# Patient Record
Sex: Male | Born: 1970 | Race: White | Hispanic: No | Marital: Single | Smoking: Never smoker
Health system: Southern US, Community
[De-identification: ages and names within clinical notes are randomized; demographics above are authoritative.]

## PROBLEM LIST (undated history)

## (undated) DIAGNOSIS — I10 Essential (primary) hypertension: Secondary | ICD-10-CM

## (undated) DIAGNOSIS — I2510 Atherosclerotic heart disease of native coronary artery without angina pectoris: Secondary | ICD-10-CM

## (undated) DIAGNOSIS — E119 Type 2 diabetes mellitus without complications: Secondary | ICD-10-CM

## (undated) DIAGNOSIS — F32A Depression, unspecified: Secondary | ICD-10-CM

## (undated) DIAGNOSIS — I509 Heart failure, unspecified: Secondary | ICD-10-CM

## (undated) DIAGNOSIS — F419 Anxiety disorder, unspecified: Secondary | ICD-10-CM

---

## 1999-02-06 ENCOUNTER — Emergency Department (HOSPITAL_COMMUNITY): Admission: EM | Admit: 1999-02-06 | Discharge: 1999-02-06 | Payer: Self-pay | Admitting: Emergency Medicine

## 1999-02-08 ENCOUNTER — Encounter: Payer: Self-pay | Admitting: Emergency Medicine

## 1999-02-08 ENCOUNTER — Emergency Department (HOSPITAL_COMMUNITY): Admission: EM | Admit: 1999-02-08 | Discharge: 1999-02-08 | Payer: Self-pay | Admitting: Emergency Medicine

## 1999-03-20 ENCOUNTER — Emergency Department (HOSPITAL_COMMUNITY): Admission: EM | Admit: 1999-03-20 | Discharge: 1999-03-20 | Payer: Self-pay | Admitting: Emergency Medicine

## 1999-07-08 ENCOUNTER — Emergency Department (HOSPITAL_COMMUNITY): Admission: EM | Admit: 1999-07-08 | Discharge: 1999-07-09 | Payer: Self-pay | Admitting: Emergency Medicine

## 1999-08-06 ENCOUNTER — Emergency Department (HOSPITAL_COMMUNITY): Admission: EM | Admit: 1999-08-06 | Discharge: 1999-08-06 | Payer: Self-pay | Admitting: Emergency Medicine

## 2000-12-22 ENCOUNTER — Encounter: Payer: Self-pay | Admitting: Emergency Medicine

## 2000-12-22 ENCOUNTER — Emergency Department (HOSPITAL_COMMUNITY): Admission: EM | Admit: 2000-12-22 | Discharge: 2000-12-22 | Payer: Self-pay | Admitting: *Deleted

## 2001-09-03 ENCOUNTER — Emergency Department (HOSPITAL_COMMUNITY): Admission: EM | Admit: 2001-09-03 | Discharge: 2001-09-03 | Payer: Self-pay

## 2001-11-23 ENCOUNTER — Emergency Department (HOSPITAL_COMMUNITY): Admission: EM | Admit: 2001-11-23 | Discharge: 2001-11-23 | Payer: Self-pay | Admitting: Emergency Medicine

## 2001-11-23 ENCOUNTER — Encounter: Payer: Self-pay | Admitting: Emergency Medicine

## 2002-04-25 ENCOUNTER — Emergency Department (HOSPITAL_COMMUNITY): Admission: EM | Admit: 2002-04-25 | Discharge: 2002-04-25 | Payer: Self-pay | Admitting: Emergency Medicine

## 2002-04-25 ENCOUNTER — Encounter: Payer: Self-pay | Admitting: Emergency Medicine

## 2002-07-26 ENCOUNTER — Emergency Department (HOSPITAL_COMMUNITY): Admission: EM | Admit: 2002-07-26 | Discharge: 2002-07-26 | Payer: Self-pay | Admitting: Emergency Medicine

## 2002-09-09 ENCOUNTER — Emergency Department (HOSPITAL_COMMUNITY): Admission: EM | Admit: 2002-09-09 | Discharge: 2002-09-09 | Payer: Self-pay | Admitting: Emergency Medicine

## 2002-09-09 ENCOUNTER — Encounter: Payer: Self-pay | Admitting: Emergency Medicine

## 2004-04-18 ENCOUNTER — Emergency Department (HOSPITAL_COMMUNITY): Admission: EM | Admit: 2004-04-18 | Discharge: 2004-04-18 | Payer: Self-pay | Admitting: *Deleted

## 2005-12-01 ENCOUNTER — Emergency Department (HOSPITAL_COMMUNITY): Admission: EM | Admit: 2005-12-01 | Discharge: 2005-12-01 | Payer: Self-pay | Admitting: Emergency Medicine

## 2006-06-21 ENCOUNTER — Emergency Department (HOSPITAL_COMMUNITY): Admission: EM | Admit: 2006-06-21 | Discharge: 2006-06-21 | Payer: Self-pay | Admitting: *Deleted

## 2006-06-26 ENCOUNTER — Emergency Department (HOSPITAL_COMMUNITY): Admission: EM | Admit: 2006-06-26 | Discharge: 2006-06-26 | Payer: Self-pay | Admitting: Emergency Medicine

## 2006-07-01 ENCOUNTER — Emergency Department (HOSPITAL_COMMUNITY): Admission: EM | Admit: 2006-07-01 | Discharge: 2006-07-02 | Payer: Self-pay | Admitting: Emergency Medicine

## 2006-10-08 ENCOUNTER — Emergency Department (HOSPITAL_COMMUNITY): Admission: EM | Admit: 2006-10-08 | Discharge: 2006-10-08 | Payer: Self-pay | Admitting: Emergency Medicine

## 2007-04-24 ENCOUNTER — Emergency Department (HOSPITAL_COMMUNITY): Admission: EM | Admit: 2007-04-24 | Discharge: 2007-04-24 | Payer: Self-pay | Admitting: *Deleted

## 2007-08-24 ENCOUNTER — Emergency Department (HOSPITAL_COMMUNITY): Admission: EM | Admit: 2007-08-24 | Discharge: 2007-08-24 | Payer: Self-pay | Admitting: Emergency Medicine

## 2007-08-31 ENCOUNTER — Emergency Department (HOSPITAL_COMMUNITY): Admission: EM | Admit: 2007-08-31 | Discharge: 2007-08-31 | Payer: Self-pay | Admitting: Emergency Medicine

## 2008-10-03 ENCOUNTER — Emergency Department (HOSPITAL_COMMUNITY): Admission: EM | Admit: 2008-10-03 | Discharge: 2008-10-03 | Payer: Self-pay | Admitting: Emergency Medicine

## 2008-10-08 ENCOUNTER — Emergency Department (HOSPITAL_COMMUNITY): Admission: EM | Admit: 2008-10-08 | Discharge: 2008-10-08 | Payer: Self-pay | Admitting: Emergency Medicine

## 2008-10-21 ENCOUNTER — Emergency Department (HOSPITAL_COMMUNITY): Admission: EM | Admit: 2008-10-21 | Discharge: 2008-10-21 | Payer: Self-pay | Admitting: Emergency Medicine

## 2008-11-11 ENCOUNTER — Emergency Department (HOSPITAL_COMMUNITY): Admission: EM | Admit: 2008-11-11 | Discharge: 2008-11-11 | Payer: Self-pay | Admitting: Emergency Medicine

## 2008-11-18 ENCOUNTER — Emergency Department (HOSPITAL_COMMUNITY): Admission: EM | Admit: 2008-11-18 | Discharge: 2008-11-19 | Payer: Self-pay | Admitting: Emergency Medicine

## 2009-04-03 ENCOUNTER — Emergency Department (HOSPITAL_COMMUNITY): Admission: EM | Admit: 2009-04-03 | Discharge: 2009-04-03 | Payer: Self-pay | Admitting: Emergency Medicine

## 2009-05-16 ENCOUNTER — Emergency Department (HOSPITAL_COMMUNITY): Admission: EM | Admit: 2009-05-16 | Discharge: 2009-05-17 | Payer: Self-pay | Admitting: Emergency Medicine

## 2009-06-22 ENCOUNTER — Emergency Department (HOSPITAL_COMMUNITY): Admission: EM | Admit: 2009-06-22 | Discharge: 2009-06-22 | Payer: Self-pay | Admitting: Emergency Medicine

## 2009-06-23 ENCOUNTER — Emergency Department (HOSPITAL_COMMUNITY): Admission: EM | Admit: 2009-06-23 | Discharge: 2009-06-23 | Payer: Self-pay | Admitting: Emergency Medicine

## 2009-07-22 ENCOUNTER — Emergency Department (HOSPITAL_COMMUNITY): Admission: EM | Admit: 2009-07-22 | Discharge: 2009-07-22 | Payer: Self-pay | Admitting: Emergency Medicine

## 2009-08-02 ENCOUNTER — Inpatient Hospital Stay (HOSPITAL_COMMUNITY): Admission: EM | Admit: 2009-08-02 | Discharge: 2009-08-05 | Payer: Self-pay | Admitting: Emergency Medicine

## 2009-08-03 ENCOUNTER — Encounter (INDEPENDENT_AMBULATORY_CARE_PROVIDER_SITE_OTHER): Payer: Self-pay | Admitting: Family Medicine

## 2009-09-07 ENCOUNTER — Ambulatory Visit: Payer: Self-pay | Admitting: Family Medicine

## 2009-09-07 DIAGNOSIS — F411 Generalized anxiety disorder: Secondary | ICD-10-CM

## 2009-09-07 DIAGNOSIS — F329 Major depressive disorder, single episode, unspecified: Secondary | ICD-10-CM

## 2009-09-07 DIAGNOSIS — F419 Anxiety disorder, unspecified: Secondary | ICD-10-CM | POA: Insufficient documentation

## 2009-09-07 DIAGNOSIS — E1159 Type 2 diabetes mellitus with other circulatory complications: Secondary | ICD-10-CM | POA: Insufficient documentation

## 2009-09-07 DIAGNOSIS — J45909 Unspecified asthma, uncomplicated: Secondary | ICD-10-CM | POA: Insufficient documentation

## 2009-09-07 DIAGNOSIS — E876 Hypokalemia: Secondary | ICD-10-CM | POA: Insufficient documentation

## 2009-09-07 DIAGNOSIS — F32A Depression, unspecified: Secondary | ICD-10-CM | POA: Insufficient documentation

## 2009-09-07 DIAGNOSIS — E119 Type 2 diabetes mellitus without complications: Secondary | ICD-10-CM

## 2009-09-07 DIAGNOSIS — I10 Essential (primary) hypertension: Secondary | ICD-10-CM | POA: Insufficient documentation

## 2009-09-07 DIAGNOSIS — R197 Diarrhea, unspecified: Secondary | ICD-10-CM | POA: Insufficient documentation

## 2009-09-21 ENCOUNTER — Ambulatory Visit: Payer: Self-pay | Admitting: Family Medicine

## 2009-09-21 DIAGNOSIS — I517 Cardiomegaly: Secondary | ICD-10-CM

## 2009-09-21 LAB — CONVERTED CEMR LAB
AST: 16 units/L (ref 0–37)
BUN: 11 mg/dL (ref 6–23)
Cholesterol: 199 mg/dL (ref 0–200)
Creatinine, Ser: 0.84 mg/dL (ref 0.40–1.50)
Eosinophils Relative: 2 % (ref 0–5)
Glucose, Bld: 257 mg/dL — ABNORMAL HIGH (ref 70–99)
HCT: 48.4 % (ref 39.0–52.0)
Hemoglobin: 16.4 g/dL (ref 13.0–17.0)
LDL Cholesterol: 87 mg/dL (ref 0–99)
Lymphocytes Relative: 38 % (ref 12–46)
MCHC: 33.9 g/dL (ref 30.0–36.0)
MCV: 91.7 fL (ref 78.0–100.0)
Neutrophils Relative %: 54 % (ref 43–77)
Platelets: 290 10*3/uL (ref 150–400)
Potassium: 4.2 meq/L (ref 3.5–5.3)
RBC: 5.28 M/uL (ref 4.22–5.81)
Sodium: 138 meq/L (ref 135–145)
TSH: 1.787 microintl units/mL (ref 0.350–4.500)
Total Bilirubin: 0.6 mg/dL (ref 0.3–1.2)
Total CHOL/HDL Ratio: 5.5
Triglycerides: 382 mg/dL — ABNORMAL HIGH (ref <150)
VLDL: 76 mg/dL — ABNORMAL HIGH (ref 0–40)
Vit D, 25-Hydroxy: 15 ng/mL — ABNORMAL LOW (ref 30–89)

## 2009-09-22 ENCOUNTER — Ambulatory Visit: Payer: Self-pay | Admitting: Family Medicine

## 2009-09-24 ENCOUNTER — Ambulatory Visit: Payer: Self-pay | Admitting: Family Medicine

## 2009-09-28 ENCOUNTER — Encounter (INDEPENDENT_AMBULATORY_CARE_PROVIDER_SITE_OTHER): Payer: Self-pay | Admitting: Family Medicine

## 2009-10-01 ENCOUNTER — Ambulatory Visit (HOSPITAL_COMMUNITY): Admission: RE | Admit: 2009-10-01 | Discharge: 2009-10-01 | Payer: Self-pay | Admitting: Family Medicine

## 2009-10-01 ENCOUNTER — Encounter (INDEPENDENT_AMBULATORY_CARE_PROVIDER_SITE_OTHER): Payer: Self-pay | Admitting: Family Medicine

## 2009-10-26 ENCOUNTER — Emergency Department (HOSPITAL_COMMUNITY): Admission: EM | Admit: 2009-10-26 | Discharge: 2009-10-26 | Payer: Self-pay | Admitting: Emergency Medicine

## 2009-10-27 ENCOUNTER — Ambulatory Visit: Payer: Self-pay | Admitting: Family Medicine

## 2009-11-18 ENCOUNTER — Encounter (INDEPENDENT_AMBULATORY_CARE_PROVIDER_SITE_OTHER): Payer: Self-pay | Admitting: Family Medicine

## 2009-12-07 ENCOUNTER — Emergency Department (HOSPITAL_COMMUNITY): Admission: EM | Admit: 2009-12-07 | Discharge: 2009-12-07 | Payer: Self-pay | Admitting: Emergency Medicine

## 2009-12-26 ENCOUNTER — Emergency Department (HOSPITAL_COMMUNITY): Admission: EM | Admit: 2009-12-26 | Discharge: 2009-12-26 | Payer: Self-pay | Admitting: Family Medicine

## 2010-04-17 ENCOUNTER — Emergency Department (HOSPITAL_COMMUNITY): Admission: EM | Admit: 2010-04-17 | Discharge: 2010-04-18 | Payer: Self-pay | Admitting: Emergency Medicine

## 2010-04-22 ENCOUNTER — Emergency Department (HOSPITAL_COMMUNITY): Admission: EM | Admit: 2010-04-22 | Discharge: 2010-04-22 | Payer: Self-pay | Admitting: Emergency Medicine

## 2010-08-06 ENCOUNTER — Emergency Department (HOSPITAL_COMMUNITY): Admission: EM | Admit: 2010-08-06 | Discharge: 2010-08-07 | Payer: Self-pay | Admitting: Emergency Medicine

## 2010-08-16 ENCOUNTER — Emergency Department (HOSPITAL_COMMUNITY): Admission: EM | Admit: 2010-08-16 | Discharge: 2010-08-17 | Payer: Self-pay | Admitting: Emergency Medicine

## 2010-10-26 ENCOUNTER — Emergency Department (HOSPITAL_COMMUNITY)
Admission: EM | Admit: 2010-10-26 | Discharge: 2010-10-26 | Payer: Self-pay | Source: Home / Self Care | Admitting: Emergency Medicine

## 2010-10-27 LAB — COMPREHENSIVE METABOLIC PANEL
ALT: 23 U/L (ref 0–53)
AST: 18 U/L (ref 0–37)
Albumin: 3.6 g/dL (ref 3.5–5.2)
BUN: 8 mg/dL (ref 6–23)
CO2: 27 mEq/L (ref 19–32)
Chloride: 101 mEq/L (ref 96–112)
GFR calc Af Amer: >60 mL/min (ref >60)
GFR calc non Af Amer: >60 mL/min (ref >60)
Glucose, Bld: 246 mg/dL — ABNORMAL HIGH (ref 70–99)

## 2010-10-27 LAB — CBC
HCT: 41.1 % (ref 39.0–52.0)
Hemoglobin: 13.9 g/dL (ref 13.0–17.0)
RBC: 4.59 MIL/uL (ref 4.22–5.81)
WBC: 7.2 10*3/uL (ref 4.0–10.5)

## 2010-11-02 NOTE — Letter (Signed)
Summary: DISCAHRGED FROM PRACTICE//LETTER//  DISCAHRGED FROM PRACTICE//LETTER//   Imported By: Arta Bruce 12/17/2009 11:03:24  _____________________________________________________________________  External Attachment:    Type:   Image     Comment:   External Document

## 2010-11-02 NOTE — Progress Notes (Signed)
Summary: Office Visit/DEPRESSION SCREENING  Office Visit/DEPRESSION SCREENING   Imported By: Arta Bruce 10/29/2009 12:50:41  _____________________________________________________________________  External Attachment:    Type:   Image     Comment:   External Document

## 2010-11-02 NOTE — Letter (Signed)
Summary: Discharge Summary  Discharge Summary   Imported By: Arta Bruce 10/23/2009 12:55:21  _____________________________________________________________________  External Attachment:    Type:   Image     Comment:   External Document

## 2010-12-14 LAB — GLUCOSE, CAPILLARY
Glucose-Capillary: 281 mg/dL — ABNORMAL HIGH (ref 70–99)
Glucose-Capillary: 311 mg/dL — ABNORMAL HIGH (ref 70–99)

## 2010-12-14 LAB — CBC
MCH: 31.9 pg (ref 26.0–34.0)
MCHC: 34.5 g/dL (ref 30.0–36.0)
MCV: 92.4 fL (ref 78.0–100.0)
RBC: 4.29 MIL/uL (ref 4.22–5.81)
RDW: 12.8 % (ref 11.5–15.5)
WBC: 7.7 10*3/uL (ref 4.0–10.5)

## 2010-12-14 LAB — URINALYSIS, ROUTINE W REFLEX MICROSCOPIC
Bilirubin Urine: NEGATIVE
Leukocytes, UA: NEGATIVE
Protein, ur: NEGATIVE mg/dL
Specific Gravity, Urine: 1.033 — ABNORMAL HIGH (ref 1.005–1.030)
Urobilinogen, UA: 0.2 mg/dL (ref 0.0–1.0)

## 2010-12-14 LAB — POCT I-STAT, CHEM 8
BUN: 10 mg/dL (ref 6–23)
Calcium, Ion: 1.15 mmol/L (ref 1.12–1.32)
Chloride: 101 mEq/L (ref 96–112)
Creatinine, Ser: 0.8 mg/dL (ref 0.4–1.5)
Glucose, Bld: 274 mg/dL — ABNORMAL HIGH (ref 70–99)
HCT: 41 % (ref 39.0–52.0)
Hemoglobin: 13.9 g/dL (ref 13.0–17.0)
Potassium: 3.8 mEq/L (ref 3.5–5.1)
Sodium: 136 meq/L (ref 135–145)
TCO2: 27 mmol/L (ref 0–100)

## 2010-12-14 LAB — DIFFERENTIAL
Basophils Absolute: 0 10*3/uL (ref 0.0–0.1)
Basophils Relative: 1 % (ref 0–1)
Eosinophils Absolute: 0.2 10*3/uL (ref 0.0–0.7)
Eosinophils Relative: 2 % (ref 0–5)
Monocytes Absolute: 0.6 10*3/uL (ref 0.1–1.0)

## 2010-12-14 LAB — URINE MICROSCOPIC-ADD ON

## 2010-12-18 LAB — URINALYSIS, ROUTINE W REFLEX MICROSCOPIC
Bilirubin Urine: NEGATIVE
Glucose, UA: >1000 mg/dL — AB
Hgb urine dipstick: NEGATIVE
Specific Gravity, Urine: 1.034 — ABNORMAL HIGH (ref 1.005–1.030)
Urobilinogen, UA: 0.2 mg/dL (ref 0.0–1.0)

## 2010-12-18 LAB — GLUCOSE, CAPILLARY
Glucose-Capillary: 222 mg/dL — ABNORMAL HIGH (ref 70–99)
Glucose-Capillary: 294 mg/dL — ABNORMAL HIGH (ref 70–99)
Glucose-Capillary: 342 mg/dL — ABNORMAL HIGH (ref 70–99)

## 2010-12-18 LAB — BLOOD GAS, VENOUS
Acid-base deficit: 1.1 mmol/L (ref 0.0–2.0)
Bicarbonate: 24 mEq/L (ref 20.0–24.0)
TCO2: 21.2 mmol/L (ref 0–100)
pCO2, Ven: 43.6 mmHg — ABNORMAL LOW (ref 45.0–50.0)
pH, Ven: 7.36 — ABNORMAL HIGH (ref 7.250–7.300)
pO2, Ven: 74.6 mmHg — ABNORMAL HIGH (ref 30.0–45.0)

## 2010-12-18 LAB — POCT I-STAT, CHEM 8
BUN: 13 mg/dL (ref 6–23)
Calcium, Ion: 1.16 mmol/L (ref 1.12–1.32)
Chloride: 104 mEq/L (ref 96–112)
Creatinine, Ser: 0.6 mg/dL (ref 0.4–1.5)
Hemoglobin: 15.3 g/dL (ref 13.0–17.0)
Potassium: 4.1 mEq/L (ref 3.5–5.1)
Sodium: 135 mEq/L (ref 135–145)

## 2010-12-18 LAB — URINE MICROSCOPIC-ADD ON

## 2010-12-19 LAB — RAPID STREP SCREEN (MED CTR MEBANE ONLY): Streptococcus, Group A Screen (Direct): NEGATIVE

## 2010-12-26 LAB — POCT URINALYSIS DIP (DEVICE)
Hgb urine dipstick: NEGATIVE
Nitrite: NEGATIVE
Protein, ur: NEGATIVE mg/dL
Specific Gravity, Urine: 1.025 (ref 1.005–1.030)
Urobilinogen, UA: 0.2 mg/dL (ref 0.0–1.0)

## 2011-01-05 LAB — BASIC METABOLIC PANEL
BUN: 2 mg/dL — ABNORMAL LOW (ref 6–23)
CO2: 24 mEq/L (ref 19–32)
CO2: 27 mEq/L (ref 19–32)
Calcium: 8.1 mg/dL — ABNORMAL LOW (ref 8.4–10.5)
Calcium: 8.4 mg/dL (ref 8.4–10.5)
Calcium: 8.7 mg/dL (ref 8.4–10.5)
Creatinine, Ser: 0.68 mg/dL (ref 0.4–1.5)
GFR calc Af Amer: >60 mL/min (ref >60)
GFR calc Af Amer: >60 mL/min (ref >60)
GFR calc non Af Amer: >60 mL/min (ref >60)
GFR calc non Af Amer: >60 mL/min (ref >60)
Glucose, Bld: 143 mg/dL — ABNORMAL HIGH (ref 70–99)
Glucose, Bld: 156 mg/dL — ABNORMAL HIGH (ref 70–99)
Potassium: 3.4 mEq/L — ABNORMAL LOW (ref 3.5–5.1)
Sodium: 138 mEq/L (ref 135–145)
Sodium: 139 mEq/L (ref 135–145)

## 2011-01-05 LAB — CLOSTRIDIUM DIFFICILE EIA

## 2011-01-05 LAB — HEMOGLOBIN A1C
Hgb A1c MFr Bld: 8.6 % — ABNORMAL HIGH (ref 4.6–6.1)
Mean Plasma Glucose: 200 mg/dL

## 2011-01-05 LAB — GLUCOSE, CAPILLARY
Glucose-Capillary: 135 mg/dL — ABNORMAL HIGH (ref 70–99)
Glucose-Capillary: 147 mg/dL — ABNORMAL HIGH (ref 70–99)
Glucose-Capillary: 149 mg/dL — ABNORMAL HIGH (ref 70–99)
Glucose-Capillary: 152 mg/dL — ABNORMAL HIGH (ref 70–99)
Glucose-Capillary: 182 mg/dL — ABNORMAL HIGH (ref 70–99)
Glucose-Capillary: 190 mg/dL — ABNORMAL HIGH (ref 70–99)

## 2011-01-05 LAB — DIFFERENTIAL
Basophils Absolute: 0 10*3/uL (ref 0.0–0.1)
Basophils Relative: 0 % (ref 0–1)
Lymphocytes Relative: 25 % (ref 12–46)
Neutro Abs: 4.6 10*3/uL (ref 1.7–7.7)
Neutrophils Relative %: 63 % (ref 43–77)

## 2011-01-05 LAB — CBC
MCHC: 34 g/dL (ref 30.0–36.0)
RDW: 12.8 % (ref 11.5–15.5)

## 2011-01-05 LAB — TSH: TSH: 2.335 u[IU]/mL (ref 0.350–4.500)

## 2011-01-06 LAB — URINALYSIS, ROUTINE W REFLEX MICROSCOPIC
Glucose, UA: >1000 mg/dL — AB
Hgb urine dipstick: NEGATIVE
Leukocytes, UA: NEGATIVE
Protein, ur: NEGATIVE mg/dL
pH: 6 (ref 5.0–8.0)

## 2011-01-06 LAB — CBC
MCHC: 34.4 g/dL (ref 30.0–36.0)
MCHC: 35.1 g/dL (ref 30.0–36.0)
Platelets: 245 10*3/uL (ref 150–400)
RBC: 4.56 MIL/uL (ref 4.22–5.81)
RDW: 12.5 % (ref 11.5–15.5)

## 2011-01-06 LAB — CARDIAC PANEL(CRET KIN+CKTOT+MB+TROPI)
CK, MB: 0.7 ng/mL (ref 0.3–4.0)
CK, MB: 0.7 ng/mL (ref 0.3–4.0)
Relative Index: INVALID (ref 0.0–2.5)
Total CK: 35 U/L (ref 7–232)
Total CK: 41 U/L (ref 7–232)
Troponin I: 0.01 ng/mL (ref 0.00–0.06)
Troponin I: <0.01 ng/mL (ref 0.00–0.06)

## 2011-01-06 LAB — GLUCOSE, CAPILLARY
Glucose-Capillary: 158 mg/dL — ABNORMAL HIGH (ref 70–99)
Glucose-Capillary: 207 mg/dL — ABNORMAL HIGH (ref 70–99)
Glucose-Capillary: 242 mg/dL — ABNORMAL HIGH (ref 70–99)
Glucose-Capillary: 272 mg/dL — ABNORMAL HIGH (ref 70–99)

## 2011-01-06 LAB — POCT CARDIAC MARKERS: Troponin i, poc: <0.05 ng/mL (ref 0.00–0.09)

## 2011-01-06 LAB — CLOSTRIDIUM DIFFICILE EIA: C difficile Toxins A+B, EIA: NEGATIVE

## 2011-01-06 LAB — COMPREHENSIVE METABOLIC PANEL
ALT: 26 U/L (ref 0–53)
ALT: 30 U/L (ref 0–53)
AST: 23 U/L (ref 0–37)
Albumin: 3.5 g/dL (ref 3.5–5.2)
Albumin: 3.7 g/dL (ref 3.5–5.2)
Alkaline Phosphatase: 77 U/L (ref 39–117)
BUN: 13 mg/dL (ref 6–23)
CO2: 28 mEq/L (ref 19–32)
Calcium: 8.7 mg/dL (ref 8.4–10.5)
Calcium: 9.1 mg/dL (ref 8.4–10.5)
GFR calc Af Amer: >60 mL/min (ref >60)
Potassium: 3.1 mEq/L — ABNORMAL LOW (ref 3.5–5.1)
Sodium: 136 mEq/L (ref 135–145)
Sodium: 137 mEq/L (ref 135–145)
Total Protein: 6.6 g/dL (ref 6.0–8.3)
Total Protein: 6.8 g/dL (ref 6.0–8.3)

## 2011-01-06 LAB — BASIC METABOLIC PANEL
BUN: 14 mg/dL (ref 6–23)
Calcium: 7.8 mg/dL — ABNORMAL LOW (ref 8.4–10.5)
GFR calc non Af Amer: >60 mL/min (ref >60)
Glucose, Bld: 262 mg/dL — ABNORMAL HIGH (ref 70–99)
Potassium: 3.9 mEq/L (ref 3.5–5.1)
Sodium: 136 mEq/L (ref 135–145)

## 2011-01-06 LAB — CULTURE, BLOOD (ROUTINE X 2): Culture: NO GROWTH

## 2011-01-06 LAB — RAPID URINE DRUG SCREEN, HOSP PERFORMED
Barbiturates: NOT DETECTED
Cocaine: NOT DETECTED
Opiates: NOT DETECTED

## 2011-01-06 LAB — URINE MICROSCOPIC-ADD ON

## 2011-01-06 LAB — DIFFERENTIAL
Basophils Relative: 0 % (ref 0–1)
Eosinophils Absolute: 0.2 10*3/uL (ref 0.0–0.7)
Eosinophils Relative: 2 % (ref 0–5)
Lymphs Abs: 0.8 10*3/uL (ref 0.7–4.0)
Lymphs Abs: 3 10*3/uL (ref 0.7–4.0)
Monocytes Absolute: 0.6 10*3/uL (ref 0.1–1.0)
Monocytes Absolute: 1.1 10*3/uL — ABNORMAL HIGH (ref 0.1–1.0)
Monocytes Relative: 11 % (ref 3–12)
Monocytes Relative: 8 % (ref 3–12)
Neutro Abs: 7.7 10*3/uL (ref 1.7–7.7)

## 2011-01-06 LAB — URINE CULTURE: Culture: NO GROWTH

## 2011-01-06 LAB — MAGNESIUM: Magnesium: 1.8 mg/dL (ref 1.5–2.5)

## 2011-01-06 LAB — TROPONIN I: Troponin I: 0.01 ng/mL (ref 0.00–0.06)

## 2011-01-06 LAB — CK TOTAL AND CKMB (NOT AT ARMC)
CK, MB: 0.4 ng/mL (ref 0.3–4.0)
Total CK: 36 U/L (ref 7–232)

## 2011-01-06 LAB — STOOL CULTURE

## 2011-01-07 LAB — GLUCOSE, CAPILLARY: Glucose-Capillary: 261 mg/dL — ABNORMAL HIGH (ref 70–99)

## 2011-01-07 LAB — COMPREHENSIVE METABOLIC PANEL
AST: 24 U/L (ref 0–37)
BUN: 12 mg/dL (ref 6–23)
Chloride: 103 mEq/L (ref 96–112)
GFR calc non Af Amer: >60 mL/min (ref >60)
Potassium: 3.8 mEq/L (ref 3.5–5.1)
Sodium: 135 mEq/L (ref 135–145)

## 2011-01-07 LAB — CBC
HCT: 41.4 % (ref 39.0–52.0)
Hemoglobin: 14.5 g/dL (ref 13.0–17.0)
Platelets: 216 10*3/uL (ref 150–400)
RDW: 12 % (ref 11.5–15.5)
WBC: 7.4 10*3/uL (ref 4.0–10.5)

## 2011-01-07 LAB — DIFFERENTIAL
Lymphocytes Relative: 29 % (ref 12–46)
Lymphs Abs: 2.2 10*3/uL (ref 0.7–4.0)
Neutro Abs: 4.7 10*3/uL (ref 1.7–7.7)
Neutrophils Relative %: 63 % (ref 43–77)

## 2011-01-07 LAB — URINALYSIS, ROUTINE W REFLEX MICROSCOPIC
Ketones, ur: 15 mg/dL — AB
Leukocytes, UA: NEGATIVE
Protein, ur: NEGATIVE mg/dL
Urobilinogen, UA: 0.2 mg/dL (ref 0.0–1.0)

## 2011-01-07 LAB — URINE MICROSCOPIC-ADD ON: Urine-Other: NONE SEEN

## 2011-01-07 LAB — ETHANOL: Alcohol, Ethyl (B): <5 mg/dL (ref 0–10)

## 2011-01-07 LAB — RAPID URINE DRUG SCREEN, HOSP PERFORMED
Benzodiazepines: NOT DETECTED
Cocaine: NOT DETECTED
Tetrahydrocannabinol: NOT DETECTED

## 2011-01-08 LAB — POCT I-STAT, CHEM 8
BUN: 8 mg/dL (ref 6–23)
Calcium, Ion: 1.21 mmol/L (ref 1.12–1.32)
Chloride: 103 mEq/L (ref 96–112)
Potassium: 3.5 mEq/L (ref 3.5–5.1)

## 2011-01-08 LAB — URINALYSIS, ROUTINE W REFLEX MICROSCOPIC
Nitrite: NEGATIVE
Specific Gravity, Urine: 1.023 (ref 1.005–1.030)
pH: 6 (ref 5.0–8.0)

## 2011-01-08 LAB — DIFFERENTIAL
Basophils Absolute: 0 10*3/uL (ref 0.0–0.1)
Lymphocytes Relative: 37 % (ref 12–46)
Monocytes Absolute: 0.6 10*3/uL (ref 0.1–1.0)
Monocytes Relative: 7 % (ref 3–12)
Neutro Abs: 4.7 10*3/uL (ref 1.7–7.7)
Neutrophils Relative %: 54 % (ref 43–77)

## 2011-01-08 LAB — CBC
Hemoglobin: 15.1 g/dL (ref 13.0–17.0)
RBC: 4.71 MIL/uL (ref 4.22–5.81)

## 2011-01-08 LAB — GLUCOSE, CAPILLARY: Glucose-Capillary: 250 mg/dL — ABNORMAL HIGH (ref 70–99)

## 2011-01-09 LAB — DIFFERENTIAL
Basophils Relative: 0 % (ref 0–1)
Eosinophils Absolute: 0.1 10*3/uL (ref 0.0–0.7)
Eosinophils Relative: 1 % (ref 0–5)
Lymphs Abs: 2.5 10*3/uL (ref 0.7–4.0)
Neutrophils Relative %: 64 % (ref 43–77)

## 2011-01-09 LAB — CBC
Hemoglobin: 15.1 g/dL (ref 13.0–17.0)
MCHC: 35 g/dL (ref 30.0–36.0)
RBC: 4.68 MIL/uL (ref 4.22–5.81)
WBC: 8.8 10*3/uL (ref 4.0–10.5)

## 2011-01-09 LAB — COMPREHENSIVE METABOLIC PANEL
ALT: 28 U/L (ref 0–53)
AST: 23 U/L (ref 0–37)
Alkaline Phosphatase: 75 U/L (ref 39–117)
CO2: 29 mEq/L (ref 19–32)
Chloride: 96 mEq/L (ref 96–112)
GFR calc Af Amer: >60 mL/min (ref >60)
GFR calc non Af Amer: >60 mL/min (ref >60)
Glucose, Bld: 197 mg/dL — ABNORMAL HIGH (ref 70–99)
Potassium: 3.9 mEq/L (ref 3.5–5.1)
Sodium: 134 mEq/L — ABNORMAL LOW (ref 135–145)
Total Bilirubin: 1.1 mg/dL (ref 0.3–1.2)

## 2011-01-09 LAB — GLUCOSE, CAPILLARY: Glucose-Capillary: 209 mg/dL — ABNORMAL HIGH (ref 70–99)

## 2011-01-09 LAB — URINALYSIS, ROUTINE W REFLEX MICROSCOPIC
Bilirubin Urine: NEGATIVE
Nitrite: NEGATIVE
Specific Gravity, Urine: 1.013 (ref 1.005–1.030)
Urobilinogen, UA: 0.2 mg/dL (ref 0.0–1.0)
pH: 7 (ref 5.0–8.0)

## 2011-01-09 LAB — LIPASE, BLOOD: Lipase: 22 U/L (ref 11–59)

## 2011-01-17 LAB — COMPREHENSIVE METABOLIC PANEL
Albumin: 4.2 g/dL (ref 3.5–5.2)
BUN: 12 mg/dL (ref 6–23)
Calcium: 9.5 mg/dL (ref 8.4–10.5)
Creatinine, Ser: 0.77 mg/dL (ref 0.4–1.5)
Glucose, Bld: 228 mg/dL — ABNORMAL HIGH (ref 70–99)
Total Protein: 7.4 g/dL (ref 6.0–8.3)

## 2011-01-17 LAB — URINALYSIS, ROUTINE W REFLEX MICROSCOPIC
Glucose, UA: NEGATIVE mg/dL
Hgb urine dipstick: NEGATIVE
Specific Gravity, Urine: 1.024 (ref 1.005–1.030)
Urobilinogen, UA: 0.2 mg/dL (ref 0.0–1.0)
pH: 5.5 (ref 5.0–8.0)

## 2011-01-17 LAB — DIFFERENTIAL
Basophils Relative: 1 % (ref 0–1)
Lymphocytes Relative: 33 % (ref 12–46)
Lymphs Abs: 2.5 10*3/uL (ref 0.7–4.0)
Monocytes Relative: 7 % (ref 3–12)
Neutro Abs: 4.2 10*3/uL (ref 1.7–7.7)
Neutrophils Relative %: 57 % (ref 43–77)

## 2011-01-17 LAB — RAPID STREP SCREEN (MED CTR MEBANE ONLY): Streptococcus, Group A Screen (Direct): NEGATIVE

## 2011-01-17 LAB — GLUCOSE, CAPILLARY
Glucose-Capillary: 276 mg/dL — ABNORMAL HIGH (ref 70–99)
Glucose-Capillary: 224 mg/dL — ABNORMAL HIGH (ref 70–99)

## 2011-01-17 LAB — CBC
MCHC: 34.1 g/dL (ref 30.0–36.0)
MCV: 92.4 fL (ref 78.0–100.0)
Platelets: 259 10*3/uL (ref 150–400)
RBC: 5.03 MIL/uL (ref 4.22–5.81)
RDW: 12.3 % (ref 11.5–15.5)
WBC: 7.4 10*3/uL (ref 4.0–10.5)

## 2011-01-18 LAB — LIPASE, BLOOD: Lipase: 33 U/L (ref 11–59)

## 2011-01-18 LAB — COMPREHENSIVE METABOLIC PANEL
ALT: 36 U/L (ref 0–53)
AST: 25 U/L (ref 0–37)
CO2: 28 mEq/L (ref 19–32)
Chloride: 104 mEq/L (ref 96–112)
Creatinine, Ser: 0.84 mg/dL (ref 0.4–1.5)
GFR calc Af Amer: >60 mL/min (ref >60)
GFR calc non Af Amer: >60 mL/min (ref >60)
Total Bilirubin: 0.5 mg/dL (ref 0.3–1.2)

## 2011-01-18 LAB — DIFFERENTIAL
Basophils Absolute: 0.1 10*3/uL (ref 0.0–0.1)
Eosinophils Absolute: 0.2 10*3/uL (ref 0.0–0.7)
Eosinophils Relative: 3 % (ref 0–5)

## 2011-01-18 LAB — CBC
Hemoglobin: 14.6 g/dL (ref 13.0–17.0)
MCV: 91.9 fL (ref 78.0–100.0)
RBC: 4.65 MIL/uL (ref 4.22–5.81)
WBC: 7.2 10*3/uL (ref 4.0–10.5)

## 2011-01-18 LAB — URINALYSIS, ROUTINE W REFLEX MICROSCOPIC
Bilirubin Urine: NEGATIVE
Nitrite: NEGATIVE
Urobilinogen, UA: 1 mg/dL (ref 0.0–1.0)

## 2011-03-11 ENCOUNTER — Emergency Department (HOSPITAL_COMMUNITY)
Admission: EM | Admit: 2011-03-11 | Discharge: 2011-03-11 | Disposition: A | Discharge status: Home / Self Care | Payer: Self-pay | Attending: Emergency Medicine | Admitting: Emergency Medicine

## 2011-03-11 ENCOUNTER — Emergency Department (HOSPITAL_COMMUNITY): Payer: Self-pay

## 2011-03-11 DIAGNOSIS — M722 Plantar fascial fibromatosis: Secondary | ICD-10-CM | POA: Insufficient documentation

## 2011-03-11 DIAGNOSIS — M79609 Pain in unspecified limb: Secondary | ICD-10-CM | POA: Insufficient documentation

## 2011-03-11 DIAGNOSIS — I10 Essential (primary) hypertension: Secondary | ICD-10-CM | POA: Insufficient documentation

## 2011-03-11 DIAGNOSIS — E119 Type 2 diabetes mellitus without complications: Secondary | ICD-10-CM | POA: Insufficient documentation

## 2011-07-18 LAB — I-STAT 8, (EC8 V) (CONVERTED LAB)
Acid-Base Excess: 2
Hemoglobin: 16
Potassium: 3.4 — ABNORMAL LOW
Sodium: 138
TCO2: 30
pH, Ven: 7.363 — ABNORMAL HIGH

## 2011-07-18 LAB — POCT CARDIAC MARKERS
CKMB, poc: <1 — ABNORMAL LOW
Troponin i, poc: <0.05

## 2019-05-30 DIAGNOSIS — L02212 Cutaneous abscess of back [any part, except buttock]: Secondary | ICD-10-CM | POA: Insufficient documentation

## 2019-07-13 DIAGNOSIS — L0211 Cutaneous abscess of neck: Secondary | ICD-10-CM | POA: Insufficient documentation

## 2019-10-26 DIAGNOSIS — R079 Chest pain, unspecified: Secondary | ICD-10-CM | POA: Insufficient documentation

## 2019-10-26 DIAGNOSIS — L03116 Cellulitis of left lower limb: Secondary | ICD-10-CM | POA: Insufficient documentation

## 2019-10-26 DIAGNOSIS — J45909 Unspecified asthma, uncomplicated: Secondary | ICD-10-CM | POA: Insufficient documentation

## 2019-10-26 DIAGNOSIS — M79672 Pain in left foot: Secondary | ICD-10-CM | POA: Insufficient documentation

## 2020-10-01 DIAGNOSIS — L03312 Cellulitis of back [any part except buttock]: Secondary | ICD-10-CM | POA: Insufficient documentation

## 2021-06-26 ENCOUNTER — Emergency Department (HOSPITAL_COMMUNITY): Payer: Medicaid - Out of State

## 2021-06-26 ENCOUNTER — Other Ambulatory Visit: Payer: Self-pay

## 2021-06-26 ENCOUNTER — Encounter (HOSPITAL_COMMUNITY): Payer: Self-pay

## 2021-06-26 ENCOUNTER — Emergency Department (HOSPITAL_COMMUNITY)
Admission: EM | Admit: 2021-06-26 | Discharge: 2021-06-26 | Disposition: A | Discharge status: Home / Self Care | Payer: Medicaid - Out of State | Attending: Emergency Medicine | Admitting: Emergency Medicine

## 2021-06-26 DIAGNOSIS — I110 Hypertensive heart disease with heart failure: Secondary | ICD-10-CM | POA: Insufficient documentation

## 2021-06-26 DIAGNOSIS — Y9301 Activity, walking, marching and hiking: Secondary | ICD-10-CM | POA: Insufficient documentation

## 2021-06-26 DIAGNOSIS — E119 Type 2 diabetes mellitus without complications: Secondary | ICD-10-CM | POA: Insufficient documentation

## 2021-06-26 DIAGNOSIS — J45909 Unspecified asthma, uncomplicated: Secondary | ICD-10-CM | POA: Diagnosis not present

## 2021-06-26 DIAGNOSIS — M79675 Pain in left toe(s): Secondary | ICD-10-CM | POA: Diagnosis not present

## 2021-06-26 DIAGNOSIS — W228XXA Striking against or struck by other objects, initial encounter: Secondary | ICD-10-CM | POA: Insufficient documentation

## 2021-06-26 DIAGNOSIS — W231XXA Caught, crushed, jammed, or pinched between stationary objects, initial encounter: Secondary | ICD-10-CM | POA: Diagnosis not present

## 2021-06-26 DIAGNOSIS — I509 Heart failure, unspecified: Secondary | ICD-10-CM | POA: Diagnosis not present

## 2021-06-26 DIAGNOSIS — M25511 Pain in right shoulder: Secondary | ICD-10-CM | POA: Diagnosis present

## 2021-06-26 DIAGNOSIS — M25512 Pain in left shoulder: Secondary | ICD-10-CM | POA: Diagnosis not present

## 2021-06-26 DIAGNOSIS — S99922A Unspecified injury of left foot, initial encounter: Secondary | ICD-10-CM

## 2021-06-26 DIAGNOSIS — R739 Hyperglycemia, unspecified: Secondary | ICD-10-CM

## 2021-06-26 HISTORY — DX: Essential (primary) hypertension: I10

## 2021-06-26 HISTORY — DX: Type 2 diabetes mellitus without complications: E11.9

## 2021-06-26 HISTORY — DX: Anxiety disorder, unspecified: F41.9

## 2021-06-26 HISTORY — DX: Heart failure, unspecified: I50.9

## 2021-06-26 HISTORY — DX: Depression, unspecified: F32.A

## 2021-06-26 LAB — CBC
HCT: 42.2 % (ref 39.0–52.0)
Hemoglobin: 14.6 g/dL (ref 13.0–17.0)
MCH: 30.7 pg (ref 26.0–34.0)
MCHC: 34.6 g/dL (ref 30.0–36.0)
MCV: 88.7 fL (ref 80.0–100.0)
Platelets: 242 10*3/uL (ref 150–400)
RBC: 4.76 MIL/uL (ref 4.22–5.81)
RDW: 11.5 % (ref 11.5–15.5)
WBC: 7.5 10*3/uL (ref 4.0–10.5)
nRBC: 0 % (ref 0.0–0.2)

## 2021-06-26 LAB — URINALYSIS, ROUTINE W REFLEX MICROSCOPIC
Bacteria, UA: NONE SEEN
Bilirubin Urine: NEGATIVE
Glucose, UA: >=500 mg/dL — AB
Ketones, ur: NEGATIVE mg/dL
Leukocytes,Ua: NEGATIVE
Nitrite: NEGATIVE
Protein, ur: 30 mg/dL — AB
Specific Gravity, Urine: 1.01 (ref 1.005–1.030)
pH: 6 (ref 5.0–8.0)

## 2021-06-26 LAB — BASIC METABOLIC PANEL
Anion gap: 10 (ref 5–15)
BUN: 27 mg/dL — ABNORMAL HIGH (ref 6–20)
CO2: 25 mmol/L (ref 22–32)
Calcium: 10.1 mg/dL (ref 8.9–10.3)
Chloride: 100 mmol/L (ref 98–111)
Creatinine, Ser: 1.06 mg/dL (ref 0.61–1.24)
GFR, Estimated: >60 mL/min (ref >60)
Glucose, Bld: 505 mg/dL (ref 70–99)
Potassium: 4.3 mmol/L (ref 3.5–5.1)
Sodium: 135 mmol/L (ref 135–145)

## 2021-06-26 LAB — CBG MONITORING, ED
Glucose-Capillary: 458 mg/dL — ABNORMAL HIGH (ref 70–99)
Glucose-Capillary: 462 mg/dL — ABNORMAL HIGH (ref 70–99)
Glucose-Capillary: 451 mg/dL — ABNORMAL HIGH (ref 70–99)

## 2021-06-26 MED ORDER — CEPHALEXIN 500 MG PO CAPS: 500.0000 mg | ORAL_CAPSULE | Freq: Four times a day (QID) | ORAL | 0 refills | Status: DC

## 2021-06-26 MED ORDER — INSULIN ASPART 100 UNIT/ML IJ SOLN
8.0000 [IU] | Freq: Once | INTRAMUSCULAR | Status: AC
  Administered 2021-06-26: 8 [IU] via SUBCUTANEOUS
  Filled 2021-06-26: qty 0.08

## 2021-06-26 MED ORDER — METHOCARBAMOL 500 MG PO TABS: 500.0000 mg | ORAL_TABLET | Freq: Two times a day (BID) | ORAL | 0 refills | Status: AC

## 2021-06-26 MED ORDER — METHOCARBAMOL 500 MG PO TABS: 500.0000 mg | ORAL_TABLET | Freq: Two times a day (BID) | ORAL | 0 refills | Status: DC | PRN

## 2021-06-26 MED ORDER — CEPHALEXIN 500 MG PO CAPS: 500.0000 mg | ORAL_CAPSULE | Freq: Four times a day (QID) | ORAL | 0 refills | Status: AC

## 2021-06-26 MED ORDER — METHOCARBAMOL 500 MG PO TABS
500.0000 mg | ORAL_TABLET | Freq: Once | ORAL | Status: AC
  Administered 2021-06-26: 500 mg via ORAL
  Filled 2021-06-26: qty 1

## 2021-06-26 NOTE — ED Provider Notes (Signed)
Patient is a 50 year old male whose care was transferred me at shift change from [facility].  Her HPI is below:  Leroy Simmons is a 50 y.o. male presenting for evaluation of bilateral shoulder pain and L middle toe pain.    Pt states a few weeks ago he was walking by the road when something sticking off the end of a truck hit him in the back and knocked him forward. Since then he has had pain of both shoulders. Pain is worse when he moves his arms, specifically over 90 degrees.  No numbness or tingling in the hands.  No pain in his neck or back.  He has been taking Tylenol and ibuprofen without improvement of symptoms.  He has not taken anything else.  He has not had it evaluated since the injury.  Additionally, patient states today he had abnormal sensation of his foot.  When he looked down, his toenail was mostly off.  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  He has never seen a podiatrist before.  No pain or injury elsewhere.  Physical Exam  BP (!) 168/101 (BP Location: Left Arm)   Pulse (!) 102   Temp 98 F (36.7 C) (Oral)   Resp 16   Ht 5' 7.75" (1.721 m)   Wt 99.8 kg   SpO2 95%   BMI 33.70 kg/m   Physical Exam Vitals and nursing note reviewed.  Constitutional:      General: He is not in acute distress.    Appearance: Normal appearance.     Comments: Resting in the bed in NAD  HENT:     Head: Normocephalic and atraumatic.  Eyes:     Conjunctiva/sclera: Conjunctivae normal.     Pupils: Pupils are equal, round, and reactive to light.  Neck:     Comments: No ttp of midline c-spine Cardiovascular:     Rate and Rhythm: Normal rate and regular rhythm.     Pulses: Normal pulses.  Pulmonary:     Effort: Pulmonary effort is normal. No respiratory distress.     Breath sounds: Normal breath sounds. No wheezing.     Comments: Speaking in full sentences.  Clear lung sounds in all fields. Abdominal:     General: There is no distension.      Palpations: Abdomen is soft.     Tenderness: There is no abdominal tenderness.  Musculoskeletal:        General: Tenderness present. Normal range of motion.     Cervical back: Normal range of motion and neck supple.     Comments: Ttp of bilateral back and shoulder musculature. Positive empty can test. Grip strength equal bilaterally. Radial pulses 2+. Strength of upper ext equal bilaterally. Pain with passive and active ROM.  No ttp of midline spine.  L middle toenail partially avulsed.   Skin:    General: Skin is warm and dry.     Capillary Refill: Capillary refill takes less than 2 seconds.  Neurological:     Mental Status: He is alert and oriented to person, place, and time.  Psychiatric:        Mood and Affect: Mood and affect normal.        Speech: Speech normal.        Behavior: Behavior normal.  ED Course/Procedures     Procedures  MDM  Patient is a 50 year old male whose care was transferred to me at shift change from prior PA-C.  Please see  her note below for further information.  Patient has not taken his regular medications for diabetes mellitus today.  He was found to be hyperglycemic.  At shift change patient had received insulin and was awaiting results of his remaining lab work as well as repeat CBG.  BMP has since resulted with a glucose of 505, BUN of 27.  Repeat CBG slightly improved to 462.  No elevation in anion gap at 10.  UA showing glucosuria greater then 500, small hemoglobin, proteinuria of 30.  No ketonuria.  Given reassuring anion gap as well as no ketonuria, doubt DKA at this time.  Feel the patient is stable for discharge at this time and he is agreeable.  Previous provider has given patient prescriptions for Keflex as well as Robaxin.  Urged patient to make sure he is taking his diabetes mellitus medications as prescribed.  His questions were answered and he was amicable at the time of discharge.       Leroy Sou, PA-C 06/26/21 Leroy Simmons    Leroy Manchester, MD 06/28/21 [phone]

## 2021-06-26 NOTE — ED Provider Notes (Signed)
[facility]-EMERGENCY DEPT Provider Note   CSN: [id number] Arrival date & time: 06/26/21  1504     History Chief Complaint  Patient presents with   Toe Pain   Arm Pain    Leroy Simmons is a 50 y.o. male presenting for evaluation of bilateral shoulder pain and L middle toe pain.   Pt states a few weeks ago he was walking by the road when something sticking off the end of a truck hit him in the back and knocked him forward. Since then he has had pain of both shoulders. Pain is worse when he moves his arms, specifically over 90 degrees.  No numbness or tingling in the hands.  No pain in his neck or back.  He has been taking Tylenol and ibuprofen without improvement of symptoms.  He has not taken anything else.  He has not had it evaluated since the injury. Additionally, patient states today he had abnormal sensation of his foot.  When he looked down, his toenail was mostly off.  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  He has never seen a podiatrist before.  No pain or injury elsewhere  HPI     Past Medical History:  Diagnosis Date   Anxiety    CHF (congestive heart failure) (HCC)    Depression    Diabetes mellitus without complication (HCC)    Hypertension     Patient Active Problem List   Diagnosis Date Noted   VENTRICULAR HYPERTROPHY, LEFT 09/21/2009   DIABETES MELLITUS, TYPE II 09/07/2009   HYPOKALEMIA 09/07/2009   MORBID OBESITY 09/07/2009   ANXIETY 09/07/2009   DEPRESSION 09/07/2009   HYPERTENSION 09/07/2009   ASTHMA 09/07/2009   DIARRHEA, CHRONIC 09/07/2009    History reviewed. No pertinent surgical history.     History reviewed. No pertinent family history.  Social History   Tobacco Use   Smoking status: Never   Smokeless tobacco: Never  Vaping Use   Vaping Use: Never used  Substance Use Topics   Alcohol use: Yes   Drug use: Never    Home Medications Prior to Admission medications   Not on File     Allergies    Patient has no known allergies.  Review of Systems   Review of Systems  Musculoskeletal:  Positive for myalgias.  Skin:        L middle toenail partial avulsion.    Physical Exam Updated Vital Signs BP (!) 168/101 (BP Location: Left Arm)   Pulse (!) 102   Temp 98 F (36.7 C) (Oral)   Resp 16   Ht 5' 7.75" (1.721 m)   Wt 99.8 kg   SpO2 95%   BMI 33.70 kg/m   Physical Exam Vitals and nursing note reviewed.  Constitutional:      General: He is not in acute distress.    Appearance: Normal appearance.     Comments: Resting in the bed in NAD  HENT:     Head: Normocephalic and atraumatic.  Eyes:     Conjunctiva/sclera: Conjunctivae normal.     Pupils: Pupils are equal, round, and reactive to light.  Neck:     Comments: No ttp of midline c-spine Cardiovascular:     Rate and Rhythm: Normal rate and regular rhythm.     Pulses: Normal pulses.  Pulmonary:     Effort: Pulmonary effort is normal. No respiratory distress.     Breath sounds: Normal breath sounds. No wheezing.  Comments: Speaking in full sentences.  Clear lung sounds in all fields. Abdominal:     General: There is no distension.     Palpations: Abdomen is soft.     Tenderness: There is no abdominal tenderness.  Musculoskeletal:        General: Tenderness present. Normal range of motion.     Cervical back: Normal range of motion and neck supple.     Comments: Ttp of bilateral back and shoulder musculature. Positive empty can test. Grip strength equal bilaterally. Radial pulses 2+. Strength of upper ext equal bilaterally. Pain with passive and active ROM.  No ttp of midline spine.  L middle toenail partially avulsed.   Skin:    General: Skin is warm and dry.     Capillary Refill: Capillary refill takes less than 2 seconds.  Neurological:     Mental Status: He is alert and oriented to person, place, and time.  Psychiatric:        Mood and Affect: Mood and affect normal.        Speech:  Speech normal.        Behavior: Behavior normal.    ED Results / Procedures / Treatments   Labs (all labs ordered are listed, but only abnormal results are displayed) Labs Reviewed - No data to display  EKG None  Radiology No results found.  Procedures Procedures   Medications Ordered in ED Medications - No data to display  ED Course  I have reviewed the triage vital signs and the nursing notes.  Pertinent labs & imaging results that were available during my care of the patient were reviewed by me and considered in my medical decision making (see chart for details).    MDM Rules/Calculators/A&P                           Pt presenting for evaluation of bilateral arm pain and toe injury. On exam, patient appears nontoxic.  No neurovascular deficit of the upper extremities.  No pain over midline spine.  Obtain shoulder x-rays, though I have low suspicion for bony injury.  Likely MSK and will need to follow-up outpatient with orthopedics.  Regarding toenail avulsion, toenail was replaced and wrapped.  Will have patient follow-up with podiatry.  Will place on antibiotics, especially with his history of diabetes. Patient CBG elevated at 450.  He states he has not taken any of his medication today including the glipizide or metformin.  Will check basic labs and urine to ensure no acidosis or concerning findings, however patient will likely just need to take his home medications.  Pt signed out to L [place], PA-C for f/u on labs, urine, and repeat CBG.   Final Clinical Impression(s) / ED Diagnoses Final diagnoses:  None    Rx / DC Orders ED Discharge Orders     None        Alveria Apley, PA-C 06/26/21 1756    Koleen Distance, MD 06/26/21 2134

## 2021-06-26 NOTE — ED Triage Notes (Signed)
Patient states his left 3rd toenail got caught in his shoe and broke off. Slight bleeding noted.  Patient states he was walking down [address] and something flew off of a truck and hit him, knocking him down. Patient c/o bilateral shoulder and arm pain. Patient states he can not reach his back to scratch and has discomfort when sleeping.

## 2021-06-26 NOTE — Discharge Instructions (Addendum)
Take antibiotics as prescribed.  Take the entire course, even if symptoms improve. Follow-up with the podiatrist, Dr. Ardelle Anton for further evaluation of your toe.  Continue taking Tylenol and ibuprofen up with your pain.  Take the muscle relaxer as needed for increased stiffness or soreness.  Have caution, this may make you tired or groggy.  Do not drive or operate heavy machinery while take this medicine. Follow-up with the orthopedic doctor, Dr. Yevette Edwards, listed below for further evaluation of your shoulder pain.  Make sure you are taking all your home medications as prescribed.  Your blood sugar today was elevated, but there was no evidence of DKA  Return to the emergency room with any new, worsening, concerning symptoms.

## 2021-06-29 ENCOUNTER — Ambulatory Visit (INDEPENDENT_AMBULATORY_CARE_PROVIDER_SITE_OTHER): Payer: Self-pay | Admitting: Podiatry

## 2021-06-29 ENCOUNTER — Other Ambulatory Visit: Payer: Self-pay

## 2021-06-29 DIAGNOSIS — S91209A Unspecified open wound of unspecified toe(s) with damage to nail, initial encounter: Secondary | ICD-10-CM

## 2021-06-29 NOTE — Patient Instructions (Signed)

## 2021-06-30 NOTE — Progress Notes (Signed)
  Subjective:  Patient ID: Leroy Simmons, male    DOB: [birth year],  MRN: [id number]  Chief Complaint  Patient presents with   Nail Problem    (np) left foot middle toe-nail is falling off*self pay    50 y.o. male presents with the above complaint. History confirmed with patient.  Injured his nail and nearly ripped it off.  He went to the ER and they referred him here with him on antibiotics  Objective:  Physical Exam: warm, good capillary refill, no trophic changes or ulcerative lesions, and normal DP and PT pulses. Left Foot: Third toe traumatic partial nail avulsion Assessment:   1. Traumatic avulsion of nail plate of toe, initial encounter      Plan:  Patient was evaluated and treated and all questions answered.  Avulsed the nail plate of the third toe.  Recommend he dress daily with Neosporin and a Band-Aid.  Epson salt soaks recommended.  Finish antibiotics.  Return as needed  Return if symptoms worsen or fail to improve.

## 2021-07-08 ENCOUNTER — Other Ambulatory Visit: Payer: Self-pay

## 2021-07-08 ENCOUNTER — Encounter: Payer: Self-pay | Admitting: Orthopaedic Surgery

## 2021-07-08 ENCOUNTER — Ambulatory Visit (INDEPENDENT_AMBULATORY_CARE_PROVIDER_SITE_OTHER): Payer: No Typology Code available for payment source | Admitting: Orthopaedic Surgery

## 2021-07-08 DIAGNOSIS — M25511 Pain in right shoulder: Secondary | ICD-10-CM

## 2021-07-08 DIAGNOSIS — G8929 Other chronic pain: Secondary | ICD-10-CM

## 2021-07-08 DIAGNOSIS — M25512 Pain in left shoulder: Secondary | ICD-10-CM | POA: Diagnosis not present

## 2021-07-08 MED ORDER — LIDOCAINE HCL 2 % IJ SOLN
2.0000 mL | INTRAMUSCULAR | Status: AC | PRN
  Administered 2021-07-08: 2 mL

## 2021-07-08 MED ORDER — METHYLPREDNISOLONE ACETATE 40 MG/ML IJ SUSP
40.0000 mg | INTRAMUSCULAR | Status: AC | PRN
  Administered 2021-07-08: 40 mg via INTRA_ARTICULAR

## 2021-07-08 MED ORDER — BUPIVACAINE HCL 0.25 % IJ SOLN
2.0000 mL | INTRAMUSCULAR | Status: AC | PRN
  Administered 2021-07-08: 2 mL via INTRA_ARTICULAR

## 2021-07-08 NOTE — Progress Notes (Signed)
Office Visit Note   Patient: Leroy Simmons           Date of Birth: 29-Dec-1970           MRN: [id number] Visit Date: 07/08/2021              Requested by: Lavinia Sharps, NP [address] [place],  [place] [address] PCP: Lavinia Sharps, NP   Assessment & Plan: Visit Diagnoses:  1. Chronic pain of both shoulders     Plan: Impression is bilateral shoulder rotator cuff tendinitis.  Today, we discussed subacromial cortisone injection for which he would like to proceed but just with the right shoulder today because he is diabetic.  If he feels relief from this shot, he will consider coming back in about 2 weeks for a left subacromial injection.  He will follow-up with [place] as needed.  Follow-Up Instructions: Return if symptoms worsen or fail to improve.   Orders:  Orders Placed This Encounter  Procedures   Large Joint Inj: R subacromial bursa   No orders of the defined types were placed in this encounter.     Procedures: Large Joint Inj: R subacromial bursa on 07/08/2021 11:04 AM Indications: pain Details: 22 G needle Medications: 2 mL lidocaine 2 %; 2 mL bupivacaine 0.25 %; 40 mg methylPREDNISolone acetate 40 MG/ML Outcome: tolerated well, no immediate complications Patient was prepped and draped in the usual sterile fashion.      Clinical Data: No additional findings.   Subjective: Chief Complaint  Patient presents with   Right Shoulder - Pain   Left Shoulder - Pain    HPI patient is a pleasant 50 year old gentleman who comes in today with bilateral shoulder pain right greater than left for the past several months.  He denies any specific injury or change in activity.  The pain he has is primarily to the deltoid of both arms but does radiate down into the forearm.  He notes occasional pain to the left side of the neck as well as under his shoulder blades.  The pain he has in the shoulders is described as constant and worse when he is using his shoulders.  He has  tried muscle relaxers without relief.  He does note occasional burning sensation down the arm.  No previous cortisone injection to the shoulder.  Review of Systems as detailed in HPI.  All others reviewed are negative.   Objective: Vital Signs: There were no vitals taken for this visit.  Physical Exam well-developed well-nourished gentleman in no acute distress.  Alert and oriented x3.  Ortho Exam bilateral shoulder exam shows forward flexion to about 150 degrees.  Near full external rotation abduction.  He can internally rotate to L5.  Markedly positive empty can.  Near full strength throughout.  He is neurovascular intact distally.  Specialty Comments:  No specialty comments available.  Imaging: X-rays reviewed by me in canopy show no acute or structural abnormalities.   PMFS History: Patient Active Problem List   Diagnosis Date Noted   VENTRICULAR HYPERTROPHY, LEFT 09/21/2009   DIABETES MELLITUS, TYPE II 09/07/2009   HYPOKALEMIA 09/07/2009   MORBID OBESITY 09/07/2009   ANXIETY 09/07/2009   DEPRESSION 09/07/2009   HYPERTENSION 09/07/2009   ASTHMA 09/07/2009   DIARRHEA, CHRONIC 09/07/2009   Past Medical History:  Diagnosis Date   Anxiety    CHF (congestive heart failure) (HCC)    Depression    Diabetes mellitus without complication (HCC)    Hypertension  No family history on file.  No past surgical history on file. Social History   Occupational History   Not on file  Tobacco Use   Smoking status: Never   Smokeless tobacco: Never  Vaping Use   Vaping Use: Never used  Substance and Sexual Activity   Alcohol use: Yes   Drug use: Never   Sexual activity: Not on file

## 2021-07-21 ENCOUNTER — Other Ambulatory Visit: Payer: Self-pay

## 2021-07-21 ENCOUNTER — Ambulatory Visit (INDEPENDENT_AMBULATORY_CARE_PROVIDER_SITE_OTHER): Payer: No Typology Code available for payment source | Admitting: Orthopaedic Surgery

## 2021-07-21 ENCOUNTER — Encounter: Payer: Self-pay | Admitting: Orthopaedic Surgery

## 2021-07-21 DIAGNOSIS — M25511 Pain in right shoulder: Secondary | ICD-10-CM

## 2021-07-21 DIAGNOSIS — M25512 Pain in left shoulder: Secondary | ICD-10-CM | POA: Diagnosis not present

## 2021-07-21 DIAGNOSIS — G8929 Other chronic pain: Secondary | ICD-10-CM

## 2021-07-21 MED ORDER — METHOCARBAMOL 500 MG PO TABS: 500.0000 mg | ORAL_TABLET | Freq: Two times a day (BID) | ORAL | 2 refills | Status: DC | PRN

## 2021-07-21 NOTE — Progress Notes (Signed)
Office Visit Note   Patient: Leroy Simmons           Date of Birth: 06/30/1971           MRN: [id number] Visit Date: 07/21/2021              Requested by: Lavinia Sharps, NP [address] [place],  [place] [address] PCP: Lavinia Sharps, NP   Assessment & Plan: Visit Diagnoses:  1. Chronic pain of both shoulders     Plan: Impression is bilateral shoulder rotator cuff tendinitis.  Today, we injected the left shoulder subacromial space with cortisone.  He tolerated this well.  We have discussed starting a course of formal physical therapy for both shoulders which she is agreeable to.  If his symptoms do not improve or worsen over the next few weeks, he will call us and we will get an MRI of both shoulders to assess his rotator cuff.  Otherwise, follow-up with [place] as needed.  Follow-Up Instructions: Return if symptoms worsen or fail to improve.   Orders:  Orders Placed This Encounter  Procedures   Large Joint Inj: L subacromial bursa   Ambulatory referral to Physical Therapy   Meds ordered this encounter  Medications   methocarbamol (ROBAXIN) 500 MG tablet    Sig: Take 1 tablet (500 mg total) by mouth 2 (two) times daily as needed.    Dispense:  20 tablet    Refill:  2      Procedures: Large Joint Inj: L subacromial bursa on 07/21/2021 10:41 AM Indications: pain Details: 22 G needle Medications: 2 mL lidocaine 2 %; 2 mL bupivacaine 0.25 %; 40 mg methylPREDNISolone acetate 40 MG/ML Outcome: tolerated well, no immediate complications Patient was prepped and draped in the usual sterile fashion.      Clinical Data: No additional findings.   Subjective: Chief Complaint  Patient presents with   Right Shoulder - Pain   Left Shoulder - Pain    HPI patient is a pleasant 50 year old gentleman who comes in today for left shoulder cortisone injection.  He was seen by me about 2 weeks ago with chronic bilateral shoulder pain for the past few months.  No specific  injury.  The pain has been to the deltoid of both sides and worse with certain motions of the shoulder.  We only injected the right shoulder 2 weeks ago due to underlying diabetes, but he notes significant relief which unfortunately only lasted a day.  His pain has returned and has gradually worsened.  He has not previously been to physical therapy for either shoulder.  Review of Systems as detailed in HPI.  All others reviewed and are negative.   Objective: Vital Signs: There were no vitals taken for this visit.  Physical Exam well-developed well-nourished gentleman in no acute distress.  Alert and oriented x3.  Ortho Exam unchanged bilateral shoulder exam  Specialty Comments:  No specialty comments available.  Imaging: No new imaging   PMFS History: Patient Active Problem List   Diagnosis Date Noted   VENTRICULAR HYPERTROPHY, LEFT 09/21/2009   DIABETES MELLITUS, TYPE II 09/07/2009   HYPOKALEMIA 09/07/2009   MORBID OBESITY 09/07/2009   ANXIETY 09/07/2009   DEPRESSION 09/07/2009   HYPERTENSION 09/07/2009   ASTHMA 09/07/2009   DIARRHEA, CHRONIC 09/07/2009   Past Medical History:  Diagnosis Date   Anxiety    CHF (congestive heart failure) (HCC)    Depression    Diabetes mellitus without complication (HCC)  Hypertension     History reviewed. No pertinent family history.  History reviewed. No pertinent surgical history. Social History   Occupational History   Not on file  Tobacco Use   Smoking status: Never   Smokeless tobacco: Never  Vaping Use   Vaping Use: Never used  Substance and Sexual Activity   Alcohol use: Yes   Drug use: Never   Sexual activity: Not on file

## 2021-07-23 MED ORDER — LIDOCAINE HCL 2 % IJ SOLN
2.0000 mL | INTRAMUSCULAR | Status: AC | PRN
  Administered 2021-07-21: 2 mL

## 2021-07-23 MED ORDER — BUPIVACAINE HCL 0.25 % IJ SOLN
2.0000 mL | INTRAMUSCULAR | Status: AC | PRN
  Administered 2021-07-21: 2 mL via INTRA_ARTICULAR

## 2021-07-23 MED ORDER — METHYLPREDNISOLONE ACETATE 40 MG/ML IJ SUSP
40.0000 mg | INTRAMUSCULAR | Status: AC | PRN
  Administered 2021-07-21: 40 mg via INTRA_ARTICULAR

## 2021-08-31 ENCOUNTER — Encounter: Payer: Self-pay | Admitting: Family Medicine

## 2021-08-31 ENCOUNTER — Ambulatory Visit (INDEPENDENT_AMBULATORY_CARE_PROVIDER_SITE_OTHER): Payer: Self-pay | Admitting: Family Medicine

## 2021-08-31 VITALS — BP 152/100 | Ht 68.0 in | Wt 220.0 lb

## 2021-08-31 DIAGNOSIS — M25512 Pain in left shoulder: Secondary | ICD-10-CM

## 2021-08-31 DIAGNOSIS — M25511 Pain in right shoulder: Secondary | ICD-10-CM

## 2021-08-31 NOTE — Progress Notes (Signed)
SUBJECTIVE:   CHIEF COMPLAINT / HPI:   Shoulder pain, bilateral: 50 year old male presenting with shoulder pain for the past 6 or 7 months.  He denies any specific trauma other than he was hit in the left shoulder by a item hanging off of a vehicle while walking down the street a few months ago.  He subsequently went to the emergency department and had images that did not show any fracture.  He states that his right shoulder has been pain for for at least 6 or 7 months.  He states he has tried some muscle relaxers and also went to an orthopedist who gave him an injection in each shoulder.  He states neither of these items provided him much relief.  He denies any subsequent traumas or falls.  He denies any weakness in his arms.  He states that he does sometimes get tingling sensation in the fingertips of his right arm.  He has not yet tried physical therapy.   OBJECTIVE:   BP (!) 152/100   Ht 5\' 8"  (1.727 m)   Wt 220 lb (99.8 kg)   BMI 33.45 kg/m    General: NAD, pleasant, able to participate in exam MSK: Shoulder, Left: No evidence of bony deformity, asymmetry, or muscle atrophy; No tenderness over long head of biceps (bicipital groove). No TTP at [facility] joint.  Limited active and passive range of motion with external rotation, normal active and passive range of motion with shoulder abduction and flexion, Thumb to T12 without significant tenderness. Strength 5/5 throughout. Sensation intact. Peripheral pulses intact. Shoulder, Right: No evidence of bony deformity, asymmetry, or muscle atrophy; No tenderness over long head of biceps (bicipital groove). No TTP at [facility] joint.  Moderately limited active and passive range of motion with external rotation, normal range of motion with internal rotation, shoulder abduction, shoulder flexion, Thumb to T12 without significant tenderness. Strength 5/5 with shoulder flexion, abduction, elbow flexion and extension. Sensation intact. Peripheral pulses  intact.  Shoulder, bilateral special tests: Negative Yergason, negative empty can, negative cross arm AC joint testing.  Patient with negative Spurling's test.   ASSESSMENT/PLAN:   Bilateral shoulder pain, frozen shoulder: 50 year old male with bilateral shoulder pain which has been going on for several months as well as concern for the start of frozen shoulder particularly on the right side.  He did have an injury to his left shoulder several months ago but was experiencing pain before this and has had x-ray that showed no signs of fracture.  His right shoulder has significant limitation in external rotation.  Patient has tried shoulder injections bilaterally which do not provide a lot of relief.  He had some complaints of pain moving into his fingertips on the right side but has negative Spurling's test.  Special testing did not present any obvious sign of a full rotator cuff tear and his limitation in range of motion suggest the start of "frozen shoulder" particularly on the right side.  Strongly recommended that he start doing a daily exercise program which we provided exercises for.  Recommended he use meloxicam for 2 weeks and follow-up in 3 to 4 weeks for further evaluation on his mobility and range of motion.  Discussed with him that it can take 3 to 6 months for his range of motion to return to normal.  He will follow-up sooner if he has any worsening issues.  Can consider additional imaging versus formal physical therapy in the future if pain does not continue to improve.  Lurline Del, [place]

## 2021-08-31 NOTE — Progress Notes (Signed)
[facility] Attending Note: I have seen and examined this patient. I have discussed this patient with the resident and reviewed the assessment and plan as documented above. I agree with the resident's findings and plan. Atypical presentation of bilateral shoulder pain.  He does have some limited range of motion with external rotation and abduction.  I wonder if he started to get some adhesive capsulitis.  We will start home exercise program.  2 weeks of meloxicam as a suspect this will increase his pain some.  Long discussion with him that he will absolutely have to work hard at these exercises if he wants to get better and the harder he works, the last time and will take, but either way will be somewhere around the range of 3 to 6 months to get his full range of motion back.

## 2021-08-31 NOTE — Patient Instructions (Signed)
For your shoulder pain and concern for "frozen shoulder" we are going to start some daily exercises to help improve the movement of your shoulder.  Recommend that you are diligent with performing these.  We also will give you 2 weeks of meloxicam which is an anti-inflammatory to help improve the discomfort of your shoulder.  We would like to see you back in 3 to 4 weeks or sooner if your pain worsens or returns.

## 2021-09-02 ENCOUNTER — Other Ambulatory Visit: Payer: Self-pay

## 2021-09-02 MED ORDER — MELOXICAM 15 MG PO TABS: 15.0000 mg | ORAL_TABLET | Freq: Every day | ORAL | 0 refills | Status: DC

## 2021-09-02 NOTE — Progress Notes (Signed)
Per pt Dr. Jennette Kettle was going to send in Meloxicam to his pharmacy but nothing was sent in.   Rx sent to pt's pharmacy.

## 2021-09-17 ENCOUNTER — Ambulatory Visit: Payer: No Typology Code available for payment source | Admitting: Family Medicine

## 2021-10-22 ENCOUNTER — Ambulatory Visit: Payer: Self-pay

## 2021-10-22 ENCOUNTER — Ambulatory Visit (INDEPENDENT_AMBULATORY_CARE_PROVIDER_SITE_OTHER): Payer: 59 | Admitting: Family Medicine

## 2021-10-22 VITALS — BP 148/102 | Ht 67.75 in | Wt 225.0 lb

## 2021-10-22 DIAGNOSIS — G8929 Other chronic pain: Secondary | ICD-10-CM

## 2021-10-22 DIAGNOSIS — M25511 Pain in right shoulder: Secondary | ICD-10-CM | POA: Diagnosis not present

## 2021-10-22 DIAGNOSIS — M25512 Pain in left shoulder: Secondary | ICD-10-CM

## 2021-10-22 MED ORDER — MELOXICAM 15 MG PO TABS: 15.0000 mg | ORAL_TABLET | Freq: Every day | ORAL | 0 refills | Status: DC | PRN

## 2021-10-22 NOTE — Assessment & Plan Note (Addendum)
Concern for rotator cuff tendinitis that is possibly progressing to adhesive capsulitis.  Suspect that patient would benefit the most from a glenohumeral joint injection, discussed risk and benefits of a high-volume lidocaine only injection given his uncontrolled diabetes would not use corticosteroid.  He opts to proceed today.  Recommend continuation of home exercise program, and follow-up in 2 to 3 weeks to consider repeat high-volume lidocaine injection.    Procedure performed: Glenohumeral corticosteroid injection, ultrasound-guided  Ultrasound-guided right glenohumeral injection: After sterile prep with Betadine, injected 10 cc 1% lidocaine without epinephrine  using a 22-gauge 1in, passing the needle through approach into the glenohumeral joint.  Injectate was seen filling the joint capsule.  He had good immediate relief during the anesthetic phase.  Follow-up as directed.  Consent obtained and verified. Time-out conducted. Noted no overlying erythema, induration, or other signs of local infection. The  right glenohumeral joint was visualized by ultrasound posteriorly.  The overlying skin was prepped in a sterile fashion. Topical analgesic spray: Ethyl chloride. Joint: right glenohumeral Needle: 22-gauge 1 inch Completed without difficulty. Meds: 10 cc 1% lidocaine without epinephrine  Immediately after his range of motion improved without significant pain, external rotation improved by 10 degrees.  We will plan to repeat injection in 2 to 3 weeks.  At that time, would recommend using a spinal needle.

## 2021-10-22 NOTE — Progress Notes (Signed)
[facility]: Attending Note: I have reviewed the chart, discussed wit the Sports Medicine Fellow. I agree with assessment and treatment plan as detailed in the Fellow's note. Continued shoulder pain and certainly at high risk for adhesive capsulitis.  Already has some decreased range of motion.  Given underlying poorly controlled diabetes I agree with serial high-volume injections of lidocaine under ultrasound.  He had some immediate relief and was able to improve range of motion after first injection today.  Home exercise program given.  Follow-up 2 weeks or so.

## 2021-10-22 NOTE — Progress Notes (Signed)
° °  [place] is a 51 y.o. male who presents to [facility] today for the following:  Bilateral shoulder pain follow-up Last seen for the same on 11/29 Has previously tried subacromial injections that did not improve pain Placed on meloxicam and given home exercises for adhesive capsulitis Has been doing home exercises and finished meloxicam States that meloxicam helps some Also found that Robaxin was helpful which he started using after he ran out of meloxicam States that right has been worsening since he was last seen, left shoulder pain is about the same He has difficulty sleeping due to getting comfortable from the pain in his shoulders States that his diabetes has not been well controlled recently and yesterday his CBGs were in the 400s, he is seeing his PCP on 2/1 He is otherwise feeling well and denies any signs or symptoms of DKA    PMH reviewed.  ROS as above. Medications reviewed.  Exam:  BP (!) 148/102    Ht 5' 7.75" (1.721 m)    Wt 225 lb (102.1 kg)    BMI 34.46 kg/m  Gen: Well NAD MSK:  Bilateral Shoulders: Inspection reveals no obvious deformity, atrophy, or asymmetry b/l. No bruising. No swelling Palpation is normal with no TTP over [facility] joint or bicipital groove b/l. bilateral forward flexion to 130 degrees with pain throughout, abduction to 160 degrees, external rotation is limited by 10 degrees on the right compared to the left, internal rotation bilaterally to back pocket NV intact distally b/l Normal scapular function observed b/l Special Tests:  - Impingement: Positive Hawkins - Supraspinatous: Equivocal empty can with pain, no weakness - Infraspinatous/Teres Minor: 5/5 strength with ER - Subscapularis: 5/5 strength with IR - Biceps tendon: Negative Speeds - Labrum: Negative Obriens, negative clunk, good stability   No results found.   Assessment and Plan: 1) Pain of both shoulder joints Concern for rotator cuff tendinitis that is possibly progressing to  adhesive capsulitis.  Suspect that patient would benefit the most from a glenohumeral joint injection, discussed risk and benefits of a high-volume lidocaine only injection given his uncontrolled diabetes would not use corticosteroid.  He opts to proceed today.  Recommend continuation of home exercise program, and follow-up in 2 to 3 weeks to consider repeat high-volume lidocaine injection.    Procedure performed: Glenohumeral corticosteroid injection, ultrasound-guided  Ultrasound-guided right glenohumeral injection: After sterile prep with Betadine, injected 10 cc 1% lidocaine without epinephrine  using a 22-gauge 1in, passing the needle through approach into the glenohumeral joint.  Injectate was seen filling the joint capsule.  He had good immediate relief during the anesthetic phase.  Follow-up as directed.  Consent obtained and verified. Time-out conducted. Noted no overlying erythema, induration, or other signs of local infection. The  right glenohumeral joint was visualized by ultrasound posteriorly.  The overlying skin was prepped in a sterile fashion. Topical analgesic spray: Ethyl chloride. Joint: right glenohumeral Needle: 22-gauge 1 inch Completed without difficulty. Meds: 10 cc 1% lidocaine without epinephrine  Immediately after his range of motion improved without significant pain, external rotation improved by 10 degrees.  We will plan to repeat injection in 2 to 3 weeks.  At that time, would recommend using a spinal needle.   Luis Abed, D.O.  PGY-4 [facility]  10/22/2021 11:00 AM

## 2021-10-22 NOTE — Patient Instructions (Signed)
Thank you for coming to see me today. It was a pleasure. Today we talked about:   Today you received an injection with corticosteroid. This injection is usually done in response to pain and inflammation. There is some "numbing" medicine also in the shot so the injected area may be numb and feel really good for the next couple of hours. The numbing medicine usually wears off in 2-3 hours though, and then your pain level will be right back where it was before the injection.   The actually benefit from the steroid injection is usually noticed in 2-7 days. You may actually experience a small (as in 10%) INCREASE in pain in the first 24 hours---that is common.   Things to watch out for that you should contact us or a health care provider urgently would include: 1. Unusual (as in more than 10%) increase in pain 2. New fever > 101.5 3. New swelling or redness of the injected area.  4. Streaking of red lines around the area injected.   Do the exercises that we gave you to the point of discomfort, but do not push past pain.  Please follow-up with [place] in 2-3 weeks for another injection.  If you have any questions or concerns, please do not hesitate to call the office at [phone].  Best,   Luis Abed, DO [facility]

## 2021-11-03 ENCOUNTER — Ambulatory Visit (HOSPITAL_BASED_OUTPATIENT_CLINIC_OR_DEPARTMENT_OTHER): Payer: Self-pay | Admitting: Family Medicine

## 2021-11-12 ENCOUNTER — Ambulatory Visit (INDEPENDENT_AMBULATORY_CARE_PROVIDER_SITE_OTHER): Payer: 59 | Admitting: Family Medicine

## 2021-11-12 VITALS — BP 158/89 | Ht 67.75 in | Wt 216.0 lb

## 2021-11-12 DIAGNOSIS — M25511 Pain in right shoulder: Secondary | ICD-10-CM | POA: Diagnosis not present

## 2021-11-12 DIAGNOSIS — G8929 Other chronic pain: Secondary | ICD-10-CM | POA: Diagnosis not present

## 2021-11-12 MED ORDER — METHYLPREDNISOLONE ACETATE 40 MG/ML IJ SUSP
40.0000 mg | Freq: Once | INTRAMUSCULAR | Status: AC
  Administered 2021-11-12: 40 mg via INTRA_ARTICULAR

## 2021-11-12 NOTE — Progress Notes (Signed)
PCP: Lavinia Sharps, NP  Subjective:   HPI: Patient is a 51 y.o. male here for follow up for right shoulder pain.  Joint injection did not help last time. He has chronic shoulder pain in both shoulders but states he left should is doing well today. He states it was short lived and only lasted until he drove home from the office. He has attempted to do shoulder exercises but this exacerbates his pain. Laying down in certain positions also aggravates his right shoulder. He will occasionally take meloxicam and methocarbamol with minimal relief. He desires pain relief.    Past Medical History:  Diagnosis Date   Anxiety    CHF (congestive heart failure) (HCC)    Depression    Diabetes mellitus without complication (HCC)    Hypertension     Current Outpatient Medications on File Prior to Visit  Medication Sig Dispense Refill   acetaminophen (TYLENOL) 325 MG tablet Take by mouth. (Patient not taking: Reported on 07/08/2021)     albuterol (VENTOLIN HFA) 108 (90 Base) MCG/ACT inhaler Inhale 2 puffs into the lungs every 4 (four) hours as needed for wheezing. (Patient not taking: Reported on 07/08/2021)     cephALEXin (KEFLEX) 500 MG capsule Take 500 mg by mouth 4 (four) times daily.     escitalopram (LEXAPRO) 10 MG tablet Take by mouth.     furosemide (LASIX) 40 MG tablet Take 40 mg by mouth daily.     hydrOXYzine (ATARAX/VISTARIL) 25 MG tablet Take by mouth. (Patient not taking: Reported on 07/08/2021)     ibuprofen (ADVIL) 800 MG tablet Take 800 mg by mouth every 8 (eight) hours as needed.     insulin lispro protamine-lispro (HUMALOG 75/25 MIX) (75-25) 100 UNIT/ML SUSP injection Inject 26 Units into the skin 2 (two) times daily with breakfast and lunch. (Patient not taking: Reported on 07/08/2021)     JANUVIA 100 MG tablet Take 100 mg by mouth daily. (Patient not taking: Reported on 07/08/2021)     JARDIANCE 10 MG TABS tablet Take 10 mg by mouth every morning.     Lactobacillus Acid-Pectin  (ACIDOPHILUS/CITRUS PECTIN) TABS Take by mouth. (Patient not taking: Reported on 07/08/2021)     lisinopril (ZESTRIL) 20 MG tablet Take 20 mg by mouth daily. (Patient not taking: Reported on 07/08/2021)     lisinopril (ZESTRIL) 20 MG tablet Take 1 tablet by mouth daily.     meloxicam (MOBIC) 15 MG tablet Take 1 tablet (15 mg total) by mouth daily as needed for pain. 30 tablet 0   metFORMIN (GLUCOPHAGE-XR) 500 MG 24 hr tablet Take 2,000 mg by mouth at bedtime.     metFORMIN (GLUCOPHAGE-XR) 500 MG 24 hr tablet TAKE 4 TABLETS BY MOUTH ONCE DAILY WITH EVENING MEAL (Patient not taking: Reported on 07/08/2021)     methocarbamol (ROBAXIN) 500 MG tablet Take 1 tablet (500 mg total) by mouth 2 (two) times daily as needed. 20 tablet 2   rosuvastatin (CRESTOR) 20 MG tablet Take 20 mg by mouth daily.     No current facility-administered medications on file prior to visit.    No past surgical history on file.  No Known Allergies  BP (!) 158/89    Ht 5' 7.75" (1.721 m)    Wt 216 lb (98 kg)    BMI 33.09 kg/m   [facility] 08/31/2021 10/22/2021  Frequency of aerobic exercise (# of days/week) 3 3  Average time in minutes 20 20  Frequency of strengthening  activities (# of days/week) 0 0    No flowsheet data found.      Objective:  Physical Exam: General: Appears well, no acute distress. Age appropriate. Respiratory: normal effort MSK: Right shoulder exam No swelling, ecchymoses.  No gross deformity. TTP over anterior shoulder. Decreased ROM with external and internal with pain throughout. Positive Hawkins and Neers. Negative Yergasons. Strength 4/5 with empty can and resisted internal/external rotation. NV intact distally. Neuro: alert and oriented Psych: normal affect  Assessment & Plan:  1. Chronic right shoulder pain  Hx of rotator cuff tendinitis. Follow up today s/p bilateral glenohumeral joint injections 3 weeks prior. Patient reports very short lived pain  relief for right shoulder with injections. Also does not have significant pain reductions with oral analgesics and muscle relaxant. Here today for repeat joint injection.   Ultrasound-guided right glenohumeral injection: After sterile prep with Betadine, injected 10 cc 1% lidocaine without epinephrine  using a spinal needle 22g 3in, passing the needle through approach into the glenohumeral joint.  Injectate was seen filling the joint capsule.  He had good immediate relief during the anesthetic phase.  Follow-up as directed.   Consent obtained and verified. Time-out conducted. Noted no overlying erythema, induration, or other signs of local infection. The  right glenohumeral joint was visualized by ultrasound posteriorly.  The overlying skin was prepped in a sterile fashion. Topical analgesic spray: Ethyl chloride. Joint: right glenohumeral Needle: 22 gauge 3 in  Completed without difficulty. Meds: 10 cc 1% lidocaine without epinephrine

## 2021-11-14 NOTE — Progress Notes (Signed)
[facility]: Attending Note: I have reviewed the chart, discussed wit the Sports Medicine Fellow. And the resident I agree with assessment and treatment plan as detailed in the Fellow's note. Dr. Shon Baton performed US guided GHJ injection  Hehad some immediate relief so I think the dx is likely early adhesive capsulitis. Emphasized HEp and rtc 3 weeks for consideration of 2nd serial hu volume GHJ CSI.

## 2021-12-03 ENCOUNTER — Ambulatory Visit: Payer: 59 | Admitting: Family Medicine

## 2021-12-07 ENCOUNTER — Encounter (HOSPITAL_BASED_OUTPATIENT_CLINIC_OR_DEPARTMENT_OTHER): Payer: Self-pay | Admitting: Family Medicine

## 2021-12-07 ENCOUNTER — Other Ambulatory Visit: Payer: Self-pay

## 2021-12-07 ENCOUNTER — Ambulatory Visit (INDEPENDENT_AMBULATORY_CARE_PROVIDER_SITE_OTHER): Payer: 59 | Admitting: Family Medicine

## 2021-12-07 VITALS — BP 140/100 | HR 88 | Ht 68.0 in | Wt 211.4 lb

## 2021-12-07 DIAGNOSIS — E785 Hyperlipidemia, unspecified: Secondary | ICD-10-CM

## 2021-12-07 DIAGNOSIS — E1169 Type 2 diabetes mellitus with other specified complication: Secondary | ICD-10-CM | POA: Insufficient documentation

## 2021-12-07 DIAGNOSIS — I10 Essential (primary) hypertension: Secondary | ICD-10-CM | POA: Diagnosis not present

## 2021-12-07 DIAGNOSIS — F32A Depression, unspecified: Secondary | ICD-10-CM | POA: Diagnosis not present

## 2021-12-07 DIAGNOSIS — F419 Anxiety disorder, unspecified: Secondary | ICD-10-CM

## 2021-12-07 DIAGNOSIS — E119 Type 2 diabetes mellitus without complications: Secondary | ICD-10-CM

## 2021-12-07 NOTE — Patient Instructions (Signed)
?  Medication Instructions:  ?Your physician recommends that you continue on your current medications as directed. Please refer to the Current Medication list given to you today. ?--If you need a refill on any your medications before your next appointment, please call your pharmacy first. If no refills are authorized on file call the office.-- ?Lab Work: ?Your physician has recommended that you have lab work today: A1C ?If you have labs (blood work) drawn today and your tests are completely normal, you will receive your results via MyChart message OR a phone call from our staff.  ?Please ensure you check your voicemail in the event that you authorized detailed messages to be left on a delegated number. If you have any lab test that is abnormal or we need to change your treatment, we will call you to review the results. ? ?Referrals/Procedures/Imaging: ?Cardiologist ?Behavioral health  ? ?Follow-Up: ?Your next appointment:   ?Your physician recommends that you schedule a follow-up appointment in: 4 weekswith Dr. de Peru ? ?You will receive a text message or e-mail with a link to a survey about your care and experience with [place] today! We would greatly appreciate your feedback!  ? ?Thanks for letting us be apart of your health journey!!  ?Primary Care and Sports Medicine  ? ?Dr. Marcy Salvo de [place]  ? ?We encourage you to activate your patient portal called "MyChart".  Sign up information is provided on this After Visit Summary.  MyChart is used to connect with patients for Virtual Visits (Telemedicine).  Patients are able to view lab/test results, encounter notes, upcoming appointments, etc.  Non-urgent messages can be sent to your provider as well. To learn more about what you can do with MyChart, please visit --  [email or web address].   ? ?

## 2021-12-07 NOTE — Progress Notes (Signed)
? ?New Patient Office Visit ? ?Subjective:  ?Patient ID: [place], male    DOB: [birth year]  Age: 51 y.o. MRN: [id number] ? ?CC:  ?Chief Complaint  ?Patient presents with  ? New Patient (Initial Visit)  ?  Patient presents today to establish care. He is under a lot of stress. He is facing becoming homeless.   ? ? ?HPI ?[place] is a 51 year old male presenting to establish in clinic.  He has current concerns as outlined above.  He indicates that he has not had a primary care physician in years.  Past medical history significant for diabetes, hypertension, hyperlipidemia. ? ?DM: Patient reports that he is currently taking empagliflozin, metformin extended release 2000 mg by mouth at bedtime.  Chart review indicates that he has been on Januvia in the past as well as Humalog 75/25, but is not taking these currently.  Patient has not had a recent hemoglobin A1c.  He has not been checking his blood sugars recently.  He is currently denying any polyuria or polydipsia. ? ?Hypertension: Does not regularly check blood pressure at home.  Current medications include lisinopril, Lasix.  Denies any current issues with chest pain or headaches. ? ?Hyperlipidemia: Presently taking simvastatin.  Was taking rosuvastatin at 1 point the past, was transition, he is unsure as to why.  Denies any issues with body aches. ? ?Indicates that in the past he was following with cardiology, most recently was living in [place], we are able to see some documentation through care everywhere.  Chart indicates diagnosis of heart failure in the past which is part of the reason why he was being seen by cardiology. ? ?Patient is originally from the [place] area, however moved away for period of time and most recently moved back in June 2022.  He currently works part-time at [employer].  Outside of work he enjoys reading books, watching movies, listening to music. ? ?Past Medical History:  ?Diagnosis Date  ? Anxiety   ? CHF (congestive heart  failure) (HCC)   ? Depression   ? Diabetes mellitus without complication (HCC)   ? Hypertension   ? ? ?History reviewed. No pertinent surgical history. ? ?History reviewed. No pertinent family history. ? ?Social History  ? ?Socioeconomic History  ? Marital status: Single  ?  Spouse name: Not on file  ? Number of children: Not on file  ? Years of education: Not on file  ? Highest education level: Not on file  ?Occupational History  ? Not on file  ?Tobacco Use  ? Smoking status: Never  ? Smokeless tobacco: Never  ?Vaping Use  ? Vaping Use: Never used  ?Substance and Sexual Activity  ? Alcohol use: Yes  ? Drug use: Never  ? Sexual activity: Not on file  ?Other Topics Concern  ? Not on file  ?Social History Narrative  ? Not on file  ? ?Social Determinants of Health  ? ?Financial Resource Strain: Not on file  ?Food Insecurity: Not on file  ?Transportation Needs: Not on file  ?Physical Activity: Not on file  ?Stress: Not on file  ?Social Connections: Not on file  ?Intimate Partner Violence: Not on file  ? ? ?Objective:  ? ?Today's Vitals: BP (!) 140/100   Pulse 88   Ht 5\' 8"  (1.727 m)   Wt 211 lb 6.4 oz (95.9 kg)   SpO2 99%   BMI 32.14 kg/m?  ? ?Physical Exam ? ?51 year old male in no acute distress ?Cardiovascular exam with regular  rate and rhythm, no murmur appreciated ?Lungs clear to auscultation bilaterally ? ?Assessment & Plan:  ? ?Problem List Items Addressed This Visit   ? ?  ? Cardiovascular and Mediastinum  ? Essential hypertension - Primary  ?  Blood pressure not at goal in office today ?Continue with current medication at present, likely will need to add additional agent in the future ?Recommend intermittent monitoring at home and bring log in 2 clinic for review ?Recommend DASH diet ?  ?  ? Relevant Orders  ? Ambulatory referral to Cardiology  ?  ? Endocrine  ? Diabetes mellitus (HCC)  ?  Uncertain control at this time ?Continue with current medications ?We will check hemoglobin A1c today as well as urine  microalbumin/creatinine ratio ?Likely patient will need to be placed on additional pharmacotherapy for optimal control of A1c and reduction of risk related to diabetes ?  ?  ? Relevant Orders  ? Hemoglobin A1c (Completed)  ? Urine Microalbumin w/creat. ratio (Completed)  ?  ? Other  ? Anxiety  ?  Currently taking hydroxyzine related to this, was taking Lexapro in the past ?We will provide referral to psychiatry for further evaluation and management of underlying anxiety and depression ?  ?  ? Depression  ? Relevant Orders  ? Ambulatory referral to Psychiatry  ? Hyperlipidemia  ?  Tolerating simvastatin at present, can continue with this ?We will check lipid panel in order to assess current status ?  ?  ? Relevant Orders  ? Ambulatory referral to Cardiology  ? ? ?Outpatient Encounter Medications as of 12/07/2021  ?Medication Sig  ? acetaminophen (TYLENOL) 325 MG tablet Take by mouth. (Patient not taking: Reported on 07/08/2021)  ? albuterol (VENTOLIN HFA) 108 (90 Base) MCG/ACT inhaler Inhale 2 puffs into the lungs every 4 (four) hours as needed for wheezing. (Patient not taking: Reported on 07/08/2021)  ? cephALEXin (KEFLEX) 500 MG capsule Take 500 mg by mouth 4 (four) times daily.  ? escitalopram (LEXAPRO) 10 MG tablet Take by mouth.  ? furosemide (LASIX) 40 MG tablet Take 40 mg by mouth daily.  ? hydrOXYzine (ATARAX/VISTARIL) 25 MG tablet Take by mouth. (Patient not taking: Reported on 07/08/2021)  ? ibuprofen (ADVIL) 800 MG tablet Take 800 mg by mouth every 8 (eight) hours as needed.  ? insulin lispro protamine-lispro (HUMALOG 75/25 MIX) (75-25) 100 UNIT/ML SUSP injection Inject 26 Units into the skin 2 (two) times daily with breakfast and lunch. (Patient not taking: Reported on 07/08/2021)  ? JANUVIA 100 MG tablet Take 100 mg by mouth daily. (Patient not taking: Reported on 07/08/2021)  ? JARDIANCE 10 MG TABS tablet Take 10 mg by mouth every morning.  ? Lactobacillus Acid-Pectin (ACIDOPHILUS/CITRUS PECTIN) TABS Take by  mouth. (Patient not taking: Reported on 07/08/2021)  ? lisinopril (ZESTRIL) 20 MG tablet Take 20 mg by mouth daily. (Patient not taking: Reported on 07/08/2021)  ? lisinopril (ZESTRIL) 20 MG tablet Take 1 tablet by mouth daily.  ? meloxicam (MOBIC) 15 MG tablet Take 1 tablet (15 mg total) by mouth daily as needed for pain.  ? metFORMIN (GLUCOPHAGE-XR) 500 MG 24 hr tablet Take 2,000 mg by mouth at bedtime.  ? metFORMIN (GLUCOPHAGE-XR) 500 MG 24 hr tablet TAKE 4 TABLETS BY MOUTH ONCE DAILY WITH EVENING MEAL (Patient not taking: Reported on 07/08/2021)  ? methocarbamol (ROBAXIN) 500 MG tablet Take 1 tablet (500 mg total) by mouth 2 (two) times daily as needed.  ? rosuvastatin (CRESTOR) 20 MG tablet Take 20 mg  by mouth daily.  ? ?No facility-administered encounter medications on file as of 12/07/2021.  ? ? ?Follow-up: Return in about 4 weeks (around 01/04/2022).  Plan for close follow-up to monitor above and review labs ? ?Keita Demarco J De Peru, MD ? ?

## 2021-12-08 LAB — HEMOGLOBIN A1C
Est. average glucose Bld gHb Est-mCnc: 260 mg/dL
Hgb A1c MFr Bld: 10.7 % — ABNORMAL HIGH (ref 4.8–5.6)

## 2021-12-08 LAB — MICROALBUMIN / CREATININE URINE RATIO
Creatinine, Urine: 105.8 mg/dL
Microalb/Creat Ratio: 1892 mg/g creat — ABNORMAL HIGH (ref 0–29)
Microalbumin, Urine: 2001.4 ug/mL

## 2021-12-09 NOTE — Assessment & Plan Note (Signed)
Currently taking hydroxyzine related to this, was taking Lexapro in the past ?We will provide referral to psychiatry for further evaluation and management of underlying anxiety and depression ?

## 2021-12-09 NOTE — Assessment & Plan Note (Signed)
Tolerating simvastatin at present, can continue with this ?We will check lipid panel in order to assess current status ?

## 2021-12-09 NOTE — Assessment & Plan Note (Signed)
Blood pressure not at goal in office today ?Continue with current medication at present, likely will need to add additional agent in the future ?Recommend intermittent monitoring at home and bring log in 2 clinic for review ?Recommend DASH diet ?

## 2021-12-09 NOTE — Assessment & Plan Note (Signed)
Uncertain control at this time ?Continue with current medications ?We will check hemoglobin A1c today as well as urine microalbumin/creatinine ratio ?Likely patient will need to be placed on additional pharmacotherapy for optimal control of A1c and reduction of risk related to diabetes ?

## 2021-12-27 ENCOUNTER — Encounter (HOSPITAL_BASED_OUTPATIENT_CLINIC_OR_DEPARTMENT_OTHER): Payer: Self-pay | Admitting: Family Medicine

## 2022-01-18 ENCOUNTER — Encounter (HOSPITAL_BASED_OUTPATIENT_CLINIC_OR_DEPARTMENT_OTHER): Payer: Self-pay | Admitting: Family Medicine

## 2022-01-18 ENCOUNTER — Other Ambulatory Visit (HOSPITAL_BASED_OUTPATIENT_CLINIC_OR_DEPARTMENT_OTHER): Payer: Self-pay

## 2022-01-18 ENCOUNTER — Ambulatory Visit (INDEPENDENT_AMBULATORY_CARE_PROVIDER_SITE_OTHER): Payer: 59 | Admitting: Family Medicine

## 2022-01-18 VITALS — BP 170/108 | HR 97 | Temp 97.7°F | Ht 67.75 in | Wt 208.7 lb

## 2022-01-18 DIAGNOSIS — I517 Cardiomegaly: Secondary | ICD-10-CM | POA: Diagnosis not present

## 2022-01-18 DIAGNOSIS — I10 Essential (primary) hypertension: Secondary | ICD-10-CM | POA: Diagnosis not present

## 2022-01-18 DIAGNOSIS — F32A Depression, unspecified: Secondary | ICD-10-CM

## 2022-01-18 DIAGNOSIS — E119 Type 2 diabetes mellitus without complications: Secondary | ICD-10-CM

## 2022-01-18 MED ORDER — LANTUS SOLOSTAR 100 UNIT/ML ~~LOC~~ SOPN
10.0000 [IU] | PEN_INJECTOR | Freq: Every day | SUBCUTANEOUS | 1 refills | Status: DC
  Filled 2022-01-18: qty 15, 150d supply, fill #0

## 2022-01-18 MED ORDER — "PEN NEEDLES 3/16"" 31G X 5 MM MISC"
1.0000 | Freq: Every day | 1 refills | Status: DC
  Filled 2022-01-18: qty 100, 90d supply, fill #0

## 2022-01-18 MED ORDER — INSULIN GLARGINE-YFGN 100 UNIT/ML ~~LOC~~ SOPN
10.0000 [IU] | PEN_INJECTOR | Freq: Every day | SUBCUTANEOUS | 1 refills | Status: DC
  Filled 2022-01-18: qty 9, 90d supply, fill #0

## 2022-01-18 MED ORDER — SERTRALINE HCL 25 MG PO TABS
25.0000 mg | ORAL_TABLET | Freq: Every day | ORAL | 1 refills | Status: DC
  Filled 2022-01-18: qty 30, 30d supply, fill #0

## 2022-01-18 NOTE — Progress Notes (Signed)
? ? ?Procedures performed today:   ? ?None. ? ?Independent interpretation of notes and tests performed by another provider:  ? ?None. ? ?Brief History, Exam, Impression, and Recommendations:   ? ?BP (!) 170/108   Pulse 97   Temp 97.7 ?F (36.5 ?C)   Ht 5' 7.75" (1.721 m)   Wt 208 lb 11.2 oz (94.7 kg)   SpO2 97%   BMI 31.97 kg/m?  ? ?Diabetes mellitus (HCC) ?Recent hemoglobin A1c found to be notably elevated at 10.7%. ?He does continue with empagliflozin and metformin ?In the past he has been on other medications including Januvia, glipizide, Humalog 75/25, Lantus.  Discussed that with elevated A1c, patient would need additional pharmacotherapy to better control blood sugars and work towards goal of hemoglobin A1c of 7.0% ?Patient is willing to proceed with additional agent, would be open to addition of long-acting insulin.  Specifically discussed possibility of adding insulin glargine as a long-acting insulin and titrating dose to achieve target FBG ?We will start with 10 units daily and I advised patient on appropriate titration schedule.  Recommend monitoring fasting blood sugars each morning and taking the average every 3 days.  If this average remains greater than 130, would increase insulin glargine dose by 2 units and then continue with assessing 3-day average of FBG once FBG averages remain between 80 and 130, would continue with that dose of insulin.  Likewise if FBG is less than 80, would decrease insulin glargine dose by 2 units and continue monitoring ?Continue with empagliflozin and metformin, discussed that these medications can have additional protective benefits in addition to their benefit in controlling blood sugars ?At follow-up visits, will need to complete neuropathy screening, follow-up on retinopathy screening ?He did have albuminuria on prior testing, will need to follow-up in the next 2 to 4 months with repeat testing ? ?Depression ?Previously was referred to psychiatry, however he has  not heard from the office.  Review of referral shows that it has been closed, although there is no specific documentation as to why this is happened.  Patient would be interested in establishing with psychiatrist, he is also interested in counseling ?He has been on Lexapro in the past, however did not tolerate this medication due to GI side effects.  Discussed that this side effect is possible with all medications within the SSRI category, however it is possible that he may tolerate a different medication better and is open to trying an alternative ?We will proceed with initiation of sertraline 25 mg once daily and monitor response to this ?We will plan for follow-up in about 2 weeks to assess for any potential side effects and determine if we will continue with same dose or titrate this dose ?Did discuss potential risk for suicidal ideation and what to do if this does occur, particularly to either contact the office or present to the emergency department or behavioral health Hospital ?We will place referral to Dr. Bosie Clos ?We will also place referral to psychiatry for patient to establish ? ?Essential hypertension ?Blood pressure elevated in office today ?He is not having any current chest pain or headaches ?Poorly controlled diabetes is certainly not helping to ensure adequate control of blood pressure, will need to work closely to better control blood sugars which can help with blood pressure control ?He is currently taking lisinopril 20 mg, may need additional agent to better control symptoms ?He was following with cardiology previously when he lived in [place], on chart review it appears that this was related  to hypertension, hyperlipidemia, cardiomegaly.  He is interested in referral to cardiologist locally to establish, referral placed today ? ?Cardiomegaly ?Was previously following with cardiology regarding this, would like referral to establish with provider locally, referral placed today ? ?Spent 45  minutes on this patient encounter, including preparation, chart review, face-to-face counseling with patient and coordination of care, and documentation of encounter ? ?Plan for follow-up in about 2 weeks to monitor progress with sertraline, also will discuss fasting blood sugars and insulin titration ? ? ?___________________________________________ ?Leroy Mineer de [place], MD, ABFM, CAQSM ?Primary Care and Sports Medicine ?[facility] ?

## 2022-01-18 NOTE — Assessment & Plan Note (Signed)
Previously was referred to psychiatry, however he has not heard from the office.  Review of referral shows that it has been closed, although there is no specific documentation as to why this is happened.  Patient would be interested in establishing with psychiatrist, he is also interested in counseling ?He has been on Lexapro in the past, however did not tolerate this medication due to GI side effects.  Discussed that this side effect is possible with all medications within the SSRI category, however it is possible that he may tolerate a different medication better and is open to trying an alternative ?We will proceed with initiation of sertraline 25 mg once daily and monitor response to this ?We will plan for follow-up in about 2 weeks to assess for any potential side effects and determine if we will continue with same dose or titrate this dose ?Did discuss potential risk for suicidal ideation and what to do if this does occur, particularly to either contact the office or present to the emergency department or behavioral health Hospital ?We will place referral to Dr. Bosie Clos ?We will also place referral to psychiatry for patient to establish ?

## 2022-01-18 NOTE — Assessment & Plan Note (Signed)
Blood pressure elevated in office today ?He is not having any current chest pain or headaches ?Poorly controlled diabetes is certainly not helping to ensure adequate control of blood pressure, will need to work closely to better control blood sugars which can help with blood pressure control ?He is currently taking lisinopril 20 mg, may need additional agent to better control symptoms ?He was following with cardiology previously when he lived in [place], on chart review it appears that this was related to hypertension, hyperlipidemia, cardiomegaly.  He is interested in referral to cardiologist locally to establish, referral placed today ?

## 2022-01-18 NOTE — Assessment & Plan Note (Signed)
Recent hemoglobin A1c found to be notably elevated at 10.7%. ?He does continue with empagliflozin and metformin ?In the past he has been on other medications including Januvia, glipizide, Humalog 75/25, Lantus.  Discussed that with elevated A1c, patient would need additional pharmacotherapy to better control blood sugars and work towards goal of hemoglobin A1c of 7.0% ?Patient is willing to proceed with additional agent, would be open to addition of long-acting insulin.  Specifically discussed possibility of adding insulin glargine as a long-acting insulin and titrating dose to achieve target FBG ?We will start with 10 units daily and I advised patient on appropriate titration schedule.  Recommend monitoring fasting blood sugars each morning and taking the average every 3 days.  If this average remains greater than 130, would increase insulin glargine dose by 2 units and then continue with assessing 3-day average of FBG once FBG averages remain between 80 and 130, would continue with that dose of insulin.  Likewise if FBG is less than 80, would decrease insulin glargine dose by 2 units and continue monitoring ?Continue with empagliflozin and metformin, discussed that these medications can have additional protective benefits in addition to their benefit in controlling blood sugars ?At follow-up visits, will need to complete neuropathy screening, follow-up on retinopathy screening ?He did have albuminuria on prior testing, will need to follow-up in the next 2 to 4 months with repeat testing ?

## 2022-01-18 NOTE — Patient Instructions (Signed)

## 2022-01-18 NOTE — Assessment & Plan Note (Signed)
Was previously following with cardiology regarding this, would like referral to establish with provider locally, referral placed today ?

## 2022-01-20 ENCOUNTER — Encounter: Payer: Self-pay | Admitting: *Deleted

## 2022-02-01 ENCOUNTER — Ambulatory Visit (INDEPENDENT_AMBULATORY_CARE_PROVIDER_SITE_OTHER): Payer: 59 | Admitting: Family Medicine

## 2022-02-01 ENCOUNTER — Encounter (HOSPITAL_BASED_OUTPATIENT_CLINIC_OR_DEPARTMENT_OTHER): Payer: Self-pay | Admitting: Family Medicine

## 2022-02-01 VITALS — BP 168/94 | HR 87 | Ht 68.0 in | Wt 211.0 lb

## 2022-02-01 DIAGNOSIS — F32A Depression, unspecified: Secondary | ICD-10-CM | POA: Diagnosis not present

## 2022-02-01 DIAGNOSIS — E1165 Type 2 diabetes mellitus with hyperglycemia: Secondary | ICD-10-CM | POA: Diagnosis not present

## 2022-02-01 DIAGNOSIS — I517 Cardiomegaly: Secondary | ICD-10-CM | POA: Diagnosis not present

## 2022-02-01 DIAGNOSIS — I10 Essential (primary) hypertension: Secondary | ICD-10-CM

## 2022-02-01 NOTE — Progress Notes (Signed)
**Note Simmons-Identified via Obfuscation** ? ? ?  Procedures performed today:   ? ?None. ? ?Independent interpretation of notes and tests performed by another provider:  ? ?None. ? ?Brief History, Exam, Impression, and Recommendations:   ? ?BP (!) 168/94   Pulse 87   Ht 5\' 8"  (1.727 m)   Wt 211 lb (95.7 kg)   SpO2 99%   BMI 32.08 kg/m?  ? ?Diabetes mellitus (HCC) ?Patient continues with Jardiance 10 mg daily, metformin 2000 mg daily.  He also continues with insulin glargine, has been administering 10 units daily.  Has not been checking fasting blood sugars regularly.  No symptoms suggestive of low blood sugar readings such as sweats, tremors, feeling lightheaded or dizzy ?Hemoglobin A1c was notably elevated at 10.7% ?Feel that further titration of insulin will be required, however this will be based on blood sugar readings and so stressed the importance of checking FBG regularly ?For close follow-up in about 3 weeks to monitor progress with this ?If difficulty with medication adherence or achieving target A1c, consider referral to endocrinology ? ?Depression ?Patient did start sertraline.  He reports that he did have some GI symptoms, nausea, episode of vomiting.  Given that he also started insulin glargine around the same time, he was unsure which medication could be contributing this.  He stopped both medications and then gradually reintroduced both, has not had any further GI symptoms.  Does indicate that he has been taking sertraline every other day typically.  He has had some issues with escitalopram in the past in regards to GI side effects ?Discussed options with patient, he would like to continue with sertraline as he is not having any side effects at present.  Did discuss that this would need to be a daily medication, however if side effects are too bothersome, may need to consider alternative class of antidepressant ?Patient was referred to psychiatry, has not able to schedule yet, it does appear that due to psychiatry office not able to get in  contact with patient ?New referral for psychiatry placed today and patient provided with recommendation for the office to reach out about scheduling new patient appointment ? ?Cardiomegaly ?Previously referred to cardiology, it appears they attempted to contact patient without success and thus referral was closed ?Subsequent referral was placed, however cardiology office marked this as duplicate and close referral ?New referral placed today and patient provided with contact information for the office to schedule establishing visit ? ?Plan for close follow-up in about 3 weeks ? ? ?___________________________________________ ?Leroy Simmons , MD, ABFM, CAQSM ?Primary Care and Sports Medicine ?[facility] ?

## 2022-02-01 NOTE — Assessment & Plan Note (Signed)
Patient continues with Jardiance 10 mg daily, metformin 2000 mg daily.  He also continues with insulin glargine, has been administering 10 units daily.  Has not been checking fasting blood sugars regularly.  No symptoms suggestive of low blood sugar readings such as sweats, tremors, feeling lightheaded or dizzy ?Hemoglobin A1c was notably elevated at 10.7% ?Feel that further titration of insulin will be required, however this will be based on blood sugar readings and so stressed the importance of checking FBG regularly ?For close follow-up in about 3 weeks to monitor progress with this ?If difficulty with medication adherence or achieving target A1c, consider referral to endocrinology ?

## 2022-02-01 NOTE — Assessment & Plan Note (Signed)
Previously referred to cardiology, it appears they attempted to contact patient without success and thus referral was closed ?Subsequent referral was placed, however cardiology office marked this as duplicate and close referral ?New referral placed today and patient provided with contact information for the office to schedule establishing visit ?

## 2022-02-01 NOTE — Assessment & Plan Note (Signed)
Patient did start sertraline.  He reports that he did have some GI symptoms, nausea, episode of vomiting.  Given that he also started insulin glargine around the same time, he was unsure which medication could be contributing this.  He stopped both medications and then gradually reintroduced both, has not had any further GI symptoms.  Does indicate that he has been taking sertraline every other day typically.  He has had some issues with escitalopram in the past in regards to GI side effects ?Discussed options with patient, he would like to continue with sertraline as he is not having any side effects at present.  Did discuss that this would need to be a daily medication, however if side effects are too bothersome, may need to consider alternative class of antidepressant ?Patient was referred to psychiatry, has not able to schedule yet, it does appear that due to psychiatry office not able to get in contact with patient ?New referral for psychiatry placed today and patient provided with recommendation for the office to reach out about scheduling new patient appointment ?

## 2022-02-22 ENCOUNTER — Ambulatory Visit (HOSPITAL_BASED_OUTPATIENT_CLINIC_OR_DEPARTMENT_OTHER): Payer: 59 | Admitting: Family Medicine

## 2022-03-08 ENCOUNTER — Ambulatory Visit: Payer: 59 | Admitting: Psychologist

## 2022-03-10 ENCOUNTER — Encounter (HOSPITAL_BASED_OUTPATIENT_CLINIC_OR_DEPARTMENT_OTHER): Payer: Self-pay | Admitting: Family Medicine

## 2022-03-10 ENCOUNTER — Other Ambulatory Visit (HOSPITAL_BASED_OUTPATIENT_CLINIC_OR_DEPARTMENT_OTHER): Payer: Self-pay

## 2022-03-10 ENCOUNTER — Ambulatory Visit (INDEPENDENT_AMBULATORY_CARE_PROVIDER_SITE_OTHER): Payer: 59 | Admitting: Family Medicine

## 2022-03-10 VITALS — BP 178/105 | HR 88 | Ht 68.0 in | Wt 215.8 lb

## 2022-03-10 DIAGNOSIS — I517 Cardiomegaly: Secondary | ICD-10-CM | POA: Diagnosis not present

## 2022-03-10 DIAGNOSIS — E1165 Type 2 diabetes mellitus with hyperglycemia: Secondary | ICD-10-CM

## 2022-03-10 DIAGNOSIS — Z794 Long term (current) use of insulin: Secondary | ICD-10-CM

## 2022-03-10 DIAGNOSIS — F32A Depression, unspecified: Secondary | ICD-10-CM

## 2022-03-10 DIAGNOSIS — I10 Essential (primary) hypertension: Secondary | ICD-10-CM

## 2022-03-10 DIAGNOSIS — F419 Anxiety disorder, unspecified: Secondary | ICD-10-CM | POA: Diagnosis not present

## 2022-03-10 MED ORDER — BUSPIRONE HCL 5 MG PO TABS
7.5000 mg | ORAL_TABLET | Freq: Two times a day (BID) | ORAL | 0 refills | Status: DC
  Filled 2022-03-10: qty 90, 30d supply, fill #0

## 2022-03-10 MED ORDER — AMLODIPINE BESYLATE 5 MG PO TABS
5.0000 mg | ORAL_TABLET | Freq: Every day | ORAL | 1 refills | Status: DC
  Filled 2022-03-10: qty 90, 90d supply, fill #0

## 2022-03-10 NOTE — Assessment & Plan Note (Signed)
Patient continues with sertraline 25 mg daily.  Feels that this has been helping his depressive symptoms, not currently having any suicidal ideation.  Unfortunately, he feels that he is still having a lot of breakthrough anxiety symptoms with current medication/dosage Discussed options with patient, he would be interested in starting BuSpar as augmentation with sertraline to try to help better control symptoms.  Discussed that we will start with low-dose twice daily and proceed with titration as indicated Patient was also previously referred to psychiatry, on review of chart it appears that they did reach out to him but did not receive an answer and thus referral was closed.  Again provided patient with contact information for psychiatry office for him to reach out about scheduling establishing visit

## 2022-03-10 NOTE — Assessment & Plan Note (Signed)
Patient continues with Jardiance, metformin, insulin glargine.  Continues with 10 units of insulin glargine daily.  Unfortunately, has not been checking fasting blood sugars regularly, but he feels as though his blood sugar is still not at goal No current issues with polyuria or polydipsia Unfortunately, cannot advise on insulin titration due to lack of fasting blood sugar readings, encouraged patient to resume checking fasting blood sugar daily We will continue with current regimen of medication Given that he has been on this current regimen for about 6 weeks, would plan to recheck hemoglobin A1c in about 6 weeks from now

## 2022-03-10 NOTE — Progress Notes (Signed)
    Procedures performed today:    None.  Independent interpretation of notes and tests performed by another provider:   None.  Brief History, Exam, Impression, and Recommendations:    BP (!) 178/105   Pulse 88   Ht 5\' 8"  (1.727 m)   Wt 215 lb 12.8 oz (97.9 kg)   SpO2 96%   BMI 32.81 kg/m   Essential hypertension Blood pressure elevated in office today.  Has not been checking regularly at home.  No issues with chest pain or headaches.  Prior readings in the office have also been elevated recently.  He continues with lisinopril 20 mg daily. Discussed that given persistent elevations in the office, feel that additional antihypertensive treatment would be indicated.  Patient voices agreement.  We will proceed with starting amlodipine 5 mg daily and monitor blood pressure Recommend intermittent monitoring at home, DASH diet  Cardiomegaly Previously referred to cardiology, patient unfortunately has not answer their calls as of yet to be able to schedule this and referrals have been closed previously.  Again provided patient with contact information to call the office to schedule establishing visit  Diabetes mellitus [facility]) Patient continues with Jardiance, metformin, insulin glargine.  Continues with 10 units of insulin glargine daily.  Unfortunately, has not been checking fasting blood sugars regularly, but he feels as though his blood sugar is still not at goal No current issues with polyuria or polydipsia Unfortunately, cannot advise on insulin titration due to lack of fasting blood sugar readings, encouraged patient to resume checking fasting blood sugar daily We will continue with current regimen of medication Given that he has been on this current regimen for about 6 weeks, would plan to recheck hemoglobin A1c in about 6 weeks from now  Anxiety and depression Patient continues with sertraline 25 mg daily.  Feels that this has been helping his depressive symptoms, not currently  having any suicidal ideation.  Unfortunately, he feels that he is still having a lot of breakthrough anxiety symptoms with current medication/dosage Discussed options with patient, he would be interested in starting BuSpar as augmentation with sertraline to try to help better control symptoms.  Discussed that we will start with low-dose twice daily and proceed with titration as indicated Patient was also previously referred to psychiatry, on review of chart it appears that they did reach out to him but did not receive an answer and thus referral was closed.  Again provided patient with contact information for psychiatry office for him to reach out about scheduling establishing visit  Return in about 3 weeks (around 03/31/2022) for Anxiety, DM.   ___________________________________________ Sasha Rueth de 04/02/2022, MD, ABFM, [facility] Primary Care and Sports Medicine [facility]

## 2022-03-10 NOTE — Assessment & Plan Note (Signed)
Blood pressure elevated in office today.  Has not been checking regularly at home.  No issues with chest pain or headaches.  Prior readings in the office have also been elevated recently.  He continues with lisinopril 20 mg daily. Discussed that given persistent elevations in the office, feel that additional antihypertensive treatment would be indicated.  Patient voices agreement.  We will proceed with starting amlodipine 5 mg daily and monitor blood pressure Recommend intermittent monitoring at home, DASH diet

## 2022-03-10 NOTE — Assessment & Plan Note (Signed)
Previously referred to cardiology, patient unfortunately has not answer their calls as of yet to be able to schedule this and referrals have been closed previously.  Again provided patient with contact information to call the office to schedule establishing visit

## 2022-03-16 ENCOUNTER — Emergency Department (HOSPITAL_COMMUNITY): Payer: 59

## 2022-03-16 ENCOUNTER — Inpatient Hospital Stay (HOSPITAL_COMMUNITY)
Admission: EM | Admit: 2022-03-16 | Discharge: 2022-03-24 | DRG: 286 | Disposition: A | Discharge status: Home / Self Care | Payer: 59 | Attending: Internal Medicine | Admitting: Internal Medicine

## 2022-03-16 ENCOUNTER — Encounter (HOSPITAL_COMMUNITY): Payer: Self-pay

## 2022-03-16 ENCOUNTER — Other Ambulatory Visit: Payer: Self-pay

## 2022-03-16 DIAGNOSIS — L03116 Cellulitis of left lower limb: Secondary | ICD-10-CM | POA: Diagnosis present

## 2022-03-16 DIAGNOSIS — I5033 Acute on chronic diastolic (congestive) heart failure: Secondary | ICD-10-CM | POA: Diagnosis present

## 2022-03-16 DIAGNOSIS — E876 Hypokalemia: Secondary | ICD-10-CM | POA: Diagnosis present

## 2022-03-16 DIAGNOSIS — Z23 Encounter for immunization: Secondary | ICD-10-CM

## 2022-03-16 DIAGNOSIS — E1165 Type 2 diabetes mellitus with hyperglycemia: Secondary | ICD-10-CM | POA: Diagnosis present

## 2022-03-16 DIAGNOSIS — E1122 Type 2 diabetes mellitus with diabetic chronic kidney disease: Secondary | ICD-10-CM | POA: Diagnosis present

## 2022-03-16 DIAGNOSIS — I10 Essential (primary) hypertension: Secondary | ICD-10-CM | POA: Diagnosis present

## 2022-03-16 DIAGNOSIS — I509 Heart failure, unspecified: Secondary | ICD-10-CM | POA: Diagnosis not present

## 2022-03-16 DIAGNOSIS — Z91148 Patient's other noncompliance with medication regimen for other reason: Secondary | ICD-10-CM

## 2022-03-16 DIAGNOSIS — F419 Anxiety disorder, unspecified: Secondary | ICD-10-CM | POA: Diagnosis present

## 2022-03-16 DIAGNOSIS — L03115 Cellulitis of right lower limb: Secondary | ICD-10-CM

## 2022-03-16 DIAGNOSIS — E7800 Pure hypercholesterolemia, unspecified: Secondary | ICD-10-CM | POA: Diagnosis present

## 2022-03-16 DIAGNOSIS — Z7251 High risk heterosexual behavior: Secondary | ICD-10-CM

## 2022-03-16 DIAGNOSIS — Z7984 Long term (current) use of oral hypoglycemic drugs: Secondary | ICD-10-CM

## 2022-03-16 DIAGNOSIS — F32A Depression, unspecified: Secondary | ICD-10-CM | POA: Diagnosis present

## 2022-03-16 DIAGNOSIS — I130 Hypertensive heart and chronic kidney disease with heart failure and stage 1 through stage 4 chronic kidney disease, or unspecified chronic kidney disease: Secondary | ICD-10-CM | POA: Diagnosis not present

## 2022-03-16 DIAGNOSIS — E1159 Type 2 diabetes mellitus with other circulatory complications: Secondary | ICD-10-CM | POA: Diagnosis present

## 2022-03-16 DIAGNOSIS — I2510 Atherosclerotic heart disease of native coronary artery without angina pectoris: Secondary | ICD-10-CM | POA: Diagnosis present

## 2022-03-16 DIAGNOSIS — N179 Acute kidney failure, unspecified: Secondary | ICD-10-CM | POA: Diagnosis present

## 2022-03-16 DIAGNOSIS — N189 Chronic kidney disease, unspecified: Secondary | ICD-10-CM | POA: Diagnosis present

## 2022-03-16 DIAGNOSIS — Z8249 Family history of ischemic heart disease and other diseases of the circulatory system: Secondary | ICD-10-CM

## 2022-03-16 DIAGNOSIS — Z79899 Other long term (current) drug therapy: Secondary | ICD-10-CM

## 2022-03-16 DIAGNOSIS — N1831 Chronic kidney disease, stage 3a: Secondary | ICD-10-CM | POA: Diagnosis present

## 2022-03-16 DIAGNOSIS — Z794 Long term (current) use of insulin: Secondary | ICD-10-CM

## 2022-03-16 DIAGNOSIS — E119 Type 2 diabetes mellitus without complications: Secondary | ICD-10-CM

## 2022-03-16 LAB — CBC WITH DIFFERENTIAL/PLATELET
Abs Immature Granulocytes: 0.09 10*3/uL — ABNORMAL HIGH (ref 0.00–0.07)
Basophils Absolute: 0 10*3/uL (ref 0.0–0.1)
Basophils Relative: 0 %
Eosinophils Absolute: 0 10*3/uL (ref 0.0–0.5)
Eosinophils Relative: 0 %
HCT: 40 % (ref 39.0–52.0)
Hemoglobin: 13.7 g/dL (ref 13.0–17.0)
Immature Granulocytes: 1 %
Lymphocytes Relative: 9 %
Lymphs Abs: 1.7 10*3/uL (ref 0.7–4.0)
MCH: 30.9 pg (ref 26.0–34.0)
MCHC: 34.3 g/dL (ref 30.0–36.0)
MCV: 90.3 fL (ref 80.0–100.0)
Monocytes Absolute: 1.1 10*3/uL — ABNORMAL HIGH (ref 0.1–1.0)
Monocytes Relative: 6 %
Neutro Abs: 16.1 10*3/uL — ABNORMAL HIGH (ref 1.7–7.7)
Neutrophils Relative %: 84 %
Platelets: 335 10*3/uL (ref 150–400)
RBC: 4.43 MIL/uL (ref 4.22–5.81)
RDW: 11.4 % — ABNORMAL LOW (ref 11.5–15.5)
WBC: 19 10*3/uL — ABNORMAL HIGH (ref 4.0–10.5)
nRBC: 0 % (ref 0.0–0.2)

## 2022-03-16 LAB — BRAIN NATRIURETIC PEPTIDE: B Natriuretic Peptide: 57.1 pg/mL (ref 0.0–100.0)

## 2022-03-16 LAB — COMPREHENSIVE METABOLIC PANEL
ALT: 19 U/L (ref 0–44)
AST: 15 U/L (ref 15–41)
Albumin: 3.5 g/dL (ref 3.5–5.0)
Alkaline Phosphatase: 103 U/L (ref 38–126)
Anion gap: 14 (ref 5–15)
BUN: 25 mg/dL — ABNORMAL HIGH (ref 6–20)
CO2: 25 mmol/L (ref 22–32)
Calcium: 9.3 mg/dL (ref 8.9–10.3)
Chloride: 97 mmol/L — ABNORMAL LOW (ref 98–111)
Creatinine, Ser: 1.54 mg/dL — ABNORMAL HIGH (ref 0.61–1.24)
GFR, Estimated: 55 mL/min — ABNORMAL LOW (ref >60)
Glucose, Bld: 366 mg/dL — ABNORMAL HIGH (ref 70–99)
Potassium: 3.3 mmol/L — ABNORMAL LOW (ref 3.5–5.1)
Sodium: 136 mmol/L (ref 135–145)
Total Bilirubin: 1.2 mg/dL (ref 0.3–1.2)
Total Protein: 7.4 g/dL (ref 6.5–8.1)

## 2022-03-16 LAB — TROPONIN I (HIGH SENSITIVITY)
Troponin I (High Sensitivity): 30 ng/L — ABNORMAL HIGH (ref <18)
Troponin I (High Sensitivity): 31 ng/L — ABNORMAL HIGH (ref <18)

## 2022-03-16 NOTE — ED Triage Notes (Signed)
Pt to ED POV from home.  Pt woke up this am with chest pain, lightheadedness, dizziness and nausea which has continued throughout the day.  Pt states he also has swelling in feet.  Pt A&Ox4, NAD noted.

## 2022-03-16 NOTE — ED Provider Triage Note (Signed)
Emergency Medicine Provider Triage Evaluation Note  AZAIAH LICCIARDI , a 51 y.o. male  was evaluated in triage.  Pt complains of chest pain and shortness of breath along with peripheral edema.  Chest pain as of today and peripheral edema over the past several days.  Without other complaints..  Review of Systems  Positive: As above Negative: As above  Physical Exam  BP (!) 158/92 (BP Location: Right Arm)   Pulse (!) 105   Temp 99.5 F (37.5 C)   Resp 16   Ht 5\' 8"  (1.727 m)   Wt 98 kg   SpO2 100%   BMI 32.84 kg/m  Gen:   Awake, no distress   Resp:  Normal effort  MSK:   Moves extremities without difficulty  Other:  Bilateral peripheral edema.  Medical Decision Making  Medically screening exam initiated at 8:10 PM.  Appropriate orders placed.  LESS WOOLSEY was informed that the remainder of the evaluation will be completed by another provider, this initial triage assessment does not replace that evaluation, and the importance of remaining in the ED until their evaluation is complete.     Janace Hoard, PA-C 03/16/22 2010

## 2022-03-17 ENCOUNTER — Emergency Department (HOSPITAL_COMMUNITY): Payer: 59

## 2022-03-17 ENCOUNTER — Observation Stay (HOSPITAL_COMMUNITY): Payer: 59

## 2022-03-17 ENCOUNTER — Emergency Department (HOSPITAL_BASED_OUTPATIENT_CLINIC_OR_DEPARTMENT_OTHER): Payer: 59

## 2022-03-17 ENCOUNTER — Encounter (HOSPITAL_COMMUNITY): Payer: Self-pay | Admitting: Cardiology

## 2022-03-17 DIAGNOSIS — I5033 Acute on chronic diastolic (congestive) heart failure: Secondary | ICD-10-CM

## 2022-03-17 DIAGNOSIS — I509 Heart failure, unspecified: Secondary | ICD-10-CM

## 2022-03-17 DIAGNOSIS — I5032 Chronic diastolic (congestive) heart failure: Secondary | ICD-10-CM | POA: Diagnosis not present

## 2022-03-17 LAB — CBC WITH DIFFERENTIAL/PLATELET
Abs Immature Granulocytes: 0.04 10*3/uL (ref 0.00–0.07)
Basophils Absolute: 0 10*3/uL (ref 0.0–0.1)
Basophils Relative: 0 %
Eosinophils Absolute: 0.1 10*3/uL (ref 0.0–0.5)
Eosinophils Relative: 1 %
HCT: 38.4 % — ABNORMAL LOW (ref 39.0–52.0)
Hemoglobin: 13.4 g/dL (ref 13.0–17.0)
Immature Granulocytes: 0 %
Lymphocytes Relative: 20 %
Lymphs Abs: 2.5 10*3/uL (ref 0.7–4.0)
MCH: 31.6 pg (ref 26.0–34.0)
MCHC: 34.9 g/dL (ref 30.0–36.0)
MCV: 90.6 fL (ref 80.0–100.0)
Monocytes Absolute: 1.1 10*3/uL — ABNORMAL HIGH (ref 0.1–1.0)
Monocytes Relative: 9 %
Neutro Abs: 8.7 10*3/uL — ABNORMAL HIGH (ref 1.7–7.7)
Neutrophils Relative %: 70 %
Platelets: 265 10*3/uL (ref 150–400)
RBC: 4.24 MIL/uL (ref 4.22–5.81)
RDW: 11.9 % (ref 11.5–15.5)
WBC: 12.6 10*3/uL — ABNORMAL HIGH (ref 4.0–10.5)
nRBC: 0 % (ref 0.0–0.2)

## 2022-03-17 LAB — TROPONIN I (HIGH SENSITIVITY)
Troponin I (High Sensitivity): 32 ng/L — ABNORMAL HIGH (ref <18)
Troponin I (High Sensitivity): 31 ng/L — ABNORMAL HIGH (ref <18)

## 2022-03-17 LAB — BASIC METABOLIC PANEL
Anion gap: 12 (ref 5–15)
BUN: 25 mg/dL — ABNORMAL HIGH (ref 6–20)
CO2: 29 mmol/L (ref 22–32)
Calcium: 8.7 mg/dL — ABNORMAL LOW (ref 8.9–10.3)
Chloride: 95 mmol/L — ABNORMAL LOW (ref 98–111)
Creatinine, Ser: 1.47 mg/dL — ABNORMAL HIGH (ref 0.61–1.24)
GFR, Estimated: 58 mL/min — ABNORMAL LOW (ref >60)
Glucose, Bld: 324 mg/dL — ABNORMAL HIGH (ref 70–99)
Potassium: 3.2 mmol/L — ABNORMAL LOW (ref 3.5–5.1)
Sodium: 136 mmol/L (ref 135–145)

## 2022-03-17 LAB — ECHOCARDIOGRAM COMPLETE
AR max vel: 2.96 cm2
AV Peak grad: 3.6 mmHg
Ao pk vel: 0.95 m/s
Area-P 1/2: 3.53 cm2
Calc EF: 58 %
Height: 68 in
S' Lateral: 3 cm
Single Plane A2C EF: 57 %
Single Plane A4C EF: 58.5 %
Weight: 3456 oz

## 2022-03-17 LAB — MRSA NEXT GEN BY PCR, NASAL: MRSA by PCR Next Gen: NOT DETECTED

## 2022-03-17 LAB — GLUCOSE, CAPILLARY: Glucose-Capillary: 270 mg/dL — ABNORMAL HIGH (ref 70–99)

## 2022-03-17 LAB — CBG MONITORING, ED: Glucose-Capillary: 286 mg/dL — ABNORMAL HIGH (ref 70–99)

## 2022-03-17 LAB — D-DIMER, QUANTITATIVE: D-Dimer, Quant: 0.4 ug/mL-FEU (ref 0.00–0.50)

## 2022-03-17 MED ORDER — AMOXICILLIN-POT CLAVULANATE 875-125 MG PO TABS
1.0000 | ORAL_TABLET | Freq: Two times a day (BID) | ORAL | Status: DC
  Administered 2022-03-17 – 2022-03-18 (×3): 1 via ORAL
  Filled 2022-03-17 (×3): qty 1

## 2022-03-17 MED ORDER — FUROSEMIDE 10 MG/ML IJ SOLN
40.0000 mg | Freq: Once | INTRAMUSCULAR | Status: AC
Start: 2022-03-17 — End: 2022-03-17
  Administered 2022-03-17: 40 mg via INTRAVENOUS
  Filled 2022-03-17: qty 4

## 2022-03-17 MED ORDER — CEFTRIAXONE SODIUM 1 G IJ SOLR
1.0000 g | Freq: Once | INTRAMUSCULAR | Status: AC
  Administered 2022-03-17: 1 g via INTRAVENOUS
  Filled 2022-03-17: qty 10

## 2022-03-17 MED ORDER — METFORMIN HCL ER 750 MG PO TB24: 2000.0000 mg | ORAL_TABLET | Freq: Every day | ORAL | Status: DC

## 2022-03-17 MED ORDER — ORAL CARE MOUTH RINSE: 15.0000 mL | OROMUCOSAL | Status: DC | PRN

## 2022-03-17 MED ORDER — SERTRALINE HCL 25 MG PO TABS
25.0000 mg | ORAL_TABLET | Freq: Every day | ORAL | Status: DC
  Administered 2022-03-17 – 2022-03-24 (×8): 25 mg via ORAL
  Filled 2022-03-17 (×8): qty 1

## 2022-03-17 MED ORDER — ACETAMINOPHEN 325 MG PO TABS: 650.0000 mg | ORAL_TABLET | ORAL | Status: DC | PRN

## 2022-03-17 MED ORDER — ASPIRIN 81 MG PO CHEW
324.0000 mg | CHEWABLE_TABLET | Freq: Once | ORAL | Status: AC
  Administered 2022-03-17: 324 mg via ORAL
  Filled 2022-03-17: qty 4

## 2022-03-17 MED ORDER — ENOXAPARIN SODIUM 40 MG/0.4ML IJ SOSY
40.0000 mg | PREFILLED_SYRINGE | INTRAMUSCULAR | Status: DC
Start: 2022-03-17 — End: 2022-03-18
  Administered 2022-03-17: 40 mg via SUBCUTANEOUS
  Filled 2022-03-17: qty 0.4

## 2022-03-17 MED ORDER — SIMVASTATIN 20 MG PO TABS
20.0000 mg | ORAL_TABLET | Freq: Every day | ORAL | Status: DC
  Administered 2022-03-17: 20 mg via ORAL
  Filled 2022-03-17: qty 1

## 2022-03-17 MED ORDER — EMTRICITABINE-TENOFOVIR AF 200-25 MG PO TABS
1.0000 | ORAL_TABLET | Freq: Every day | ORAL | Status: DC
  Administered 2022-03-17 – 2022-03-24 (×8): 1 via ORAL
  Filled 2022-03-17 (×8): qty 1

## 2022-03-17 MED ORDER — METOPROLOL TARTRATE 50 MG PO TABS
100.0000 mg | ORAL_TABLET | Freq: Once | ORAL | Status: AC
  Administered 2022-03-18: 100 mg via ORAL
  Filled 2022-03-17: qty 2

## 2022-03-17 MED ORDER — INSULIN ASPART 100 UNIT/ML IJ SOLN
0.0000 [IU] | Freq: Three times a day (TID) | INTRAMUSCULAR | Status: DC
  Administered 2022-03-17: 11 [IU] via SUBCUTANEOUS
  Administered 2022-03-18: 7 [IU] via SUBCUTANEOUS
  Administered 2022-03-18: 15 [IU] via SUBCUTANEOUS
  Administered 2022-03-18: 11 [IU] via SUBCUTANEOUS
  Administered 2022-03-19 (×2): 7 [IU] via SUBCUTANEOUS
  Administered 2022-03-19: 11 [IU] via SUBCUTANEOUS
  Administered 2022-03-20: 7 [IU] via SUBCUTANEOUS
  Administered 2022-03-20: 4 [IU] via SUBCUTANEOUS
  Administered 2022-03-20: 7 [IU] via SUBCUTANEOUS
  Administered 2022-03-21: 3 [IU] via SUBCUTANEOUS
  Administered 2022-03-21 (×2): 7 [IU] via SUBCUTANEOUS
  Administered 2022-03-22: 8 [IU] via SUBCUTANEOUS
  Administered 2022-03-22: 7 [IU] via SUBCUTANEOUS
  Administered 2022-03-22: 11 [IU] via SUBCUTANEOUS
  Administered 2022-03-23: 7 [IU] via SUBCUTANEOUS
  Administered 2022-03-23: 15 [IU] via SUBCUTANEOUS
  Administered 2022-03-23: 4 [IU] via SUBCUTANEOUS
  Administered 2022-03-24: 7 [IU] via SUBCUTANEOUS
  Administered 2022-03-24: 11 [IU] via SUBCUTANEOUS

## 2022-03-17 MED ORDER — INSULIN GLARGINE-YFGN 100 UNIT/ML ~~LOC~~ SOPN
10.0000 [IU] | PEN_INJECTOR | Freq: Every day | SUBCUTANEOUS | Status: DC
Start: 2022-03-17 — End: 2022-03-18
  Filled 2022-03-17: qty 3

## 2022-03-17 MED ORDER — FUROSEMIDE 10 MG/ML IJ SOLN
40.0000 mg | Freq: Two times a day (BID) | INTRAMUSCULAR | Status: DC
  Administered 2022-03-17 – 2022-03-19 (×4): 40 mg via INTRAVENOUS
  Filled 2022-03-17 (×4): qty 4

## 2022-03-17 MED ORDER — AMLODIPINE BESYLATE 5 MG PO TABS
5.0000 mg | ORAL_TABLET | Freq: Every day | ORAL | Status: DC
Start: 2022-03-17 — End: 2022-03-20
  Administered 2022-03-17 – 2022-03-19 (×2): 5 mg via ORAL
  Filled 2022-03-17 (×3): qty 1

## 2022-03-17 MED ORDER — POTASSIUM CHLORIDE CRYS ER 20 MEQ PO TBCR
40.0000 meq | EXTENDED_RELEASE_TABLET | Freq: Once | ORAL | Status: AC
  Administered 2022-03-17: 40 meq via ORAL
  Filled 2022-03-17: qty 2

## 2022-03-17 MED ORDER — ONDANSETRON HCL 4 MG/2ML IJ SOLN
4.0000 mg | Freq: Three times a day (TID) | INTRAMUSCULAR | Status: DC | PRN
  Administered 2022-03-17 – 2022-03-18 (×2): 4 mg via INTRAVENOUS
  Filled 2022-03-17 (×2): qty 2

## 2022-03-17 MED ORDER — BUSPIRONE HCL 15 MG PO TABS
7.5000 mg | ORAL_TABLET | Freq: Two times a day (BID) | ORAL | Status: DC
  Administered 2022-03-17 – 2022-03-24 (×14): 7.5 mg via ORAL
  Filled 2022-03-17 (×14): qty 1

## 2022-03-17 MED ORDER — EMPAGLIFLOZIN 10 MG PO TABS
10.0000 mg | ORAL_TABLET | Freq: Every morning | ORAL | Status: DC
  Administered 2022-03-17 – 2022-03-19 (×3): 10 mg via ORAL
  Filled 2022-03-17 (×3): qty 1

## 2022-03-17 NOTE — Progress Notes (Signed)
Upon admission assessment pt stated having suicidal thoughts but no active plan. RN paged Internal MD and MD assessed pt and stated no suicidal precautions need to be implemented due to past suicidal ideation.

## 2022-03-17 NOTE — Consult Note (Addendum)
Cardiology Consultation:   Patient ID: Leroy Simmons MRN: [id number]; DOB: 1971/02/24  Admit date: 03/16/2022 Date of Consult: 03/17/2022  PCP:  [place], Buren Kos, MD   [facility] Providers Cardiologist:  None       Patient Profile:   Leroy Simmons is a 51 y.o. male with a hx of HTN, DM, HLD, ? CHF  in past and anxiety who is being seen 03/17/2022 for the evaluation of chest pain and CHF at the request of Dr. Manus Gunning.  History of Present Illness:   Mr. Fultz with above hx presents 03/16/22 with chest tightness and SOB,  he has had stress test in 2021 with normal LV perfusion rest and stress.  EF 57%.  No mention of CAD on CTA of chest 5/22 and neg PE at that time. He did have CHF  with normal EF.    Echo in the past noted on OV note as normal.    Pt is on lasix and has missed a dose or two.  He started with leg swelling yesterday that increased.  Now with chest tightness ans SOB worse with exertion.   No cough or fever.  He does eat salty foods.  Chest pan currently comes and goes.   EKG:  The EKG was personally reviewed and demonstrates:  SR at 79 and non specific ST changes no acute changes from 2010  Telemetry:  Telemetry was personally reviewed and demonstrates:  SR Hs troponin  30;31;32 Ddimer 0.40 BnP 57  A1C 12/07/21 10.7  Na 136 K+ 3.3 glucose 366 BUN 25 Cr 1.54  LFTs normal   WBC 19 yesterday and today 12.6 with Hgb 13.4  Plts 265   Past Medical History:  Diagnosis Date   Anxiety    CHF (congestive heart failure) (HCC)    Depression    Diabetes mellitus without complication (HCC)    Hypertension     History reviewed. No pertinent surgical history.   Home Medications:  Prior to Admission medications   Medication Sig Start Date End Date Taking? Authorizing Provider  amLODipine (NORVASC) 5 MG tablet Take 1 tablet (5 mg total) by mouth daily. 03/10/22  Yes de [place], Raymond J, MD  busPIRone (BUSPAR) 5 MG tablet Take 1.5 tablets (7.5 mg total) by mouth 2 (two)  times daily. 03/10/22  Yes [place], Raymond J, MD  Emtricitabine-Tenofovir AF (DESCOVY PO) Take 1 tablet by mouth daily.   Yes [provider]  furosemide (LASIX) 40 MG tablet Take 40 mg by mouth daily. 03/03/21  Yes [provider]  ibuprofen (ADVIL) 200 MG tablet Take 200-400 mg by mouth daily as needed.   Yes [provider]  insulin glargine-yfgn (SEMGLEE) 100 UNIT/ML Pen Inject 10 Units into the skin daily. 01/18/22  Yes [place], Raymond J, MD  JARDIANCE 10 MG TABS tablet Take 10 mg by mouth every morning. 03/03/21  Yes [provider]  lisinopril (ZESTRIL) 20 MG tablet Take 1 tablet by mouth daily. 05/24/19  Yes [provider]  metFORMIN (GLUCOPHAGE-XR) 500 MG 24 hr tablet Take 2,000 mg by mouth at bedtime. 02/17/21  Yes [provider]  sertraline (ZOLOFT) 25 MG tablet Take 1 tablet (25 mg total) by mouth daily. 01/18/22  Yes [place], Raymond J, MD  simvastatin (ZOCOR) 20 MG tablet Take 20 mg by mouth at bedtime. 11/25/21  Yes [provider]  Insulin Pen Needle (PEN NEEDLES 3/16") 31G X 5 MM MISC 1 each by Does not apply  route daily. 01/18/22   [place], Blondell Reveal, MD  meloxicam (MOBIC) 15 MG tablet Take 1 tablet (15 mg total) by mouth daily as needed for pain. Patient not taking: Reported on 03/17/2022 10/22/21   Dickie La, MD    Inpatient Medications: Scheduled Meds:  Continuous Infusions:  PRN Meds:   Allergies:   No Known Allergies  Social History:   Social History   Socioeconomic History   Marital status: Single    Spouse name: Not on file   Number of children: Not on file   Years of education: Not on file   Highest education level: Not on file  Occupational History   Not on file  Tobacco Use   Smoking status: Never   Smokeless tobacco: Never  Vaping Use   Vaping Use: Never used  Substance and Sexual Activity   Alcohol use: Yes   Drug use: Never   Sexual activity: Not on file  Other Topics Concern   Not on  file  Social History Narrative   Not on file   Social Determinants of Health   Financial Resource Strain: Not on file  Food Insecurity: Not on file  Transportation Needs: Not on file  Physical Activity: Not on file  Stress: Not on file  Social Connections: Not on file  Intimate Partner Violence: Not on file    Family History:    Family History  Problem Relation Age of Onset   Heart failure Mother      ROS:  Please see the history of present illness.  General:no colds or fevers, no weight changes Skin:no rashes or ulcers HEENT:no blurred vision, no congestion CV:see HPI PUL:see HPI GI:no diarrhea constipation or melena, no indigestion GU:no hematuria, no dysuria MS:no joint pain, no claudication, red lower ext and edema Neuro:no syncope, no lightheadedness Endo:+ diabetes, no thyroid disease  All other ROS reviewed and negative.     Physical Exam/Data:   Vitals:   03/17/22 1145 03/17/22 1200 03/17/22 1215 03/17/22 1230  BP: 131/72 (!) 176/117 (!) 89/54 124/75  Pulse: 84 88 81 78  Resp: 15 (!) 22 15 16   Temp:      TempSrc:      SpO2: 97% 94% 94% 97%  Weight:      Height:        Intake/Output Summary (Last 24 hours) at 03/17/2022 1300 Last data filed at 03/17/2022 1138 Gross per 24 hour  Intake 100 ml  Output 700 ml  Net -600 ml      03/16/2022    7:37 PM 03/10/2022    9:23 AM 02/01/2022   10:01 AM  Last 3 Weights  Weight (lbs) 216 lb 215 lb 12.8 oz 211 lb  Weight (kg) 97.977 kg 97.886 kg 95.709 kg     Body mass index is 32.84 kg/m.  General:  Well nourished, well developed, in no acute distress though chest pain comes and goes and still with lower ext edema HEENT: normal Neck: no JVD Vascular: No carotid bruits; Distal pulses 2+ bilaterally Cardiac:  normal S1, S2; RRR; no murmur gallup rub or click Lungs:  rales in bases to auscultation bilaterally, no wheezing, rhonchi  Abd: soft, nontender, no hepatomegaly  Ext: 2-3+ lower ext edema with redness in  both though R>L Musculoskeletal:  No deformities, BUE and BLE strength normal and equal Skin: warm and dry  Neuro: alert and oriented X 3 MAE follows commands no focal abnormalities noted Psych:  Normal affect    Relevant CV  Studies: none  Laboratory Data:  High Sensitivity Troponin:   Recent Labs  Lab 03/16/22 1945 03/16/22 2243 03/17/22 0932  TROPONINIHS 30* 31* 32*     Chemistry Recent Labs  Lab 03/16/22 1945  NA 136  K 3.3*  CL 97*  CO2 25  GLUCOSE 366*  BUN 25*  CREATININE 1.54*  CALCIUM 9.3  GFRNONAA 55*  ANIONGAP 14    Recent Labs  Lab 03/16/22 1945  PROT 7.4  ALBUMIN 3.5  AST 15  ALT 19  ALKPHOS 103  BILITOT 1.2   Lipids No results for input(s): "CHOL", "TRIG", "HDL", "LABVLDL", "LDLCALC", "CHOLHDL" in the last 168 hours.  Hematology Recent Labs  Lab 03/16/22 1945 03/17/22 0932  WBC 19.0* 12.6*  RBC 4.43 4.24  HGB 13.7 13.4  HCT 40.0 38.4*  MCV 90.3 90.6  MCH 30.9 31.6  MCHC 34.3 34.9  RDW 11.4* 11.9  PLT 335 265   Thyroid No results for input(s): "TSH", "FREET4" in the last 168 hours.  BNP Recent Labs  Lab 03/16/22 1945  BNP 57.1    DDimer  Recent Labs  Lab 03/17/22 0932  DDIMER 0.40     Radiology/Studies:  DG Chest 2 View  Result Date: 03/16/2022 CLINICAL DATA:  Chest pain. EXAM: CHEST - 2 VIEW COMPARISON:  Chest radiograph dated 08/02/2009. FINDINGS: Mild chronic bronchitic changes. No focal consolidation, pleural effusion, pneumothorax. The cardiac silhouette is within normal limits. No acute osseous pathology. IMPRESSION: No active cardiopulmonary disease. Electronically Signed   By: Elgie Collard M.D.   On: 03/16/2022 20:35     Assessment and Plan:   Chest pain with hx neg nuc in 2021 trop flat 30-32 no acute EKG changes has rec'd ASA Diastolic CHF has rec'd 40 mg IV lasix neg 1,325  Cellulitis has had rocephin and improved WBC will check venous dopplers as well DM-2 poorly controlled per IM HTN 140/82 to 124/75      Risk Assessment/Risk Scores:     HEAR Score (for undifferentiated chest pain):     New York Heart Association (NYHA) Functional Class  NYHA Class III       For questions or updates, please contact [facility] Please consult [email or web address] for contact info under    Signed, Nada Boozer, NP  03/17/2022 1:00 PM  Patient seen and examined with Nada Boozer NP.  Agree as above, with the following exceptions and changes as noted below.  51 year old male with prior history of heart failure with preserved ejection fraction currently evaluated in the emergency department for decompensation of heart failure with shortness of breath chest discomfort and lower extremity edema.  Also has cellulitis of the left lower extremity receiving antibiotics.  Echocardiogram performed, ejection fraction is preserved.  Moderate LVH, diastolic function felt to be indeterminate but E over E prime is 17 suggesting elevated LV filling pressure.  Gen: NAD, CV: RRR, no murmurs, Lungs: clear, Abd: soft, Extrem: Warm, well perfused, 2+ edema, Neuro/Psych: alert and oriented x 3, normal mood and affect. All available labs, radiology testing, previous records reviewed.  There are outside reports of a normal stress test in 2021 which we cannot independently review images from.  In addition patient has a borderline elevation of his troponin.  While his findings are most likely due to missing doses of his Lasix, he likely requires 1-2 nights in hospital for optimization of volume status and recommend IV diuresis with Lasix 40 mg IV twice daily for at least 24 hours and reassess.  He has  had good output with just 1 dose of 40 mg of IV Lasix thus far.  He is concerned about waxing and waning chest discomfort.  Again with a normal stress test by report from 2021.  Definitive testing with coronary CT angiography is recommended and we will perform this tomorrow.  If no obstructive CAD, patient can be optimized from a volume  standpoint and likely dismissed back to his primary care physician.  He has not answered the phone for multiple attempts at a cardiology referral per his primary care doctors notes, which seems to suggest inpatient testing would be recommended since we cannot be certain he will follow-up as an outpatient for an ischemic evaluation.  Risk factors for ischemia include poorly controlled diabetes, hypertension, hyperlipidemia.  Discussed with internal medicine who will accept him for admission, cardiology will follow in consult.  Elouise Munroe, MD 03/17/22 3:13 PM

## 2022-03-17 NOTE — Hospital Course (Addendum)
mild cellulitis Mild CHFE  Chest pain improving. Feels better. Reports dry weight ~210 lb.  Eating drinking well. No problems with bowel movement.  +JVD Right leg red and swelling.

## 2022-03-17 NOTE — Plan of Care (Signed)
  Problem: Activity: Goal: Ability to tolerate increased activity will improve Outcome: Progressing   Problem: Fluid Volume: Goal: Ability to maintain a balanced intake and output will improve Outcome: Progressing   Problem: Nutritional: Goal: Maintenance of adequate nutrition will improve Outcome: Progressing   Problem: Skin Integrity: Goal: Risk for impaired skin integrity will decrease Outcome: Progressing   Problem: Activity: Goal: Risk for activity intolerance will decrease Outcome: Progressing   Problem: Nutrition: Goal: Adequate nutrition will be maintained Outcome: Progressing   Problem: Coping: Goal: Level of anxiety will decrease Outcome: Progressing   Problem: Elimination: Goal: Will not experience complications related to bowel motility Outcome: Progressing Goal: Will not experience complications related to urinary retention Outcome: Progressing

## 2022-03-17 NOTE — ED Provider Notes (Signed)
[facility] EMERGENCY DEPARTMENT Provider Note   CSN: [id number] Arrival date & time: 03/16/22  1854     History  Chief Complaint  Patient presents with   Chest Pain    Leroy Simmons is a 51 y.o. male.  Patient with a history of diabetes, hypertension, high cholesterol, questionable CHF here with a 2-day history of chest tightness, shortness of breath, lightheadedness, leg swelling.  States all symptoms started yesterday morning other than leg swelling which has been progressively worsening for the past couple days.  He does take Lasix but does not think he is ever been diagnosed with CHF officially.  Does admit to some missed doses.  Has had tightness in the center of his chest since yesterday associate with shortness of breath worse with exertion.  There is associated lightheadedness and nausea but no vomiting.  The chest pain does not radiate.  Is associate with some shortness of breath which is worse with exertion.  No cough or fever.  No abdominal pain.  Denies any cardiac history reports no stents in his heart.Pain is exertional, not pleuritic.  The history is provided by the patient.  Chest Pain Associated symptoms: dizziness, fatigue, palpitations, shortness of breath and weakness   Associated symptoms: no abdominal pain, no diaphoresis, no headache, no nausea and no vomiting        Home Medications Prior to Admission medications   Medication Sig Start Date End Date Taking? Authorizing Provider  amLODipine (NORVASC) 5 MG tablet Take 1 tablet (5 mg total) by mouth daily. 03/10/22   [place], Buren Kos, MD  busPIRone (BUSPAR) 5 MG tablet Take 1.5 tablets (7.5 mg total) by mouth 2 (two) times daily. 03/10/22   [place], Raymond J, MD  furosemide (LASIX) 40 MG tablet Take 40 mg by mouth daily. 03/03/21   [provider]  hydrOXYzine (ATARAX/VISTARIL) 25 MG tablet Take by mouth. 10/21/20   [provider]  ibuprofen (ADVIL) 800 MG tablet Take 800 mg by  mouth every 8 (eight) hours as needed.    [provider]  insulin glargine-yfgn (SEMGLEE) 100 UNIT/ML Pen Inject 10 Units into the skin daily. 01/18/22   [place], Raymond J, MD  Insulin Pen Needle (PEN NEEDLES 3/16") 31G X 5 MM MISC 1 each by Does not apply route daily. 01/18/22   [place], Raymond J, MD  JARDIANCE 10 MG TABS tablet Take 10 mg by mouth every morning. 03/03/21   [provider]  lisinopril (ZESTRIL) 20 MG tablet Take 1 tablet by mouth daily. 05/24/19   [provider]  meloxicam (MOBIC) 15 MG tablet Take 1 tablet (15 mg total) by mouth daily as needed for pain. 10/22/21   Nestor Ramp, MD  metFORMIN (GLUCOPHAGE-XR) 500 MG 24 hr tablet Take 2,000 mg by mouth at bedtime. 02/17/21   [provider]  methocarbamol (ROBAXIN) 500 MG tablet Take 1 tablet (500 mg total) by mouth 2 (two) times daily as needed. 07/21/21   Cristie Hem, PA-C  rosuvastatin (CRESTOR) 20 MG tablet Take 20 mg by mouth daily. 02/17/21   [provider]  sertraline (ZOLOFT) 25 MG tablet Take 1 tablet (25 mg total) by mouth daily. 01/18/22   [place], Buren Kos, MD  simvastatin (ZOCOR) 20 MG tablet Take 20 mg by mouth at bedtime. 11/25/21   [provider]      Allergies    Patient has no known allergies.    Review of Systems  Review of Systems  Constitutional:  Positive for fatigue. Negative for activity change, appetite change and diaphoresis.  HENT:  Negative for congestion and rhinorrhea.   Respiratory:  Positive for chest tightness and shortness of breath.   Cardiovascular:  Positive for chest pain, palpitations and leg swelling.  Gastrointestinal:  Negative for abdominal pain, nausea and vomiting.  Genitourinary:  Negative for dysuria.  Musculoskeletal:  Negative for arthralgias and myalgias.  Skin:  Negative for rash.  Neurological:  Positive for dizziness, weakness and light-headedness. Negative for headaches.    Physical Exam Updated Vital Signs BP  131/87   Pulse 78   Temp 98.9 F (37.2 C) (Oral)   Resp 16   Ht 5\' 8"  (1.727 m)   Wt 98 kg   SpO2 97%   BMI 32.84 kg/m  Physical Exam Vitals and nursing note reviewed.  Constitutional:      General: He is not in acute distress.    Appearance: He is well-developed.  HENT:     Head: Normocephalic and atraumatic.     Mouth/Throat:     Pharynx: No oropharyngeal exudate.  Eyes:     Conjunctiva/sclera: Conjunctivae normal.     Pupils: Pupils are equal, round, and reactive to light.  Neck:     Comments: No meningismus. Cardiovascular:     Rate and Rhythm: Normal rate and regular rhythm.     Heart sounds: Normal heart sounds. No murmur heard.    Comments: Diminished at bases Pulmonary:     Effort: Pulmonary effort is normal. No respiratory distress.     Breath sounds: Normal breath sounds.  Chest:     Chest wall: No tenderness.  Abdominal:     Palpations: Abdomen is soft.     Tenderness: There is no abdominal tenderness. There is no guarding or rebound.  Musculoskeletal:        General: No tenderness. Normal range of motion.     Cervical back: Normal range of motion and neck supple.     Right lower leg: Edema present.     Left lower leg: Edema present.     Comments: Scattered petechial rash to lower extremities.  Intact DP and PT pulses  Erythema involving right shin and dorsal foot. Compartments are soft  Skin:    General: Skin is warm.  Neurological:     Mental Status: He is alert and oriented to person, place, and time.     Cranial Nerves: No cranial nerve deficit.     Motor: No abnormal muscle tone.     Coordination: Coordination normal.     Comments:  5/5 strength throughout. CN 2-12 intact.Equal grip strength.   Psychiatric:        Behavior: Behavior normal.     ED Results / Procedures / Treatments   Labs (all labs ordered are listed, but only abnormal results are displayed) Labs Reviewed  CBC WITH DIFFERENTIAL/PLATELET - Abnormal; Notable for the following  components:      Result Value   WBC 19.0 (*)    RDW 11.4 (*)    Neutro Abs 16.1 (*)    Monocytes Absolute 1.1 (*)    Abs Immature Granulocytes 0.09 (*)    All other components within normal limits  COMPREHENSIVE METABOLIC PANEL - Abnormal; Notable for the following components:   Potassium 3.3 (*)    Chloride 97 (*)    Glucose, Bld 366 (*)    BUN 25 (*)    Creatinine, Ser 1.54 (*)    GFR,  Estimated 55 (*)    All other components within normal limits  TROPONIN I (HIGH SENSITIVITY) - Abnormal; Notable for the following components:   Troponin I (High Sensitivity) 30 (*)    All other components within normal limits  TROPONIN I (HIGH SENSITIVITY) - Abnormal; Notable for the following components:   Troponin I (High Sensitivity) 31 (*)    All other components within normal limits  TROPONIN I (HIGH SENSITIVITY) - Abnormal; Notable for the following components:   Troponin I (High Sensitivity) 32 (*)    All other components within normal limits  BRAIN NATRIURETIC PEPTIDE  D-DIMER, QUANTITATIVE  CBC WITH DIFFERENTIAL/PLATELET  BASIC METABOLIC PANEL  TROPONIN I (HIGH SENSITIVITY)    EKG EKG Interpretation  Date/Time:  Wednesday March 16 2022 19:37:00 EDT Ventricular Rate:  103 PR Interval:  148 QRS Duration: 70 QT Interval:  360 QTC Calculation: 471 R Axis:   -2 Text Interpretation: Sinus tachycardia Possible Left atrial enlargement Left ventricular hypertrophy with repolarization abnormality ( R in aVL ) Abnormal ECG When compared with ECG of 07-Dec-2009 19:27, PREVIOUS ECG IS PRESENT Nonspecific T wave abnormality Confirmed by Glynn Octave [phone]) on 03/17/2022 8:07:59 AM  Radiology DG Chest 2 View  Result Date: 03/16/2022 CLINICAL DATA:  Chest pain. EXAM: CHEST - 2 VIEW COMPARISON:  Chest radiograph dated 08/02/2009. FINDINGS: Mild chronic bronchitic changes. No focal consolidation, pleural effusion, pneumothorax. The cardiac silhouette is within normal limits. No acute osseous  pathology. IMPRESSION: No active cardiopulmonary disease. Electronically Signed   By: Elgie Collard M.D.   On: 03/16/2022 20:35    Procedures Procedures    Medications Ordered in ED Medications  furosemide (LASIX) injection 40 mg (has no administration in time range)  aspirin chewable tablet 324 mg (324 mg Oral Given 03/17/22 0919)    ED Course/ Medical Decision Making/ A&P                           Medical Decision Making Amount and/or Complexity of Data Reviewed Labs: ordered. Decision-making details documented in ED Course. Radiology: ordered and independent interpretation performed. Decision-making details documented in ED Course. ECG/medicine tests: ordered and independent interpretation performed. Decision-making details documented in ED Course.  Risk OTC drugs. Prescription drug management. Decision regarding hospitalization.  2 days of chest tightness, shortness of breath, lightheadedness that is exertional.  EKG shows LVH with nonspecific T wave changes.  Patient in no respiratory distress.  Lungs are clear but diminished at the bases.  Does have leg swelling.  Patient given aspirin as well as IV Lasix  Troponin minimally elevated and flat.  Chest x-ray shows no significant edema.  Results reviewed and interpreted by me.  Repeat EKG shows ST depression laterally. No acute ST elevation.  Troponin remains flat with low suspicion for ACS with patient is symptomatic with exertional chest pain or shortness of breath.  Discussed with cardiology who will evaluate.  No echocardiogram in the system.  Patient does have leukocytosis of 19.  May have early cellulitis of his right leg. Hyperglycemia without evidence of DKA Creatinine slightly elevated compared to baseline. D-dimer negative.  Low suspicion for pulmonary embolism or aortic dissection.  No hypoxia or increased work of breathing.  Patient is does have a concerning story for chest pain that is exertional.  He does  have subtle EKG changes but flat troponins.  Cardiology will evaluate. Suspect likely new onset diastolic CHF exacerbation. We will treat cellulitis with antibiotics.  Dr.  Smith of hospitalist service defers admission to unassigned schedule.  Admission discussed with internal medicine residents       Final Clinical Impression(s) / ED Diagnoses Final diagnoses:  Acute on chronic congestive heart failure, unspecified heart failure type (HCC)  Cellulitis of right lower extremity    Rx / DC Orders ED Discharge Orders     None         Jerard Bays, Jeannett Senior, MD 03/17/22 1120

## 2022-03-17 NOTE — ED Notes (Signed)
Pt c/o of dizziness, and shortness of breath

## 2022-03-17 NOTE — ED Notes (Signed)
Pt was able to ambulate with SPO2 remaining at 98%. Pt complained of feeling lightheaded and legs very uncomfortable

## 2022-03-17 NOTE — H&P (Signed)
NAME:  Leroy Simmons, MRN:  [id number], DOB:  January 13, 1971, LOS: 0 ADMISSION DATE:  03/16/2022, Primary: [place], Blondell Reveal, MD  CHIEF COMPLAINT: Shortness of breath, leg swelling  Medical Service: Internal Medicine Teaching Service         Attending Physician: Dr. Velna Ochs, MD    First Contact: Dr. Jeanice Lim Pager: [phone]  Second Contact: Dr. Alfonse Spruce Pager: [phone]       After Hours (After 5p/  First Contact Pager: [phone]  weekends / holidays): Second Contact Pager: [phone]    History of present illness   Leroy Simmons is a 51 year old male with medical problems noted below who presents with 32-monthhistory of progressive exertional dyspnea, 1 week history of lower extremity swelling, and roughly 2-day history of chest tightness.  Shortness of breath and chest tightness primarily occur with exertion however shortness of breath has started to bother him even at rest.  Chest pain is nonradiating and described as pressure.  Typically has NYHA class I symptoms however, they have slowly progressed to roughly class III symptoms in the past week.  Endorses orthopnea and abdominal distention over the same period of time.  He admits that he has experienced intermittent chest pain in the past.  He underwent stress testing in 2021 which did not reveal exertion related ischemia.  He admits that he has been under more stress lately.  He admits to incomplete adherence to Lasix, misses 2 or 3 doses a week.  Admits nonadherence to diabetic medications.  Due to his lower extremity swelling over the past week, he did try to wear compression socks but ended up not taking them off for a couple of days.  After he did finally take them off, he noticed some skin abrasions on his right lower extremity.  Over the past couple days he has developed some redness and tenderness over the sites.  Denies drainage.  He does admit to history of cellulitis and abscesses in the past.  Endorses neuropathic pain in his  bilateral feet.  Denies numbness  Denies fever, chills, nausea, vomiting, productive cough, pleuritic chest pain, change in urination amount or frequency  Past Medical History  He,  has a past medical history of Anxiety, [facility] [place], Depression, Diabetes mellitus without complication [place], and Hypertension.   Home Medications     Prior to Admission medications   Medication Sig Start Date End Date Taking? Authorizing Provider  amLODipine (NORVASC) 5 MG tablet Take 1 tablet (5 mg total) by mouth daily. 03/10/22  Yes [place] Raymond J, MD  busPIRone (BUSPAR) 5 MG tablet Take 1.5 tablets (7.5 mg total) by mouth 2 (two) times daily. 03/10/22  Yes [place] Raymond J, MD  Emtricitabine-Tenofovir AF (DESCOVY PO) Take 1 tablet by mouth daily.   Yes [provider]  furosemide (LASIX) 40 MG tablet Take 40 mg by mouth daily. 03/03/21  Yes [provider]  ibuprofen (ADVIL) 200 MG tablet Take 200-400 mg by mouth daily as needed.   Yes [provider]  insulin glargine-yfgn (SEMGLEE) 100 UNIT/ML Pen Inject 10 Units into the skin daily. 01/18/22  Yes [place] Raymond J, MD  JARDIANCE 10 MG TABS tablet Take 10 mg by mouth every morning. 03/03/21  Yes [provider]  lisinopril (ZESTRIL) 20 MG tablet Take 1 tablet by mouth daily. 05/24/19  Yes [provider]  metFORMIN (GLUCOPHAGE-XR) 500 MG 24 hr tablet Take 2,000 mg by mouth at bedtime. 02/17/21  Yes  [provider]  sertraline (ZOLOFT) 25 MG tablet Take 1 tablet (25 mg total) by mouth daily. 01/18/22  Yes [place], Raymond J, MD  simvastatin (ZOCOR) 20 MG tablet Take 20 mg by mouth at bedtime. 11/25/21  Yes [provider]  Insulin Pen Needle (PEN NEEDLES 3/16") 31G X 5 MM MISC 1 each by Does not apply route daily. 01/18/22   [place], Raymond J, MD    Allergies    Allergies as of 03/16/2022   (No Known Allergies)    Social History   reports that he has never smoked. He  has never used smokeless tobacco. He reports current alcohol use. He reports that he does not use drugs.   Family History   His family history includes Heart failure in his mother.   Objective   Blood pressure (!) 139/94, pulse 77, temperature 98.9 F (37.2 C), temperature source Oral, resp. rate 20, height 5' 8"  (1.727 m), weight 98 kg, SpO2 99 %.    General: Chronically ill-appearing male in no acute distress HEENT: Moist mucous membranes Cardiac: Heart regular rate and rhythm, +2 lower extremity edema equal bilaterally.  No JVD Pulm: Breathing comfortably on room air.  Bibasilar crackles GI: Abdomen soft, nontender Skin: Superficial abrasions with surrounding erythema involving the anterior aspect of right lower leg and foot.  Increased warmth.  No drainage.  Tender to light touch.  Toenails are long. Significant Diagnostic Tests:       Latest Ref Rng & Units 03/17/2022    9:32 AM 03/16/2022    7:45 PM 06/26/2021    5:31 PM  CBC  WBC 4.0 - 10.5 K/uL 12.6  19.0  7.5   Hemoglobin 13.0 - 17.0 g/dL 13.4  13.7  14.6   Hematocrit 39.0 - 52.0 % 38.4  40.0  42.2   Platelets 150 - 400 K/uL 265  335  242       Latest Ref Rng & Units 03/17/2022   12:49 PM 03/16/2022    7:45 PM 06/26/2021    5:31 PM  BMP  Glucose 70 - 99 mg/dL 324  366  505   BUN 6 - 20 mg/dL 25  25  27    Creatinine 0.61 - 1.24 mg/dL 1.47  1.54  1.06   Sodium 135 - 145 mmol/L 136  136  135   Potassium 3.5 - 5.1 mmol/L 3.2  3.3  4.3   Chloride 98 - 111 mmol/L 95  97  100   CO2 22 - 32 mmol/L 29  25  25    Calcium 8.9 - 10.3 mg/dL 8.7  9.3  10.1       Latest Ref Rng & Units 03/16/2022    7:45 PM 10/26/2010   11:43 AM 09/21/2009   10:46 PM  Hepatic Function  Total Protein 6.5 - 8.1 g/dL 7.4  6.8  7.4   Albumin 3.5 - 5.0 g/dL 3.5  3.6  4.7   AST 15 - 41 U/L 15  18  16    ALT 0 - 44 U/L 19  23  24    Alk Phosphatase 38 - 126 U/L 103  72  100   Total Bilirubin 0.3 - 1.2 mg/dL 1.2  0.9  0.6     Echo: LVEF 55 to 60%.  No  RWMA.  No LVH.  Indeterminate diastolic parameters.  Inadequate tricuspid regurg signal processing PA pressure.  Normal biatrial sizes.  Mild aortic valve regurgitation, no aortic stenosis.  Other valves unremarkable.  Chest  x-ray: Mild chronic bronchitic changes.  No focal consolidation.  Summary  51 year old male with HFpEF, hypertension, and uncontrolled diabetes admitted for observation of chest pain and mild CHF exacerbation.  Assessment & Plan:   Chest pain rule out Troponin leak CHF exacerbation  Assessment: Troponins 30> 31> 32.  BNP 57.  No significant EKG changes since prior ACS risk factors include hypertension, hyperlipidemia, uncontrolled diabetes, obesity Low suspicion for ACS.  Per chart review, patient has had a long history of chest pain and underwent myocardial perfusion scan in 2021 which was unremarkable.  Suspect that his troponin leak, chest discomfort, and exertional dyspnea are attributable to mild CHF exacerbation in setting of medication nonadherence.  He admits to missing a few doses of his daily Lasix.  Overall, patient was not even aware of his HFpEF diagnosis so will likely benefit from ongoing education. Echocardiogram obtained in the ED shows stable findings from prior.  EF 55 to 60%.  Indeterminate diastolic parameters.  No significant valvular disease, mild AVR.  Plan - 40 mg Lasix twice daily - Mild hypokalemia 3.3.  Repleted. - Cardiology consulted in the ED.  Plans to obtain lower extremity duplexes for DVT rule out as well as CT coronary.  Suspect he will be stable for discharge tomorrow if no significant obstruction is appreciable on CT - He is on a reasonable outpatient GDMT which includes Jardiance, lisinopril, and statin.  We will hold the Jardiance and lisinopril while undergoing IV diuresis but could resume at discharge. - Continuous telemetry monitoring  Mild cellulitis of the right lower extremity White count initially elevated to 19, down to  13 today.  No other systemic signs. - Complete 7-day course of Augmentin  Uncontrolled type 2 diabetes due to medication nonadherence - We will hold Jardiance and metformin while undergoing diuresis - We will continue his basal dose of 10 units of Semglee, resistant sliding scale - Check A1c  AKI versus CKD If acute, I would suspect cardiorenal.  Labs in care everywhere from almost exactly 1 year ago did not reveal renal dysfunction however, given his uncontrolled diabetes, it is possible that it has progressed.  He denies any urinary symptoms and is having good urine output since receiving IV Lasix in the ED today - Renal ultrasound - Check UA to evaluate for proteinuria - Follow renal function   Chronic hypertension - Continue home amlodipine.  Hold lisinopril until IV diuresis is complete  Hyperlipidemia - Check lipid panel - Continue statin  Depression/anxiety - Continue BuSpar and Zoloft  High risk sexual activity - Continue PrEP with [place] - Check HIV Best practice:  CODE STATUS: Full DVT for prophylaxis: Lovenox Dispo: Admit patient to Observation with expected length of stay less than 2 midnights.  Mitzi Hansen, MD Internal Medicine Resident PGY-3 [facility] Pager: [phone] 03/17/2022 4:28 PM

## 2022-03-17 NOTE — ED Notes (Addendum)
This RN called 2C for SBAR handoff information. Secretary states that she still has phone for receiving RN and will relay contact information for this RN upon administration of phone to receiving RN.

## 2022-03-17 NOTE — Progress Notes (Signed)
Paged by RN patient mentioned suicidal thoughts. Presented to bedside, patient sitting up side of bed eating dinner. He states in the past he had suicidal thoughts when he was started on Seroquel but he his no longer have suicidal thoughts or ideation. He also notes Wellbutrin was recently added. No other acute concerns.

## 2022-03-18 ENCOUNTER — Observation Stay (HOSPITAL_COMMUNITY): Payer: 59

## 2022-03-18 ENCOUNTER — Observation Stay (HOSPITAL_COMMUNITY)
Admit: 2022-03-18 | Discharge: 2022-03-18 | Disposition: A | Discharge status: Home / Self Care | Payer: 59 | Attending: Internal Medicine | Admitting: Internal Medicine

## 2022-03-18 ENCOUNTER — Other Ambulatory Visit: Payer: Self-pay | Admitting: Internal Medicine

## 2022-03-18 DIAGNOSIS — E7800 Pure hypercholesterolemia, unspecified: Secondary | ICD-10-CM | POA: Diagnosis present

## 2022-03-18 DIAGNOSIS — I509 Heart failure, unspecified: Secondary | ICD-10-CM

## 2022-03-18 DIAGNOSIS — I2510 Atherosclerotic heart disease of native coronary artery without angina pectoris: Secondary | ICD-10-CM | POA: Diagnosis present

## 2022-03-18 DIAGNOSIS — R609 Edema, unspecified: Secondary | ICD-10-CM | POA: Diagnosis not present

## 2022-03-18 DIAGNOSIS — R931 Abnormal findings on diagnostic imaging of heart and coronary circulation: Secondary | ICD-10-CM

## 2022-03-18 DIAGNOSIS — F419 Anxiety disorder, unspecified: Secondary | ICD-10-CM | POA: Diagnosis present

## 2022-03-18 DIAGNOSIS — R0789 Other chest pain: Secondary | ICD-10-CM | POA: Diagnosis not present

## 2022-03-18 DIAGNOSIS — Z91148 Patient's other noncompliance with medication regimen for other reason: Secondary | ICD-10-CM | POA: Diagnosis not present

## 2022-03-18 DIAGNOSIS — I5033 Acute on chronic diastolic (congestive) heart failure: Secondary | ICD-10-CM | POA: Diagnosis present

## 2022-03-18 DIAGNOSIS — N179 Acute kidney failure, unspecified: Secondary | ICD-10-CM

## 2022-03-18 DIAGNOSIS — E1165 Type 2 diabetes mellitus with hyperglycemia: Secondary | ICD-10-CM

## 2022-03-18 DIAGNOSIS — F32A Depression, unspecified: Secondary | ICD-10-CM | POA: Diagnosis present

## 2022-03-18 DIAGNOSIS — N1831 Chronic kidney disease, stage 3a: Secondary | ICD-10-CM | POA: Diagnosis present

## 2022-03-18 DIAGNOSIS — I6529 Occlusion and stenosis of unspecified carotid artery: Secondary | ICD-10-CM | POA: Diagnosis not present

## 2022-03-18 DIAGNOSIS — L03115 Cellulitis of right lower limb: Secondary | ICD-10-CM

## 2022-03-18 DIAGNOSIS — I5031 Acute diastolic (congestive) heart failure: Secondary | ICD-10-CM | POA: Diagnosis not present

## 2022-03-18 DIAGNOSIS — Z7984 Long term (current) use of oral hypoglycemic drugs: Secondary | ICD-10-CM | POA: Diagnosis not present

## 2022-03-18 DIAGNOSIS — N189 Chronic kidney disease, unspecified: Secondary | ICD-10-CM | POA: Diagnosis present

## 2022-03-18 DIAGNOSIS — I130 Hypertensive heart and chronic kidney disease with heart failure and stage 1 through stage 4 chronic kidney disease, or unspecified chronic kidney disease: Secondary | ICD-10-CM | POA: Diagnosis present

## 2022-03-18 DIAGNOSIS — I110 Hypertensive heart disease with heart failure: Secondary | ICD-10-CM | POA: Diagnosis not present

## 2022-03-18 DIAGNOSIS — L03116 Cellulitis of left lower limb: Secondary | ICD-10-CM | POA: Diagnosis present

## 2022-03-18 DIAGNOSIS — Z7251 High risk heterosexual behavior: Secondary | ICD-10-CM | POA: Diagnosis not present

## 2022-03-18 DIAGNOSIS — Z794 Long term (current) use of insulin: Secondary | ICD-10-CM | POA: Diagnosis not present

## 2022-03-18 DIAGNOSIS — Z23 Encounter for immunization: Secondary | ICD-10-CM | POA: Diagnosis not present

## 2022-03-18 DIAGNOSIS — Z8249 Family history of ischemic heart disease and other diseases of the circulatory system: Secondary | ICD-10-CM | POA: Diagnosis not present

## 2022-03-18 DIAGNOSIS — Z79899 Other long term (current) drug therapy: Secondary | ICD-10-CM | POA: Diagnosis not present

## 2022-03-18 DIAGNOSIS — E1122 Type 2 diabetes mellitus with diabetic chronic kidney disease: Secondary | ICD-10-CM | POA: Diagnosis present

## 2022-03-18 DIAGNOSIS — E876 Hypokalemia: Secondary | ICD-10-CM | POA: Diagnosis present

## 2022-03-18 LAB — GLUCOSE, CAPILLARY
Glucose-Capillary: 246 mg/dL — ABNORMAL HIGH (ref 70–99)
Glucose-Capillary: 334 mg/dL — ABNORMAL HIGH (ref 70–99)
Glucose-Capillary: 282 mg/dL — ABNORMAL HIGH (ref 70–99)
Glucose-Capillary: 353 mg/dL — ABNORMAL HIGH (ref 70–99)

## 2022-03-18 LAB — HIV ANTIBODY (ROUTINE TESTING W REFLEX): HIV Screen 4th Generation wRfx: NONREACTIVE

## 2022-03-18 LAB — BASIC METABOLIC PANEL
Anion gap: 13 (ref 5–15)
BUN: 26 mg/dL — ABNORMAL HIGH (ref 6–20)
CO2: 27 mmol/L (ref 22–32)
Calcium: 8.5 mg/dL — ABNORMAL LOW (ref 8.9–10.3)
Chloride: 99 mmol/L (ref 98–111)
Creatinine, Ser: 1.48 mg/dL — ABNORMAL HIGH (ref 0.61–1.24)
GFR, Estimated: 58 mL/min — ABNORMAL LOW (ref >60)
Glucose, Bld: 235 mg/dL — ABNORMAL HIGH (ref 70–99)
Potassium: 3.5 mmol/L (ref 3.5–5.1)
Sodium: 139 mmol/L (ref 135–145)

## 2022-03-18 LAB — HEMOGLOBIN A1C
Hgb A1c MFr Bld: 9.8 % — ABNORMAL HIGH (ref 4.8–5.6)
Mean Plasma Glucose: 234.56 mg/dL

## 2022-03-18 LAB — LIPID PANEL
Cholesterol: 152 mg/dL (ref 0–200)
HDL: 40 mg/dL — ABNORMAL LOW (ref >40)
LDL Cholesterol: 81 mg/dL (ref 0–99)
Total CHOL/HDL Ratio: 3.8 RATIO
Triglycerides: 153 mg/dL — ABNORMAL HIGH (ref <150)
VLDL: 31 mg/dL (ref 0–40)

## 2022-03-18 MED ORDER — INSULIN GLARGINE-YFGN 100 UNIT/ML ~~LOC~~ SOLN
10.0000 [IU] | Freq: Every day | SUBCUTANEOUS | Status: DC
  Administered 2022-03-18: 10 [IU] via SUBCUTANEOUS
  Filled 2022-03-18 (×2): qty 0.1

## 2022-03-18 MED ORDER — ENOXAPARIN SODIUM 40 MG/0.4ML IJ SOSY
40.0000 mg | PREFILLED_SYRINGE | Freq: Every day | INTRAMUSCULAR | Status: DC
  Administered 2022-03-18 – 2022-03-24 (×6): 40 mg via SUBCUTANEOUS
  Filled 2022-03-18 (×6): qty 0.4

## 2022-03-18 MED ORDER — DILTIAZEM HCL 25 MG/5ML IV SOLN
INTRAVENOUS | Status: AC
  Administered 2022-03-18: 10 mg
  Filled 2022-03-18: qty 5

## 2022-03-18 MED ORDER — IOHEXOL 350 MG/ML SOLN
80.0000 mL | Freq: Once | INTRAVENOUS | Status: AC | PRN
  Administered 2022-03-18: 80 mL via INTRAVENOUS

## 2022-03-18 MED ORDER — ASPIRIN 81 MG PO CHEW
81.0000 mg | CHEWABLE_TABLET | Freq: Every day | ORAL | Status: DC
  Administered 2022-03-18 – 2022-03-24 (×7): 81 mg via ORAL
  Filled 2022-03-18 (×7): qty 1

## 2022-03-18 MED ORDER — NITROGLYCERIN 0.4 MG SL SUBL
SUBLINGUAL_TABLET | SUBLINGUAL | Status: AC
  Administered 2022-03-18: 0.8 mg
  Filled 2022-03-18: qty 2

## 2022-03-18 MED ORDER — SODIUM CHLORIDE 0.9% FLUSH
3.0000 mL | Freq: Two times a day (BID) | INTRAVENOUS | Status: DC
  Administered 2022-03-18 – 2022-03-24 (×9): 3 mL via INTRAVENOUS

## 2022-03-18 MED ORDER — ROSUVASTATIN CALCIUM 20 MG PO TABS
20.0000 mg | ORAL_TABLET | Freq: Every day | ORAL | Status: DC
Start: 2022-03-18 — End: 2022-03-24
  Administered 2022-03-18 – 2022-03-24 (×7): 20 mg via ORAL
  Filled 2022-03-18 (×7): qty 1

## 2022-03-18 MED ORDER — CEPHALEXIN 500 MG PO CAPS
500.0000 mg | ORAL_CAPSULE | Freq: Two times a day (BID) | ORAL | Status: AC
  Administered 2022-03-18 – 2022-03-21 (×8): 500 mg via ORAL
  Filled 2022-03-18 (×8): qty 1

## 2022-03-18 MED ORDER — IVABRADINE HCL 5 MG PO TABS
10.0000 mg | ORAL_TABLET | ORAL | Status: AC
  Administered 2022-03-18: 10 mg via ORAL
  Filled 2022-03-18: qty 2

## 2022-03-18 MED ORDER — LIVING WELL WITH DIABETES BOOK
Freq: Once | Status: DC
  Filled 2022-03-18: qty 1

## 2022-03-18 MED ORDER — POTASSIUM CHLORIDE CRYS ER 20 MEQ PO TBCR
40.0000 meq | EXTENDED_RELEASE_TABLET | Freq: Two times a day (BID) | ORAL | Status: AC
Start: 2022-03-18 — End: 2022-03-18
  Administered 2022-03-18 (×2): 40 meq via ORAL
  Filled 2022-03-18 (×2): qty 2

## 2022-03-18 MED ORDER — CEPHALEXIN 250 MG PO CAPS
250.0000 mg | ORAL_CAPSULE | Freq: Two times a day (BID) | ORAL | Status: DC
Start: 2022-03-18 — End: 2022-03-18

## 2022-03-18 NOTE — TOC Progression Note (Signed)
Transition of Care [facility]) - Progression Note    Patient Details  Name: HODGES TREIBER MRN: [id number] Date of Birth: 1971-04-22  Transition of Care [facility]) CM/SW Contact  Beckie Busing, RN Phone [phone]  03/18/2022, 1:44 PM  Clinical Narrative:    51 year old male admitted from home with c/o chest pain shortness of breath, leg swelling. Currently there are no TOC needs.  Transition of Care [facility]) Screening Note   Patient Details  Name: MUREL SHENBERGER Date of Birth: 1970-12-13   Transition of Care [facility]) CM/SW Contact:    Beckie Busing, RN Phone Number: 03/18/2022, 1:50 PM    Transition of Care Department [facility]) has reviewed patient and no TOC needs have been identified at this time. We will continue to monitor patient advancement through interdisciplinary progression rounds.             Expected Discharge Plan and Services                                                 Social Determinants of Health (SDOH) Interventions    Readmission Risk Interventions     No data to display

## 2022-03-18 NOTE — Plan of Care (Signed)
Brief Nutrition Note  RD consulted for nutrition education regarding diabetes.  Spoke with pt at bedside who reports having just spoke with Diabetes Coordinate about an hour ago. Pt is requesting meal plans to assist with DM management at home. RD to place outpatient DM diet education referral for assistance with meal planning.  Pt reports that he typically purchases what is easy and convenient. He states that he does not eat a lot of vegetables because these are less convenient to obtain and cook. He reports drinking a wide variety of beverages including some sugar-sweetened beverages. He reports that his meals are not always very balanced.  Lab Results  Component Value Date   HGBA1C 9.8 (H) 03/18/2022    RD provided "Plate Method for Diabetes" handout from the Academy of [employer] and Dietetics. Discussed different food groups and their effects on blood sugar, emphasizing carbohydrate-containing foods. Provided list of carbohydrates and recommended serving sizes of common foods.  Discussed importance of controlled and consistent carbohydrate intake throughout the day. Provided examples of ways to balance meals/snacks and encouraged intake of high-fiber, whole grain complex carbohydrates. Teach back method used.  Expect fair to good compliance.  Current diet order is Heart Healthy/Carb Modified, patient is consuming approximately 100% of meals at this time. Labs and medications reviewed. No further nutrition interventions warranted at this time. If additional nutrition issues arise, please re-consult RD.   Mertie Clause, MS, RD, LDN Inpatient Clinical Dietitian Please see AMiON for contact information.

## 2022-03-18 NOTE — Progress Notes (Signed)
Bilateral LE duplex study completed. Please see CV Proc for preliminary results.  Stellah Donovan BS, RVT 03/18/2022 1:01 PM

## 2022-03-18 NOTE — Progress Notes (Addendum)
Inpatient Diabetes Program Recommendations  AACE/ADA: New Consensus Statement on Inpatient Glycemic Control (2015)  Target Ranges:  Prepandial:   less than 140 mg/dL      Peak postprandial:   less than 180 mg/dL (1-2 hours)      Critically ill patients:  140 - 180 mg/dL   Lab Results  Component Value Date   GLUCAP 246 (H) 03/18/2022   HGBA1C 9.8 (H) 03/18/2022    Review of Glycemic Control  Latest Reference Range & Units 03/17/22 21:04 03/18/22 06:04  Glucose-Capillary 70 - 99 mg/dL 443 (H) 154 (H)  (H): Data is abnormally high Diabetes history: Type 2 DM Outpatient Diabetes medications: Semglee 10 units QD, Jardiance 10 mg QD, Metformin 2000 mg QHS Current orders for Inpatient glycemic control: Novolog 0-20 units TID, Jardiance 10 mg QD, Semglee 10 units QD  Inpatient Diabetes Program Recommendations:    Consider changing diet to carb modified and increasing Semglee to 16 units QD. Secure chat sent to md. Also, question if patient could benefit from [facility]. Will plan to see.   Addendum: Spoke with patient at length regarding outpatient diabetes management. Patient was recently started on [facility] outpatient; admits to missing doses and drinking sodas. Reviewed patient's current A1c of 9.8%. Explained what a A1c is and what it measures. Also reviewed goal A1c with patient, importance of good glucose control @ home, and blood sugar goals. Reviewed patho of DM, role of pancreas, need for insulin, survival skills, interventions, impact of missed doses, interventions, impact of glycemic control from cardiac perspective, vascular changes and commorbidities.  Patient will need a meter at discharge. Blood glucose meter #[id number]. Reviewed when to check CBGs and call MD. Patient has not been checking blood sugars at home. Reviewed Freestyle libre, application, risks and benefits and long term cost. Patient is interested in trying device. Ordered for application received from Dr Jacques Navy  at bedside.  Dietitian consult, and outpatient education referral placed.  Admits to drinking sodas and sugary beverages. Reviewed alternatives, how to read nutritional labels, carb counting and daily alottments.  No further questions at this time.  Thanks, Lujean Rave, MSN, RNC-OB Diabetes Coordinator [phone] (8a-5p)

## 2022-03-18 NOTE — Discharge Summary (Incomplete)
Name: Leroy Simmons MRN: [id number] DOB: [birth year] 51 y.o. PCP: de [place], Raymond J, MD  Date of Admission: 03/16/2022  7:28 PM Date of Discharge:  Attending Physician: Mercie Eon, MD  Discharge Diagnosis: 1.  Acute decompensation of heart failure with preserved ejection fraction 2.  Coronary artery disease involving LAD 3.  Mild cellulitis of the right lower extremity 4.  Uncontrolled type 2 diabetes mellitus with hyperglycemia 5.  CKD 3A  6.  Depression   Discharge Medications: Allergies as of 03/18/2022   No Known Allergies   Med Rec must be completed prior to using this [facility]***       Disposition and follow-up:   [name] was discharged from [facility] in {DISCHARGE CONDITION:19696} condition.  At the hospital follow up visit please address:   Labs / imaging needed at time of follow-up: ***  Pending labs/ test needing follow-up: HIV screening  Follow-up Appointments:   Hospital Course: 51 year old male with HFpEF, hypertension, hyperlipidemia, uncontrolled diabetes, and depression who presented with 83-month history of progressive exertional dyspnea, 1 week history of lower extremity swelling, and roughly 2-day history of chest tightness.  He admitted to not taking his Lasix consistently.  Labs on admission were pertinent for: WBC 19, BUN 25, creatinine 1.54, potassium 3.2, serial high-sensitivity troponin 31>32>31, BNP 57, A1c 9.8.  EKG did not reveal any significant ischemic changes since prior.  Chest Pain Cardiology was consulted.  He underwent an echocardiogram which showed stable EF at 50 to 55%, no regional wall motion abnormalities, mild AR.  He subsequently underwent a coronary CT on hospital day 1 which revealed significant proximal to mid LAD stenosis. He underwent a LHC on hospital day #4 which revealed ***  CHF exacerbation He was noted to be volume overloaded on admission and underwent diuresis throughout his  hospitalization. Suspect this is attributable to missing some doses of lasix prior to admission. Echocardiogram was performed as noted above.  Admission weight 98kg.  Discharge weight:***  Cellulitis He was also noted to have cellulitis involving the mid right lower leg.  Nonpurulent.  Aside from the elevated WBC which decreased from 19>12, he did not have any other systemic symptoms.  Started on a course of Augmentin.  CKD He is also noted to have a new increase in his serum creatinine on admission compared to one year prior.  Urine microalbumin to creatinine ratio was over 3000 in March of this year.  Renal ultrasound was obtained this admission and revealed increased echogenicity consistent with chronic renal disease.  Suspect his renal disease is primarily attributed to uncontrolled diabetes.    Diabetes A1c 9.8 this admission  Depression The hospitalist service was called on the night of admission after patient had told his bedside RN that in the past, he had experienced suicidal thoughts.  He was evaluated further prior service and denied any current suicidal thoughts.  Discharge Vitals:   BP 131/89 (BP Location: Right Arm)   Pulse 73   Temp 98.3 F (36.8 C)   Resp 10   Ht 5\' 7"  (1.702 m)   Wt 94.5 kg   SpO2 93%   BMI 32.63 kg/m   Pertinent Labs, Studies, and Procedures:     Latest Ref Rng & Units 03/17/2022    9:32 AM 03/16/2022    7:45 PM 06/26/2021    5:31 PM  CBC  WBC 4.0 - 10.5 K/uL 12.6  19.0  7.5   Hemoglobin 13.0 - 17.0 g/dL 13.4  13.7  14.6   Hematocrit 39.0 - 52.0 % 38.4  40.0  42.2   Platelets 150 - 400 K/uL 265  335  242       Latest Ref Rng & Units 03/18/2022    4:34 AM 03/17/2022   12:49 PM 03/16/2022    7:45 PM  CMP  Glucose 70 - 99 mg/dL 235  324  366   BUN 6 - 20 mg/dL 26  25  25    Creatinine 0.61 - 1.24 mg/dL 1.48  1.47  1.54   Sodium 135 - 145 mmol/L 139  136  136   Potassium 3.5 - 5.1 mmol/L 3.5  3.2  3.3   Chloride 98 - 111 mmol/L 99  95  97    CO2 22 - 32 mmol/L 27  29  25    Calcium 8.9 - 10.3 mg/dL 8.5  8.7  9.3   Total Protein 6.5 - 8.1 g/dL   7.4   Total Bilirubin 0.3 - 1.2 mg/dL   1.2   Alkaline Phos 38 - 126 U/L   103   AST 15 - 41 U/L   15   ALT 0 - 44 U/L   19    BNP 57 serial high-sensitivity troponin 31>32>31 D-dimer 0.4. Lipid panel: Total cholesterol 152, HDL 40, LDL 81 A1c 9.8  US RENAL  Result Date: 03/17/2022 CLINICAL DATA:  Acute kidney injury. EXAM: RENAL / URINARY TRACT ULTRASOUND COMPLETE COMPARISON:  None Available. FINDINGS: Right Kidney: Renal measurements: 12 x 5.7 x 5.6 cm = volume: 200 mL. Borderline increased parenchymal echogenicity. No hydronephrosis. No visualized stone or focal lesion. Left Kidney: Renal measurements: 11.6 x 5.9 x 5.1 cm = volume: 181 mL. Borderline increased parenchymal echogenicity. No hydronephrosis. No visualized stone or focal lesion. Bladder: Appears normal for degree of bladder distention. Other: None. IMPRESSION: 1. No obstructive uropathy.  No focal renal abnormality or stone. 2. Borderline increased renal parenchymal echogenicity can be seen in the setting of chronic medical renal disease. Electronically Signed   By: Keith Rake M.D.   On: 03/17/2022 18:02   ECHOCARDIOGRAM COMPLETE  Result Date: 03/17/2022    ECHOCARDIOGRAM REPORT   Patient Name:   Leroy Simmons Date of Exam: 03/17/2022 Medical Rec #:  [id number]      Height:       68.0 in Accession #:    [id number]     Weight:       216.0 lb Date of Birth:  Jan 24, 1971      BSA:          2.112 m Patient Age:    63 years       BP:           124/75 mmHg Patient Gender: M              HR:           79 bpm. Exam Location:  Inpatient Procedure: 2D Echo, Cardiac Doppler and Color Doppler Indications:    CHF  History:        Patient has no prior history of Echocardiogram examinations.                 Risk Factors:Hypertension and Diabetes.  Sonographer:    Jyl Heinz Referring Phys: [place]  1. Left  ventricular ejection fraction, by estimation, is 55 to 60%. The left ventricle has normal function. The left ventricle has no regional wall motion abnormalities. Left ventricular diastolic parameters are  indeterminate.  2. Right ventricular systolic function is normal. The right ventricular size is normal. Tricuspid regurgitation signal is inadequate for assessing PA pressure.  3. The mitral valve is normal in structure. Trivial mitral valve regurgitation. No evidence of mitral stenosis.  4. The aortic valve has an indeterminant number of cusps. Aortic valve regurgitation is mild. No aortic stenosis is present.  5. The inferior vena cava is normal in size with greater than 50% respiratory variability, suggesting right atrial pressure of 3 mmHg. Comparison(s): No prior Echocardiogram. FINDINGS  Left Ventricle: Left ventricular ejection fraction, by estimation, is 55 to 60%. The left ventricle has normal function. The left ventricle has no regional wall motion abnormalities. The left ventricular internal cavity size was normal in size. There is  no left ventricular hypertrophy. Left ventricular diastolic parameters are indeterminate. Right Ventricle: The right ventricular size is normal. No increase in right ventricular wall thickness. Right ventricular systolic function is normal. Tricuspid regurgitation signal is inadequate for assessing PA pressure. Left Atrium: Left atrial size was normal in size. Right Atrium: Right atrial size was normal in size. Pericardium: There is no evidence of pericardial effusion. Mitral Valve: The mitral valve is normal in structure. Trivial mitral valve regurgitation. No evidence of mitral valve stenosis. Tricuspid Valve: The tricuspid valve is normal in structure. Tricuspid valve regurgitation is not demonstrated. No evidence of tricuspid stenosis. Aortic Valve: The aortic valve has an indeterminant number of cusps. Aortic valve regurgitation is mild. No aortic stenosis is present.  Aortic valve peak gradient measures 3.6 mmHg. Pulmonic Valve: The pulmonic valve was normal in structure. Pulmonic valve regurgitation is not visualized. No evidence of pulmonic stenosis. Aorta: The aortic root is normal in size and structure. Venous: The inferior vena cava is normal in size with greater than 50% respiratory variability, suggesting right atrial pressure of 3 mmHg. IAS/Shunts: No atrial level shunt detected by color flow Doppler.  LEFT VENTRICLE PLAX 2D LVIDd:         4.40 cm      Diastology LVIDs:         3.00 cm      LV e' medial:    5.40 cm/s LV PW:         1.40 cm      LV E/e' medial:  17.2 LV IVS:        1.30 cm      LV e' lateral:   5.70 cm/s LVOT diam:     2.00 cm      LV E/e' lateral: 16.3 LV SV:         55 LV SV Index:   26 LVOT Area:     3.14 cm  LV Volumes (MOD) LV vol d, MOD A2C: 110.0 ml LV vol d, MOD A4C: 117.0 ml LV vol s, MOD A2C: 47.3 ml LV vol s, MOD A4C: 48.5 ml LV SV MOD A2C:     62.7 ml LV SV MOD A4C:     117.0 ml LV SV MOD BP:      65.9 ml RIGHT VENTRICLE             IVC RV Basal diam:  2.90 cm     IVC diam: 1.80 cm RV Mid diam:    2.70 cm RV S prime:     11.30 cm/s TAPSE (M-mode): 1.8 cm LEFT ATRIUM             Index        RIGHT ATRIUM  Index LA diam:        4.20 cm 1.99 cm/m   RA Area:     12.70 cm LA Vol (A2C):   42.9 ml 20.32 ml/m  RA Volume:   24.40 ml  11.55 ml/m LA Vol (A4C):   48.4 ml 22.92 ml/m LA Biplane Vol: 45.6 ml 21.59 ml/m  AORTIC VALVE AV Area (Vmax): 2.96 cm AV Vmax:        94.70 cm/s AV Peak Grad:   3.6 mmHg LVOT Vmax:      89.20 cm/s LVOT Vmean:     64.600 cm/s LVOT VTI:       0.175 m  AORTA Ao Root diam: 3.00 cm Ao Asc diam:  3.20 cm MITRAL VALVE MV Area (PHT): 3.53 cm    SHUNTS MV Decel Time: 215 msec    Systemic VTI:  0.18 m MV E velocity: 92.90 cm/s  Systemic Diam: 2.00 cm MV A velocity: 80.90 cm/s MV E/A ratio:  1.15 Kardie Tobb DO Electronically signed by Thomasene Ripple DO Signature Date/Time: 03/17/2022/2:54:04 PM    Final    DG Chest 2  View  Result Date: 03/16/2022 CLINICAL DATA:  Chest pain. EXAM: CHEST - 2 VIEW COMPARISON:  Chest radiograph dated 08/02/2009. FINDINGS: Mild chronic bronchitic changes. No focal consolidation, pleural effusion, pneumothorax. The cardiac silhouette is within normal limits. No acute osseous pathology. IMPRESSION: No active cardiopulmonary disease. Electronically Signed   By: Elgie Collard M.D.   On: 03/16/2022 20:35     Discharge Instructions:   Signed:  Elige Radon, MD Internal Medicine Resident PGY-2 [facility] Pager: [phone] 03/18/2022 7:44 AM

## 2022-03-18 NOTE — Progress Notes (Signed)
Please send study for FFR. Dr. Nahum Sherrer 

## 2022-03-18 NOTE — Progress Notes (Addendum)
Progress Note  Patient Name: Leroy Simmons Date of Encounter: 03/18/2022  [facility] Cardiologist: Parke Poisson, MD   Subjective   Patient is feeling much better this AM, continued to have occasional chest pain overnight. Breathing has improved, continues to have lower extremity edema   Inpatient Medications    Scheduled Meds:  amLODipine  5 mg Oral Daily   amoxicillin-clavulanate  1 tablet Oral BID   busPIRone  7.5 mg Oral BID   empagliflozin  10 mg Oral q morning   emtricitabine-tenofovir AF  1 tablet Oral Daily   enoxaparin (LOVENOX) injection  40 mg Subcutaneous Daily   furosemide  40 mg Intravenous BID   insulin aspart  0-20 Units Subcutaneous TID WC   insulin glargine-yfgn  10 Units Subcutaneous Daily   ivabradine  10 mg Oral STAT   sertraline  25 mg Oral Daily   simvastatin  20 mg Oral QHS   Continuous Infusions:  PRN Meds: acetaminophen, ondansetron (ZOFRAN) IV, mouth rinse   Vital Signs    Vitals:   03/17/22 1954 03/17/22 2322 03/18/22 0411 03/18/22 0739  BP: 124/77 (!) 148/95 123/66 131/89  Pulse: 89  77 73  Resp: 18  16 10   Temp: 98.2 F (36.8 C) 98.1 F (36.7 C) 98.2 F (36.8 C) 98.3 F (36.8 C)  TempSrc: Oral Oral Oral   SpO2: 93%  96% 93%  Weight: 95.3 kg  94.5 kg   Height: 5\' 7"  (1.702 m)       Intake/Output Summary (Last 24 hours) at 03/18/2022 0756 Last data filed at 03/18/2022 0603 Gross per 24 hour  Intake 1460 ml  Output 4625 ml  Net -3165 ml      03/18/2022    4:11 AM 03/17/2022    7:54 PM 03/16/2022    7:37 PM  Last 3 Weights  Weight (lbs) 208 lb 5.4 oz 210 lb 1.6 oz 216 lb  Weight (kg) 94.5 kg 95.3 kg 97.977 kg      Telemetry    Sinus rhythm, brief episode of a narrow complex tachycardia yesterday evening suspicious for SVT - Personally Reviewed  ECG    No new tracings - Personally Reviewed  Physical Exam   GEN: No acute distress. Resting comfortably in the bed  Neck: No JVD Cardiac: RRR, no murmurs, rubs,  or gallops.  Respiratory: Clear to auscultation bilaterally. GI: Soft, nontender, non-distended  MS: 2+ edema in BLE; No deformity. Neuro:  Nonfocal  Psych: Normal affect   Labs    High Sensitivity Troponin:   Recent Labs  Lab 03/16/22 1945 03/16/22 2243 03/17/22 0932 03/17/22 1249  TROPONINIHS 30* 31* 32* 31*     Chemistry Recent Labs  Lab 03/16/22 1945 03/17/22 1249 03/18/22 0434  NA 136 136 139  K 3.3* 3.2* 3.5  CL 97* 95* 99  CO2 25 29 27   GLUCOSE 366* 324* 235*  BUN 25* 25* 26*  CREATININE 1.54* 1.47* 1.48*  CALCIUM 9.3 8.7* 8.5*  PROT 7.4  --   --   ALBUMIN 3.5  --   --   AST 15  --   --   ALT 19  --   --   ALKPHOS 103  --   --   BILITOT 1.2  --   --   GFRNONAA 55* 58* 58*  ANIONGAP 14 12 13     Lipids  Recent Labs  Lab 03/18/22 0434  CHOL 152  TRIG 153*  HDL 40*  LDLCALC 81  CHOLHDL  3.8    Hematology Recent Labs  Lab 03/16/22 1945 03/17/22 0932  WBC 19.0* 12.6*  RBC 4.43 4.24  HGB 13.7 13.4  HCT 40.0 38.4*  MCV 90.3 90.6  MCH 30.9 31.6  MCHC 34.3 34.9  RDW 11.4* 11.9  PLT 335 265   Thyroid No results for input(s): "TSH", "FREET4" in the last 168 hours.  BNP Recent Labs  Lab 03/16/22 1945  BNP 57.1    DDimer  Recent Labs  Lab 03/17/22 0932  DDIMER 0.40     Radiology    US RENAL  Result Date: 03/17/2022 CLINICAL DATA:  Acute kidney injury. EXAM: RENAL / URINARY TRACT ULTRASOUND COMPLETE COMPARISON:  None Available. FINDINGS: Right Kidney: Renal measurements: 12 x 5.7 x 5.6 cm = volume: 200 mL. Borderline increased parenchymal echogenicity. No hydronephrosis. No visualized stone or focal lesion. Left Kidney: Renal measurements: 11.6 x 5.9 x 5.1 cm = volume: 181 mL. Borderline increased parenchymal echogenicity. No hydronephrosis. No visualized stone or focal lesion. Bladder: Appears normal for degree of bladder distention. Other: None. IMPRESSION: 1. No obstructive uropathy.  No focal renal abnormality or stone. 2. Borderline  increased renal parenchymal echogenicity can be seen in the setting of chronic medical renal disease. Electronically Signed   By: Narda Rutherford M.D.   On: 03/17/2022 18:02   ECHOCARDIOGRAM COMPLETE  Result Date: 03/17/2022    ECHOCARDIOGRAM REPORT   Patient Name:   Leroy Simmons Date of Exam: 03/17/2022 Medical Rec #:  [id number]      Height:       68.0 in Accession #:    [id number]     Weight:       216.0 lb Date of Birth:  1971/08/26      BSA:          2.112 m Patient Age:    50 years       BP:           124/75 mmHg Patient Gender: M              HR:           79 bpm. Exam Location:  Inpatient Procedure: 2D Echo, Cardiac Doppler and Color Doppler Indications:    CHF  History:        Patient has no prior history of Echocardiogram examinations.                 Risk Factors:Hypertension and Diabetes.  Sonographer:    Cleatis Polka Referring Phys: [id number] LAURA R INGOLD IMPRESSIONS  1. Left ventricular ejection fraction, by estimation, is 55 to 60%. The left ventricle has normal function. The left ventricle has no regional wall motion abnormalities. Left ventricular diastolic parameters are indeterminate.  2. Right ventricular systolic function is normal. The right ventricular size is normal. Tricuspid regurgitation signal is inadequate for assessing PA pressure.  3. The mitral valve is normal in structure. Trivial mitral valve regurgitation. No evidence of mitral stenosis.  4. The aortic valve has an indeterminant number of cusps. Aortic valve regurgitation is mild. No aortic stenosis is present.  5. The inferior vena cava is normal in size with greater than 50% respiratory variability, suggesting right atrial pressure of 3 mmHg. Comparison(s): No prior Echocardiogram. FINDINGS  Left Ventricle: Left ventricular ejection fraction, by estimation, is 55 to 60%. The left ventricle has normal function. The left ventricle has no regional wall motion abnormalities. The left ventricular internal cavity size was normal in  size. There is  no left ventricular hypertrophy. Left ventricular diastolic parameters are indeterminate. Right Ventricle: The right ventricular size is normal. No increase in right ventricular wall thickness. Right ventricular systolic function is normal. Tricuspid regurgitation signal is inadequate for assessing PA pressure. Left Atrium: Left atrial size was normal in size. Right Atrium: Right atrial size was normal in size. Pericardium: There is no evidence of pericardial effusion. Mitral Valve: The mitral valve is normal in structure. Trivial mitral valve regurgitation. No evidence of mitral valve stenosis. Tricuspid Valve: The tricuspid valve is normal in structure. Tricuspid valve regurgitation is not demonstrated. No evidence of tricuspid stenosis. Aortic Valve: The aortic valve has an indeterminant number of cusps. Aortic valve regurgitation is mild. No aortic stenosis is present. Aortic valve peak gradient measures 3.6 mmHg. Pulmonic Valve: The pulmonic valve was normal in structure. Pulmonic valve regurgitation is not visualized. No evidence of pulmonic stenosis. Aorta: The aortic root is normal in size and structure. Venous: The inferior vena cava is normal in size with greater than 50% respiratory variability, suggesting right atrial pressure of 3 mmHg. IAS/Shunts: No atrial level shunt detected by color flow Doppler.  LEFT VENTRICLE PLAX 2D LVIDd:         4.40 cm      Diastology LVIDs:         3.00 cm      LV e' medial:    5.40 cm/s LV PW:         1.40 cm      LV E/e' medial:  17.2 LV IVS:        1.30 cm      LV e' lateral:   5.70 cm/s LVOT diam:     2.00 cm      LV E/e' lateral: 16.3 LV SV:         55 LV SV Index:   26 LVOT Area:     3.14 cm  LV Volumes (MOD) LV vol d, MOD A2C: 110.0 ml LV vol d, MOD A4C: 117.0 ml LV vol s, MOD A2C: 47.3 ml LV vol s, MOD A4C: 48.5 ml LV SV MOD A2C:     62.7 ml LV SV MOD A4C:     117.0 ml LV SV MOD BP:      65.9 ml RIGHT VENTRICLE             IVC RV Basal diam:  2.90  cm     IVC diam: 1.80 cm RV Mid diam:    2.70 cm RV S prime:     11.30 cm/s TAPSE (M-mode): 1.8 cm LEFT ATRIUM             Index        RIGHT ATRIUM           Index LA diam:        4.20 cm 1.99 cm/m   RA Area:     12.70 cm LA Vol (A2C):   42.9 ml 20.32 ml/m  RA Volume:   24.40 ml  11.55 ml/m LA Vol (A4C):   48.4 ml 22.92 ml/m LA Biplane Vol: 45.6 ml 21.59 ml/m  AORTIC VALVE AV Area (Vmax): 2.96 cm AV Vmax:        94.70 cm/s AV Peak Grad:   3.6 mmHg LVOT Vmax:      89.20 cm/s LVOT Vmean:     64.600 cm/s LVOT VTI:       0.175 m  AORTA Ao Root diam: 3.00 cm Ao Asc diam:  3.20  cm MITRAL VALVE MV Area (PHT): 3.53 cm    SHUNTS MV Decel Time: 215 msec    Systemic VTI:  0.18 m MV E velocity: 92.90 cm/s  Systemic Diam: 2.00 cm MV A velocity: 80.90 cm/s MV E/A ratio:  1.15 Kardie Tobb DO Electronically signed by Thomasene Ripple DO Signature Date/Time: 03/17/2022/2:54:04 PM    Final    DG Chest 2 View  Result Date: 03/16/2022 CLINICAL DATA:  Chest pain. EXAM: CHEST - 2 VIEW COMPARISON:  Chest radiograph dated 08/02/2009. FINDINGS: Mild chronic bronchitic changes. No focal consolidation, pleural effusion, pneumothorax. The cardiac silhouette is within normal limits. No acute osseous pathology. IMPRESSION: No active cardiopulmonary disease. Electronically Signed   By: Elgie Collard M.D.   On: 03/16/2022 20:35    Cardiac Studies   Echocardiogram 03/17/22  1. Left ventricular ejection fraction, by estimation, is 55 to 60%. The  left ventricle has normal function. The left ventricle has no regional  wall motion abnormalities. Left ventricular diastolic parameters are  indeterminate.   2. Right ventricular systolic function is normal. The right ventricular  size is normal. Tricuspid regurgitation signal is inadequate for assessing  PA pressure.   3. The mitral valve is normal in structure. Trivial mitral valve  regurgitation. No evidence of mitral stenosis.   4. The aortic valve has an indeterminant number of  cusps. Aortic valve  regurgitation is mild. No aortic stenosis is present.   5. The inferior vena cava is normal in size with greater than 50%  respiratory variability, suggesting right atrial pressure of 3 mmHg.   Patient Profile     51 y.o. male with a hx of HTN, DM, HLD, ? CHF  in past and anxiety who was seen 03/17/2022 for the evaluation of chest pain and CHF at the request of Dr. Manus Gunning.  Assessment & Plan    Chronic HFpEF  - Patient reported having a history of heart failure, presented with SOB on exertion, leg swelling, and intermittent chest pain - Echo this admission showed EF 55-60%, indeterminate diastolic function  - Was taking lasix PTA, did report missing a day or two  - CXR showed no cardiopulmonary disease, BNP within normal limits  - Has been given 2 doses of IV lasix 40 mg so far-- output 4.6 L urine yesterday, currently net -3.16 L since admission. - Renal function stable - Home jardiance, lisinopril currently held as patient receives IV diuretics and has decreased renal function   Chest Pain  Troponin Elevation  - hsTn 30>>31>>32>>31. This is a very minimal elevation with a flat trend-- not consistent with ACS, suspect troponin leak in the setting of mild CHF exacerbation  - Patient concerned about waxing and waning chest pain, has apparently been referred to cardiology by PCP but failed to answer phone calls to arrange appointment. Concern that patient will not show up if we tried to scheduled ischemic eval in the outpatient setting  - Did have a normal stress test in 2021 - Plan for coronary CT today  HTN  - Continue amlodipine  - Lisinopril held for now as patient is receiving IV diuretics, renal function is decreased   Otherwise per primary  - Poorly controlled Type 2 diabetes mellitus - AKI vs CKD   - Mild cellulitis of the right lower extremity  - Depression/anxiety  - High risk sexual activity       For questions or updates, please contact [facility] Please consult [email or web address] for contact info under  Signed, Jonita Albee, PA-C  03/18/2022, 7:56 AM    Patient seen and examined with KJ PA-C.  Agree as above, with the following exceptions and changes as noted below. Off an on chest pain, but improved volume status. Gen: NAD, CV: RRR, no murmurs, Lungs: clear, Abd: soft, Extrem: Warm, well perfused, 1+ edema, rle erythema improving Neuro/Psych: alert and oriented x 3, normal mood and affect. All available labs, radiology testing, previous records reviewed. 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 Plan for cath on Monday. D/w patient who agrees. Continue IV diuresis today, reassess transition to oral over weekend. DM coordinator in room for my visit, agree with aggressive diabetes management.   INFORMED CONSENT: I have reviewed the risks, indications, and alternatives to cardiac catheterization, possible angioplasty, and stenting with the patient. Risks include but are not limited to bleeding, infection, vascular injury, stroke, myocardial infarction, arrhythmia, kidney injury, radiation-related injury in the case of prolonged fluoroscopy use, emergency cardiac surgery, and death. The patient understands the risks of serious complication is 1-2 in 1000 with diagnostic cardiac cath and 1-2% or less with angioplasty/stenting.    Parke Poisson, MD 03/18/22 1:56 PM

## 2022-03-18 NOTE — Progress Notes (Addendum)
HD#0 Subjective:  Overnight Events: NAEON.  Patient seen and assessed at bedside.  Patient reports chest pain improving.  Denies shortness of breath orthopnea.  Patient reports dry weight ~210 lb.  Eating and drinking well. No problems with bowel movement.  Objective:  Vital signs in last 24 hours: Vitals:   03/18/22 0900 03/18/22 0930 03/18/22 1020 03/18/22 1107  BP:    101/68  Pulse: 76 75 73 68  Resp: 18 18 19 16   Temp:    97.8 F (36.6 C)  TempSrc:      SpO2: 94% 93% 95% 93%  Weight:      Height:       Supplemental O2: Room Air SpO2: 93 %   Physical Exam:  Physical Exam Constitutional:      General: He is not in acute distress.    Appearance: He is not ill-appearing.  HENT:     Head: Normocephalic.  Eyes:     General:        Right eye: No discharge.        Left eye: No discharge.     Conjunctiva/sclera: Conjunctivae normal.  Cardiovascular:     Rate and Rhythm: Normal rate and regular rhythm.  Pulmonary:     Effort: Pulmonary effort is normal. No respiratory distress.     Breath sounds: Normal breath sounds. No wheezing.  Abdominal:     General: Bowel sounds are normal. There is no distension.     Palpations: Abdomen is soft.  Musculoskeletal:     Comments: Increased erythema and edema, right worse than left lower extremity.  No pain to palpation.  Skin:    General: Skin is warm.  Neurological:     Mental Status: He is alert and oriented to person, place, and time.  Psychiatric:        Mood and Affect: Mood normal.     Filed Weights   03/16/22 1937 03/17/22 1954 03/18/22 0411  Weight: 98 kg 95.3 kg 94.5 kg     Intake/Output Summary (Last 24 hours) at 03/18/2022 1326 Last data filed at 03/18/2022 1019 Gross per 24 hour  Intake 1600 ml  Output 3725 ml  Net -2125 ml   Net IO Since Admission: -3,450 mL [03/18/22 1326]  Pertinent Labs:    Latest Ref Rng & Units 03/17/2022    9:32 AM 03/16/2022    7:45 PM 06/26/2021    5:31 PM  CBC  WBC 4.0  - 10.5 K/uL 12.6  19.0  7.5   Hemoglobin 13.0 - 17.0 g/dL 13.4  13.7  14.6   Hematocrit 39.0 - 52.0 % 38.4  40.0  42.2   Platelets 150 - 400 K/uL 265  335  242        Latest Ref Rng & Units 03/18/2022    4:34 AM 03/17/2022   12:49 PM 03/16/2022    7:45 PM  CMP  Glucose 70 - 99 mg/dL 235  324  366   BUN 6 - 20 mg/dL 26  25  25    Creatinine 0.61 - 1.24 mg/dL 1.48  1.47  1.54   Sodium 135 - 145 mmol/L 139  136  136   Potassium 3.5 - 5.1 mmol/L 3.5  3.2  3.3   Chloride 98 - 111 mmol/L 99  95  97   CO2 22 - 32 mmol/L 27  29  25    Calcium 8.9 - 10.3 mg/dL 8.5  8.7  9.3   Total Protein 6.5 - 8.1 g/dL  7.4   Total Bilirubin 0.3 - 1.2 mg/dL   1.2   Alkaline Phos 38 - 126 U/L   103   AST 15 - 41 U/L   15   ALT 0 - 44 U/L   19     Imaging: VAS [place] LOWER EXTREMITY VENOUS (DVT)  Result Date: 03/18/2022  Lower Venous DVT Study Patient Name:  JOSEALBERTO MONTALTO  Date of Exam:   03/18/2022 Medical Rec #: [id number]       Accession #:    [id number] Date of Birth: 11/20/1970       Patient Gender: M Patient Age:   65 years Exam Location:  [facility] Procedure:      VAS [place] LOWER EXTREMITY VENOUS (DVT) Referring Phys: Glynn Octave --------------------------------------------------------------------------------  Indications: Edema.  Performing Technologist: Magdalene River  Examination Guidelines: A complete evaluation includes B-mode imaging, spectral Doppler, color Doppler, and power Doppler as needed of all accessible portions of each vessel. Bilateral testing is considered an integral part of a complete examination. Limited examinations for reoccurring indications may be performed as noted. The reflux portion of the exam is performed with the patient in reverse Trendelenburg.  +---------+---------------+---------+-----------+----------+--------------+ RIGHT    CompressibilityPhasicitySpontaneityPropertiesThrombus Aging +---------+---------------+---------+-----------+----------+--------------+  CFV      Full           Yes      Yes                                 +---------+---------------+---------+-----------+----------+--------------+ SFJ      Full                                                        +---------+---------------+---------+-----------+----------+--------------+ FV Prox  Full                                                        +---------+---------------+---------+-----------+----------+--------------+ FV Mid   Full                                                        +---------+---------------+---------+-----------+----------+--------------+ FV DistalFull                                                        +---------+---------------+---------+-----------+----------+--------------+ PFV      Full                                                        +---------+---------------+---------+-----------+----------+--------------+ POP      Full           Yes      Yes                                 +---------+---------------+---------+-----------+----------+--------------+  PTV      Full                                                        +---------+---------------+---------+-----------+----------+--------------+ PERO     Full                                                        +---------+---------------+---------+-----------+----------+--------------+   +---------+---------------+---------+-----------+----------+--------------+ LEFT     CompressibilityPhasicitySpontaneityPropertiesThrombus Aging +---------+---------------+---------+-----------+----------+--------------+ CFV      Full           Yes      Yes                                 +---------+---------------+---------+-----------+----------+--------------+ SFJ      Full                                                        +---------+---------------+---------+-----------+----------+--------------+ FV Prox  Full                                                         +---------+---------------+---------+-----------+----------+--------------+ FV Mid   Full                                                        +---------+---------------+---------+-----------+----------+--------------+ FV DistalFull                                                        +---------+---------------+---------+-----------+----------+--------------+ PFV      Full                                                        +---------+---------------+---------+-----------+----------+--------------+ POP      Full           Yes      Yes                                 +---------+---------------+---------+-----------+----------+--------------+ PTV      Full                                                        +---------+---------------+---------+-----------+----------+--------------+  PERO     Full                                                        +---------+---------------+---------+-----------+----------+--------------+    Summary: BILATERAL: - No evidence of deep vein thrombosis seen in the lower extremities, bilaterally. -No evidence of popliteal cyst, bilaterally.   *See table(s) above for measurements and observations.    Preliminary    CT CORONARY MORPH W/CTA COR W/SCORE W/CA W/CM &/OR WO/CM  Addendum Date: 03/18/2022   ADDENDUM REPORT: 03/18/2022 11:35 HISTORY: 51 yo male with chest pain/anginal equiv, intermediate CAD risk, not treadmill candidate EXAM: Cardiac/Coronary CTA TECHNIQUE: The patient was scanned on a [employer]. PROTOCOL: A 120 kV prospective scan was triggered in the descending thoracic aorta at 111 HU's. Axial non-contrast 3 mm slices were carried out through the heart. The data set was analyzed on a dedicated work station and scored using the Agatson method. Gantry rotation speed was 250 msecs and collimation was .6 mm. Beta blockade and 0.8 mg of sl NTG was given. The 3D data set was reconstructed  in 5% intervals of the 35-75 % of the R-R cycle. Diastolic phases were analyzed on a dedicated work station using MPR, MIP and VRT modes. The patient received 79mL OMNIPAQUE IOHEXOL 350 MG/ML SOLN of contrast. FINDINGS: Quality: Good, HR 68 Coronary calcium score: The patient's coronary artery calcium score is 431, which places the patient in the 98th percentile. Coronary arteries: Normal coronary origins.  Right dominance. Right Coronary Artery: Dominant. Mild mixed proximal 25-49% stenosis (CADRADS1). There is plaque at the crux with positive remodeling and spotty calcification. There is minimal 1-24% mixed distal vessel stenosis. Left Main Coronary Artery: Normal. Bifurcates into the LAD and LCx arteries. Left Anterior Descending Coronary Artery: Anterior artery which wraps around the apex. There is eccentric ostial calcification and spotty proximal calcification with associated low attenuation plaque and 70-99% stenoisis (CADRADS4a) - the LAD measures 1.02 mm at its smallest point. The proximal segment of atherosclerosis extends 50 mm from the LAD/LCX bifurcation. D1 branch has minimal mixed proximal disease. Left Circumflex Artery: Lateral wall vessel. There is a mild 25-49% non-calcified stenosis of the proximal vessel (CADRADS2). Aorta: Normal size, 32 mm at the mid ascending aorta (level of the PA bifurcation) measured double oblique. No calcifications. No dissection. Aortic Valve: Trileaflet. No calcifications. Other findings: Normal pulmonary vein drainage into the left atrium. Normal left atrial appendage without a thrombus. Normal size of the pulmonary artery. IMPRESSION: 1. 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 CT FFR will be performed and reported separately. 2. Coronary calcium score of 431. This was 98th percentile for age and sex matched control. 3. Normal coronary origin with right dominance. 4. Definitive cardiac catheterization is recommended.  Electronically Signed   By: Chrystie Nose M.D.   On: 03/18/2022 11:35   Result Date: 03/18/2022 EXAM: OVER-READ INTERPRETATION  CT CHEST The following report is a limited chest CT over-read performed by radiologist Dr. Trudie Reed of [facility] on 03/18/2022. The coronary calcium score and cardiac CTA interpretation by the cardiologist is attached. COMPARISON:  None Available. FINDINGS: Small left lower lobe pulmonary nodule measuring 8 mm abutting the left hemidiaphragm (axial image 42 of series 11). Within the visualized portions of the thorax  there is no acute consolidative airspace disease, no pleural effusions, no pneumothorax and no lymphadenopathy. Visualized portions of the upper abdomen are unremarkable. There are no aggressive appearing lytic or blastic lesions noted in the visualized portions of the skeleton. IMPRESSION: 1. 8 mm left lower lobe pulmonary nodule. Non-contrast chest CT at 6-12 months is recommended. If the nodule is stable at time of repeat CT, then future CT at 18-24 months (from today's scan) is considered optional for low-risk patients, but is recommended for high-risk patients. This recommendation follows the consensus statement: Guidelines for Management of Incidental Pulmonary Nodules Detected on CT Images: From the Fleischner Society 2017; Radiology 2017; 284:228-243. Electronically Signed: By: Vinnie Langton M.D. On: 03/18/2022 11:03   US RENAL  Result Date: 03/17/2022 CLINICAL DATA:  Acute kidney injury. EXAM: RENAL / URINARY TRACT ULTRASOUND COMPLETE COMPARISON:  None Available. FINDINGS: Right Kidney: Renal measurements: 12 x 5.7 x 5.6 cm = volume: 200 mL. Borderline increased parenchymal echogenicity. No hydronephrosis. No visualized stone or focal lesion. Left Kidney: Renal measurements: 11.6 x 5.9 x 5.1 cm = volume: 181 mL. Borderline increased parenchymal echogenicity. No hydronephrosis. No visualized stone or focal lesion. Bladder: Appears normal  for degree of bladder distention. Other: None. IMPRESSION: 1. No obstructive uropathy.  No focal renal abnormality or stone. 2. Borderline increased renal parenchymal echogenicity can be seen in the setting of chronic medical renal disease. Electronically Signed   By: Keith Rake M.D.   On: 03/17/2022 18:02   ECHOCARDIOGRAM COMPLETE  Result Date: 03/17/2022    ECHOCARDIOGRAM REPORT   Patient Name:   Leroy Simmons Date of Exam: 03/17/2022 Medical Rec #:  [id number]      Height:       68.0 in Accession #:    [id number]     Weight:       216.0 lb Date of Birth:  Feb 15, 1971      BSA:          2.112 m Patient Age:    51 years       BP:           124/75 mmHg Patient Gender: M              HR:           79 bpm. Exam Location:  Inpatient Procedure: 2D Echo, Cardiac Doppler and Color Doppler Indications:    CHF  History:        Patient has no prior history of Echocardiogram examinations.                 Risk Factors:Hypertension and Diabetes.  Sonographer:    Jyl Heinz Referring Phys: [place]  1. Left ventricular ejection fraction, by estimation, is 55 to 60%. The left ventricle has normal function. The left ventricle has no regional wall motion abnormalities. Left ventricular diastolic parameters are indeterminate.  2. Right ventricular systolic function is normal. The right ventricular size is normal. Tricuspid regurgitation signal is inadequate for assessing PA pressure.  3. The mitral valve is normal in structure. Trivial mitral valve regurgitation. No evidence of mitral stenosis.  4. The aortic valve has an indeterminant number of cusps. Aortic valve regurgitation is mild. No aortic stenosis is present.  5. The inferior vena cava is normal in size with greater than 50% respiratory variability, suggesting right atrial pressure of 3 mmHg. Comparison(s): No prior Echocardiogram. FINDINGS  Left Ventricle: Left ventricular ejection fraction, by estimation, is 55 to 60%.  The left ventricle  has normal function. The left ventricle has no regional wall motion abnormalities. The left ventricular internal cavity size was normal in size. There is  no left ventricular hypertrophy. Left ventricular diastolic parameters are indeterminate. Right Ventricle: The right ventricular size is normal. No increase in right ventricular wall thickness. Right ventricular systolic function is normal. Tricuspid regurgitation signal is inadequate for assessing PA pressure. Left Atrium: Left atrial size was normal in size. Right Atrium: Right atrial size was normal in size. Pericardium: There is no evidence of pericardial effusion. Mitral Valve: The mitral valve is normal in structure. Trivial mitral valve regurgitation. No evidence of mitral valve stenosis. Tricuspid Valve: The tricuspid valve is normal in structure. Tricuspid valve regurgitation is not demonstrated. No evidence of tricuspid stenosis. Aortic Valve: The aortic valve has an indeterminant number of cusps. Aortic valve regurgitation is mild. No aortic stenosis is present. Aortic valve peak gradient measures 3.6 mmHg. Pulmonic Valve: The pulmonic valve was normal in structure. Pulmonic valve regurgitation is not visualized. No evidence of pulmonic stenosis. Aorta: The aortic root is normal in size and structure. Venous: The inferior vena cava is normal in size with greater than 50% respiratory variability, suggesting right atrial pressure of 3 mmHg. IAS/Shunts: No atrial level shunt detected by color flow Doppler.  LEFT VENTRICLE PLAX 2D LVIDd:         4.40 cm      Diastology LVIDs:         3.00 cm      LV e' medial:    5.40 cm/s LV PW:         1.40 cm      LV E/e' medial:  17.2 LV IVS:        1.30 cm      LV e' lateral:   5.70 cm/s LVOT diam:     2.00 cm      LV E/e' lateral: 16.3 LV SV:         55 LV SV Index:   26 LVOT Area:     3.14 cm  LV Volumes (MOD) LV vol d, MOD A2C: 110.0 ml LV vol d, MOD A4C: 117.0 ml LV vol s, MOD A2C: 47.3 ml LV vol s, MOD A4C: 48.5  ml LV SV MOD A2C:     62.7 ml LV SV MOD A4C:     117.0 ml LV SV MOD BP:      65.9 ml RIGHT VENTRICLE             IVC RV Basal diam:  2.90 cm     IVC diam: 1.80 cm RV Mid diam:    2.70 cm RV S prime:     11.30 cm/s TAPSE (M-mode): 1.8 cm LEFT ATRIUM             Index        RIGHT ATRIUM           Index LA diam:        4.20 cm 1.99 cm/m   RA Area:     12.70 cm LA Vol (A2C):   42.9 ml 20.32 ml/m  RA Volume:   24.40 ml  11.55 ml/m LA Vol (A4C):   48.4 ml 22.92 ml/m LA Biplane Vol: 45.6 ml 21.59 ml/m  AORTIC VALVE AV Area (Vmax): 2.96 cm AV Vmax:        94.70 cm/s AV Peak Grad:   3.6 mmHg LVOT Vmax:      89.20 cm/s LVOT  Vmean:     64.600 cm/s LVOT VTI:       0.175 m  AORTA Ao Root diam: 3.00 cm Ao Asc diam:  3.20 cm MITRAL VALVE MV Area (PHT): 3.53 cm    SHUNTS MV Decel Time: 215 msec    Systemic VTI:  0.18 m MV E velocity: 92.90 cm/s  Systemic Diam: 2.00 cm MV A velocity: 80.90 cm/s MV E/A ratio:  1.15 Kardie Tobb DO Electronically signed by Berniece Salines DO Signature Date/Time: 03/17/2022/2:54:04 PM    Final     Assessment/Plan:   Principal Problem:   CHF exacerbation ([place]) Active Problems:   Essential hypertension   Diabetes mellitus ([place])   Cellulitis of right lower extremity   AKI (acute kidney injury) ([place])   Coronary atherosclerosis of native coronary artery   Patient Summary: MARZELL LEDDON is a 51 y.o. with a pertinent PMH of HFpEF, type 2 diabetes, hypertension, who presented with acute chest tightness and admitted for acute decompensated HFpEF exacerbation.   CAD with severe stenosis of proximal LAD Chest pain CTA coronary shows severe CAD in the proximal LAD.  Cardiology plan for left heart cath on Monday.  We will maximize medical therapy for his cardiovascular risk. [facility] cardiology recommendation -LDL 81.  Switch simvastatin to Crestor 20 mg -Add aspirin 81 mg -His diabetes also need better control  Acute on Chronic HFpEF exacerbation Has had net 3 L out yesterday with  good UOP 4.6 L.  Creatinine improving with diuresis suggest cardiorenal.  He still appears hypervolemic with JVD and LE edema despite appearing to be a dry weight (now 208 lb).  -Continue Lasix 40 mg twice daily -Replete potassium while diuresing -Continue Jardiance  AKI vs CKD Unknown baseline creatinine but evidence of kidney disease on ultrasound.  Creatinine improved with diuresis suggesting cardiorenal. -Continue IV Lasix -BMP in a.m. -Holding lisinopril  Right lower extremity cellulitis DVT has been ruled out.  We will switch to Keflex for treatment of his simple nonpurulent cellulitis.  Total treatment of 5 days  Uncontrolled type 2 diabetes  A1c of 9.8.  Elevated CBG however did not get his long-acting yesterday. -Continue Semglee 10 units, resistant sliding scale -Continue Jardiance -CBG before every meal and nightly  Hypertension Holding lisinopril  Depression/anxiety - Continue BuSpar and Zoloft  High risk sexual activity - Continue PrEP with [place] - HIV-negative  Diet: Heart Healthy carb modified IVF: None,None VTE: Enoxaparin Code: Full PT/OT recs: None, none. TOC recs:   Dispo: Anticipated discharge to Home in 3 days pending left heart cath.   Gaylan Gerold, DO 03/18/2022, 1:26 PM Pager: [phone]  Please contact the on call pager after 5 pm and on weekends at [phone].

## 2022-03-19 DIAGNOSIS — N179 Acute kidney failure, unspecified: Secondary | ICD-10-CM | POA: Diagnosis not present

## 2022-03-19 DIAGNOSIS — I509 Heart failure, unspecified: Secondary | ICD-10-CM

## 2022-03-19 DIAGNOSIS — L03115 Cellulitis of right lower limb: Secondary | ICD-10-CM | POA: Diagnosis not present

## 2022-03-19 DIAGNOSIS — I10 Essential (primary) hypertension: Secondary | ICD-10-CM

## 2022-03-19 DIAGNOSIS — I2510 Atherosclerotic heart disease of native coronary artery without angina pectoris: Secondary | ICD-10-CM

## 2022-03-19 LAB — BASIC METABOLIC PANEL
Anion gap: 11 (ref 5–15)
BUN: 34 mg/dL — ABNORMAL HIGH (ref 6–20)
CO2: 28 mmol/L (ref 22–32)
Calcium: 8.8 mg/dL — ABNORMAL LOW (ref 8.9–10.3)
Chloride: 95 mmol/L — ABNORMAL LOW (ref 98–111)
Creatinine, Ser: 1.57 mg/dL — ABNORMAL HIGH (ref 0.61–1.24)
GFR, Estimated: 54 mL/min — ABNORMAL LOW (ref >60)
Glucose, Bld: 243 mg/dL — ABNORMAL HIGH (ref 70–99)
Potassium: 4.6 mmol/L (ref 3.5–5.1)
Sodium: 134 mmol/L — ABNORMAL LOW (ref 135–145)

## 2022-03-19 LAB — MAGNESIUM: Magnesium: 2.4 mg/dL (ref 1.7–2.4)

## 2022-03-19 MED ORDER — METOPROLOL SUCCINATE ER 25 MG PO TB24
12.5000 mg | ORAL_TABLET | Freq: Every day | ORAL | Status: DC
  Administered 2022-03-19: 12.5 mg via ORAL
  Filled 2022-03-19: qty 1

## 2022-03-19 MED ORDER — FUROSEMIDE 40 MG PO TABS: 40.0000 mg | ORAL_TABLET | Freq: Every day | ORAL | Status: DC

## 2022-03-19 MED ORDER — FUROSEMIDE 40 MG PO TABS: 40.0000 mg | ORAL_TABLET | Freq: Two times a day (BID) | ORAL | Status: DC

## 2022-03-19 MED ORDER — INSULIN GLARGINE-YFGN 100 UNIT/ML ~~LOC~~ SOLN
16.0000 [IU] | Freq: Every day | SUBCUTANEOUS | Status: DC
Start: 2022-03-19 — End: 2022-03-20
  Administered 2022-03-19 – 2022-03-20 (×2): 16 [IU] via SUBCUTANEOUS
  Filled 2022-03-19 (×2): qty 0.16

## 2022-03-19 NOTE — Progress Notes (Deleted)
Progress Note  Patient Name: Leroy Simmons Date of Encounter: 03/19/2022  [facility] Cardiologist: Parke Poisson, MD   Subjective   Patient is feeling much better this AM, continued to have occasional chest pain overnight. Breathing has improved, continues to have lower extremity edema   Inpatient Medications    Scheduled Meds:  amLODipine  5 mg Oral Daily   aspirin  81 mg Oral Daily   busPIRone  7.5 mg Oral BID   cephALEXin  500 mg Oral Q12H   empagliflozin  10 mg Oral q morning   emtricitabine-tenofovir AF  1 tablet Oral Daily   enoxaparin (LOVENOX) injection  40 mg Subcutaneous Daily   furosemide  40 mg Oral BID   insulin aspart  0-20 Units Subcutaneous TID WC   insulin glargine-yfgn  16 Units Subcutaneous Daily   living well with diabetes book   Does not apply Once   rosuvastatin  20 mg Oral Daily   sertraline  25 mg Oral Daily   sodium chloride flush  3 mL Intravenous Q12H   Continuous Infusions:  PRN Meds: acetaminophen, ondansetron (ZOFRAN) IV, mouth rinse   Vital Signs    Vitals:   03/19/22 0500 03/19/22 0814 03/19/22 0900 03/19/22 1148  BP:   130/86 140/90  Pulse:   75 76  Resp:   14 18  Temp:  97.6 F (36.4 C)  97.9 F (36.6 C)  TempSrc:  Oral  Oral  SpO2:   96% 98%  Weight: 94.6 kg     Height:        Intake/Output Summary (Last 24 hours) at 03/19/2022 1242 Last data filed at 03/19/2022 1230 Gross per 24 hour  Intake 480 ml  Output 2600 ml  Net -2120 ml      03/19/2022    5:00 AM 03/18/2022    4:11 AM 03/17/2022    7:54 PM  Last 3 Weights  Weight (lbs) 208 lb 8.9 oz 208 lb 5.4 oz 210 lb 1.6 oz  Weight (kg) 94.6 kg 94.5 kg 95.3 kg      Telemetry    Sinus rhythm, brief episode of a narrow complex tachycardia yesterday evening suspicious for SVT - Personally Reviewed  ECG    No new tracings - Personally Reviewed  Physical Exam   GEN: No acute distress. Resting comfortably in the bed  Neck: No JVD Cardiac: RRR, no murmurs,  rubs, or gallops.  Respiratory: Clear to auscultation bilaterally. GI: Soft, nontender, non-distended  MS: 2+ edema in BLE; No deformity. Neuro:  Nonfocal  Psych: Normal affect   Labs    High Sensitivity Troponin:   Recent Labs  Lab 03/16/22 1945 03/16/22 2243 03/17/22 0932 03/17/22 1249  TROPONINIHS 30* 31* 32* 31*     Chemistry Recent Labs  Lab 03/16/22 1945 03/17/22 1249 03/18/22 0434 03/19/22 0027  NA 136 136 139 134*  K 3.3* 3.2* 3.5 4.6  CL 97* 95* 99 95*  CO2 25 29 27 28   GLUCOSE 366* 324* 235* 243*  BUN 25* 25* 26* 34*  CREATININE 1.54* 1.47* 1.48* 1.57*  CALCIUM 9.3 8.7* 8.5* 8.8*  MG  --   --   --  2.4  PROT 7.4  --   --   --   ALBUMIN 3.5  --   --   --   AST 15  --   --   --   ALT 19  --   --   --   ALKPHOS 103  --   --   --  BILITOT 1.2  --   --   --   GFRNONAA 55* 58* 58* 54*  ANIONGAP 14 12 13 11     Lipids  Recent Labs  Lab 03/18/22 0434  CHOL 152  TRIG 153*  HDL 40*  LDLCALC 81  CHOLHDL 3.8    Hematology Recent Labs  Lab 03/16/22 1945 03/17/22 0932  WBC 19.0* 12.6*  RBC 4.43 4.24  HGB 13.7 13.4  HCT 40.0 38.4*  MCV 90.3 90.6  MCH 30.9 31.6  MCHC 34.3 34.9  RDW 11.4* 11.9  PLT 335 265   Thyroid No results for input(s): "TSH", "FREET4" in the last 168 hours.  BNP Recent Labs  Lab 03/16/22 1945  BNP 57.1    DDimer  Recent Labs  Lab 03/17/22 0932  DDIMER 0.40     Radiology    VAS [place] LOWER EXTREMITY VENOUS (DVT)  Result Date: 03/19/2022  Lower Venous DVT Study Patient Name:  Leroy Simmons  Date of Exam:   03/18/2022 Medical Rec #: [id number]       Accession #:    [id number] Date of Birth: 1971-08-11       Patient Gender: M Patient Age:   51 years Exam Location:  [facility] Procedure:      VAS [place] LOWER EXTREMITY VENOUS (DVT) Referring Phys: Ezequiel Essex --------------------------------------------------------------------------------  Indications: Edema.  Performing Technologist: Bobetta Lime  Examination  Guidelines: A complete evaluation includes B-mode imaging, spectral Doppler, color Doppler, and power Doppler as needed of all accessible portions of each vessel. Bilateral testing is considered an integral part of a complete examination. Limited examinations for reoccurring indications may be performed as noted. The reflux portion of the exam is performed with the patient in reverse Trendelenburg.  +---------+---------------+---------+-----------+----------+--------------+ RIGHT    CompressibilityPhasicitySpontaneityPropertiesThrombus Aging +---------+---------------+---------+-----------+----------+--------------+ CFV      Full           Yes      Yes                                 +---------+---------------+---------+-----------+----------+--------------+ SFJ      Full                                                        +---------+---------------+---------+-----------+----------+--------------+ FV Prox  Full                                                        +---------+---------------+---------+-----------+----------+--------------+ FV Mid   Full                                                        +---------+---------------+---------+-----------+----------+--------------+ FV DistalFull                                                        +---------+---------------+---------+-----------+----------+--------------+  PFV      Full                                                        +---------+---------------+---------+-----------+----------+--------------+ POP      Full           Yes      Yes                                 +---------+---------------+---------+-----------+----------+--------------+ PTV      Full                                                        +---------+---------------+---------+-----------+----------+--------------+ PERO     Full                                                         +---------+---------------+---------+-----------+----------+--------------+   +---------+---------------+---------+-----------+----------+--------------+ LEFT     CompressibilityPhasicitySpontaneityPropertiesThrombus Aging +---------+---------------+---------+-----------+----------+--------------+ CFV      Full           Yes      Yes                                 +---------+---------------+---------+-----------+----------+--------------+ SFJ      Full                                                        +---------+---------------+---------+-----------+----------+--------------+ FV Prox  Full                                                        +---------+---------------+---------+-----------+----------+--------------+ FV Mid   Full                                                        +---------+---------------+---------+-----------+----------+--------------+ FV DistalFull                                                        +---------+---------------+---------+-----------+----------+--------------+ PFV      Full                                                        +---------+---------------+---------+-----------+----------+--------------+  POP      Full           Yes      Yes                                 +---------+---------------+---------+-----------+----------+--------------+ PTV      Full                                                        +---------+---------------+---------+-----------+----------+--------------+ PERO     Full                                                        +---------+---------------+---------+-----------+----------+--------------+     Summary: BILATERAL: - No evidence of deep vein thrombosis seen in the lower extremities, bilaterally. -No evidence of popliteal cyst, bilaterally.   *See table(s) above for measurements and observations. Electronically signed by Jamelle Haring on 03/19/2022 at 12:11:58 PM.     Final    CT CORONARY FRACTIONAL FLOW RESERVE DATA PREP  Result Date: 03/18/2022 EXAM: CT FFR ANALYSIS CLINICAL DATA:  angina FINDINGS: FFRct analysis was performed on the original cardiac CT angiogram dataset. Diagrammatic representation of the FFRct analysis is provided in a separate PDF document in PACS. This dictation was created using the PDF document and an interactive 3D model of the results. 3D model is not available in the EMR/PACS. Normal FFR range is >0.80. 1. Left Main:  No significant stenosis. FFR = 1.00 2. LAD: Significant stenosis. Proximal FFR = 0.97, Mid FFR = 0.76, Distal FFR = 0.61 3. LCX: No significant stenosis. Proximal FFR = 0.94, Distal FFR = 0.81 4. RCA: No significant stenosis. Proximal FFR =0.99, Mid FFR = 0.93, Distal FFR = 0.87 IMPRESSION: 1.  CT FFR does show significant proximal to mid LAD stenosis. 2.  Definitive cardiac catheterization is recommended. Electronically Signed   By: Pixie Casino M.D.   On: 03/18/2022 13:55   CT CORONARY MORPH W/CTA COR W/SCORE W/CA W/CM &/OR WO/CM  Addendum Date: 03/18/2022   ADDENDUM REPORT: 03/18/2022 11:35 HISTORY: 51 yo male with chest pain/anginal equiv, intermediate CAD risk, not treadmill candidate EXAM: Cardiac/Coronary CTA TECHNIQUE: The patient was scanned on a [employer]. PROTOCOL: A 120 kV prospective scan was triggered in the descending thoracic aorta at 111 HU's. Axial non-contrast 3 mm slices were carried out through the heart. The data set was analyzed on a dedicated work station and scored using the [place]. Gantry rotation speed was 250 msecs and collimation was .6 mm. Beta blockade and 0.8 mg of sl NTG was given. The 3D data set was reconstructed in 5% intervals of the 35-75 % of the R-R cycle. Diastolic phases were analyzed on a dedicated work station using MPR, MIP and VRT modes. The patient received 50mL OMNIPAQUE IOHEXOL 350 MG/ML SOLN of contrast. FINDINGS: Quality: Good, HR 68 Coronary calcium  score: The patient's coronary artery calcium score is 431, which places the patient in the 98th percentile. Coronary arteries: Normal coronary origins.  Right dominance. Right Coronary Artery: Dominant. Mild mixed proximal 25-49% stenosis (CADRADS1). There is plaque  at the crux with positive remodeling and spotty calcification. There is minimal 1-24% mixed distal vessel stenosis. Left Main Coronary Artery: Normal. Bifurcates into the LAD and LCx arteries. Left Anterior Descending Coronary Artery: Anterior artery which wraps around the apex. There is eccentric ostial calcification and spotty proximal calcification with associated low attenuation plaque and 70-99% stenoisis (CADRADS4a) - the LAD measures 1.02 mm at its smallest point. The proximal segment of atherosclerosis extends 50 mm from the LAD/LCX bifurcation. D1 branch has minimal mixed proximal disease. Left Circumflex Artery: Lateral wall vessel. There is a mild 25-49% non-calcified stenosis of the proximal vessel (CADRADS2). Aorta: Normal size, 32 mm at the mid ascending aorta (level of the PA bifurcation) measured double oblique. No calcifications. No dissection. Aortic Valve: Trileaflet. No calcifications. Other findings: Normal pulmonary vein drainage into the left atrium. Normal left atrial appendage without a thrombus. Normal size of the pulmonary artery. IMPRESSION: 1. 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 CT FFR will be performed and reported separately. 2. Coronary calcium score of 431. This was 98th percentile for age and sex matched control. 3. Normal coronary origin with right dominance. 4. Definitive cardiac catheterization is recommended. Electronically Signed   By: Pixie Casino M.D.   On: 03/18/2022 11:35   Result Date: 03/18/2022 EXAM: OVER-READ INTERPRETATION  CT CHEST The following report is a limited chest CT over-read performed by radiologist Dr. Vinnie Langton of [facility],  [place] on 03/18/2022. The coronary calcium score and cardiac CTA interpretation by the cardiologist is attached. COMPARISON:  None Available. FINDINGS: Small left lower lobe pulmonary nodule measuring 8 mm abutting the left hemidiaphragm (axial image 42 of series 11). Within the visualized portions of the thorax there is no acute consolidative airspace disease, no pleural effusions, no pneumothorax and no lymphadenopathy. Visualized portions of the upper abdomen are unremarkable. There are no aggressive appearing lytic or blastic lesions noted in the visualized portions of the skeleton. IMPRESSION: 1. 8 mm left lower lobe pulmonary nodule. Non-contrast chest CT at 6-12 months is recommended. If the nodule is stable at time of repeat CT, then future CT at 18-24 months (from today's scan) is considered optional for low-risk patients, but is recommended for high-risk patients. This recommendation follows the consensus statement: Guidelines for Management of Incidental Pulmonary Nodules Detected on CT Images: From the Fleischner Society 2017; Radiology 2017; 284:228-243. Electronically Signed: By: Vinnie Langton M.D. On: 03/18/2022 11:03   US RENAL  Result Date: 03/17/2022 CLINICAL DATA:  Acute kidney injury. EXAM: RENAL / URINARY TRACT ULTRASOUND COMPLETE COMPARISON:  None Available. FINDINGS: Right Kidney: Renal measurements: 12 x 5.7 x 5.6 cm = volume: 200 mL. Borderline increased parenchymal echogenicity. No hydronephrosis. No visualized stone or focal lesion. Left Kidney: Renal measurements: 11.6 x 5.9 x 5.1 cm = volume: 181 mL. Borderline increased parenchymal echogenicity. No hydronephrosis. No visualized stone or focal lesion. Bladder: Appears normal for degree of bladder distention. Other: None. IMPRESSION: 1. No obstructive uropathy.  No focal renal abnormality or stone. 2. Borderline increased renal parenchymal echogenicity can be seen in the setting of chronic medical renal disease. Electronically Signed    By: Keith Rake M.D.   On: 03/17/2022 18:02   ECHOCARDIOGRAM COMPLETE  Result Date: 03/17/2022    ECHOCARDIOGRAM REPORT   Patient Name:   Leroy Simmons Date of Exam: 03/17/2022 Medical Rec #:  [id number]      Height:       68.0 in Accession #:  [id number]     Weight:       216.0 lb Date of Birth:  April 30, 1971      BSA:          2.112 m Patient Age:    89 years       BP:           124/75 mmHg Patient Gender: M              HR:           79 bpm. Exam Location:  Inpatient Procedure: 2D Echo, Cardiac Doppler and Color Doppler Indications:    CHF  History:        Patient has no prior history of Echocardiogram examinations.                 Risk Factors:Hypertension and Diabetes.  Sonographer:    Jyl Heinz Referring Phys: [place]  1. Left ventricular ejection fraction, by estimation, is 55 to 60%. The left ventricle has normal function. The left ventricle has no regional wall motion abnormalities. Left ventricular diastolic parameters are indeterminate.  2. Right ventricular systolic function is normal. The right ventricular size is normal. Tricuspid regurgitation signal is inadequate for assessing PA pressure.  3. The mitral valve is normal in structure. Trivial mitral valve regurgitation. No evidence of mitral stenosis.  4. The aortic valve has an indeterminant number of cusps. Aortic valve regurgitation is mild. No aortic stenosis is present.  5. The inferior vena cava is normal in size with greater than 50% respiratory variability, suggesting right atrial pressure of 3 mmHg. Comparison(s): No prior Echocardiogram. FINDINGS  Left Ventricle: Left ventricular ejection fraction, by estimation, is 55 to 60%. The left ventricle has normal function. The left ventricle has no regional wall motion abnormalities. The left ventricular internal cavity size was normal in size. There is  no left ventricular hypertrophy. Left ventricular diastolic parameters are indeterminate. Right Ventricle: The  right ventricular size is normal. No increase in right ventricular wall thickness. Right ventricular systolic function is normal. Tricuspid regurgitation signal is inadequate for assessing PA pressure. Left Atrium: Left atrial size was normal in size. Right Atrium: Right atrial size was normal in size. Pericardium: There is no evidence of pericardial effusion. Mitral Valve: The mitral valve is normal in structure. Trivial mitral valve regurgitation. No evidence of mitral valve stenosis. Tricuspid Valve: The tricuspid valve is normal in structure. Tricuspid valve regurgitation is not demonstrated. No evidence of tricuspid stenosis. Aortic Valve: The aortic valve has an indeterminant number of cusps. Aortic valve regurgitation is mild. No aortic stenosis is present. Aortic valve peak gradient measures 3.6 mmHg. Pulmonic Valve: The pulmonic valve was normal in structure. Pulmonic valve regurgitation is not visualized. No evidence of pulmonic stenosis. Aorta: The aortic root is normal in size and structure. Venous: The inferior vena cava is normal in size with greater than 50% respiratory variability, suggesting right atrial pressure of 3 mmHg. IAS/Shunts: No atrial level shunt detected by color flow Doppler.  LEFT VENTRICLE PLAX 2D LVIDd:         4.40 cm      Diastology LVIDs:         3.00 cm      LV e' medial:    5.40 cm/s LV PW:         1.40 cm      LV E/e' medial:  17.2 LV IVS:        1.30 cm  LV e' lateral:   5.70 cm/s LVOT diam:     2.00 cm      LV E/e' lateral: 16.3 LV SV:         55 LV SV Index:   26 LVOT Area:     3.14 cm  LV Volumes (MOD) LV vol d, MOD A2C: 110.0 ml LV vol d, MOD A4C: 117.0 ml LV vol s, MOD A2C: 47.3 ml LV vol s, MOD A4C: 48.5 ml LV SV MOD A2C:     62.7 ml LV SV MOD A4C:     117.0 ml LV SV MOD BP:      65.9 ml RIGHT VENTRICLE             IVC RV Basal diam:  2.90 cm     IVC diam: 1.80 cm RV Mid diam:    2.70 cm RV S prime:     11.30 cm/s TAPSE (M-mode): 1.8 cm LEFT ATRIUM             Index         RIGHT ATRIUM           Index LA diam:        4.20 cm 1.99 cm/m   RA Area:     12.70 cm LA Vol (A2C):   42.9 ml 20.32 ml/m  RA Volume:   24.40 ml  11.55 ml/m LA Vol (A4C):   48.4 ml 22.92 ml/m LA Biplane Vol: 45.6 ml 21.59 ml/m  AORTIC VALVE AV Area (Vmax): 2.96 cm AV Vmax:        94.70 cm/s AV Peak Grad:   3.6 mmHg LVOT Vmax:      89.20 cm/s LVOT Vmean:     64.600 cm/s LVOT VTI:       0.175 m  AORTA Ao Root diam: 3.00 cm Ao Asc diam:  3.20 cm MITRAL VALVE MV Area (PHT): 3.53 cm    SHUNTS MV Decel Time: 215 msec    Systemic VTI:  0.18 m MV E velocity: 92.90 cm/s  Systemic Diam: 2.00 cm MV A velocity: 80.90 cm/s MV E/A ratio:  1.15 Kardie Tobb DO Electronically signed by Thomasene Ripple DO Signature Date/Time: 03/17/2022/2:54:04 PM    Final     Cardiac Studies   Echocardiogram 03/17/22  1. Left ventricular ejection fraction, by estimation, is 55 to 60%. The  left ventricle has normal function. The left ventricle has no regional  wall motion abnormalities. Left ventricular diastolic parameters are  indeterminate.   2. Right ventricular systolic function is normal. The right ventricular  size is normal. Tricuspid regurgitation signal is inadequate for assessing  PA pressure.   3. The mitral valve is normal in structure. Trivial mitral valve  regurgitation. No evidence of mitral stenosis.   4. The aortic valve has an indeterminant number of cusps. Aortic valve  regurgitation is mild. No aortic stenosis is present.   5. The inferior vena cava is normal in size with greater than 50%  respiratory variability, suggesting right atrial pressure of 3 mmHg.   Patient Profile     51 y.o. male with a hx of HTN, DM, HLD, ? CHF  in past and anxiety who was seen 03/17/2022 for the evaluation of chest pain and CHF at the request of Dr. Manus Gunning.  Assessment & Plan    Chronic HFpEF  - Patient reported having a history of heart failure, presented with SOB on exertion, leg swelling, and intermittent chest  pain - Echo this  admission showed EF 55-60%, indeterminate diastolic function  - Was taking lasix PTA, did report missing a day or two  - CXR showed no cardiopulmonary disease, BNP within normal limits  - Diuresed another 1.3L overnight, now 5.3L negative.  - Creatinine increased slightly to 1.57 (from 1.48), will hold diuretics today, switch to 40 mg oral daily tomorrow - Home jardiance, lisinopril currently held as patient receives IV diuretics and has decreased renal function   Chest Pain  Troponin Elevation  - hsTn 30>>31>>32>>31. This is a very minimal elevation with a flat trend-- not consistent with ACS, suspect troponin leak in the setting of mild CHF exacerbation  - Patient concerned about waxing and waning chest pain, has apparently been referred to cardiology by PCP but failed to answer phone calls to arrange appointment. Concern that patient will not show up if we tried to scheduled ischemic eval in the outpatient setting  - Did have a normal stress test in 2021 - Personally reviewed coronary CT yesterday, there is signficant flow limiting disease in the LAD. Plan for [facility] on Monday- he was consented yesterday. - Continue ASA 81, crestor 20, amlodipine 5, not on BB - start low dose Toprol XL 12.5 mg daily  HTN  - Continue amlodipine  - Lisinopril held for now as patient is receiving IV diuretics, renal function is decreased  - added Toprol XL 12.5 mg daily  Otherwise per primary  - Poorly controlled Type 2 diabetes mellitus - AKI vs CKD   - Mild cellulitis of the right lower extremity  - Depression/anxiety  - High risk sexual activity   Pixie Casino, MD, [facility], [place] Director of the [facility] Lipid Disorders &  Cardiovascular Risk Reduction Clinic Diplomate of the American Board of Clinical Lipidology Attending Cardiologist  Direct Dial: [phone]  Fax: [phone]  Website:  [email or web address]   Pixie Casino,  MD 03/19/22 12:42 PM

## 2022-03-19 NOTE — Progress Notes (Signed)
Pt cbg not uploading to computer it reads 281 as of 1625.

## 2022-03-19 NOTE — Progress Notes (Signed)
HD#1 Subjective:  Overnight Events: no event   Patient was seen at bedside.  He appears comfortable in no acute distress.  Patient was lying flat without respiratory issue.  States that his lower extremity edema has improved.  He is aware of the CTA coronary result.  Objective:  Vital signs in last 24 hours: Vitals:   03/18/22 2319 03/19/22 0300 03/19/22 0343 03/19/22 0500  BP: 122/71  118/79   Pulse: 65 74 69   Resp: 12 17 13    Temp: 98.2 F (36.8 C)  98 F (36.7 C)   TempSrc: Oral  Oral   SpO2: 98% 91% 98%   Weight:    94.6 kg  Height:       Supplemental O2: Room Air SpO2: 98 %   Physical Exam:  Physical Exam Constitutional:      General: He is not in acute distress.    Appearance: He is not ill-appearing.  HENT:     Head: Normocephalic.  Eyes:     General:        Right eye: No discharge.        Left eye: No discharge.     Conjunctiva/sclera: Conjunctivae normal.  Cardiovascular:     Rate and Rhythm: Normal rate and regular rhythm.     Comments: Positivity but improved compared to yesterday.  +1 lower extremity edema Pulmonary:     Effort: Pulmonary effort is normal. No respiratory distress.     Breath sounds: Normal breath sounds. No wheezing.  Skin:    General: Skin is warm.  Neurological:     General: No focal deficit present.     Mental Status: He is alert.  Psychiatric:        Mood and Affect: Mood normal.     Filed Weights   03/17/22 1954 03/18/22 0411 03/19/22 0500  Weight: 95.3 kg 94.5 kg 94.6 kg     Intake/Output Summary (Last 24 hours) at 03/19/2022 0622 Last data filed at 03/18/2022 2320 Gross per 24 hour  Intake 960 ml  Output 1775 ml  Net -815 ml   Net IO Since Admission: -3,980 mL [03/19/22 0622]  Pertinent Labs:    Latest Ref Rng & Units 03/17/2022    9:32 AM 03/16/2022    7:45 PM 06/26/2021    5:31 PM  CBC  WBC 4.0 - 10.5 K/uL 12.6  19.0  7.5   Hemoglobin 13.0 - 17.0 g/dL 13.4  13.7  14.6   Hematocrit 39.0 - 52.0 % 38.4   40.0  42.2   Platelets 150 - 400 K/uL 265  335  242        Latest Ref Rng & Units 03/19/2022   12:27 AM 03/18/2022    4:34 AM 03/17/2022   12:49 PM  CMP  Glucose 70 - 99 mg/dL 243  235  324   BUN 6 - 20 mg/dL 34  26  25   Creatinine 0.61 - 1.24 mg/dL 1.57  1.48  1.47   Sodium 135 - 145 mmol/L 134  139  136   Potassium 3.5 - 5.1 mmol/L 4.6  3.5  3.2   Chloride 98 - 111 mmol/L 95  99  95   CO2 22 - 32 mmol/L 28  27  29    Calcium 8.9 - 10.3 mg/dL 8.8  8.5  8.7     Imaging: CT CORONARY FRACTIONAL FLOW RESERVE DATA PREP  Result Date: 03/18/2022 EXAM: CT FFR ANALYSIS CLINICAL DATA:  angina FINDINGS: FFRct analysis  was performed on the original cardiac CT angiogram dataset. Diagrammatic representation of the FFRct analysis is provided in a separate PDF document in PACS. This dictation was created using the PDF document and an interactive 3D model of the results. 3D model is not available in the EMR/PACS. Normal FFR range is >0.80. 1. Left Main:  No significant stenosis. FFR = 1.00 2. LAD: Significant stenosis. Proximal FFR = 0.97, Mid FFR = 0.76, Distal FFR = 0.61 3. LCX: No significant stenosis. Proximal FFR = 0.94, Distal FFR = 0.81 4. RCA: No significant stenosis. Proximal FFR =0.99, Mid FFR = 0.93, Distal FFR = 0.87 IMPRESSION: 1.  CT FFR does show significant proximal to mid LAD stenosis. 2.  Definitive cardiac catheterization is recommended. Electronically Signed   By: Pixie Casino M.D.   On: 03/18/2022 13:55   VAS [place] LOWER EXTREMITY VENOUS (DVT)  Result Date: 03/18/2022  Lower Venous DVT Study Patient Name:  ELL BAMBERGER  Date of Exam:   03/18/2022 Medical Rec #: [id number]       Accession #:    [id number] Date of Birth: [birth year]       Patient Gender: M Patient Age:   51 years Exam Location:  [facility] [place] Procedure:      VAS [place] LOWER EXTREMITY VENOUS (DVT) Referring Phys: Ezequiel Essex --------------------------------------------------------------------------------  Indications:  Edema.  Performing Technologist: Bobetta Lime  Examination Guidelines: A complete evaluation includes B-mode imaging, spectral Doppler, color Doppler, and power Doppler as needed of all accessible portions of each vessel. Bilateral testing is considered an integral part of a complete examination. Limited examinations for reoccurring indications may be performed as noted. The reflux portion of the exam is performed with the patient in reverse Trendelenburg.  +---------+---------------+---------+-----------+----------+--------------+ RIGHT    CompressibilityPhasicitySpontaneityPropertiesThrombus Aging +---------+---------------+---------+-----------+----------+--------------+ CFV      Full           Yes      Yes                                 +---------+---------------+---------+-----------+----------+--------------+ SFJ      Full                                                        +---------+---------------+---------+-----------+----------+--------------+ FV Prox  Full                                                        +---------+---------------+---------+-----------+----------+--------------+ FV Mid   Full                                                        +---------+---------------+---------+-----------+----------+--------------+ FV DistalFull                                                        +---------+---------------+---------+-----------+----------+--------------+  PFV      Full                                                        +---------+---------------+---------+-----------+----------+--------------+ POP      Full           Yes      Yes                                 +---------+---------------+---------+-----------+----------+--------------+ PTV      Full                                                        +---------+---------------+---------+-----------+----------+--------------+ PERO     Full                                                         +---------+---------------+---------+-----------+----------+--------------+   +---------+---------------+---------+-----------+----------+--------------+ LEFT     CompressibilityPhasicitySpontaneityPropertiesThrombus Aging +---------+---------------+---------+-----------+----------+--------------+ CFV      Full           Yes      Yes                                 +---------+---------------+---------+-----------+----------+--------------+ SFJ      Full                                                        +---------+---------------+---------+-----------+----------+--------------+ FV Prox  Full                                                        +---------+---------------+---------+-----------+----------+--------------+ FV Mid   Full                                                        +---------+---------------+---------+-----------+----------+--------------+ FV DistalFull                                                        +---------+---------------+---------+-----------+----------+--------------+ PFV      Full                                                        +---------+---------------+---------+-----------+----------+--------------+  POP      Full           Yes      Yes                                 +---------+---------------+---------+-----------+----------+--------------+ PTV      Full                                                        +---------+---------------+---------+-----------+----------+--------------+ PERO     Full                                                        +---------+---------------+---------+-----------+----------+--------------+    Summary: BILATERAL: - No evidence of deep vein thrombosis seen in the lower extremities, bilaterally. -No evidence of popliteal cyst, bilaterally.   *See table(s) above for measurements and observations.    Preliminary    CT CORONARY  MORPH W/CTA COR W/SCORE W/CA W/CM &/OR WO/CM  Addendum Date: 03/18/2022   ADDENDUM REPORT: 03/18/2022 11:35 HISTORY: 51 yo male with chest pain/anginal equiv, intermediate CAD risk, not treadmill candidate EXAM: Cardiac/Coronary CTA TECHNIQUE: The patient was scanned on a [employer]. PROTOCOL: A 120 kV prospective scan was triggered in the descending thoracic aorta at 111 HU's. Axial non-contrast 3 mm slices were carried out through the heart. The data set was analyzed on a dedicated work station and scored using the Agatson method. Gantry rotation speed was 250 msecs and collimation was .6 mm. Beta blockade and 0.8 mg of sl NTG was given. The 3D data set was reconstructed in 5% intervals of the 35-75 % of the R-R cycle. Diastolic phases were analyzed on a dedicated work station using MPR, MIP and VRT modes. The patient received 61mL OMNIPAQUE IOHEXOL 350 MG/ML SOLN of contrast. FINDINGS: Quality: Good, HR 68 Coronary calcium score: The patient's coronary artery calcium score is 431, which places the patient in the 98th percentile. Coronary arteries: Normal coronary origins.  Right dominance. Right Coronary Artery: Dominant. Mild mixed proximal 25-49% stenosis (CADRADS1). There is plaque at the crux with positive remodeling and spotty calcification. There is minimal 1-24% mixed distal vessel stenosis. Left Main Coronary Artery: Normal. Bifurcates into the LAD and LCx arteries. Left Anterior Descending Coronary Artery: Anterior artery which wraps around the apex. There is eccentric ostial calcification and spotty proximal calcification with associated low attenuation plaque and 70-99% stenoisis (CADRADS4a) - the LAD measures 1.02 mm at its smallest point. The proximal segment of atherosclerosis extends 50 mm from the LAD/LCX bifurcation. D1 branch has minimal mixed proximal disease. Left Circumflex Artery: Lateral wall vessel. There is a mild 25-49% non-calcified stenosis of the proximal vessel  (CADRADS2). Aorta: Normal size, 32 mm at the mid ascending aorta (level of the PA bifurcation) measured double oblique. No calcifications. No dissection. Aortic Valve: Trileaflet. No calcifications. Other findings: Normal pulmonary vein drainage into the left atrium. Normal left atrial appendage without a thrombus. Normal size of the pulmonary artery. IMPRESSION: 1. 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 CT FFR will be performed and reported  separately. 2. Coronary calcium score of 431. This was 98th percentile for age and sex matched control. 3. Normal coronary origin with right dominance. 4. Definitive cardiac catheterization is recommended. Electronically Signed   By: Chrystie Nose M.D.   On: 03/18/2022 11:35   Result Date: 03/18/2022 EXAM: OVER-READ INTERPRETATION  CT CHEST The following report is a limited chest CT over-read performed by radiologist Dr. Trudie Reed of [facility] on 03/18/2022. The coronary calcium score and cardiac CTA interpretation by the cardiologist is attached. COMPARISON:  None Available. FINDINGS: Small left lower lobe pulmonary nodule measuring 8 mm abutting the left hemidiaphragm (axial image 42 of series 11). Within the visualized portions of the thorax there is no acute consolidative airspace disease, no pleural effusions, no pneumothorax and no lymphadenopathy. Visualized portions of the upper abdomen are unremarkable. There are no aggressive appearing lytic or blastic lesions noted in the visualized portions of the skeleton. IMPRESSION: 1. 8 mm left lower lobe pulmonary nodule. Non-contrast chest CT at 6-12 months is recommended. If the nodule is stable at time of repeat CT, then future CT at 18-24 months (from today's scan) is considered optional for low-risk patients, but is recommended for high-risk patients. This recommendation follows the consensus statement: Guidelines for Management of Incidental Pulmonary  Nodules Detected on CT Images: From the Fleischner Society 2017; Radiology 2017; 284:228-243. Electronically Signed: By: Trudie Reed M.D. On: 03/18/2022 11:03    Assessment/Plan:   Principal Problem:   CHF exacerbation (HCC) Active Problems:   Essential hypertension   Diabetes mellitus (HCC)   Cellulitis of right lower extremity   AKI (acute kidney injury) (HCC)   Coronary atherosclerosis of native coronary artery   Acute exacerbation of CHF (congestive heart failure) (HCC)   Patient Summary: Leroy Simmons is a 51 y.o. with a pertinent PMH of HFpEF, type 2 diabetes, hypertension, who presented with acute chest tightness and admitted for acute decompensated HFpEF exacerbation.    CAD with severe stenosis of proximal LAD Chest pain CTA coronary shows severe CAD in the proximal LAD.  Cardiology plan for left heart cath on Monday.   -Appreciate cardiology recommendation -LDL 81.  Switch simvastatin to Crestor 20 mg -Cont aspirin 81 mg -His diabetes also need better control   Acute on Chronic HFpEF exacerbation Has had net 0.8 L out overnight.  His weight is unchanged.  Creatinine bumped from 1.48 - 1.57 suggest we are at his dry weight. -Switch to p.o. Lasix 40 mg tonight -Continue Jardiance   AKI vs CKD Unknown baseline creatinine but evidence of kidney disease on ultrasound.  Bump in creatinine today likely due to overdiuresis. -Switch to p.o. Lasix -BMP in a.m. -Holding lisinopril   Right lower extremity cellulitis DVT has been ruled out.   -Continue Keflex (3/5)   Uncontrolled type 2 diabetes  A1c of 9.8.   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 TOC recs:    Dispo: Anticipated discharge to Home in 2-3 days pending left heart cath.   Doran Stabler, DO 03/19/2022, 6:22 AM Pager: [phone]  Please contact the on call pager after 5 pm and on weekends at [phone].

## 2022-03-19 NOTE — Progress Notes (Signed)
Pt CBG not uploading reading 217 at 1146.

## 2022-03-19 NOTE — Progress Notes (Signed)
Progress Note  Patient Name: Leroy Simmons Date of Encounter: 03/19/2022  [facility] Cardiologist: Elouise Munroe, MD   Subjective   Shortness of breath improved no chest pain overnight. Inpatient Medications    Scheduled Meds:  amLODipine  5 mg Oral Daily   aspirin  81 mg Oral Daily   busPIRone  7.5 mg Oral BID   cephALEXin  500 mg Oral Q12H   empagliflozin  10 mg Oral q morning   emtricitabine-tenofovir AF  1 tablet Oral Daily   enoxaparin (LOVENOX) injection  40 mg Subcutaneous Daily   furosemide  40 mg Oral BID   insulin aspart  0-20 Units Subcutaneous TID WC   insulin glargine-yfgn  16 Units Subcutaneous Daily   living well with diabetes book   Does not apply Once   rosuvastatin  20 mg Oral Daily   sertraline  25 mg Oral Daily   sodium chloride flush  3 mL Intravenous Q12H   Continuous Infusions:  PRN Meds: acetaminophen, ondansetron (ZOFRAN) IV, mouth rinse   Vital Signs    Vitals:   03/19/22 0500 03/19/22 0814 03/19/22 0900 03/19/22 1148  BP:   130/86 140/90  Pulse:   75 76  Resp:   14 18  Temp:  97.6 F (36.4 C)  97.9 F (36.6 C)  TempSrc:  Oral  Oral  SpO2:   96% 98%  Weight: 94.6 kg     Height:        Intake/Output Summary (Last 24 hours) at 03/19/2022 1239 Last data filed at 03/19/2022 1230 Gross per 24 hour  Intake 480 ml  Output 2600 ml  Net -2120 ml       03/19/2022    5:00 AM 03/18/2022    4:11 AM 03/17/2022    7:54 PM  Last 3 Weights  Weight (lbs) 208 lb 8.9 oz 208 lb 5.4 oz 210 lb 1.6 oz  Weight (kg) 94.6 kg 94.5 kg 95.3 kg      Telemetry    Normal sinus rhythm- Personally Reviewed  ECG    No new EKG to review- Personally Reviewed  Physical Exam   GEN: Well nourished, well developed in no acute distress HEENT: Normal NECK: No JVD; No carotid bruits LYMPHATICS: No lymphadenopathy CARDIAC:RRR, no murmurs, rubs, gallops RESPIRATORY:  Clear to auscultation without rales, wheezing or rhonchi  ABDOMEN: Soft,  non-tender, non-distended MUSCULOSKELETAL:  No edema; No deformity  SKIN: Warm and dry NEUROLOGIC:  Alert and oriented x 3 PSYCHIATRIC:  Normal affect sorry    High Sensitivity Troponin:   Recent Labs  Lab 03/16/22 1945 03/16/22 2243 03/17/22 0932 03/17/22 1249  TROPONINIHS 30* 31* 32* 31*      Chemistry Recent Labs  Lab 03/16/22 1945 03/17/22 1249 03/18/22 0434 03/19/22 0027  NA 136 136 139 134*  K 3.3* 3.2* 3.5 4.6  CL 97* 95* 99 95*  CO2 25 29 27 28   GLUCOSE 366* 324* 235* 243*  BUN 25* 25* 26* 34*  CREATININE 1.54* 1.47* 1.48* 1.57*  CALCIUM 9.3 8.7* 8.5* 8.8*  MG  --   --   --  2.4  PROT 7.4  --   --   --   ALBUMIN 3.5  --   --   --   AST 15  --   --   --   ALT 19  --   --   --   ALKPHOS 103  --   --   --   BILITOT 1.2  --   --   --  GFRNONAA 55* 58* 58* 54*  ANIONGAP 14 12 13 11      Lipids  Recent Labs  Lab 03/18/22 0434  CHOL 152  TRIG 153*  HDL 40*  LDLCALC 81  CHOLHDL 3.8     Hematology Recent Labs  Lab 03/16/22 1945 03/17/22 0932  WBC 19.0* 12.6*  RBC 4.43 4.24  HGB 13.7 13.4  HCT 40.0 38.4*  MCV 90.3 90.6  MCH 30.9 31.6  MCHC 34.3 34.9  RDW 11.4* 11.9  PLT 335 265    Thyroid No results for input(s): "TSH", "FREET4" in the last 168 hours.  BNP Recent Labs  Lab 03/16/22 1945  BNP 57.1     DDimer  Recent Labs  Lab 03/17/22 0932  DDIMER 0.40      Radiology    VAS [place] LOWER EXTREMITY VENOUS (DVT)  Result Date: 03/19/2022  Lower Venous DVT Study Patient Name:  Leroy Simmons  Date of Exam:   03/18/2022 Medical Rec #: [id number]       Accession #:    [id number] Date of Birth: [birth year]       Patient Gender: M Patient Age:   4 years Exam Location:  [facility] Procedure:      VAS [place] LOWER EXTREMITY VENOUS (DVT) Referring Phys: Ezequiel Essex --------------------------------------------------------------------------------  Indications: Edema.  Performing Technologist: Bobetta Lime  Examination Guidelines: A complete  evaluation includes B-mode imaging, spectral Doppler, color Doppler, and power Doppler as needed of all accessible portions of each vessel. Bilateral testing is considered an integral part of a complete examination. Limited examinations for reoccurring indications may be performed as noted. The reflux portion of the exam is performed with the patient in reverse Trendelenburg.  +---------+---------------+---------+-----------+----------+--------------+ RIGHT    CompressibilityPhasicitySpontaneityPropertiesThrombus Aging +---------+---------------+---------+-----------+----------+--------------+ CFV      Full           Yes      Yes                                 +---------+---------------+---------+-----------+----------+--------------+ SFJ      Full                                                        +---------+---------------+---------+-----------+----------+--------------+ FV Prox  Full                                                        +---------+---------------+---------+-----------+----------+--------------+ FV Mid   Full                                                        +---------+---------------+---------+-----------+----------+--------------+ FV DistalFull                                                        +---------+---------------+---------+-----------+----------+--------------+  PFV      Full                                                        +---------+---------------+---------+-----------+----------+--------------+ POP      Full           Yes      Yes                                 +---------+---------------+---------+-----------+----------+--------------+ PTV      Full                                                        +---------+---------------+---------+-----------+----------+--------------+ PERO     Full                                                         +---------+---------------+---------+-----------+----------+--------------+   +---------+---------------+---------+-----------+----------+--------------+ LEFT     CompressibilityPhasicitySpontaneityPropertiesThrombus Aging +---------+---------------+---------+-----------+----------+--------------+ CFV      Full           Yes      Yes                                 +---------+---------------+---------+-----------+----------+--------------+ SFJ      Full                                                        +---------+---------------+---------+-----------+----------+--------------+ FV Prox  Full                                                        +---------+---------------+---------+-----------+----------+--------------+ FV Mid   Full                                                        +---------+---------------+---------+-----------+----------+--------------+ FV DistalFull                                                        +---------+---------------+---------+-----------+----------+--------------+ PFV      Full                                                        +---------+---------------+---------+-----------+----------+--------------+  POP      Full           Yes      Yes                                 +---------+---------------+---------+-----------+----------+--------------+ PTV      Full                                                        +---------+---------------+---------+-----------+----------+--------------+ PERO     Full                                                        +---------+---------------+---------+-----------+----------+--------------+     Summary: BILATERAL: - No evidence of deep vein thrombosis seen in the lower extremities, bilaterally. -No evidence of popliteal cyst, bilaterally.   *See table(s) above for measurements and observations. Electronically signed by Jamelle Haring on 03/19/2022 at 12:11:58 PM.     Final    CT CORONARY FRACTIONAL FLOW RESERVE DATA PREP  Result Date: 03/18/2022 EXAM: CT FFR ANALYSIS CLINICAL DATA:  angina FINDINGS: FFRct analysis was performed on the original cardiac CT angiogram dataset. Diagrammatic representation of the FFRct analysis is provided in a separate PDF document in PACS. This dictation was created using the PDF document and an interactive 3D model of the results. 3D model is not available in the EMR/PACS. Normal FFR range is >0.80. 1. Left Main:  No significant stenosis. FFR = 1.00 2. LAD: Significant stenosis. Proximal FFR = 0.97, Mid FFR = 0.76, Distal FFR = 0.61 3. LCX: No significant stenosis. Proximal FFR = 0.94, Distal FFR = 0.81 4. RCA: No significant stenosis. Proximal FFR =0.99, Mid FFR = 0.93, Distal FFR = 0.87 IMPRESSION: 1.  CT FFR does show significant proximal to mid LAD stenosis. 2.  Definitive cardiac catheterization is recommended. Electronically Signed   By: Pixie Casino M.D.   On: 03/18/2022 13:55   CT CORONARY MORPH W/CTA COR W/SCORE W/CA W/CM &/OR WO/CM  Addendum Date: 03/18/2022   ADDENDUM REPORT: 03/18/2022 11:35 HISTORY: 51 yo male with chest pain/anginal equiv, intermediate CAD risk, not treadmill candidate EXAM: Cardiac/Coronary CTA TECHNIQUE: The patient was scanned on a [employer]. PROTOCOL: A 120 kV prospective scan was triggered in the descending thoracic aorta at 111 HU's. Axial non-contrast 3 mm slices were carried out through the heart. The data set was analyzed on a dedicated work station and scored using the [place]. Gantry rotation speed was 250 msecs and collimation was .6 mm. Beta blockade and 0.8 mg of sl NTG was given. The 3D data set was reconstructed in 5% intervals of the 35-75 % of the R-R cycle. Diastolic phases were analyzed on a dedicated work station using MPR, MIP and VRT modes. The patient received 43mL OMNIPAQUE IOHEXOL 350 MG/ML SOLN of contrast. FINDINGS: Quality: Good, HR 68 Coronary calcium  score: The patient's coronary artery calcium score is 431, which places the patient in the 98th percentile. Coronary arteries: Normal coronary origins.  Right dominance. Right Coronary Artery: Dominant. Mild mixed proximal 25-49% stenosis (CADRADS1). There is plaque  at the crux with positive remodeling and spotty calcification. There is minimal 1-24% mixed distal vessel stenosis. Left Main Coronary Artery: Normal. Bifurcates into the LAD and LCx arteries. Left Anterior Descending Coronary Artery: Anterior artery which wraps around the apex. There is eccentric ostial calcification and spotty proximal calcification with associated low attenuation plaque and 70-99% stenoisis (CADRADS4a) - the LAD measures 1.02 mm at its smallest point. The proximal segment of atherosclerosis extends 50 mm from the LAD/LCX bifurcation. D1 branch has minimal mixed proximal disease. Left Circumflex Artery: Lateral wall vessel. There is a mild 25-49% non-calcified stenosis of the proximal vessel (CADRADS2). Aorta: Normal size, 32 mm at the mid ascending aorta (level of the PA bifurcation) measured double oblique. No calcifications. No dissection. Aortic Valve: Trileaflet. No calcifications. Other findings: Normal pulmonary vein drainage into the left atrium. Normal left atrial appendage without a thrombus. Normal size of the pulmonary artery. IMPRESSION: 1. 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 CT FFR will be performed and reported separately. 2. Coronary calcium score of 431. This was 98th percentile for age and sex matched control. 3. Normal coronary origin with right dominance. 4. Definitive cardiac catheterization is recommended. Electronically Signed   By: Pixie Casino M.D.   On: 03/18/2022 11:35   Result Date: 03/18/2022 EXAM: OVER-READ INTERPRETATION  CT CHEST The following report is a limited chest CT over-read performed by radiologist Dr. Vinnie Langton of [facility],  [place] on 03/18/2022. The coronary calcium score and cardiac CTA interpretation by the cardiologist is attached. COMPARISON:  None Available. FINDINGS: Small left lower lobe pulmonary nodule measuring 8 mm abutting the left hemidiaphragm (axial image 42 of series 11). Within the visualized portions of the thorax there is no acute consolidative airspace disease, no pleural effusions, no pneumothorax and no lymphadenopathy. Visualized portions of the upper abdomen are unremarkable. There are no aggressive appearing lytic or blastic lesions noted in the visualized portions of the skeleton. IMPRESSION: 1. 8 mm left lower lobe pulmonary nodule. Non-contrast chest CT at 6-12 months is recommended. If the nodule is stable at time of repeat CT, then future CT at 18-24 months (from today's scan) is considered optional for low-risk patients, but is recommended for high-risk patients. This recommendation follows the consensus statement: Guidelines for Management of Incidental Pulmonary Nodules Detected on CT Images: From the Fleischner Society 2017; Radiology 2017; 284:228-243. Electronically Signed: By: Vinnie Langton M.D. On: 03/18/2022 11:03   US RENAL  Result Date: 03/17/2022 CLINICAL DATA:  Acute kidney injury. EXAM: RENAL / URINARY TRACT ULTRASOUND COMPLETE COMPARISON:  None Available. FINDINGS: Right Kidney: Renal measurements: 12 x 5.7 x 5.6 cm = volume: 200 mL. Borderline increased parenchymal echogenicity. No hydronephrosis. No visualized stone or focal lesion. Left Kidney: Renal measurements: 11.6 x 5.9 x 5.1 cm = volume: 181 mL. Borderline increased parenchymal echogenicity. No hydronephrosis. No visualized stone or focal lesion. Bladder: Appears normal for degree of bladder distention. Other: None. IMPRESSION: 1. No obstructive uropathy.  No focal renal abnormality or stone. 2. Borderline increased renal parenchymal echogenicity can be seen in the setting of chronic medical renal disease. Electronically Signed    By: Keith Rake M.D.   On: 03/17/2022 18:02   ECHOCARDIOGRAM COMPLETE  Result Date: 03/17/2022    ECHOCARDIOGRAM REPORT   Patient Name:   Leroy Simmons Date of Exam: 03/17/2022 Medical Rec #:  [id number]      Height:       68.0 in Accession #:  [id number]     Weight:       216.0 lb Date of Birth:  1971-01-04      BSA:          2.112 m Patient Age:    33 years       BP:           124/75 mmHg Patient Gender: M              HR:           79 bpm. Exam Location:  Inpatient Procedure: 2D Echo, Cardiac Doppler and Color Doppler Indications:    CHF  History:        Patient has no prior history of Echocardiogram examinations.                 Risk Factors:Hypertension and Diabetes.  Sonographer:    Jyl Heinz Referring Phys: [place]  1. Left ventricular ejection fraction, by estimation, is 55 to 60%. The left ventricle has normal function. The left ventricle has no regional wall motion abnormalities. Left ventricular diastolic parameters are indeterminate.  2. Right ventricular systolic function is normal. The right ventricular size is normal. Tricuspid regurgitation signal is inadequate for assessing PA pressure.  3. The mitral valve is normal in structure. Trivial mitral valve regurgitation. No evidence of mitral stenosis.  4. The aortic valve has an indeterminant number of cusps. Aortic valve regurgitation is mild. No aortic stenosis is present.  5. The inferior vena cava is normal in size with greater than 50% respiratory variability, suggesting right atrial pressure of 3 mmHg. Comparison(s): No prior Echocardiogram. FINDINGS  Left Ventricle: Left ventricular ejection fraction, by estimation, is 55 to 60%. The left ventricle has normal function. The left ventricle has no regional wall motion abnormalities. The left ventricular internal cavity size was normal in size. There is  no left ventricular hypertrophy. Left ventricular diastolic parameters are indeterminate. Right Ventricle: The  right ventricular size is normal. No increase in right ventricular wall thickness. Right ventricular systolic function is normal. Tricuspid regurgitation signal is inadequate for assessing PA pressure. Left Atrium: Left atrial size was normal in size. Right Atrium: Right atrial size was normal in size. Pericardium: There is no evidence of pericardial effusion. Mitral Valve: The mitral valve is normal in structure. Trivial mitral valve regurgitation. No evidence of mitral valve stenosis. Tricuspid Valve: The tricuspid valve is normal in structure. Tricuspid valve regurgitation is not demonstrated. No evidence of tricuspid stenosis. Aortic Valve: The aortic valve has an indeterminant number of cusps. Aortic valve regurgitation is mild. No aortic stenosis is present. Aortic valve peak gradient measures 3.6 mmHg. Pulmonic Valve: The pulmonic valve was normal in structure. Pulmonic valve regurgitation is not visualized. No evidence of pulmonic stenosis. Aorta: The aortic root is normal in size and structure. Venous: The inferior vena cava is normal in size with greater than 50% respiratory variability, suggesting right atrial pressure of 3 mmHg. IAS/Shunts: No atrial level shunt detected by color flow Doppler.  LEFT VENTRICLE PLAX 2D LVIDd:         4.40 cm      Diastology LVIDs:         3.00 cm      LV e' medial:    5.40 cm/s LV PW:         1.40 cm      LV E/e' medial:  17.2 LV IVS:        1.30 cm  LV e' lateral:   5.70 cm/s LVOT diam:     2.00 cm      LV E/e' lateral: 16.3 LV SV:         55 LV SV Index:   26 LVOT Area:     3.14 cm  LV Volumes (MOD) LV vol d, MOD A2C: 110.0 ml LV vol d, MOD A4C: 117.0 ml LV vol s, MOD A2C: 47.3 ml LV vol s, MOD A4C: 48.5 ml LV SV MOD A2C:     62.7 ml LV SV MOD A4C:     117.0 ml LV SV MOD BP:      65.9 ml RIGHT VENTRICLE             IVC RV Basal diam:  2.90 cm     IVC diam: 1.80 cm RV Mid diam:    2.70 cm RV S prime:     11.30 cm/s TAPSE (M-mode): 1.8 cm LEFT ATRIUM             Index         RIGHT ATRIUM           Index LA diam:        4.20 cm 1.99 cm/m   RA Area:     12.70 cm LA Vol (A2C):   42.9 ml 20.32 ml/m  RA Volume:   24.40 ml  11.55 ml/m LA Vol (A4C):   48.4 ml 22.92 ml/m LA Biplane Vol: 45.6 ml 21.59 ml/m  AORTIC VALVE AV Area (Vmax): 2.96 cm AV Vmax:        94.70 cm/s AV Peak Grad:   3.6 mmHg LVOT Vmax:      89.20 cm/s LVOT Vmean:     64.600 cm/s LVOT VTI:       0.175 m  AORTA Ao Root diam: 3.00 cm Ao Asc diam:  3.20 cm MITRAL VALVE MV Area (PHT): 3.53 cm    SHUNTS MV Decel Time: 215 msec    Systemic VTI:  0.18 m MV E velocity: 92.90 cm/s  Systemic Diam: 2.00 cm MV A velocity: 80.90 cm/s MV E/A ratio:  1.15 Kardie Tobb DO Electronically signed by Thomasene Ripple DO Signature Date/Time: 03/17/2022/2:54:04 PM    Final     Cardiac Studies   Echocardiogram 03/17/22  1. Left ventricular ejection fraction, by estimation, is 55 to 60%. The  left ventricle has normal function. The left ventricle has no regional  wall motion abnormalities. Left ventricular diastolic parameters are  indeterminate.   2. Right ventricular systolic function is normal. The right ventricular  size is normal. Tricuspid regurgitation signal is inadequate for assessing  PA pressure.   3. The mitral valve is normal in structure. Trivial mitral valve  regurgitation. No evidence of mitral stenosis.   4. The aortic valve has an indeterminant number of cusps. Aortic valve  regurgitation is mild. No aortic stenosis is present.   5. The inferior vena cava is normal in size with greater than 50%  respiratory variability, suggesting right atrial pressure of 3 mmHg.   Patient Profile     51 y.o. male with a hx of HTN, DM, HLD, ? CHF  in past and anxiety who was seen 03/17/2022 for the evaluation of chest pain and CHF at the request of Dr. Manus Gunning.  Assessment & Plan    Chronic HFpEF  - Patient reported having a history of heart failure, presented with SOB on exertion, leg swelling, and intermittent chest  pain - Echo this  admission showed EF 55-60%, indeterminate diastolic function  - Was taking lasix PTA, did report missing a day or two  - CXR showed no cardiopulmonary disease, BNP within normal limits  - Has been given 2 doses of IV lasix 40 mg so far--he put out 1.78 L yesterday and is net -5.3 L since admission - Renal function bumped slightly yesterday from 1.48-1.57 today - Home jardiance, lisinopril currently on hold for rising serum creatinine with diuresis - will hold further Lasix over the weekend due to need for cath on Monday>> he has already gotten his a.m. dose  Chest Pain  Troponin Elevation  - hsTn 30>>31>>32>>31. This is a very minimal elevation with a flat trend-- not consistent with ACS, suspect troponin leak in the setting of mild CHF exacerbation  - Patient concerned about waxing and waning chest pain, has apparently been referred to cardiology by PCP but failed to answer phone calls to arrange appointment.  -Coronary CTA yesterday demonstrated coronary calcium score 431 with <25% distal RCA, 25 to 49% mixed proximal RCA stenosis, eccentric ostial LAD ostial LAD calcification and spotty proximal calcification with associated low-attenuation plaque with 70-99% stenosis with minimal mixed plaque in the proximal D1.  There is 25 to 49% noncalcified plaque in the proximal left circumflex -Coronary FFR did not show any significant hemodynamically flow-limiting lesion in the LAD by cardiac cath was recommended -will make n.p.o. after midnight on Sunday -Plan left heart cath with possible PCI on Monday if renal function remains stable -Continue aspirin 81 mg daily and moderate dose statin therapy  HTN  -BP stable at 140/90 mmHg  -Continue amlodipine 5 mg daily  -Lisinopril held for rising serum creatinine with diuresis.  Continue to hold due to need for heart cath next week  Otherwise per primary  - Poorly controlled Type 2 diabetes mellitus - AKI vs CKD   - Mild cellulitis of  the right lower extremity >> on antibiotics per [facility] - Depression/anxiety  - High risk sexual activity     I have spent a total of 35 minutes with patient reviewing coronary CTA, 2D echo, telemetry, EKGs, labs and examining patient as well as establishing an assessment and plan that was discussed with the patient.  > 50% of time was spent in direct patient care.     For questions or updates, please contact [facility] Please consult [email or web address] for contact info under     Signed, Armanda Magic, MD  03/19/2022, 12:39 PM

## 2022-03-20 DIAGNOSIS — Z794 Long term (current) use of insulin: Secondary | ICD-10-CM

## 2022-03-20 DIAGNOSIS — L03115 Cellulitis of right lower limb: Secondary | ICD-10-CM | POA: Diagnosis not present

## 2022-03-20 DIAGNOSIS — I2510 Atherosclerotic heart disease of native coronary artery without angina pectoris: Secondary | ICD-10-CM | POA: Diagnosis not present

## 2022-03-20 DIAGNOSIS — I5033 Acute on chronic diastolic (congestive) heart failure: Secondary | ICD-10-CM | POA: Diagnosis not present

## 2022-03-20 DIAGNOSIS — I6529 Occlusion and stenosis of unspecified carotid artery: Secondary | ICD-10-CM

## 2022-03-20 DIAGNOSIS — F419 Anxiety disorder, unspecified: Secondary | ICD-10-CM

## 2022-03-20 DIAGNOSIS — I110 Hypertensive heart disease with heart failure: Secondary | ICD-10-CM

## 2022-03-20 DIAGNOSIS — F32A Depression, unspecified: Secondary | ICD-10-CM

## 2022-03-20 DIAGNOSIS — E119 Type 2 diabetes mellitus without complications: Secondary | ICD-10-CM

## 2022-03-20 DIAGNOSIS — I509 Heart failure, unspecified: Secondary | ICD-10-CM | POA: Diagnosis not present

## 2022-03-20 DIAGNOSIS — N179 Acute kidney failure, unspecified: Secondary | ICD-10-CM | POA: Diagnosis not present

## 2022-03-20 LAB — URINALYSIS, ROUTINE W REFLEX MICROSCOPIC
Bacteria, UA: NONE SEEN
Bilirubin Urine: NEGATIVE
Glucose, UA: >=500 mg/dL — AB
Hgb urine dipstick: NEGATIVE
Ketones, ur: NEGATIVE mg/dL
Leukocytes,Ua: NEGATIVE
Nitrite: NEGATIVE
Protein, ur: 100 mg/dL — AB
Specific Gravity, Urine: 1.026 (ref 1.005–1.030)
pH: 5 (ref 5.0–8.0)

## 2022-03-20 LAB — BASIC METABOLIC PANEL
Anion gap: 13 (ref 5–15)
BUN: 39 mg/dL — ABNORMAL HIGH (ref 6–20)
CO2: 25 mmol/L (ref 22–32)
Calcium: 9.1 mg/dL (ref 8.9–10.3)
Chloride: 98 mmol/L (ref 98–111)
Creatinine, Ser: 1.78 mg/dL — ABNORMAL HIGH (ref 0.61–1.24)
GFR, Estimated: 46 mL/min — ABNORMAL LOW (ref >60)
Glucose, Bld: 187 mg/dL — ABNORMAL HIGH (ref 70–99)
Potassium: 3.9 mmol/L (ref 3.5–5.1)
Sodium: 136 mmol/L (ref 135–145)

## 2022-03-20 LAB — SODIUM, URINE, RANDOM: Sodium, Ur: 38 mmol/L

## 2022-03-20 MED ORDER — SODIUM CHLORIDE 0.9 % IV SOLN: INTRAVENOUS | Status: DC

## 2022-03-20 MED ORDER — METOPROLOL SUCCINATE ER 25 MG PO TB24
25.0000 mg | ORAL_TABLET | Freq: Every day | ORAL | Status: DC
  Administered 2022-03-20 – 2022-03-21 (×2): 25 mg via ORAL
  Filled 2022-03-20 (×2): qty 1

## 2022-03-20 MED ORDER — SODIUM CHLORIDE 0.9% FLUSH: 3.0000 mL | INTRAVENOUS | Status: DC | PRN

## 2022-03-20 MED ORDER — AMLODIPINE BESYLATE 5 MG PO TABS
5.0000 mg | ORAL_TABLET | Freq: Every day | ORAL | Status: DC
  Administered 2022-03-20 – 2022-03-24 (×5): 5 mg via ORAL
  Filled 2022-03-20 (×5): qty 1

## 2022-03-20 MED ORDER — INSULIN GLARGINE-YFGN 100 UNIT/ML ~~LOC~~ SOLN
8.0000 [IU] | Freq: Every day | SUBCUTANEOUS | Status: DC
Start: 2022-03-21 — End: 2022-03-21
  Administered 2022-03-21: 8 [IU] via SUBCUTANEOUS
  Filled 2022-03-20: qty 0.08

## 2022-03-20 MED ORDER — AMLODIPINE BESYLATE 10 MG PO TABS: 10.0000 mg | ORAL_TABLET | Freq: Every day | ORAL | Status: DC

## 2022-03-20 MED ORDER — INSULIN GLARGINE-YFGN 100 UNIT/ML ~~LOC~~ SOLN: 16.0000 [IU] | Freq: Once | SUBCUTANEOUS | Status: DC

## 2022-03-20 MED ORDER — SODIUM CHLORIDE 0.9 % IV SOLN: 250.0000 mL | INTRAVENOUS | Status: DC | PRN

## 2022-03-20 NOTE — Progress Notes (Addendum)
IVF 50cc/hr after midnight placed per Dr. Norris Cross request. Also noted pt is in insulin. Sent msg to IM team to review insulin dosing since pt will be NPO after midnight, msg was acknowledged.

## 2022-03-20 NOTE — H&P (View-Only) (Signed)
Progress Note  Patient Name: Leroy Simmons Date of Encounter: 03/20/2022  [facility] Cardiologist: Parke Poisson, MD   Subjective   Denies any chest pain or shortness of breath Inpatient Medications    Scheduled Meds:  amLODipine  5 mg Oral Daily   aspirin  81 mg Oral Daily   busPIRone  7.5 mg Oral BID   cephALEXin  500 mg Oral Q12H   emtricitabine-tenofovir AF  1 tablet Oral Daily   enoxaparin (LOVENOX) injection  40 mg Subcutaneous Daily   insulin aspart  0-20 Units Subcutaneous TID WC   insulin glargine-yfgn  16 Units Subcutaneous Daily   living well with diabetes book   Does not apply Once   metoprolol succinate  12.5 mg Oral Daily   rosuvastatin  20 mg Oral Daily   sertraline  25 mg Oral Daily   sodium chloride flush  3 mL Intravenous Q12H   Continuous Infusions:  PRN Meds: acetaminophen, ondansetron (ZOFRAN) IV, mouth rinse   Vital Signs    Vitals:   03/19/22 2354 03/20/22 0300 03/20/22 0500 03/20/22 0729  BP: (!) 139/96   (!) 147/106  Pulse: 76 75  82  Resp: 16     Temp: 98.7 F (37.1 C)   (!) 97.5 F (36.4 C)  TempSrc: Oral   Oral  SpO2: 99% 95%  98%  Weight:   94.8 kg   Height:        Intake/Output Summary (Last 24 hours) at 03/20/2022 3546 Last data filed at 03/20/2022 0805 Gross per 24 hour  Intake 540 ml  Output 3950 ml  Net -3410 ml       03/20/2022    5:00 AM 03/19/2022    5:00 AM 03/18/2022    4:11 AM  Last 3 Weights  Weight (lbs) 208 lb 15.9 oz 208 lb 8.9 oz 208 lb 5.4 oz  Weight (kg) 94.8 kg 94.6 kg 94.5 kg      Telemetry    Normal sinus rhythm- Personally Reviewed  ECG    No new EKG to review- Personally Reviewed  Physical Exam   GEN: Well nourished, well developed in no acute distress HEENT: Normal NECK: No JVD; No carotid bruits LYMPHATICS: No lymphadenopathy CARDIAC:RRR, no murmurs, rubs, gallops RESPIRATORY:  Clear to auscultation without rales, wheezing or rhonchi  ABDOMEN: Soft, non-tender,  non-distended MUSCULOSKELETAL:  No edema; No deformity  SKIN: Warm and dry NEUROLOGIC:  Alert and oriented x 3 PSYCHIATRIC:  Normal affect     High Sensitivity Troponin:   Recent Labs  Lab 03/16/22 1945 03/16/22 2243 03/17/22 0932 03/17/22 1249  TROPONINIHS 30* 31* 32* 31*      Chemistry Recent Labs  Lab 03/16/22 1945 03/17/22 1249 03/18/22 0434 03/19/22 0027 03/20/22 0051  NA 136   < > 139 134* 136  K 3.3*   < > 3.5 4.6 3.9  CL 97*   < > 99 95* 98  CO2 25   < > 27 28 25   GLUCOSE 366*   < > 235* 243* 187*  BUN 25*   < > 26* 34* 39*  CREATININE 1.54*   < > 1.48* 1.57* 1.78*  CALCIUM 9.3   < > 8.5* 8.8* 9.1  MG  --   --   --  2.4  --   PROT 7.4  --   --   --   --   ALBUMIN 3.5  --   --   --   --   AST  15  --   --   --   --   ALT 19  --   --   --   --   ALKPHOS 103  --   --   --   --   BILITOT 1.2  --   --   --   --   GFRNONAA 55*   < > 58* 54* 46*  ANIONGAP 14   < > 13 11 13    < > = values in this interval not displayed.     Lipids  Recent Labs  Lab 03/18/22 0434  CHOL 152  TRIG 153*  HDL 40*  LDLCALC 81  CHOLHDL 3.8     Hematology Recent Labs  Lab 03/16/22 1945 03/17/22 0932  WBC 19.0* 12.6*  RBC 4.43 4.24  HGB 13.7 13.4  HCT 40.0 38.4*  MCV 90.3 90.6  MCH 30.9 31.6  MCHC 34.3 34.9  RDW 11.4* 11.9  PLT 335 265    Thyroid No results for input(s): "TSH", "FREET4" in the last 168 hours.  BNP Recent Labs  Lab 03/16/22 1945  BNP 57.1     DDimer  Recent Labs  Lab 03/17/22 0932  DDIMER 0.40      Radiology    VAS 03/19/22 LOWER EXTREMITY VENOUS (DVT)  Result Date: 03/19/2022  Lower Venous DVT Study Patient Name:  Leroy Simmons  Date of Exam:   03/18/2022 Medical Rec #: 03/20/2022       Accession #:    [id number] Date of Birth: 1970-10-19       Patient Gender: M Patient Age:   25 years Exam Location:  [facility] Procedure:      VAS [facility] LOWER EXTREMITY VENOUS (DVT) Referring Phys: [place]  --------------------------------------------------------------------------------  Indications: Edema.  Performing Technologist: Glynn Octave  Examination Guidelines: A complete evaluation includes B-mode imaging, spectral Doppler, color Doppler, and power Doppler as needed of all accessible portions of each vessel. Bilateral testing is considered an integral part of a complete examination. Limited examinations for reoccurring indications may be performed as noted. The reflux portion of the exam is performed with the patient in reverse Trendelenburg.  +---------+---------------+---------+-----------+----------+--------------+ RIGHT    CompressibilityPhasicitySpontaneityPropertiesThrombus Aging +---------+---------------+---------+-----------+----------+--------------+ CFV      Full           Yes      Yes                                 +---------+---------------+---------+-----------+----------+--------------+ SFJ      Full                                                        +---------+---------------+---------+-----------+----------+--------------+ FV Prox  Full                                                        +---------+---------------+---------+-----------+----------+--------------+ FV Mid   Full                                                        +---------+---------------+---------+-----------+----------+--------------+  FV DistalFull                                                        +---------+---------------+---------+-----------+----------+--------------+ PFV      Full                                                        +---------+---------------+---------+-----------+----------+--------------+ POP      Full           Yes      Yes                                 +---------+---------------+---------+-----------+----------+--------------+ PTV      Full                                                         +---------+---------------+---------+-----------+----------+--------------+ PERO     Full                                                        +---------+---------------+---------+-----------+----------+--------------+   +---------+---------------+---------+-----------+----------+--------------+ LEFT     CompressibilityPhasicitySpontaneityPropertiesThrombus Aging +---------+---------------+---------+-----------+----------+--------------+ CFV      Full           Yes      Yes                                 +---------+---------------+---------+-----------+----------+--------------+ SFJ      Full                                                        +---------+---------------+---------+-----------+----------+--------------+ FV Prox  Full                                                        +---------+---------------+---------+-----------+----------+--------------+ FV Mid   Full                                                        +---------+---------------+---------+-----------+----------+--------------+ FV DistalFull                                                        +---------+---------------+---------+-----------+----------+--------------+  PFV      Full                                                        +---------+---------------+---------+-----------+----------+--------------+ POP      Full           Yes      Yes                                 +---------+---------------+---------+-----------+----------+--------------+ PTV      Full                                                        +---------+---------------+---------+-----------+----------+--------------+ PERO     Full                                                        +---------+---------------+---------+-----------+----------+--------------+     Summary: BILATERAL: - No evidence of deep vein thrombosis seen in the lower extremities, bilaterally. -No evidence of  popliteal cyst, bilaterally.   *See table(s) above for measurements and observations. Electronically signed by Jamelle Haring on 03/19/2022 at 12:11:58 PM.    Final    CT CORONARY FRACTIONAL FLOW RESERVE DATA PREP  Result Date: 03/18/2022 EXAM: CT FFR ANALYSIS CLINICAL DATA:  angina FINDINGS: FFRct analysis was performed on the original cardiac CT angiogram dataset. Diagrammatic representation of the FFRct analysis is provided in a separate PDF document in PACS. This dictation was created using the PDF document and an interactive 3D model of the results. 3D model is not available in the EMR/PACS. Normal FFR range is >0.80. 1. Left Main:  No significant stenosis. FFR = 1.00 2. LAD: Significant stenosis. Proximal FFR = 0.97, Mid FFR = 0.76, Distal FFR = 0.61 3. LCX: No significant stenosis. Proximal FFR = 0.94, Distal FFR = 0.81 4. RCA: No significant stenosis. Proximal FFR =0.99, Mid FFR = 0.93, Distal FFR = 0.87 IMPRESSION: 1.  CT FFR does show significant proximal to mid LAD stenosis. 2.  Definitive cardiac catheterization is recommended. Electronically Signed   By: Pixie Casino M.D.   On: 03/18/2022 13:55   CT CORONARY MORPH W/CTA COR W/SCORE W/CA W/CM &/OR WO/CM  Addendum Date: 03/18/2022   ADDENDUM REPORT: 03/18/2022 11:35 HISTORY: 51 yo male with chest pain/anginal equiv, intermediate CAD risk, not treadmill candidate EXAM: Cardiac/Coronary CTA TECHNIQUE: The patient was scanned on a [employer]. PROTOCOL: A 120 kV prospective scan was triggered in the descending thoracic aorta at 111 HU's. Axial non-contrast 3 mm slices were carried out through the heart. The data set was analyzed on a dedicated work station and scored using the [place]. Gantry rotation speed was 250 msecs and collimation was .6 mm. Beta blockade and 0.8 mg of sl NTG was given. The 3D data set was reconstructed in 5% intervals of the 35-75 % of the R-R cycle. Diastolic phases were analyzed on a dedicated work station  using MPR, MIP and VRT modes. The patient received 80mL OMNIPAQUE IOHEXOL 350 MG/ML SOLN of contrast. FINDINGS: Quality: Good, HR 68 Coronary calcium score: The patient's coronary artery calcium score is 431, which places the patient in the 98th percentile. Coronary arteries: Normal coronary origins.  Right dominance. Right Coronary Artery: Dominant. Mild mixed proximal 25-49% stenosis (CADRADS1). There is plaque at the crux with positive remodeling and spotty calcification. There is minimal 1-24% mixed distal vessel stenosis. Left Main Coronary Artery: Normal. Bifurcates into the LAD and LCx arteries. Left Anterior Descending Coronary Artery: Anterior artery which wraps around the apex. There is eccentric ostial calcification and spotty proximal calcification with associated low attenuation plaque and 70-99% stenoisis (CADRADS4a) - the LAD measures 1.02 mm at its smallest point. The proximal segment of atherosclerosis extends 50 mm from the LAD/LCX bifurcation. D1 branch has minimal mixed proximal disease. Left Circumflex Artery: Lateral wall vessel. There is a mild 25-49% non-calcified stenosis of the proximal vessel (CADRADS2). Aorta: Normal size, 32 mm at the mid ascending aorta (level of the PA bifurcation) measured double oblique. No calcifications. No dissection. Aortic Valve: Trileaflet. No calcifications. Other findings: Normal pulmonary vein drainage into the left atrium. Normal left atrial appendage without a thrombus. Normal size of the pulmonary artery. IMPRESSION: 1. 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 CT FFR will be performed and reported separately. 2. Coronary calcium score of 431. This was 98th percentile for age and sex matched control. 3. Normal coronary origin with right dominance. 4. Definitive cardiac catheterization is recommended. Electronically Signed   By: Kenneth C Hilty M.D.   On: 03/18/2022 11:35   Result Date: 03/18/2022 EXAM: OVER-READ  INTERPRETATION  CT CHEST The following report is a limited chest CT over-read performed by radiologist Dr. Daniel Entrikin of [facility] on 03/18/2022. The coronary calcium score and cardiac CTA interpretation by the cardiologist is attached. COMPARISON:  None Available. FINDINGS: Small left lower lobe pulmonary nodule measuring 8 mm abutting the left hemidiaphragm (axial image 42 of series 11). Within the visualized portions of the thorax there is no acute consolidative airspace disease, no pleural effusions, no pneumothorax and no lymphadenopathy. Visualized portions of the upper abdomen are unremarkable. There are no aggressive appearing lytic or blastic lesions noted in the visualized portions of the skeleton. IMPRESSION: 1. 8 mm left lower lobe pulmonary nodule. Non-contrast chest CT at 6-12 months is recommended. If the nodule is stable at time of repeat CT, then future CT at 18-24 months (from today's scan) is considered optional for low-risk patients, but is recommended for high-risk patients. This recommendation follows the consensus statement: Guidelines for Management of Incidental Pulmonary Nodules Detected on CT Images: From the Fleischner Society 2017; Radiology 2017; 284:228-243. Electronically Signed: By: Daniel  Entrikin M.D. On: 03/18/2022 11:03    Cardiac Studies   Echocardiogram 03/17/22  1. Left ventricular ejection fraction, by estimation, is 55 to 60%. The  left ventricle has normal function. The left ventricle has no regional  wall motion abnormalities. Left ventricular diastolic parameters are  indeterminate.   2. Right ventricular systolic function is normal. The right ventricular  size is normal. Tricuspid regurgitation signal is inadequate for assessing  PA pressure.   3. The mitral valve is normal in structure. Trivial mitral valve  regurgitation. No evidence of mitral stenosis.   4. The aortic valve has an indeterminant number of cusps. Aortic valve   regurgitation is mild. No aortic stenosis is present.     5. The inferior vena cava is normal in size with greater than 50%  respiratory variability, suggesting right atrial pressure of 3 mmHg.   Patient Profile     51 y.o. male with a hx of HTN, DM, HLD, ? CHF  in past and anxiety who was seen 03/17/2022 for the evaluation of chest pain and CHF at the request of Dr. Wyvonnia Dusky.  Assessment & Plan    Chronic HFpEF  - Patient reported having a history of heart failure, presented with SOB on exertion, leg swelling, and intermittent chest pain - Echo this admission showed EF 55-60%, indeterminate diastolic function  - Was taking lasix PTA, did report missing a day or two  - CXR showed no cardiopulmonary disease, BNP within normal limits  - Has been given 2 doses of IV lasix 40 mg so far--he put out 3.4 L yesterday and is net - 7.4 L since admission - Renal function bumped slightly yesterday from 1.48->>1.57->>1.78 today despite holding ACE inhibitor.  Unfortunately he got his a.m. dose of Lasix yesterday but further diuretics were held - Continue to hold ACE inhibitor, Jardiance, diuretics - Hopefully serum creatinine will be improved in the morning for cath.  He did have contrast exposure with coronary CTA which may be having some impact as well - he appears dry today>>will very gently hydrate overnight for cath tomorrow  Chest Pain  Troponin Elevation  - hsTn 30>>31>>32>>31. This is a very minimal elevation with a flat trend-- not consistent with ACS, suspect troponin leak in the setting of mild CHF exacerbation  - Patient concerned about waxing and waning chest pain, has apparently been referred to cardiology by PCP but failed to answer phone calls to arrange appointment.  -Coronary CTA this admission demonstrated coronary calcium score 431 with <25% distal RCA, 25 to 49% mixed proximal RCA stenosis, eccentric ostial LAD ostial LAD calcification and spotty proximal calcification with associated  low-attenuation plaque with 70-99% stenosis with minimal mixed plaque in the proximal D1.  There is 25 to 49% noncalcified plaque in the proximal left circumflex -Coronary FFR did not show any significant hemodynamically flow-limiting lesion in the LAD by cardiac cath was recommended -He is n.p.o. for cath tomorrow -Plan left heart cath with possible PCI on Monday if renal function improves in the next 24 hours -Continue aspirin 81 mg daily, beta-blocker and moderate dose statin therapy  HTN  -BP elevated at 147/106 mmHg today -Continue amlodipine 5 mg daily and increase Toprol-XL to 25 mg daily -Lisinopril on hold for rising serum creatinine with diuresis.   Otherwise per primary  - Poorly controlled Type 2 diabetes mellitus - AKI vs CKD   - Mild cellulitis of the right lower extremity >> on antibiotics per [facility] - Depression/anxiety  - High risk sexual activity     I have spent a total of 35 minutes with patient reviewing coronary CTA, 2D echo, telemetry, EKGs, labs and examining patient as well as establishing an assessment and plan that was discussed with the patient.  > 50% of time was spent in direct patient care.     For questions or updates, please contact [facility] Please consult [email or web address] for contact info under     Signed, Fransico Him, MD  03/20/2022, 8:32 AM

## 2022-03-20 NOTE — Progress Notes (Addendum)
Progress Note  Patient Name: Leroy Simmons Date of Encounter: 03/20/2022  [facility] Cardiologist: Parke Poisson, MD   Subjective   Denies any chest pain or shortness of breath Inpatient Medications    Scheduled Meds:  amLODipine  5 mg Oral Daily   aspirin  81 mg Oral Daily   busPIRone  7.5 mg Oral BID   cephALEXin  500 mg Oral Q12H   emtricitabine-tenofovir AF  1 tablet Oral Daily   enoxaparin (LOVENOX) injection  40 mg Subcutaneous Daily   insulin aspart  0-20 Units Subcutaneous TID WC   insulin glargine-yfgn  16 Units Subcutaneous Daily   living well with diabetes book   Does not apply Once   metoprolol succinate  12.5 mg Oral Daily   rosuvastatin  20 mg Oral Daily   sertraline  25 mg Oral Daily   sodium chloride flush  3 mL Intravenous Q12H   Continuous Infusions:  PRN Meds: acetaminophen, ondansetron (ZOFRAN) IV, mouth rinse   Vital Signs    Vitals:   03/19/22 2354 03/20/22 0300 03/20/22 0500 03/20/22 0729  BP: (!) 139/96   (!) 147/106  Pulse: 76 75  82  Resp: 16     Temp: 98.7 F (37.1 C)   (!) 97.5 F (36.4 C)  TempSrc: Oral   Oral  SpO2: 99% 95%  98%  Weight:   94.8 kg   Height:        Intake/Output Summary (Last 24 hours) at 03/20/2022 3546 Last data filed at 03/20/2022 0805 Gross per 24 hour  Intake 540 ml  Output 3950 ml  Net -3410 ml       03/20/2022    5:00 AM 03/19/2022    5:00 AM 03/18/2022    4:11 AM  Last 3 Weights  Weight (lbs) 208 lb 15.9 oz 208 lb 8.9 oz 208 lb 5.4 oz  Weight (kg) 94.8 kg 94.6 kg 94.5 kg      Telemetry    Normal sinus rhythm- Personally Reviewed  ECG    No new EKG to review- Personally Reviewed  Physical Exam   GEN: Well nourished, well developed in no acute distress HEENT: Normal NECK: No JVD; No carotid bruits LYMPHATICS: No lymphadenopathy CARDIAC:RRR, no murmurs, rubs, gallops RESPIRATORY:  Clear to auscultation without rales, wheezing or rhonchi  ABDOMEN: Soft, non-tender,  non-distended MUSCULOSKELETAL:  No edema; No deformity  SKIN: Warm and dry NEUROLOGIC:  Alert and oriented x 3 PSYCHIATRIC:  Normal affect     High Sensitivity Troponin:   Recent Labs  Lab 03/16/22 1945 03/16/22 2243 03/17/22 0932 03/17/22 1249  TROPONINIHS 30* 31* 32* 31*      Chemistry Recent Labs  Lab 03/16/22 1945 03/17/22 1249 03/18/22 0434 03/19/22 0027 03/20/22 0051  NA 136   < > 139 134* 136  K 3.3*   < > 3.5 4.6 3.9  CL 97*   < > 99 95* 98  CO2 25   < > 27 28 25   GLUCOSE 366*   < > 235* 243* 187*  BUN 25*   < > 26* 34* 39*  CREATININE 1.54*   < > 1.48* 1.57* 1.78*  CALCIUM 9.3   < > 8.5* 8.8* 9.1  MG  --   --   --  2.4  --   PROT 7.4  --   --   --   --   ALBUMIN 3.5  --   --   --   --   AST  15  --   --   --   --   ALT 19  --   --   --   --   ALKPHOS 103  --   --   --   --   BILITOT 1.2  --   --   --   --   GFRNONAA 55*   < > 58* 54* 46*  ANIONGAP 14   < > 13 11 13    < > = values in this interval not displayed.     Lipids  Recent Labs  Lab 03/18/22 0434  CHOL 152  TRIG 153*  HDL 40*  LDLCALC 81  CHOLHDL 3.8     Hematology Recent Labs  Lab 03/16/22 1945 03/17/22 0932  WBC 19.0* 12.6*  RBC 4.43 4.24  HGB 13.7 13.4  HCT 40.0 38.4*  MCV 90.3 90.6  MCH 30.9 31.6  MCHC 34.3 34.9  RDW 11.4* 11.9  PLT 335 265    Thyroid No results for input(s): "TSH", "FREET4" in the last 168 hours.  BNP Recent Labs  Lab 03/16/22 1945  BNP 57.1     DDimer  Recent Labs  Lab 03/17/22 0932  DDIMER 0.40      Radiology    VAS 03/19/22 LOWER EXTREMITY VENOUS (DVT)  Result Date: 03/19/2022  Lower Venous DVT Study Patient Name:  Leroy Simmons  Date of Exam:   03/18/2022 Medical Rec #: 03/20/2022       Accession #:    [id number] Date of Birth: 1970-10-19       Patient Gender: M Patient Age:   51 years Exam Location:  [facility] Procedure:      VAS [facility] LOWER EXTREMITY VENOUS (DVT) Referring Phys: [place]  --------------------------------------------------------------------------------  Indications: Edema.  Performing Technologist: Glynn Octave  Examination Guidelines: A complete evaluation includes B-mode imaging, spectral Doppler, color Doppler, and power Doppler as needed of all accessible portions of each vessel. Bilateral testing is considered an integral part of a complete examination. Limited examinations for reoccurring indications may be performed as noted. The reflux portion of the exam is performed with the patient in reverse Trendelenburg.  +---------+---------------+---------+-----------+----------+--------------+ RIGHT    CompressibilityPhasicitySpontaneityPropertiesThrombus Aging +---------+---------------+---------+-----------+----------+--------------+ CFV      Full           Yes      Yes                                 +---------+---------------+---------+-----------+----------+--------------+ SFJ      Full                                                        +---------+---------------+---------+-----------+----------+--------------+ FV Prox  Full                                                        +---------+---------------+---------+-----------+----------+--------------+ FV Mid   Full                                                        +---------+---------------+---------+-----------+----------+--------------+  FV DistalFull                                                        +---------+---------------+---------+-----------+----------+--------------+ PFV      Full                                                        +---------+---------------+---------+-----------+----------+--------------+ POP      Full           Yes      Yes                                 +---------+---------------+---------+-----------+----------+--------------+ PTV      Full                                                         +---------+---------------+---------+-----------+----------+--------------+ PERO     Full                                                        +---------+---------------+---------+-----------+----------+--------------+   +---------+---------------+---------+-----------+----------+--------------+ LEFT     CompressibilityPhasicitySpontaneityPropertiesThrombus Aging +---------+---------------+---------+-----------+----------+--------------+ CFV      Full           Yes      Yes                                 +---------+---------------+---------+-----------+----------+--------------+ SFJ      Full                                                        +---------+---------------+---------+-----------+----------+--------------+ FV Prox  Full                                                        +---------+---------------+---------+-----------+----------+--------------+ FV Mid   Full                                                        +---------+---------------+---------+-----------+----------+--------------+ FV DistalFull                                                        +---------+---------------+---------+-----------+----------+--------------+  PFV      Full                                                        +---------+---------------+---------+-----------+----------+--------------+ POP      Full           Yes      Yes                                 +---------+---------------+---------+-----------+----------+--------------+ PTV      Full                                                        +---------+---------------+---------+-----------+----------+--------------+ PERO     Full                                                        +---------+---------------+---------+-----------+----------+--------------+     Summary: BILATERAL: - No evidence of deep vein thrombosis seen in the lower extremities, bilaterally. -No evidence of  popliteal cyst, bilaterally.   *See table(s) above for measurements and observations. Electronically signed by Jamelle Haring on 03/19/2022 at 12:11:58 PM.    Final    CT CORONARY FRACTIONAL FLOW RESERVE DATA PREP  Result Date: 03/18/2022 EXAM: CT FFR ANALYSIS CLINICAL DATA:  angina FINDINGS: FFRct analysis was performed on the original cardiac CT angiogram dataset. Diagrammatic representation of the FFRct analysis is provided in a separate PDF document in PACS. This dictation was created using the PDF document and an interactive 3D model of the results. 3D model is not available in the EMR/PACS. Normal FFR range is >0.80. 1. Left Main:  No significant stenosis. FFR = 1.00 2. LAD: Significant stenosis. Proximal FFR = 0.97, Mid FFR = 0.76, Distal FFR = 0.61 3. LCX: No significant stenosis. Proximal FFR = 0.94, Distal FFR = 0.81 4. RCA: No significant stenosis. Proximal FFR =0.99, Mid FFR = 0.93, Distal FFR = 0.87 IMPRESSION: 1.  CT FFR does show significant proximal to mid LAD stenosis. 2.  Definitive cardiac catheterization is recommended. Electronically Signed   By: Pixie Casino M.D.   On: 03/18/2022 13:55   CT CORONARY MORPH W/CTA COR W/SCORE W/CA W/CM &/OR WO/CM  Addendum Date: 03/18/2022   ADDENDUM REPORT: 03/18/2022 11:35 HISTORY: 51 yo male with chest pain/anginal equiv, intermediate CAD risk, not treadmill candidate EXAM: Cardiac/Coronary CTA TECHNIQUE: The patient was scanned on a [employer]. PROTOCOL: A 120 kV prospective scan was triggered in the descending thoracic aorta at 111 HU's. Axial non-contrast 3 mm slices were carried out through the heart. The data set was analyzed on a dedicated work station and scored using the [place]. Gantry rotation speed was 250 msecs and collimation was .6 mm. Beta blockade and 0.8 mg of sl NTG was given. The 3D data set was reconstructed in 5% intervals of the 35-75 % of the R-R cycle. Diastolic phases were analyzed on a dedicated work station  using MPR, MIP and VRT modes. The patient received 48mL OMNIPAQUE IOHEXOL 350 MG/ML SOLN of contrast. FINDINGS: Quality: Good, HR 68 Coronary calcium score: The patient's coronary artery calcium score is 431, which places the patient in the 98th percentile. Coronary arteries: Normal coronary origins.  Right dominance. Right Coronary Artery: Dominant. Mild mixed proximal 25-49% stenosis (CADRADS1). There is plaque at the crux with positive remodeling and spotty calcification. There is minimal 1-24% mixed distal vessel stenosis. Left Main Coronary Artery: Normal. Bifurcates into the LAD and LCx arteries. Left Anterior Descending Coronary Artery: Anterior artery which wraps around the apex. There is eccentric ostial calcification and spotty proximal calcification with associated low attenuation plaque and 70-99% stenoisis (CADRADS4a) - the LAD measures 1.02 mm at its smallest point. The proximal segment of atherosclerosis extends 50 mm from the LAD/LCX bifurcation. D1 branch has minimal mixed proximal disease. Left Circumflex Artery: Lateral wall vessel. There is a mild 25-49% non-calcified stenosis of the proximal vessel (CADRADS2). Aorta: Normal size, 32 mm at the mid ascending aorta (level of the PA bifurcation) measured double oblique. No calcifications. No dissection. Aortic Valve: Trileaflet. No calcifications. Other findings: Normal pulmonary vein drainage into the left atrium. Normal left atrial appendage without a thrombus. Normal size of the pulmonary artery. IMPRESSION: 1. 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 CT FFR will be performed and reported separately. 2. Coronary calcium score of 431. This was 98th percentile for age and sex matched control. 3. Normal coronary origin with right dominance. 4. Definitive cardiac catheterization is recommended. Electronically Signed   By: Pixie Casino M.D.   On: 03/18/2022 11:35   Result Date: 03/18/2022 EXAM: OVER-READ  INTERPRETATION  CT CHEST The following report is a limited chest CT over-read performed by radiologist Dr. Vinnie Langton of [facility], [place] on 03/18/2022. The coronary calcium score and cardiac CTA interpretation by the cardiologist is attached. COMPARISON:  None Available. FINDINGS: Small left lower lobe pulmonary nodule measuring 8 mm abutting the left hemidiaphragm (axial image 42 of series 11). Within the visualized portions of the thorax there is no acute consolidative airspace disease, no pleural effusions, no pneumothorax and no lymphadenopathy. Visualized portions of the upper abdomen are unremarkable. There are no aggressive appearing lytic or blastic lesions noted in the visualized portions of the skeleton. IMPRESSION: 1. 8 mm left lower lobe pulmonary nodule. Non-contrast chest CT at 6-12 months is recommended. If the nodule is stable at time of repeat CT, then future CT at 18-24 months (from today's scan) is considered optional for low-risk patients, but is recommended for high-risk patients. This recommendation follows the consensus statement: Guidelines for Management of Incidental Pulmonary Nodules Detected on CT Images: From the Fleischner Society 2017; Radiology 2017; 284:228-243. Electronically Signed: By: Vinnie Langton M.D. On: 03/18/2022 11:03    Cardiac Studies   Echocardiogram 03/17/22  1. Left ventricular ejection fraction, by estimation, is 55 to 60%. The  left ventricle has normal function. The left ventricle has no regional  wall motion abnormalities. Left ventricular diastolic parameters are  indeterminate.   2. Right ventricular systolic function is normal. The right ventricular  size is normal. Tricuspid regurgitation signal is inadequate for assessing  PA pressure.   3. The mitral valve is normal in structure. Trivial mitral valve  regurgitation. No evidence of mitral stenosis.   4. The aortic valve has an indeterminant number of cusps. Aortic valve   regurgitation is mild. No aortic stenosis is present.  5. The inferior vena cava is normal in size with greater than 50%  respiratory variability, suggesting right atrial pressure of 3 mmHg.   Patient Profile     51 y.o. male with a hx of HTN, DM, HLD, ? CHF  in past and anxiety who was seen 03/17/2022 for the evaluation of chest pain and CHF at the request of Dr. Wyvonnia Dusky.  Assessment & Plan    Chronic HFpEF  - Patient reported having a history of heart failure, presented with SOB on exertion, leg swelling, and intermittent chest pain - Echo this admission showed EF 55-60%, indeterminate diastolic function  - Was taking lasix PTA, did report missing a day or two  - CXR showed no cardiopulmonary disease, BNP within normal limits  - Has been given 2 doses of IV lasix 40 mg so far--he put out 3.4 L yesterday and is net - 7.4 L since admission - Renal function bumped slightly yesterday from 1.48->>1.57->>1.78 today despite holding ACE inhibitor.  Unfortunately he got his a.m. dose of Lasix yesterday but further diuretics were held - Continue to hold ACE inhibitor, Jardiance, diuretics - Hopefully serum creatinine will be improved in the morning for cath.  He did have contrast exposure with coronary CTA which may be having some impact as well - he appears dry today>>will very gently hydrate overnight for cath tomorrow  Chest Pain  Troponin Elevation  - hsTn 30>>31>>32>>31. This is a very minimal elevation with a flat trend-- not consistent with ACS, suspect troponin leak in the setting of mild CHF exacerbation  - Patient concerned about waxing and waning chest pain, has apparently been referred to cardiology by PCP but failed to answer phone calls to arrange appointment.  -Coronary CTA this admission demonstrated coronary calcium score 431 with <25% distal RCA, 25 to 49% mixed proximal RCA stenosis, eccentric ostial LAD ostial LAD calcification and spotty proximal calcification with associated  low-attenuation plaque with 70-99% stenosis with minimal mixed plaque in the proximal D1.  There is 25 to 49% noncalcified plaque in the proximal left circumflex -Coronary FFR did not show any significant hemodynamically flow-limiting lesion in the LAD by cardiac cath was recommended -He is n.p.o. for cath tomorrow -Plan left heart cath with possible PCI on Monday if renal function improves in the next 24 hours -Continue aspirin 81 mg daily, beta-blocker and moderate dose statin therapy  HTN  -BP elevated at 147/106 mmHg today -Continue amlodipine 5 mg daily and increase Toprol-XL to 25 mg daily -Lisinopril on hold for rising serum creatinine with diuresis.   Otherwise per primary  - Poorly controlled Type 2 diabetes mellitus - AKI vs CKD   - Mild cellulitis of the right lower extremity >> on antibiotics per TRH - Depression/anxiety  - High risk sexual activity     I have spent a total of 35 minutes with patient reviewing coronary CTA, 2D echo, telemetry, EKGs, labs and examining patient as well as establishing an assessment and plan that was discussed with the patient.  > 50% of time was spent in direct patient care.     For questions or updates, please contact [place] Please consult [email or web address] for contact info under     Signed, Fransico Him, MD  03/20/2022, 8:32 AM

## 2022-03-20 NOTE — Progress Notes (Signed)
HD#2 Subjective:  Overnight Events: No events  Patient reports doing well.  No complaints.  Said that lower extremity edema has improved.  No shortness of breath or orthopnea.  Reports large urine output overnight.  Objective:  Vital signs in last 24 hours: Vitals:   03/19/22 2300 03/19/22 2354 03/20/22 0300 03/20/22 0500  BP:  (!) 139/96    Pulse: 81 76 75   Resp: 17 16    Temp:  98.7 F (37.1 C)    TempSrc:  Oral    SpO2: 96% 99% 95%   Weight:    94.8 kg  Height:       Supplemental O2: Room Air SpO2: 95 %   Physical Exam:  Physical Exam Constitutional:      General: Leroy Simmons is not in acute distress. HENT:     Head: Normocephalic.  Eyes:     General:        Right eye: No discharge.        Left eye: No discharge.     Conjunctiva/sclera: Conjunctivae normal.  Cardiovascular:     Rate and Rhythm: Normal rate and regular rhythm.     Heart sounds: Normal heart sounds.     Comments: No JVD.  +1 edema of right lower extremity and trace of left lower extremity.  Erythema of RLE has improved significantly Pulmonary:     Effort: Pulmonary effort is normal. No respiratory distress.     Breath sounds: Normal breath sounds. No wheezing.  Skin:    General: Skin is warm.  Neurological:     General: No focal deficit present.     Mental Status: Leroy Simmons is alert.  Psychiatric:        Mood and Affect: Mood normal.     Filed Weights   03/18/22 0411 03/19/22 0500 03/20/22 0500  Weight: 94.5 kg 94.6 kg 94.8 kg     Intake/Output Summary (Last 24 hours) at 03/20/2022 0714 Last data filed at 03/20/2022 1950 Gross per 24 hour  Intake 240 ml  Output 3400 ml  Net -3160 ml   Net IO Since Admission: -7,140 mL [03/20/22 0714]  Pertinent Labs:    Latest Ref Rng & Units 03/17/2022    9:32 AM 03/16/2022    7:45 PM 06/26/2021    5:31 PM  CBC  WBC 4.0 - 10.5 K/uL 12.6  19.0  7.5   Hemoglobin 13.0 - 17.0 g/dL 93.2  67.1  24.5   Hematocrit 39.0 - 52.0 % 38.4  40.0  42.2   Platelets 150  - 400 K/uL 265  335  242        Latest Ref Rng & Units 03/20/2022   12:51 AM 03/19/2022   12:27 AM 03/18/2022    4:34 AM  CMP  Glucose 70 - 99 mg/dL 809  983  382   BUN 6 - 20 mg/dL 39  34  26   Creatinine 0.61 - 1.24 mg/dL 5.05  3.97  6.73   Sodium 135 - 145 mmol/L 136  134  139   Potassium 3.5 - 5.1 mmol/L 3.9  4.6  3.5   Chloride 98 - 111 mmol/L 98  95  99   CO2 22 - 32 mmol/L 25  28  27    Calcium 8.9 - 10.3 mg/dL 9.1  8.8  8.5     Imaging: No results found.  Assessment/Plan:   Principal Problem:   CHF exacerbation (HCC) Active Problems:   Essential hypertension   Diabetes mellitus (HCC)  Cellulitis of right lower extremity   AKI (acute kidney injury) (HCC)   Coronary atherosclerosis of native coronary artery   Acute exacerbation of CHF (congestive heart failure) (HCC)   Patient Summary: Leroy Simmons is a 51 y.o. with a pertinent PMH of HFpEF, type 2 diabetes, hypertension, who presented with acute chest tightness and admitted for acute decompensated HFpEF exacerbation.    CAD with severe stenosis of proximal LAD Chest pain CTA coronary shows severe CAD in the proximal LAD.  Cardiology plan for left heart cath on Monday.   -Appreciate cardiology recommendation -Continue Crestor 20 mg and baby aspirin -His diabetes also need better control   Acute on Chronic HFpEF exacerbation Has had net 3.4 L out overnight.  His weight is unchanged.  Creatinine bumped from 1.57 to 1.78, suggest overdiuresis.  Will hold off on additional Lasix.   -Continue Jardiance   AKI vs CKD Unknown baseline creatinine but evidence of kidney disease on ultrasound.  His baseline may be around 1.4-1.5.  Hold off on diuresis. -Gentle fluid per cardiology tonight preparing for the heart cath tomorrow -BMP in a.m. -Holding lisinopril   Right lower extremity cellulitis DVT has been ruled out.  Erythema and edema have improved -Continue Keflex (4/5)   Uncontrolled type 2 diabetes  A1c of  9.8.   -Will give half dose of Semglee 8 units tomorrow due to n.p.o. status -Cont resistant sliding scale -Continue Jardiance -CBG before every meal and nightly   Hypertension Holding lisinopril   Depression/anxiety - Continue BuSpar and Zoloft  High risk sexual activity - Continue PrEP with [employer] - HIV-negative   Diet: Heart Healthy carb modified IVF: None,None VTE: Enoxaparin Code: Full PT/OT recs: None, none. TOC recs:    Dispo: Anticipated discharge to Home in 2-3 days pending left heart cath.   Doran Stabler, DO 03/20/2022, 7:14 AM Pager: [phone]  Please contact the on call pager after 5 pm and on weekends at [phone].

## 2022-03-21 ENCOUNTER — Encounter (HOSPITAL_COMMUNITY): Payer: Self-pay | Admitting: Interventional Cardiology

## 2022-03-21 ENCOUNTER — Encounter (HOSPITAL_COMMUNITY)
Admission: EM | Disposition: A | Discharge status: Home / Self Care | Payer: Self-pay | Source: Home / Self Care | Attending: Internal Medicine

## 2022-03-21 DIAGNOSIS — N179 Acute kidney failure, unspecified: Secondary | ICD-10-CM | POA: Diagnosis not present

## 2022-03-21 DIAGNOSIS — I5031 Acute diastolic (congestive) heart failure: Secondary | ICD-10-CM | POA: Diagnosis not present

## 2022-03-21 DIAGNOSIS — L03115 Cellulitis of right lower limb: Secondary | ICD-10-CM | POA: Diagnosis not present

## 2022-03-21 DIAGNOSIS — I5033 Acute on chronic diastolic (congestive) heart failure: Secondary | ICD-10-CM | POA: Diagnosis not present

## 2022-03-21 DIAGNOSIS — I509 Heart failure, unspecified: Secondary | ICD-10-CM | POA: Diagnosis not present

## 2022-03-21 DIAGNOSIS — I2510 Atherosclerotic heart disease of native coronary artery without angina pectoris: Secondary | ICD-10-CM | POA: Diagnosis not present

## 2022-03-21 HISTORY — PX: LEFT HEART CATH AND CORONARY ANGIOGRAPHY: CATH118249

## 2022-03-21 LAB — GLUCOSE, CAPILLARY
Glucose-Capillary: 226 mg/dL — ABNORMAL HIGH (ref 70–99)
Glucose-Capillary: 217 mg/dL — ABNORMAL HIGH (ref 70–99)
Glucose-Capillary: 281 mg/dL — ABNORMAL HIGH (ref 70–99)
Glucose-Capillary: 251 mg/dL — ABNORMAL HIGH (ref 70–99)
Glucose-Capillary: 164 mg/dL — ABNORMAL HIGH (ref 70–99)
Glucose-Capillary: 235 mg/dL — ABNORMAL HIGH (ref 70–99)
Glucose-Capillary: 205 mg/dL — ABNORMAL HIGH (ref 70–99)
Glucose-Capillary: 255 mg/dL — ABNORMAL HIGH (ref 70–99)
Glucose-Capillary: 195 mg/dL — ABNORMAL HIGH (ref 70–99)
Glucose-Capillary: 223 mg/dL — ABNORMAL HIGH (ref 70–99)
Glucose-Capillary: 149 mg/dL — ABNORMAL HIGH (ref 70–99)
Glucose-Capillary: 233 mg/dL — ABNORMAL HIGH (ref 70–99)
Glucose-Capillary: 293 mg/dL — ABNORMAL HIGH (ref 70–99)

## 2022-03-21 LAB — BASIC METABOLIC PANEL
Anion gap: 9 (ref 5–15)
BUN: 33 mg/dL — ABNORMAL HIGH (ref 6–20)
CO2: 23 mmol/L (ref 22–32)
Calcium: 8.6 mg/dL — ABNORMAL LOW (ref 8.9–10.3)
Chloride: 104 mmol/L (ref 98–111)
Creatinine, Ser: 1.23 mg/dL (ref 0.61–1.24)
GFR, Estimated: >60 mL/min (ref >60)
Glucose, Bld: 270 mg/dL — ABNORMAL HIGH (ref 70–99)
Potassium: 3.9 mmol/L (ref 3.5–5.1)
Sodium: 136 mmol/L (ref 135–145)

## 2022-03-21 SURGERY — LEFT HEART CATH AND CORONARY ANGIOGRAPHY
Anesthesia: LOCAL

## 2022-03-21 MED ORDER — FUROSEMIDE 10 MG/ML IJ SOLN
40.0000 mg | Freq: Two times a day (BID) | INTRAMUSCULAR | Status: DC
  Administered 2022-03-21 – 2022-03-23 (×4): 40 mg via INTRAVENOUS
  Filled 2022-03-21 (×4): qty 4

## 2022-03-21 MED ORDER — INSULIN GLARGINE-YFGN 100 UNIT/ML ~~LOC~~ SOLN
16.0000 [IU] | Freq: Every day | SUBCUTANEOUS | Status: DC
  Administered 2022-03-22 – 2022-03-23 (×2): 16 [IU] via SUBCUTANEOUS
  Filled 2022-03-21 (×2): qty 0.16

## 2022-03-21 MED ORDER — HEPARIN (PORCINE) IN NACL 1000-0.9 UT/500ML-% IV SOLN
INTRAVENOUS | Status: DC | PRN
  Administered 2022-03-21 (×2): 500 mL

## 2022-03-21 MED ORDER — MIDAZOLAM HCL 2 MG/2ML IJ SOLN
INTRAMUSCULAR | Status: AC
  Filled 2022-03-21: qty 2

## 2022-03-21 MED ORDER — EMPAGLIFLOZIN 10 MG PO TABS: 10.0000 mg | ORAL_TABLET | Freq: Every day | ORAL | Status: DC

## 2022-03-21 MED ORDER — VERAPAMIL HCL 2.5 MG/ML IV SOLN
INTRAVENOUS | Status: DC | PRN
  Administered 2022-03-21 (×2): 10 mL via INTRA_ARTERIAL

## 2022-03-21 MED ORDER — SODIUM CHLORIDE 0.9 % IV SOLN
250.0000 mL | INTRAVENOUS | Status: DC | PRN
Start: 2022-03-21 — End: 2022-03-24

## 2022-03-21 MED ORDER — SODIUM CHLORIDE 0.9% FLUSH
3.0000 mL | INTRAVENOUS | Status: DC | PRN
Start: 2022-03-21 — End: 2022-03-24

## 2022-03-21 MED ORDER — ONDANSETRON HCL 4 MG/2ML IJ SOLN: 4.0000 mg | Freq: Four times a day (QID) | INTRAMUSCULAR | Status: DC | PRN

## 2022-03-21 MED ORDER — LIDOCAINE HCL (PF) 1 % IJ SOLN
INTRAMUSCULAR | Status: DC | PRN
  Administered 2022-03-21: 2 mL via INTRADERMAL

## 2022-03-21 MED ORDER — LIDOCAINE HCL (PF) 1 % IJ SOLN
INTRAMUSCULAR | Status: AC
  Filled 2022-03-21: qty 30

## 2022-03-21 MED ORDER — HEPARIN (PORCINE) IN NACL 1000-0.9 UT/500ML-% IV SOLN
INTRAVENOUS | Status: AC
  Filled 2022-03-21: qty 1000

## 2022-03-21 MED ORDER — FENTANYL CITRATE (PF) 100 MCG/2ML IJ SOLN
INTRAMUSCULAR | Status: DC | PRN
  Administered 2022-03-21 (×2): 25 ug via INTRAVENOUS

## 2022-03-21 MED ORDER — MIDAZOLAM HCL 2 MG/2ML IJ SOLN
INTRAMUSCULAR | Status: DC | PRN
  Administered 2022-03-21: 1 mg via INTRAVENOUS
  Administered 2022-03-21: 2 mg via INTRAVENOUS

## 2022-03-21 MED ORDER — SODIUM CHLORIDE 0.9% FLUSH
3.0000 mL | Freq: Two times a day (BID) | INTRAVENOUS | Status: DC
  Administered 2022-03-21 – 2022-03-24 (×7): 3 mL via INTRAVENOUS

## 2022-03-21 MED ORDER — ACETAMINOPHEN 325 MG PO TABS: 650.0000 mg | ORAL_TABLET | ORAL | Status: DC | PRN

## 2022-03-21 MED ORDER — FENTANYL CITRATE (PF) 100 MCG/2ML IJ SOLN
INTRAMUSCULAR | Status: AC
  Filled 2022-03-21: qty 2

## 2022-03-21 MED ORDER — VERAPAMIL HCL 2.5 MG/ML IV SOLN
INTRAVENOUS | Status: AC
  Filled 2022-03-21: qty 2

## 2022-03-21 MED ORDER — HEPARIN SODIUM (PORCINE) 1000 UNIT/ML IJ SOLN
INTRAMUSCULAR | Status: DC | PRN
  Administered 2022-03-21: 5000 [IU] via INTRAVENOUS

## 2022-03-21 MED ORDER — HYDRALAZINE HCL 20 MG/ML IJ SOLN: 10.0000 mg | INTRAMUSCULAR | Status: DC | PRN

## 2022-03-21 MED ORDER — LABETALOL HCL 5 MG/ML IV SOLN
10.0000 mg | INTRAVENOUS | Status: DC | PRN
Start: 2022-03-21 — End: 2022-03-21

## 2022-03-21 MED ORDER — IOHEXOL 350 MG/ML SOLN
INTRAVENOUS | Status: DC | PRN
  Administered 2022-03-21: 55 mL

## 2022-03-21 SURGICAL SUPPLY — 11 items
BAND CMPR LRG ZPHR (HEMOSTASIS) ×1
BAND ZEPHYR COMPRESS 30 LONG (HEMOSTASIS) ×1 IMPLANT
CATH 5FR JL3.5 JR4 ANG PIG MP (CATHETERS) ×1 IMPLANT
GLIDESHEATH SLEND SS 6F .021 (SHEATH) ×1 IMPLANT
GUIDEWIRE INQWIRE 1.5J.035X260 (WIRE) IMPLANT
INQWIRE 1.5J .035X260CM (WIRE) ×2
KIT HEART LEFT (KITS) ×3 IMPLANT
PACK CARDIAC CATHETERIZATION (CUSTOM PROCEDURE TRAY) ×2 IMPLANT
SYR MEDRAD MARK 7 150ML (SYRINGE) ×2 IMPLANT
TRANSDUCER W/STOPCOCK (MISCELLANEOUS) ×2 IMPLANT
TUBING CIL FLEX 10 FLL-RA (TUBING) ×3 IMPLANT

## 2022-03-21 NOTE — Progress Notes (Signed)
Subjective: Patient seen and evaluated at bedside.  He denies chest pain or shortness of breath.  He also noted improvement in his lower legs, though they were still red and mildly swollen.  Some tenderness with palpation of the lower legs.  He otherwise denies any other complaints.  Objective:  Vital signs in last 24 hours: Vitals:   03/21/22 1034 03/21/22 1039 03/21/22 1044 03/21/22 1110  BP: (!) 144/96 (!) 147/102 (!) 138/94 133/85  Pulse: 81 78 79   Resp: 14 14 16 12   Temp:      TempSrc:      SpO2: 96% 96% 96%   Weight:      Height:       Physical Exam Constitutional:      General: He is not in acute distress. HENT:     Head: Normocephalic and atraumatic.  Cardiovascular:     Rate and Rhythm: Normal rate.     Heart sounds: Normal heart sounds.  Pulmonary:     Effort: Pulmonary effort is normal.     Breath sounds: Normal breath sounds and air entry.  Musculoskeletal:     Right lower leg: Swelling and tenderness present.     Comments: Erythema, swelling and minimal tenderness of the RLE  Neurological:     Mental Status: He is alert.  Psychiatric:        Behavior: Behavior is cooperative.      Assessment/Plan:  Principal Problem:   CHF exacerbation (HCC) Active Problems:   Essential hypertension   Diabetes mellitus (HCC)   Cellulitis of right lower extremity   AKI (acute kidney injury) (HCC)   Coronary atherosclerosis of native coronary artery   Acute exacerbation of CHF (congestive heart failure) (HCC)  Leroy Simmons is a 51 y.o. with a pertinent PMH of HFpEF, type 2 diabetes, hypertension, who presented with acute chest tightness and admitted for acute decompensated HFpEF exacerbation.  Acute on chronic HFpEF exacerbation Overnight net output -750 mL.  Overall -7.8 L output since admission.  Patient diuresed well.  Creatinine levels back to baseline of 1.23 improved from 1.78.  Electrolytes stable. Exacerbation likely due to medication nonadherence, as  noted on admission patient endorsed missing a few doses of his medication. Cardiac cath obtained today revealed severe volume overload with elevated LVEDP of 52. -Restart IV lasix 40mg  BID -Strict Is and Os -Daily weights -Daily BMP -K+> 4 and Mg >2  CAD with stenosis of proximal LAD Patient presented with chest pain and upward mild trend of troponins.  Coronary CTA revealed flow-limiting lesion of the LAD.  Cardiac cath performed today revealing non-obstructive CAD.  Patient denies chest pain at this time. -ASA 81mg  daily -Toprol-XL 25mg  daily -Crestor 20 daily  Hypertension Stable. Diuresis will resume today. Will hold lisinopril and trend Cr levels -Amlodipine 5mg  daily -Toprol XL 25mg  daily  Right lower extremity cellulitis, resolving Patient on final day of antibiotic treatment, Keflex.  Marked improvement in erythema and edema of the lower extremity.  No evidence of DVT on Dopplers. -Keflex day 5 of 5  Type 2 DM A1c on admission, elevated at 9.8%. Long acting insulin dose was temporarily reduced to 8 units in the setting of NPO status overnight. Patient is now tolerating diet and will resume usual dose of 16 units semglee daily. - Resume Semglee 16 units daily -Continue Jardiance  Depression/anxiety - Continue BuSpar and Zoloft  High risk sexual activity - Continue PrEP with Descovy - HIV-negative  Acute kidney injury, resolved  Prior to Admission Living Arrangement: Anticipated Discharge Location: Barriers to Discharge: Dispo: Anticipated discharge in approximately 1-2 day(s).   Dellis Filbert, MD 03/21/2022, 11:21 AM Pager: [phone] After 5pm on weekdays and 1pm on weekends: On Call pager [phone]

## 2022-03-21 NOTE — Interval H&P Note (Signed)
Cath Lab Visit (complete for each Cath Lab visit)  Clinical Evaluation Leading to the Procedure:   ACS: Yes.    Non-ACS:    Anginal Classification: CCS IV  Anti-ischemic medical therapy: Minimal Therapy (1 class of medications)  Non-Invasive Test Results: High-risk stress test findings: cardiac mortality >3%/year  Prior CABG: No previous CABG  Possible ostial LAD lesion    History and Physical Interval Note:  03/21/2022 10:07 AM  Leroy Simmons  has presented today for surgery, with the diagnosis of chest pain.  The various methods of treatment have been discussed with the patient and family. After consideration of risks, benefits and other options for treatment, the patient has consented to  Procedure(s): LEFT HEART CATH AND CORONARY ANGIOGRAPHY (N/A) as a surgical intervention.  The patient's history has been reviewed, patient examined, no change in status, stable for surgery.  I have reviewed the patient's chart and labs.  Questions were answered to the patient's satisfaction.     Lance Muss

## 2022-03-21 NOTE — Progress Notes (Addendum)
Progress Note  Patient Name: Leroy Simmons Date of Encounter: 03/21/2022  [facility] Cardiologist: Parke Poisson, MD   Subjective  Status post cath today showing 50% D2, 40% mid LAD, 25% ramus and severely elevated LVEDP at 33 mmHg with no evidence of AS.  Denies any chest pain.  Inpatient Medications    Scheduled Meds:  amLODipine  5 mg Oral Daily   aspirin  81 mg Oral Daily   busPIRone  7.5 mg Oral BID   cephALEXin  500 mg Oral Q12H   emtricitabine-tenofovir AF  1 tablet Oral Daily   enoxaparin (LOVENOX) injection  40 mg Subcutaneous Daily   insulin aspart  0-20 Units Subcutaneous TID WC   insulin glargine-yfgn  8 Units Subcutaneous Daily   living well with diabetes book   Does not apply Once   metoprolol succinate  25 mg Oral Daily   rosuvastatin  20 mg Oral Daily   sertraline  25 mg Oral Daily   sodium chloride flush  3 mL Intravenous Q12H   sodium chloride flush  3 mL Intravenous Q12H   Continuous Infusions:  sodium chloride 50 mL/hr at 03/21/22 0049   sodium chloride     PRN Meds: sodium chloride, acetaminophen, acetaminophen, hydrALAZINE, labetalol, ondansetron (ZOFRAN) IV, ondansetron (ZOFRAN) IV, mouth rinse, sodium chloride flush   Vital Signs    Vitals:   03/21/22 1034 03/21/22 1039 03/21/22 1044 03/21/22 1110  BP: (!) 144/96 (!) 147/102 (!) 138/94 133/85  Pulse: 81 78 79   Resp: 14 14 16 12   Temp:      TempSrc:      SpO2: 96% 96% 96%   Weight:      Height:        Intake/Output Summary (Last 24 hours) at 03/21/2022 1120 Last data filed at 03/21/2022 0215 Gross per 24 hour  Intake --  Output 500 ml  Net -500 ml       03/21/2022    4:17 AM 03/20/2022    5:00 AM 03/19/2022    5:00 AM  Last 3 Weights  Weight (lbs) 208 lb 8.9 oz 208 lb 15.9 oz 208 lb 8.9 oz  Weight (kg) 94.6 kg 94.8 kg 94.6 kg      Telemetry    Normal sinus rhythm personally Reviewed  ECG    No new EKG to review- Personally Reviewed  Physical Exam   GEN: Well  nourished, well developed in no acute distress HEENT: Normal NECK: No JVD; No carotid bruits LYMPHATICS: No lymphadenopathy CARDIAC:RRR, no murmurs, rubs, gallops RESPIRATORY:  Clear to auscultation without rales, wheezing or rhonchi  ABDOMEN: Soft, non-tender, non-distended MUSCULOSKELETAL:  No edema; No deformity  SKIN: Warm and dry NEUROLOGIC:  Alert and oriented x 3 PSYCHIATRIC:  Normal affect     High Sensitivity Troponin:   Recent Labs  Lab 03/16/22 1945 03/16/22 2243 03/17/22 0932 03/17/22 1249  TROPONINIHS 30* 31* 32* 31*      Chemistry Recent Labs  Lab 03/16/22 1945 03/17/22 1249 03/19/22 0027 03/20/22 0051 03/21/22 0118  NA 136   < > 134* 136 136  K 3.3*   < > 4.6 3.9 3.9  CL 97*   < > 95* 98 104  CO2 25   < > 28 25 23   GLUCOSE 366*   < > 243* 187* 270*  BUN 25*   < > 34* 39* 33*  CREATININE 1.54*   < > 1.57* 1.78* 1.23  CALCIUM 9.3   < > 8.8*  9.1 8.6*  MG  --   --  2.4  --   --   PROT 7.4  --   --   --   --   ALBUMIN 3.5  --   --   --   --   AST 15  --   --   --   --   ALT 19  --   --   --   --   ALKPHOS 103  --   --   --   --   BILITOT 1.2  --   --   --   --   GFRNONAA 55*   < > 54* 46* >60  ANIONGAP 14   < > 11 13 9    < > = values in this interval not displayed.     Lipids  Recent Labs  Lab 03/18/22 0434  CHOL 152  TRIG 153*  HDL 40*  LDLCALC 81  CHOLHDL 3.8     Hematology Recent Labs  Lab 03/16/22 1945 03/17/22 0932  WBC 19.0* 12.6*  RBC 4.43 4.24  HGB 13.7 13.4  HCT 40.0 38.4*  MCV 90.3 90.6  MCH 30.9 31.6  MCHC 34.3 34.9  RDW 11.4* 11.9  PLT 335 265    Thyroid No results for input(s): "TSH", "FREET4" in the last 168 hours.  BNP Recent Labs  Lab 03/16/22 1945  BNP 57.1     DDimer  Recent Labs  Lab 03/17/22 0932  DDIMER 0.40      Radiology    CARDIAC CATHETERIZATION  Result Date: 03/21/2022   2nd Diag lesion is 50% stenosed.   Mid LAD lesion is 40% stenosed.   Ramus lesion is 25% stenosed.   LV end  diastolic pressure is severely elevated.  LVEDP 33 mm Hg.   There is no aortic valve stenosis. Nonobstructive CAD.  Volume overload. Continue diuresis.    Cardiac Studies   Echocardiogram 03/17/22  1. Left ventricular ejection fraction, by estimation, is 55 to 60%. The  left ventricle has normal function. The left ventricle has no regional  wall motion abnormalities. Left ventricular diastolic parameters are  indeterminate.   2. Right ventricular systolic function is normal. The right ventricular  size is normal. Tricuspid regurgitation signal is inadequate for assessing  PA pressure.   3. The mitral valve is normal in structure. Trivial mitral valve  regurgitation. No evidence of mitral stenosis.   4. The aortic valve has an indeterminant number of cusps. Aortic valve  regurgitation is mild. No aortic stenosis is present.   5. The inferior vena cava is normal in size with greater than 50%  respiratory variability, suggesting right atrial pressure of 3 mmHg.   Patient Profile     51 y.o. male with a hx of HTN, DM, HLD, ? CHF  in past and anxiety who was seen 03/17/2022 for the evaluation of chest pain and CHF at the request of Dr. 03/19/2022.  Assessment & Plan    Chronic HFpEF  - Patient reported having a history of heart failure, presented with SOB on exertion, leg swelling, and intermittent chest pain - Echo this admission showed EF 55-60%, indeterminate diastolic function  - Was taking lasix PTA, did report missing a day or two  - CXR showed no cardiopulmonary disease, BNP within normal limits  - Has been given 2 doses of IV lasix 40 mg so far--he put out 3.4 L yesterday and is net - 7.4 L since admission - Renal function bumped  slightly yesterday from 1.48->>1.57->>1.78 yesterday despite holding ACE inhibitor.   - Diuretics held yesterday with improvement of serum creatinine from 1.78->>1.23 today - Cath today showed severe volume overload with LVEDP 33 mmHg and nonobstructive CAD -  Restart Lasix 40 mg IV twice daily - Follow strict I's and O's, daily weights and renal function while diuresing - Continue to hold ACE inhibitor, Jardiance while diuresing  Chest Pain  Troponin Elevation  - hsTn 30>>31>>32>>31. This is a very minimal elevation with a flat trend-- not consistent with ACS, suspect troponin leak in the setting of mild CHF exacerbation  - Patient concerned about waxing and waning chest pain, has apparently been referred to cardiology by PCP but failed to answer phone calls to arrange appointment.  -Coronary CTA this admission demonstrated coronary calcium score 431 with <25% distal RCA, 25 to 49% mixed proximal RCA stenosis, eccentric ostial LAD ostial LAD calcification and spotty proximal calcification with associated low-attenuation plaque with 70-99% stenosis with minimal mixed plaque in the proximal D1.  There is 25 to 49% noncalcified plaque in the proximal left circumflex -Coronary FFR did not show any significant hemodynamically flow-limiting lesion in the LAD by cardiac cath was recommended -Cath today showed  40% mid LAD, 25% ramus and 50% D2 -Continue aspirin 81 mg daily, Toprol-XL 25 mg daily and moderate dose statin therapy  HTN  -BP controlled at 133/85 mmHg today -Continue amlodipine 5 mg daily and Toprol-XL 25 mg daily -Lisinopril on hold for rising serum creatinine with diuresis.   Otherwise per primary  - Poorly controlled Type 2 diabetes mellitus - AKI vs CKD   - Mild cellulitis of the right lower extremity >> on antibiotics per [facility] - Depression/anxiety  - High risk sexual activity     I have spent a total of 35 minutes with patient reviewing coronary CTA, cardiac cath, 2D echo, telemetry, EKGs, labs and examining patient as well as establishing an assessment and plan that was discussed with the patient.  > 50% of time was spent in direct patient care.     For questions or updates, please contact [facility] Please consult [email or web address]  for contact info under     Signed, Armanda Magic, MD  03/21/2022, 11:20 AM

## 2022-03-21 NOTE — Progress Notes (Signed)
Pt CBG did not transfer from glucometer- Result is 255

## 2022-03-22 DIAGNOSIS — I5033 Acute on chronic diastolic (congestive) heart failure: Secondary | ICD-10-CM | POA: Diagnosis not present

## 2022-03-22 DIAGNOSIS — E1165 Type 2 diabetes mellitus with hyperglycemia: Secondary | ICD-10-CM | POA: Diagnosis not present

## 2022-03-22 DIAGNOSIS — I2510 Atherosclerotic heart disease of native coronary artery without angina pectoris: Secondary | ICD-10-CM | POA: Diagnosis not present

## 2022-03-22 DIAGNOSIS — I110 Hypertensive heart disease with heart failure: Secondary | ICD-10-CM | POA: Diagnosis not present

## 2022-03-22 DIAGNOSIS — N179 Acute kidney failure, unspecified: Secondary | ICD-10-CM | POA: Diagnosis not present

## 2022-03-22 DIAGNOSIS — I509 Heart failure, unspecified: Secondary | ICD-10-CM | POA: Diagnosis not present

## 2022-03-22 DIAGNOSIS — L03115 Cellulitis of right lower limb: Secondary | ICD-10-CM | POA: Diagnosis not present

## 2022-03-22 LAB — GLUCOSE, CAPILLARY
Glucose-Capillary: 280 mg/dL — ABNORMAL HIGH (ref 70–99)
Glucose-Capillary: 219 mg/dL — ABNORMAL HIGH (ref 70–99)
Glucose-Capillary: 217 mg/dL — ABNORMAL HIGH (ref 70–99)
Glucose-Capillary: 281 mg/dL — ABNORMAL HIGH (ref 70–99)

## 2022-03-22 LAB — BASIC METABOLIC PANEL
Anion gap: 10 (ref 5–15)
BUN: 33 mg/dL — ABNORMAL HIGH (ref 6–20)
CO2: 26 mmol/L (ref 22–32)
Calcium: 9 mg/dL (ref 8.9–10.3)
Chloride: 102 mmol/L (ref 98–111)
Creatinine, Ser: 1.28 mg/dL — ABNORMAL HIGH (ref 0.61–1.24)
GFR, Estimated: >60 mL/min (ref >60)
Glucose, Bld: 273 mg/dL — ABNORMAL HIGH (ref 70–99)
Potassium: 4.1 mmol/L (ref 3.5–5.1)
Sodium: 138 mmol/L (ref 135–145)

## 2022-03-22 LAB — MAGNESIUM: Magnesium: 2.3 mg/dL (ref 1.7–2.4)

## 2022-03-22 MED ORDER — METOPROLOL SUCCINATE ER 50 MG PO TB24
50.0000 mg | ORAL_TABLET | Freq: Every day | ORAL | Status: DC
  Administered 2022-03-22 – 2022-03-24 (×3): 50 mg via ORAL
  Filled 2022-03-22 (×3): qty 1

## 2022-03-22 NOTE — Progress Notes (Signed)
Subjective: Patient seen and evaluated at bedside. He states he feels well. Denies chest pain or shortness of breath. NO issues with cath site. Eating and drinking appropriately. No further concerns at this time.  Objective:  Vital signs in last 24 hours: Vitals:   03/21/22 2134 03/22/22 0018 03/22/22 0421 03/22/22 0744  BP: (!) 144/85 (!) 140/91 132/87 131/80  Pulse: 86 78 74 79  Resp: 19 12    Temp: 98.3 F (36.8 C) 98.1 F (36.7 C) 98.2 F (36.8 C) 97.8 F (36.6 C)  TempSrc: Oral Oral Oral Oral  SpO2: 98% 99% 95% 97%  Weight:   94 kg   Height:       Physical Exam Constitutional:      General: He is not in acute distress. HENT:     Head: Normocephalic and atraumatic.  Neck:     Vascular: No JVD.  Cardiovascular:     Rate and Rhythm: Normal rate.     Heart sounds: Normal heart sounds.  Pulmonary:     Effort: Pulmonary effort is normal.     Breath sounds: Normal breath sounds and air entry.  Abdominal:     General: Abdomen is flat.     Palpations: Abdomen is soft.  Musculoskeletal:     Right lower leg: No edema.     Left lower leg: No edema.  Skin:    General: Skin is warm and dry.  Neurological:     General: No focal deficit present.     Mental Status: He is alert and oriented to person, place, and time.  Psychiatric:        Behavior: Behavior normal. Behavior is cooperative.      Assessment/Plan:  Principal Problem:   CHF exacerbation (HCC) Active Problems:   Essential hypertension   Diabetes mellitus (HCC)   Cellulitis of right lower extremity   AKI (acute kidney injury) (HCC)   Coronary atherosclerosis of native coronary artery   Acute exacerbation of CHF (congestive heart failure) (HCC)  FIDEL CAGGIANO is a 51 y.o. with a pertinent PMH of HFpEF, type 2 diabetes, hypertension, who presented with acute chest tightness and admitted for acute decompensated HFpEF exacerbation.  Acute on chronic HFpEF exacerbation Cardiac cath obtained yesterday  revealed severe volume overload with elevated LVEDP of . Although, on exam patient appears euvolemic. Will continue diuresis, - overnight. Monitor output today, consider increasing lasix dose if insufficient output noted. -IV lasix 40mg  BID --> consider increase to IV 80mg  BID -Strict Is and Os -Daily weights -Daily BMP -K+> 4 and Mg >2   CAD with stenosis of proximal LAD Cardiac cath performed yesterday revealing non-obstructive CAD.  Patient denies chest pain at this time. -ASA 81mg  daily -Toprol-XL 25mg  daily -Crestor 20 daily   Hypertension Stable. Diuresis continuing as stated above. Will hold lisinopril and trend Cr levels -Amlodipine 5mg  daily -Toprol XL 25mg  daily   Type 2 DM A1c on admission, elevated at 9.8%. Glucose ranges have not been at goal, ranging in the high 200s. Will continue current regimen and if levels remain elevated, will increase long acting insulin. -Semglee 16 units daily -Hold Jardiance in the setting of diuresis   Depression/anxiety - Continue BuSpar and Zoloft  High risk sexual activity - Continue PrEP with Descovy - HIV-negative   Right lower extremity cellulitis, resolved Patient has completed 5 day course of keflex and reports marked improvement in right lower leg. NO erythema noted of the lower extremity. Resolving skin changes.  Prior to Admission Living Arrangement: Anticipated Discharge Location: Barriers to Discharge: Dispo: Anticipated discharge in approximately 1-2 day(s).   Dellis Filbert, MD 03/22/2022, 9:54 AM Pager: [phone] After 5pm on weekdays and 1pm on weekends: On Call pager [phone]

## 2022-03-22 NOTE — Plan of Care (Signed)
  Problem: Activity: Goal: Ability to tolerate increased activity will improve Outcome: Progressing   Problem: Cardiac: Goal: Ability to achieve and maintain adequate cardiovascular perfusion will improve Outcome: Progressing   Problem: Coping: Goal: Ability to adjust to condition or change in health will improve Outcome: Progressing   Problem: Fluid Volume: Goal: Ability to maintain a balanced intake and output will improve Outcome: Progressing   Problem: Skin Integrity: Goal: Risk for impaired skin integrity will decrease Outcome: Progressing   Problem: Tissue Perfusion: Goal: Adequacy of tissue perfusion will improve Outcome: Progressing   Problem: Education: Goal: Knowledge of General Education information will improve Description: Including pain rating scale, medication(s)/side effects and non-pharmacologic comfort measures Outcome: Progressing

## 2022-03-22 NOTE — Progress Notes (Signed)
Progress Note  Patient Name: Leroy Simmons Date of Encounter: 03/22/2022  [facility] Cardiologist: Parke Poisson, MD   Subjective  Cath this admission showed 50% D2, 40% mid LAD, 25% ramus and severely elevated LVEDP at 33 mmHg with no evidence of AS.  Doing well with no chest pain or shortness of breath Inpatient Medications    Scheduled Meds:  amLODipine  5 mg Oral Daily   aspirin  81 mg Oral Daily   busPIRone  7.5 mg Oral BID   emtricitabine-tenofovir AF  1 tablet Oral Daily   enoxaparin (LOVENOX) injection  40 mg Subcutaneous Daily   furosemide  40 mg Intravenous BID   insulin aspart  0-20 Units Subcutaneous TID WC   insulin glargine-yfgn  16 Units Subcutaneous Daily   living well with diabetes book   Does not apply Once   metoprolol succinate  25 mg Oral Daily   rosuvastatin  20 mg Oral Daily   sertraline  25 mg Oral Daily   sodium chloride flush  3 mL Intravenous Q12H   sodium chloride flush  3 mL Intravenous Q12H   Continuous Infusions:  sodium chloride     PRN Meds: sodium chloride, acetaminophen, ondansetron (ZOFRAN) IV, mouth rinse, sodium chloride flush   Vital Signs    Vitals:   03/21/22 2030 03/21/22 2134 03/22/22 0018 03/22/22 0421  BP:  (!) 144/85 (!) 140/91 132/87  Pulse: 82 86 78 74  Resp: 14 19 12    Temp:  98.3 F (36.8 C) 98.1 F (36.7 C) 98.2 F (36.8 C)  TempSrc:  Oral Oral Oral  SpO2: 96% 98% 99% 95%  Weight:    94 kg  Height:        Intake/Output Summary (Last 24 hours) at 03/22/2022 0703 Last data filed at 03/22/2022 0550 Gross per 24 hour  Intake 840 ml  Output 300 ml  Net 540 ml       03/22/2022    4:21 AM 03/21/2022    4:17 AM 03/20/2022    5:00 AM  Last 3 Weights  Weight (lbs) 207 lb 3.7 oz 208 lb 8.9 oz 208 lb 15.9 oz  Weight (kg) 94 kg 94.6 kg 94.8 kg      Telemetry    Normal sinus rhythm-personally Reviewed  ECG    No new EKG to review- Personally Reviewed  Physical Exam   GEN: Well nourished, well  developed in no acute distress HEENT: Normal NECK: No JVD; No carotid bruits LYMPHATICS: No lymphadenopathy CARDIAC:RRR, no murmurs, rubs, gallops RESPIRATORY:  Clear to auscultation without rales, wheezing or rhonchi  ABDOMEN: Soft, non-tender, non-distended MUSCULOSKELETAL:  No edema; No deformity  SKIN: Warm and dry NEUROLOGIC:  Alert and oriented x 3 PSYCHIATRIC:  Normal affect    High Sensitivity Troponin:   Recent Labs  Lab 03/16/22 1945 03/16/22 2243 03/17/22 0932 03/17/22 1249  TROPONINIHS 30* 31* 32* 31*      Chemistry Recent Labs  Lab 03/16/22 1945 03/17/22 1249 03/19/22 0027 03/20/22 0051 03/21/22 0118 03/22/22 0127  NA 136   < > 134* 136 136 138  K 3.3*   < > 4.6 3.9 3.9 4.1  CL 97*   < > 95* 98 104 102  CO2 25   < > 28 25 23 26   GLUCOSE 366*   < > 243* 187* 270* 273*  BUN 25*   < > 34* 39* 33* 33*  CREATININE 1.54*   < > 1.57* 1.78* 1.23 1.28*  CALCIUM  9.3   < > 8.8* 9.1 8.6* 9.0  MG  --   --  2.4  --   --  2.3  PROT 7.4  --   --   --   --   --   ALBUMIN 3.5  --   --   --   --   --   AST 15  --   --   --   --   --   ALT 19  --   --   --   --   --   ALKPHOS 103  --   --   --   --   --   BILITOT 1.2  --   --   --   --   --   GFRNONAA 55*   < > 54* 46* >60 >60  ANIONGAP 14   < > 11 13 9 10    < > = values in this interval not displayed.     Lipids  Recent Labs  Lab 03/18/22 0434  CHOL 152  TRIG 153*  HDL 40*  LDLCALC 81  CHOLHDL 3.8     Hematology Recent Labs  Lab 03/16/22 1945 03/17/22 0932  WBC 19.0* 12.6*  RBC 4.43 4.24  HGB 13.7 13.4  HCT 40.0 38.4*  MCV 90.3 90.6  MCH 30.9 31.6  MCHC 34.3 34.9  RDW 11.4* 11.9  PLT 335 265    Thyroid No results for input(s): "TSH", "FREET4" in the last 168 hours.  BNP Recent Labs  Lab 03/16/22 1945  BNP 57.1     DDimer  Recent Labs  Lab 03/17/22 0932  DDIMER 0.40      Radiology    CARDIAC CATHETERIZATION  Result Date: 03/21/2022   2nd Diag lesion is 50% stenosed.   Mid LAD  lesion is 40% stenosed.   Ramus lesion is 25% stenosed.   LV end diastolic pressure is severely elevated.  LVEDP 33 mm Hg.   There is no aortic valve stenosis. Nonobstructive CAD.  Volume overload. Continue diuresis.    Cardiac Studies   Echocardiogram 03/17/22  1. Left ventricular ejection fraction, by estimation, is 55 to 60%. The  left ventricle has normal function. The left ventricle has no regional  wall motion abnormalities. Left ventricular diastolic parameters are  indeterminate.   2. Right ventricular systolic function is normal. The right ventricular  size is normal. Tricuspid regurgitation signal is inadequate for assessing  PA pressure.   3. The mitral valve is normal in structure. Trivial mitral valve  regurgitation. No evidence of mitral stenosis.   4. The aortic valve has an indeterminant number of cusps. Aortic valve  regurgitation is mild. No aortic stenosis is present.   5. The inferior vena cava is normal in size with greater than 50%  respiratory variability, suggesting right atrial pressure of 3 mmHg.   Patient Profile     51 y.o. male with a hx of HTN, DM, HLD, ? CHF  in past and anxiety who was seen 03/17/2022 for the evaluation of chest pain and CHF at the request of Dr. 03/19/2022.  Assessment & Plan    Acute on chronic HFpEF  - Patient reported having a history of heart failure, presented with SOB on exertion, leg swelling, and intermittent chest pain - Echo this admission showed EF 55-60%, indeterminate diastolic function  - Was taking lasix PTA, did report missing a day or two  - CXR showed no cardiopulmonary disease, BNP within normal limits  -  Cath This admission showed markedly elevated LVEDP at 33 mmHg with nonobstructive CAD - Currently on Lasix 40 mg IV twice daily - He put out 300 cc yesterday and is net -7.36 L - Weight down 1 lbs from yesterday and 9 lbsfrom admission - Renal function bumped slightly yesterday from 1.48->>1.57->>1.78 yesterday despite  holding ACE inhibitor.   - Diuretics held yesterday with improvement of serum creatinine from 1.78->>1.23 >> 1.28 today c - continue Lasix 40 mg IV twice daily - Follow strict I's and O's, daily weights and renal function while diuresing - Continue to hold ACE inhibitor, Jardiance while diuresing  Chest Pain  Troponin Elevation  - hsTn 30>>31>>32>>31. This is a very minimal elevation with a flat trend-- not consistent with ACS, suspect troponin leak in the setting of mild CHF exacerbation  -Coronary CTA this admission demonstrated coronary calcium score 431 with <25% distal RCA, 25 to 49% mixed proximal RCA stenosis, eccentric ostial LAD ostial LAD calcification and spotty proximal calcification with associated low-attenuation plaque with 70-99% stenosis with minimal mixed plaque in the proximal D1.  There is 25 to 49% noncalcified plaque in the proximal left circumflex -Coronary FFR did not show any significant hemodynamically flow-limiting lesion in the LAD by cardiac cath was recommended -Cath this admission showed  40% mid LAD, 25% ramus and 50% D2 -Continue aspirin 81 mg daily, Toprol-XL 25 mg daily and moderate dose statin therapy  HTN  -Diastolic BP has been running in the high 80s to 90s -Continue amlodipine 5 mg daily and increase Toprol-XL to 50 mg daily -Lisinopril on hold for rising serum creatinine with diuresis.   Otherwise per primary  - Poorly controlled Type 2 diabetes mellitus - AKI vs CKD   - Mild cellulitis of the right lower extremity >> on antibiotics per TRH - Depression/anxiety  - High risk sexual activity     I have spent a total of 35 minutes with patient reviewing coronary CTA, cardiac cath, 2D echo, telemetry, EKGs, labs and examining patient as well as establishing an assessment and plan that was discussed with the patient.  > 50% of time was spent in direct patient care.     For questions or updates, please contact [facility] Please consult [email or web address]  for contact info under     Signed, Armanda Magic, MD  03/22/2022, 7:03 AM

## 2022-03-23 DIAGNOSIS — N179 Acute kidney failure, unspecified: Secondary | ICD-10-CM | POA: Diagnosis not present

## 2022-03-23 DIAGNOSIS — I509 Heart failure, unspecified: Secondary | ICD-10-CM | POA: Diagnosis not present

## 2022-03-23 DIAGNOSIS — E1165 Type 2 diabetes mellitus with hyperglycemia: Secondary | ICD-10-CM | POA: Diagnosis not present

## 2022-03-23 DIAGNOSIS — I5033 Acute on chronic diastolic (congestive) heart failure: Secondary | ICD-10-CM | POA: Diagnosis not present

## 2022-03-23 DIAGNOSIS — I2510 Atherosclerotic heart disease of native coronary artery without angina pectoris: Secondary | ICD-10-CM | POA: Diagnosis not present

## 2022-03-23 DIAGNOSIS — L03115 Cellulitis of right lower limb: Secondary | ICD-10-CM | POA: Diagnosis not present

## 2022-03-23 LAB — LIPOPROTEIN A (LPA): Lipoprotein (a): 227.7 nmol/L — ABNORMAL HIGH (ref <75.0)

## 2022-03-23 LAB — BASIC METABOLIC PANEL
Anion gap: 10 (ref 5–15)
BUN: 36 mg/dL — ABNORMAL HIGH (ref 6–20)
CO2: 25 mmol/L (ref 22–32)
Calcium: 9.1 mg/dL (ref 8.9–10.3)
Chloride: 101 mmol/L (ref 98–111)
Creatinine, Ser: 1.34 mg/dL — ABNORMAL HIGH (ref 0.61–1.24)
GFR, Estimated: >60 mL/min (ref >60)
Glucose, Bld: 262 mg/dL — ABNORMAL HIGH (ref 70–99)
Potassium: 4.2 mmol/L (ref 3.5–5.1)
Sodium: 136 mmol/L (ref 135–145)

## 2022-03-23 LAB — GLUCOSE, CAPILLARY
Glucose-Capillary: 250 mg/dL — ABNORMAL HIGH (ref 70–99)
Glucose-Capillary: 268 mg/dL — ABNORMAL HIGH (ref 70–99)
Glucose-Capillary: 321 mg/dL — ABNORMAL HIGH (ref 70–99)
Glucose-Capillary: 182 mg/dL — ABNORMAL HIGH (ref 70–99)
Glucose-Capillary: 290 mg/dL — ABNORMAL HIGH (ref 70–99)

## 2022-03-23 LAB — MAGNESIUM: Magnesium: 2.1 mg/dL (ref 1.7–2.4)

## 2022-03-23 MED ORDER — FUROSEMIDE 40 MG PO TABS: 40.0000 mg | ORAL_TABLET | Freq: Two times a day (BID) | ORAL | Status: DC

## 2022-03-23 MED ORDER — INSULIN GLARGINE-YFGN 100 UNIT/ML ~~LOC~~ SOLN
18.0000 [IU] | Freq: Every day | SUBCUTANEOUS | Status: DC
Start: 2022-03-24 — End: 2022-03-24
  Administered 2022-03-24: 18 [IU] via SUBCUTANEOUS
  Filled 2022-03-23: qty 0.18

## 2022-03-23 NOTE — Progress Notes (Signed)
Subjective: Patient seen and evaluated at bedside.  States he is feeling fine.  Denies chest pain or shortness of breath.  Reports marked improvement in his right lower leg.  Denies any other complaints at this time.  Objective:  Vital signs in last 24 hours: Vitals:   03/23/22 0356 03/23/22 0539 03/23/22 0933 03/23/22 1122  BP: (!) 143/99  139/90 116/84  Pulse: 76  79 77  Resp: 13   12  Temp: 97.8 F (36.6 C)   97.8 F (36.6 C)  TempSrc: Oral   Oral  SpO2: 95%   95%  Weight:  95 kg    Height:       Physical Exam Neck:     Vascular: No JVD.  Cardiovascular:     Rate and Rhythm: Normal rate.     Heart sounds: Normal heart sounds.  Pulmonary:     Effort: Pulmonary effort is normal.     Breath sounds: Normal breath sounds and air entry.  Abdominal:     Palpations: Abdomen is soft.  Musculoskeletal:     Right lower leg: No edema.     Left lower leg: No edema.  Skin:    General: Skin is warm and dry.  Neurological:     General: No focal deficit present.     Mental Status: He is alert.  Psychiatric:        Mood and Affect: Mood normal.        Behavior: Behavior normal. Behavior is cooperative.        Cognition and Memory: Cognition normal.      Assessment/Plan:  Principal Problem:   CHF exacerbation (HCC) Active Problems:   Essential hypertension   Diabetes mellitus (HCC)   Cellulitis of right lower extremity   AKI (acute kidney injury) (HCC)   Coronary atherosclerosis of native coronary artery   Acute exacerbation of CHF (congestive heart failure) (HCC)  Leroy Simmons is a 51 y.o. with a pertinent PMH of HFpEF, type 2 diabetes, hypertension, who presented with acute chest tightness and admitted for acute decompensated HFpEF exacerbation.   Acute on chronic HFpEF exacerbation, resolving Patient continued on IV Lasix 40 twice daily.  Overnight net output 1.5 L; 8.8 L since admission.  Patient around his dry weight of 209 pounds.  Patient will be transition  to oral Lasix tomorrow.  Patient noted to have a slight rise in creatinine 1.24-1.38, likely in the setting of diuresis.  On exam, no JVD, lungs clear to auscultation and no lower extremity edema noted.  Patient appears euvolemic. -Oral Lasix 40 mg twice daily starting tomorrow --Strict I's and O's --Daily weights --Daily BMP --K+ > 4 and Mg >2  CAD with stenosis of proximal LAD LHC obtained during this hospitalization revealed nonobstructive CAD. -ASA 81mg  daily -Toprol-XL 25mg  daily -Crestor 20 daily  Hypertension -Amlodipine 5mg  daily -Toprol XL 25mg  daily  Type II DM A1c obtained on admission, 9.8%.  Glucose ranges have not been at goal, elevated in the high 200s.  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   , MD 03/23/2022, 2:15 PM Pager: [phone] After 5pm on weekdays and 1pm on  weekends: On Call pager [phone]

## 2022-03-23 NOTE — Progress Notes (Signed)
Inpatient Diabetes Program Recommendations  AACE/ADA: New Consensus Statement on Inpatient Glycemic Control (2015)  Target Ranges:  Prepandial:   less than 140 mg/dL      Peak postprandial:   less than 180 mg/dL (1-2 hours)      Critically ill patients:  140 - 180 mg/dL   Lab Results  Component Value Date   GLUCAP 250 (H) 03/23/2022   HGBA1C 9.8 (H) 03/18/2022    Review of Glycemic Control  Latest Reference Range & Units 03/22/22 06:25 03/22/22 11:38 03/22/22 15:54 03/22/22 21:17 03/23/22 06:19  Glucose-Capillary 70 - 99 mg/dL 875 (H) 643 (H) 329 (H) 281 (H) 250 (H)   Diabetes history: DM 2 Outpatient Diabetes medications:  Semglee 10 units daily, Jardiance 10 mg q AM, Metformin 2000 mg q HS Current orders for Inpatient glycemic control:  Novolog resistant tid with meals Semglee 16 units daily  Inpatient Diabetes Program Recommendations:   Please consider increasing Semglee to 22 units daily.   Thanks,  Beryl Meager, RN, BC-ADM Inpatient Diabetes Coordinator Pager [phone]  (8a-5p)

## 2022-03-23 NOTE — Progress Notes (Addendum)
Progress Note  Patient Name: Leroy Simmons Date of Encounter: 03/23/2022  [facility] Cardiologist: Parke Poisson, MD   Subjective  Denies any chest pain or SOB.  Cath this admission showed 50% D2, 40% mid LAD, 25% ramus and severely elevated LVEDP at 33 mmHg with no evidence of AS.  He put out 2.23L yesterday and neg -9L since admit.  Inpatient Medications    Scheduled Meds:  amLODipine  5 mg Oral Daily   aspirin  81 mg Oral Daily   busPIRone  7.5 mg Oral BID   emtricitabine-tenofovir AF  1 tablet Oral Daily   enoxaparin (LOVENOX) injection  40 mg Subcutaneous Daily   furosemide  40 mg Intravenous BID   insulin aspart  0-20 Units Subcutaneous TID WC   insulin glargine-yfgn  16 Units Subcutaneous Daily   living well with diabetes book   Does not apply Once   metoprolol succinate  50 mg Oral Daily   rosuvastatin  20 mg Oral Daily   sertraline  25 mg Oral Daily   sodium chloride flush  3 mL Intravenous Q12H   sodium chloride flush  3 mL Intravenous Q12H   Continuous Infusions:  sodium chloride     PRN Meds: sodium chloride, acetaminophen, ondansetron (ZOFRAN) IV, mouth rinse, sodium chloride flush   Vital Signs    Vitals:   03/22/22 2337 03/23/22 0356 03/23/22 0539 03/23/22 0933  BP: 121/81 (!) 143/99  139/90  Pulse: 75 76  79  Resp: 14 13    Temp: 98.1 F (36.7 C) 97.8 F (36.6 C)    TempSrc: Oral Oral    SpO2: 96% 95%    Weight:   95 kg   Height:        Intake/Output Summary (Last 24 hours) at 03/23/2022 0942 Last data filed at 03/22/2022 2338 Gross per 24 hour  Intake 480 ml  Output 1125 ml  Net -645 ml       03/23/2022    5:39 AM 03/22/2022    4:21 AM 03/21/2022    4:17 AM  Last 3 Weights  Weight (lbs) 209 lb 7 oz 207 lb 3.7 oz 208 lb 8.9 oz  Weight (kg) 95 kg 94 kg 94.6 kg      Telemetry    NSR-personally Reviewed  ECG    No new EKG to review- Personally Reviewed  Physical Exam  GEN: Well nourished, well developed in no acute  distress HEENT: Normal NECK: No JVD; No carotid bruits LYMPHATICS: No lymphadenopathy CARDIAC:RRR, no murmurs, rubs, gallops RESPIRATORY:  Clear to auscultation without rales, wheezing or rhonchi  ABDOMEN: Soft, non-tender, non-distended MUSCULOSKELETAL:  No edema; No deformity  SKIN: Warm and dry NEUROLOGIC:  Alert and oriented x 3 PSYCHIATRIC:  Normal affect    High Sensitivity Troponin:   Recent Labs  Lab 03/16/22 1945 03/16/22 2243 03/17/22 0932 03/17/22 1249  TROPONINIHS 30* 31* 32* 31*      Chemistry Recent Labs  Lab 03/16/22 1945 03/17/22 1249 03/19/22 0027 03/20/22 0051 03/21/22 0118 03/22/22 0127 03/23/22 0508  NA 136   < > 134*   < > 136 138 136  K 3.3*   < > 4.6   < > 3.9 4.1 4.2  CL 97*   < > 95*   < > 104 102 101  CO2 25   < > 28   < > 23 26 25   GLUCOSE 366*   < > 243*   < > 270* 273* 262*  BUN 25*   < > 34*   < > 33* 33* 36*  CREATININE 1.54*   < > 1.57*   < > 1.23 1.28* 1.34*  CALCIUM 9.3   < > 8.8*   < > 8.6* 9.0 9.1  MG  --   --  2.4  --   --  2.3 2.1  PROT 7.4  --   --   --   --   --   --   ALBUMIN 3.5  --   --   --   --   --   --   AST 15  --   --   --   --   --   --   ALT 19  --   --   --   --   --   --   ALKPHOS 103  --   --   --   --   --   --   BILITOT 1.2  --   --   --   --   --   --   GFRNONAA 55*   < > 54*   < > >60 >60 >60  ANIONGAP 14   < > 11   < > 9 10 10    < > = values in this interval not displayed.     Lipids  Recent Labs  Lab 03/18/22 0434  CHOL 152  TRIG 153*  HDL 40*  LDLCALC 81  CHOLHDL 3.8     Hematology Recent Labs  Lab 03/16/22 1945 03/17/22 0932  WBC 19.0* 12.6*  RBC 4.43 4.24  HGB 13.7 13.4  HCT 40.0 38.4*  MCV 90.3 90.6  MCH 30.9 31.6  MCHC 34.3 34.9  RDW 11.4* 11.9  PLT 335 265    Thyroid No results for input(s): "TSH", "FREET4" in the last 168 hours.  BNP Recent Labs  Lab 03/16/22 1945  BNP 57.1     DDimer  Recent Labs  Lab 03/17/22 0932  DDIMER 0.40      Radiology    CARDIAC  CATHETERIZATION  Result Date: 03/21/2022   2nd Diag lesion is 50% stenosed.   Mid LAD lesion is 40% stenosed.   Ramus lesion is 25% stenosed.   LV end diastolic pressure is severely elevated.  LVEDP 33 mm Hg.   There is no aortic valve stenosis. Nonobstructive CAD.  Volume overload. Continue diuresis.    Cardiac Studies   Echocardiogram 03/17/22  1. Left ventricular ejection fraction, by estimation, is 55 to 60%. The  left ventricle has normal function. The left ventricle has no regional  wall motion abnormalities. Left ventricular diastolic parameters are  indeterminate.   2. Right ventricular systolic function is normal. The right ventricular  size is normal. Tricuspid regurgitation signal is inadequate for assessing  PA pressure.   3. The mitral valve is normal in structure. Trivial mitral valve  regurgitation. No evidence of mitral stenosis.   4. The aortic valve has an indeterminant number of cusps. Aortic valve  regurgitation is mild. No aortic stenosis is present.   5. The inferior vena cava is normal in size with greater than 50%  respiratory variability, suggesting right atrial pressure of 3 mmHg.   Patient Profile     51 y.o. male with a hx of HTN, DM, HLD, ? CHF  in past and anxiety who was seen 03/17/2022 for the evaluation of chest pain and CHF at the request of Dr. 03/19/2022.  Assessment & Plan  Acute on chronic HFpEF  - Patient reported having a history of heart failure, presented with SOB on exertion, leg swelling, and intermittent chest pain - Echo this admission showed EF 55-60%, indeterminate diastolic function  - Was taking lasix PTA, did report missing a day or two  - CXR showed no cardiopulmonary disease, BNP within normal limits  - Cath This admission showed markedly elevated LVEDP at 33 mmHg with nonobstructive CAD - Currently on Lasix 40 mg IV twice daily - He put out 2.23L yesterday and net - 9L since admit - Weight up 1 lb from yesterday and 7 lbs from  admission - Renal function bumped slightly yesterday from 1.48->>1.57->>1.78 yesterday despite holding ACE inhibitor.   - Diuretics held yesterday with improvement of serum creatinine from 1.78->>1.23 >> 1.28 >> 1.34 today - SCr has bumped so likely has reached euvolemia. - change IV lasix to lasix 40mg  PO BID starting tomorrow since he already got 40mg  IV this am - Follow strict I's and O's, daily weights and renal function while diuresing - Continue to hold ACE inhibitor, Jardiance while diuresing>>if renal function remains stable will restart ACE I and Jardiance tomorrow  Chest Pain  Troponin Elevation  - hsTn 30>>31>>32>>31. This is a very minimal elevation with a flat trend-- not consistent with ACS, suspect troponin leak in the setting of mild CHF exacerbation  -Coronary CTA this admission demonstrated coronary calcium score 431 with <25% distal RCA, 25 to 49% mixed proximal RCA stenosis, eccentric ostial LAD ostial LAD calcification and spotty proximal calcification with associated low-attenuation plaque with 70-99% stenosis with minimal mixed plaque in the proximal D1.  There is 25 to 49% noncalcified plaque in the proximal left circumflex -Coronary FFR did not show any significant hemodynamically flow-limiting lesion in the LAD by cardiac cath was recommended -Cath this admission showed  40% mid LAD, 25% ramus and 50% D2 -Continue aspirin 81 mg daily, Toprol-XL 25 mg daily and moderate dose statin therapy  HTN  -Diastolic BP has been running in the high 80s to 90s -Continue amlodipine 5 mg daily and Toprol XL 50mg  daily -Lisinopril on hold for rising serum creatinine with diuresis which has improved>>will restart tomorrow if SCr remains stable in am  Otherwise per primary  - Poorly controlled Type 2 diabetes mellitus - AKI vs CKD   - Mild cellulitis of the right lower extremity >> on antibiotics per TRH - Depression/anxiety  - High risk sexual activity     I have spent a total of  35 minutes with patient reviewing coronary CTA, cardiac cath, 2D echo, telemetry, EKGs, labs and examining patient as well as establishing an assessment and plan that was discussed with the patient.  > 50% of time was spent in direct patient care.     For questions or updates, please contact [facility] Please consult [email or web address] for contact info under     Signed, , MD  03/23/2022, 9:42 AM

## 2022-03-24 ENCOUNTER — Other Ambulatory Visit: Payer: Self-pay | Admitting: Physician Assistant

## 2022-03-24 DIAGNOSIS — N179 Acute kidney failure, unspecified: Secondary | ICD-10-CM

## 2022-03-24 DIAGNOSIS — L03115 Cellulitis of right lower limb: Secondary | ICD-10-CM | POA: Diagnosis not present

## 2022-03-24 DIAGNOSIS — I2510 Atherosclerotic heart disease of native coronary artery without angina pectoris: Secondary | ICD-10-CM | POA: Diagnosis not present

## 2022-03-24 DIAGNOSIS — I509 Heart failure, unspecified: Secondary | ICD-10-CM | POA: Diagnosis not present

## 2022-03-24 LAB — BASIC METABOLIC PANEL
Anion gap: 10 (ref 5–15)
BUN: 36 mg/dL — ABNORMAL HIGH (ref 6–20)
CO2: 25 mmol/L (ref 22–32)
Calcium: 9 mg/dL (ref 8.9–10.3)
Chloride: 98 mmol/L (ref 98–111)
Creatinine, Ser: 1.51 mg/dL — ABNORMAL HIGH (ref 0.61–1.24)
GFR, Estimated: 57 mL/min — ABNORMAL LOW (ref >60)
Glucose, Bld: 287 mg/dL — ABNORMAL HIGH (ref 70–99)
Potassium: 4.1 mmol/L (ref 3.5–5.1)
Sodium: 133 mmol/L — ABNORMAL LOW (ref 135–145)

## 2022-03-24 LAB — GLUCOSE, CAPILLARY
Glucose-Capillary: 241 mg/dL — ABNORMAL HIGH (ref 70–99)
Glucose-Capillary: 282 mg/dL — ABNORMAL HIGH (ref 70–99)

## 2022-03-24 LAB — MAGNESIUM: Magnesium: 2 mg/dL (ref 1.7–2.4)

## 2022-03-24 MED ORDER — PNEUMOCOCCAL 20-VAL CONJ VACC 0.5 ML IM SUSY
0.5000 mL | PREFILLED_SYRINGE | Freq: Once | INTRAMUSCULAR | Status: AC
  Administered 2022-03-24: 0.5 mL via INTRAMUSCULAR
  Filled 2022-03-24: qty 0.5

## 2022-03-24 MED ORDER — FUROSEMIDE 40 MG PO TABS: 40.0000 mg | ORAL_TABLET | Freq: Two times a day (BID) | ORAL | 0 refills | Status: DC

## 2022-03-24 MED ORDER — PNEUMOCOCCAL 20-VAL CONJ VACC 0.5 ML IM SUSY: 0.5000 mL | PREFILLED_SYRINGE | INTRAMUSCULAR | Status: DC

## 2022-03-24 MED ORDER — METOPROLOL SUCCINATE ER 50 MG PO TB24: 50.0000 mg | ORAL_TABLET | Freq: Every day | ORAL | 0 refills | Status: DC

## 2022-03-24 MED ORDER — INSULIN GLARGINE-YFGN 100 UNIT/ML ~~LOC~~ SOPN: 22.0000 [IU] | PEN_INJECTOR | Freq: Every day | SUBCUTANEOUS | 1 refills | Status: DC

## 2022-03-24 MED ORDER — ROSUVASTATIN CALCIUM 20 MG PO TABS: 20.0000 mg | ORAL_TABLET | Freq: Every day | ORAL | 0 refills | Status: DC

## 2022-03-24 MED ORDER — ASPIRIN 81 MG PO CHEW: 81.0000 mg | CHEWABLE_TABLET | Freq: Every day | ORAL | 0 refills | Status: AC

## 2022-03-24 NOTE — Discharge Instructions (Signed)
Please do not take lisinopril until you follow up with your primary care doctor and cardiologist. Your insulin has been increased to 22 units daily. Also continue taking your Jardiance and metformin daily.

## 2022-03-24 NOTE — Progress Notes (Signed)
Progress Note  Patient Name: Leroy Simmons Date of Encounter: 03/24/2022  [facility] Cardiologist: Parke Poisson, MD   Subjective  No CP or SOB. He put out 2.1L yesterday and is net neg 9.9L since admit.    Inpatient Medications    Scheduled Meds:  amLODipine  5 mg Oral Daily   aspirin  81 mg Oral Daily   busPIRone  7.5 mg Oral BID   emtricitabine-tenofovir AF  1 tablet Oral Daily   enoxaparin (LOVENOX) injection  40 mg Subcutaneous Daily   furosemide  40 mg Oral BID   insulin aspart  0-20 Units Subcutaneous TID WC   insulin glargine-yfgn  18 Units Subcutaneous Daily   metoprolol succinate  50 mg Oral Daily   rosuvastatin  20 mg Oral Daily   sertraline  25 mg Oral Daily   sodium chloride flush  3 mL Intravenous Q12H   sodium chloride flush  3 mL Intravenous Q12H   Continuous Infusions:  sodium chloride     PRN Meds: sodium chloride, acetaminophen, ondansetron (ZOFRAN) IV, mouth rinse, sodium chloride flush   Vital Signs    Vitals:   03/23/22 1907 03/23/22 2329 03/24/22 0317 03/24/22 0500  BP: 137/87 124/83 126/89   Pulse: 86 75 78   Resp: 18 14 19    Temp: 97.6 F (36.4 C) 98.6 F (37 C) 98.2 F (36.8 C)   TempSrc: Axillary Oral Oral   SpO2: 99% 95% 96%   Weight:    94.1 kg  Height:        Intake/Output Summary (Last 24 hours) at 03/24/2022 0826 Last data filed at 03/23/2022 2300 Gross per 24 hour  Intake 1076 ml  Output 2150 ml  Net -1074 ml       03/24/2022    5:00 AM 03/23/2022    5:39 AM 03/22/2022    4:21 AM  Last 3 Weights  Weight (lbs) 207 lb 7.3 oz 209 lb 7 oz 207 lb 3.7 oz  Weight (kg) 94.1 kg 95 kg 94 kg      Telemetry    NSR-personally Reviewed  ECG    No new EKG to review- Personally Reviewed  Physical Exam  GEN: Well nourished, well developed in no acute distress HEENT: Normal NECK: No JVD; No carotid bruits LYMPHATICS: No lymphadenopathy CARDIAC:RRR, no murmurs, rubs, gallops RESPIRATORY:  Clear to auscultation  without rales, wheezing or rhonchi  ABDOMEN: Soft, non-tender, non-distended MUSCULOSKELETAL:  No edema; No deformity  SKIN: Warm and dry NEUROLOGIC:  Alert and oriented x 3 PSYCHIATRIC:  Normal affect  High Sensitivity Troponin:   Recent Labs  Lab 03/16/22 1945 03/16/22 2243 03/17/22 0932 03/17/22 1249  TROPONINIHS 30* 31* 32* 31*      Chemistry Recent Labs  Lab 03/22/22 0127 03/23/22 0508 03/24/22 0022  NA 138 136 133*  K 4.1 4.2 4.1  CL 102 101 98  CO2 26 25 25   GLUCOSE 273* 262* 287*  BUN 33* 36* 36*  CREATININE 1.28* 1.34* 1.51*  CALCIUM 9.0 9.1 9.0  MG 2.3 2.1 2.0  GFRNONAA >60 >60 57*  ANIONGAP 10 10 10      Lipids  Recent Labs  Lab 03/18/22 0434  CHOL 152  TRIG 153*  HDL 40*  LDLCALC 81  CHOLHDL 3.8     Hematology Recent Labs  Lab 03/17/22 0932  WBC 12.6*  RBC 4.24  HGB 13.4  HCT 38.4*  MCV 90.6  MCH 31.6  MCHC 34.9  RDW 11.9  PLT 265  Thyroid No results for input(s): "TSH", "FREET4" in the last 168 hours.  BNP No results for input(s): "BNP", "PROBNP" in the last 168 hours.   DDimer  Recent Labs  Lab 03/17/22 0932  DDIMER 0.40      Radiology    No results found.  Cardiac Studies   Echocardiogram 03/17/22  1. Left ventricular ejection fraction, by estimation, is 55 to 60%. The  left ventricle has normal function. The left ventricle has no regional  wall motion abnormalities. Left ventricular diastolic parameters are  indeterminate.   2. Right ventricular systolic function is normal. The right ventricular  size is normal. Tricuspid regurgitation signal is inadequate for assessing  PA pressure.   3. The mitral valve is normal in structure. Trivial mitral valve  regurgitation. No evidence of mitral stenosis.   4. The aortic valve has an indeterminant number of cusps. Aortic valve  regurgitation is mild. No aortic stenosis is present.   5. The inferior vena cava is normal in size with greater than 50%  respiratory  variability, suggesting right atrial pressure of 3 mmHg.   Patient Profile     51 y.o. male with a hx of HTN, DM, HLD, ? CHF  in past and anxiety who was seen 03/17/2022 for the evaluation of chest pain and CHF at the request of Dr. Manus Gunning.  Assessment & Plan    Acute on chronic HFpEF  - Patient reported having a history of heart failure, presented with SOB on exertion, leg swelling, and intermittent chest pain - Echo this admission showed EF 55-60%, indeterminate diastolic function  - Was taking lasix PTA, did report missing a day or two  - CXR showed no cardiopulmonary disease, BNP within normal limits  - Cath This admission showed markedly elevated LVEDP at 33 mmHg with nonobstructive CAD - He put out 2.15L yesterday and is net neg 9.9L  since admit - Weight down 2lbs  from yesterday and 9 lbs from admission - serum creatinine bumped from 1.78->>1.23 >> 1.28 >> 1.34 >> 1.51 today (got Lasix 40mg  IV yesterday) - SCr has bumped so likely has reached euvolemia. - Hold diuretics today and start lasix 40mg  BID tomorrow if renal function improved - Follow strict I's and O's, daily weights and renal function while diuresing - Continue to hold ACE inhibitor, Jardiance while diuresing  Chest Pain  Troponin Elevation  - hsTn 30>>31>>32>>31. This is a very minimal elevation with a flat trend-- not consistent with ACS, suspect troponin leak in the setting of mild CHF exacerbation  -Coronary CTA this admission demonstrated coronary calcium score 431 with <25% distal RCA, 25 to 49% mixed proximal RCA stenosis, eccentric ostial LAD ostial LAD calcification and spotty proximal calcification with associated low-attenuation plaque with 70-99% stenosis with minimal mixed plaque in the proximal D1.  There is 25 to 49% noncalcified plaque in the proximal left circumflex -Coronary FFR did not show any significant hemodynamically flow-limiting lesion in the LAD by cardiac cath was recommended -Cath this  admission showed  40% mid LAD, 25% ramus and 50% D2 -Continue aspirin 81 mg daily, Toprol-XL 25 mg daily and moderate dose statin therapy  HTN  -BP controlled -Continue amlodipine 5 mg daily and Toprol XL 50mg  daily -Lisinopril on hold for rising serum creatinine with diuresis   Otherwise per primary  - Poorly controlled Type 2 diabetes mellitus - AKI vs CKD   - Mild cellulitis of the right lower extremity >> on antibiotics per [facility] - Depression/anxiety  -  High risk sexual activity     I have spent a total of 35 minutes with patient reviewing coronary CTA, cardiac cath, 2D echo, telemetry, EKGs, labs and examining patient as well as establishing an assessment and plan that was discussed with the patient.  > 50% of time was spent in direct patient care.     For questions or updates, please contact [facility] Please consult [email or web address] for contact info under     Signed, Armanda Magic, MD  03/24/2022, 8:26 AM

## 2022-03-24 NOTE — Progress Notes (Addendum)
Inpatient Diabetes Program Recommendations  AACE/ADA: New Consensus Statement on Inpatient Glycemic Control (2015)  Target Ranges:  Prepandial:   less than 140 mg/dL      Peak postprandial:   less than 180 mg/dL (1-2 hours)      Critically ill patients:  140 - 180 mg/dL   Lab Results  Component Value Date   GLUCAP 241 (H) 03/24/2022   HGBA1C 9.8 (H) 03/18/2022    Review of Glycemic Control  Latest Reference Range & Units 03/23/22 11:20 03/23/22 11:41 03/23/22 16:37 03/23/22 21:17 03/24/22 06:24  Glucose-Capillary 70 - 99 mg/dL 950 (H) 932 (H) 671 (H) 290 (H) 241 (H)  Diabetes history: DM 2 Outpatient Diabetes medications:  Semglee 10 units daily, Jardiance 10 mg q AM, Metformin 2000 mg q HS Current orders for Inpatient glycemic control:  Novolog resistant tid with meals Semglee 18 units daily Inpatient Diabetes Program Recommendations:    Fasting Blood sugar=241 mg/dL- please consider increasing Semglee to 24 units q HS.   Thanks,  Beryl Meager, RN, BC-ADM Inpatient Diabetes Coordinator Pager [phone]  (8a-5p)  1400- addendum-Attempted to start [facility] prior to patient being discharged.  Patient has android phone that is not compatible with [employer].  Therefore he will need Freestyle libre reader as well as sensor in order for sensor to work.  Gave patient packet and asked him to f/u with PCP regarding training, and prescription for sensor.

## 2022-03-24 NOTE — Plan of Care (Signed)
  Problem: Education: Goal: Understanding of cardiac disease, CV risk reduction, and recovery process will improve Outcome: Progressing Goal: Individualized Educational Video(s) Outcome: Progressing   Problem: Activity: Goal: Ability to tolerate increased activity will improve Outcome: Progressing   Problem: Cardiac: Goal: Ability to achieve and maintain adequate cardiovascular perfusion will improve Outcome: Progressing   Problem: Health Behavior/Discharge Planning: Goal: Ability to safely manage health-related needs after discharge will improve Outcome: Progressing   Problem: Education: Goal: Ability to describe self-care measures that may prevent or decrease complications (Diabetes Survival Skills Education) will improve Outcome: Progressing Goal: Individualized Educational Video(s) Outcome: Progressing   Problem: Coping: Goal: Ability to adjust to condition or change in health will improve Outcome: Progressing   Problem: Fluid Volume: Goal: Ability to maintain a balanced intake and output will improve Outcome: Progressing   Problem: Health Behavior/Discharge Planning: Goal: Ability to identify and utilize available resources and services will improve Outcome: Progressing Goal: Ability to manage health-related needs will improve Outcome: Progressing   Problem: Metabolic: Goal: Ability to maintain appropriate glucose levels will improve Outcome: Progressing   Problem: Nutritional: Goal: Maintenance of adequate nutrition will improve Outcome: Progressing Goal: Progress toward achieving an optimal weight will improve Outcome: Progressing   Problem: Skin Integrity: Goal: Risk for impaired skin integrity will decrease Outcome: Progressing   Problem: Tissue Perfusion: Goal: Adequacy of tissue perfusion will improve Outcome: Progressing   Problem: Education: Goal: Knowledge of General Education information will improve Description: Including pain rating scale,  medication(s)/side effects and non-pharmacologic comfort measures Outcome: Progressing   Problem: Health Behavior/Discharge Planning: Goal: Ability to manage health-related needs will improve Outcome: Progressing   Problem: Clinical Measurements: Goal: Ability to maintain clinical measurements within normal limits will improve Outcome: Progressing Goal: Will remain free from infection Outcome: Progressing Goal: Diagnostic test results will improve Outcome: Progressing Goal: Respiratory complications will improve Outcome: Progressing Goal: Cardiovascular complication will be avoided Outcome: Progressing   Problem: Activity: Goal: Risk for activity intolerance will decrease Outcome: Progressing   Problem: Nutrition: Goal: Adequate nutrition will be maintained Outcome: Progressing   Problem: Coping: Goal: Level of anxiety will decrease Outcome: Progressing   Problem: Elimination: Goal: Will not experience complications related to bowel motility Outcome: Progressing Goal: Will not experience complications related to urinary retention Outcome: Progressing   Problem: Pain Managment: Goal: General experience of comfort will improve Outcome: Progressing   Problem: Safety: Goal: Ability to remain free from injury will improve Outcome: Progressing   Problem: Skin Integrity: Goal: Risk for impaired skin integrity will decrease Outcome: Progressing   Problem: Education: Goal: Understanding of CV disease, CV risk reduction, and recovery process will improve Outcome: Progressing Goal: Individualized Educational Video(s) Outcome: Progressing   Problem: Activity: Goal: Ability to return to baseline activity level will improve Outcome: Progressing   Problem: Cardiovascular: Goal: Ability to achieve and maintain adequate cardiovascular perfusion will improve Outcome: Progressing Goal: Vascular access site(s) Level 0-1 will be maintained Outcome: Progressing   Problem:  Health Behavior/Discharge Planning: Goal: Ability to safely manage health-related needs after discharge will improve Outcome: Progressing   

## 2022-03-29 ENCOUNTER — Encounter: Payer: Self-pay | Admitting: Nurse Practitioner

## 2022-03-29 ENCOUNTER — Ambulatory Visit (INDEPENDENT_AMBULATORY_CARE_PROVIDER_SITE_OTHER): Payer: 59 | Admitting: Nurse Practitioner

## 2022-03-29 VITALS — BP 120/58 | HR 87 | Ht 67.0 in | Wt 218.2 lb

## 2022-03-29 DIAGNOSIS — I2510 Atherosclerotic heart disease of native coronary artery without angina pectoris: Secondary | ICD-10-CM

## 2022-03-29 DIAGNOSIS — I5032 Chronic diastolic (congestive) heart failure: Secondary | ICD-10-CM | POA: Diagnosis not present

## 2022-03-29 DIAGNOSIS — I10 Essential (primary) hypertension: Secondary | ICD-10-CM

## 2022-03-29 DIAGNOSIS — N179 Acute kidney failure, unspecified: Secondary | ICD-10-CM

## 2022-03-29 DIAGNOSIS — Z794 Long term (current) use of insulin: Secondary | ICD-10-CM

## 2022-03-29 DIAGNOSIS — E118 Type 2 diabetes mellitus with unspecified complications: Secondary | ICD-10-CM

## 2022-03-29 LAB — BASIC METABOLIC PANEL
BUN/Creatinine Ratio: 25 — ABNORMAL HIGH (ref 9–20)
BUN: 29 mg/dL — ABNORMAL HIGH (ref 6–24)
CO2: 23 mmol/L (ref 20–29)
Calcium: 9.8 mg/dL (ref 8.7–10.2)
Chloride: 102 mmol/L (ref 96–106)
Creatinine, Ser: 1.14 mg/dL (ref 0.76–1.27)
Glucose: 264 mg/dL — ABNORMAL HIGH (ref 70–99)
Potassium: 4.9 mmol/L (ref 3.5–5.2)
Sodium: 139 mmol/L (ref 134–144)
eGFR: 79 mL/min/{1.73_m2} (ref >59)

## 2022-03-29 MED ORDER — POTASSIUM CHLORIDE CRYS ER 10 MEQ PO TBCR: 10.0000 meq | EXTENDED_RELEASE_TABLET | Freq: Two times a day (BID) | ORAL | 3 refills | Status: DC

## 2022-03-29 MED ORDER — TORSEMIDE 20 MG PO TABS: 20.0000 mg | ORAL_TABLET | Freq: Two times a day (BID) | ORAL | 3 refills | Status: DC

## 2022-03-29 MED ORDER — ISOSORBIDE MONONITRATE ER 30 MG PO TB24: 15.0000 mg | ORAL_TABLET | Freq: Every day | ORAL | 3 refills | Status: DC

## 2022-03-31 ENCOUNTER — Ambulatory Visit (HOSPITAL_BASED_OUTPATIENT_CLINIC_OR_DEPARTMENT_OTHER): Payer: Self-pay | Admitting: Family Medicine

## 2022-04-04 ENCOUNTER — Telehealth: Payer: Self-pay

## 2022-04-04 ENCOUNTER — Encounter: Payer: 59 | Admitting: Physician Assistant

## 2022-04-04 NOTE — Telephone Encounter (Signed)
Spoke with pt. Pt was notified of lab results. Pt will continue his current medications and follow up as planned.  

## 2022-04-06 NOTE — Progress Notes (Signed)
This encounter was created in error - please disregard.

## 2022-04-11 ENCOUNTER — Encounter: Payer: Self-pay | Admitting: Physician Assistant

## 2022-04-17 ENCOUNTER — Emergency Department (HOSPITAL_COMMUNITY): Payer: 59

## 2022-04-17 ENCOUNTER — Encounter (HOSPITAL_COMMUNITY): Payer: Self-pay | Admitting: Emergency Medicine

## 2022-04-17 ENCOUNTER — Other Ambulatory Visit: Payer: Self-pay

## 2022-04-17 ENCOUNTER — Emergency Department (HOSPITAL_COMMUNITY)
Admission: EM | Admit: 2022-04-17 | Discharge: 2022-04-17 | Disposition: A | Discharge status: Home / Self Care | Payer: 59 | Attending: Emergency Medicine | Admitting: Emergency Medicine

## 2022-04-17 DIAGNOSIS — R0602 Shortness of breath: Secondary | ICD-10-CM | POA: Insufficient documentation

## 2022-04-17 DIAGNOSIS — Z7982 Long term (current) use of aspirin: Secondary | ICD-10-CM | POA: Insufficient documentation

## 2022-04-17 DIAGNOSIS — R0600 Dyspnea, unspecified: Secondary | ICD-10-CM

## 2022-04-17 DIAGNOSIS — Z794 Long term (current) use of insulin: Secondary | ICD-10-CM | POA: Insufficient documentation

## 2022-04-17 DIAGNOSIS — I509 Heart failure, unspecified: Secondary | ICD-10-CM | POA: Insufficient documentation

## 2022-04-17 DIAGNOSIS — Z79899 Other long term (current) drug therapy: Secondary | ICD-10-CM | POA: Insufficient documentation

## 2022-04-17 DIAGNOSIS — Z7984 Long term (current) use of oral hypoglycemic drugs: Secondary | ICD-10-CM | POA: Insufficient documentation

## 2022-04-17 LAB — COMPREHENSIVE METABOLIC PANEL
ALT: 18 U/L (ref 0–44)
AST: 18 U/L (ref 15–41)
Albumin: 3.4 g/dL — ABNORMAL LOW (ref 3.5–5.0)
Alkaline Phosphatase: 90 U/L (ref 38–126)
Anion gap: 10 (ref 5–15)
BUN: 21 mg/dL — ABNORMAL HIGH (ref 6–20)
CO2: 25 mmol/L (ref 22–32)
Calcium: 8.4 mg/dL — ABNORMAL LOW (ref 8.9–10.3)
Chloride: 102 mmol/L (ref 98–111)
Creatinine, Ser: 1.05 mg/dL (ref 0.61–1.24)
GFR, Estimated: >60 mL/min (ref >60)
Glucose, Bld: 252 mg/dL — ABNORMAL HIGH (ref 70–99)
Potassium: 3.8 mmol/L (ref 3.5–5.1)
Sodium: 137 mmol/L (ref 135–145)
Total Bilirubin: 0.5 mg/dL (ref 0.3–1.2)
Total Protein: 6.5 g/dL (ref 6.5–8.1)

## 2022-04-17 LAB — CBC WITH DIFFERENTIAL/PLATELET
Abs Immature Granulocytes: 0.01 10*3/uL (ref 0.00–0.07)
Basophils Absolute: 0 10*3/uL (ref 0.0–0.1)
Basophils Relative: 0 %
Eosinophils Absolute: 0.1 10*3/uL (ref 0.0–0.5)
Eosinophils Relative: 2 %
HCT: 38 % — ABNORMAL LOW (ref 39.0–52.0)
Hemoglobin: 12.8 g/dL — ABNORMAL LOW (ref 13.0–17.0)
Immature Granulocytes: 0 %
Lymphocytes Relative: 36 %
Lymphs Abs: 2.5 10*3/uL (ref 0.7–4.0)
MCH: 30.8 pg (ref 26.0–34.0)
MCHC: 33.7 g/dL (ref 30.0–36.0)
MCV: 91.6 fL (ref 80.0–100.0)
Monocytes Absolute: 0.5 10*3/uL (ref 0.1–1.0)
Monocytes Relative: 7 %
Neutro Abs: 3.8 10*3/uL (ref 1.7–7.7)
Neutrophils Relative %: 55 %
Platelets: 233 10*3/uL (ref 150–400)
RBC: 4.15 MIL/uL — ABNORMAL LOW (ref 4.22–5.81)
RDW: 11.8 % (ref 11.5–15.5)
WBC: 6.9 10*3/uL (ref 4.0–10.5)
nRBC: 0 % (ref 0.0–0.2)

## 2022-04-17 LAB — TROPONIN I (HIGH SENSITIVITY)
Troponin I (High Sensitivity): 19 ng/L — ABNORMAL HIGH (ref <18)
Troponin I (High Sensitivity): 20 ng/L — ABNORMAL HIGH (ref <18)

## 2022-04-17 LAB — BRAIN NATRIURETIC PEPTIDE: B Natriuretic Peptide: 77.3 pg/mL (ref 0.0–100.0)

## 2022-04-17 NOTE — Discharge Instructions (Signed)
Return for any problem.  Follow-up with your regular care providers in the outpatient setting tomorrow.

## 2022-04-17 NOTE — ED Triage Notes (Signed)
C/o SOB that started during the night.  States he was unable to sleep.  Also reports mild ankle swelling and abd swelling.  Admitted 6/14 for CHF.  Denies pain at present.

## 2022-04-17 NOTE — ED Notes (Signed)
Pt ambulated while on SpO2 monitor. Pt had no episodes of ShOB, SpO2 maintained 99% during ambulation. Pt states he just feels "groggy."

## 2022-04-17 NOTE — ED Provider Triage Note (Signed)
Emergency Medicine Provider Triage Evaluation Note  DENNYS GUIN , a 51 y.o. male  was evaluated in triage.  Pt complains of shortness of breath. He noticed it this morning.    He has had swelling in his ankles and abdomen since this morning.    Physical Exam  BP (!) 155/95 (BP Location: Right Arm)   Pulse (!) 102   Temp 98.5 F (36.9 C) (Oral)   Resp 14   SpO2 95%  Gen:   Awake, no distress   Resp:  Normal effort  MSK:   Moves extremities without difficulty  Other:  Normal speech  Medical Decision Making  Medically screening exam initiated at 2:28 PM.  Appropriate orders placed.  ROSHON DUELL was informed that the remainder of the evaluation will be completed by another provider, this initial triage assessment does not replace that evaluation, and the importance of remaining in the ED until their evaluation is complete.     Cristina Gong, [place] 04/17/22 1432

## 2022-04-17 NOTE — ED Provider Notes (Signed)
[facility] EMERGENCY DEPARTMENT Provider Note   CSN: [id number] Arrival date & time: 04/17/22  1321     History  Chief Complaint  Patient presents with   Shortness of Breath    Leroy Simmons is a 51 y.o. male.  51 year old male with prior medical history as detailed below presents for evaluation.  Patient reports gradual onset shortness of breath.  Patient reports gradual onset of symptoms since yesterday.  Patient felt that he had slightly increased edema to his lower extremities since yesterday as well.  Patient took an extra dose of Lasix early this morning.  He reports that his symptoms have improved over the course of the day.  He denies associated chest pain.  He denies fever.  Patient with recent history of CHF requiring admission in June.  The history is provided by medical records and the patient.  Shortness of Breath Severity:  Mild Onset quality:  Gradual Duration:  2 days Timing:  Rare Progression:  Waxing and waning Chronicity:  Recurrent      Home Medications Prior to Admission medications   Medication Sig Start Date End Date Taking? Authorizing Provider  amLODipine (NORVASC) 5 MG tablet Take 1 tablet (5 mg total) by mouth daily. 03/10/22   de [place], Raymond J, MD  aspirin 81 MG chewable tablet Chew 1 tablet (81 mg total) by mouth daily. 03/25/22 06/23/22  Dellis Filbert, MD  busPIRone (BUSPAR) 5 MG tablet Take 1.5 tablets (7.5 mg total) by mouth 2 (two) times daily. 03/10/22   de [place], Raymond J, MD  Emtricitabine-Tenofovir AF (DESCOVY PO) Take 1 tablet by mouth daily.    [provider]  ibuprofen (ADVIL) 200 MG tablet Take 200-400 mg by mouth daily as needed.    [provider]  insulin glargine-yfgn (SEMGLEE) 100 UNIT/ML Pen Inject 22 Units into the skin daily. 03/24/22   Dellis Filbert, MD  Insulin Pen Needle (PEN NEEDLES 3/16") 31G X 5 MM MISC 1 each by Does not apply route daily. 01/18/22   de [place], Raymond J, MD   isosorbide mononitrate (IMDUR) 30 MG 24 hr tablet Take 0.5 tablets (15 mg total) by mouth daily. 03/29/22   Joylene Grapes, NP  JARDIANCE 10 MG TABS tablet Take 10 mg by mouth every morning. 03/03/21   [provider]  metFORMIN (GLUCOPHAGE-XR) 500 MG 24 hr tablet Take 2,000 mg by mouth at bedtime. 02/17/21   [provider]  metoprolol succinate (TOPROL-XL) 50 MG 24 hr tablet Take 1 tablet (50 mg total) by mouth daily. Take with or immediately following a meal. 03/25/22   Dellis Filbert, MD  potassium chloride (KLOR-CON M) 10 MEQ tablet Take 1 tablet (10 mEq total) by mouth 2 (two) times daily. 03/29/22   Joylene Grapes, NP  rosuvastatin (CRESTOR) 20 MG tablet Take 1 tablet (20 mg total) by mouth daily. 03/25/22 06/23/22  Dellis Filbert, MD  sertraline (ZOLOFT) 25 MG tablet Take 1 tablet (25 mg total) by mouth daily. 01/18/22   de [place], Buren Kos, MD  torsemide (DEMADEX) 20 MG tablet Take 1 tablet (20 mg total) by mouth 2 (two) times daily. 03/29/22   Joylene Grapes, NP      Allergies    Patient has no known allergies.    Review of Systems   Review of Systems  Respiratory:  Positive for shortness of breath.   All other systems reviewed and are negative.   Physical Exam Updated Vital Signs BP (!) 156/118  Pulse 89   Temp 98.5 F (36.9 C) (Oral)   Resp 11   SpO2 99%  Physical Exam Vitals and nursing note reviewed.  Constitutional:      General: He is not in acute distress.    Appearance: Normal appearance. He is well-developed.  HENT:     Head: Normocephalic and atraumatic.  Eyes:     Conjunctiva/sclera: Conjunctivae normal.     Pupils: Pupils are equal, round, and reactive to light.  Cardiovascular:     Rate and Rhythm: Normal rate and regular rhythm.     Heart sounds: Normal heart sounds.  Pulmonary:     Effort: Pulmonary effort is normal. No respiratory distress.     Breath sounds: Normal breath sounds.  Abdominal:     General: There is no distension.      Palpations: Abdomen is soft.     Tenderness: There is no abdominal tenderness.  Musculoskeletal:        General: No deformity. Normal range of motion.     Cervical back: Normal range of motion and neck supple.  Skin:    General: Skin is warm and dry.  Neurological:     General: No focal deficit present.     Mental Status: He is alert and oriented to person, place, and time.     ED Results / Procedures / Treatments   Labs (all labs ordered are listed, but only abnormal results are displayed) Labs Reviewed  COMPREHENSIVE METABOLIC PANEL - Abnormal; Notable for the following components:      Result Value   Glucose, Bld 252 (*)    BUN 21 (*)    Calcium 8.4 (*)    Albumin 3.4 (*)    All other components within normal limits  CBC WITH DIFFERENTIAL/PLATELET - Abnormal; Notable for the following components:   RBC 4.15 (*)    Hemoglobin 12.8 (*)    HCT 38.0 (*)    All other components within normal limits  TROPONIN I (HIGH SENSITIVITY) - Abnormal; Notable for the following components:   Troponin I (High Sensitivity) 19 (*)    All other components within normal limits  TROPONIN I (HIGH SENSITIVITY) - Abnormal; Notable for the following components:   Troponin I (High Sensitivity) 20 (*)    All other components within normal limits  BRAIN NATRIURETIC PEPTIDE    EKG EKG Interpretation  Date/Time:  Sunday April 17 2022 13:36:06 EDT Ventricular Rate:  99 PR Interval:  156 QRS Duration: 70 QT Interval:  354 QTC Calculation: 454 R Axis:   -1 Text Interpretation: Normal sinus rhythm Minimal voltage criteria for LVH, may be normal variant ( R in aVL ) Nonspecific T wave abnormality Abnormal ECG When compared with ECG of 17-Mar-2022 09:41, PREVIOUS ECG IS PRESENT Confirmed by Kristine Royal [phone]) on 04/17/2022 7:48:14 PM  Radiology DG Chest 2 View  Result Date: 04/17/2022 CLINICAL DATA:  Shortness of breath beginning last night. Lower extremity edema. EXAM: CHEST - 2 VIEW COMPARISON:   03/16/2022 FINDINGS: The heart size and mediastinal contours are within normal limits. Low lung volumes are noted. Both lungs are clear. The visualized skeletal structures are unremarkable. IMPRESSION: Low lung volumes. No active cardiopulmonary disease. Electronically Signed   By: Danae Orleans M.D.   On: 04/17/2022 14:58    Procedures Procedures    Medications Ordered in ED Medications - No data to display  ED Course/ Medical Decision Making/ A&P  Medical Decision Making   Medical Screen Complete  This patient presented to the ED with complaint of shortness of breath.  This complaint involves an extensive number of treatment options. The initial differential diagnosis includes, but is not limited to, CHF exacerbation, pneumonia, other intrapulmonary pathology, metabolic abnormality, etc.  This presentation is: Acute, Chronic, Self-Limited, Previously Undiagnosed, Uncertain Prognosis, Complicated, Systemic Symptoms, and Threat to Life/Bodily Function  Patient with established history of CHF presents with complaint of gradually increase in lower extremity edema and gradual worsening of shortness of breath.  Patient took extra dose of Lasix approximate 12 hours prior to arrival.  Upon evaluation he feels that his symptoms have improved  Screening labs obtained are without significant abnormality.  Patient is without hypoxia.  Ambulation trial reveals normal room air pulse ox.  Patient is appropriate for discharge.  He does understand need for close outpatient follow-up.  Strict return precautions given and understood.     Additional history obtained:  External records from outside sources obtained and reviewed including prior ED visits and prior Inpatient records.    Lab Tests:  I ordered and personally interpreted labs.  The pertinent results include: CBC, troponin, BNP, CMP   Imaging Studies ordered:  I ordered imaging studies including chest  x-ray I independently visualized and interpreted obtained imaging which showed NAD I agree with the radiologist interpretation.   Cardiac Monitoring:  The patient was maintained on a cardiac monitor.  I personally viewed and interpreted the cardiac monitor which showed an underlying rhythm of: NSR  Problem List / ED Course:  Dyspnea   Reevaluation:  After the interventions noted above, I reevaluated the patient and found that they have: improved   Disposition:  After consideration of the diagnostic results and the patients response to treatment, I feel that the patent would benefit from close outpatient follow-up.          Final Clinical Impression(s) / ED Diagnoses Final diagnoses:  Dyspnea, unspecified type    Rx / DC Orders ED Discharge Orders     None         Valarie Merino, MD 04/17/22 2151

## 2022-04-21 ENCOUNTER — Encounter (HOSPITAL_BASED_OUTPATIENT_CLINIC_OR_DEPARTMENT_OTHER): Payer: Self-pay | Admitting: Family Medicine

## 2022-05-24 ENCOUNTER — Encounter (HOSPITAL_BASED_OUTPATIENT_CLINIC_OR_DEPARTMENT_OTHER): Payer: Self-pay | Admitting: Family Medicine

## 2022-05-24 ENCOUNTER — Other Ambulatory Visit (HOSPITAL_BASED_OUTPATIENT_CLINIC_OR_DEPARTMENT_OTHER): Payer: Self-pay

## 2022-05-24 ENCOUNTER — Ambulatory Visit: Payer: Self-pay | Admitting: *Deleted

## 2022-05-24 ENCOUNTER — Other Ambulatory Visit: Payer: Self-pay

## 2022-05-24 ENCOUNTER — Ambulatory Visit (INDEPENDENT_AMBULATORY_CARE_PROVIDER_SITE_OTHER): Payer: Self-pay | Admitting: Family Medicine

## 2022-05-24 DIAGNOSIS — I10 Essential (primary) hypertension: Secondary | ICD-10-CM

## 2022-05-24 DIAGNOSIS — F419 Anxiety disorder, unspecified: Secondary | ICD-10-CM

## 2022-05-24 DIAGNOSIS — E119 Type 2 diabetes mellitus without complications: Secondary | ICD-10-CM

## 2022-05-24 DIAGNOSIS — F32A Depression, unspecified: Secondary | ICD-10-CM

## 2022-05-24 MED ORDER — BLOOD GLUCOSE METER KIT
PACK | 0 refills | Status: AC
  Filled 2022-05-24: qty 1, fill #0

## 2022-05-24 MED ORDER — ROSUVASTATIN CALCIUM 20 MG PO TABS
20.0000 mg | ORAL_TABLET | Freq: Every day | ORAL | 0 refills | Status: DC
  Filled 2022-05-24: qty 30, 30d supply, fill #0

## 2022-05-24 MED ORDER — INSULIN GLARGINE 100 UNITS/ML SOLOSTAR PEN
26.0000 [IU] | PEN_INJECTOR | Freq: Every day | SUBCUTANEOUS | 1 refills | Status: DC
  Filled 2022-05-24: qty 15, 30d supply, fill #0

## 2022-05-24 MED ORDER — INSULIN GLARGINE-YFGN 100 UNIT/ML ~~LOC~~ SOPN
26.0000 [IU] | PEN_INJECTOR | Freq: Every day | SUBCUTANEOUS | 1 refills | Status: DC
  Filled 2022-05-24: qty 9, 30d supply, fill #0

## 2022-05-24 MED ORDER — METOPROLOL SUCCINATE ER 50 MG PO TB24
50.0000 mg | ORAL_TABLET | Freq: Every day | ORAL | 0 refills | Status: DC
  Filled 2022-05-24: qty 30, 30d supply, fill #0

## 2022-05-24 MED ORDER — AMLODIPINE BESYLATE 5 MG PO TABS
5.0000 mg | ORAL_TABLET | Freq: Every day | ORAL | 1 refills | Status: DC
  Filled 2022-05-24: qty 30, 30d supply, fill #0

## 2022-05-24 MED ORDER — BUSPIRONE HCL 5 MG PO TABS
7.5000 mg | ORAL_TABLET | Freq: Two times a day (BID) | ORAL | 0 refills | Status: DC
  Filled 2022-05-24: qty 90, 30d supply, fill #0

## 2022-05-24 MED ORDER — JARDIANCE 10 MG PO TABS
10.0000 mg | ORAL_TABLET | Freq: Every morning | ORAL | 1 refills | Status: DC
  Filled 2022-05-24: qty 90, 90d supply, fill #0

## 2022-05-24 MED ORDER — METFORMIN HCL ER 500 MG PO TB24
2000.0000 mg | ORAL_TABLET | Freq: Every day | ORAL | 1 refills | Status: DC
  Filled 2022-05-24: qty 120, 30d supply, fill #0

## 2022-05-24 NOTE — Addendum Note (Signed)
Addended by: DE [place], Sherrel Shafer J on: 05/24/2022 02:34 PM   Modules accepted: Orders

## 2022-05-24 NOTE — Assessment & Plan Note (Signed)
Not at goal in office today.  Does not check his blood pressure regularly at home.  Not currently having any chest pain or headaches.  He is currently out of medications.  He did have hospitalization a couple months ago due to cardiac issues and has established with cardiology.  He had a visit with cardiology in June and was advised to follow-up about 1 week after that appointment, however he has not done so related to insurance and finances.  Not presently having any chest pain.  He seems to have some confusion regarding being on furosemide or torsemide.  Torsemide is listed on his medication list and in cardiology note, however he reports having furosemide at home.  Recommend that he bring all medications to future office visits to be able to ensure adequate medication reconciliation Refill of medication sent to pharmacy on file today.  Recommend that he schedule follow-up with cardiology, he likely will to do so once new medical insurance is arranged

## 2022-05-24 NOTE — Progress Notes (Addendum)
Procedures performed today:    None.  Independent interpretation of notes and tests performed by another provider:   None.  Brief History, Exam, Impression, and Recommendations:    BP (!) 160/101   Pulse 94   Ht 5\' 7"  (1.702 m)   Wt 220 lb 9.6 oz (100.1 kg)   SpO2 97%   BMI 34.55 kg/m   Unfortunately, patient is overdue for follow-up and has also had hospital admission and emergency department visits since last visit with me without subsequent office visit after these encounters.  He also does not currently have insurance, is in the process of obtaining insurance due to marketplace as his insurer has left the state.  Diabetes mellitus (HCC) He reports to being out of his diabetes medications today.  Current regimen includes insulin glargine, Jardiance, metformin.  It appears that when he was discharged in the hospital couple months ago, he was advised on insulin glargine dose of 22 units daily; more recently has been taking 26 units daily.  Unfortunately, he still does not have a glucometer at home to be checking his blood sugar which certainly increases risk related to insulin therapy and management.  He also mentions that he is not sure if he is out of Jardiance or not, indicating that "he thinks there may be some somewhere at home".  He did have A1c checked a couple months ago and was found to still be elevated at 9.8%.  He is also prescribed metformin, however has not been taking this for a couple weeks or so. Additionally, today he is reporting some blurry vision over the past week or 2.  Feels it may be related to his blood sugars not being appropriately controlled due to being out of medications.  He has not had any complete loss of vision.  He did reach out to patient [facility] today and was advised on presenting to the emergency department for further evaluation.  He instead has scheduled an acute visit with [facility] today. Patient with poor control of chronic medical issues  which is complicated by lack of insurance at present.  He is out of most of his medications. We have sent refills for his medications to the pharmacy downstairs to try to have some assistance and making medications more affordable for patient I have also sent prescription for glucometer so that he may look to obtain this as well.  Discussed risks related to insulin use without proper monitoring of blood sugar including fasting blood sugar as well as intermittent checks during the day based on symptoms Given reported control, also feel that he will need to establish with endocrinologist, however he does not currently have medical insurance, will look to place referral once this is arranged  Essential hypertension Not at goal in office today.  Does not check his blood pressure regularly at home.  Not currently having any chest pain or headaches.  He is currently out of medications.  He did have hospitalization a couple months ago due to cardiac issues and has established with cardiology.  He had a visit with cardiology in June and was advised to follow-up about 1 week after that appointment, however he has not done so related to insurance and finances.  Not presently having any chest pain.  He seems to have some confusion regarding being on furosemide or torsemide.  Torsemide is listed on his medication list and in cardiology note, however he reports having furosemide at home.  Recommend that he bring all medications  to future office visits to be able to ensure adequate medication reconciliation Refill of medication sent to pharmacy on file today.  Recommend that he schedule follow-up with cardiology, he likely will to do so once new medical insurance is arranged  Anxiety and depression Has previously been managed with sertraline and buspirone.  Only has buspirone at home currently.  He feels that symptoms have been controlled at present and in discussing further refills, he declines refill of sertraline at  this time Discussed that optimally, treatment will entail use of SSRI with agent for augmentation such as buspirone.  Despite this, he would like to only continue buspirone at this time  Return in about 4 weeks (around 06/21/2022) for DM, HTN.   ___________________________________________ Sherleen Pangborn de [place], MD, ABFM, CAQSM Primary Care and Sports Medicine [facility] with pharmacist. Able to get cheaper Lantus as compared to [facility], Rx sent for this as a result.

## 2022-05-24 NOTE — Telephone Encounter (Signed)
Summary: blurred vision / rx concern   The patient has called on [facility] main number   The patient has been without their diabetic medications for a few weeks   The patient ran out of insulin today 05/24/22   The patient has noticed their vision becoming blurred and obstructed with "floaters"   The patient's insurance has momentarily lapsed and they would like to know where they can get their medication   Please contact further when possible        Chief Complaint: blurred vision worsening , took last dose of insulin today  Symptoms: blurred vision, "floaters" and black dots". Sees like "cob webs". Reports does not know where other diabetic medications are and has not taken due to financial issues. Reports dizziness before not now. Reports he has no meter to check blood glucose  Frequency: 1-2 weeks  Pertinent Negatives: Patient denies headache, no weakness on either side of body no speech issues.  Disposition: [x] ED /[] Urgent Care (no appt availability in office) / [] Appointment(In office/virtual)/ []  [place] [facility]/ [] Home Care/ [] Refused Recommended Disposition /[] [facility] [facility]/ []  Follow-up with PCP Additional Notes:   Recommended patient go to ED to be evaluated, check blood glucose . Contact PCP again and explain he has taken last 8 units of his insulin and has not been taken other oral diabetic medications. Patient reports he has to go to work. Recommended to go to ED.    Reason for Disposition  Many floaters in the eye  (Exception: Floater(s) are a chronic symptom and this is unchanged from patient's baseline pattern.)  Answer Assessment - Initial Assessment Questions 1. DESCRIPTION: "How has your vision changed?" (e.g., complete vision loss, blurred vision, double vision, floaters, etc.)     Blurred vision, floaters, "black dots" 2. LOCATION: "One or both eyes?" If one, ask: "Which eye?"     Both  3. SEVERITY: "Can you see anything?" If Yes, ask: "What can you  see?" (e.g., fine print)     Make out objects , big objects . Struggle seeing  small print  4. ONSET: "When did this begin?" "Did it start suddenly or has this been gradual?"     1-2 weeks 5. PATTERN: "Does this come and go, or has it been constant since it started?"     Constant now was coming and going  6. PAIN: "Is there any pain in your eye(s)?"  (Scale 1-10; or mild, moderate, severe)   - NONE (0): No pain.   - MILD (1-3): Doesn't interfere with normal activities.   - MODERATE (4-7): Interferes with normal activities or awakens from sleep.    - SEVERE (8-10): Excruciating pain, unable to do any normal activities.     Feel very dry , irritated 7. CONTACTS-GLASSES: "Do you wear contacts or glasses?"     Reading glasses 8. CAUSE: "What do you think is causing this visual problem?"     Elevated blood sugar 9. OTHER SYMPTOMS: "Do you have any other symptoms?" (e.g., confusion, headache, arm or leg weakness, speech problems)     Blurred vision, some dizziness before not now ran out of medication for diabetes 10. PREGNANCY: "Is there any chance you are pregnant?" "When was your last menstrual period?"       na  Protocols used: Vision Loss or Change-A-AH

## 2022-05-24 NOTE — Assessment & Plan Note (Signed)
Has previously been managed with sertraline and buspirone.  Only has buspirone at home currently.  He feels that symptoms have been controlled at present and in discussing further refills, he declines refill of sertraline at this time Discussed that optimally, treatment will entail use of SSRI with agent for augmentation such as buspirone.  Despite this, he would like to only continue buspirone at this time

## 2022-05-24 NOTE — Assessment & Plan Note (Signed)
He reports to being out of his diabetes medications today.  Current regimen includes insulin glargine, Jardiance, metformin.  It appears that when he was discharged in the hospital couple months ago, he was advised on insulin glargine dose of 22 units daily; more recently has been taking 26 units daily.  Unfortunately, he still does not have a glucometer at home to be checking his blood sugar which certainly increases risk related to insulin therapy and management.  He also mentions that he is not sure if he is out of Jardiance or not, indicating that "he thinks there may be some somewhere at home".  He did have A1c checked a couple months ago and was found to still be elevated at 9.8%.  He is also prescribed metformin, however has not been taking this for a couple weeks or so. Additionally, today he is reporting some blurry vision over the past week or 2.  Feels it may be related to his blood sugars not being appropriately controlled due to being out of medications.  He has not had any complete loss of vision.  He did reach out to patient [facility] today and was advised on presenting to the emergency department for further evaluation.  He instead has scheduled an acute visit with [place] today. Patient with poor control of chronic medical issues which is complicated by lack of insurance at present.  He is out of most of his medications. We have sent refills for his medications to the pharmacy downstairs to try to have some assistance and making medications more affordable for patient I have also sent prescription for glucometer so that he may look to obtain this as well.  Discussed risks related to insulin use without proper monitoring of blood sugar including fasting blood sugar as well as intermittent checks during the day based on symptoms Given reported control, also feel that he will need to establish with endocrinologist, however he does not currently have medical insurance, will look to place referral  once this is arranged

## 2022-05-24 NOTE — Patient Instructions (Signed)
  Medication Instructions:  Your physician recommends that you continue on your current medications as directed. Please refer to the Current Medication list given to you today. --If you need a refill on any your medications before your next appointment, please call your pharmacy first. If no refills are authorized on file call the office.-- Lab Work: Your physician has recommended that you have lab work today: No If you have labs (blood work) drawn today and your tests are completely normal, you will receive your results via MyChart message OR a phone call from our staff.  Please ensure you check your voicemail in the event that you authorized detailed messages to be left on a delegated number. If you have any lab test that is abnormal or we need to change your treatment, we will call you to review the results.  Referrals/Procedures/Imaging: No  Follow-Up: Your next appointment:   Your physician recommends that you schedule a follow-up appointment in: 3-4 weeks with Dr. de [place].  You will receive a text message or e-mail with a link to a survey about your care and experience with us today! We would greatly appreciate your feedback!   Thanks for letting us be apart of your health journey!!  Primary Care and Sports Medicine   Dr. Raymond de [place]   We encourage you to activate your patient portal called "MyChart".  Sign up information is provided on this After Visit Summary.  MyChart is used to connect with patients for Virtual Visits (Telemedicine).  Patients are able to view lab/test results, encounter notes, upcoming appointments, etc.  Non-urgent messages can be sent to your provider as well. To learn more about what you can do with MyChart, please visit --  [email or web address].    

## 2022-05-25 ENCOUNTER — Other Ambulatory Visit: Payer: Self-pay

## 2022-05-25 ENCOUNTER — Emergency Department (HOSPITAL_BASED_OUTPATIENT_CLINIC_OR_DEPARTMENT_OTHER)
Admission: EM | Admit: 2022-05-25 | Discharge: 2022-05-26 | Disposition: A | Discharge status: Home / Self Care | Payer: Self-pay | Attending: Emergency Medicine | Admitting: Emergency Medicine

## 2022-05-25 ENCOUNTER — Encounter (HOSPITAL_BASED_OUTPATIENT_CLINIC_OR_DEPARTMENT_OTHER): Payer: Self-pay

## 2022-05-25 DIAGNOSIS — H43393 Other vitreous opacities, bilateral: Secondary | ICD-10-CM | POA: Insufficient documentation

## 2022-05-25 DIAGNOSIS — J45909 Unspecified asthma, uncomplicated: Secondary | ICD-10-CM | POA: Insufficient documentation

## 2022-05-25 DIAGNOSIS — Z7984 Long term (current) use of oral hypoglycemic drugs: Secondary | ICD-10-CM | POA: Insufficient documentation

## 2022-05-25 DIAGNOSIS — Z794 Long term (current) use of insulin: Secondary | ICD-10-CM | POA: Insufficient documentation

## 2022-05-25 DIAGNOSIS — Z79899 Other long term (current) drug therapy: Secondary | ICD-10-CM | POA: Insufficient documentation

## 2022-05-25 DIAGNOSIS — E119 Type 2 diabetes mellitus without complications: Secondary | ICD-10-CM | POA: Insufficient documentation

## 2022-05-25 DIAGNOSIS — Z7982 Long term (current) use of aspirin: Secondary | ICD-10-CM | POA: Insufficient documentation

## 2022-05-25 DIAGNOSIS — I110 Hypertensive heart disease with heart failure: Secondary | ICD-10-CM | POA: Insufficient documentation

## 2022-05-25 DIAGNOSIS — I509 Heart failure, unspecified: Secondary | ICD-10-CM | POA: Insufficient documentation

## 2022-05-25 LAB — CBG MONITORING, ED: Glucose-Capillary: 140 mg/dL — ABNORMAL HIGH (ref 70–99)

## 2022-05-25 NOTE — ED Provider Notes (Signed)
[facility] EMERGENCY DEPT Provider Note  CSN: [id number] Arrival date & time: 05/25/22 2005  Chief Complaint(s) Eye Problem  HPI Leroy Simmons is a 51 y.o. male {Add pertinent medical, surgical, social history, OB history to HPI:1}    Eye Problem   Past Medical History Past Medical History:  Diagnosis Date   Anxiety    CHF (congestive heart failure) ([place])    Depression    Diabetes mellitus without complication ([place])    Hypertension    Patient Active Problem List   Diagnosis Date Noted   AKI (acute kidney injury) ([place]) 03/18/2022   Coronary atherosclerosis of native coronary artery 03/18/2022   Acute exacerbation of CHF (congestive heart failure) ([place]) 03/18/2022   Cellulitis of right lower extremity    CHF exacerbation ([place]) 03/17/2022   Anxiety and depression 03/10/2022   Cardiomegaly 01/18/2022   Diabetes mellitus ([place]) 12/07/2021   Hyperlipidemia 12/07/2021   Pain of both shoulder joints 10/22/2021   Cellulitis of back except buttock 10/01/2020   Asthma 10/26/2019   Cellulitis of left heel 10/26/2019   Chest pain 10/26/2019   Left foot pain 10/26/2019   Cellulitis and abscess of neck 07/13/2019   Abscess of back, except buttock 05/30/2019   HYPOKALEMIA 09/07/2009   MORBID OBESITY 09/07/2009   Anxiety 09/07/2009   Depression 09/07/2009   Essential hypertension 09/07/2009   ASTHMA 09/07/2009   DIARRHEA, CHRONIC 09/07/2009   Home Medication(s) Prior to Admission medications   Medication Sig Start Date End Date Taking? Authorizing Provider  amLODipine (NORVASC) 5 MG tablet Take 1 tablet (5 mg total) by mouth daily. 05/24/22   de [place], Raymond J, MD  aspirin 81 MG chewable tablet Chew 1 tablet (81 mg total) by mouth daily. 03/25/22 06/23/22  Timothy Lasso, MD  blood glucose meter kit and supplies Dispense based on patient and insurance preference. Use up to four times daily as directed. (FOR ICD-10 E10.9, E11.9). 05/24/22   de [place], Blondell Reveal, MD   busPIRone (BUSPAR) 5 MG tablet Take 1.5 tablets (7.5 mg total) by mouth 2 (two) times daily. 05/24/22   [place], Blondell Reveal, MD  Emtricitabine-Tenofovir AF (DESCOVY PO) Take 1 tablet by mouth daily.    [provider]  ibuprofen (ADVIL) 200 MG tablet Take 200-400 mg by mouth daily as needed.    [provider]  insulin glargine (LANTUS) 100 unit/mL SOPN Inject 26 Units into the skin daily. 05/24/22   de [place], Raymond J, MD  Insulin Pen Needle (PEN NEEDLES 3/16") 31G X 5 MM MISC 1 each by Does not apply route daily. 01/18/22   de [place], Raymond J, MD  isosorbide mononitrate (IMDUR) 30 MG 24 hr tablet Take 0.5 tablets (15 mg total) by mouth daily. 03/29/22   Lenna Sciara, NP  JARDIANCE 10 MG TABS tablet Take 1 tablet (10 mg total) by mouth every morning. 05/24/22   de [place], Blondell Reveal, MD  metFORMIN (GLUCOPHAGE-XR) 500 MG 24 hr tablet Take 4 tablets (2,000 mg total) by mouth at bedtime. 05/24/22   de [place], Blondell Reveal, MD  metoprolol succinate (TOPROL-XL) 50 MG 24 hr tablet Take 1 tablet (50 mg total) by mouth daily. Take with or immediately following a meal. 05/24/22   de [place], Blondell Reveal, MD  potassium chloride (KLOR-CON M) 10 MEQ tablet Take 1 tablet (10 mEq total) by mouth 2 (two) times daily. 03/29/22   Lenna Sciara, NP  rosuvastatin (CRESTOR) 20 MG tablet Take 1 tablet (20 mg total)  by mouth daily. 05/24/22 08/22/22  de [place], Raymond J, MD  sertraline (ZOLOFT) 25 MG tablet Take 1 tablet (25 mg total) by mouth daily. 01/18/22   de [place], Blondell Reveal, MD  torsemide (DEMADEX) 20 MG tablet Take 1 tablet (20 mg total) by mouth 2 (two) times daily. 03/29/22   Lenna Sciara, NP                                                                                                                                    Allergies Patient has no known allergies.  Review of Systems Review of Systems As noted in HPI  Physical Exam Vital Signs  I have reviewed the triage vital signs BP 135/81   Pulse 90    Temp 98.2 F (36.8 C)   Resp 16   Ht 5' 7"  (1.702 m)   Wt 100.1 kg   SpO2 99%   BMI 34.56 kg/m  *** Physical Exam  ED Results and Treatments Labs (all labs ordered are listed, but only abnormal results are displayed) Labs Reviewed  CBG MONITORING, ED - Abnormal; Notable for the following components:      Result Value   Glucose-Capillary 140 (*)    All other components within normal limits                                                                                                                         EKG  EKG Interpretation  Date/Time:    Ventricular Rate:    PR Interval:    QRS Duration:   QT Interval:    QTC Calculation:   R Axis:     Text Interpretation:         Radiology No results found.  Medications Ordered in ED Medications - No data to display  Procedures Procedures  (including critical care time)  Medical Decision Making / ED Course   Medical Decision Making         Final Clinical Impression(s) / ED Diagnoses Final diagnoses:  None    {Document critical care time when appropriate:1}  {Document review of labs and clinical decision tools ie heart score, Chads2Vasc2 etc:1}  {Document your independent review of radiology images, and any outside records:1} {Document your discussion with family members, caretakers, and with consultants:1} {Document social determinants of health affecting pt's care:1} {Document your decision making why or why not admission, treatments were needed:1} This chart was dictated using voice recognition software.  Despite best efforts to proofread,  errors can occur which can change the documentation meaning.

## 2022-05-25 NOTE — Telephone Encounter (Signed)
Pt called today. Pt was seen at PCP office yesterday, however eye concerns were not addressed.  Pt will go to ED for care this afternoon.

## 2022-05-25 NOTE — ED Triage Notes (Signed)
Patient here POV from Home.  Endorses Blurry Vision for Several Weeks. States it was Initally Intermittent but recently Symptoms have become constant ("Cobwebs and Floaters".   States this has Occurred in the Past and usually subsides.   Visited PCP Yesterday but states Ailment was not Discussed. Uses Reading Glasses. No Pain.   NAD Noted during Triage. A&Ox4. GCS 15. Ambulatory.

## 2022-06-01 ENCOUNTER — Observation Stay (HOSPITAL_COMMUNITY)
Admission: EM | Admit: 2022-06-01 | Discharge: 2022-06-03 | Disposition: A | Discharge status: Home / Self Care | Payer: Self-pay | Attending: Family Medicine | Admitting: Family Medicine

## 2022-06-01 ENCOUNTER — Emergency Department (HOSPITAL_COMMUNITY): Payer: Self-pay

## 2022-06-01 ENCOUNTER — Encounter (HOSPITAL_COMMUNITY): Payer: Self-pay | Admitting: Emergency Medicine

## 2022-06-01 ENCOUNTER — Other Ambulatory Visit: Payer: Self-pay

## 2022-06-01 DIAGNOSIS — Z79899 Other long term (current) drug therapy: Secondary | ICD-10-CM | POA: Insufficient documentation

## 2022-06-01 DIAGNOSIS — N179 Acute kidney failure, unspecified: Principal | ICD-10-CM | POA: Insufficient documentation

## 2022-06-01 DIAGNOSIS — Z794 Long term (current) use of insulin: Secondary | ICD-10-CM | POA: Insufficient documentation

## 2022-06-01 DIAGNOSIS — F32A Depression, unspecified: Secondary | ICD-10-CM | POA: Diagnosis present

## 2022-06-01 DIAGNOSIS — E1169 Type 2 diabetes mellitus with other specified complication: Secondary | ICD-10-CM | POA: Diagnosis present

## 2022-06-01 DIAGNOSIS — E1159 Type 2 diabetes mellitus with other circulatory complications: Secondary | ICD-10-CM | POA: Diagnosis present

## 2022-06-01 DIAGNOSIS — R911 Solitary pulmonary nodule: Secondary | ICD-10-CM | POA: Insufficient documentation

## 2022-06-01 DIAGNOSIS — B20 Human immunodeficiency virus [HIV] disease: Secondary | ICD-10-CM | POA: Insufficient documentation

## 2022-06-01 DIAGNOSIS — Z7982 Long term (current) use of aspirin: Secondary | ICD-10-CM | POA: Insufficient documentation

## 2022-06-01 DIAGNOSIS — I110 Hypertensive heart disease with heart failure: Secondary | ICD-10-CM | POA: Insufficient documentation

## 2022-06-01 DIAGNOSIS — I2510 Atherosclerotic heart disease of native coronary artery without angina pectoris: Secondary | ICD-10-CM | POA: Insufficient documentation

## 2022-06-01 DIAGNOSIS — Z7984 Long term (current) use of oral hypoglycemic drugs: Secondary | ICD-10-CM | POA: Insufficient documentation

## 2022-06-01 DIAGNOSIS — I5033 Acute on chronic diastolic (congestive) heart failure: Secondary | ICD-10-CM | POA: Diagnosis present

## 2022-06-01 DIAGNOSIS — F419 Anxiety disorder, unspecified: Secondary | ICD-10-CM | POA: Diagnosis present

## 2022-06-01 DIAGNOSIS — I10 Essential (primary) hypertension: Secondary | ICD-10-CM | POA: Diagnosis present

## 2022-06-01 DIAGNOSIS — I5032 Chronic diastolic (congestive) heart failure: Secondary | ICD-10-CM | POA: Insufficient documentation

## 2022-06-01 DIAGNOSIS — N189 Chronic kidney disease, unspecified: Secondary | ICD-10-CM | POA: Diagnosis present

## 2022-06-01 DIAGNOSIS — E785 Hyperlipidemia, unspecified: Secondary | ICD-10-CM | POA: Diagnosis present

## 2022-06-01 DIAGNOSIS — E119 Type 2 diabetes mellitus without complications: Secondary | ICD-10-CM | POA: Insufficient documentation

## 2022-06-01 DIAGNOSIS — R1084 Generalized abdominal pain: Secondary | ICD-10-CM

## 2022-06-01 LAB — CBC WITH DIFFERENTIAL/PLATELET
Abs Immature Granulocytes: 0.01 10*3/uL (ref 0.00–0.07)
Basophils Absolute: 0 10*3/uL (ref 0.0–0.1)
Basophils Relative: 1 %
Eosinophils Absolute: 0.1 10*3/uL (ref 0.0–0.5)
Eosinophils Relative: 1 %
HCT: 41.6 % (ref 39.0–52.0)
Hemoglobin: 13.8 g/dL (ref 13.0–17.0)
Immature Granulocytes: 0 %
Lymphocytes Relative: 33 %
Lymphs Abs: 2.7 10*3/uL (ref 0.7–4.0)
MCH: 30.6 pg (ref 26.0–34.0)
MCHC: 33.2 g/dL (ref 30.0–36.0)
MCV: 92.2 fL (ref 80.0–100.0)
Monocytes Absolute: 0.6 10*3/uL (ref 0.1–1.0)
Monocytes Relative: 7 %
Neutro Abs: 4.9 10*3/uL (ref 1.7–7.7)
Neutrophils Relative %: 58 %
Platelets: 283 10*3/uL (ref 150–400)
RBC: 4.51 MIL/uL (ref 4.22–5.81)
RDW: 11.7 % (ref 11.5–15.5)
WBC: 8.4 10*3/uL (ref 4.0–10.5)
nRBC: 0 % (ref 0.0–0.2)

## 2022-06-01 LAB — COMPREHENSIVE METABOLIC PANEL
ALT: 27 U/L (ref 0–44)
AST: 28 U/L (ref 15–41)
Albumin: 3.9 g/dL (ref 3.5–5.0)
Alkaline Phosphatase: 90 U/L (ref 38–126)
Anion gap: 9 (ref 5–15)
BUN: 39 mg/dL — ABNORMAL HIGH (ref 6–20)
CO2: 26 mmol/L (ref 22–32)
Calcium: 9 mg/dL (ref 8.9–10.3)
Chloride: 101 mmol/L (ref 98–111)
Creatinine, Ser: 2.61 mg/dL — ABNORMAL HIGH (ref 0.61–1.24)
GFR, Estimated: 29 mL/min — ABNORMAL LOW (ref >60)
Glucose, Bld: 227 mg/dL — ABNORMAL HIGH (ref 70–99)
Potassium: 4.3 mmol/L (ref 3.5–5.1)
Sodium: 136 mmol/L (ref 135–145)
Total Bilirubin: 0.8 mg/dL (ref 0.3–1.2)
Total Protein: 6.9 g/dL (ref 6.5–8.1)

## 2022-06-01 LAB — LIPASE, BLOOD: Lipase: 42 U/L (ref 11–51)

## 2022-06-01 NOTE — ED Triage Notes (Signed)
Pt c/o upper abdominal pain with nausea, shortness of breath and dizziness that started Saturday.

## 2022-06-01 NOTE — ED Provider Triage Note (Signed)
Emergency Medicine Provider Triage Evaluation Note  Leroy Simmons , a 51 y.o. male  was evaluated in triage.  Pt complains of bilateral rib pain, back pain at the same level, and mild intermittent nausea since Saturday.  With some mild dizziness as well yesterday however none today.  No recent trauma or hx of GERD.  Worsened by any exertion or movements.  Relieved by rest.  Denies vomiting, diarrhea, vision changes, or chest pain.  With hx of HTN, anxiety, depression, asthma, and CHF.    Review of Systems  Positive:  Negative: See above  Physical Exam  BP 113/71 (BP Location: Right Arm)   Pulse 85   Temp 98.5 F (36.9 C) (Oral)   Resp 16   SpO2 96%  Gen:   Awake, no distress   Resp:  Normal effort, equal rise MSK:   Moves extremities without difficulty  Other:  Abdomen soft, non-tender  Medical Decision Making  Medically screening exam initiated at 9:17 PM.  Appropriate orders placed.  Leroy Simmons was informed that the remainder of the evaluation will be completed by another provider, this initial triage assessment does not replace that evaluation, and the importance of remaining in the ED until their evaluation is complete.     Cecil Cobbs, [place] 06/01/22 2119

## 2022-06-02 ENCOUNTER — Emergency Department (HOSPITAL_COMMUNITY): Payer: Self-pay

## 2022-06-02 ENCOUNTER — Observation Stay (HOSPITAL_COMMUNITY): Payer: Self-pay

## 2022-06-02 ENCOUNTER — Encounter (HOSPITAL_COMMUNITY): Payer: Self-pay | Admitting: Internal Medicine

## 2022-06-02 DIAGNOSIS — N179 Acute kidney failure, unspecified: Principal | ICD-10-CM

## 2022-06-02 DIAGNOSIS — R911 Solitary pulmonary nodule: Secondary | ICD-10-CM | POA: Diagnosis present

## 2022-06-02 DIAGNOSIS — I5033 Acute on chronic diastolic (congestive) heart failure: Secondary | ICD-10-CM | POA: Diagnosis present

## 2022-06-02 DIAGNOSIS — I5032 Chronic diastolic (congestive) heart failure: Secondary | ICD-10-CM | POA: Diagnosis present

## 2022-06-02 LAB — URINALYSIS, ROUTINE W REFLEX MICROSCOPIC
Bacteria, UA: NONE SEEN
Bilirubin Urine: NEGATIVE
Glucose, UA: >=500 mg/dL — AB
Hgb urine dipstick: NEGATIVE
Ketones, ur: NEGATIVE mg/dL
Leukocytes,Ua: NEGATIVE
Nitrite: NEGATIVE
Protein, ur: 100 mg/dL — AB
Specific Gravity, Urine: 1.021 (ref 1.005–1.030)
pH: 5 (ref 5.0–8.0)

## 2022-06-02 LAB — CREATININE, URINE, RANDOM: Creatinine, Urine: 58 mg/dL

## 2022-06-02 LAB — SODIUM, URINE, RANDOM: Sodium, Ur: 62 mmol/L

## 2022-06-02 LAB — GLUCOSE, CAPILLARY: Glucose-Capillary: 137 mg/dL — ABNORMAL HIGH (ref 70–99)

## 2022-06-02 LAB — CK: Total CK: 84 U/L (ref 49–397)

## 2022-06-02 LAB — TROPONIN I (HIGH SENSITIVITY): Troponin I (High Sensitivity): 17 ng/L (ref <18)

## 2022-06-02 LAB — CBG MONITORING, ED
Glucose-Capillary: 195 mg/dL — ABNORMAL HIGH (ref 70–99)
Glucose-Capillary: 157 mg/dL — ABNORMAL HIGH (ref 70–99)

## 2022-06-02 MED ORDER — POLYETHYLENE GLYCOL 3350 17 G PO PACK: 17.0000 g | PACK | Freq: Every day | ORAL | Status: DC | PRN

## 2022-06-02 MED ORDER — ENOXAPARIN SODIUM 40 MG/0.4ML IJ SOSY
40.0000 mg | PREFILLED_SYRINGE | INTRAMUSCULAR | Status: DC
  Administered 2022-06-03: 40 mg via SUBCUTANEOUS
  Filled 2022-06-02: qty 0.4

## 2022-06-02 MED ORDER — BISACODYL 5 MG PO TBEC: 5.0000 mg | DELAYED_RELEASE_TABLET | Freq: Every day | ORAL | Status: DC | PRN

## 2022-06-02 MED ORDER — LACTATED RINGERS IV SOLN: INTRAVENOUS | Status: DC

## 2022-06-02 MED ORDER — EMTRICITABINE-TENOFOVIR AF 200-25 MG PO TABS
1.0000 | ORAL_TABLET | Freq: Every day | ORAL | Status: DC
  Filled 2022-06-02: qty 1

## 2022-06-02 MED ORDER — ONDANSETRON HCL 4 MG/2ML IJ SOLN: 4.0000 mg | Freq: Four times a day (QID) | INTRAMUSCULAR | Status: DC | PRN

## 2022-06-02 MED ORDER — SERTRALINE HCL 50 MG PO TABS
25.0000 mg | ORAL_TABLET | Freq: Every day | ORAL | Status: DC
  Administered 2022-06-03: 25 mg via ORAL
  Filled 2022-06-02: qty 1

## 2022-06-02 MED ORDER — DOCUSATE SODIUM 100 MG PO CAPS
100.0000 mg | ORAL_CAPSULE | Freq: Two times a day (BID) | ORAL | Status: DC
Start: 2022-06-02 — End: 2022-06-03
  Administered 2022-06-03: 100 mg via ORAL
  Filled 2022-06-02: qty 1

## 2022-06-02 MED ORDER — ONDANSETRON HCL 4 MG PO TABS: 4.0000 mg | ORAL_TABLET | Freq: Four times a day (QID) | ORAL | Status: DC | PRN

## 2022-06-02 MED ORDER — ASPIRIN 81 MG PO CHEW
81.0000 mg | CHEWABLE_TABLET | Freq: Every day | ORAL | Status: DC
  Administered 2022-06-03: 81 mg via ORAL
  Filled 2022-06-02: qty 1

## 2022-06-02 MED ORDER — ISOSORBIDE MONONITRATE ER 30 MG PO TB24: 15.0000 mg | ORAL_TABLET | Freq: Every day | ORAL | Status: DC

## 2022-06-02 MED ORDER — AMLODIPINE BESYLATE 5 MG PO TABS
5.0000 mg | ORAL_TABLET | Freq: Every day | ORAL | Status: DC
  Administered 2022-06-03: 5 mg via ORAL
  Filled 2022-06-02: qty 1

## 2022-06-02 MED ORDER — INSULIN GLARGINE 100 UNITS/ML SOLOSTAR PEN
26.0000 [IU] | PEN_INJECTOR | Freq: Every day | SUBCUTANEOUS | Status: DC
Start: 2022-06-02 — End: 2022-06-02
  Filled 2022-06-02: qty 3

## 2022-06-02 MED ORDER — SODIUM CHLORIDE 0.9 % IV SOLN: INTRAVENOUS | Status: DC

## 2022-06-02 MED ORDER — INSULIN GLARGINE-YFGN 100 UNIT/ML ~~LOC~~ SOLN
26.0000 [IU] | Freq: Every day | SUBCUTANEOUS | Status: DC
  Administered 2022-06-02 – 2022-06-03 (×2): 26 [IU] via SUBCUTANEOUS
  Filled 2022-06-02 (×2): qty 0.26

## 2022-06-02 MED ORDER — ACETAMINOPHEN 325 MG PO TABS: 650.0000 mg | ORAL_TABLET | Freq: Four times a day (QID) | ORAL | Status: DC | PRN

## 2022-06-02 MED ORDER — INSULIN ASPART 100 UNIT/ML IJ SOLN: 0.0000 [IU] | Freq: Every day | INTRAMUSCULAR | Status: DC

## 2022-06-02 MED ORDER — METOPROLOL SUCCINATE ER 50 MG PO TB24
50.0000 mg | ORAL_TABLET | Freq: Every day | ORAL | Status: DC
  Administered 2022-06-03: 50 mg via ORAL
  Filled 2022-06-02: qty 1

## 2022-06-02 MED ORDER — ONDANSETRON HCL 4 MG/2ML IJ SOLN
4.0000 mg | Freq: Once | INTRAMUSCULAR | Status: AC
  Administered 2022-06-02: 4 mg via INTRAVENOUS
  Filled 2022-06-02: qty 2

## 2022-06-02 MED ORDER — ACETAMINOPHEN 650 MG RE SUPP: 650.0000 mg | Freq: Four times a day (QID) | RECTAL | Status: DC | PRN

## 2022-06-02 MED ORDER — INSULIN ASPART 100 UNIT/ML IJ SOLN
0.0000 [IU] | Freq: Three times a day (TID) | INTRAMUSCULAR | Status: DC
  Administered 2022-06-02: 3 [IU] via SUBCUTANEOUS
  Administered 2022-06-03: 2 [IU] via SUBCUTANEOUS

## 2022-06-02 MED ORDER — ROSUVASTATIN CALCIUM 20 MG PO TABS
20.0000 mg | ORAL_TABLET | Freq: Every day | ORAL | Status: DC
  Administered 2022-06-03: 20 mg via ORAL
  Filled 2022-06-02: qty 1

## 2022-06-02 MED ORDER — BUSPIRONE HCL 5 MG PO TABS
7.5000 mg | ORAL_TABLET | Freq: Two times a day (BID) | ORAL | Status: DC
  Administered 2022-06-03: 7.5 mg via ORAL
  Filled 2022-06-02: qty 1
  Filled 2022-06-02: qty 2
  Filled 2022-06-02: qty 1

## 2022-06-02 MED ORDER — HYDRALAZINE HCL 20 MG/ML IJ SOLN: 5.0000 mg | INTRAMUSCULAR | Status: DC | PRN

## 2022-06-02 MED ORDER — FENTANYL CITRATE PF 50 MCG/ML IJ SOSY
50.0000 ug | PREFILLED_SYRINGE | Freq: Once | INTRAMUSCULAR | Status: AC
  Administered 2022-06-02: 50 ug via INTRAVENOUS
  Filled 2022-06-02: qty 1

## 2022-06-02 MED ORDER — SODIUM CHLORIDE 0.9 % IV BOLUS (SEPSIS)
1000.0000 mL | Freq: Once | INTRAVENOUS | Status: AC
  Administered 2022-06-02: 1000 mL via INTRAVENOUS

## 2022-06-02 NOTE — ED Provider Notes (Signed)
[facility] Provider Note   CSN: [id number] Arrival date & time: 06/01/22  2050     History  Chief Complaint  Patient presents with  . Abdominal Pain    Leroy Simmons is a 51 y.o. male.  The history is provided by the patient.  Patient presents for multiple complaints. He reports for at least 5 days he has had generalized back pain and bilateral chest pain.  He reports nausea without vomiting.  He has had some cough and shortness of breath.  He also reports chronic diarrhea.  He reports lightheadedness, but no syncope.  No fevers are reported.  No dysuria.  Patient reports he takes diuretics and urinates frequently, but reports he is keeping up with his oral fluids   Patient reports he had visual floaters for several weeks, they are unchanged, he has ophthalmology follow-up next month Home Medications Prior to Admission medications   Medication Sig Start Date End Date Taking? Authorizing Provider  amLODipine (NORVASC) 5 MG tablet Take 1 tablet (5 mg total) by mouth daily. 05/24/22   [place], Raymond J, MD  aspirin 81 MG chewable tablet Chew 1 tablet (81 mg total) by mouth daily. 03/25/22 06/23/22  Timothy Lasso, MD  blood glucose meter kit and supplies Dispense based on patient and insurance preference. Use up to four times daily as directed. (FOR ICD-10 E10.9, E11.9). 05/24/22   [place], Blondell Reveal, MD  busPIRone (BUSPAR) 5 MG tablet Take 1.5 tablets (7.5 mg total) by mouth 2 (two) times daily. 05/24/22   [place], Blondell Reveal, MD  Emtricitabine-Tenofovir AF (DESCOVY PO) Take 1 tablet by mouth daily.    [provider]  ibuprofen (ADVIL) 200 MG tablet Take 200-400 mg by mouth daily as needed.    [provider]  insulin glargine (LANTUS) 100 unit/mL SOPN Inject 26 Units into the skin daily. 05/24/22   [place], Raymond J, MD  Insulin Pen Needle (PEN NEEDLES 3/16") 31G X 5 MM MISC 1 each by Does not apply route daily. 01/18/22   de  [place], Raymond J, MD  isosorbide mononitrate (IMDUR) 30 MG 24 hr tablet Take 0.5 tablets (15 mg total) by mouth daily. 03/29/22   Lenna Sciara, NP  JARDIANCE 10 MG TABS tablet Take 1 tablet (10 mg total) by mouth every morning. 05/24/22   [place], Blondell Reveal, MD  metFORMIN (GLUCOPHAGE-XR) 500 MG 24 hr tablet Take 4 tablets (2,000 mg total) by mouth at bedtime. 05/24/22   [place], Blondell Reveal, MD  metoprolol succinate (TOPROL-XL) 50 MG 24 hr tablet Take 1 tablet (50 mg total) by mouth daily. Take with or immediately following a meal. 05/24/22   [place], Blondell Reveal, MD  potassium chloride (KLOR-CON M) 10 MEQ tablet Take 1 tablet (10 mEq total) by mouth 2 (two) times daily. 03/29/22   Lenna Sciara, NP  rosuvastatin (CRESTOR) 20 MG tablet Take 1 tablet (20 mg total) by mouth daily. 05/24/22 08/22/22  [place], Raymond J, MD  sertraline (ZOLOFT) 25 MG tablet Take 1 tablet (25 mg total) by mouth daily. 01/18/22   [place], Blondell Reveal, MD  torsemide (DEMADEX) 20 MG tablet Take 1 tablet (20 mg total) by mouth 2 (two) times daily. 03/29/22   Lenna Sciara, NP      Allergies    Patient has no known allergies.    Review of Systems   Review of Systems  Constitutional:  Negative for fever.  Respiratory:  Positive for cough and shortness of breath.   Gastrointestinal:  Positive for nausea. Negative for vomiting.  Genitourinary:  Negative for dysuria.    Physical Exam Updated Vital Signs BP 119/77   Pulse 86   Temp 98.4 F (36.9 C) (Oral)   Resp 17   SpO2 98%  Physical Exam CONSTITUTIONAL: Disheveled, no acute distress HEAD: Normocephalic/atraumatic EYES: EOMI/PERRL ENMT: Mucous membranes moist NECK: supple no meningeal signs SPINE/BACK:entire spine nontender CV: S1/S2 noted, no murmurs/rubs/gallops noted LUNGS: Lungs are clear to auscultation bilaterally, no apparent distress ABDOMEN: soft, obese diffuse mild tenderness, no rebound or guarding, bowel sounds noted throughout abdomen GU:no cva  tenderness NEURO: Awake/alert, equal distal motor 5/5 strength noted with the following: hip flexion/knee flexion/extension, ankle dorsi/plantar flexion, great toe extension intact bilaterally, no sensory deficit in any dermatome. Pt is able to ambulate unassisted. EXTREMITIES: pulses normal/equal, full ROM SKIN: warm, color normal PSYCH: no abnormalities of mood noted, alert and oriented to situation  ED Results / Procedures / Treatments   Labs (all labs ordered are listed, but only abnormal results are displayed) Labs Reviewed  COMPREHENSIVE METABOLIC PANEL - Abnormal; Notable for the following components:      Result Value   Glucose, Bld 227 (*)    BUN 39 (*)    Creatinine, Ser 2.61 (*)    GFR, Estimated 29 (*)    All other components within normal limits  URINALYSIS, ROUTINE W REFLEX MICROSCOPIC - Abnormal; Notable for the following components:   Glucose, UA >=500 (*)    Protein, ur 100 (*)    All other components within normal limits  CBC WITH DIFFERENTIAL/PLATELET  LIPASE, BLOOD  CK  TROPONIN I (HIGH SENSITIVITY)    EKG EKG Interpretation  Date/Time:  Wednesday June 01 2022 20:58:55 EDT Ventricular Rate:  90 PR Interval:  158 QRS Duration: 72 QT Interval:  354 QTC Calculation: 433 R Axis:   2 Text Interpretation: Normal sinus rhythm Minimal voltage criteria for LVH, may be normal variant ( R in aVL ) Nonspecific ST and T wave abnormality Abnormal ECG When compared with ECG of 17-Apr-2022 13:36, No significant change was found Confirmed by Delora Fuel ([id number]) on 06/02/2022 1:10:26 AM  Radiology CT ABDOMEN PELVIS WO CONTRAST  Result Date: 06/02/2022 CLINICAL DATA:  Acute, nonlocalized abdominal pain EXAM: CT ABDOMEN AND PELVIS WITHOUT CONTRAST TECHNIQUE: Multidetector CT imaging of the abdomen and pelvis was performed following the standard protocol without IV contrast. RADIATION DOSE REDUCTION: This exam was performed according to the departmental dose-optimization  program which includes automated exposure control, adjustment of the mA and/or kV according to patient size and/or use of iterative reconstruction technique. COMPARISON:  08/07/2010 FINDINGS: Lower chest: Premature coronary atherosclerosis. 7 mm nodule in the subpleural left lower lobe. Hepatobiliary: No focal liver abnormality.Calcified gallstone. No evidence of gallbladder inflammation. Pancreas: Unremarkable. Spleen: Unremarkable. Adrenals/Urinary Tract: Negative adrenals. No hydronephrosis or ureteral stone. 3 mm left renal calculus. Unremarkable bladder. Stomach/Bowel:  No obstruction. No visible inflammation. Vascular/Lymphatic: No acute vascular abnormality. No mass or adenopathy. Reproductive:Vas deferens calcification usually associated with diabetes. Other: No ascites or pneumoperitoneum. Musculoskeletal: No acute abnormalities.  L5-S1 disc degeneration. IMPRESSION: 1. No acute finding. 2. 7 mm left lower lobe pulmonary nodule. Non-contrast chest CT at 6-12 months is recommended. If the nodule is stable at time of repeat CT, then future CT at 18-24 months (from today's scan) is considered optional for low-risk patients, but is recommended for high-risk patients. This recommendation follows the consensus statement:  Guidelines for Management of Incidental Pulmonary Nodules Detected on CT Images: From the Fleischner Society 2017; Radiology 2017; 284:228-243. 3. Cholelithiasis, left nephrolithiasis, and coronary atherosclerosis. Electronically Signed   By: Jorje Guild M.D.   On: 06/02/2022 06:05   DG Chest 2 View  Result Date: 06/01/2022 CLINICAL DATA:  Bilateral rib pain. EXAM: CHEST - 2 VIEW COMPARISON:  April 17, 2022 FINDINGS: The heart size and mediastinal contours are within normal limits. Low lung volumes are noted. Both lungs are clear. The visualized skeletal structures are unremarkable. IMPRESSION: No active cardiopulmonary disease. Electronically Signed   By: Virgina Norfolk M.D.   On:  06/01/2022 21:42    Procedures Procedures    Medications Ordered in ED Medications  sodium chloride 0.9 % bolus 1,000 mL (has no administration in time range)  ondansetron (ZOFRAN) injection 4 mg (has no administration in time range)  fentaNYL (SUBLIMAZE) injection 50 mcg (has no administration in time range)    ED Course/ Medical Decision Making/ A&P Clinical Course as of 06/02/22 0645  Thu Jun 02, 2022  0401 Creatinine(!): 2.61 Acute renal failure [DW]  0401 Glucose(!): 227 Hyperglycemia [DW]  0430 Patient is a very poor historian, presented with multiple complaints including chest pain, back pain.  Patient is in acute renal failure.  Patient is on diuretics and reports he has been compliant.  Due to persistent abdominal pain, will obtain CT imaging and admit  [DW]  0625 CT imaging negative for acute process.  Patient does have incidental finding of nodule that will need to be followed up on.  Patient was made aware of this, and will send message to PCP. [DW]  9021 Discussed with Dr. Bridgett Larsson for admission [DW]  [phone] Patient overall improved.  Low suspicion for ACS/PE/dissection as cause of chest pain.  Patient will need rehydration and monitoring of renal function.  Could be related to his diuretic use [DW]    Clinical Course User Index [DW] Ripley Fraise, MD                           Medical Decision Making Amount and/or Complexity of Data Reviewed Labs: ordered. Decision-making details documented in ED Course. Radiology: ordered.  Risk Prescription drug management. Decision regarding hospitalization.   This patient presents to the ED for concern of abdominal pain and chest pain, this involves an extensive number of treatment options, and is a complaint that carries with it a high risk of complications and morbidity.  The differential diagnosis includes but is not limited to cholecystitis, cholelithiasis, pancreatitis, gastritis, peptic ulcer disease, appendicitis, bowel  obstruction, bowel perforation, diverticulitis, AAA, ischemic bowel acute coronary syndrome, aortic dissection, pulmonary embolism, pericarditis, pneumothorax, pneumonia, myocarditis, pleurisy, esophageal rupture    Comorbidities that complicate the patient evaluation: Patient's presentation is complicated by their history of diabetes, hypertension  Social Determinants of Health: Patient's lack of insurance and poor health literacy   increases the complexity of managing their presentation  Additional history obtained: Records reviewed previous admission documents and Primary Care Documents  Lab Tests: I Ordered, and personally interpreted labs.  The pertinent results include: Acute renal failure, hyperglycemia  Imaging Studies ordered: I ordered imaging studies including X-ray chest   I independently visualized and interpreted imaging which showed no acute findings I agree with the radiologist interpretation  Cardiac Monitoring: The patient was maintained on a cardiac monitor.  I personally viewed and interpreted the cardiac monitor which showed an underlying rhythm of:  sinus rhythm  Medicines ordered and prescription drug management: I ordered medication including fentanyl for pain Reevaluation of the patient after these medicines showed that the patient    improved  Critical Interventions:  Marland KitchenIV fluids  Consultations Obtained: I requested consultation with the admitting physician triad dr Bridgett Larsson , and discussed  findings as well as pertinent plan - they recommend: admit  Reevaluation: After the interventions noted above, I reevaluated the patient and found that they have :improved  Complexity of problems addressed: Patient's presentation is most consistent with  acute presentation with potential threat to life or bodily function  Disposition: After consideration of the diagnostic results and the patient's response to treatment,  I feel that the patent would benefit from  admission   .            Final Clinical Impression(s) / ED Diagnoses Final diagnoses:  Generalized abdominal pain  AKI (acute kidney injury) ([place])  Lung nodule    Rx / DC Orders ED Discharge Orders     None         Ripley Fraise, MD 06/02/22 [phone]

## 2022-06-02 NOTE — H&P (Signed)
History and Physical    Patient: Leroy Simmons TDV:[id number] DOB: 29-Jan-1971 DOA: 06/01/2022 DOS: the patient was seen and examined on 06/02/2022 PCP: [place], Blondell Reveal, MD  Patient coming from: Home - lives in a boarding house; NOK: Leona Carry, [phone]   Chief Complaint: abdominal pain  HPI: Leroy Simmons is a 51 y.o. male with medical history significant of anxiety/depression, HTN, DM, HIV, and chronic diastolic CHF presenting with abdominal pain.  He reports that he had extreme back and chest pain.  He noticed it a lot at work, starting late Saturday.  It lasted for days and he was very uncomfortable and so decided to come here.  He thought it might have something to do with the kidneys.  He had been eating and drinking well.  He had previously told he had damage to the kidneys and was thinking it might be related.  He has been peeing "very heavily", is on Lasix, no recent change in dose.  The pain is much better currently.  He is feeling a bit loopy.      ER Course:  [place], per Dr Bridgett Larsson:  51 year old male admitted for acute renal failure.  Possible overdiuresis due to poor health literacy.     Review of Systems: As mentioned in the history of present illness. All other systems reviewed and are negative. Past Medical History:  Diagnosis Date   Anxiety    CHF (congestive heart failure) ([place])    Depression    Diabetes mellitus without complication ([place])    Hypertension    Past Surgical History:  Procedure Laterality Date   LEFT HEART CATH AND CORONARY ANGIOGRAPHY N/A 03/21/2022   Procedure: LEFT HEART CATH AND CORONARY ANGIOGRAPHY;  Surgeon: Jettie Booze, MD;  Location: [facility];  Service: Cardiovascular;  Laterality: N/A;   Social History:  reports that he has never smoked. He has never used smokeless tobacco. He reports current alcohol use. He reports that he does not use drugs.  No Known Allergies  Family History  Problem Relation Age  of Onset   Heart failure Mother     Prior to Admission medications   Medication Sig Start Date End Date Taking? Authorizing Provider  amLODipine (NORVASC) 5 MG tablet Take 1 tablet (5 mg total) by mouth daily. 05/24/22   [place], Raymond J, MD  aspirin 81 MG chewable tablet Chew 1 tablet (81 mg total) by mouth daily. 03/25/22 06/23/22  Timothy Lasso, MD  blood glucose meter kit and supplies Dispense based on patient and insurance preference. Use up to four times daily as directed. (FOR ICD-10 E10.9, E11.9). 05/24/22   [place], Blondell Reveal, MD  busPIRone (BUSPAR) 5 MG tablet Take 1.5 tablets (7.5 mg total) by mouth 2 (two) times daily. 05/24/22   [place], Blondell Reveal, MD  Emtricitabine-Tenofovir AF (DESCOVY PO) Take 1 tablet by mouth daily.    [provider]  ibuprofen (ADVIL) 200 MG tablet Take 200-400 mg by mouth daily as needed.    [provider]  insulin glargine (LANTUS) 100 unit/mL SOPN Inject 26 Units into the skin daily. 05/24/22   [place], Raymond J, MD  Insulin Pen Needle (PEN NEEDLES 3/16") 31G X 5 MM MISC 1 each by Does not apply route daily. 01/18/22   [place], Raymond J, MD  isosorbide mononitrate (IMDUR) 30 MG 24 hr tablet Take 0.5 tablets (15 mg total) by mouth daily. 03/29/22   Lenna Sciara, NP  JARDIANCE  10 MG TABS tablet Take 1 tablet (10 mg total) by mouth every morning. 05/24/22   [place], Blondell Reveal, MD  metFORMIN (GLUCOPHAGE-XR) 500 MG 24 hr tablet Take 4 tablets (2,000 mg total) by mouth at bedtime. 05/24/22   [place], Blondell Reveal, MD  metoprolol succinate (TOPROL-XL) 50 MG 24 hr tablet Take 1 tablet (50 mg total) by mouth daily. Take with or immediately following a meal. 05/24/22   [place], Blondell Reveal, MD  potassium chloride (KLOR-CON M) 10 MEQ tablet Take 1 tablet (10 mEq total) by mouth 2 (two) times daily. 03/29/22   Lenna Sciara, NP  rosuvastatin (CRESTOR) 20 MG tablet Take 1 tablet (20 mg total) by mouth daily. 05/24/22 08/22/22  [place], Raymond J, MD   sertraline (ZOLOFT) 25 MG tablet Take 1 tablet (25 mg total) by mouth daily. 01/18/22   [place], Blondell Reveal, MD  torsemide (DEMADEX) 20 MG tablet Take 1 tablet (20 mg total) by mouth 2 (two) times daily. 03/29/22   Lenna Sciara, NP    Physical Exam: Vitals:   06/02/22 0815 06/02/22 1431 06/02/22 1500 06/02/22 1600  BP: 121/82 (!) 105/56 117/84 116/75  Pulse: 73 78 80 71  Resp: 16 18 15 19   Temp:  98 F (36.7 C)    TempSrc:  Oral    SpO2: 96% 100% 97% 100%   General:  Appears calm and comfortable and is in NAD; mildly disheveled, sitting upright on the end of the bed Eyes:  EOMI, normal lids, iris ENT:  grossly normal hearing, lips & tongue, mmm; poor dentition Neck:  no LAD, masses or thyromegaly Cardiovascular:  RRR, no m/r/g. No LE edema.  Respiratory:   CTA bilaterally with no wheezes/rales/rhonchi.  Normal respiratory effort. Abdomen:  soft, NT, ND Skin:  no rash or induration seen on limited exam Musculoskeletal:  grossly normal tone BUE/BLE, good ROM, no bony abnormality Lower extremity:  No LE edema.  Limited foot exam with no ulcerations but suboptimal foot hygiene Psychiatric:  grossly normal mood and affect, speech fluent and appropriate, AOx3 Neurologic:  CN 2-12 grossly intact, moves all extremities in coordinated fashion   Radiological Exams on Admission: Independently reviewed - see discussion in A/P where applicable  CT ABDOMEN PELVIS WO CONTRAST  Result Date: 06/02/2022 CLINICAL DATA:  Acute, nonlocalized abdominal pain EXAM: CT ABDOMEN AND PELVIS WITHOUT CONTRAST TECHNIQUE: Multidetector CT imaging of the abdomen and pelvis was performed following the standard protocol without IV contrast. RADIATION DOSE REDUCTION: This exam was performed according to the departmental dose-optimization program which includes automated exposure control, adjustment of the mA and/or kV according to patient size and/or use of iterative reconstruction technique. COMPARISON:  08/07/2010  FINDINGS: Lower chest: Premature coronary atherosclerosis. 7 mm nodule in the subpleural left lower lobe. Hepatobiliary: No focal liver abnormality.Calcified gallstone. No evidence of gallbladder inflammation. Pancreas: Unremarkable. Spleen: Unremarkable. Adrenals/Urinary Tract: Negative adrenals. No hydronephrosis or ureteral stone. 3 mm left renal calculus. Unremarkable bladder. Stomach/Bowel:  No obstruction. No visible inflammation. Vascular/Lymphatic: No acute vascular abnormality. No mass or adenopathy. Reproductive:Vas deferens calcification usually associated with diabetes. Other: No ascites or pneumoperitoneum. Musculoskeletal: No acute abnormalities.  L5-S1 disc degeneration. IMPRESSION: 1. No acute finding. 2. 7 mm left lower lobe pulmonary nodule. Non-contrast chest CT at 6-12 months is recommended. If the nodule is stable at time of repeat CT, then future CT at 18-24 months (from today's scan) is considered optional for low-risk patients, but is recommended for high-risk patients. This  recommendation follows the consensus statement: Guidelines for Management of Incidental Pulmonary Nodules Detected on CT Images: From the Fleischner Society 2017; Radiology 2017; 284:228-243. 3. Cholelithiasis, left nephrolithiasis, and coronary atherosclerosis. Electronically Signed   By: Jorje Guild M.D.   On: 06/02/2022 06:05   DG Chest 2 View  Result Date: 06/01/2022 CLINICAL DATA:  Bilateral rib pain. EXAM: CHEST - 2 VIEW COMPARISON:  April 17, 2022 FINDINGS: The heart size and mediastinal contours are within normal limits. Low lung volumes are noted. Both lungs are clear. The visualized skeletal structures are unremarkable. IMPRESSION: No active cardiopulmonary disease. Electronically Signed   By: Virgina Norfolk M.D.   On: 06/01/2022 21:42    EKG: Independently reviewed.  NSR with rate 90; nonspecific ST changes with no evidence of acute ischemia   Labs on Admission: I have personally reviewed the  available labs and imaging studies at the time of the admission.  Pertinent labs:    Glucose 227 BUN 39/Creatinine 2.61/GFR 29; 21/1.05/>60 on 7/16 CK 84 HS troponin 17 Normal CBC UA: >500 glucose, 100 protein   Assessment and Plan: Principal Problem:   AKI (acute kidney injury) ([place]) Active Problems:   Essential hypertension   Diabetes mellitus ([place])   Hyperlipidemia   Anxiety and depression   Coronary atherosclerosis of native coronary artery   Chronic diastolic CHF (congestive heart failure) ([facility])   Pulmonary nodule    AKI -Patient without baseline compromised renal function  -Current creatinine is increased at least 1.5 times compared to baseline and presumed to have occurred within the last 7 days -Likely due to prerenal due to overdiuresis in the setting of potentially nephrotoxic medications (Advil, Lasix, Jardiance, lisinopril, glucophage) -IVF -Check FeNa and US-renal -Follow up renal function by BMP -Avoid ACEI and NSAIDs  -Will observe for now on Med Surg  DM -Recent A1c was 9.8, indicating poor control -hold Glucophage, Jardiance -Continue glargine -Cover with moderate-scale SSI   HTN -Continue amlodipine, Toprol XL -Hold Lisinopril  HLD -Continue Crestor  Anxiety/depression -Continue buspirone, sertraline  Chronic diastolic CHF -Preserved EF on echo on 6/15 -He has been taking torsemide -> Lasix for this issue -He appears to be have been overdiuresed -Likely needs to stop or just take prn at this time  Pulmonary nodule -46m LLL nodule incidentally noted on CT -Needs f/u imaging in 6-12 months  CAD -Cath on 6/19 with nonobstructive CAD -Continue ASA -He reports no longer taking Imdur  ?HIV prophylaxis -Descovy was previously listed on his med rec -Possibly related to pre-exposure or post-exposure prophylaxis -He does not appear to be taking this medication at this time -He was negative on 03/18/22     Advance Care Planning:   Code  Status: Full Code   Consults: TOC team; nutrition  DVT Prophylaxis: Lovenox  Family Communication: None present; he is capable of communicating with family at this time  Severity of Illness: The appropriate patient status for this patient is OBSERVATION. Observation status is judged to be reasonable and necessary in order to provide the required intensity of service to ensure the patient's safety. The patient's presenting symptoms, physical exam findings, and initial radiographic and laboratory data in the context of their medical condition is felt to place them at decreased risk for further clinical deterioration. Furthermore, it is anticipated that the patient will be medically stable for discharge from the hospital within 2 midnights of admission.   Author: JKarmen Bongo MD 06/02/2022 5:32 PM  For on call review [email or web address]

## 2022-06-02 NOTE — ED Notes (Signed)
Pt placed on 2L [place] while sleeping due to spo2 87%.

## 2022-06-02 NOTE — ED Notes (Signed)
ED TO INPATIENT HANDOFF REPORT  ED Nurse Name and Phone #: Bobbiejo Ishikawa [phone]  S Name/Age/Gender Leroy Simmons 51 y.o. male Room/Bed: 044C/044C  Code Status   Code Status: Full Code  Home/SNF/Other Home Patient oriented to: self, place, time, and situation Is this baseline? Yes   Triage Complete: Triage complete  Chief Complaint AKI (acute kidney injury) (HCC) [N17.9]  Triage Note Pt c/o upper abdominal pain with nausea, shortness of breath and dizziness that started Saturday.    Allergies No Known Allergies  Level of Care/Admitting Diagnosis ED Disposition     ED Disposition  Admit   Condition  --   Comment  Hospital Area: [facility] [[id number]]  Level of Care: Med-Surg [16]  May place patient in observation at [facility] or [facility] if equivalent level of care is available:: No  Covid Evaluation: Asymptomatic - no recent exposure (last 10 days) testing not required  Diagnosis: AKI (acute kidney injury) [facility]) [016010]  Admitting Physician: Magnus Ivan [[id number]]  Attending Physician: Magnus Ivan [[id number]]          B Medical/Surgery History Past Medical History:  Diagnosis Date   Anxiety    CHF (congestive heart failure) (HCC)    Depression    Diabetes mellitus without complication (HCC)    Hypertension    Past Surgical History:  Procedure Laterality Date   LEFT HEART CATH AND CORONARY ANGIOGRAPHY N/A 03/21/2022   Procedure: LEFT HEART CATH AND CORONARY ANGIOGRAPHY;  Surgeon: Corky Crafts, MD;  Location: [facility] INVASIVE CV LAB;  Service: Cardiovascular;  Laterality: N/A;     A IV Location/Drains/Wounds Patient Lines/Drains/Airways Status     Active Line/Drains/Airways     Name Placement date Placement time Site Days   Peripheral IV 06/02/22 20 G Right Antecubital 06/02/22  0425  Antecubital  less than 1            Intake/Output Last 24 hours No intake or output data in the 24 hours ending 06/02/22  1533  Labs/Imaging Results for orders placed or performed during the hospital encounter of 06/01/22 (from the past 48 hour(s))  CBC with Differential     Status: None   Collection Time: 06/01/22  9:03 PM  Result Value Ref Range   WBC 8.4 4.0 - 10.5 K/uL   RBC 4.51 4.22 - 5.81 MIL/uL   Hemoglobin 13.8 13.0 - 17.0 g/dL   HCT 27.0 62.3 - 76.2 %   MCV 92.2 80.0 - 100.0 fL   MCH 30.6 26.0 - 34.0 pg   MCHC 33.2 30.0 - 36.0 g/dL   RDW 83.1 51.7 - 61.6 %   Platelets 283 150 - 400 K/uL   nRBC 0.0 0.0 - 0.2 %   Neutrophils Relative % 58 %   Neutro Abs 4.9 1.7 - 7.7 K/uL   Lymphocytes Relative 33 %   Lymphs Abs 2.7 0.7 - 4.0 K/uL   Monocytes Relative 7 %   Monocytes Absolute 0.6 0.1 - 1.0 K/uL   Eosinophils Relative 1 %   Eosinophils Absolute 0.1 0.0 - 0.5 K/uL   Basophils Relative 1 %   Basophils Absolute 0.0 0.0 - 0.1 K/uL   Immature Granulocytes 0 %   Abs Immature Granulocytes 0.01 0.00 - 0.07 K/uL    Comment: Performed at [facility], [address], [place], [place] [address]  Comprehensive metabolic panel     Status: Abnormal   Collection Time: 06/01/22  9:03 PM  Result Value Ref Range  Sodium 136 135 - 145 mmol/L   Potassium 4.3 3.5 - 5.1 mmol/L   Chloride 101 98 - 111 mmol/L   CO2 26 22 - 32 mmol/L   Glucose, Bld 227 (H) 70 - 99 mg/dL    Comment: Glucose reference range applies only to samples taken after fasting for at least 8 hours.   BUN 39 (H) 6 - 20 mg/dL   Creatinine, Ser 0.73 (H) 0.61 - 1.24 mg/dL   Calcium 9.0 8.9 - 71.0 mg/dL   Total Protein 6.9 6.5 - 8.1 g/dL   Albumin 3.9 3.5 - 5.0 g/dL   AST 28 15 - 41 U/L   ALT 27 0 - 44 U/L   Alkaline Phosphatase 90 38 - 126 U/L   Total Bilirubin 0.8 0.3 - 1.2 mg/dL   GFR, Estimated 29 (L) >60 mL/min    Comment: (NOTE) Calculated using the CKD-EPI Creatinine Equation (2021)    Anion gap 9 5 - 15    Comment: Performed at [facility], [address], [place], [place] [address]  Lipase, blood     Status:  None   Collection Time: 06/01/22  9:03 PM  Result Value Ref Range   Lipase 42 11 - 51 U/L    Comment: Performed at [facility], [address], [place], [place] [address]  CK     Status: None   Collection Time: 06/02/22  4:29 AM  Result Value Ref Range   Total CK 84 49 - 397 U/L    Comment: Performed at [facility], [address], [place], [place] [address]  Troponin I (High Sensitivity)     Status: None   Collection Time: 06/02/22  4:29 AM  Result Value Ref Range   Troponin I (High Sensitivity) 17 <18 ng/L    Comment: (NOTE) Elevated high sensitivity troponin I (hsTnI) values and significant  changes across serial measurements may suggest ACS but many other  chronic and acute conditions are known to elevate hsTnI results.  Refer to the "Links" section for chest pain algorithms and additional  guidance. Performed at [facility], [address] [place], [place] [address]   Urinalysis, Routine w reflex microscopic     Status: Abnormal   Collection Time: 06/02/22  4:33 AM  Result Value Ref Range   Color, Urine YELLOW YELLOW   APPearance CLEAR CLEAR   Specific Gravity, Urine 1.021 1.005 - 1.030   pH 5.0 5.0 - 8.0   Glucose, UA >=500 (A) NEGATIVE mg/dL   Hgb urine dipstick NEGATIVE NEGATIVE   Bilirubin Urine NEGATIVE NEGATIVE   Ketones, ur NEGATIVE NEGATIVE mg/dL   Protein, ur 829 (A) NEGATIVE mg/dL   Nitrite NEGATIVE NEGATIVE   Leukocytes,Ua NEGATIVE NEGATIVE   RBC / HPF 0-5 0 - 5 RBC/hpf   WBC, UA 0-5 0 - 5 WBC/hpf   Bacteria, UA NONE SEEN NONE SEEN   Hyaline Casts, UA PRESENT    Granular Casts, UA PRESENT     Comment: Performed at [facility], [address] [place], [place] [address]  CBG monitoring, ED     Status: Abnormal   Collection Time: 06/02/22  3:10 PM  Result Value Ref Range   Glucose-Capillary 195 (H) 70 - 99 mg/dL    Comment: Glucose reference range applies only to samples taken after fasting for at least 8 hours.   CT ABDOMEN  PELVIS WO CONTRAST  Result Date: 06/02/2022 CLINICAL DATA:  Acute, nonlocalized abdominal  pain EXAM: CT ABDOMEN AND PELVIS WITHOUT CONTRAST TECHNIQUE: Multidetector CT imaging of the abdomen and pelvis was performed following the standard protocol without IV contrast. RADIATION DOSE REDUCTION: This exam was performed according to the departmental dose-optimization program which includes automated exposure control, adjustment of the mA and/or kV according to patient size and/or use of iterative reconstruction technique. COMPARISON:  08/07/2010 FINDINGS: Lower chest: Premature coronary atherosclerosis. 7 mm nodule in the subpleural left lower lobe. Hepatobiliary: No focal liver abnormality.Calcified gallstone. No evidence of gallbladder inflammation. Pancreas: Unremarkable. Spleen: Unremarkable. Adrenals/Urinary Tract: Negative adrenals. No hydronephrosis or ureteral stone. 3 mm left renal calculus. Unremarkable bladder. Stomach/Bowel:  No obstruction. No visible inflammation. Vascular/Lymphatic: No acute vascular abnormality. No mass or adenopathy. Reproductive:Vas deferens calcification usually associated with diabetes. Other: No ascites or pneumoperitoneum. Musculoskeletal: No acute abnormalities.  L5-S1 disc degeneration. IMPRESSION: 1. No acute finding. 2. 7 mm left lower lobe pulmonary nodule. Non-contrast chest CT at 6-12 months is recommended. If the nodule is stable at time of repeat CT, then future CT at 18-24 months (from today's scan) is considered optional for low-risk patients, but is recommended for high-risk patients. This recommendation follows the consensus statement: Guidelines for Management of Incidental Pulmonary Nodules Detected on CT Images: From the Fleischner Society 2017; Radiology 2017; 284:228-243. 3. Cholelithiasis, left nephrolithiasis, and coronary atherosclerosis. Electronically Signed   By: Tiburcio Pea M.D.   On: 06/02/2022 06:05   DG Chest 2 View  Result Date:  06/01/2022 CLINICAL DATA:  Bilateral rib pain. EXAM: CHEST - 2 VIEW COMPARISON:  April 17, 2022 FINDINGS: The heart size and mediastinal contours are within normal limits. Low lung volumes are noted. Both lungs are clear. The visualized skeletal structures are unremarkable. IMPRESSION: No active cardiopulmonary disease. Electronically Signed   By: Aram Candela M.D.   On: 06/01/2022 21:42    Pending Labs Unresulted Labs (From admission, onward)     Start     Ordered   06/03/22 0500  Basic metabolic panel  Tomorrow morning,   R        06/02/22 0903   06/03/22 0500  CBC  Tomorrow morning,   R        06/02/22 0903            Vitals/Pain Today's Vitals   06/02/22 0706 06/02/22 0815 06/02/22 1431 06/02/22 1513  BP:  121/82 (!) 105/56   Pulse:  73 78   Resp:  16 18   Temp:   98 F (36.7 C)   TempSrc:   Oral   SpO2:  96% 100%   PainSc: Asleep   0-No pain    Isolation Precautions No active isolations  Medications Medications  aspirin chewable tablet 81 mg (81 mg Oral Not Given 06/02/22 1415)  emtricitabine-tenofovir AF (DESCOVY) 200-25 MG per tablet 1 tablet (1 tablet Oral Not Given 06/02/22 1415)  amLODipine (NORVASC) tablet 5 mg (5 mg Oral Not Given 06/02/22 1415)  isosorbide mononitrate (IMDUR) 24 hr tablet 15 mg (15 mg Oral Not Given 06/02/22 1416)  metoprolol succinate (TOPROL-XL) 24 hr tablet 50 mg (50 mg Oral Not Given 06/02/22 1416)  rosuvastatin (CRESTOR) tablet 20 mg (20 mg Oral Not Given 06/02/22 1414)  busPIRone (BUSPAR) tablet 7.5 mg (7.5 mg Oral Not Given 06/02/22 1414)  sertraline (ZOLOFT) tablet 25 mg (25 mg Oral Not Given 06/02/22 1414)  insulin aspart (novoLOG) injection 0-15 Units ( Subcutaneous Not Given 06/02/22 1411)  insulin aspart (novoLOG) injection 0-5 Units (has no administration in time  range)  lactated ringers infusion ( Intravenous Rate/Dose Change 06/02/22 1415)  acetaminophen (TYLENOL) tablet 650 mg (has no administration in time range)    Or   acetaminophen (TYLENOL) suppository 650 mg (has no administration in time range)  docusate sodium (COLACE) capsule 100 mg (100 mg Oral Not Given 06/02/22 1413)  polyethylene glycol (MIRALAX / GLYCOLAX) packet 17 g (has no administration in time range)  bisacodyl (DULCOLAX) EC tablet 5 mg (has no administration in time range)  ondansetron (ZOFRAN) tablet 4 mg (has no administration in time range)    Or  ondansetron (ZOFRAN) injection 4 mg (has no administration in time range)  hydrALAZINE (APRESOLINE) injection 5 mg (has no administration in time range)  enoxaparin (LOVENOX) injection 40 mg (40 mg Subcutaneous Not Given 06/02/22 1413)  insulin glargine-yfgn (SEMGLEE) injection 26 Units (26 Units Subcutaneous Given 06/02/22 1511)  sodium chloride 0.9 % bolus 1,000 mL (0 mLs Intravenous Stopped 06/02/22 0652)  ondansetron (ZOFRAN) injection 4 mg (4 mg Intravenous Given 06/02/22 0426)  fentaNYL (SUBLIMAZE) injection 50 mcg (50 mcg Intravenous Given 06/02/22 0427)    Mobility walks Low fall risk    R Recommendations: See Admitting Provider Note  Report given to:   Additional Notes:

## 2022-06-02 NOTE — ED Notes (Signed)
Patient transported to CT 

## 2022-06-03 LAB — CBC
HCT: 40.5 % (ref 39.0–52.0)
Hemoglobin: 14.4 g/dL (ref 13.0–17.0)
MCH: 31 pg (ref 26.0–34.0)
MCHC: 35.6 g/dL (ref 30.0–36.0)
MCV: 87.1 fL (ref 80.0–100.0)
Platelets: 230 10*3/uL (ref 150–400)
RBC: 4.65 MIL/uL (ref 4.22–5.81)
RDW: 11.6 % (ref 11.5–15.5)
WBC: 7.4 10*3/uL (ref 4.0–10.5)
nRBC: 0 % (ref 0.0–0.2)

## 2022-06-03 LAB — BASIC METABOLIC PANEL
Anion gap: 8 (ref 5–15)
BUN: 28 mg/dL — ABNORMAL HIGH (ref 6–20)
CO2: 25 mmol/L (ref 22–32)
Calcium: 9 mg/dL (ref 8.9–10.3)
Chloride: 106 mmol/L (ref 98–111)
Creatinine, Ser: 1.32 mg/dL — ABNORMAL HIGH (ref 0.61–1.24)
GFR, Estimated: >60 mL/min (ref >60)
Glucose, Bld: 140 mg/dL — ABNORMAL HIGH (ref 70–99)
Potassium: 4.3 mmol/L (ref 3.5–5.1)
Sodium: 139 mmol/L (ref 135–145)

## 2022-06-03 LAB — GLUCOSE, CAPILLARY
Glucose-Capillary: 97 mg/dL (ref 70–99)
Glucose-Capillary: 145 mg/dL — ABNORMAL HIGH (ref 70–99)

## 2022-06-03 MED ORDER — FUROSEMIDE 40 MG PO TABS: 40.0000 mg | ORAL_TABLET | Freq: Every day | ORAL | Status: DC

## 2022-06-03 NOTE — TOC Transition Note (Signed)
Transition of Care [facility]) - CM/SW Discharge Note   Patient Details  Name: Leroy Simmons MRN: [id number] Date of Birth: 02/04/1971  [facility] CM/SW Contact:  Tom-Johnson, Hershal Coria, RN Phone Number: 06/03/2022, 3:00 PM   Clinical Narrative:     Patient is scheduled for discharge today. CM consulted for PCP needs. Patient states is active with Dr. De [place] but would like to change PCP, prefers another practice.  CM called Dr. Tommi Rumps [place]'s office [phone]) to speak with someone about patient's concerns, and the office is closed at this time. CM called and schedule hospital f/u appointment with [facility], info on AVS.  Cab voucher given to RN. No further TOC needs noted.    Final next level of care: Home/Self Care Barriers to Discharge: Barriers Resolved   Patient Goals and CMS Choice Patient states their goals for this hospitalization and ongoing recovery are:: To return home CMS Medicare.gov Compare Post Acute Care list provided to:: Patient Choice offered to / list presented to : Patient  Discharge Placement                Patient to be transferred to facility by: [facility]      Discharge Plan and Services                DME Arranged: N/A DME Agency: NA       HH Arranged: NA HH Agency: NA        Social Determinants of Health (SDOH) Interventions     Readmission Risk Interventions     No data to display

## 2022-06-03 NOTE — Discharge Instructions (Signed)
Leroy Simmons,  You were in the hospital with worsening function of your kidney. This appears to be secondary to dehydration and has improved with IV fluids. Please hold your lisinopril and reduce your Lasix to daily. Please follow-up with your PCP and Cardiologist on when to resume your medications as previously prescribed. It would be good for you to have a repeat metabolic panel in about 1 week; your doctor can set this up.

## 2022-06-03 NOTE — Progress Notes (Signed)
DISCHARGE NOTE HOME [facility]ISS[facility] RESOR to be discharged Home per MD order. Discussed prescriptions and follow up appointments with the patient. Prescriptions given to patient; medication list explained in detail. Patient verbalized understanding.  Skin clean, dry and intact without evidence of skin break down, no evidence of skin tears noted. IV catheter discontinued intact. Site without signs and symptoms of complications. Dressing and pressure applied. Pt denies pain at the site currently. No complaints noted.  Patient free of lines, drains, and wounds.   An After Visit Summary (AVS) was printed and given to the patient. Patient escorted via wheelchair, and discharged home via private auto.  Myrtis Hopping, RN

## 2022-06-03 NOTE — Discharge Summary (Signed)
Physician Discharge Summary   Patient: Leroy Simmons MRN: [id number] DOB: 1971/06/20  Admit date:     06/01/2022  Discharge date: 06/03/22  Discharge Physician: Cordelia Poche, MD   PCP: [place], Blondell Reveal, MD   Recommendations at discharge:  PCP/Cardiology follow-up Hold lisinopril and decreased Lasix. Will need to readdress these prescriptions Repeat BMP in 3-5 days Repeat chest CT in 6 to 12 months for evaluation of incidental 7 mm left lower lobe pulmonary nodule Recommend patient to be scheduled for outpatient ABI  Discharge Diagnoses: Principal Problem:   AKI (acute kidney injury) ([place]) Active Problems:   Essential hypertension   Diabetes mellitus ([place])   Hyperlipidemia   Anxiety and depression   Coronary atherosclerosis of native coronary artery   Chronic diastolic CHF (congestive heart failure) ([place])   Pulmonary nodule  Resolved Problems:   * No resolved hospital problems. *  Hospital Course: Leroy Simmons a 51 y.o. male with a medical history of anxiety, depression, diabetes mellitus, chronic diastolic heart failure, HIV. Patient presented secondary to abdominal pain and was found to have evidence of AKI likely secondary to dehydration in setting of Lasix and lisinopril use.  Known baseline creatinine of 1.1.  Patient presented with a creatinine of 2.61 and was started on IV fluids.  Lisinopril and Lasix held.  Patient's creatinine improved overnight with a creatinine of 1.32 on day of discharge.  Lisinopril held on discharge.  Recommendation to decrease to Lasix daily.  Patient structured to watch weights daily and follow-up with his cardiologist if he notices a 3-5 pound gain in 24 to 48 hours.  Incidental was found to have a 7 mm left lower lobe pulmonary nodule with recommendations for follow-up noncontrast CT at 6 to 12 months.  Patient was also noted to have cool extremities without evidence of claudication.  Recommendation for outpatient ABIs likely vascular surgery  follow-up.   Consultants: None Procedures performed: None Disposition: Home Diet recommendation: Carb modified diet  DISCHARGE MEDICATION: Allergies as of 06/03/2022   No Known Allergies      Medication List     STOP taking these medications    ibuprofen 200 MG tablet Commonly known as: ADVIL   lisinopril 10 MG tablet Commonly known as: ZESTRIL       TAKE these medications    amLODipine 5 MG tablet Commonly known as: NORVASC Take 1 tablet (5 mg total) by mouth daily.   aspirin 81 MG chewable tablet Chew 1 tablet (81 mg total) by mouth daily.   blood glucose meter kit and supplies Dispense based on patient and insurance preference. Use up to four times daily as directed. (FOR ICD-10 E10.9, E11.9).   busPIRone 5 MG tablet Commonly known as: BUSPAR Take 1.5 tablets (7.5 mg total) by mouth 2 (two) times daily. What changed: how much to take   furosemide 40 MG tablet Commonly known as: LASIX Take 1 tablet (40 mg total) by mouth daily. Start taking on: June 06, 2022 What changed:  when to take this These instructions start on June 06, 2022. If you are unsure what to do until then, ask your doctor or other care provider.   isosorbide mononitrate 30 MG 24 hr tablet Commonly known as: IMDUR Take 0.5 tablets (15 mg total) by mouth daily.   Jardiance 10 MG Tabs tablet Generic drug: empagliflozin Take 1 tablet (10 mg total) by mouth every morning.   Lantus SoloStar 100 UNIT/ML Solostar Pen Generic drug: insulin glargine Inject 26 Units into  the skin daily. What changed: when to take this   metFORMIN 500 MG 24 hr tablet Commonly known as: GLUCOPHAGE-XR Take 4 tablets (2,000 mg total) by mouth at bedtime. What changed: how much to take   metoprolol succinate 50 MG 24 hr tablet Commonly known as: TOPROL-XL Take 1 tablet (50 mg total) by mouth daily. Take with or immediately following a meal. What changed: additional instructions   rosuvastatin 20 MG  tablet Commonly known as: CRESTOR Take 1 tablet (20 mg total) by mouth daily.   sertraline 25 MG tablet Commonly known as: ZOLOFT Take 1 tablet (25 mg total) by mouth daily.   Unifine Pentips 31G X 5 MM Misc Generic drug: Insulin Pen Needle Use daily with insulin pen. (1 each by Does not apply route daily.)        Follow-up Information     [place], Blondell Reveal, MD. Schedule an appointment as soon as possible for a visit in 1 week(s).   Specialty: Family Medicine Why: For hospital follow-up Contact information: [address] [place] [address] [phone]                Discharge Exam: BP 113/78 (BP Location: Right Arm)   Pulse 79   Temp 98.9 F (37.2 C) (Oral)   Resp 16   SpO2 97%   General exam: Appears calm and comfortable Respiratory system: Clear to auscultation. Respiratory effort normal. Cardiovascular system: S1 & S2 heard, RRR. Decreased bilateral pedal pulses Central nervous system: Alert and oriented. No focal neurological deficits. Musculoskeletal: No edema. No calf tenderness Skin: No cyanosis. No rashes. Bilateral lower feet are cool.  Psychiatry: Judgement and insight appear normal. Mood & affect appropriate.   Condition at discharge: stable  The results of significant diagnostics from this hospitalization (including imaging, microbiology, ancillary and laboratory) are listed below for reference.   Imaging Studies: US RENAL  Result Date: 06/02/2022 CLINICAL DATA:  Acute kidney injury EXAM: RENAL / URINARY TRACT ULTRASOUND COMPLETE COMPARISON:  None Available. FINDINGS: Right Kidney: Renal measurements: 11.7 x 5.3 x 5.1 cm = volume: 165 mL. Echogenicity within normal limits. Small simple cyst at the lower pole. No follow-up required. No hydronephrosis visualized. Left Kidney: Renal measurements: 11.8 x 5.3 x 4.7 cm = volume: 153 mL. Echogenicity within normal limits. No mass or hydronephrosis visualized. Bladder: Appears normal for degree  of bladder distention. Other: None. IMPRESSION: No hydronephrosis. Electronically Signed   By: Macy Mis M.D.   On: 06/02/2022 17:42   CT ABDOMEN PELVIS WO CONTRAST  Result Date: 06/02/2022 CLINICAL DATA:  Acute, nonlocalized abdominal pain EXAM: CT ABDOMEN AND PELVIS WITHOUT CONTRAST TECHNIQUE: Multidetector CT imaging of the abdomen and pelvis was performed following the standard protocol without IV contrast. RADIATION DOSE REDUCTION: This exam was performed according to the departmental dose-optimization program which includes automated exposure control, adjustment of the mA and/or kV according to patient size and/or use of iterative reconstruction technique. COMPARISON:  08/07/2010 FINDINGS: Lower chest: Premature coronary atherosclerosis. 7 mm nodule in the subpleural left lower lobe. Hepatobiliary: No focal liver abnormality.Calcified gallstone. No evidence of gallbladder inflammation. Pancreas: Unremarkable. Spleen: Unremarkable. Adrenals/Urinary Tract: Negative adrenals. No hydronephrosis or ureteral stone. 3 mm left renal calculus. Unremarkable bladder. Stomach/Bowel:  No obstruction. No visible inflammation. Vascular/Lymphatic: No acute vascular abnormality. No mass or adenopathy. Reproductive:Vas deferens calcification usually associated with diabetes. Other: No ascites or pneumoperitoneum. Musculoskeletal: No acute abnormalities.  L5-S1 disc degeneration. IMPRESSION: 1. No acute finding. 2. 7 mm left lower  lobe pulmonary nodule. Non-contrast chest CT at 6-12 months is recommended. If the nodule is stable at time of repeat CT, then future CT at 18-24 months (from today's scan) is considered optional for low-risk patients, but is recommended for high-risk patients. This recommendation follows the consensus statement: Guidelines for Management of Incidental Pulmonary Nodules Detected on CT Images: From the Fleischner Society 2017; Radiology 2017; 284:228-243. 3. Cholelithiasis, left  nephrolithiasis, and coronary atherosclerosis. Electronically Signed   By: Jorje Guild M.D.   On: 06/02/2022 06:05   DG Chest 2 View  Result Date: 06/01/2022 CLINICAL DATA:  Bilateral rib pain. EXAM: CHEST - 2 VIEW COMPARISON:  April 17, 2022 FINDINGS: The heart size and mediastinal contours are within normal limits. Low lung volumes are noted. Both lungs are clear. The visualized skeletal structures are unremarkable. IMPRESSION: No active cardiopulmonary disease. Electronically Signed   By: Virgina Norfolk M.D.   On: 06/01/2022 21:42    Microbiology: Results for orders placed or performed during the hospital encounter of 03/16/22  MRSA Next Gen by PCR, Nasal     Status: None   Collection Time: 03/17/22  8:25 PM   Specimen: Nasal Mucosa; Nasal Swab  Result Value Ref Range Status   MRSA by PCR Next Gen NOT DETECTED NOT DETECTED Final    Comment: (NOTE) The GeneXpert MRSA Assay (FDA approved for NASAL specimens only), is one component of a comprehensive MRSA colonization surveillance program. It is not intended to diagnose MRSA infection nor to guide or monitor treatment for MRSA infections. Test performance is not FDA approved in patients less than 50 years old. Performed at [facility], [place] [address] [place], [place] [address]     Labs: CBC: Recent Labs  Lab 06/01/22 2103 06/03/22 0932  WBC 8.4 7.4  NEUTROABS 4.9  --   HGB 13.8 14.4  HCT 41.6 40.5  MCV 92.2 87.1  PLT 283 947   Basic Metabolic Panel: Recent Labs  Lab 06/01/22 2103 06/03/22 0932  NA 136 139  K 4.3 4.3  CL 101 106  CO2 26 25  GLUCOSE 227* 140*  BUN 39* 28*  CREATININE 2.61* 1.32*  CALCIUM 9.0 9.0   Liver Function Tests: Recent Labs  Lab 06/01/22 2103  AST 28  ALT 27  ALKPHOS 90  BILITOT 0.8  PROT 6.9  ALBUMIN 3.9   CBG: Recent Labs  Lab 06/02/22 1510 06/02/22 1657 06/02/22 2156 06/03/22 0827 06/03/22 1114  GLUCAP 195* 157* 137* 97 145*    Discharge time spent: 35  minutes.  Signed: Cordelia Poche, MD [facility] 06/03/2022

## 2022-06-03 NOTE — Plan of Care (Signed)

## 2022-06-14 ENCOUNTER — Other Ambulatory Visit (HOSPITAL_BASED_OUTPATIENT_CLINIC_OR_DEPARTMENT_OTHER): Payer: Self-pay | Admitting: Family Medicine

## 2022-06-14 DIAGNOSIS — F32A Depression, unspecified: Secondary | ICD-10-CM

## 2022-06-22 ENCOUNTER — Ambulatory Visit (HOSPITAL_BASED_OUTPATIENT_CLINIC_OR_DEPARTMENT_OTHER): Payer: Self-pay | Admitting: Family Medicine

## 2022-07-28 ENCOUNTER — Other Ambulatory Visit: Payer: Self-pay

## 2022-07-28 DIAGNOSIS — E119 Type 2 diabetes mellitus without complications: Secondary | ICD-10-CM | POA: Insufficient documentation

## 2022-07-28 DIAGNOSIS — Z79899 Other long term (current) drug therapy: Secondary | ICD-10-CM | POA: Insufficient documentation

## 2022-07-28 DIAGNOSIS — L03116 Cellulitis of left lower limb: Secondary | ICD-10-CM | POA: Insufficient documentation

## 2022-07-28 DIAGNOSIS — Z7984 Long term (current) use of oral hypoglycemic drugs: Secondary | ICD-10-CM | POA: Insufficient documentation

## 2022-07-28 DIAGNOSIS — I5032 Chronic diastolic (congestive) heart failure: Secondary | ICD-10-CM | POA: Insufficient documentation

## 2022-07-28 DIAGNOSIS — I110 Hypertensive heart disease with heart failure: Secondary | ICD-10-CM | POA: Insufficient documentation

## 2022-07-28 DIAGNOSIS — Z794 Long term (current) use of insulin: Secondary | ICD-10-CM | POA: Insufficient documentation

## 2022-07-29 ENCOUNTER — Emergency Department (HOSPITAL_BASED_OUTPATIENT_CLINIC_OR_DEPARTMENT_OTHER): Payer: Self-pay

## 2022-07-29 ENCOUNTER — Emergency Department (HOSPITAL_COMMUNITY): Payer: Self-pay

## 2022-07-29 ENCOUNTER — Encounter (HOSPITAL_COMMUNITY): Payer: Self-pay | Admitting: Emergency Medicine

## 2022-07-29 ENCOUNTER — Emergency Department (HOSPITAL_COMMUNITY)
Admission: EM | Admit: 2022-07-29 | Discharge: 2022-07-29 | Disposition: A | Discharge status: Home / Self Care | Payer: Self-pay | Attending: Emergency Medicine | Admitting: Emergency Medicine

## 2022-07-29 DIAGNOSIS — L03116 Cellulitis of left lower limb: Secondary | ICD-10-CM

## 2022-07-29 DIAGNOSIS — M7989 Other specified soft tissue disorders: Secondary | ICD-10-CM

## 2022-07-29 DIAGNOSIS — R600 Localized edema: Secondary | ICD-10-CM

## 2022-07-29 DIAGNOSIS — R609 Edema, unspecified: Secondary | ICD-10-CM

## 2022-07-29 LAB — COMPREHENSIVE METABOLIC PANEL
ALT: 18 U/L (ref 0–44)
AST: 14 U/L — ABNORMAL LOW (ref 15–41)
Albumin: 3.2 g/dL — ABNORMAL LOW (ref 3.5–5.0)
Alkaline Phosphatase: 86 U/L (ref 38–126)
Anion gap: 8 (ref 5–15)
BUN: 29 mg/dL — ABNORMAL HIGH (ref 6–20)
CO2: 25 mmol/L (ref 22–32)
Calcium: 9 mg/dL (ref 8.9–10.3)
Chloride: 107 mmol/L (ref 98–111)
Creatinine, Ser: 1.34 mg/dL — ABNORMAL HIGH (ref 0.61–1.24)
GFR, Estimated: >60 mL/min (ref >60)
Glucose, Bld: 151 mg/dL — ABNORMAL HIGH (ref 70–99)
Potassium: 3.8 mmol/L (ref 3.5–5.1)
Sodium: 140 mmol/L (ref 135–145)
Total Bilirubin: 0.7 mg/dL (ref 0.3–1.2)
Total Protein: 6.9 g/dL (ref 6.5–8.1)

## 2022-07-29 LAB — CBC
HCT: 40.4 % (ref 39.0–52.0)
Hemoglobin: 13 g/dL (ref 13.0–17.0)
MCH: 29.9 pg (ref 26.0–34.0)
MCHC: 32.2 g/dL (ref 30.0–36.0)
MCV: 92.9 fL (ref 80.0–100.0)
Platelets: 315 10*3/uL (ref 150–400)
RBC: 4.35 MIL/uL (ref 4.22–5.81)
RDW: 11.9 % (ref 11.5–15.5)
WBC: 7.7 10*3/uL (ref 4.0–10.5)
nRBC: 0 % (ref 0.0–0.2)

## 2022-07-29 LAB — BRAIN NATRIURETIC PEPTIDE: B Natriuretic Peptide: 54 pg/mL (ref 0.0–100.0)

## 2022-07-29 MED ORDER — FUROSEMIDE 10 MG/ML IJ SOLN
40.0000 mg | Freq: Once | INTRAMUSCULAR | Status: AC
  Administered 2022-07-29: 40 mg via INTRAVENOUS
  Filled 2022-07-29: qty 4

## 2022-07-29 MED ORDER — DOXYCYCLINE HYCLATE 100 MG PO TABS
100.0000 mg | ORAL_TABLET | Freq: Once | ORAL | Status: AC
  Administered 2022-07-29: 100 mg via ORAL
  Filled 2022-07-29: qty 1

## 2022-07-29 MED ORDER — DOXYCYCLINE HYCLATE 100 MG PO CAPS
100.0000 mg | ORAL_CAPSULE | Freq: Two times a day (BID) | ORAL | 0 refills | Status: DC
Start: 2022-07-29 — End: 2022-09-30

## 2022-07-29 NOTE — ED Provider Notes (Signed)
[facility] EMERGENCY DEPARTMENT Provider Note   CSN: [id number] Arrival date & time: 07/28/22  2025     History  Chief Complaint  Patient presents with   Leg Swelling    Leroy Simmons is a 51 y.o. male.  Patient with history of anxiety, depression, diabetes mellitus, chronic diastolic heart failure (EF 55%-60%) presents today with complaints of leg swelling. States that he is on Lasix 40 mg daily for CHF and noticed that he was having an increase in swelling in his bilateral lower extremities. States that when this has happened previously he has doubled his Lasix dose and had improvement, however he has been doing this over the past several days and has not noticed any change. He states that his legs have been hurting more and specifically pain in his left lower leg. States the left leg is red also which is new. He denies any fevers, chills, chest pain, or shortness of breath. He has not been wearing compression stocking or elevating his legs.  The history is provided by the patient. No language interpreter was used.  Icreased in     Home Medications Prior to Admission medications   Medication Sig Start Date End Date Taking? Authorizing Provider  amLODipine (NORVASC) 5 MG tablet Take 1 tablet (5 mg total) by mouth daily. 05/24/22   [place], Blondell Reveal, MD  blood glucose meter kit and supplies Dispense based on patient and insurance preference. Use up to four times daily as directed. (FOR ICD-10 E10.9, E11.9). 05/24/22   [place], Blondell Reveal, MD  busPIRone (BUSPAR) 5 MG tablet TAKE 1 & 1/2 (ONE & ONE-HALF) TABLETS BY MOUTH TWICE DAILY 06/14/22   [place], Blondell Reveal, MD  furosemide (LASIX) 40 MG tablet Take 1 tablet (40 mg total) by mouth daily. 06/06/22   Mariel Aloe, MD  insulin glargine (LANTUS) 100 unit/mL SOPN Inject 26 Units into the skin daily. Patient taking differently: Inject 26 Units into the skin in the morning. 05/24/22   [place], Blondell Reveal, MD  Insulin Pen  Needle (PEN NEEDLES 3/16") 31G X 5 MM MISC 1 each by Does not apply route daily. 01/18/22   [place], Raymond J, MD  isosorbide mononitrate (IMDUR) 30 MG 24 hr tablet Take 0.5 tablets (15 mg total) by mouth daily. Patient not taking: Reported on 06/02/2022 03/29/22   Lenna Sciara, NP  JARDIANCE 10 MG TABS tablet Take 1 tablet (10 mg total) by mouth every morning. 05/24/22   [place], Blondell Reveal, MD  metFORMIN (GLUCOPHAGE-XR) 500 MG 24 hr tablet Take 4 tablets (2,000 mg total) by mouth at bedtime. Patient taking differently: Take 1,000 mg by mouth at bedtime. 05/24/22   [place], Blondell Reveal, MD  metoprolol succinate (TOPROL-XL) 50 MG 24 hr tablet Take 1 tablet (50 mg total) by mouth daily. Take with or immediately following a meal. Patient taking differently: Take 50 mg by mouth daily. 05/24/22   [place], Blondell Reveal, MD  rosuvastatin (CRESTOR) 20 MG tablet Take 1 tablet (20 mg total) by mouth daily. 05/24/22 08/22/22  [place], Raymond J, MD  sertraline (ZOLOFT) 25 MG tablet Take 1 tablet (25 mg total) by mouth daily. Patient not taking: Reported on 06/02/2022 01/18/22   [place], Blondell Reveal, MD      Allergies    Patient has no known allergies.    Review of Systems   Review of Systems  All other systems reviewed and are negative.  Physical Exam Updated Vital Signs BP (!) 148/88 (BP Location: Left Arm)   Pulse 72   Temp (!) 97.5 F (36.4 C) (Oral)   Resp 14   SpO2 100%  Physical Exam Vitals and nursing note reviewed.  Constitutional:      General: He is not in acute distress.    Appearance: Normal appearance. He is normal weight. He is not ill-appearing, toxic-appearing or diaphoretic.  HENT:     Head: Normocephalic and atraumatic.  Cardiovascular:     Rate and Rhythm: Normal rate and regular rhythm.     Heart sounds: Normal heart sounds.  Pulmonary:     Effort: Pulmonary effort is normal. No respiratory distress.     Breath sounds: Normal breath sounds.  Musculoskeletal:        General:  Normal range of motion.     Cervical back: Normal range of motion.  Skin:    General: Skin is warm and dry.     Comments: 2+ pitting edema noted to bilateral lower extremities. DP and PT pulses palpable with doppler. ROM intact. Erythema and warmth noted to the left leg without drainage or purulence, none on the right. Capillary refill less than 2 seconds. See images below for further  Neurological:     General: No focal deficit present.     Mental Status: He is alert.  Psychiatric:        Mood and Affect: Mood normal.        Behavior: Behavior normal.      ED Results / Procedures / Treatments   Labs (all labs ordered are listed, but only abnormal results are displayed) Labs Reviewed  COMPREHENSIVE METABOLIC PANEL - Abnormal; Notable for the following components:      Result Value   Glucose, Bld 151 (*)    BUN 29 (*)    Creatinine, Ser 1.34 (*)    Albumin 3.2 (*)    AST 14 (*)    All other components within normal limits  CBC  BRAIN NATRIURETIC PEPTIDE    EKG None  Radiology VAS [place] LOWER EXTREMITY VENOUS (DVT) (7a-7p)  Result Date: 07/29/2022  Lower Venous DVT Study Patient Name:  Leroy Simmons  Date of Exam:   07/29/2022 Medical Rec #: [id number]       Accession #:    [id number] Date of Birth: Jul 14, 1971       Patient Gender: M Patient Age:   24 years Exam Location:  [facility] Procedure:      VAS [place] LOWER EXTREMITY VENOUS (DVT) Referring Phys: Ova Freshwater FONDAW --------------------------------------------------------------------------------  Indications: Swelling, and Edema.  Comparison Study: 03/18/22 prior Performing Technologist: Archie Patten RVS  Examination Guidelines: A complete evaluation includes B-mode imaging, spectral Doppler, color Doppler, and power Doppler as needed of all accessible portions of each vessel. Bilateral testing is considered an integral part of a complete examination. Limited examinations for reoccurring indications may be performed as noted.  The reflux portion of the exam is performed with the patient in reverse Trendelenburg.  +-----+---------------+---------+-----------+----------+--------------+ RIGHTCompressibilityPhasicitySpontaneityPropertiesThrombus Aging +-----+---------------+---------+-----------+----------+--------------+ CFV  Full           Yes      Yes                                 +-----+---------------+---------+-----------+----------+--------------+   +--------+---------------+---------+-----------+----------+--------------------+ LEFT    CompressibilityPhasicitySpontaneityPropertiesThrombus Aging       +--------+---------------+---------+-----------+----------+--------------------+ CFV     Full  Yes      Yes                                       +--------+---------------+---------+-----------+----------+--------------------+ SFJ     Full                                                              +--------+---------------+---------+-----------+----------+--------------------+ FV Prox Full                                                              +--------+---------------+---------+-----------+----------+--------------------+ FV Mid  Full                                                              +--------+---------------+---------+-----------+----------+--------------------+ FV      Full                                                              Distal                                                                    +--------+---------------+---------+-----------+----------+--------------------+ PFV     Full                                                              +--------+---------------+---------+-----------+----------+--------------------+ POP     Full           Yes      Yes                                       +--------+---------------+---------+-----------+----------+--------------------+ PTV     Full           Yes      Yes                   patent by color  doppler              +--------+---------------+---------+-----------+----------+--------------------+ PERO    Full           Yes      Yes                  patent by color                                                           doppler              +--------+---------------+---------+-----------+----------+--------------------+    Summary: RIGHT: - No evidence of common femoral vein obstruction.  LEFT: - There is no evidence of deep vein thrombosis in the lower extremity.  - No cystic structure found in the popliteal fossa.  *See table(s) above for measurements and observations.    Preliminary    DG Chest 1 View  Result Date: 07/29/2022 CLINICAL DATA:  Bilateral leg swelling.  History of CHF. EXAM: CHEST  1 VIEW COMPARISON:  One view chest x-ray scratched at two-view chest x-ray 06/01/2022 FINDINGS: Heart is mildly enlarged. Lung volumes are low. Mild pulmonary vascular congestion is present without frank edema. No focal airspace disease present. No effusions are present. IMPRESSION: Mild cardiomegaly and pulmonary vascular congestion without frank edema. Electronically Signed   By: San Morelle M.D.   On: 07/29/2022 12:56    Procedures Procedures    Medications Ordered in ED Medications  furosemide (LASIX) injection 40 mg (has no administration in time range)  doxycycline (VIBRA-TABS) tablet 100 mg (has no administration in time range)    ED Course/ Medical Decision Making/ A&P                           Medical Decision Making Risk Prescription drug management.   This patient is a 51 y.o. male  who presents to the ED for concern of bilateral lower leg swelling, left lower leg redness.   Differential diagnoses prior to evaluation: The emergent differential diagnosis includes, but is not limited to,  CHF exacerbation, fluid overload, DVT.   This is not an exhaustive  differential.   Past Medical History / Co-morbidities: Hx CHF, diabetes, hypertension  Additional history: Chart reviewed. Pertinent results include: most recent echo in 6/23 showed EF 55%-60%. Patient takes 40 mg Lasix daily  Physical Exam: Physical exam performed. The pertinent findings include: bilateral lower extremity 2+ pitting edema with cellulitic changes in the left, no purulence. No skin changes on the right.  Lab Tests/Imaging studies: I personally interpreted labs/imaging and the pertinent results include:  no leukocytosis, glucose 151, BUN 29, creatinine 1.34, unchanged from previous.  BNP normal.  Chest x-ray shows mild cardiomegaly and pulmonary vascular congestion without frank edema. Bilateral DVT study negative. I agree with the radiologist interpretation.    Medications: I ordered medication including Lasix for fluid overload, doxycycline for cellulitis.  I have reviewed the patients home medicines and have made adjustments as needed.   Disposition: After consideration of the diagnostic results and the patients response to treatment, I feel that patient is stable for discharge. Patients emergency department workup does not suggest an emergent condition requiring admission or immediate intervention beyond what has been performed at this time. The plan is: discharge with  80 mg Lasix daily and doxycycline for left leg cellulitis. Patient is afebrile, nontoxic-appearing, and in no acute distress with reassuring vital signs.  Work-up reassuring, no concern for CHF exacerbation or DVT.  He does have some redness of his left lower leg concerning for cellulitis, started on doxycycline for same.  No leukocytosis.  No concern for sepsis.  Patient is understanding and amenable with plan, emphasized importance of calling his cardiologist Monday to further discuss his diuretic regimen.  Also emphasized the importance of elevating his legs regularly and wearing compression stockings to help  reduce swelling.  The patient is safe for discharge and has been instructed to return immediately for worsening symptoms, change in symptoms or any other concerns.  Patient discharged in stable condition.   This is a shared visit with supervising physician Dr. Langston Masker who has independently evaluated patient & provided guidance in evaluation/management/disposition, in agreement with care    Final Clinical Impression(s) / ED Diagnoses Final diagnoses:  Bilateral lower extremity edema  Left leg cellulitis    Rx / DC Orders ED Discharge Orders          Ordered    doxycycline (VIBRAMYCIN) 100 MG capsule  2 times daily        07/29/22 1830          An After Visit Summary was printed and given to the patient.     Nestor Lewandowsky 07/29/22 1836    Wyvonnia Dusky, MD 07/29/22 2020

## 2022-07-29 NOTE — Discharge Instructions (Addendum)
As we discussed, your work-up in the ER today was reassuring for acute findings.  Prescription for an antibiotic for you to take as prescribed in its entirety for management of infection.  I also recommend that while you are having the increase in swelling in your legs, continue to take 80 mg of Lasix daily.  I also recommend that you wear compression stockings and elevate your legs as you are able to help reduce swelling.  Follow-up with your primary care doctor for your additional health needs.  Return if development of any new or worsening symptoms.

## 2022-07-29 NOTE — ED Notes (Signed)
Pt. Came up to this RN and thought he missed his name being called.  Pt. Did and was moved .  Pt. Placed back into waiting room.  Updated pt.

## 2022-07-29 NOTE — ED Triage Notes (Signed)
Patient complains of left leg swelling that started three or four days ago. Patient states this has happened multiple times in the past and usually resolved when he takes an increased Lasix dose but states that has not helped this time. Patient denies shortness of breath, reports mild pain in left leg with walking.

## 2022-07-29 NOTE — ED Notes (Signed)
PT called X3 for triage no response 

## 2022-07-29 NOTE — ED Provider Triage Note (Signed)
Emergency Medicine Provider Triage Evaluation Note  Leroy Simmons , a 51 y.o. male  was evaluated in triage.  Pt complains of left leg pain and swelling for approximately 4 days.  He states that this is happened in the past and usually is able to double up on his Lasix prescription however he states that he has doubled up on Lasix for 2 out of the past 3 days which has not severely improved his leg swelling.  He states that his foot feels hot and uncomfortable.  On review of EMR it seems that he has had cellulitis in the past, no history of VTE  Review of Systems  Positive: Left leg pain Negative: Fever  Physical Exam  BP (!) 148/88 (BP Location: Left Arm)   Pulse 72   Temp (!) 97.5 F (36.4 C) (Oral)   Resp 14   SpO2 100%  Gen:   Awake, no distress   Resp:  Normal effort  MSK:   Moves extremities without difficulty  Other:  Examination is limited due to body habitus and patient wearing jeans however shoe was removed and left leg is swollen.  Bilateral legs with at least 2+ pitting edema to the knees.  Lungs clear without crackles.  Medical Decision Making  Medically screening exam initiated at 12:36 PM.  Appropriate orders placed.  ASTOR GENTLE was informed that the remainder of the evaluation will be completed by another provider, this initial triage assessment does not replace that evaluation, and the importance of remaining in the ED until their evaluation is complete.  DVT study, 1 view chest x-ray, labs, BNP   Tedd Sias, [place] 07/29/22 1237

## 2022-07-29 NOTE — Progress Notes (Signed)
Lower extremity venous has been completed.   Preliminary results in CV Proc.   Peggie Hornak Autie Vasudevan 07/29/2022 2:18 PM

## 2022-09-25 ENCOUNTER — Inpatient Hospital Stay: Payer: Self-pay

## 2022-09-25 ENCOUNTER — Inpatient Hospital Stay
Admission: EM | Admit: 2022-09-25 | Discharge: 2022-09-30 | DRG: 398 | Disposition: A | Discharge status: Home / Self Care | Payer: Self-pay | Attending: Surgery | Admitting: Surgery

## 2022-09-25 ENCOUNTER — Emergency Department: Payer: Self-pay

## 2022-09-25 ENCOUNTER — Other Ambulatory Visit: Payer: Self-pay

## 2022-09-25 DIAGNOSIS — Z8249 Family history of ischemic heart disease and other diseases of the circulatory system: Secondary | ICD-10-CM

## 2022-09-25 DIAGNOSIS — I5032 Chronic diastolic (congestive) heart failure: Secondary | ICD-10-CM | POA: Diagnosis present

## 2022-09-25 DIAGNOSIS — N182 Chronic kidney disease, stage 2 (mild): Secondary | ICD-10-CM | POA: Diagnosis present

## 2022-09-25 DIAGNOSIS — I2510 Atherosclerotic heart disease of native coronary artery without angina pectoris: Secondary | ICD-10-CM | POA: Diagnosis present

## 2022-09-25 DIAGNOSIS — I130 Hypertensive heart and chronic kidney disease with heart failure and stage 1 through stage 4 chronic kidney disease, or unspecified chronic kidney disease: Secondary | ICD-10-CM | POA: Diagnosis present

## 2022-09-25 DIAGNOSIS — E1165 Type 2 diabetes mellitus with hyperglycemia: Secondary | ICD-10-CM

## 2022-09-25 DIAGNOSIS — E1169 Type 2 diabetes mellitus with other specified complication: Secondary | ICD-10-CM

## 2022-09-25 DIAGNOSIS — N189 Chronic kidney disease, unspecified: Secondary | ICD-10-CM | POA: Diagnosis present

## 2022-09-25 DIAGNOSIS — E785 Hyperlipidemia, unspecified: Secondary | ICD-10-CM | POA: Diagnosis present

## 2022-09-25 DIAGNOSIS — K8020 Calculus of gallbladder without cholecystitis without obstruction: Secondary | ICD-10-CM | POA: Diagnosis present

## 2022-09-25 DIAGNOSIS — N179 Acute kidney failure, unspecified: Secondary | ICD-10-CM | POA: Diagnosis present

## 2022-09-25 DIAGNOSIS — E876 Hypokalemia: Secondary | ICD-10-CM | POA: Diagnosis present

## 2022-09-25 DIAGNOSIS — F32A Depression, unspecified: Secondary | ICD-10-CM | POA: Diagnosis present

## 2022-09-25 DIAGNOSIS — K3580 Unspecified acute appendicitis: Secondary | ICD-10-CM | POA: Diagnosis present

## 2022-09-25 DIAGNOSIS — K3533 Acute appendicitis with perforation and localized peritonitis, with abscess: Principal | ICD-10-CM | POA: Diagnosis present

## 2022-09-25 DIAGNOSIS — J45909 Unspecified asthma, uncomplicated: Secondary | ICD-10-CM | POA: Diagnosis present

## 2022-09-25 DIAGNOSIS — R911 Solitary pulmonary nodule: Secondary | ICD-10-CM | POA: Diagnosis present

## 2022-09-25 DIAGNOSIS — I160 Hypertensive urgency: Secondary | ICD-10-CM

## 2022-09-25 DIAGNOSIS — Z6837 Body mass index (BMI) 37.0-37.9, adult: Secondary | ICD-10-CM

## 2022-09-25 DIAGNOSIS — Z794 Long term (current) use of insulin: Secondary | ICD-10-CM

## 2022-09-25 DIAGNOSIS — I5033 Acute on chronic diastolic (congestive) heart failure: Secondary | ICD-10-CM | POA: Diagnosis present

## 2022-09-25 DIAGNOSIS — E669 Obesity, unspecified: Secondary | ICD-10-CM | POA: Insufficient documentation

## 2022-09-25 DIAGNOSIS — N400 Enlarged prostate without lower urinary tract symptoms: Secondary | ICD-10-CM | POA: Diagnosis present

## 2022-09-25 DIAGNOSIS — K3531 Acute appendicitis with localized peritonitis and gangrene, without perforation: Principal | ICD-10-CM

## 2022-09-25 DIAGNOSIS — E1122 Type 2 diabetes mellitus with diabetic chronic kidney disease: Secondary | ICD-10-CM | POA: Diagnosis present

## 2022-09-25 DIAGNOSIS — Z79899 Other long term (current) drug therapy: Secondary | ICD-10-CM

## 2022-09-25 DIAGNOSIS — Z01810 Encounter for preprocedural cardiovascular examination: Secondary | ICD-10-CM

## 2022-09-25 DIAGNOSIS — K5730 Diverticulosis of large intestine without perforation or abscess without bleeding: Secondary | ICD-10-CM | POA: Diagnosis present

## 2022-09-25 DIAGNOSIS — I5030 Unspecified diastolic (congestive) heart failure: Secondary | ICD-10-CM

## 2022-09-25 DIAGNOSIS — F419 Anxiety disorder, unspecified: Secondary | ICD-10-CM | POA: Diagnosis present

## 2022-09-25 DIAGNOSIS — Z7984 Long term (current) use of oral hypoglycemic drugs: Secondary | ICD-10-CM

## 2022-09-25 DIAGNOSIS — N200 Calculus of kidney: Secondary | ICD-10-CM | POA: Diagnosis present

## 2022-09-25 HISTORY — DX: Unspecified acute appendicitis: K35.80

## 2022-09-25 LAB — COMPREHENSIVE METABOLIC PANEL
ALT: 27 U/L (ref 0–44)
AST: 17 U/L (ref 15–41)
Albumin: 2.4 g/dL — ABNORMAL LOW (ref 3.5–5.0)
Alkaline Phosphatase: 155 U/L — ABNORMAL HIGH (ref 38–126)
Anion gap: 9 (ref 5–15)
BUN: 26 mg/dL — ABNORMAL HIGH (ref 6–20)
CO2: 22 mmol/L (ref 22–32)
Calcium: 8.4 mg/dL — ABNORMAL LOW (ref 8.9–10.3)
Chloride: 103 mmol/L (ref 98–111)
Creatinine, Ser: 1.37 mg/dL — ABNORMAL HIGH (ref 0.61–1.24)
GFR, Estimated: >60 mL/min (ref >60)
Glucose, Bld: 236 mg/dL — ABNORMAL HIGH (ref 70–99)
Potassium: 3.2 mmol/L — ABNORMAL LOW (ref 3.5–5.1)
Sodium: 134 mmol/L — ABNORMAL LOW (ref 135–145)
Total Bilirubin: 1.6 mg/dL — ABNORMAL HIGH (ref 0.3–1.2)
Total Protein: 6.5 g/dL (ref 6.5–8.1)

## 2022-09-25 LAB — CBC
HCT: 36.5 % — ABNORMAL LOW (ref 39.0–52.0)
Hemoglobin: 11.9 g/dL — ABNORMAL LOW (ref 13.0–17.0)
MCH: 28.6 pg (ref 26.0–34.0)
MCHC: 32.6 g/dL (ref 30.0–36.0)
MCV: 87.7 fL (ref 80.0–100.0)
Platelets: 234 10*3/uL (ref 150–400)
RBC: 4.16 MIL/uL — ABNORMAL LOW (ref 4.22–5.81)
RDW: 12.4 % (ref 11.5–15.5)
WBC: 14.1 10*3/uL — ABNORMAL HIGH (ref 4.0–10.5)
nRBC: 0 % (ref 0.0–0.2)

## 2022-09-25 LAB — URINALYSIS, ROUTINE W REFLEX MICROSCOPIC
Bilirubin Urine: NEGATIVE
Glucose, UA: >=500 mg/dL — AB
Hgb urine dipstick: NEGATIVE
Ketones, ur: 5 mg/dL — AB
Leukocytes,Ua: NEGATIVE
Nitrite: NEGATIVE
Protein, ur: >=300 mg/dL — AB
Specific Gravity, Urine: 1.025 (ref 1.005–1.030)
pH: 5 (ref 5.0–8.0)

## 2022-09-25 LAB — CBG MONITORING, ED: Glucose-Capillary: 160 mg/dL — ABNORMAL HIGH (ref 70–99)

## 2022-09-25 LAB — LIPASE, BLOOD: Lipase: 39 U/L (ref 11–51)

## 2022-09-25 MED ORDER — HYDROMORPHONE HCL 1 MG/ML IJ SOLN
1.0000 mg | INTRAMUSCULAR | Status: DC | PRN
  Administered 2022-09-26: 1 mg via INTRAVENOUS
  Filled 2022-09-25: qty 1

## 2022-09-25 MED ORDER — KETOROLAC TROMETHAMINE 30 MG/ML IJ SOLN: 30.0000 mg | Freq: Four times a day (QID) | INTRAMUSCULAR | Status: DC | PRN

## 2022-09-25 MED ORDER — FUROSEMIDE 10 MG/ML IJ SOLN
40.0000 mg | Freq: Two times a day (BID) | INTRAMUSCULAR | Status: DC
  Administered 2022-09-25 – 2022-09-26 (×3): 40 mg via INTRAVENOUS
  Filled 2022-09-25 (×3): qty 4

## 2022-09-25 MED ORDER — ONDANSETRON 4 MG PO TBDP: 4.0000 mg | ORAL_TABLET | Freq: Four times a day (QID) | ORAL | Status: DC | PRN

## 2022-09-25 MED ORDER — SODIUM CHLORIDE 0.9 % IV SOLN: INTRAVENOUS | Status: DC

## 2022-09-25 MED ORDER — SODIUM CHLORIDE 0.9 % IV SOLN
1.0000 g | Freq: Once | INTRAVENOUS | Status: AC
  Administered 2022-09-25: 1 g via INTRAVENOUS
  Filled 2022-09-25: qty 10

## 2022-09-25 MED ORDER — KETOROLAC TROMETHAMINE 30 MG/ML IJ SOLN
30.0000 mg | Freq: Four times a day (QID) | INTRAMUSCULAR | Status: DC
  Administered 2022-09-26 (×2): 30 mg via INTRAVENOUS
  Filled 2022-09-25 (×2): qty 1

## 2022-09-25 MED ORDER — MORPHINE SULFATE (PF) 4 MG/ML IV SOLN
4.0000 mg | Freq: Once | INTRAVENOUS | Status: AC
  Administered 2022-09-25: 4 mg via INTRAVENOUS
  Filled 2022-09-25: qty 1

## 2022-09-25 MED ORDER — PIPERACILLIN-TAZOBACTAM 3.375 G IVPB
3.3750 g | Freq: Three times a day (TID) | INTRAVENOUS | Status: DC
  Administered 2022-09-25 – 2022-09-26 (×2): 3.375 g via INTRAVENOUS
  Filled 2022-09-25 (×2): qty 50

## 2022-09-25 MED ORDER — ONDANSETRON HCL 4 MG/2ML IJ SOLN
4.0000 mg | Freq: Four times a day (QID) | INTRAMUSCULAR | Status: DC | PRN
  Administered 2022-09-26: 4 mg via INTRAVENOUS
  Filled 2022-09-25: qty 2

## 2022-09-25 MED ORDER — PIPERACILLIN-TAZOBACTAM 3.375 G IVPB 30 MIN: 3.3750 g | Freq: Once | INTRAVENOUS | Status: DC

## 2022-09-25 MED ORDER — OXYCODONE-ACETAMINOPHEN 5-325 MG PO TABS: 1.0000 | ORAL_TABLET | ORAL | Status: DC | PRN

## 2022-09-25 MED ORDER — POTASSIUM CHLORIDE CRYS ER 10 MEQ PO TBCR
10.0000 meq | EXTENDED_RELEASE_TABLET | Freq: Two times a day (BID) | ORAL | Status: DC
  Administered 2022-09-26 – 2022-09-27 (×4): 10 meq via ORAL
  Filled 2022-09-25 (×4): qty 1

## 2022-09-25 MED ORDER — IOHEXOL 350 MG/ML SOLN
100.0000 mL | Freq: Once | INTRAVENOUS | Status: AC | PRN
  Administered 2022-09-25: 100 mL via INTRAVENOUS

## 2022-09-25 MED ORDER — HYDRALAZINE HCL 20 MG/ML IJ SOLN: 10.0000 mg | INTRAMUSCULAR | Status: DC | PRN

## 2022-09-25 MED ORDER — POTASSIUM CHLORIDE 10 MEQ/100ML IV SOLN
10.0000 meq | INTRAVENOUS | Status: AC
  Administered 2022-09-26: 10 meq via INTRAVENOUS
  Filled 2022-09-25: qty 100

## 2022-09-25 MED ORDER — SODIUM CHLORIDE 0.9 % IV BOLUS
1000.0000 mL | Freq: Once | INTRAVENOUS | Status: AC
  Administered 2022-09-25: 1000 mL via INTRAVENOUS

## 2022-09-25 MED ORDER — ONDANSETRON HCL 4 MG/2ML IJ SOLN
4.0000 mg | Freq: Once | INTRAMUSCULAR | Status: AC
  Administered 2022-09-25: 4 mg via INTRAVENOUS
  Filled 2022-09-25: qty 2

## 2022-09-25 MED ORDER — PANTOPRAZOLE SODIUM 40 MG IV SOLR
40.0000 mg | Freq: Every day | INTRAVENOUS | Status: DC
  Administered 2022-09-26: 40 mg via INTRAVENOUS
  Filled 2022-09-25: qty 10

## 2022-09-25 MED ORDER — INSULIN ASPART 100 UNIT/ML IJ SOLN
0.0000 [IU] | INTRAMUSCULAR | Status: DC
  Administered 2022-09-25: 4 [IU] via SUBCUTANEOUS
  Administered 2022-09-26: 7 [IU] via SUBCUTANEOUS
  Administered 2022-09-26: 15 [IU] via SUBCUTANEOUS
  Administered 2022-09-26 (×2): 4 [IU] via SUBCUTANEOUS
  Administered 2022-09-27: 7 [IU] via SUBCUTANEOUS
  Administered 2022-09-27 (×2): 11 [IU] via SUBCUTANEOUS
  Administered 2022-09-27: 4 [IU] via SUBCUTANEOUS
  Administered 2022-09-27: 7 [IU] via SUBCUTANEOUS
  Administered 2022-09-28 – 2022-09-29 (×7): 4 [IU] via SUBCUTANEOUS
  Administered 2022-09-29: 7 [IU] via SUBCUTANEOUS
  Administered 2022-09-29: 4 [IU] via SUBCUTANEOUS
  Administered 2022-09-29: 3 [IU] via SUBCUTANEOUS
  Administered 2022-09-29 – 2022-09-30 (×2): 4 [IU] via SUBCUTANEOUS
  Administered 2022-09-30: 3 [IU] via SUBCUTANEOUS
  Filled 2022-09-25 (×20): qty 1

## 2022-09-25 NOTE — Assessment & Plan Note (Addendum)
Continue outpatient surveillance. °

## 2022-09-25 NOTE — Assessment & Plan Note (Addendum)
Blood pressure now within goal. Blood pressure initially elevated secondary to pain.  Continue Toprol and Imdur.

## 2022-09-25 NOTE — H&P (Signed)
Patient ID: Leroy Simmons, male   DOB: Jul 03, 1971, 51 y.o.   MRN: [id number]  Chief Complaint: Abdominal pain  History of Present Illness Leroy Simmons is a 51 y.o. male with approximately 3-day history of abdominal pain with associated weakness, easy fatigue, nausea.  Drinking fluids to attempt to maintain hydration, denying vomiting or diarrhea.  Denies any fevers or chills but a general malaise.  Presented to the ED with progressive right lower quadrant pain which is exacerbated with any kind of movement.  Past Medical History Past Medical History:  Diagnosis Date   Anxiety    CHF (congestive heart failure) ([place])    Depression    Diabetes mellitus without complication ([place])    Hypertension       Past Surgical History:  Procedure Laterality Date   LEFT HEART CATH AND CORONARY ANGIOGRAPHY N/A 03/21/2022   Procedure: LEFT HEART CATH AND CORONARY ANGIOGRAPHY;  Surgeon: Jettie Booze, MD;  Location: [facility];  Service: Cardiovascular;  Laterality: N/A;    No Known Allergies  Current Facility-Administered Medications  Medication Dose Route Frequency Provider Last Rate Last Admin   0.9 %  sodium chloride infusion   Intravenous Continuous Ronny Bacon, MD       hydrALAZINE (APRESOLINE) injection 10 mg  10 mg Intravenous Q2H PRN Ronny Bacon, MD       HYDROmorphone (DILAUDID) injection 1 mg  1 mg Intravenous Q3H PRN Ronny Bacon, MD       Derrill Memo ON 09/26/2022] ketorolac (TORADOL) 30 MG/ML injection 30 mg  30 mg Intravenous Q6H Ronny Bacon, MD       Followed by   Derrill Memo ON 10/01/2022] ketorolac (TORADOL) 30 MG/ML injection 30 mg  30 mg Intravenous Q6H PRN Ronny Bacon, MD       ondansetron (ZOFRAN-ODT) disintegrating tablet 4 mg  4 mg Oral Q6H PRN Ronny Bacon, MD       Or   ondansetron [facility]) injection 4 mg  4 mg Intravenous Q6H PRN Ronny Bacon, MD       oxyCODONE-acetaminophen (PERCOCET/ROXICET) 5-325 MG per tablet 1-2 tablet  1-2 tablet  Oral Q4H PRN Ronny Bacon, MD       pantoprazole (PROTONIX) injection 40 mg  40 mg Intravenous QHS Ronny Bacon, MD       piperacillin-tazobactam (ZOSYN) IVPB 3.375 g  3.375 g Intravenous Once Harvest Dark, MD       piperacillin-tazobactam (ZOSYN) IVPB 3.375 g  3.375 g Intravenous Q8H Ronny Bacon, MD       Current Outpatient Medications  Medication Sig Dispense Refill   amLODipine (NORVASC) 5 MG tablet Take 1 tablet (5 mg total) by mouth daily. 90 tablet 1   blood glucose meter kit and supplies Dispense based on patient and insurance preference. Use up to four times daily as directed. (FOR ICD-10 E10.9, E11.9). 1 each 0   busPIRone (BUSPAR) 5 MG tablet TAKE 1 & 1/2 (ONE & ONE-HALF) TABLETS BY MOUTH TWICE DAILY 90 tablet 0   doxycycline (VIBRAMYCIN) 100 MG capsule Take 1 capsule (100 mg total) by mouth 2 (two) times daily. 20 capsule 0   furosemide (LASIX) 40 MG tablet Take 1 tablet (40 mg total) by mouth daily.     insulin glargine (LANTUS) 100 unit/mL SOPN Inject 26 Units into the skin daily. (Patient taking differently: Inject 26 Units into the skin in the morning.) 15 mL 1   Insulin Pen Needle (PEN NEEDLES 3/16") 31G X 5 MM MISC 1 each by Does  not apply route daily. 90 each 1   isosorbide mononitrate (IMDUR) 30 MG 24 hr tablet Take 0.5 tablets (15 mg total) by mouth daily. (Patient not taking: Reported on 06/02/2022) 30 tablet 3   JARDIANCE 10 MG TABS tablet Take 1 tablet (10 mg total) by mouth every morning. 90 tablet 1   metFORMIN (GLUCOPHAGE-XR) 500 MG 24 hr tablet Take 4 tablets (2,000 mg total) by mouth at bedtime. (Patient taking differently: Take 1,000 mg by mouth at bedtime.) 360 tablet 1   metoprolol succinate (TOPROL-XL) 50 MG 24 hr tablet Take 1 tablet (50 mg total) by mouth daily. Take with or immediately following a meal. (Patient taking differently: Take 50 mg by mouth daily.) 30 tablet 0   rosuvastatin (CRESTOR) 20 MG tablet Take 1 tablet (20 mg total) by mouth  daily. 90 tablet 0   sertraline (ZOLOFT) 25 MG tablet Take 1 tablet (25 mg total) by mouth daily. (Patient not taking: Reported on 06/02/2022) 30 tablet 1    Family History Family History  Problem Relation Age of Onset   Heart failure Mother       Social History Social History   Tobacco Use   Smoking status: Never   Smokeless tobacco: Never  Vaping Use   Vaping Use: Never used  Substance Use Topics   Alcohol use: Yes    Comment: rarely   Drug use: Never        Review of Systems  All other systems reviewed and are negative.    Physical Exam Blood pressure (S) (!) 181/105, pulse (!) 101, temperature 98.9 F (37.2 C), temperature source Oral, resp. rate 20, height _0  (1.702 m), weight 99.8 kg, SpO2 99 %. Last Weight  Most recent update: 09/25/2022  2:37 PM    Weight  99.8 kg (220 lb)             CONSTITUTIONAL: Well developed, and nourished, appropriately responsive and aware without distress.   EYES: Sclera non-icteric.   EARS, NOSE, MOUTH AND THROAT:  The oropharynx is clear. Oral mucosa is pink and moist.   Hearing is intact to voice.  NECK: Trachea is midline, and there is no jugular venous distension.  LYMPH NODES:  Lymph nodes in the neck are not appreciated. RESPIRATORY:  Lungs are clear, and breath sounds are equal bilaterally. Normal respiratory effort without pathologic use of accessory muscles. CARDIOVASCULAR: Heart is regular in rate and rhythm.  Well perfused.  GI: The abdomen is tender in the right lower quadrant with guarding.  Otherwise soft, nontender, and nondistended. There were no palpable masses. I did not appreciate hepatosplenomegaly. There were normal bowel sounds. MUSCULOSKELETAL:  Symmetrical muscle tone appreciated in all four extremities.    SKIN: Skin turgor is normal. No pathologic skin lesions appreciated.  NEUROLOGIC:  Motor and sensation appear grossly normal.  Cranial nerves are grossly without defect. PSYCH:  Alert and oriented  to person, place and time. Affect is appropriate for situation.  Data Reviewed I have personally reviewed what is currently available of the patient's imaging, recent labs and medical records.   Labs:     Latest Ref Rng & Units 09/25/2022    2:39 PM 07/29/2022   12:40 PM 06/03/2022    9:32 AM  CBC  WBC 4.0 - 10.5 K/uL 14.1  7.7  7.4   Hemoglobin 13.0 - 17.0 g/dL 11.9  13.0  14.4   Hematocrit 39.0 - 52.0 % 36.5  40.4  40.5   Platelets 150 -  400 K/uL 234  315  230       Latest Ref Rng & Units 09/25/2022    2:39 PM 07/29/2022   12:40 PM 06/03/2022    9:32 AM  CMP  Glucose 70 - 99 mg/dL 236  151  140   BUN 6 - 20 mg/dL _0 Creatinine 0.61 - 1.24 mg/dL 1.37  1.34  1.32   Sodium 135 - 145 mmol/L 134  140  139   Potassium 3.5 - 5.1 mmol/L 3.2  3.8  4.3   Chloride 98 - 111 mmol/L 103  107  106   CO2 22 - 32 mmol/L _1 Calcium 8.9 - 10.3 mg/dL 8.4  9.0  9.0   Total Protein 6.5 - 8.1 g/dL 6.5  6.9    Total Bilirubin 0.3 - 1.2 mg/dL 1.6  0.7    Alkaline Phos 38 - 126 U/L 155  86    AST 15 - 41 U/L 17  14    ALT 0 - 44 U/L 27  18        Imaging: Radiological images reviewed:   Within last 24 hrs: CT ABDOMEN PELVIS W CONTRAST  Result Date: 09/25/2022 CLINICAL DATA:  Right lower quadrant abdominal pain EXAM: CT ABDOMEN AND PELVIS WITH CONTRAST TECHNIQUE: Multidetector CT imaging of the abdomen and pelvis was performed using the standard protocol following bolus administration of intravenous contrast. RADIATION DOSE REDUCTION: This exam was performed according to the departmental dose-optimization program which includes automated exposure control, adjustment of the mA and/or kV according to patient size and/or use of iterative reconstruction technique. CONTRAST:  110m OMNIPAQUE IOHEXOL 350 MG/ML SOLN COMPARISON:  06/02/2022 FINDINGS: Lower chest: 9 mm subpleural pulmonary nodule within the left lower lobe is stable when accounting for changes in slice selection since  remote prior examination of 03/18/2022. Trace interstitial pulmonary edema. Small right pleural effusion has developed. Cardiac size is at the upper limits of normal. Moderate coronary artery calcification noted. Hepatobiliary: Cholelithiasis without pericholecystic inflammatory change. No intra or extrahepatic biliary ductal dilation. Liver unremarkable. Pancreas: Unremarkable Spleen: Unremarkable Adrenals/Urinary Tract: The adrenal glands are unremarkable. The kidneys are normal in size and position. 2-3 mm nonobstructing calculus is seen within the left renal pelvis. No additional renal or ureteral calculi. Simple cortical cyst noted within the lower pole of the right kidney. No follow-up imaging is recommended for this lesion. The kidneys are otherwise unremarkable. The bladder is unremarkable. Stomach/Bowel: There is extensive periappendiceal inflammatory stranding and mural thickening involving the appendix. Additionally, there are areas of hypoenhancement involving the mid body of the appendix best seen on coronal image # 43/5 and axial image # 83/2. Together the findings suggest changes of acute gangrenous appendicitis. No evidence of perforation. No extraluminal gas or periappendiceal fluid collections are identified. There is trace free intraperitoneal fluid within the pelvis. Edema tracks within the retroperitoneum into the pelvis bilaterally. The stomach, small bowel, and large bowel are otherwise unremarkable save for mild sigmoid diverticulosis. No free intraperitoneal gas. Vascular/Lymphatic: Aortic atherosclerosis. No enlarged abdominal or pelvic lymph nodes. Reproductive: Mild prostatic hypertrophy. Other: No abdominal wall hernia. Musculoskeletal: No acute bone abnormality. No lytic or blastic bone lesion. IMPRESSION: 1. Acute gangrenous appendicitis. No evidence of perforation. 2. Moderate coronary artery calcification. Interval development of trace interstitial pulmonary edema and small right  pleural effusion possibly reflecting changes of mild cardiogenic failure. 3. Cholelithiasis. 4. Minimal left nonobstructing nephrolithiasis. 5. Mild sigmoid  diverticulosis. 6. Mild prostatic hypertrophy. 7. Stable 9 mm subpleural pulmonary nodule within the left lower lobe since remote prior examination of 03/18/2022. Non-contrast chest CT at 12-18 months (from today's scan) is considered optional for low-risk patients, but is recommended for high-risk patients. This recommendation follows the consensus statement: Guidelines for Management of Incidental Pulmonary Nodules Detected on CT Images: From the Fleischner Society 2017; Radiology 2017; 284:228-243. Aortic Atherosclerosis (ICD10-I70.0). Electronically Signed   By: Fidela Salisbury M.D.   On: 09/25/2022 20:28    Assessment     Patient Active Problem List   Diagnosis Date Noted   Acute appendicitis 09/25/2022   Chronic diastolic CHF (congestive heart failure) ([place]) 06/02/2022   Pulmonary nodule 06/02/2022   AKI (acute kidney injury) ([place]) 03/18/2022   Coronary atherosclerosis of native coronary artery 03/18/2022   Acute exacerbation of CHF (congestive heart failure) ([place]) 03/18/2022   Cellulitis of right lower extremity    CHF exacerbation ([place]) 03/17/2022   Anxiety and depression 03/10/2022   Cardiomegaly 01/18/2022   Diabetes mellitus ([place]) 12/07/2021   Hyperlipidemia 12/07/2021   Pain of both shoulder joints 10/22/2021   Cellulitis of back except buttock 10/01/2020   Asthma 10/26/2019   Cellulitis of left heel 10/26/2019   Chest pain 10/26/2019   Left foot pain 10/26/2019   Cellulitis and abscess of neck 07/13/2019   Abscess of back, except buttock 05/30/2019   HYPOKALEMIA 09/07/2009   MORBID OBESITY 09/07/2009   Anxiety 09/07/2009   Depression 09/07/2009   Essential hypertension 09/07/2009   ASTHMA 09/07/2009   DIARRHEA, CHRONIC 09/07/2009    Plan    The risks, benefits, complications, treatment options, and expected  outcomes were discussed with the patient. The treatment of antibiotics alone was discussed giving a 20% chance that this could fail and surgery would be necessary.  Also discussed continuing to the operating room for Laparoscopic Appendectomy.  The possibilities of  bleeding, recurrent infection, perforation of viscus, finding a normal appendix, the need for additional procedures, failure to diagnose a condition, conversion to open procedure and creating a complication requiring transfusion or further operations were discussed. The patient was given the opportunity to ask questions and have them answered.  Patient would like to proceed with Laparoscopic Appendectomy and consent was obtained.     Latest Ref Rng & Units 09/25/2022    2:39 PM 07/29/2022   12:40 PM 06/03/2022    9:32 AM  CBC  WBC 4.0 - 10.5 K/uL 14.1  7.7  7.4   Hemoglobin 13.0 - 17.0 g/dL 11.9  13.0  14.4   Hematocrit 39.0 - 52.0 % 36.5  40.4  40.5   Platelets 150 - 400 K/uL 234  315  230      Face-to-face time spent with the patient and accompanying care providers(if present) was 30 minutes, with more than 50% of the time spent counseling, educating, and coordinating care of the patient.    These notes generated with voice recognition software. I apologize for typographical errors.  Ronny Bacon M.D., FACS 09/25/2022, 8:53 PM

## 2022-09-25 NOTE — Assessment & Plan Note (Addendum)
Possible mild exacerbation of CHF Last hospitalized for CHF exacerbation in June 2023, with last cardiology follow-up 6/27 EF 50 to 55% 03/2022 CT abd and pelvis showed: Interval development of trace interstitial pulmonary edema and small right pleural effusion possibly reflecting changes of mild cardiogenic failure" Follow BNP, troponin and chest x-ray Will switch home oral Lasix to IV while n.p.o. Daily weights with intake and output monitoring Will get cardiology consult for any additional preoperative recommendations

## 2022-09-25 NOTE — ED Provider Notes (Signed)
[facility] Provider Note    Event Date/Time   First MD Initiated Contact with Patient 09/25/22 1808     (approximate)  History   Chief Complaint: Abdominal Pain  HPI  Leroy Simmons is a 51 y.o. male with a past medical history of anxiety, CHF, diabetes, hypertension, presents to the emergency department for right lower quadrant abdominal pain.  According to the patient for the past 3 days he has been experiencing pain in the right lower quadrant.  Described as a moderate discomfort.  Denies any vomiting or diarrhea but does state some darker urine recently.  No dysuria.  No history of kidney stones but does state a family history.  Physical Exam   Triage Vital Signs: ED Triage Vitals  Enc Vitals Group     BP 09/25/22 1436 (!) 180/103     Pulse Rate 09/25/22 1436 99     Resp 09/25/22 1436 18     Temp 09/25/22 1436 98.2 F (36.8 C)     Temp Source 09/25/22 1436 Oral     SpO2 09/25/22 1436 98 %     Weight 09/25/22 1437 220 lb (99.8 kg)     Height 09/25/22 1437 5\' 7"  (1.702 m)     Head Circumference --      Peak Flow --      Pain Score 09/25/22 1435 9     Pain Loc --      Pain Edu? --      Excl. in GC? --     Most recent vital signs: Vitals:   09/25/22 1436 09/25/22 1830  BP: (!) 180/103 (S) (!) 181/105  Pulse: 99 (!) 101  Resp: 18 20  Temp: 98.2 F (36.8 C) 98.9 F (37.2 C)  SpO2: 98% 99%    General: Awake, no distress.  CV:  Good peripheral perfusion.  Regular rate and rhythm  Resp:  Normal effort.  Equal breath sounds bilaterally.  Abd:  No distention.  Soft, mild to moderate right lower quadrant tenderness without rebound or guarding.  Slight suprapubic tenderness.   ED Results / Procedures / Treatments   RADIOLOGY  I have reviewed and interpreted the patient's CT images he appears to have quite a bit of inflammation/stranding to the right lower quadrant. Radiology is read the CT is consistent with acute gangrenous appendicitis.   No perforation.   MEDICATIONS ORDERED IN ED: Medications  morphine (PF) 4 MG/ML injection 4 mg (4 mg Intravenous Given 09/25/22 1844)  ondansetron (ZOFRAN) injection 4 mg (4 mg Intravenous Given 09/25/22 1843)  sodium chloride 0.9 % bolus 1,000 mL (1,000 mLs Intravenous New Bag/Given 09/25/22 1841)     IMPRESSION / MDM / ASSESSMENT AND PLAN / ED COURSE  I reviewed the triage vital signs and the nursing notes.  Patient's presentation is most consistent with acute presentation with potential threat to life or bodily function.  Patient presents to the emergency department for right lower quadrant abdominal pain, moderate tenderness to palpation to this area.  Differential would include appendicitis, diverticulitis, colitis, constipation, UTI or pyelonephritis or other intra-abdominal pathology.  Patient's workup shows moderate leukocytosis with white blood cell count of 14,000, chemistry overall reassuring mild renal insufficiency, lipase is normal, urinalysis does show bacteria with white cells.  Will cover with a dose of IV antibiotics.  Will obtain CT imaging to help rule out intra-abdominal pathology.  Patient agreeable to plan of care.  Patient CTs can is consistent with gangrenous appendicitis.  We will  order IV Zosyn for the patient I will discuss with general surgery for further treatment.  Patient will require admission and likely surgery.  I spoke with Dr. Claudine Mouton who will be admitting to his service for surgery either tonight versus tomorrow.  I have updated the patient.  FINAL CLINICAL IMPRESSION(S) / ED DIAGNOSES   Acute appendicitis    Note:  This document was prepared using Dragon voice recognition software and may include unintentional dictation errors.   Minna Antis, MD 09/25/22 2043

## 2022-09-25 NOTE — Assessment & Plan Note (Signed)
Renal function at baseline 

## 2022-09-25 NOTE — Assessment & Plan Note (Signed)
Replaced.  Since holding Lasix I will hold off on further potassium supplementation.

## 2022-09-25 NOTE — Assessment & Plan Note (Addendum)
s/p cardiac cath 03/21/2022 that showed nonobstructive CAD Patient has no complaints of chest pain Baseline EKG and troponin ordered, pending

## 2022-09-25 NOTE — Assessment & Plan Note (Signed)
Last hemoglobin A1c 9.8, 6 months ago.  Patient currently on sliding scale insulin.  Last sugar 155.

## 2022-09-25 NOTE — ED Triage Notes (Signed)
Pt states he has had RLQ pain x3 days- pt has had nausea and chills, loss of appetite- pt does also have diarrhea- pt denies any urinary symptoms

## 2022-09-25 NOTE — Consult Note (Signed)
Initial Consultation Note   Patient: Leroy Simmons OQH:[id number] DOB: Mar 02, 1971 PCP: de [place], Raymond J, MD DOA: 09/25/2022 DOS: the patient was seen and examined on 09/25/2022 Primary service: Ronny Bacon, MD  Referring physician: Dr Christian Mate Reason for consult: Management of medical problems  Assessment/Plan: Assessment and Plan: * Acute gangrenous appendicitis Admitted to the surgical service by Dr. Christian Mate Management per surgical team Currently on Zosyn, IV fluids, IV pain meds and IV antiemetics  Hypertensive urgency BP 181/105, suspect in part related to pain from acute appendicitis Was placed on IV hydralazine as needed by surgery which is okay to continue  Will give the Lasix, hold metoprolol, isosorbide while n.p.o. Pain control to assist with BP control  Chronic diastolic CHF (congestive heart failure) (HCC) Possible mild exacerbation of CHF Last hospitalized for CHF exacerbation in June 2023, with last cardiology follow-up 6/27 EF 50 to 55% 03/2022 CT abd and pelvis showed: Interval development of trace interstitial pulmonary edema and small right pleural effusion possibly reflecting changes of mild cardiogenic failure" Follow BNP, troponin and chest x-ray Will switch home oral Lasix to IV while n.p.o. Daily weights with intake and output monitoring Will get cardiology consult for any additional preoperative recommendations  Uncontrolled type 2 diabetes mellitus with hyperglycemia, with long-term current use of insulin (HCC) Blood sugar 236 Will give half dose home basal insulin with sliding scale coverage  CKD (chronic kidney disease) stage 2, GFR 60-89 ml/min Renal function at baseline  Pulmonary nodule Continue outpatient surveillance  Coronary atherosclerosis of native coronary artery s/p cardiac cath 03/21/2022 that showed nonobstructive CAD Patient has no complaints of chest pain Baseline EKG and troponin ordered, pending  Anxiety and  depression Hold home buspirone and sertraline while n.p.o.  Asthma Not acutely exacerbated DuoNebs as needed  HYPOKALEMIA Potassium 3.2 IV repletion and monitor       TRH will continue to follow the patient.  HPI: Leroy Simmons is a 51 y.o. male with past medical history of anxiety, depression, HTN, CKD 2 diabetes mellitus, chronic diastolic heart failure (EF 50 to 55% 03/2022), CAD s/p cardiac cath 03/21/2022 that showed nonobstructive CAD, hospitalized for CHF exacerbation in June 2023, with last cardiology follow-up 6/27, who is being seen in consultation for management of medical problems after being admitted to the surgical service with acute appendicitis.  He presented to the ED with a 3-day history of RLQ pain, loss of appetite and diarrhea, with CT abdomen and pelvis showing acute gangrenous appendicitis without perforation as well as concerns for "Interval development of trace interstitial pulmonary edema and small right pleural effusion possibly reflecting changes of mild cardiogenic failure". Patient has had no recent chest pain, shortness of breath, weight gain lower extremity edema, orthopnea.  Denies palpitations and lightheadedness. On arrival in the ED BP was 181/105 and he was tachycardic to 101 with otherwise normal vitals.  CBC showed WBC 14,000, hemoglobin 11.9.  Troponin and BNP not done.  Other pertinent findings included glucose of 236, creatinine At baseline at 1.37, potassium 3.2.  Urinalysis showed many bacteria.  Lipase normal transaminases WNL, mild bilirubin elevation to 1.6. EKG, not yet done. Patient was treated with IV hydralazine for BP in the ED and was admitted to the surgical service.  Hospitalist consulted by primary surgical service.  Review of Systems: As mentioned in the history of present illness. All other systems reviewed and are negative. Past Medical History:  Diagnosis Date   Anxiety    CHF (congestive heart failure) ([place])  Depression     Diabetes mellitus without complication ([place])    Hypertension    Past Surgical History:  Procedure Laterality Date   LEFT HEART CATH AND CORONARY ANGIOGRAPHY N/A 03/21/2022   Procedure: LEFT HEART CATH AND CORONARY ANGIOGRAPHY;  Surgeon: Jettie Booze, MD;  Location: [facility];  Service: Cardiovascular;  Laterality: N/A;   Social History:  reports that he has never smoked. He has never used smokeless tobacco. He reports current alcohol use. He reports that he does not use drugs.  No Known Allergies  Family History  Problem Relation Age of Onset   Heart failure Mother     Prior to Admission medications   Medication Sig Start Date End Date Taking? Authorizing Provider  amLODipine (NORVASC) 5 MG tablet Take 1 tablet (5 mg total) by mouth daily. 05/24/22   [place], Blondell Reveal, MD  blood glucose meter kit and supplies Dispense based on patient and insurance preference. Use up to four times daily as directed. (FOR ICD-10 E10.9, E11.9). 05/24/22   [place], Blondell Reveal, MD  busPIRone (BUSPAR) 5 MG tablet TAKE 1 & 1/2 (ONE & ONE-HALF) TABLETS BY MOUTH TWICE DAILY 06/14/22   [place], Blondell Reveal, MD  doxycycline (VIBRAMYCIN) 100 MG capsule Take 1 capsule (100 mg total) by mouth 2 (two) times daily. 07/29/22   Smoot, Leary Roca, PA-C  furosemide (LASIX) 40 MG tablet Take 1 tablet (40 mg total) by mouth daily. 06/06/22   Mariel Aloe, MD  insulin glargine (LANTUS) 100 unit/mL SOPN Inject 26 Units into the skin daily. Patient taking differently: Inject 26 Units into the skin in the morning. 05/24/22   [place], Blondell Reveal, MD  Insulin Pen Needle (PEN NEEDLES 3/16") 31G X 5 MM MISC 1 each by Does not apply route daily. 01/18/22   [place], Raymond J, MD  isosorbide mononitrate (IMDUR) 30 MG 24 hr tablet Take 0.5 tablets (15 mg total) by mouth daily. Patient not taking: Reported on 06/02/2022 03/29/22   Lenna Sciara, NP  JARDIANCE 10 MG TABS tablet Take 1 tablet (10 mg total) by mouth every morning.  05/24/22   [place], Blondell Reveal, MD  metFORMIN (GLUCOPHAGE-XR) 500 MG 24 hr tablet Take 4 tablets (2,000 mg total) by mouth at bedtime. Patient taking differently: Take 1,000 mg by mouth at bedtime. 05/24/22   [place], Blondell Reveal, MD  metoprolol succinate (TOPROL-XL) 50 MG 24 hr tablet Take 1 tablet (50 mg total) by mouth daily. Take with or immediately following a meal. Patient taking differently: Take 50 mg by mouth daily. 05/24/22   [place], Blondell Reveal, MD  rosuvastatin (CRESTOR) 20 MG tablet Take 1 tablet (20 mg total) by mouth daily. 05/24/22 08/22/22  [place], Raymond J, MD  sertraline (ZOLOFT) 25 MG tablet Take 1 tablet (25 mg total) by mouth daily. Patient not taking: Reported on 06/02/2022 01/18/22   [place], Blondell Reveal, MD    Physical Exam: Vitals:   09/25/22 1436 09/25/22 1437 09/25/22 1830  BP: (!) 180/103  (S) (!) 181/105  Pulse: 99  (!) 101  Resp: 18  20  Temp: 98.2 F (36.8 C)  98.9 F (37.2 C)  TempSrc: Oral  Oral  SpO2: 98%  99%  Weight:  99.8 kg   Height:  _0  (1.702 m)    Physical Exam Vitals and nursing note reviewed.  Constitutional:      General: He is not in acute distress. HENT:  Head: Normocephalic and atraumatic.  Cardiovascular:     Rate and Rhythm: Normal rate and regular rhythm.     Heart sounds: Normal heart sounds.  Pulmonary:     Effort: Pulmonary effort is normal.     Breath sounds: Normal breath sounds.  Abdominal:     Palpations: Abdomen is soft.     Tenderness: There is no abdominal tenderness.  Genitourinary:    Testes:        Right: Swelling present.        Left: Swelling present.  Neurological:     Mental Status: Mental status is at baseline.     Data Reviewed: Relevant notes from primary care and specialist visits, past discharge summaries as available in EHR, including Care Everywhere. Prior diagnostic testing as pertinent to current admission diagnoses Updated medications and problem lists for reconciliation ED course, including  vitals, labs, imaging, treatment and response to treatment Triage notes, nursing and pharmacy notes and ED provider's notes Notable results as noted in HPI   Family Communication:  Primary team communication:  Thank you very much for involving [place] in the care of your patient.  Author: Athena Masse, MD 09/25/2022 9:53 PM  For on call review [email or web address].

## 2022-09-25 NOTE — ED Notes (Signed)
Pt reports he has NOT taken his BP meds in 4 days

## 2022-09-25 NOTE — Assessment & Plan Note (Signed)
Restart BuSpar

## 2022-09-25 NOTE — Assessment & Plan Note (Addendum)
Admitted to the surgical service by Dr. Claudine Mouton and taken to the OR on 12/25. -Management per surgical team.

## 2022-09-25 NOTE — Assessment & Plan Note (Signed)
Not acutely exacerbated DuoNebs as needed 

## 2022-09-26 ENCOUNTER — Encounter
Admission: EM | Disposition: A | Discharge status: Home / Self Care | Payer: Self-pay | Source: Home / Self Care | Attending: Surgery

## 2022-09-26 ENCOUNTER — Inpatient Hospital Stay: Payer: Self-pay | Admitting: Anesthesiology

## 2022-09-26 DIAGNOSIS — I5030 Unspecified diastolic (congestive) heart failure: Secondary | ICD-10-CM

## 2022-09-26 DIAGNOSIS — E669 Obesity, unspecified: Secondary | ICD-10-CM

## 2022-09-26 DIAGNOSIS — E876 Hypokalemia: Secondary | ICD-10-CM

## 2022-09-26 DIAGNOSIS — E1165 Type 2 diabetes mellitus with hyperglycemia: Secondary | ICD-10-CM

## 2022-09-26 DIAGNOSIS — F419 Anxiety disorder, unspecified: Secondary | ICD-10-CM

## 2022-09-26 DIAGNOSIS — I160 Hypertensive urgency: Secondary | ICD-10-CM

## 2022-09-26 DIAGNOSIS — N182 Chronic kidney disease, stage 2 (mild): Secondary | ICD-10-CM

## 2022-09-26 DIAGNOSIS — F32A Depression, unspecified: Secondary | ICD-10-CM

## 2022-09-26 DIAGNOSIS — Z01810 Encounter for preprocedural cardiovascular examination: Secondary | ICD-10-CM

## 2022-09-26 DIAGNOSIS — J45909 Unspecified asthma, uncomplicated: Secondary | ICD-10-CM

## 2022-09-26 DIAGNOSIS — I5032 Chronic diastolic (congestive) heart failure: Secondary | ICD-10-CM

## 2022-09-26 DIAGNOSIS — R911 Solitary pulmonary nodule: Secondary | ICD-10-CM

## 2022-09-26 DIAGNOSIS — Z794 Long term (current) use of insulin: Secondary | ICD-10-CM

## 2022-09-26 DIAGNOSIS — I2510 Atherosclerotic heart disease of native coronary artery without angina pectoris: Secondary | ICD-10-CM

## 2022-09-26 HISTORY — PX: LAPAROSCOPIC APPENDECTOMY: SHX408

## 2022-09-26 LAB — BASIC METABOLIC PANEL
Anion gap: 8 (ref 5–15)
BUN: 25 mg/dL — ABNORMAL HIGH (ref 6–20)
CO2: 24 mmol/L (ref 22–32)
Calcium: 8.2 mg/dL — ABNORMAL LOW (ref 8.9–10.3)
Chloride: 103 mmol/L (ref 98–111)
Creatinine, Ser: 1.35 mg/dL — ABNORMAL HIGH (ref 0.61–1.24)
GFR, Estimated: >60 mL/min (ref >60)
Glucose, Bld: 168 mg/dL — ABNORMAL HIGH (ref 70–99)
Potassium: 3.2 mmol/L — ABNORMAL LOW (ref 3.5–5.1)
Sodium: 135 mmol/L (ref 135–145)

## 2022-09-26 LAB — CBG MONITORING, ED
Glucose-Capillary: 142 mg/dL — ABNORMAL HIGH (ref 70–99)
Glucose-Capillary: 151 mg/dL — ABNORMAL HIGH (ref 70–99)
Glucose-Capillary: 151 mg/dL — ABNORMAL HIGH (ref 70–99)
Glucose-Capillary: 155 mg/dL — ABNORMAL HIGH (ref 70–99)

## 2022-09-26 LAB — TROPONIN I (HIGH SENSITIVITY)
Troponin I (High Sensitivity): 45 ng/L — ABNORMAL HIGH (ref <18)
Troponin I (High Sensitivity): 34 ng/L — ABNORMAL HIGH (ref <18)

## 2022-09-26 LAB — BRAIN NATRIURETIC PEPTIDE: B Natriuretic Peptide: 211.5 pg/mL — ABNORMAL HIGH (ref 0.0–100.0)

## 2022-09-26 LAB — GLUCOSE, CAPILLARY
Glucose-Capillary: 217 mg/dL — ABNORMAL HIGH (ref 70–99)
Glucose-Capillary: 312 mg/dL — ABNORMAL HIGH (ref 70–99)

## 2022-09-26 SURGERY — APPENDECTOMY, LAPAROSCOPIC
Anesthesia: General

## 2022-09-26 MED ORDER — BUPIVACAINE-EPINEPHRINE 0.25% -1:200000 IJ SOLN
INTRAMUSCULAR | Status: DC | PRN
  Administered 2022-09-26 (×2): 10 mL

## 2022-09-26 MED ORDER — DEXAMETHASONE SODIUM PHOSPHATE 10 MG/ML IJ SOLN
INTRAMUSCULAR | Status: AC
  Filled 2022-09-26: qty 1

## 2022-09-26 MED ORDER — OXYCODONE HCL 5 MG/5ML PO SOLN: 5.0000 mg | Freq: Once | ORAL | Status: AC | PRN

## 2022-09-26 MED ORDER — POTASSIUM CHLORIDE 10 MEQ/100ML IV SOLN
INTRAVENOUS | Status: AC
  Administered 2022-09-26: 10 meq via INTRAVENOUS
  Filled 2022-09-26: qty 100

## 2022-09-26 MED ORDER — DEXMEDETOMIDINE HCL IN NACL 200 MCG/50ML IV SOLN
INTRAVENOUS | Status: DC | PRN
  Administered 2022-09-26 (×2): 8 ug via INTRAVENOUS

## 2022-09-26 MED ORDER — OXYCODONE-ACETAMINOPHEN 5-325 MG PO TABS
1.0000 | ORAL_TABLET | ORAL | Status: DC | PRN
  Administered 2022-09-26: 1 via ORAL
  Administered 2022-09-27 – 2022-09-29 (×3): 2 via ORAL
  Filled 2022-09-26: qty 2
  Filled 2022-09-26: qty 1
  Filled 2022-09-26 (×2): qty 2

## 2022-09-26 MED ORDER — LIDOCAINE HCL (CARDIAC) PF 100 MG/5ML IV SOSY
PREFILLED_SYRINGE | INTRAVENOUS | Status: DC | PRN
  Administered 2022-09-26: 50 mg via INTRAVENOUS

## 2022-09-26 MED ORDER — BUPIVACAINE-EPINEPHRINE (PF) 0.25% -1:200000 IJ SOLN
INTRAMUSCULAR | Status: AC
  Filled 2022-09-26: qty 30

## 2022-09-26 MED ORDER — FENTANYL CITRATE (PF) 100 MCG/2ML IJ SOLN
INTRAMUSCULAR | Status: DC | PRN
  Administered 2022-09-26 (×2): 50 ug via INTRAVENOUS

## 2022-09-26 MED ORDER — HYDROMORPHONE HCL 1 MG/ML IJ SOLN
1.0000 mg | INTRAMUSCULAR | Status: DC | PRN
  Administered 2022-09-28 – 2022-09-30 (×3): 1 mg via INTRAVENOUS
  Filled 2022-09-26 (×4): qty 1

## 2022-09-26 MED ORDER — ROCURONIUM BROMIDE 100 MG/10ML IV SOLN
INTRAVENOUS | Status: DC | PRN
  Administered 2022-09-26: 70 mg via INTRAVENOUS

## 2022-09-26 MED ORDER — FENTANYL CITRATE (PF) 100 MCG/2ML IJ SOLN: 25.0000 ug | INTRAMUSCULAR | Status: DC | PRN

## 2022-09-26 MED ORDER — PROPOFOL 10 MG/ML IV BOLUS
INTRAVENOUS | Status: AC
  Filled 2022-09-26: qty 20

## 2022-09-26 MED ORDER — SUGAMMADEX SODIUM 200 MG/2ML IV SOLN
INTRAVENOUS | Status: DC | PRN
  Administered 2022-09-26: 200 mg via INTRAVENOUS

## 2022-09-26 MED ORDER — DEXAMETHASONE SODIUM PHOSPHATE 10 MG/ML IJ SOLN
INTRAMUSCULAR | Status: DC | PRN
  Administered 2022-09-26: 5 mg via INTRAVENOUS

## 2022-09-26 MED ORDER — ONDANSETRON 4 MG PO TBDP: 4.0000 mg | ORAL_TABLET | Freq: Four times a day (QID) | ORAL | Status: DC | PRN

## 2022-09-26 MED ORDER — ALBUTEROL SULFATE HFA 108 (90 BASE) MCG/ACT IN AERS
INHALATION_SPRAY | RESPIRATORY_TRACT | Status: AC
  Filled 2022-09-26: qty 6.7

## 2022-09-26 MED ORDER — MIDAZOLAM HCL 2 MG/2ML IJ SOLN
INTRAMUSCULAR | Status: AC
  Filled 2022-09-26: qty 2

## 2022-09-26 MED ORDER — SODIUM CHLORIDE 0.9 % IV SOLN: INTRAVENOUS | Status: DC | PRN

## 2022-09-26 MED ORDER — POTASSIUM CHLORIDE 10 MEQ/100ML IV SOLN
10.0000 meq | INTRAVENOUS | Status: AC
  Administered 2022-09-26 (×2): 10 meq via INTRAVENOUS
  Filled 2022-09-26 (×2): qty 100

## 2022-09-26 MED ORDER — ONDANSETRON HCL 4 MG/2ML IJ SOLN
INTRAMUSCULAR | Status: DC | PRN
  Administered 2022-09-26: 4 mg via INTRAVENOUS

## 2022-09-26 MED ORDER — ROCURONIUM BROMIDE 10 MG/ML (PF) SYRINGE
PREFILLED_SYRINGE | INTRAVENOUS | Status: AC
  Filled 2022-09-26: qty 10

## 2022-09-26 MED ORDER — OXYCODONE HCL 5 MG PO TABS
5.0000 mg | ORAL_TABLET | Freq: Once | ORAL | Status: AC | PRN
  Administered 2022-09-26: 5 mg via ORAL

## 2022-09-26 MED ORDER — PIPERACILLIN-TAZOBACTAM 3.375 G IVPB
INTRAVENOUS | Status: AC
  Administered 2022-09-26: 3.375 g via INTRAVENOUS
  Filled 2022-09-26: qty 50

## 2022-09-26 MED ORDER — PIPERACILLIN-TAZOBACTAM 3.375 G IVPB
3.3750 g | Freq: Three times a day (TID) | INTRAVENOUS | Status: DC
  Administered 2022-09-26 – 2022-09-28 (×5): 3.375 g via INTRAVENOUS
  Filled 2022-09-26 (×5): qty 50

## 2022-09-26 MED ORDER — SODIUM CHLORIDE 0.9 % IR SOLN
Status: DC | PRN
  Administered 2022-09-26: 3000 mL

## 2022-09-26 MED ORDER — OXYCODONE HCL 5 MG PO TABS
ORAL_TABLET | ORAL | Status: AC
  Filled 2022-09-26: qty 1

## 2022-09-26 MED ORDER — 0.9 % SODIUM CHLORIDE (POUR BTL) OPTIME
TOPICAL | Status: DC | PRN
  Administered 2022-09-26: 500 mL

## 2022-09-26 MED ORDER — PROPOFOL 10 MG/ML IV BOLUS
INTRAVENOUS | Status: DC | PRN
  Administered 2022-09-26: 50 mg via INTRAVENOUS
  Administered 2022-09-26: 150 mg via INTRAVENOUS

## 2022-09-26 MED ORDER — ONDANSETRON HCL 4 MG/2ML IJ SOLN
INTRAMUSCULAR | Status: AC
  Filled 2022-09-26: qty 2

## 2022-09-26 MED ORDER — ONDANSETRON HCL 4 MG/2ML IJ SOLN
4.0000 mg | Freq: Four times a day (QID) | INTRAMUSCULAR | Status: DC | PRN
  Administered 2022-09-29 – 2022-09-30 (×2): 4 mg via INTRAVENOUS
  Filled 2022-09-26 (×2): qty 2

## 2022-09-26 MED ORDER — ALBUTEROL SULFATE HFA 108 (90 BASE) MCG/ACT IN AERS
INHALATION_SPRAY | RESPIRATORY_TRACT | Status: DC | PRN
  Administered 2022-09-26: 6 via RESPIRATORY_TRACT
  Administered 2022-09-26: 8 via RESPIRATORY_TRACT

## 2022-09-26 MED ORDER — PANTOPRAZOLE SODIUM 40 MG IV SOLR
40.0000 mg | Freq: Every day | INTRAVENOUS | Status: DC
  Administered 2022-09-26 – 2022-09-29 (×4): 40 mg via INTRAVENOUS
  Filled 2022-09-26 (×4): qty 10

## 2022-09-26 MED ORDER — FENTANYL CITRATE (PF) 100 MCG/2ML IJ SOLN
INTRAMUSCULAR | Status: AC
  Filled 2022-09-26: qty 2

## 2022-09-26 SURGICAL SUPPLY — 43 items
ADH SKN CLS APL DERMABOND .7 (GAUZE/BANDAGES/DRESSINGS) ×1
BLADE CLIPPER SURG (BLADE) ×2 IMPLANT
BULB RESERV EVAC DRAIN JP 100C (MISCELLANEOUS) IMPLANT
CUTTER FLEX LINEAR 45M (STAPLE) ×2 IMPLANT
DERMABOND ADVANCED .7 DNX12 (GAUZE/BANDAGES/DRESSINGS) ×1 IMPLANT
DRAIN CHANNEL JP 19F (MISCELLANEOUS) IMPLANT
ELECT REM PT RETURN 9FT ADLT (ELECTROSURGICAL) ×1
ELECTRODE REM PT RTRN 9FT ADLT (ELECTROSURGICAL) ×1 IMPLANT
GAUZE SPONGE 4X4 12PLY STRL (GAUZE/BANDAGES/DRESSINGS) IMPLANT
GLOVE ORTHO TXT STRL SZ7.5 (GLOVE) ×2 IMPLANT
GOWN STRL REUS W/ TWL LRG LVL3 (GOWN DISPOSABLE) ×1 IMPLANT
GOWN STRL REUS W/ TWL XL LVL3 (GOWN DISPOSABLE) ×1 IMPLANT
GOWN STRL REUS W/TWL LRG LVL3 (GOWN DISPOSABLE) ×1
GOWN STRL REUS W/TWL XL LVL3 (GOWN DISPOSABLE) ×1
GRASPER SUT TROCAR 14GX15 (MISCELLANEOUS) IMPLANT
IRRIGATION STRYKERFLOW (MISCELLANEOUS) IMPLANT
IRRIGATOR STRYKERFLOW (MISCELLANEOUS) ×1
IV NS IRRIG 3000ML ARTHROMATIC (IV SOLUTION) IMPLANT
KIT TURNOVER KIT A (KITS) ×1 IMPLANT
MANIFOLD NEPTUNE II (INSTRUMENTS) ×1 IMPLANT
NDL INSUFFLATION 14GA 120MM (NEEDLE) IMPLANT
NEEDLE HYPO 22GX1.5 SAFETY (NEEDLE) ×2 IMPLANT
NEEDLE INSUFFLATION 14GA 120MM (NEEDLE) ×1 IMPLANT
NS IRRIG 500ML POUR BTL (IV SOLUTION) ×1 IMPLANT
PACK LAP CHOLECYSTECTOMY (MISCELLANEOUS) ×2 IMPLANT
RELOAD 45 VASCULAR/THIN (ENDOMECHANICALS) ×2 IMPLANT
RELOAD STAPLE 45 2.5 WHT GRN (ENDOMECHANICALS) ×1 IMPLANT
SET TUBE SMOKE EVAC HIGH FLOW (TUBING) ×2 IMPLANT
SHEARS HARMONIC ACE PLUS 36CM (ENDOMECHANICALS) ×2 IMPLANT
SLEEVE Z-THREAD 5X100MM (TROCAR) ×1 IMPLANT
SPIKE FLUID TRANSFER (MISCELLANEOUS) ×2 IMPLANT
SUT ETH BLK MONO 3 0 FS 1 12/B (SUTURE) IMPLANT
SUT MNCRL 4-0 (SUTURE) ×1
SUT MNCRL 4-0 27XMFL (SUTURE) ×1
SUT VICRYL 0 UR6 27IN ABS (SUTURE) IMPLANT
SUTURE MNCRL 4-0 27XMF (SUTURE) ×1 IMPLANT
SYS BAG RETRIEVAL 10MM (BASKET) ×1
SYSTEM BAG RETRIEVAL 10MM (BASKET) ×2 IMPLANT
TRAP FLUID SMOKE EVACUATOR (MISCELLANEOUS) ×2 IMPLANT
TRAY FOLEY MTR SLVR 16FR STAT (SET/KITS/TRAYS/PACK) ×2 IMPLANT
TROCAR ADV FIXATION 12X100MM (TROCAR) ×2 IMPLANT
TROCAR Z-THREAD OPTICAL 5X100M (TROCAR) ×1 IMPLANT
WATER STERILE IRR 500ML POUR (IV SOLUTION) ×1 IMPLANT

## 2022-09-26 NOTE — Op Note (Signed)
Laparascopic appendectomy   Leroy Simmons Date of operation:  09/26/2022  Indications: The patient presented with a history of  abdominal pain. Workup has revealed findings consistent with acute appendicitis.  Pre-operative Diagnosis: Appendicitis, unqualified  Post-operative Diagnosis: Acute appendicitis with peritoneal abscess  Surgeon: Campbell Lerner, M.D., FACS  Anesthesia: General with endotracheal tube  Findings: Right lower quadrant peritoneal abscess with marked surrounding inflammatory changes, induration and edema.  Estimated Blood Loss: 25         Specimens: appendix         Complications: None  Procedure Details  The patient was seen again in the preop area. The options of surgery versus observation were reviewed with the patient and/or family. The risks of bleeding, infection, recurrence of symptoms, negative laparoscopy, potential for an open procedure, bowel injury, abscess or infection, were all reviewed as well. The patient was taken to Operating Room, identified as Leroy Simmons and the procedure verified as laparoscopic appendectomy. A Time Out was held and the above information confirmed. The patient was placed in the supine position and general anesthesia was induced.  Antibiotic prophylaxis was administered pre-op and VT E prophylaxis was in place.   After local infiltration of quarter percent Marcaine with epinephrine, stab incision was made left upper quadrant.  Just below the costal margin approximately midclavicular line the Veress needle is passed with sensation of the layers to penetrate the abdominal wall and into the peritoneum.  Saline drop test is confirmed peritoneal placement.  Insufflation is initiated with carbon dioxide to pressures of 15 mmHg.  The abdomen was prepped and draped in a sterile fashion. Local infiltration with 0.25% Marcaine with epi is administered to all incisions.  An infraumbilical incision was made.  A towel grasper is applied  for countertraction, and a 5 mm optical trocar was passed into the peritoneal cavity under direct visualization.  Pneumoperitoneum obtained. One 12 mm port in the LLQ, and another 5 mm port were placed under direct visualization.  The appendix was identified.  The appendix was carefully dissected, draining the periappendiceal abscess, and mobilizing the soft tissues surrounding the appendix. The mesoappendix was divided with Harmonic scalpel. The base of the appendix was dissected out and divided with 45 mm white load Endo GIAs.The appendix was placed in a Endo Catch bag and removed via the 12 mm LLQ port. The right lower quadrant and pelvis was then irrigated with  normal saline which was aspirated. Inspection  failed to identify any additional bleeding and there were no signs of bowel injury. Again the right lower quadrant was inspected there was no sign of bleeding or bowel injury.   Due to the well-established fibrinous exudate adherent in the right lower quadrant, the majority of this was removed by peeling and suction.  A drain was placed, 19 Blake in the right lower quadrant via the supra pubic trocar site. The LLQ fascia was closed with 0 Vicryl using the suture passer, pneumoperitoneum was released, all ports were removed, and the skin incisions were approximated with subcuticular 4-0 Monocryl. Dermabond was applied.  Drain sponges applied. The patient tolerated the procedure well, there were no complications. The sponge lap and needle count were correct at the end of the procedure.  The patient was taken to the recovery room in stable condition to be discharged to home, probably in the morning, when appropriate.   Campbell Lerner, M.D., [facility] 09/26/2022 - 2:41 PM

## 2022-09-26 NOTE — ED Notes (Signed)
Pt is picked up by OR for surgery.

## 2022-09-26 NOTE — Transfer of Care (Signed)
Immediate Anesthesia Transfer of Care Note  Patient: Leroy Simmons  Procedure(s) Performed: APPENDECTOMY LAPAROSCOPIC  Patient Location: PACU  Anesthesia Type:General  Level of Consciousness: drowsy and patient cooperative  Airway & Oxygen Therapy: Patient Spontanous Breathing and Patient connected to face mask oxygen  Post-op Assessment: Report given to RN and Post -op Vital signs reviewed and stable  Post vital signs: Reviewed and stable  Last Vitals:  Vitals Value Taken Time  BP 81/58 09/26/22 1435  Temp    Pulse 73 09/26/22 1437  Resp 20 09/26/22 1435  SpO2 96 % 09/26/22 1437  Vitals shown include unvalidated device data.  Last Pain:  Vitals:   09/26/22 1000  TempSrc: Temporal  PainSc:          Complications:  Encounter Notable Events  Notable Event Outcome Phase Comment  Difficult to intubate - unexpected  Intraprocedure Filed from anesthesia note documentation.

## 2022-09-26 NOTE — Assessment & Plan Note (Signed)
BMI 34.46

## 2022-09-26 NOTE — Interval H&P Note (Signed)
History and Physical Interval Note:  09/26/2022 9:38 AM  Leroy Simmons  has presented today for surgery, with the diagnosis of acute appendicitis.  The various methods of treatment have been discussed with the patient and family. After consideration of risks, benefits and other options for treatment, the patient has consented to  Procedure(s): APPENDECTOMY LAPAROSCOPIC (N/A) as a surgical intervention.  The patient's history has been reviewed, patient examined, no change in status, stable for surgery.  I have reviewed the patient's chart and labs.  Questions were answered to the patient's satisfaction.     Campbell Lerner

## 2022-09-26 NOTE — Hospital Course (Addendum)
51 y.o. male with past medical history of anxiety, depression, HTN, CKD 2 diabetes mellitus, chronic diastolic heart failure (EF 50 to 55% 03/2022), CAD s/p cardiac cath 03/21/2022 that showed nonobstructive CAD, hospitalized for CHF exacerbation in June 2023, with last cardiology follow-up 6/27, who is being seen in consultation for management of medical problems after being admitted to the surgical service with acute appendicitis.  He presented to the ED with a 3-day history of RLQ pain, loss of appetite and diarrhea, with CT abdomen and pelvis showing acute gangrenous appendicitis without perforation as well as concerns for "Interval development of trace interstitial pulmonary edema and small right pleural effusion possibly reflecting changes of mild cardiogenic failure". Patient has had no recent chest pain, shortness of breath, weight gain lower extremity edema, orthopnea.  Denies palpitations and lightheadedness. On arrival in the ED BP was 181/105 and he was tachycardic to 101 with otherwise normal vitals.  CBC showed WBC 14,000, hemoglobin 11.9.  Troponin and BNP not done.  Other pertinent findings included glucose of 236, creatinine At baseline at 1.37, potassium 3.2.  Urinalysis showed many bacteria.  Lipase normal transaminases WNL, mild bilirubin elevation to 1.6. EKG, not yet done. Patient was treated with IV hydralazine for BP in the ED and was admitted to the surgical service.  Hospitalist consulted by primary surgical service.  12/26.  Patient feeling better and eating.  Creatinine this morning went up to 2.06.  A fluid bolus was given and creatinine was checked this afternoon and up to 2.51.  Will start IV fluids.  Cancel discharge and spoke with general surgery. 12/27: Creatinine continue to trend up despite getting IV fluid, at 3.02.  Patient did receive contrast on 12/24.  Ordered renal ultrasound and nephrology consult.  Per nephrology note patient has CKD stage II with baseline creatinine  around 1.35.  Significant lower extremity edema so IV fluids were discontinued.  Echocardiogram done yesterday with normal EF, grade 2 diastolic dysfunction and dilated IVC.  I will let nephrology decide about diuresis.  Renal ultrasound was normal. 12/28: Vital stable, some improvement in creatinine to 2.92.  Chest x-ray with concern of interstitial edema, BNP at 178 which is slightly improved from admission.  Hemoglobin stable. Nephrology would like to hold his diuretic at this time. Significant lower extremity edema-ordered some compression stockings with lower extremity elevation. 12/29: Hemodynamically stable and creatinine continue to improve, at 2.15.  Patient is being discharged and he will resume his home Lasix as recommended by nephrology.  Patient need to have a close follow-up with his providers including nephrology. Patient is under surgical team so discharge orders will be placed by them.

## 2022-09-26 NOTE — Anesthesia Procedure Notes (Signed)
Procedure Name: Intubation Date/Time: 09/26/2022 12:44 PM  Performed by: Omer Jack, CRNAPre-anesthesia Checklist: Patient identified, Patient being monitored, Timeout performed, Emergency Drugs available and Suction available Patient Re-evaluated:Patient Re-evaluated prior to induction Oxygen Delivery Method: Circle system utilized Preoxygenation: Pre-oxygenation with 100% oxygen Induction Type: IV induction Ventilation: Two handed mask ventilation required Laryngoscope Size: 4 and McGraph Grade View: Grade III Tube type: Oral Tube size: 7.5 mm Number of attempts: 3 Airway Equipment and Method: Bougie stylet and Stylet Placement Confirmation: ETT inserted through vocal cords under direct vision, positive ETCO2 and breath sounds checked- equal and bilateral Secured at: 21 cm Tube secured with: Tape Dental Injury: Teeth and Oropharynx as per pre-operative assessment  Difficulty Due To: Difficult Airway- due to anterior larynx and Difficulty was unanticipated Future Recommendations: Recommend- induction with short-acting agent, and alternative techniques readily available Comments: Larynx very anterior even with cricoid pressure. Had to two hand mask due to beard. Used flexible bougie to intubate. Needed albuterol directly after intubation. Plan for awake extubation

## 2022-09-26 NOTE — Anesthesia Postprocedure Evaluation (Signed)
Anesthesia Post Note  Patient: JOZSEF WESCOAT  Procedure(s) Performed: APPENDECTOMY LAPAROSCOPIC  Patient location during evaluation: PACU Anesthesia Type: General Level of consciousness: awake and alert Pain management: pain level controlled Vital Signs Assessment: post-procedure vital signs reviewed and stable Respiratory status: spontaneous breathing, nonlabored ventilation, respiratory function stable and patient connected to nasal cannula oxygen Cardiovascular status: blood pressure returned to baseline and stable Postop Assessment: no apparent nausea or vomiting Anesthetic complications: yes  Encounter Notable Events  Notable Event Outcome Phase Comment  Difficult to intubate - unexpected  Intraprocedure Filed from anesthesia note documentation.     Last Vitals:  Vitals:   09/26/22 1843 09/26/22 1941  BP: 139/86 (!) 161/96  Pulse: 86 91  Resp: 16 20  Temp: 36.9 C 36.9 C  SpO2: 98% 99%    Last Pain:  Vitals:   09/26/22 1941  TempSrc: Oral  PainSc:                  Stephanie Coup

## 2022-09-26 NOTE — Progress Notes (Signed)
Progress Note   Patient: Leroy Simmons LTJ:[id number] DOB: 01/27/1971 DOA: 09/25/2022     1 DOS: the patient was seen and examined on 09/26/2022   Brief hospital course: 51 y.o. male with past medical history of anxiety, depression, HTN, CKD 2 diabetes mellitus, chronic diastolic heart failure (EF 50 to 55% 03/2022), CAD s/p cardiac cath 03/21/2022 that showed nonobstructive CAD, hospitalized for CHF exacerbation in June 2023, with last cardiology follow-up 6/27, who is being seen in consultation for management of medical problems after being admitted to the surgical service with acute appendicitis.  He presented to the ED with a 3-day history of RLQ pain, loss of appetite and diarrhea, with CT abdomen and pelvis showing acute gangrenous appendicitis without perforation as well as concerns for "Interval development of trace interstitial pulmonary edema and small right pleural effusion possibly reflecting changes of mild cardiogenic failure". Patient has had no recent chest pain, shortness of breath, weight gain lower extremity edema, orthopnea.  Denies palpitations and lightheadedness. On arrival in the ED BP was 181/105 and he was tachycardic to 101 with otherwise normal vitals.  CBC showed WBC 14,000, hemoglobin 11.9.  Troponin and BNP not done.  Other pertinent findings included glucose of 236, creatinine At baseline at 1.37, potassium 3.2.  Urinalysis showed many bacteria.  Lipase normal transaminases WNL, mild bilirubin elevation to 1.6. EKG, not yet done. Patient was treated with IV hydralazine for BP in the ED and was admitted to the surgical service.  Hospitalist consulted by primary surgical service.  Assessment and Plan: * Acute gangrenous appendicitis Admitted to the surgical service by Dr. Claudine Mouton Surgery must be done to prevent sepsis and death.  Continue IV Zosyn.  Hypertensive urgency Blood pressure likely elevated initially secondary to pain.  Continue to monitor.  As needed IV  medications.   Chronic diastolic CHF (congestive heart failure) (HCC) Patient on IV Lasix.  Patient on room air.  Uncontrolled type 2 diabetes mellitus with hyperglycemia, with long-term current use of insulin (HCC) Last hemoglobin A1c 9.8, 6 months ago.  Patient currently on sliding scale insulin.  Last sugar 155.  Obesity (BMI 30-39.9) BMI 34.46  CKD (chronic kidney disease) stage 2, GFR 60-89 ml/min Renal function at baseline  Pulmonary nodule Continue outpatient surveillance  Coronary atherosclerosis of native coronary artery s/p cardiac cath 03/21/2022 that showed nonobstructive CAD Patient has no complaints of chest pain   Anxiety and depression Hold home buspirone and sertraline while n.p.o.  Asthma Not acutely exacerbated DuoNebs as needed  HYPOKALEMIA Replace IV potassium today        Subjective: Patient feels a little bit better than when he came in.  Still having a little abdominal pain but less than when he came in.  Admitted with gangrenous appendicitis  Physical Exam: Vitals:   09/26/22 0548 09/26/22 0700 09/26/22 0800 09/26/22 1000  BP: (!) 129/94  114/88 (!) 135/97  Pulse: 73 72 72 70  Resp: 14 15 18 14   Temp: 98.7 F (37.1 C)   (!) 97.3 F (36.3 C)  TempSrc:    Temporal  SpO2: 95% 97% 97% 100%  Weight:      Height:       Physical Exam HENT:     Head: Normocephalic.     Mouth/Throat:     Pharynx: No oropharyngeal exudate.  Eyes:     General: Lids are normal.     Conjunctiva/sclera: Conjunctivae normal.  Cardiovascular:     Rate and Rhythm: Normal rate and regular  rhythm.     Heart sounds: Normal heart sounds, S1 normal and S2 normal.  Pulmonary:     Breath sounds: Examination of the right-lower field reveals decreased breath sounds. Examination of the left-lower field reveals decreased breath sounds. Decreased breath sounds present. No wheezing, rhonchi or rales.  Abdominal:     Palpations: Abdomen is soft.     Tenderness: There is  abdominal tenderness in the right lower quadrant and suprapubic area.  Musculoskeletal:     Right lower leg: Swelling present.     Left lower leg: Swelling present.  Skin:    General: Skin is warm.     Findings: No rash.  Neurological:     Mental Status: He is alert and oriented to person, place, and time.     Data Reviewed: Potassium 3.2, creatinine 1.35, BNP 211, troponin downtrending at 45 down to 34,  EKG shows sinus rhythm with PVCs left posterior fascicular block flipped T waves inferiorly and laterally.  Family Communication: As per surgery  Disposition: Status is: Inpatient Remains inpatient appropriate because: To go to the operating room for gangrenous appendix  Planned Discharge Destination: Home    Time spent: 28 minutes  Author: Alford Highland, MD 09/26/2022 11:58 AM  For on call review [email or web address].

## 2022-09-26 NOTE — Consult Note (Signed)
Cardiology Consultation   Patient ID: Leroy Simmons MRN: [id number]; DOB: [birth year]  Admit date: 09/25/2022 Date of Consult: 09/26/2022  PCP:  [place], Raymond J, [facility] Cardiologist:  Elouise Munroe, MD        Patient Profile:   Leroy Simmons is a 51 y.o. 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 Damita Dunnings.  History of Present Illness:   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  States pain located in the right lower quadrant prompting ED presentation.  Workup so far in the ED reveals acute appendicitis, surgical management being planned.  Started on IV fluids and antibiotics, states overall symptoms are better.  Denies chest pain or shortness of breath.  Compliant with cardiac medications as prescribed.  Recent echocardiogram 6 months ago showed normal EF, left heart cath showed nonobstructive CAD.  EKG on admission shows sinus rhythm with PACs.  Troponins 45, 34.   Past Medical History:  Diagnosis Date   Anxiety    CHF (congestive heart failure) ([place])    Depression    Diabetes mellitus without complication ([place])    Hypertension     Past Surgical History:  Procedure Laterality Date   LEFT HEART CATH AND CORONARY ANGIOGRAPHY N/A 03/21/2022   Procedure: LEFT HEART CATH AND CORONARY ANGIOGRAPHY;  Surgeon: Jettie Booze, MD;  Location: [place] [facility];  Service: Cardiovascular;  Laterality: N/A;     Home Medications:  Prior to Admission medications   Medication Sig Start Date End Date Taking? Authorizing Provider  amLODipine (NORVASC) 5 MG tablet Take 1 tablet (5 mg total) by mouth daily. 05/24/22   [place], Blondell Reveal, MD   blood glucose meter kit and supplies Dispense based on patient and insurance preference. Use up to four times daily as directed. (FOR ICD-10 E10.9, E11.9). 05/24/22   [place], Blondell Reveal, MD  busPIRone (BUSPAR) 5 MG tablet TAKE 1 & 1/2 (ONE & ONE-HALF) TABLETS BY MOUTH TWICE DAILY 06/14/22   [place], Blondell Reveal, MD  doxycycline (VIBRAMYCIN) 100 MG capsule Take 1 capsule (100 mg total) by mouth 2 (two) times daily. 07/29/22   Smoot, Leary Roca, PA-C  furosemide (LASIX) 40 MG tablet Take 1 tablet (40 mg total) by mouth daily. 06/06/22   Mariel Aloe, MD  insulin glargine (LANTUS) 100 unit/mL SOPN Inject 26 Units into the skin daily. Patient taking differently: Inject 26 Units into the skin in the morning. 05/24/22   [place], Blondell Reveal, MD  Insulin Pen Needle (PEN NEEDLES 3/16") 31G X 5 MM MISC 1 each by Does not apply route daily. 01/18/22   [place], Raymond J, MD  isosorbide mononitrate (IMDUR) 30 MG 24 hr tablet Take 0.5 tablets (15 mg total) by mouth daily. Patient not taking: Reported on 06/02/2022 03/29/22   Leroy Sciara, NP  JARDIANCE 10 MG TABS tablet Take 1 tablet (10 mg total) by mouth every morning. 05/24/22   [place], Blondell Reveal, MD  metFORMIN (GLUCOPHAGE-XR) 500 MG 24 hr tablet Take 4 tablets (2,000 mg total) by mouth at bedtime. Patient taking differently: Take 1,000 mg by mouth at bedtime. 05/24/22   [place], Raymond J, MD  metoprolol succinate (  TOPROL-XL) 50 MG 24 hr tablet Take 1 tablet (50 mg total) by mouth daily. Take with or immediately following a meal. Patient taking differently: Take 50 mg by mouth daily. 05/24/22   [place], Blondell Reveal, MD  rosuvastatin (CRESTOR) 20 MG tablet Take 1 tablet (20 mg total) by mouth daily. 05/24/22 08/22/22  [place], Raymond J, MD  sertraline (ZOLOFT) 25 MG tablet Take 1 tablet (25 mg total) by mouth daily. Patient not taking: Reported on 06/02/2022 01/18/22   [place], Blondell Reveal, MD    Inpatient Medications: Scheduled Meds:  furosemide  40 mg Intravenous Q12H    insulin aspart  0-20 Units Subcutaneous Q4H   ketorolac  30 mg Intravenous Q6H   pantoprazole (PROTONIX) IV  40 mg Intravenous QHS   potassium chloride  10 mEq Oral BID   Continuous Infusions:  sodium chloride 125 mL/hr at 09/25/22 2256   piperacillin-tazobactam (ZOSYN)  IV Stopped (09/26/22 0915)   PRN Meds: hydrALAZINE, HYDROmorphone (DILAUDID) injection, ketorolac **FOLLOWED BY** [START ON 10/01/2022] ketorolac, ondansetron **OR** ondansetron (ZOFRAN) IV, oxyCODONE-acetaminophen  Allergies:   No Known Allergies  Social History:   Social History   Socioeconomic History   Marital status: Single    Spouse name: Not on file   Number of children: Not on file   Years of education: Not on file   Highest education level: Not on file  Occupational History   Occupation: retail  Tobacco Use   Smoking status: Never   Smokeless tobacco: Never  Vaping Use   Vaping Use: Never used  Substance and Sexual Activity   Alcohol use: Yes    Comment: rarely   Drug use: Never   Sexual activity: Not on file  Other Topics Concern   Not on file  Social History Narrative   Not on file   Social Determinants of Health   Financial Resource Strain: Not on file  Food Insecurity: Not on file  Transportation Needs: Not on file  Physical Activity: Not on file  Stress: Not on file  Social Connections: Not on file  Intimate Partner Violence: Not on file    Family History:    Family History  Problem Relation Age of Onset   Heart failure Mother      ROS:  Please see the history of present illness.   All other ROS reviewed and negative.     Physical Exam/Data:   Vitals:   09/26/22 0548 09/26/22 0700 09/26/22 0800 09/26/22 1000  BP: (!) 129/94  114/88 (!) 135/97  Pulse: 73 72 72 70  Resp: _0 Temp: 98.7 F (37.1 C)   (!) 97.3 F (36.3 C)  TempSrc:    Temporal  SpO2: 95% 97% 97% 100%  Weight:      Height:        Intake/Output Summary (Last 24 hours) at 09/26/2022  1223 Last data filed at 09/26/2022 0501 Gross per 24 hour  Intake 1200 ml  Output --  Net 1200 ml      09/25/2022    2:37 PM 05/25/2022    8:27 PM 05/24/2022    1:22 PM  Last 3 Weights  Weight (lbs) 220 lb 220 lb 10.9 oz 220 lb 9.6 oz  Weight (kg) 99.791 kg 100.1 kg 100.064 kg     Body mass index is 34.46 kg/m.  General:  Well nourished, well developed, in no acute distress HEENT: normal Neck: no JVD Vascular: No carotid bruits; Distal pulses 2+ bilaterally Cardiac:  normal S1, S2; RRR; no murmur  Lungs:  clear to auscultation bilaterally, no wheezing, rhonchi or rales  Abd: soft, nontender, no hepatomegaly  Ext: no edema Musculoskeletal:  No deformities, BUE and BLE strength normal and equal Skin: warm and dry  Neuro:  CNs 2-12 intact, no focal abnormalities noted Psych:  Normal affect   EKG:  The EKG was personally reviewed and demonstrates: Sinus rhythm Telemetry:  Telemetry was personally reviewed and demonstrates: Sinus rhythm  Relevant CV Studies: Echo EF 55 to 60%  Laboratory Data:  High Sensitivity Troponin:   Recent Labs  Lab 09/26/22 0054 09/26/22 0537  TROPONINIHS 45* 34*     Chemistry Recent Labs  Lab 09/25/22 1439 09/26/22 0054  NA 134* 135  K 3.2* 3.2*  CL 103 103  CO2 22 24  GLUCOSE 236* 168*  BUN 26* 25*  CREATININE 1.37* 1.35*  CALCIUM 8.4* 8.2*  GFRNONAA >60 >60  ANIONGAP 9 8    Recent Labs  Lab 09/25/22 1439  PROT 6.5  ALBUMIN 2.4*  AST 17  ALT 27  ALKPHOS 155*  BILITOT 1.6*   Lipids No results for input(s): "CHOL", "TRIG", "HDL", "LABVLDL", "LDLCALC", "CHOLHDL" in the last 168 hours.  Hematology Recent Labs  Lab 09/25/22 1439  WBC 14.1*  RBC 4.16*  HGB 11.9*  HCT 36.5*  MCV 87.7  MCH 28.6  MCHC 32.6  RDW 12.4  PLT 234   Thyroid No results for input(s): "TSH", "FREET4" in the last 168 hours.  BNP Recent Labs  Lab 09/26/22 0054  BNP 211.5*    DDimer No results for input(s): "DDIMER" in the last 168  hours.   Radiology/Studies:  DG Chest Port 1 View  Result Date: 09/25/2022 CLINICAL DATA:  CHF exacerbation, right lower quadrant abdominal pain EXAM: PORTABLE CHEST 1 VIEW COMPARISON:  07/29/2022 FINDINGS: Single frontal view of the chest demonstrates an enlarged cardiac silhouette. Lung volumes are diminished, with increased central vascular congestion. No airspace disease, effusion, or pneumothorax. No acute bony abnormalities. IMPRESSION: 1. Central vascular congestion without overt edema. Electronically Signed   By: Randa Ngo M.D.   On: 09/25/2022 22:13   CT ABDOMEN PELVIS W CONTRAST  Result Date: 09/25/2022 CLINICAL DATA:  Right lower quadrant abdominal pain EXAM: CT ABDOMEN AND PELVIS WITH CONTRAST TECHNIQUE: Multidetector CT imaging of the abdomen and pelvis was performed using the standard protocol following bolus administration of intravenous contrast. RADIATION DOSE REDUCTION: This exam was performed according to the departmental dose-optimization program which includes automated exposure control, adjustment of the mA and/or kV according to patient size and/or use of iterative reconstruction technique. CONTRAST:  11m OMNIPAQUE IOHEXOL 350 MG/ML SOLN COMPARISON:  06/02/2022 FINDINGS: Lower chest: 9 mm subpleural pulmonary nodule within the left lower lobe is stable when accounting for changes in slice selection since remote prior examination of 03/18/2022. Trace interstitial pulmonary edema. Small right pleural effusion has developed. Cardiac size is at the upper limits of normal. Moderate coronary artery calcification noted. Hepatobiliary: Cholelithiasis without pericholecystic inflammatory change. No intra or extrahepatic biliary ductal dilation. Liver unremarkable. Pancreas: Unremarkable Spleen: Unremarkable Adrenals/Urinary Tract: The adrenal glands are unremarkable. The kidneys are normal in size and position. 2-3 mm nonobstructing calculus is seen within the left renal pelvis. No  additional renal or ureteral calculi. Simple cortical cyst noted within the lower pole of the right kidney. No follow-up imaging is recommended for this lesion. The kidneys are otherwise unremarkable. The bladder is unremarkable. Stomach/Bowel: There is extensive periappendiceal inflammatory stranding and  mural thickening involving the appendix. Additionally, there are areas of hypoenhancement involving the mid body of the appendix best seen on coronal image # 43/5 and axial image # 83/2. Together the findings suggest changes of acute gangrenous appendicitis. No evidence of perforation. No extraluminal gas or periappendiceal fluid collections are identified. There is trace free intraperitoneal fluid within the pelvis. Edema tracks within the retroperitoneum into the pelvis bilaterally. The stomach, small bowel, and large bowel are otherwise unremarkable save for mild sigmoid diverticulosis. No free intraperitoneal gas. Vascular/Lymphatic: Aortic atherosclerosis. No enlarged abdominal or pelvic lymph nodes. Reproductive: Mild prostatic hypertrophy. Other: No abdominal wall hernia. Musculoskeletal: No acute bone abnormality. No lytic or blastic bone lesion. IMPRESSION: 1. Acute gangrenous appendicitis. No evidence of perforation. 2. Moderate coronary artery calcification. Interval development of trace interstitial pulmonary edema and small right pleural effusion possibly reflecting changes of mild cardiogenic failure. 3. Cholelithiasis. 4. Minimal left nonobstructing nephrolithiasis. 5. Mild sigmoid diverticulosis. 6. Mild prostatic hypertrophy. 7. Stable 9 mm subpleural pulmonary nodule within the left lower lobe since remote prior examination of 03/18/2022. Non-contrast chest CT at 12-18 months (from today's scan) is considered optional for low-risk patients, but is recommended for high-risk patients. This recommendation follows the consensus statement: Guidelines for Management of Incidental Pulmonary Nodules  Detected on CT Images: From the Fleischner Society 2017; Radiology 2017; 284:228-243. Aortic Atherosclerosis (ICD10-I70.0). Electronically Signed   By: Fidela Salisbury M.D.   On: 09/25/2022 20:28     Assessment and Plan:   Preop evaluation -Denies chest pain or shortness of breath -Recent left heart cath showing nonobstructive disease, EF normal on echo -Patient has stable cardiac disease, no additional cardiac testing or intervention indicated. -Okay to proceed with surgical procedure from a cardiac perspective -Restart chronic cardiac medications including aspirin, statin, Imdur when able.  2.  HFpEF -Trace edema -Agree with IV Lasix -Switch to PTA oral Lasix prior to discharge  3.  Nonobstructive CAD -Minimal troponin elevation represents demand supply mismatch -PTA aspirin, Crestor after procedure. -Continue Imdur, Toprol-XL  Follow-up with primary cardiologist upon discharge as outpatient.  No additional cardiac testing or intervention indicated.  Please let us know if additional cardiac input is needed.  Cardiology will sign off.  Total encounter time more than 85 minutes  Greater than 50% was spent in counseling and coordination of care with the patient    Signed, Marqueze Ramcharan, MD  09/26/2022 12:23 PM

## 2022-09-26 NOTE — Anesthesia Preprocedure Evaluation (Addendum)
Anesthesia Evaluation  Patient identified by MRN, date of birth, ID band Patient awake    Reviewed: Allergy & Precautions, NPO status , Patient's Chart, lab work & pertinent test results  History of Anesthesia Complications Negative for: history of anesthetic complications  Airway Mallampati: IV  TM Distance: >3 FB Neck ROM: full    Dental  (+) Chipped   Pulmonary shortness of breath and at rest, asthma , neg COPD, neg recent URI   Pulmonary exam normal        Cardiovascular hypertension, (-) angina + CAD and +CHF  (-) Past MI and (-) CABG Normal cardiovascular exam     Neuro/Psych  PSYCHIATRIC DISORDERS Anxiety Depression    negative neurological ROS     GI/Hepatic negative GI ROS, Neg liver ROS,,,  Endo/Other  negative endocrine ROSdiabetes    Renal/GU CRFRenal disease     Musculoskeletal   Abdominal   Peds  Hematology negative hematology ROS (+)   Anesthesia Other Findings Past Medical History: No date: Anxiety No date: CHF (congestive heart failure) ([facility]) No date: Depression No date: Diabetes mellitus without complication ([facility]) No date: Hypertension  Past Surgical History: 03/21/2022: LEFT HEART CATH AND CORONARY ANGIOGRAPHY; N/A     Comment:  Procedure: LEFT HEART CATH AND CORONARY ANGIOGRAPHY;                Surgeon: Corky Crafts, MD;  Location: [facility];  Service: Cardiovascular;  Laterality: N/A;  BMI    Body Mass Index: 34.46 kg/m      Reproductive/Obstetrics negative OB ROS                             Anesthesia Physical Anesthesia Plan  ASA: 3 and emergent  Anesthesia Plan: General ETT   Post-op Pain Management:    Induction: Intravenous  PONV Risk Score and Plan: 3 and Ondansetron, Dexamethasone and Midazolam  Airway Management Planned: Oral ETT  Additional Equipment:   Intra-op Plan:   Post-operative Plan: Extubation in  OR  Informed Consent: I have reviewed the patients History and Physical, chart, labs and discussed the procedure including the risks, benefits and alternatives for the proposed anesthesia with the patient or authorized representative who has indicated his/her understanding and acceptance.     Dental Advisory Given  Plan Discussed with: Anesthesiologist, CRNA and Surgeon  Anesthesia Plan Comments: (Patient consented for risks of anesthesia including but not limited to:  - adverse reactions to medications - damage to eyes, teeth, lips or other oral mucosa - nerve damage due to positioning  - sore throat or hoarseness - Damage to heart, brain, nerves, lungs, other parts of body or loss of life  Patient voiced understanding.)        Anesthesia Quick Evaluation

## 2022-09-27 ENCOUNTER — Inpatient Hospital Stay (HOSPITAL_COMMUNITY)
Admit: 2022-09-27 | Discharge: 2022-09-27 | Disposition: A | Discharge status: Home / Self Care | Payer: Self-pay | Attending: Internal Medicine | Admitting: Internal Medicine

## 2022-09-27 ENCOUNTER — Other Ambulatory Visit: Payer: Self-pay

## 2022-09-27 ENCOUNTER — Encounter: Payer: Self-pay | Admitting: Surgery

## 2022-09-27 DIAGNOSIS — I5031 Acute diastolic (congestive) heart failure: Secondary | ICD-10-CM

## 2022-09-27 DIAGNOSIS — N179 Acute kidney failure, unspecified: Secondary | ICD-10-CM

## 2022-09-27 DIAGNOSIS — N189 Chronic kidney disease, unspecified: Secondary | ICD-10-CM

## 2022-09-27 LAB — GLUCOSE, CAPILLARY
Glucose-Capillary: 248 mg/dL — ABNORMAL HIGH (ref 70–99)
Glucose-Capillary: 277 mg/dL — ABNORMAL HIGH (ref 70–99)
Glucose-Capillary: 258 mg/dL — ABNORMAL HIGH (ref 70–99)
Glucose-Capillary: 216 mg/dL — ABNORMAL HIGH (ref 70–99)
Glucose-Capillary: 195 mg/dL — ABNORMAL HIGH (ref 70–99)
Glucose-Capillary: 180 mg/dL — ABNORMAL HIGH (ref 70–99)

## 2022-09-27 LAB — URINE CULTURE: Culture: <10000 — AB

## 2022-09-27 LAB — ECHOCARDIOGRAM COMPLETE
AR max vel: 2.94 cm2
AV Area VTI: 2.68 cm2
AV Area mean vel: 3.03 cm2
AV Mean grad: 3 mmHg
AV Peak grad: 5.4 mmHg
Ao pk vel: 1.16 m/s
Area-P 1/2: 3.85 cm2
Height: 67 in
S' Lateral: 2.9 cm
Weight: 3520 oz

## 2022-09-27 LAB — BASIC METABOLIC PANEL
Anion gap: 9 (ref 5–15)
Anion gap: 10 (ref 5–15)
BUN: 36 mg/dL — ABNORMAL HIGH (ref 6–20)
BUN: 37 mg/dL — ABNORMAL HIGH (ref 6–20)
CO2: 20 mmol/L — ABNORMAL LOW (ref 22–32)
CO2: 19 mmol/L — ABNORMAL LOW (ref 22–32)
Calcium: 8.1 mg/dL — ABNORMAL LOW (ref 8.9–10.3)
Calcium: 8.2 mg/dL — ABNORMAL LOW (ref 8.9–10.3)
Chloride: 107 mmol/L (ref 98–111)
Chloride: 103 mmol/L (ref 98–111)
Creatinine, Ser: 2.06 mg/dL — ABNORMAL HIGH (ref 0.61–1.24)
Creatinine, Ser: 2.51 mg/dL — ABNORMAL HIGH (ref 0.61–1.24)
GFR, Estimated: 39 mL/min — ABNORMAL LOW (ref >60)
GFR, Estimated: 31 mL/min — ABNORMAL LOW (ref >60)
Glucose, Bld: 286 mg/dL — ABNORMAL HIGH (ref 70–99)
Glucose, Bld: 251 mg/dL — ABNORMAL HIGH (ref 70–99)
Potassium: 4 mmol/L (ref 3.5–5.1)
Potassium: 3.7 mmol/L (ref 3.5–5.1)
Sodium: 136 mmol/L (ref 135–145)
Sodium: 132 mmol/L — ABNORMAL LOW (ref 135–145)

## 2022-09-27 LAB — CBC
HCT: 32.9 % — ABNORMAL LOW (ref 39.0–52.0)
Hemoglobin: 10.8 g/dL — ABNORMAL LOW (ref 13.0–17.0)
MCH: 29 pg (ref 26.0–34.0)
MCHC: 32.8 g/dL (ref 30.0–36.0)
MCV: 88.4 fL (ref 80.0–100.0)
Platelets: 230 10*3/uL (ref 150–400)
RBC: 3.72 MIL/uL — ABNORMAL LOW (ref 4.22–5.81)
RDW: 12.4 % (ref 11.5–15.5)
WBC: 12.3 10*3/uL — ABNORMAL HIGH (ref 4.0–10.5)
nRBC: 0 % (ref 0.0–0.2)

## 2022-09-27 LAB — HEMOGLOBIN A1C
Hgb A1c MFr Bld: 8 % — ABNORMAL HIGH (ref 4.8–5.6)
Mean Plasma Glucose: 183 mg/dL

## 2022-09-27 MED ORDER — INSULIN GLARGINE-YFGN 100 UNIT/ML ~~LOC~~ SOLN
13.0000 [IU] | Freq: Every day | SUBCUTANEOUS | Status: DC
  Administered 2022-09-27: 13 [IU] via SUBCUTANEOUS
  Filled 2022-09-27 (×2): qty 0.13

## 2022-09-27 MED ORDER — AMLODIPINE BESYLATE 5 MG PO TABS
5.0000 mg | ORAL_TABLET | Freq: Every day | ORAL | Status: DC
  Administered 2022-09-27: 5 mg via ORAL
  Filled 2022-09-27: qty 1

## 2022-09-27 MED ORDER — ROSUVASTATIN CALCIUM 10 MG PO TABS
20.0000 mg | ORAL_TABLET | Freq: Every day | ORAL | Status: DC
  Administered 2022-09-27 – 2022-09-30 (×4): 20 mg via ORAL
  Filled 2022-09-27 (×4): qty 2

## 2022-09-27 MED ORDER — ALBUMIN HUMAN 25 % IV SOLN
25.0000 g | Freq: Once | INTRAVENOUS | Status: AC
  Administered 2022-09-27: 25 g via INTRAVENOUS
  Filled 2022-09-27: qty 100

## 2022-09-27 MED ORDER — ISOSORBIDE MONONITRATE ER 30 MG PO TB24
15.0000 mg | ORAL_TABLET | Freq: Every day | ORAL | Status: DC
  Administered 2022-09-27 – 2022-09-30 (×4): 15 mg via ORAL
  Filled 2022-09-27 (×4): qty 1

## 2022-09-27 MED ORDER — SODIUM CHLORIDE 0.9 % IV SOLN: INTRAVENOUS | Status: DC

## 2022-09-27 MED ORDER — SODIUM CHLORIDE 0.9 % IV BOLUS
500.0000 mL | Freq: Once | INTRAVENOUS | Status: AC
  Administered 2022-09-27: 500 mL via INTRAVENOUS

## 2022-09-27 MED ORDER — AMOXICILLIN-POT CLAVULANATE 875-125 MG PO TABS
1.0000 | ORAL_TABLET | Freq: Two times a day (BID) | ORAL | 0 refills | Status: DC
  Filled 2022-09-30: qty 20, 10d supply, fill #0

## 2022-09-27 MED ORDER — HYDROCODONE-ACETAMINOPHEN 5-325 MG PO TABS: 1.0000 | ORAL_TABLET | ORAL | 0 refills | Status: DC | PRN

## 2022-09-27 MED ORDER — BUSPIRONE HCL 15 MG PO TABS
7.5000 mg | ORAL_TABLET | Freq: Two times a day (BID) | ORAL | Status: DC
  Administered 2022-09-27 – 2022-09-30 (×7): 7.5 mg via ORAL
  Filled 2022-09-27 (×8): qty 1

## 2022-09-27 MED ORDER — METOPROLOL SUCCINATE ER 50 MG PO TB24
50.0000 mg | ORAL_TABLET | Freq: Every day | ORAL | Status: DC
  Administered 2022-09-27 – 2022-09-30 (×4): 50 mg via ORAL
  Filled 2022-09-27 (×4): qty 1

## 2022-09-27 NOTE — Progress Notes (Signed)
Postoperative day #1 from lap appendectomy.  Vital signs stable.  Creatinine bump to 2.   I was going  to discharge him and we noticed that his repeat creatinine this afternoon went the wrong way to 2.5.  I am holding his discharge for now. I have d/W Dr Renae Gloss we will keep him on IV fluids and will recheck creatinine tomorrow.  Clinically he is doing well Abd: soft, incisions c.d.I and drain serous output  From a kidney perspective his acute kidney injury got worse today.  Will keep an eye on him. No evidence of surgical complications at this time

## 2022-09-27 NOTE — Assessment & Plan Note (Addendum)
Acute kidney injury on CKD stage II.  Creatinine went from 1.35 up to 2.06 on the morning of 12/26.  Patient was given a fluid bolus and creatinine then went up to 2.51 in the afternoon on 12/26.  Will give IV fluid overnight and recheck creatinine tomorrow. 12/27: Worsening creatinine and lower extremity edema.  Echocardiogram with normal EF and grade 2 diastolic dysfunction and a dilated IVC.  Renal ultrasound without any significant abnormality.  Slight improvement in renal function today-concern of contrast related nephropathy. Nephrology would like to keep holding diuretics Renal functions continue to improve, currently creatinine at 2.15. Patient can resume home Lasix on discharge per nephrology -Monitor renal function -Avoid nephrotoxins

## 2022-09-27 NOTE — Inpatient Diabetes Management (Signed)
Inpatient Diabetes Program Recommendations  AACE/ADA: New Consensus Statement on Inpatient Glycemic Control (2015)  Target Ranges:  Prepandial:   less than 140 mg/dL      Peak postprandial:   less than 180 mg/dL (1-2 hours)      Critically ill patients:  140 - 180 mg/dL    Latest Reference Range & Units 12/07/21 16:12 03/18/22 04:34 09/26/22 00:54  Hemoglobin A1C 4.8 - 5.6 % 10.7 (H) 9.8 (H) 8.0 (H)  (H): Data is abnormally high  Latest Reference Range & Units 09/26/22 00:31 09/26/22 03:39 09/26/22 08:00 09/26/22 14:42 09/26/22 19:36 09/26/22 23:45 09/27/22 05:01  Glucose-Capillary 70 - 99 mg/dL 865 (H)  4 units Novolog  151 (H)  4 units Novolog  155 (H)  4 units Novolog @1021  142 (H)    5 mg Decadron @1303  217 (H)  7  units Novolog @2212  312 (H)  15 units Novolog  248 (H)  7 units Novolog   (H): Data is abnormally high   Admit with:  Acute appendicitis with peritoneal abscess   History: DM, CKD  Home DM Meds: Lantus 26 units Daily        Jardiance 10 mg daily        Metformin 1000 mg QHS  Current Orders: Novolog Resistant Correction Scale/ SSI (0-20 units) Q4 hours     MD- Note pt got Decadron X 1 dose yest at 1pm for Surgery  CBGs elevated this AM--Takes Lantus insulin at home  Please consider starting 50% home dose Lantus this AM: Lantus 13 units Daily--Please start this AM    --Will follow patient during hospitalization--  RN, MSN, CDCES Diabetes Coordinator Inpatient Glycemic Control Team Team Pager: [phone] (8a-5p)

## 2022-09-27 NOTE — Discharge Instructions (Signed)
Laparoscopic Appendectomy, Adult  A laparoscopic appendectomy is a surgery to take out the appendix. The appendix is a finger-like structure that is attached to the large intestine. This procedure may be done to prevent an inflamed appendix from bursting (rupturing). It may also be done to treat the infection from an appendix that has ruptured. It is often done right after appendicitis is diagnosed. Appendicitis is inflammation of the appendix. In this surgery, your health care provider uses a thin, lighted tube with a camera (laparoscope) to take out the appendix through three small incisions. This is a minimally invasive surgery. It usually results in less pain, fewer problems, and a quicker recovery than surgery done through a large incision (open appendectomy). Tell a health care provider about: Any allergies you have. All medicines you are taking, including vitamins, herbs, eye drops, creams, and over-the-counter medicines. Any steroid use. This includes creams or steroids you take by mouth. Any problems you or family members have had with anesthetic medicines. Any bleeding problems you have. Any surgeries you have had. Any medical conditions you have. Whether you are pregnant or may be pregnant. What are the risks? Generally, this is a safe procedure. However, problems may occur, including: Infection. Bleeding. Damage to nearby structures or organs. Allergic reactions to medicines. A collection of pus (abscess). Blood clots in the legs. What happens before the procedure? When to stop eating and drinking Follow instructions from your health care provider about eating and drinking restrictions. You may be asked not to eat or drink as soon as the diagnosis of appendicitis is made. Medicines Ask your health care provider about: Changing or stopping your regular medicines. This is especially important if you are taking diabetes medicines or blood thinners. Taking medicines such as aspirin  and ibuprofen. These medicines can thin your blood. Do not take these medicines unless your health care provider tells you to take them. Taking over-the-counter medicines, vitamins, herbs, and supplements. General instructions If you will be going home right after the procedure, plan to have a responsible adult: Take you home from the hospital or clinic. You will not be allowed to drive. Care for you for the time you are told. Ask your health care provider: How your surgery site will be marked. What steps will be taken to help prevent infection. These steps may include: Removing hair at the surgery site. Washing skin with a germ-killing soap. Taking antibiotic medicine. What happens during the procedure?  An IV will be inserted into one of your veins. You will be given one or more of the following: A medicine to help you relax (sedative). A medicine to numb the area (local anesthetic). A medicine to make you fall asleep (general anesthetic). A thin, flexible tube (catheter) may be put into your bladder to drain urine. A tube may be passed through your nose or mouth and into your stomach (orogastric or nasogastric tube) to drain any stomach contents. Your surgeon will make three small incisions near your belly button (navel). A gas (carbon dioxide) will be used to fill your abdomen. The gas will make your abdomen expand. This helps the surgeon see clearly and gives him or her more room to work. A laparoscope will be passed through one of the incisions. Other surgical instruments will be passed through the other incisions to assist in surgery. The appendix will be located and removed through one of the incisions. The abdomen may be washed out to remove bacteria. The incisions will be closed with stitches (sutures),  staples, or adhesive strips. A bandage (dressing) may be used to cover the incisions. If a tube was inserted into your bladder or stomach, it will be removed. The procedure may  vary among health care providers and hospitals. What happens after the procedure? Your blood pressure, heart rate, breathing rate, and blood oxygen level will be monitored until you leave the hospital or clinic. You will be given medicines as needed to control pain and infection. If you were given a sedative during the procedure, it can affect you for several hours. Do not drive or operate machinery until your health care provider says that it is safe. If your appendix did not rupture, you may be able to go home the same day after your surgery. If your appendix ruptured: You will get antibiotic medicine through an IV line. You may be sent home with a temporary drain. Summary A laparoscopic appendectomy is a surgery to take out the appendix. The appendix is removed through three small incisions with the help of a thin, lighted tube that has a camera (laparoscope). This is a safe procedure, but there are some risks. Risks include bleeding, infection, allergic reaction to medicines, or damage to nearby organs. After the procedure, your blood pressure, heart rate, breathing rate, and blood oxygen level will be monitored until you leave the hospital or clinic. You will be given medicines as needed to control pain and infection. This information is not intended to replace advice given to you by your health care provider. Make sure you discuss any questions you have with your health care provider. Document Revised: 07/01/2021 Document Reviewed: 07/01/2021

## 2022-09-27 NOTE — Progress Notes (Signed)
*  PRELIMINARY RESULTS* Echocardiogram 2D Echocardiogram has been performed.  Leroy Simmons 09/27/2022, 11:19 AM

## 2022-09-27 NOTE — Plan of Care (Signed)
  Problem: Education: Goal: Ability to describe self-care measures that may prevent or decrease complications (Diabetes Survival Skills Education) will improve Outcome: Progressing   Problem: Coping: Goal: Ability to adjust to condition or change in health will improve Outcome: Progressing   Problem: Health Behavior/Discharge Planning: Goal: Ability to identify and utilize available resources and services will improve Outcome: Progressing   Problem: Nutritional: Goal: Maintenance of adequate nutrition will improve Outcome: Progressing   Problem: Skin Integrity: Goal: Risk for impaired skin integrity will decrease Outcome: Progressing   

## 2022-09-27 NOTE — Progress Notes (Signed)
Progress Note   Patient: Leroy Simmons [id number] DOB: 1970-10-13 DOA: 09/25/2022     2 DOS: the patient was seen and examined on 09/27/2022   Brief hospital course: 51 y.o. male with past medical history of anxiety, depression, HTN, CKD 2 diabetes mellitus, chronic diastolic heart failure (EF 50 to 55% 03/2022), CAD s/p cardiac cath 03/21/2022 that showed nonobstructive CAD, hospitalized for CHF exacerbation in June 2023, with last cardiology follow-up 6/27, who is being seen in consultation for management of medical problems after being admitted to the surgical service with acute appendicitis.  He presented to the ED with a 3-day history of RLQ pain, loss of appetite and diarrhea, with CT abdomen and pelvis showing acute gangrenous appendicitis without perforation as well as concerns for "Interval development of trace interstitial pulmonary edema and small right pleural effusion possibly reflecting changes of mild cardiogenic failure". Patient has had no recent chest pain, shortness of breath, weight gain lower extremity edema, orthopnea.  Denies palpitations and lightheadedness. On arrival in the ED BP was 181/105 and he was tachycardic to 101 with otherwise normal vitals.  CBC showed WBC 14,000, hemoglobin 11.9.  Troponin and BNP not done.  Other pertinent findings included glucose of 236, creatinine At baseline at 1.37, potassium 3.2.  Urinalysis showed many bacteria.  Lipase normal transaminases WNL, mild bilirubin elevation to 1.6. EKG, not yet done. Patient was treated with IV hydralazine for BP in the ED and was admitted to the surgical service.  Hospitalist consulted by primary surgical service.  12/26.  Patient feeling better and eating.  Creatinine this morning went up to 2.06.  A fluid bolus was given and creatinine was checked this afternoon and up to 2.51.  Will start IV fluids.  Cancel discharge and spoke with general surgery.  Assessment and Plan: * AKI (acute kidney injury)  ([place]) Acute kidney injury on CKD stage II.  Creatinine went from 1.35 up to 2.06 on the morning of 12/26.  Patient was given a fluid bolus and creatinine then went up to 2.51 in the afternoon on 12/26.  Will give IV fluid overnight and recheck creatinine tomorrow.  Acute gangrenous appendicitis Admitted to the surgical service by Dr. Christian Mate and taken to the [place] on 12/25. Currently on IV Zosyn.  Hypertensive urgency Blood pressure initially elevated secondary to pain.  Continue Toprol and Imdur.   Chronic diastolic CHF (congestive heart failure) (HCC) Holding Lasix.  Give IV fluids secondary to acute kidney injury.  EF 55%.  Uncontrolled type 2 diabetes mellitus with hyperglycemia, with long-term current use of insulin (HCC) Last hemoglobin A1c 8.0.  Patient currently on sliding scale insulin.  Restarted Semglee insulin.  Obesity (BMI 30-39.9) BMI 34.46  Pulmonary nodule Continue outpatient surveillance  Coronary artery disease involving native coronary artery of native heart without angina pectoris s/p cardiac cath 03/21/2022 that showed nonobstructive CAD Patient has no complaints of chest pain   Anxiety and depression Restart BuSpar  Asthma Not acutely exacerbated DuoNebs as needed  HYPOKALEMIA Replaced.  Since holding Lasix I will hold off on further potassium supplementation.        Subjective: Patient seen this morning and was sitting up and eating he was feeling okay.  His creatinine went up to 2.06 this morning.  I put in an order to hold Lasix and give a fluid bolus.  Admitted with gangrenous appendicitis.  Physical Exam: Vitals:   09/26/22 1843 09/26/22 1941 09/27/22 0500 09/27/22 0839  BP: 139/86 (!) 161/96 131/87 Marland Kitchen)  140/91  Pulse: 86 91 74 79  Resp: 16 20 20 16   Temp: 98.5 F (36.9 C) 98.4 F (36.9 C) 97.8 F (36.6 C) 98.1 F (36.7 C)  TempSrc: Oral Oral Oral Oral  SpO2: 98% 99% 100% 98%  Weight:      Height:       Physical Exam HENT:      Head: Normocephalic.     Mouth/Throat:     Pharynx: No oropharyngeal exudate.  Eyes:     General: Lids are normal.     Conjunctiva/sclera: Conjunctivae normal.  Cardiovascular:     Rate and Rhythm: Normal rate and regular rhythm.     Heart sounds: Normal heart sounds, S1 normal and S2 normal.  Pulmonary:     Breath sounds: Examination of the right-lower field reveals decreased breath sounds. Examination of the left-lower field reveals decreased breath sounds. Decreased breath sounds present. No wheezing, rhonchi or rales.  Abdominal:     Palpations: Abdomen is soft.     Tenderness: There is abdominal tenderness in the right lower quadrant.  Musculoskeletal:     Right lower leg: Swelling present.     Left lower leg: Swelling present.  Skin:    General: Skin is warm.     Findings: No rash.  Neurological:     Mental Status: He is alert and oriented to person, place, and time.     Data Reviewed: Creatinine this morning 2.06.  Creatinine this afternoon up to 2.51.  Family Communication: As per general surgery  Disposition: Status is: Inpatient Remains inpatient appropriate because: Creatinine increased from 1.35 yesterday to 2.06 and this afternoon up to 2.51.  Giving IV fluids.  Recheck creatinine tomorrow.  Planned Discharge Destination: Home    Time spent: 29 minutes Case discussed with Dr.  Author: Everlene Farrier, MD 09/27/2022 2:11 PM  For on call review [email or web address].

## 2022-09-27 NOTE — TOC Initial Note (Signed)
Transition of Care [facility]) - Initial/Assessment Note    Patient Details  Name: Leroy Simmons MRN: [id number] Date of Birth: 12-29-1970  Transition of Care [facility]) CM/SW Contact:    Allena Katz, LCSW Phone Number: 09/27/2022, 12:38 PM  Clinical Narrative:   Spoke with pt for readmission screen. Pt reports that he has a PCP Dr. Ceasar Mons [place]. Pt states he normally get his medications from the [facility] pharmacy but states that he needs a new pharmacy as he is uninsured. Pt informed about [facility] pharmacy. Pt would like his medications sent to this pharmacy at discharge. Pt reports no additional concerns at this time.               Expected Discharge Plan: Home/Self Care Barriers to Discharge: Barriers Resolved   Patient Goals and CMS Choice Patient states their goals for this hospitalization and ongoing recovery are:: return home          Expected Discharge Plan and Services         Expected Discharge Date: 09/27/22                                    Prior Living Arrangements/Services     Patient language and need for interpreter reviewed:: Yes Do you feel safe going back to the place where you live?: Yes               Activities of Daily Living Home Assistive Devices/Equipment: None ADL Screening (condition at time of admission) Patient's cognitive ability adequate to safely complete daily activities?: Yes Is the patient deaf or have difficulty hearing?: No Does the patient have difficulty seeing, even when wearing glasses/contacts?: No Does the patient have difficulty concentrating, remembering, or making decisions?: No Patient able to express need for assistance with ADLs?: No Does the patient have difficulty dressing or bathing?: No Independently performs ADLs?: Yes (appropriate for developmental age) Does the patient have difficulty walking or climbing stairs?: No Weakness of Legs: None Weakness of Arms/Hands: None  Permission Sought/Granted                   Emotional Assessment       Orientation: : Oriented to Self, Oriented to  Time, Oriented to Place, Oriented to Situation      Admission diagnosis:  Acute appendicitis [K35.80] Acute appendicitis with localized peritonitis and gangrene, without perforation or abscess [K35.31] Patient Active Problem List   Diagnosis Date Noted   Obesity (BMI 30-39.9) 09/26/2022   Pre-operative cardiovascular examination 09/26/2022   Heart failure with preserved ejection fraction (HCC) 09/26/2022   Acute gangrenous appendicitis 09/25/2022   Hypertensive urgency 09/25/2022   Uncontrolled type 2 diabetes mellitus with hyperglycemia, with long-term current use of insulin (HCC) 09/25/2022   CKD (chronic kidney disease) stage 2, GFR 60-89 ml/min 09/25/2022   Chronic diastolic CHF (congestive heart failure) (HCC) 06/02/2022   Pulmonary nodule 06/02/2022   AKI (acute kidney injury) (HCC) 03/18/2022   Coronary artery disease involving native coronary artery of native heart without angina pectoris 03/18/2022   Acute exacerbation of CHF (congestive heart failure) (HCC) 03/18/2022   Cellulitis of right lower extremity    CHF exacerbation (HCC) 03/17/2022   Anxiety and depression 03/10/2022   Cardiomegaly 01/18/2022   Diabetes mellitus (HCC) 12/07/2021   Hyperlipidemia 12/07/2021   Pain of both shoulder joints 10/22/2021   Cellulitis of back except buttock 10/01/2020   Asthma 10/26/2019  Cellulitis of left heel 10/26/2019   Chest pain 10/26/2019   Left foot pain 10/26/2019   Cellulitis and abscess of neck 07/13/2019   Abscess of back, except buttock 05/30/2019   HYPOKALEMIA 09/07/2009   MORBID OBESITY 09/07/2009   Anxiety 09/07/2009   Depression 09/07/2009   Essential hypertension 09/07/2009   ASTHMA 09/07/2009   DIARRHEA, CHRONIC 09/07/2009   PCP:  de [place], Raymond J, MD Pharmacy:   [facility] [place], [place] [address] Phone: [phone] Fax: [phone]  [place], [place] [address] Phone: [phone] Fax: [phone]  [place] [address], [place] - [id number] [place] [place] [address] Phone: [phone] Fax: [phone]     Social Determinants of Health (SDOH) Social History: SDOH Screenings   Depression (PHQ2-9): Low Risk  (05/24/2022)  Recent Concern: Depression (PHQ2-9) - High Risk (03/10/2022)  Tobacco Use: Low Risk  (09/27/2022)   SDOH Interventions:     Readmission Risk Interventions    09/27/2022   12:36 PM  Readmission Risk Prevention Plan  Transportation Screening Complete  PCP or Specialist Appt within 3-5 Days Complete  Palliative Care Screening Not Applicable  Medication Review (RN Care Manager) Complete

## 2022-09-27 NOTE — Progress Notes (Signed)
Patient was instructed to provide printed prescriptions to [facility] outpatient pharmacy and notified outpatient pharmacy that he would need to be enrolled as a medication management patient

## 2022-09-28 ENCOUNTER — Inpatient Hospital Stay: Payer: Self-pay

## 2022-09-28 DIAGNOSIS — K3533 Acute appendicitis with perforation and localized peritonitis, with abscess: Secondary | ICD-10-CM

## 2022-09-28 LAB — GLUCOSE, CAPILLARY
Glucose-Capillary: 173 mg/dL — ABNORMAL HIGH (ref 70–99)
Glucose-Capillary: 175 mg/dL — ABNORMAL HIGH (ref 70–99)
Glucose-Capillary: 185 mg/dL — ABNORMAL HIGH (ref 70–99)
Glucose-Capillary: 188 mg/dL — ABNORMAL HIGH (ref 70–99)
Glucose-Capillary: 188 mg/dL — ABNORMAL HIGH (ref 70–99)
Glucose-Capillary: 195 mg/dL — ABNORMAL HIGH (ref 70–99)

## 2022-09-28 LAB — SURGICAL PATHOLOGY

## 2022-09-28 LAB — BASIC METABOLIC PANEL
Anion gap: 8 (ref 5–15)
BUN: 44 mg/dL — ABNORMAL HIGH (ref 6–20)
CO2: 20 mmol/L — ABNORMAL LOW (ref 22–32)
Calcium: 8.3 mg/dL — ABNORMAL LOW (ref 8.9–10.3)
Chloride: 110 mmol/L (ref 98–111)
Creatinine, Ser: 3.02 mg/dL — ABNORMAL HIGH (ref 0.61–1.24)
GFR, Estimated: 24 mL/min — ABNORMAL LOW (ref >60)
Glucose, Bld: 204 mg/dL — ABNORMAL HIGH (ref 70–99)
Potassium: 3.6 mmol/L (ref 3.5–5.1)
Sodium: 138 mmol/L (ref 135–145)

## 2022-09-28 LAB — BRAIN NATRIURETIC PEPTIDE: B Natriuretic Peptide: 178.6 pg/mL — ABNORMAL HIGH (ref 0.0–100.0)

## 2022-09-28 MED ORDER — CIPROFLOXACIN IN D5W 400 MG/200ML IV SOLN
400.0000 mg | Freq: Two times a day (BID) | INTRAVENOUS | Status: DC
  Administered 2022-09-28 – 2022-09-30 (×5): 400 mg via INTRAVENOUS
  Filled 2022-09-28 (×5): qty 200

## 2022-09-28 MED ORDER — METRONIDAZOLE 500 MG/100ML IV SOLN
500.0000 mg | Freq: Two times a day (BID) | INTRAVENOUS | Status: DC
  Administered 2022-09-28 – 2022-09-30 (×5): 500 mg via INTRAVENOUS
  Filled 2022-09-28 (×5): qty 100

## 2022-09-28 MED ORDER — INSULIN GLARGINE-YFGN 100 UNIT/ML ~~LOC~~ SOLN
10.0000 [IU] | Freq: Two times a day (BID) | SUBCUTANEOUS | Status: DC
  Administered 2022-09-28 – 2022-09-30 (×5): 10 [IU] via SUBCUTANEOUS
  Filled 2022-09-28 (×6): qty 0.1

## 2022-09-28 NOTE — Evaluation (Signed)
Physical Therapy Evaluation Patient Details Name: Leroy Simmons MRN: [id number] DOB: 05/19/1971 Today's Date: 09/28/2022  History of Present Illness  51 y/o male presented to ED on 09/25/22 for RLQ x 3 days with associated nausea, chills, and loss of appetite. CT showed acute gangrenous appendicitis. S/p laparoscopic appendectomy on 12/25. PMH: CHF, anxiety, depression, T2DM, HTN  Clinical Impression  Patient admitted with the above. PTA, patient lives with friend/coworker and reports independence with no AD for mobility. Currently, patient presents with weakness, impaired balance, and decreased activity tolerance. Patient functioning at supervision level with RW. Instructed patient on exhaling prior to transitional movements to reduce abdominal pressure, patient demonstrated understanding. Patient will benefit from skilled PT services during acute stay to address listed deficits. No PT follow up recommended at this time.      Recommendations for follow up therapy are one component of a multi-disciplinary discharge planning process, led by the attending physician.  Recommendations may be updated based on patient status, additional functional criteria and insurance authorization.  Follow Up Recommendations No PT follow up      Assistance Recommended at Discharge PRN  Patient can return home with the following       Equipment Recommendations Rolling [name] (2 wheels)  Recommendations for Other Services       Functional Status Assessment Patient has had a recent decline in their functional status and demonstrates the ability to make significant improvements in function in a reasonable and predictable amount of time.     Precautions / Restrictions Precautions Precautions: Fall Restrictions Weight Bearing Restrictions: No      Mobility  Bed Mobility               General bed mobility comments: sitting EOB on arrival    Transfers Overall transfer level: Needs  assistance Equipment used: Rolling [name] (2 wheels) Transfers: Sit to/from Stand Sit to Stand: Supervision           General transfer comment: cues for exhaling prior to standing to reduce abdominal pressure. Supervision for safety    Ambulation/Gait Ambulation/Gait assistance: Supervision Gait Distance (Feet): 180 Feet Assistive device: Rolling [name] (2 wheels) Gait Pattern/deviations: Step-through pattern, Decreased stride length Gait velocity: decreased     General Gait Details: supervision for safety. Cues for close RW proximity  Stairs            Wheelchair Mobility    Modified Rankin (Stroke Patients Only)       Balance Overall balance assessment: Mild deficits observed, not formally tested                                           Pertinent Vitals/Pain Pain Assessment Pain Assessment: Faces Faces Pain Scale: Hurts even more Pain Location: abdomen Pain Descriptors / Indicators: Grimacing, Guarding Pain Intervention(s): Monitored during session, Repositioned    Home Living Family/patient expects to be discharged to:: Private residence Living Arrangements: Non-relatives/Friends Available Help at Discharge: Friend(s) Type of Home: House Home Access: Stairs to enter Entrance Stairs-Rails:  (house on one side that he leans on) Entrance Stairs-Number of Steps: 5 Alternate Level Stairs-Number of Steps: 1-2 (from different levels) Home Layout: Multi-level (split level) Home Equipment: None      Prior Function Prior Level of Function : Independent/Modified Independent;Working/employed  Hand Dominance        Extremity/Trunk Assessment   Upper Extremity Assessment Upper Extremity Assessment: Overall WFL for tasks assessed    Lower Extremity Assessment Lower Extremity Assessment: Generalized weakness    Cervical / Trunk Assessment Cervical / Trunk Assessment: Normal;Other exceptions Cervical /  Trunk Exceptions: abdominal sx  Communication   Communication: No difficulties  Cognition Arousal/Alertness: Awake/alert Behavior During Therapy: WFL for tasks assessed/performed Overall Cognitive Status: Within Functional Limits for tasks assessed                                          General Comments      Exercises     Assessment/Plan    PT Assessment Patient needs continued PT services  PT Problem List Decreased strength;Decreased activity tolerance;Decreased balance;Decreased mobility;Decreased knowledge of use of DME       PT Treatment Interventions DME instruction;Gait training;Functional mobility training;Stair training;Therapeutic activities;Therapeutic exercise;Balance training;Patient/family education    PT Goals (Current goals can be found in the Care Plan section)  Acute Rehab PT Goals Patient Stated Goal: to reduce pain PT Goal Formulation: With patient Time For Goal Achievement: 10/12/22 Potential to Achieve Goals: Good    Frequency Min 2X/week     Co-evaluation               AM-PAC PT "6 Clicks" Mobility  Outcome Measure Help needed turning from your back to your side while in a flat bed without using bedrails?: A Little Help needed moving from lying on your back to sitting on the side of a flat bed without using bedrails?: A Little Help needed moving to and from a bed to a chair (including a wheelchair)?: A Little Help needed standing up from a chair using your arms (e.g., wheelchair or bedside chair)?: A Little Help needed to walk in hospital room?: A Little Help needed climbing 3-5 steps with a railing? : A Little 6 Click Score: 18    End of Session   Activity Tolerance: Patient tolerated treatment well Patient left: in chair;with call bell/phone within reach Nurse Communication: Mobility status PT Visit Diagnosis: Unsteadiness on feet (R26.81);Muscle weakness (generalized) (M62.81)    Time: 1040-1105 PT Time  Calculation (min) (ACUTE ONLY): 25 min   Charges:   PT Evaluation $PT Eval Low Complexity: 1 Low PT Treatments $Therapeutic Activity: 8-22 mins        [name] A. Dan Humphreys PT, DPT [facility] - Acute Rehabilitation Services   [name] A [name] 09/28/2022, 11:15 AM

## 2022-09-28 NOTE — Progress Notes (Signed)
Progress Note   Patient: Leroy Simmons ZDG:[id number] DOB: August 10, 1971 DOA: 09/25/2022     3 DOS: the patient was seen and examined on 09/28/2022   Brief hospital course: 51 y.o. male with past medical history of anxiety, depression, HTN, CKD 2 diabetes mellitus, chronic diastolic heart failure (EF 50 to 55% 03/2022), CAD s/p cardiac cath 03/21/2022 that showed nonobstructive CAD, hospitalized for CHF exacerbation in June 2023, with last cardiology follow-up 6/27, who is being seen in consultation for management of medical problems after being admitted to the surgical service with acute appendicitis.  He presented to the ED with a 3-day history of RLQ pain, loss of appetite and diarrhea, with CT abdomen and pelvis showing acute gangrenous appendicitis without perforation as well as concerns for "Interval development of trace interstitial pulmonary edema and small right pleural effusion possibly reflecting changes of mild cardiogenic failure". Patient has had no recent chest pain, shortness of breath, weight gain lower extremity edema, orthopnea.  Denies palpitations and lightheadedness. On arrival in the ED BP was 181/105 and he was tachycardic to 101 with otherwise normal vitals.  CBC showed WBC 14,000, hemoglobin 11.9.  Troponin and BNP not done.  Other pertinent findings included glucose of 236, creatinine At baseline at 1.37, potassium 3.2.  Urinalysis showed many bacteria.  Lipase normal transaminases WNL, mild bilirubin elevation to 1.6. EKG, not yet done. Patient was treated with IV hydralazine for BP in the ED and was admitted to the surgical service.  Hospitalist consulted by primary surgical service.  12/26.  Patient feeling better and eating.  Creatinine this morning went up to 2.06.  A fluid bolus was given and creatinine was checked this afternoon and up to 2.51.  Will start IV fluids.  Cancel discharge and spoke with general surgery. 12/27: Creatinine continue to trend up despite getting IV  fluid, at 3.02.  Patient did receive contrast on 12/24.  Ordered renal ultrasound and nephrology consult.  Per nephrology note patient has CKD stage II with baseline creatinine around 1.35.  Significant lower extremity edema so IV fluids were discontinued.  Echocardiogram done yesterday with normal EF, grade 2 diastolic dysfunction and dilated IVC.  I will let nephrology decide about diuresis.  Renal ultrasound was normal.  Assessment and Plan: * Acute kidney injury superimposed on CKD (HCC) Acute kidney injury on CKD stage II.  Creatinine went from 1.35 up to 2.06 on the morning of 12/26.  Patient was given a fluid bolus and creatinine then went up to 2.51 in the afternoon on 12/26.  Will give IV fluid overnight and recheck creatinine tomorrow. 12/27: Worsening creatinine and lower extremity edema.  Echocardiogram with normal EF and grade 2 diastolic dysfunction and a dilated IVC.  Renal ultrasound without any significant abnormality.  Discontinuing IV fluid and nephrology was consulted. -Will let nephrology decide about diuresing -Monitor renal function -Avoid nephrotoxins  Acute gangrenous appendicitis Admitted to the surgical service by Dr. Claudine Mouton and taken to the OR on 12/25. Currently on IV Zosyn.  Hypertensive urgency Blood pressure now within goal. Blood pressure initially elevated secondary to pain.  Continue Toprol and Imdur.   Chronic diastolic CHF (congestive heart failure) (HCC) Holding Lasix.  Give IV fluids secondary to acute kidney injury.  EF 55%.  Grade 2 diastolic dysfunction and dilated IVC. -Will let nephrology decide about Lasix  Uncontrolled type 2 diabetes mellitus with hyperglycemia, with long-term current use of insulin (HCC) Last hemoglobin A1c 8.0.  CBG currently elevated -Increasing the Semglee to  twice daily -Continue SSI  Obesity (BMI 30-39.9) BMI 34.46  Pulmonary nodule Continue outpatient surveillance  Coronary artery disease involving native  coronary artery of native heart without angina pectoris s/p cardiac cath 03/21/2022 that showed nonobstructive CAD Patient has no complaints of chest pain   Anxiety and depression Restart BuSpar  Asthma Not acutely exacerbated DuoNebs as needed  HYPOKALEMIA Replaced.  Since holding Lasix I will hold off on further potassium supplementation.  Subjective: Patient was sitting in chair when seen today.  Continued to have some abdominal pain.  Physical Exam: Vitals:   09/27/22 1548 09/27/22 1909 09/28/22 0408 09/28/22 0902  BP: 127/88 103/71 108/64 124/76  Pulse: 75 73 81 74  Resp: 18 18 18 16   Temp: 98.1 F (36.7 C) 97.9 F (36.6 C) 98.2 F (36.8 C) 98.4 F (36.9 C)  TempSrc: Oral Oral Oral Oral  SpO2: 95% 99% 93% 100%  Weight:   101.6 kg   Height:       General.  Obese gentleman, in no acute distress. Pulmonary.  Lungs clear bilaterally, normal respiratory effort.  Decreased breath sound at bases CV.  Regular rate and rhythm, no JVD, rub or murmur. Abdomen.  Soft, nontender, nondistended, BS positive. CNS.  Alert and oriented .  No focal neurologic deficit. Extremities.  2+ LE edema, no cyanosis, pulses intact and symmetrical. Psychiatry.  Judgment and insight appears normal.    Data Reviewed: Prior data reviewed  Family Communication: As per general surgery  Disposition: Status is: Inpatient Remains inpatient appropriate because: Creatinine increased from 1.35 yesterday to 2.06 and this afternoon up to 2.51>>3.05 .  Recheck creatinine tomorrow.  Planned Discharge Destination: Home  Time spent: 40 minutes  This record has been created using Systems analyst. Errors have been sought and corrected,but may not always be located. Such creation errors do not reflect on the standard of care.   Author: Lorella Nimrod, MD 09/28/2022 2:30 PM  For on call review [email or web address]

## 2022-09-28 NOTE — Consult Note (Signed)
[facility] Consult Note:    Date of Admission:  09/25/2022           Reason for Consult:     Referring Provider: Campbell Lerner, MD Primary Care Provider: de [place], Buren Kos, MD   History of Presenting Illness:  Leroy Simmons is a 51 y.o. male who is being seen for acute kidney injury. Patient presented to the emergency room on 09/25/2022 for right lower quadrant pain 3 days prior to admission with nausea, chills and loss of appetite along with diarrhea.  Upon arrival he was noted to have severely elevated blood pressure of 180/103. He underwent CT with IV contrast and was diagnosed with gangrenous appendicitis. He underwent laparoscopic appendectomy on 09/26/2022.  CT abdomen pelvis with contrast on 09/25/2022 shows normal kidney size.  2 to 3 mm nonobstructing stone in the left renal pelvis.  Other findings include acute gangrenous appendicitis, moderate coronary artery calcifications, cholelithiasis, sigmoid diverticulosis, mild prostatic hypertrophy,  He reports significant lower extremity edema which has been building over many weeks.    Review of Systems: Review of Systems  Constitutional:  Negative for chills, fever and weight loss.  HENT:  Negative for hearing loss.   Eyes:  Negative for blurred vision and double vision.  Respiratory:  Negative for cough and hemoptysis.   Cardiovascular:  Negative for chest pain and palpitations.  Gastrointestinal:  Positive for abdominal pain. Negative for heartburn and nausea.  Genitourinary:  Negative for dysuria and urgency.  Musculoskeletal:  Negative for myalgias.  Skin:  Negative for rash.  Neurological:  Negative for dizziness.  Endo/Heme/Allergies:  Does not bruise/bleed easily.  Psychiatric/Behavioral:  Negative for depression.     Past Medical History:  Diagnosis Date   Anxiety    CHF (congestive heart failure) (HCC)    Depression    Diabetes mellitus without complication (HCC)     Hypertension     Social History   Tobacco Use   Smoking status: Never   Smokeless tobacco: Never  Vaping Use   Vaping Use: Never used  Substance Use Topics   Alcohol use: Yes    Comment: rarely   Drug use: Never    Family History  Problem Relation Age of Onset   Heart failure Mother      OBJECTIVE: Blood pressure 124/76, pulse 74, temperature 98.4 F (36.9 C), temperature source Oral, resp. rate 16, height 5\' 7"  (1.702 m), weight 101.6 kg, SpO2 100 %.  Physical Exam Physical Exam: General:  No acute distress, laying in the bed  HEENT  anicteric, moist oral mucous membrane  Pulm/lungs  normal breathing effort, lungs are clear to auscultation  CVS/Heart  regular rhythm, no rub or gallop  Abdomen:   Soft, mildly distended.  Mild tenderness likely from surgery.  Extremities:  ++ peripheral edema  Neurologic:  Alert, oriented, able to follow commands  Skin:  No acute rashes     Lab Results Lab Results  Component Value Date   WBC 12.3 (H) 09/27/2022   HGB 10.8 (L) 09/27/2022   HCT 32.9 (L) 09/27/2022   MCV 88.4 09/27/2022   PLT 230 09/27/2022    Lab Results  Component Value Date   CREATININE 3.02 (H) 09/28/2022   BUN 44 (H) 09/28/2022   NA 138 09/28/2022   K 3.6 09/28/2022   CL 110 09/28/2022   CO2 20 (L) 09/28/2022    Lab Results  Component Value Date   ALT 27 09/25/2022   AST 17 09/25/2022  ALKPHOS 155 (H) 09/25/2022   BILITOT 1.6 (H) 09/25/2022     Microbiology: Recent Results (from the past 240 hour(s))  Urine Culture     Status: Abnormal   Collection Time: 09/25/22  6:30 PM   Specimen: Urine, Clean Catch  Result Value Ref Range Status   Specimen Description   Final    URINE, CLEAN CATCH Performed at [facility], [address], [place], [place] [address]    Special Requests   Final    NONE Performed at [facility] [address] [place], [place] [address]    Culture (A)  Final    <10,000 COLONIES/mL INSIGNIFICANT  GROWTH Performed at [facility], [address], [place], [place] [address]    Report Status 09/27/2022 FINAL  Final  Aerobic/Anaerobic Culture w Gram Stain (surgical/deep wound)     Status: None (Preliminary result)   Collection Time: 09/26/22  1:21 PM   Specimen: Appendix; GI  Result Value Ref Range Status   Specimen Description   Final    APPENDIX Performed at [facility], [address], [place], [place] [address]    Special Requests   Final    NONE Performed at [facility], [address] [place], [place] [address]    Gram Stain   Final    MODERATE WBC PRESENT, PREDOMINANTLY MONONUCLEAR NO ORGANISMS SEEN    Culture   Final    NO GROWTH 1 DAY Performed at [facility], [address], [place], [place] [address]    Report Status PENDING  Incomplete    Medications: Scheduled Meds:  busPIRone  7.5 mg Oral BID   insulin aspart  0-20 Units Subcutaneous Q4H   insulin glargine-yfgn  10 Units Subcutaneous BID   isosorbide mononitrate  15 mg Oral Daily   metoprolol succinate  50 mg Oral Daily   pantoprazole (PROTONIX) IV  40 mg Intravenous QHS   rosuvastatin  20 mg Oral Daily   Continuous Infusions:  sodium chloride     sodium chloride 100 mL/hr at 09/28/22 0836   ciprofloxacin     metronidazole     PRN Meds:.sodium chloride, HYDROmorphone (DILAUDID) injection, ondansetron **OR** ondansetron (ZOFRAN) IV, oxyCODONE-acetaminophen  No Known Allergies  Urinalysis: Recent Labs    09/25/22 1830  COLORURINE AMBER*  LABSPEC 1.025  PHURINE 5.0  GLUCOSEU >=500*  HGBUR NEGATIVE  BILIRUBINUR NEGATIVE  KETONESUR 5*  PROTEINUR >=300*  NITRITE NEGATIVE  LEUKOCYTESUR NEGATIVE      Imaging: ECHOCARDIOGRAM COMPLETE  Result Date: 09/27/2022    ECHOCARDIOGRAM REPORT   Patient Name:   Leroy Simmons Date of Exam: 09/27/2022 Medical Rec #:  [id number]      Height:       67.0 in Accession #:    [id number]     Weight:       220.0 lb Date of  Birth:  1971-03-26      BSA:          2.106 m Patient Age:    51 years       BP:           131/87 mmHg Patient Gender: M              HR:           72 bpm. Exam Location:  [facility] Procedure: 2D Echo, Color Doppler and Cardiac Doppler Indications:     I50.31 congestive heart failure-Acute Diastolic  History:  Patient has prior history of Echocardiogram examinations, most                  recent 03/17/2022. CHF; Risk Factors:Hypertension and Diabetes.  Sonographer:     Humphrey Rolls Referring Phys:  [id number] Andris Baumann Diagnosing Phys: Lorine Bears MD  Sonographer Comments: Suboptimal parasternal window and no subcostal window. IMPRESSIONS  1. Left ventricular ejection fraction, by estimation, is 55 to 60%. The left ventricle has normal function. The left ventricle has no regional wall motion abnormalities. There is mild left ventricular hypertrophy. Left ventricular diastolic parameters are consistent with Grade II diastolic dysfunction (pseudonormalization).  2. Right ventricular systolic function is normal. The right ventricular size is normal. Tricuspid regurgitation signal is inadequate for assessing PA pressure.  3. Left atrial size was mildly dilated.  4. The mitral valve is normal in structure. Trivial mitral valve regurgitation. No evidence of mitral stenosis.  5. The aortic valve is normal in structure. Aortic valve regurgitation is not visualized. Aortic valve sclerosis/calcification is present, without any evidence of aortic stenosis.  6. The inferior vena cava is dilated in size with >50% respiratory variability, suggesting right atrial pressure of 8 mmHg. FINDINGS  Left Ventricle: Left ventricular ejection fraction, by estimation, is 55 to 60%. The left ventricle has normal function. The left ventricle has no regional wall motion abnormalities. Global longitudinal strain performed but not reported based on interpreter judgement due to suboptimal tracking. The left ventricular internal cavity size  was normal in size. There is mild left ventricular hypertrophy. Left ventricular diastolic parameters are consistent with Grade II diastolic dysfunction (pseudonormalization). Right Ventricle: The right ventricular size is normal. No increase in right ventricular wall thickness. Right ventricular systolic function is normal. Tricuspid regurgitation signal is inadequate for assessing PA pressure. Left Atrium: Left atrial size was mildly dilated. Right Atrium: Right atrial size was normal in size. Pericardium: There is no evidence of pericardial effusion. Mitral Valve: The mitral valve is normal in structure. Trivial mitral valve regurgitation. No evidence of mitral valve stenosis. Tricuspid Valve: The tricuspid valve is normal in structure. Tricuspid valve regurgitation is mild . No evidence of tricuspid stenosis. Aortic Valve: The aortic valve is normal in structure. Aortic valve regurgitation is not visualized. Aortic valve sclerosis/calcification is present, without any evidence of aortic stenosis. Aortic valve mean gradient measures 3.0 mmHg. Aortic valve peak  gradient measures 5.4 mmHg. Aortic valve area, by VTI measures 2.68 cm. Pulmonic Valve: The pulmonic valve was normal in structure. Pulmonic valve regurgitation is mild. No evidence of pulmonic stenosis. Aorta: The aortic root is normal in size and structure. Venous: The inferior vena cava is dilated in size with greater than 50% respiratory variability, suggesting right atrial pressure of 8 mmHg. IAS/Shunts: No atrial level shunt detected by color flow Doppler.  LEFT VENTRICLE PLAX 2D LVIDd:         4.20 cm   Diastology LVIDs:         2.90 cm   LV e' medial:    5.98 cm/s LV PW:         1.60 cm   LV E/e' medial:  24.2 LV IVS:        0.90 cm   LV e' lateral:   7.40 cm/s LVOT diam:     2.20 cm   LV E/e' lateral: 19.6 LV SV:         64 LV SV Index:   30 LVOT Area:     3.80 cm  RIGHT VENTRICLE RV Basal diam:  3.00 cm RV S prime:     10.30 cm/s LEFT ATRIUM              Index        RIGHT ATRIUM           Index LA diam:        4.40 cm 2.09 cm/m   RA Area:     10.20 cm LA Vol (A2C):   58.2 ml 27.64 ml/m  RA Volume:   20.50 ml  9.73 ml/m LA Vol (A4C):   59.9 ml 28.44 ml/m LA Biplane Vol: 63.2 ml 30.01 ml/m  AORTIC VALVE                    PULMONIC VALVE AV Area (Vmax):    2.94 cm     PV Vmax:       0.87 m/s AV Area (Vmean):   3.03 cm     PV Vmean:      71.200 cm/s AV Area (VTI):     2.68 cm     PV VTI:        0.218 m AV Vmax:           116.00 cm/s  PV Peak grad:  3.0 mmHg AV Vmean:          81.500 cm/s  PV Mean grad:  2.0 mmHg AV VTI:            0.238 m AV Peak Grad:      5.4 mmHg AV Mean Grad:      3.0 mmHg LVOT Vmax:         89.70 cm/s LVOT Vmean:        64.900 cm/s LVOT VTI:          0.168 m LVOT/AV VTI ratio: 0.71  AORTA Ao Root diam: 3.70 cm MITRAL VALVE MV Area (PHT): 3.85 cm     SHUNTS MV Decel Time: 197 msec     Systemic VTI:  0.17 m MV E velocity: 145.00 cm/s  Systemic Diam: 2.20 cm MV A velocity: 32.40 cm/s MV E/A ratio:  4.48 Lorine Bears MD Electronically signed by Lorine Bears MD Signature Date/Time: 09/27/2022/12:46:35 PM    Final       Assessment/Plan:  JACAI KIPP is a 51 y.o. male with medical problems of anxiety, depression, hypertension, CKD, diabetes, chronic diastolic CHF, coronary artery disease hospitalization for CHF exacerbation in June 2023    was admitted on 09/25/2022 for :  Acute appendicitis [K35.80] Acute appendicitis with localized peritonitis and gangrene, without perforation or abscess [K35.31]  Acute kidney injury Chronic kidney disease stage II.  Baseline creatinine 1.35/GFR greater than 60 from 09/26/2022 Diabetes type 2 with CKD.  Hemoglobin A1c 8.0% from 09/26/2022.  Hemoglobin A1c 10.7% from March 2023. Severe hypertension Proteinuria, most likely secondary to diabetes Lower extremity edema  Assessment: Acute kidney injury likely secondary to IV contrast exposure and concurrent illness.Patient appears  to have early stages of diabetic nephropathy.  He has significant proteinuria, mildly elevated creatinine, at baseline.  Recommendations: Avoid nephrotoxins including IV contrast, nonsteroidals, and metabolic asides. Avoid hypotension.  Maintain normal hemodynamics.  Blood pressure is better controlled now. Maintain blood sugar control.  Discussed with patient to follow strict diabetic diet. We will obtain urine protein to creatinine ratio. Will discontinue IV fluid supplementation as he has developed significant lower extremity edema.  He states that he is able to eat now without nausea  or vomiting. No acute indication for dialysis at present.  Will monitor along with you.  Zayne Draheim Thedore MinsSingh 09/28/22

## 2022-09-28 NOTE — Progress Notes (Addendum)
[facility] SURGICAL PROGRESS NOTE  Hospital Day(s): 3.   Post op day(s): 2 Days Post-Op.   Interval History:  Patient seen and examined No acute events or new complaints overnight.  Patient reports continues abdominal pain; worse with movement; limiting his ambulation No fever, chills, nausea, emesis Renal function continues to worsen despite resuscitation; sCr - 3.02 / BUN - 44; UO - 400 ccs + unmeasured GFR - 24 No electrolyte derangements Surgical drain with 275 ccs out On diet; tolerating Continues on Zosyn   Vital signs in last 24 hours: [min-max] current  Temp:  [97.9 F (36.6 C)-98.2 F (36.8 C)] 98.2 F (36.8 C) (12/27 0408) Pulse Rate:  [73-81] 81 (12/27 0408) Resp:  [16-18] 18 (12/27 0408) BP: (103-140)/(64-91) 108/64 (12/27 0408) SpO2:  [93 %-99 %] 93 % (12/27 0408) Weight:  [101.6 kg] 101.6 kg (12/27 0408)     Height: 5\' 7"  (170.2 cm) Weight: 101.6 kg BMI (Calculated): 35.07   Intake/Output last 2 shifts:  12/26 0701 - 12/27 0700 In: 1384.5 [P.O.:240; I.V.:963.6; IV Piggyback:180.9] Out: 675 [Urine:400; Drains:275]   Physical Exam:  Constitutional: alert, cooperative and no distress  Respiratory: breathing non-labored at rest  Cardiovascular: regular rate and sinus rhythm  Gastrointestinal: Soft, incisional soreness, non-distended, no rebound/guarding. Surgical drain with serosanguinous output  Integumentary: Laparoscopic incisions are CDI with dermabond, no erythema or drainage   Labs:     Latest Ref Rng & Units 09/27/2022    4:07 AM 09/25/2022    2:39 PM 07/29/2022   12:40 PM  CBC  WBC 4.0 - 10.5 K/uL 12.3  14.1  7.7   Hemoglobin 13.0 - 17.0 g/dL 07/31/2022  75.1  70.0   Hematocrit 39.0 - 52.0 % 32.9  36.5  40.4   Platelets 150 - 400 K/uL 230  234  315       Latest Ref Rng & Units 09/28/2022    4:36 AM 09/27/2022   12:21 PM 09/27/2022    4:07 AM  CMP  Glucose 70 - 99 mg/dL 09/29/2022  944  967   BUN 6 - 20 mg/dL 44  37  36   Creatinine  0.61 - 1.24 mg/dL 591  6.38  4.66   Sodium 135 - 145 mmol/L 138  132  136   Potassium 3.5 - 5.1 mmol/L 3.6  3.7  4.0   Chloride 98 - 111 mmol/L 110  103  107   CO2 22 - 32 mmol/L 20  19  20    Calcium 8.9 - 10.3 mg/dL 8.3  8.2  8.1     Imaging studies: No new pertinent imaging studies   Assessment/Plan:  51 y.o. male with continued, worsening, AKI 2 Days Post-Op s/p laparoscopic appendectomy for acute appendicitis with peritoneal abscess.   - Okay to continue diet as tolerated  - Continue IVF resuscitation; up to 100 ml/hr this AM  - Continue IV Abx; switched to Cipro/Flagyl this morning given AKI  - Will ask nephrology to see    - Monitor abdominal examination; on-going bowel function - Pain control prn; Avoid Nephrotoxic agents - Antiemetics prn - Monitor renal function; morning labs - Mobilize; patient requesting PT eval    - Discharge Planning: Continues to have worsening AKI, not ready for DC   All of the above findings and recommendations were discussed with the patient, and the medical team, and all of patient's questions were answered to his expressed satisfaction.  -- , PA-C [facility] 09/28/2022, 7:19 AM  M-F: 7am - 4pm

## 2022-09-29 LAB — CBC
HCT: 32.6 % — ABNORMAL LOW (ref 39.0–52.0)
Hemoglobin: 10.6 g/dL — ABNORMAL LOW (ref 13.0–17.0)
MCH: 28.6 pg (ref 26.0–34.0)
MCHC: 32.5 g/dL (ref 30.0–36.0)
MCV: 87.9 fL (ref 80.0–100.0)
Platelets: 305 10*3/uL (ref 150–400)
RBC: 3.71 MIL/uL — ABNORMAL LOW (ref 4.22–5.81)
RDW: 12.5 % (ref 11.5–15.5)
WBC: 10.2 10*3/uL (ref 4.0–10.5)
nRBC: 0 % (ref 0.0–0.2)

## 2022-09-29 LAB — BASIC METABOLIC PANEL
Anion gap: 7 (ref 5–15)
BUN: 43 mg/dL — ABNORMAL HIGH (ref 6–20)
CO2: 20 mmol/L — ABNORMAL LOW (ref 22–32)
Calcium: 8.4 mg/dL — ABNORMAL LOW (ref 8.9–10.3)
Chloride: 110 mmol/L (ref 98–111)
Creatinine, Ser: 2.92 mg/dL — ABNORMAL HIGH (ref 0.61–1.24)
GFR, Estimated: 26 mL/min — ABNORMAL LOW (ref >60)
Glucose, Bld: 133 mg/dL — ABNORMAL HIGH (ref 70–99)
Potassium: 3.6 mmol/L (ref 3.5–5.1)
Sodium: 137 mmol/L (ref 135–145)

## 2022-09-29 LAB — GLUCOSE, CAPILLARY
Glucose-Capillary: 116 mg/dL — ABNORMAL HIGH (ref 70–99)
Glucose-Capillary: 150 mg/dL — ABNORMAL HIGH (ref 70–99)
Glucose-Capillary: 154 mg/dL — ABNORMAL HIGH (ref 70–99)
Glucose-Capillary: 163 mg/dL — ABNORMAL HIGH (ref 70–99)
Glucose-Capillary: 165 mg/dL — ABNORMAL HIGH (ref 70–99)
Glucose-Capillary: 183 mg/dL — ABNORMAL HIGH (ref 70–99)
Glucose-Capillary: 204 mg/dL — ABNORMAL HIGH (ref 70–99)

## 2022-09-29 NOTE — Progress Notes (Signed)
[facility] SURGICAL PROGRESS NOTE  Hospital Day(s): 4.   Post op day(s): 3 Days Post-Op.   Interval History:  Patient seen and examined No acute events or new complaints overnight.  Patient reports he is having a better day; still with incisional soreness but feels this is improved some today No fever, chills, nausea, emesis Remains without leukocytosis; 10.2K Renal function now improving; sCr - 2.92 / BUN - 43 / GFR - 26; UO - 825 ccs No electrolyte derangements Surgical drain with 450 ccs out; serosanguinous On diet; tolerating Switched to Cipro + Flagyl  Vital signs in last 24 hours: [min-max] current  Temp:  [98.2 F (36.8 C)-98.5 F (36.9 C)] 98.5 F (36.9 C) (12/28 0738) Pulse Rate:  [74-81] 77 (12/28 0738) Resp:  [14-18] 18 (12/28 0738) BP: (124-151)/(61-103) 137/86 (12/28 0738) SpO2:  [94 %-100 %] 96 % (12/28 0738) Weight:  [109.5 kg] 109.5 kg (12/28 0321)     Height: 5\' 7"  (170.2 cm) Weight: 109.5 kg BMI (Calculated): 37.8   Intake/Output last 2 shifts:  12/27 0701 - 12/28 0700 In: 581 [P.O.:240; IV Piggyback:341] Out: 1275 [Urine:825; Drains:450]   Physical Exam:  Constitutional: alert, cooperative and no distress  Respiratory: breathing non-labored at rest  Cardiovascular: regular rate and sinus rhythm  Gastrointestinal: Soft, incisional soreness, non-distended, no rebound/guarding. Surgical drain with serosanguinous output  Integumentary: Laparoscopic incisions are CDI with dermabond, no erythema or drainage   Labs:     Latest Ref Rng & Units 09/29/2022    5:53 AM 09/27/2022    4:07 AM 09/25/2022    2:39 PM  CBC  WBC 4.0 - 10.5 K/uL 10.2  12.3  14.1   Hemoglobin 13.0 - 17.0 g/dL 09/27/2022  65.7  84.6   Hematocrit 39.0 - 52.0 % 32.6  32.9  36.5   Platelets 150 - 400 K/uL 305  230  234       Latest Ref Rng & Units 09/29/2022    5:53 AM 09/28/2022    4:36 AM 09/27/2022   12:21 PM  CMP  Glucose 70 - 99 mg/dL 09/29/2022  952  841   BUN 6 - 20  mg/dL 43  44  37   Creatinine 0.61 - 1.24 mg/dL 324  4.01  0.27   Sodium 135 - 145 mmol/L 137  138  132   Potassium 3.5 - 5.1 mmol/L 3.6  3.6  3.7   Chloride 98 - 111 mmol/L 110  110  103   CO2 22 - 32 mmol/L 20  20  19    Calcium 8.9 - 10.3 mg/dL 8.4  8.3  8.2     Imaging studies: No new pertinent imaging studies   Assessment/Plan:  51 y.o. male with now improving AKI 3 Days Post-Op s/p laparoscopic appendectomy for acute appendicitis with peritoneal abscess.   - Okay to continue diet as tolerated  - Continue IV Abx (Cipro/Flagyl)  - Monitor abdominal examination; on-going bowel function  - Monitor surgical drain; monitor and record output; he will DC home with this  - Pain control prn; avoid Nephrotoxic agents - Antiemetics prn - Monitor renal function; nephrology on-board; appreciate help - Mobilize; worked with PT: no recommendations   - Discharge Planning: AKI improving, still not ready, anticipate DC tomorrow (12/29). I made his follow up appointment for 01/04 and updated DC instructions today as well.   All of the above findings and recommendations were discussed with the patient, and the medical team, and all of patient's questions  were answered to his expressed satisfaction.  -- Lynden Oxford, PA-C [facility] 09/29/2022, 7:40 AM M-F: 7am - 4pm

## 2022-09-29 NOTE — Progress Notes (Signed)
[facility]  ROUNDING NOTE   Subjective:   Patient seen sitting up in chair Alert and oriented Tolerating meals without nausea or vomiting Continues to complain of abdominal distention  Objective:  Vital signs in last 24 hours:  Temp:  [98.2 F (36.8 C)-98.5 F (36.9 C)] 98.5 F (36.9 C) (12/28 0738) Pulse Rate:  [76-81] 77 (12/28 0738) Resp:  [14-18] 18 (12/28 0738) BP: (136-151)/(61-103) 137/86 (12/28 0738) SpO2:  [94 %-96 %] 96 % (12/28 0738) Weight:  [109.5 kg] 109.5 kg (12/28 0321)  Weight change: 7.9 kg Filed Weights   09/25/22 1437 09/28/22 0408 09/29/22 0321  Weight: 99.8 kg 101.6 kg 109.5 kg    Intake/Output: I/O last 3 completed shifts: In: 1228.5 [P.O.:240; I.V.:645; IV Piggyback:343.5] Out: 1775 [Urine:1225; Drains:550]   Intake/Output this shift:  Total I/O In: -  Out: 80 [Drains:80]  Physical Exam: General: NAD,   Head: Normocephalic, atraumatic. Moist oral mucosal membranes  Eyes: Anicteric  Lungs:  Clear to auscultation, normal effort, room air  Heart: Regular rate and rhythm  Abdomen:  Firm, nontender, distended  Extremities: 1-2+ peripheral edema.  Neurologic: Alert, oriented, moving all four extremities  Skin: No lesions  Access: None    Basic Metabolic Panel: Recent Labs  Lab 09/26/22 0054 09/27/22 0407 09/27/22 1221 09/28/22 0436 09/29/22 0553  NA 135 136 132* 138 137  K 3.2* 4.0 3.7 3.6 3.6  CL 103 107 103 110 110  CO2 24 20* 19* 20* 20*  GLUCOSE 168* 286* 251* 204* 133*  BUN 25* 36* 37* 44* 43*  CREATININE 1.35* 2.06* 2.51* 3.02* 2.92*  CALCIUM 8.2* 8.1* 8.2* 8.3* 8.4*    Liver Function Tests: Recent Labs  Lab 09/25/22 1439  AST 17  ALT 27  ALKPHOS 155*  BILITOT 1.6*  PROT 6.5  ALBUMIN 2.4*   Recent Labs  Lab 09/25/22 1439  LIPASE 39   No results for input(s): "AMMONIA" in the last 168 hours.  CBC: Recent Labs  Lab 09/25/22 1439 09/27/22 0407 09/29/22 0553  WBC 14.1* 12.3* 10.2  HGB  11.9* 10.8* 10.6*  HCT 36.5* 32.9* 32.6*  MCV 87.7 88.4 87.9  PLT 234 230 305    Cardiac Enzymes: No results for input(s): "CKTOTAL", "CKMB", "CKMBINDEX", "TROPONINI" in the last 168 hours.  BNP: Invalid input(s): "POCBNP"  CBG: Recent Labs  Lab 09/28/22 1947 09/29/22 0039 09/29/22 0314 09/29/22 0739 09/29/22 1142  GLUCAP 175* 163* 150* 116* 154*    Microbiology: Results for orders placed or performed during the hospital encounter of 09/25/22  Urine Culture     Status: Abnormal   Collection Time: 09/25/22  6:30 PM   Specimen: Urine, Clean Catch  Result Value Ref Range Status   Specimen Description   Final    URINE, CLEAN CATCH Performed at [facility], [address], [place], [place] [address]    Special Requests   Final    NONE Performed at [facility], [address], [place], [place] [address]    Culture (A)  Final    <10,000 COLONIES/mL INSIGNIFICANT GROWTH Performed at [facility], [address] [place], [place] [address]    Report Status 09/27/2022 FINAL  Final  Aerobic/Anaerobic Culture w Gram Stain (surgical/deep wound)     Status: None (Preliminary result)   Collection Time: 09/26/22  1:21 PM   Specimen: Appendix; GI  Result Value Ref Range Status   Specimen Description   Final    APPENDIX Performed at [facility], [address], [place], [place] [address]    Special Requests   Final    NONE Performed at [facility], [address] [place], [place] [address]    Gram Stain   Final    MODERATE WBC PRESENT, PREDOMINANTLY MONONUCLEAR NO ORGANISMS SEEN    Culture   Final    HOLDING FOR POSSIBLE ANAEROBE Performed at [facility], [address] [place], [place] [address]    Report Status PENDING  Incomplete    Coagulation Studies: No results for input(s): "LABPROT", "INR" in the last 72 hours.  Urinalysis: No results for input(s): "COLORURINE", "LABSPEC", "PHURINE", "GLUCOSEU",  "HGBUR", "BILIRUBINUR", "KETONESUR", "PROTEINUR", "UROBILINOGEN", "NITRITE", "LEUKOCYTESUR" in the last 72 hours.  Invalid input(s): "APPERANCEUR"    Imaging: DG Chest 2 View  Result Date: 09/28/2022 CLINICAL DATA:  Short of breath EXAM: CHEST - 2 VIEW COMPARISON:  09/25/2022 FINDINGS: Frontal and lateral views of the chest demonstrate an enlarged cardiac silhouette. Lung volumes are diminished, with increased central vascular congestion and diffuse interstitial prominence suggesting interstitial edema. No effusion or pneumothorax. No acute bony abnormality. IMPRESSION: 1. Findings consistent with congestive heart failure and interstitial edema. Electronically Signed   By: Sharlet Salina M.D.   On: 09/28/2022 18:19   US RENAL  Result Date: 09/28/2022 CLINICAL DATA:  Acute renal injury. EXAM: RENAL / URINARY TRACT ULTRASOUND COMPLETE COMPARISON:  Renal ultrasound dated 06/02/2022 FINDINGS: Right Kidney: Length = 11.6 cm, previously 11.7 cm AP renal pelvis diameter = <10 mm Normal parenchymal echogenicity with preserved corticomedullary differentiation. Previously noted lower pole cyst is not seen. No urinary tract dilation or shadowing calculi. The ureter is not seen. Left Kidney: Length = 12.5 cm cm, previously 11.8 cm AP renal pelvis diameter = <10 mm Normal parenchymal echogenicity with preserved corticomedullary differentiation. No urinary tract dilation or shadowing calculi. The ureter is not seen. Bladder: Appears normal for degree of bladder distention. Other: None. IMPRESSION: Normal renal ultrasound examination. Previously noted right lower pole cyst is not seen. Electronically Signed   By: Agustin Cree M.D.   On: 09/28/2022 10:21     Medications:    sodium chloride     ciprofloxacin 400 mg (09/29/22 6599)   metronidazole 500 mg (09/29/22 0922)    busPIRone  7.5 mg Oral BID   insulin aspart  0-20 Units Subcutaneous Q4H   insulin glargine-yfgn  10 Units Subcutaneous BID   isosorbide  mononitrate  15 mg Oral Daily   metoprolol succinate  50 mg Oral Daily   pantoprazole (PROTONIX) IV  40 mg Intravenous QHS   rosuvastatin  20 mg Oral Daily   sodium chloride, HYDROmorphone (DILAUDID) injection, ondansetron **OR** ondansetron (ZOFRAN) IV, oxyCODONE-acetaminophen  Assessment/ Plan:  Leroy Simmons is a 51 y.o.  male with medical problems of anxiety, depression, hypertension, CKD, diabetes, chronic diastolic CHF, coronary artery disease hospitalization for CHF exacerbation in June 2023    was admitted on 09/25/2022 for : Acute appendicitis [K35.80] Acute appendicitis with localized peritonitis and gangrene, without perforation or abscess [K35.31]  Acute kidney injury Chronic kidney disease stage II.  Baseline creatinine 1.35/GFR greater than 60 from 09/26/2022 Diabetes type 2 with CKD.  Hemoglobin A1c 8.0% from 09/26/2022.  Hemoglobin A1c 10.7% from March 2023. 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  He has significant proteinuria, mildly  elevated creatinine, at baseline.   Recommendations: Avoid nephrotoxins including IV contrast, nonsteroidals, and metabolic asides. Avoid hypotension.   Creatinine slightly improved overnight, adequate urine output recorded.  Patient reports improved appetite.  Continue to encourage adherence to diabetic diet.  No indication for dialysis at this time.  Patient will need follow-up appointment in our office at discharge. Lab Results  Component Value Date   CREATININE 2.92 (H) 09/29/2022   CREATININE 3.02 (H) 09/28/2022   CREATININE 2.51 (H) 09/27/2022    Intake/Output Summary (Last 24 hours) at 09/29/2022 1310 Last data filed at 09/29/2022 0805 Gross per 24 hour  Intake 341.02 ml  Output 1145 ml  Net -803.98 ml        LOS: 4 Leroy Simmons 12/28/20231:10 PM

## 2022-09-29 NOTE — TOC Progression Note (Signed)
Transition of Care [facility]) - Progression Note    Patient Details  Name: Leroy Simmons MRN: [id number] Date of Birth: 27-Dec-1970  [facility] of [facility]) CM/SW Contact  Chapman Fitch, RN Phone Number: 09/29/2022, 3:30 PM  Clinical Narrative:    Therapy recommends not PT follow up.  They do recommend RW and patient would like one.  RW referral made to [facility] with [employer]  Patient states that a friend will be transporting at discharge    Expected Discharge Plan: Home/Self Care Barriers to Discharge: Barriers Resolved  Expected Discharge Plan and Services         Expected Discharge Date: 09/27/22                                     Social Determinants of Health (SDOH) Interventions SDOH Screenings   Depression (PHQ2-9): Low Risk  (05/24/2022)  Recent Concern: Depression (PHQ2-9) - High Risk (03/10/2022)  Tobacco Use: Low Risk  (09/27/2022)    Readmission Risk Interventions    09/27/2022   12:36 PM  Readmission Risk Prevention Plan  Transportation Screening Complete  PCP or Specialist Appt within 3-5 Days Complete  Palliative Care Screening Not Applicable  Medication Review (RN Care Manager) Complete

## 2022-09-29 NOTE — Progress Notes (Signed)
Physical Therapy Treatment Patient Details Name: Leroy Simmons MRN: [id number] DOB: [birth year] Today's Date: 09/29/2022   History of Present Illness 51 y/o male presented to ED on 09/25/22 for RLQ x 3 days with associated nausea, chills, and loss of appetite. CT showed acute gangrenous appendicitis. S/p laparoscopic appendectomy on 12/25. PMH: CHF, anxiety, depression, T2DM, HTN    PT Comments    Patient progressing towards physical therapy goals. Improved abdominal pain reported this session during mobility. Overall, functioning at supervision level with RW. Able to ambulate short distance in the room with no AD but prefers RW for longer distances to ensure upright posture. Encouraged continued mobility throughout the day with staff assistance. No PT follow up recommended at this time.     Recommendations for follow up therapy are one component of a multi-disciplinary discharge planning process, led by the attending physician.  Recommendations may be updated based on patient status, additional functional criteria and insurance authorization.  Follow Up Recommendations  No PT follow up     Assistance Recommended at Discharge PRN  Patient can return home with the following     Equipment Recommendations  Rolling Godfrey Tritschler (2 wheels)    Recommendations for Other Services       Precautions / Restrictions Precautions Precautions: Fall Restrictions Weight Bearing Restrictions: No     Mobility  Bed Mobility Overal bed mobility: Needs Assistance Bed Mobility: Supine to Sit     Supine to sit: Supervision          Transfers Overall transfer level: Needs assistance Equipment used: Rolling Jovanni Eckhart (2 wheels) Transfers: Sit to/from Stand Sit to Stand: Supervision                Ambulation/Gait Ambulation/Gait assistance: Supervision Gait Distance (Feet): 360 Feet Assistive device: Rolling Emmerie Battaglia (2 wheels) Gait Pattern/deviations: Step-through pattern, Decreased stride  length Gait velocity: decreased     General Gait Details: supervision for safety. Improved posture throughout mobility this date   Stairs             Wheelchair Mobility    Modified Rankin (Stroke Patients Only)       Balance Overall balance assessment: Mild deficits observed, not formally tested                                          Cognition Arousal/Alertness: Awake/alert Behavior During Therapy: WFL for tasks assessed/performed Overall Cognitive Status: Within Functional Limits for tasks assessed                                          Exercises      General Comments        Pertinent Vitals/Pain Pain Assessment Pain Assessment: Faces Faces Pain Scale: Hurts even more Pain Location: abdomen Pain Descriptors / Indicators: Grimacing, Guarding Pain Intervention(s): Monitored during session    Home Living                          Prior Function            PT Goals (current goals can now be found in the care plan section) Acute Rehab PT Goals PT Goal Formulation: With patient Time For Goal Achievement: 10/12/22 Potential to Achieve Goals: Good Progress towards PT goals:  Progressing toward goals    Frequency    Min 2X/week      PT Plan Current plan remains appropriate    Co-evaluation              AM-PAC PT "6 Clicks" Mobility   Outcome Measure  Help needed turning from your back to your side while in a flat bed without using bedrails?: A Little Help needed moving from lying on your back to sitting on the side of a flat bed without using bedrails?: A Little Help needed moving to and from a bed to a chair (including a wheelchair)?: A Little Help needed standing up from a chair using your arms (e.g., wheelchair or bedside chair)?: A Little Help needed to walk in hospital room?: A Little Help needed climbing 3-5 steps with a railing? : A Little 6 Click Score: 18    End of Session    Activity Tolerance: Patient tolerated treatment well Patient left: in chair;with call bell/phone within reach Nurse Communication: Mobility status PT Visit Diagnosis: Unsteadiness on feet (R26.81);Muscle weakness (generalized) (M62.81)     Time: 4193-7902 PT Time Calculation (min) (ACUTE ONLY): 16 min  Charges:  $Therapeutic Activity: 8-22 mins                     Shalece Staffa A. Dan Humphreys PT, DPT [facility] - Acute Rehabilitation Services    Amalia Edgecombe A Cordarro Spinnato 09/29/2022, 1:15 PM

## 2022-09-29 NOTE — Progress Notes (Signed)
Progress Note   Patient: Leroy Simmons IRJ:[id number] DOB: 1971-03-24 DOA: 09/25/2022     4 DOS: the patient was seen and examined on 09/29/2022   Brief hospital course: 51 y.o. male with past medical history of anxiety, depression, HTN, CKD 2 diabetes mellitus, chronic diastolic heart failure (EF 50 to 55% 03/2022), CAD s/p cardiac cath 03/21/2022 that showed nonobstructive CAD, hospitalized for CHF exacerbation in June 2023, with last cardiology follow-up 6/27, who is being seen in consultation for management of medical problems after being admitted to the surgical service with acute appendicitis.  He presented to the ED with a 3-day history of RLQ pain, loss of appetite and diarrhea, with CT abdomen and pelvis showing acute gangrenous appendicitis without perforation as well as concerns for "Interval development of trace interstitial pulmonary edema and small right pleural effusion possibly reflecting changes of mild cardiogenic failure". Patient has had no recent chest pain, shortness of breath, weight gain lower extremity edema, orthopnea.  Denies palpitations and lightheadedness. On arrival in the ED BP was 181/105 and he was tachycardic to 101 with otherwise normal vitals.  CBC showed WBC 14,000, hemoglobin 11.9.  Troponin and BNP not done.  Other pertinent findings included glucose of 236, creatinine At baseline at 1.37, potassium 3.2.  Urinalysis showed many bacteria.  Lipase normal transaminases WNL, mild bilirubin elevation to 1.6. EKG, not yet done. Patient was treated with IV hydralazine for BP in the ED and was admitted to the surgical service.  Hospitalist consulted by primary surgical service.  12/26.  Patient feeling better and eating.  Creatinine this morning went up to 2.06.  A fluid bolus was given and creatinine was checked this afternoon and up to 2.51.  Will start IV fluids.  Cancel discharge and spoke with general surgery. 12/27: Creatinine continue to trend up despite getting IV  fluid, at 3.02.  Patient did receive contrast on 12/24.  Ordered renal ultrasound and nephrology consult.  Per nephrology note patient has CKD stage II with baseline creatinine around 1.35.  Significant lower extremity edema so IV fluids were discontinued.  Echocardiogram done yesterday with normal EF, grade 2 diastolic dysfunction and dilated IVC.  I will let nephrology decide about diuresis.  Renal ultrasound was normal. 12/28: Vital stable, some improvement in creatinine to 2.92.  Chest x-ray with concern of interstitial edema, BNP at 178 which is slightly improved from admission.  Hemoglobin stable. Nephrology would like to hold his diuretic at this time. Significant lower extremity edema-ordered some compression stockings with lower extremity elevation.  Assessment and Plan: * Acute kidney injury superimposed on CKD (HCC) Acute kidney injury on CKD stage II.  Creatinine went from 1.35 up to 2.06 on the morning of 12/26.  Patient was given a fluid bolus and creatinine then went up to 2.51 in the afternoon on 12/26.  Will give IV fluid overnight and recheck creatinine tomorrow. 12/27: Worsening creatinine and lower extremity edema.  Echocardiogram with normal EF and grade 2 diastolic dysfunction and a dilated IVC.  Renal ultrasound without any significant abnormality.  Slight improvement in renal function today-concern of contrast related nephropathy. Nephrology would like to keep holding diuretics -Monitor renal function -Avoid nephrotoxins  Acute gangrenous appendicitis Admitted to the surgical service by Dr. Claudine Mouton and taken to the OR on 12/25. Currently on IV Zosyn.  Hypertensive urgency Blood pressure now within goal. Blood pressure initially elevated secondary to pain.  Continue Toprol and Imdur.   Chronic diastolic CHF (congestive heart failure) (HCC) Holding  Lasix.  EF 55%.  Grade 2 diastolic dysfunction and dilated IVC. Patient did receive IV fluid with worsening renal  function.  Significant lower extremity edema but nephrology would like to hold off to diuretic at this time. Chest x-ray with concern of interstitial edema but patient breathing comfortably on room air. -Compression stockings and lower extremity elevation -Will let nephrology decide about Lasix  Uncontrolled type 2 diabetes mellitus with hyperglycemia, with long-term current use of insulin (HCC) Last hemoglobin A1c 8.0.  CBG currently elevated -Increasing the Semglee to twice daily -Continue SSI  Obesity (BMI 30-39.9) BMI 34.46  Pulmonary nodule Continue outpatient surveillance  Coronary artery disease involving native coronary artery of native heart without angina pectoris s/p cardiac cath 03/21/2022 that showed nonobstructive CAD Patient has no complaints of chest pain   Anxiety and depression Restart BuSpar  Asthma Not acutely exacerbated DuoNebs as needed  HYPOKALEMIA Replaced.  Since holding Lasix I will hold off on further potassium supplementation.  Subjective: Patient was seen and examined today.  Denies any shortness of breath or chest pain.  He was worried about his lower extremity swelling.  Physical Exam: Vitals:   09/29/22 0318 09/29/22 0321 09/29/22 0738 09/29/22 1520  BP: 136/61  137/86 (!) 148/89  Pulse: 81  77 76  Resp:   18 18  Temp:   98.5 F (36.9 C) 98 F (36.7 C)  TempSrc:   Oral Oral  SpO2:   96% 96%  Weight:  109.5 kg    Height:       General.  Obese gentleman, in no acute distress. Pulmonary.  Lungs clear bilaterally, normal respiratory effort. CV.  Regular rate and rhythm, no JVD, rub or murmur. Abdomen.  Soft, nontender, nondistended, BS positive. CNS.  Alert and oriented .  No focal neurologic deficit. Extremities.  3+ LE edema, no cyanosis, pulses intact and symmetrical. Psychiatry.  Judgment and insight appears normal.    Data Reviewed: Prior data reviewed  Family Communication: As per general surgery  Disposition: Status is:  Inpatient Remains inpatient appropriate because: Severity of illness  Planned Discharge Destination: Home  Time spent: 41 minutes  This record has been created using Conservation officer, historic buildings. Errors have been sought and corrected,but may not always be located. Such creation errors do not reflect on the standard of care.   Author: Arnetha Courser, MD 09/29/2022 3:26 PM  For on call review [email or web address].

## 2022-09-30 ENCOUNTER — Other Ambulatory Visit: Payer: Self-pay

## 2022-09-30 LAB — BASIC METABOLIC PANEL
Anion gap: 5 (ref 5–15)
BUN: 38 mg/dL — ABNORMAL HIGH (ref 6–20)
CO2: 24 mmol/L (ref 22–32)
Calcium: 9 mg/dL (ref 8.9–10.3)
Chloride: 113 mmol/L — ABNORMAL HIGH (ref 98–111)
Creatinine, Ser: 2.15 mg/dL — ABNORMAL HIGH (ref 0.61–1.24)
GFR, Estimated: 37 mL/min — ABNORMAL LOW (ref >60)
Glucose, Bld: 123 mg/dL — ABNORMAL HIGH (ref 70–99)
Potassium: 3.9 mmol/L (ref 3.5–5.1)
Sodium: 142 mmol/L (ref 135–145)

## 2022-09-30 LAB — GLUCOSE, CAPILLARY
Glucose-Capillary: 114 mg/dL — ABNORMAL HIGH (ref 70–99)
Glucose-Capillary: 125 mg/dL — ABNORMAL HIGH (ref 70–99)

## 2022-09-30 LAB — AEROBIC/ANAEROBIC CULTURE W GRAM STAIN (SURGICAL/DEEP WOUND)

## 2022-09-30 LAB — MAGNESIUM: Magnesium: 2.2 mg/dL (ref 1.7–2.4)

## 2022-09-30 MED ORDER — HYDROCODONE-ACETAMINOPHEN 5-325 MG PO TABS
1.0000 | ORAL_TABLET | ORAL | 0 refills | Status: DC | PRN
  Filled 2022-09-30: qty 20, 2d supply, fill #0

## 2022-09-30 NOTE — Discharge Summary (Signed)
Patient ID: Leroy Simmons MRN: [id number] DOB/AGE: Aug 22, 1971 51 y.o.  Admit date: 09/25/2022 Discharge date: 09/30/2022   Discharge Diagnoses:  Principal Problem:   Acute kidney injury superimposed on CKD [facility]) Active Problems:   HYPOKALEMIA   Asthma   Anxiety and depression   Coronary artery disease involving native coronary artery of native heart without angina pectoris   Chronic diastolic CHF (congestive heart failure) ([place])   Pulmonary nodule   Acute gangrenous appendicitis   Hypertensive urgency   Uncontrolled type 2 diabetes mellitus with hyperglycemia, with long-term current use of insulin (HCC)   Obesity (BMI 30-39.9)   Pre-operative cardiovascular examination   Heart failure with preserved ejection fraction [facility])   Procedures:  Laparoscopic appendectomy  Hospital Course:  Patient was admitted on 09/25/22 with acute appendicitis and was taken to the OR on 09/26/22 for laparoscopic appendectomy.  He was found to have a peritoneal abscess and a drain was left in place.  Post-operatively, the patient's renal function deteriorated and had AKI.  Hospitalist team and nephrology team were consulted and assisted in the patient's clinical management of his AKI as well as his other comorbidities including CHF.  His renal function started improving and he was deemed ready for discharge. Physical therapy evaluated the patient and rolling walker was recommended for home.  On exam, he is in no acute distress, with stable vital signs.  Abdomen soft, non-distended, appropriately tender to palpation.  Incisions are clean, dry, intact.  Drain with serosanguinous fluid.  Follow up in 1 week for drain removal.  Consults: Hospitalist, Nephrology, Physical Therapy, CM/SW team  Disposition:  Home, self-care  Discharge Instructions     Call MD for:  difficulty breathing, headache or visual disturbances   Complete by: As directed    Call MD for:  extreme fatigue   Complete by: As directed     Call MD for:  hives   Complete by: As directed    Call MD for:  persistant dizziness or light-headedness   Complete by: As directed    Call MD for:  persistant nausea and vomiting   Complete by: As directed    Call MD for:  redness, tenderness, or signs of infection (pain, swelling, redness, odor or green/yellow discharge around incision site)   Complete by: As directed    Call MD for:  severe uncontrolled pain   Complete by: As directed    Call MD for:  temperature >100.4   Complete by: As directed    Change dressing   Complete by: As directed    Dressing change:  apply dry gauze around drain insertion site, secure with tape.  Change once daily.   Diet Carb Modified   Complete by: As directed    Discharge instructions   Complete by: As directed    1.  Patient may shower, but do not scrub wounds heavily and dab dry only. 2.  Do not submerge wounds in pool/tub until fully healed. 3.  Do not apply ointments or hydrogen peroxide to the wounds. 4.  May apply ice packs to the wounds for comfort. 5.  Please empty and record drain output at least twice daily or as needed to keep the bulb only half full. 6.  Dressing:  apply dry gauze dressing around drain insertion site, secure with tape.  Change once daily.   Increase activity slowly   Complete by: As directed    Lifting restrictions   Complete by: As directed    20 lbs x  6 wks      Allergies as of 09/30/2022   No Known Allergies      Medication List     TAKE these medications    amLODipine 5 MG tablet Commonly known as: NORVASC Take 1 tablet (5 mg total) by mouth daily.   amoxicillin-clavulanate 875-125 MG tablet Commonly known as: AUGMENTIN Take 1 tablet by mouth 2 (two) times daily.   blood glucose meter kit and supplies Dispense based on patient and insurance preference. Use up to four times daily as directed. (FOR ICD-10 E10.9, E11.9).   busPIRone 5 MG tablet Commonly known as: BUSPAR TAKE 1 & 1/2 (ONE &  ONE-HALF) TABLETS BY MOUTH TWICE DAILY   furosemide 40 MG tablet Commonly known as: LASIX Take 1 tablet (40 mg total) by mouth daily.   HYDROcodone-acetaminophen 5-325 MG tablet Commonly known as: NORCO/VICODIN Take 1-2 tablets by mouth every 4 (four) hours as needed for moderate pain.   isosorbide mononitrate 30 MG 24 hr tablet Commonly known as: IMDUR Take 0.5 tablets (15 mg total) by mouth daily.   Jardiance 10 MG Tabs tablet Generic drug: empagliflozin Take 1 tablet (10 mg total) by mouth every morning.   Lantus SoloStar 100 UNIT/ML Solostar Pen Generic drug: insulin glargine Inject 26 Units into the skin daily. What changed: when to take this   metFORMIN 500 MG 24 hr tablet Commonly known as: GLUCOPHAGE-XR Take 4 tablets (2,000 mg total) by mouth at bedtime. What changed: how much to take   metoprolol succinate 50 MG 24 hr tablet Commonly known as: TOPROL-XL Take 1 tablet (50 mg total) by mouth daily. Take with or immediately following a meal. What changed: additional instructions   rosuvastatin 20 MG tablet Commonly known as: CRESTOR Take 1 tablet (20 mg total) by mouth daily.   sertraline 25 MG tablet Commonly known as: ZOLOFT Take 1 tablet (25 mg total) by mouth daily.   Unifine Pentips 31G X 5 MM Misc Generic drug: Insulin Pen Needle Use daily with insulin pen. (1 each by Does not apply route daily.)               Durable Medical Equipment  (From admission, onward)           Start     Ordered   09/29/22 1528  For home use only DME Walker rolling  Once       Question Answer Comment  Walker: With [employer] Whe[employer]   Patient needs a walker to treat with the following condition Weakness      09/29/22 1528              Discharge Care Instructions  (From admission, onward)           Start     Ordered   09/30/22 0000  Change dressing       Comments: Dressing change:  apply dry gauze around drain insertion site, secure with tape.   Change once daily.   09/30/22 0820            Follow-up Information     Ronny Bacon, MD. Go on 10/06/2022.   Specialty: General Surgery Why: Go to appointmnt on 01/04 at 11:15 AM Contact information: [address] [place] [address] [phone]

## 2022-09-30 NOTE — Progress Notes (Signed)
[facility]  ROUNDING NOTE   Subjective:   Patient seen sitting at side of bed, alert and oriented Appetite remains poor Lower extremity edema remains Room air  Urine output 1.6 L recorded Creatinine 2.15  Objective:  Vital signs in last 24 hours:  Temp:  [97.9 F (36.6 C)-98.3 F (36.8 C)] 98.1 F (36.7 C) (12/29 0739) Pulse Rate:  [70-78] 78 (12/29 0739) Resp:  [16-18] 18 (12/29 0739) BP: (140-171)/(78-102) 171/102 (12/29 0739) SpO2:  [96 %-100 %] 100 % (12/29 0739) Weight:  [108.4 kg] 108.4 kg (12/29 0359)  Weight change: -1.1 kg Filed Weights   09/28/22 0408 09/29/22 0321 09/30/22 0359  Weight: 101.6 kg 109.5 kg 108.4 kg    Intake/Output: I/O last 3 completed shifts: In: 341 [IV Piggyback:341] Out: 2545 [Urine:2175; Drains:370]   Intake/Output this shift:  Total I/O In: -  Out: 860 [Urine:800; Drains:60]  Physical Exam: General: NAD  Head: Normocephalic, atraumatic. Moist oral mucosal membranes  Eyes: Anicteric  Lungs:  Clear to auscultation, normal effort, room air  Heart: Regular rate and rhythm  Abdomen:  Firm, nontender, distended  Extremities: 2+ peripheral edema.  Neurologic: Alert, oriented, moving all four extremities  Skin: No lesions  Access: None    Basic Metabolic Panel: Recent Labs  Lab 09/27/22 0407 09/27/22 1221 09/28/22 0436 09/29/22 0553 09/30/22 0527  NA 136 132* 138 137 142  K 4.0 3.7 3.6 3.6 3.9  CL 107 103 110 110 113*  CO2 20* 19* 20* 20* 24  GLUCOSE 286* 251* 204* 133* 123*  BUN 36* 37* 44* 43* 38*  CREATININE 2.06* 2.51* 3.02* 2.92* 2.15*  CALCIUM 8.1* 8.2* 8.3* 8.4* 9.0  MG  --   --   --   --  2.2     Liver Function Tests: Recent Labs  Lab 09/25/22 1439  AST 17  ALT 27  ALKPHOS 155*  BILITOT 1.6*  PROT 6.5  ALBUMIN 2.4*    Recent Labs  Lab 09/25/22 1439  LIPASE 39    No results for input(s): "AMMONIA" in the last 168 hours.  CBC: Recent Labs  Lab 09/25/22 1439 09/27/22 0407  09/29/22 0553  WBC 14.1* 12.3* 10.2  HGB 11.9* 10.8* 10.6*  HCT 36.5* 32.9* 32.6*  MCV 87.7 88.4 87.9  PLT 234 230 305     Cardiac Enzymes: No results for input(s): "CKTOTAL", "CKMB", "CKMBINDEX", "TROPONINI" in the last 168 hours.  BNP: Invalid input(s): "POCBNP"  CBG: Recent Labs  Lab 09/29/22 1628 09/29/22 1916 09/29/22 2300 09/30/22 0354 09/30/22 0741  GLUCAP 204* 183* 165* 114* 125*     Microbiology: Results for orders placed or performed during the hospital encounter of 09/25/22  Urine Culture     Status: Abnormal   Collection Time: 09/25/22  6:30 PM   Specimen: Urine, Clean Catch  Result Value Ref Range Status   Specimen Description   Final    URINE, CLEAN CATCH Performed at [facility], [address], [place], [place] [address]    Special Requests   Final    NONE Performed at [facility], [address], [place], [place] [address]    Culture (A)  Final    <10,000 COLONIES/mL INSIGNIFICANT GROWTH Performed at [facility], [address], [place], [place] [address]    Report Status 09/27/2022 FINAL  Final  Aerobic/Anaerobic Culture w Gram Stain (surgical/deep wound)     Status: None   Collection Time: 09/26/22  1:21 PM   Specimen: Appendix;  GI  Result Value Ref Range Status   Specimen Description   Final    APPENDIX Performed at [facility], [address] [place], [place] [address]    Special Requests   Final    NONE Performed at [facility], [address] [place], [place] [address]    Gram Stain   Final    MODERATE WBC PRESENT, PREDOMINANTLY MONONUCLEAR NO ORGANISMS SEEN    Culture   Final    RARE BACTEROIDES THETAIOTAOMICRON BETA LACTAMASE POSITIVE Performed at [facility], [address], [place], [place] [address]    Report Status 09/30/2022 FINAL  Final    Coagulation Studies: No results for input(s): "LABPROT", "INR" in the last 72 hours.  Urinalysis: No results for  input(s): "COLORURINE", "LABSPEC", "PHURINE", "GLUCOSEU", "HGBUR", "BILIRUBINUR", "KETONESUR", "PROTEINUR", "UROBILINOGEN", "NITRITE", "LEUKOCYTESUR" in the last 72 hours.  Invalid input(s): "APPERANCEUR"    Imaging: DG Chest 2 View  Result Date: 09/28/2022 CLINICAL DATA:  Short of breath EXAM: CHEST - 2 VIEW COMPARISON:  09/25/2022 FINDINGS: Frontal and lateral views of the chest demonstrate an enlarged cardiac silhouette. Lung volumes are diminished, with increased central vascular congestion and diffuse interstitial prominence suggesting interstitial edema. No effusion or pneumothorax. No acute bony abnormality. IMPRESSION: 1. Findings consistent with congestive heart failure and interstitial edema. Electronically Signed   By: Sharlet Salina M.D.   On: 09/28/2022 18:19     Medications:    sodium chloride     ciprofloxacin 400 mg (09/30/22 0814)   metronidazole 500 mg (09/30/22 0817)    busPIRone  7.5 mg Oral BID   insulin aspart  0-20 Units Subcutaneous Q4H   insulin glargine-yfgn  10 Units Subcutaneous BID   isosorbide mononitrate  15 mg Oral Daily   metoprolol succinate  50 mg Oral Daily   pantoprazole (PROTONIX) IV  40 mg Intravenous QHS   rosuvastatin  20 mg Oral Daily   sodium chloride, HYDROmorphone (DILAUDID) injection, ondansetron **OR** ondansetron (ZOFRAN) IV, oxyCODONE-acetaminophen  Assessment/ Plan:  [name] is a 51 y.o.  male with medical problems of anxiety, depression, hypertension, CKD, diabetes, chronic diastolic CHF, coronary artery disease hospitalization for CHF exacerbation in June 2023    was admitted on 09/25/2022 for : Acute appendicitis [K35.80] Acute appendicitis with localized peritonitis and gangrene, without perforation or abscess [K35.31]  Acute kidney injury Chronic kidney disease stage II.  Baseline creatinine 1.35/GFR greater than 60 from 09/26/2022 Diabetes type 2 with CKD.  Hemoglobin A1c 8.0% from 09/26/2022.  Hemoglobin A1c 10.7%  from March 2023. 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    Recommendations: Avoid nephrotoxins including IV contrast, nonsteroidals, and metabolic asides. Avoid hypotension.   Creatinine continues to improve with acceptable urine output.  Due to lower extremity edema, would resume furosemide 40 mg daily at discharge.  Will schedule outpatient appointment at our office in 1 to 2 weeks for continued follow-up.  Patient encouraged to continue proper management of diabetes including monitoring and diet.  Patient encouraged to utilize compression hose to manage lower extremity edema. Lab Results  Component Value Date   CREATININE 2.15 (H) 09/30/2022   CREATININE 2.92 (H) 09/29/2022   CREATININE 3.02 (H) 09/28/2022    Intake/Output Summary (Last 24 hours) at 09/30/2022 1118 Last data filed at 09/30/2022 1015 Gross per 24 hour  Intake --  Output 2650 ml  Net -2650 ml        LOS: 5 Leroy Simmons 12/29/202311:18 AM

## 2022-09-30 NOTE — Progress Notes (Signed)
Progress Note   Patient: Leroy Simmons NIO:[id number] DOB: January 06, 1971 DOA: 09/25/2022     5 DOS: the patient was seen and examined on 09/30/2022   Brief hospital course: 51 y.o. male with past medical history of anxiety, depression, HTN, CKD 2 diabetes mellitus, chronic diastolic heart failure (EF 50 to 55% 03/2022), CAD s/p cardiac cath 03/21/2022 that showed nonobstructive CAD, hospitalized for CHF exacerbation in June 2023, with last cardiology follow-up 6/27, who is being seen in consultation for management of medical problems after being admitted to the surgical service with acute appendicitis.  He presented to the ED with a 3-day history of RLQ pain, loss of appetite and diarrhea, with CT abdomen and pelvis showing acute gangrenous appendicitis without perforation as well as concerns for "Interval development of trace interstitial pulmonary edema and small right pleural effusion possibly reflecting changes of mild cardiogenic failure". Patient has had no recent chest pain, shortness of breath, weight gain lower extremity edema, orthopnea.  Denies palpitations and lightheadedness. On arrival in the ED BP was 181/105 and he was tachycardic to 101 with otherwise normal vitals.  CBC showed WBC 14,000, hemoglobin 11.9.  Troponin and BNP not done.  Other pertinent findings included glucose of 236, creatinine At baseline at 1.37, potassium 3.2.  Urinalysis showed many bacteria.  Lipase normal transaminases WNL, mild bilirubin elevation to 1.6. EKG, not yet done. Patient was treated with IV hydralazine for BP in the ED and was admitted to the surgical service.  Hospitalist consulted by primary surgical service.  12/26.  Patient feeling better and eating.  Creatinine this morning went up to 2.06.  A fluid bolus was given and creatinine was checked this afternoon and up to 2.51.  Will start IV fluids.  Cancel discharge and spoke with general surgery. 12/27: Creatinine continue to trend up despite getting IV  fluid, at 3.02.  Patient did receive contrast on 12/24.  Ordered renal ultrasound and nephrology consult.  Per nephrology note patient has CKD stage II with baseline creatinine around 1.35.  Significant lower extremity edema so IV fluids were discontinued.  Echocardiogram done yesterday with normal EF, grade 2 diastolic dysfunction and dilated IVC.  I will let nephrology decide about diuresis.  Renal ultrasound was normal. 12/28: Vital stable, some improvement in creatinine to 2.92.  Chest x-ray with concern of interstitial edema, BNP at 178 which is slightly improved from admission.  Hemoglobin stable. Nephrology would like to hold his diuretic at this time. Significant lower extremity edema-ordered some compression stockings with lower extremity elevation. 12/29: Hemodynamically stable and creatinine continue to improve, at 2.15.  Patient is being discharged and he will resume his home Lasix as recommended by nephrology.  Patient need to have a close follow-up with his providers including nephrology. Patient is under surgical team so discharge orders will be placed by them.  Assessment and Plan: * Acute kidney injury superimposed on CKD (HCC) Acute kidney injury on CKD stage II.  Creatinine went from 1.35 up to 2.06 on the morning of 12/26.  Patient was given a fluid bolus and creatinine then went up to 2.51 in the afternoon on 12/26.  Will give IV fluid overnight and recheck creatinine tomorrow. 12/27: Worsening creatinine and lower extremity edema.  Echocardiogram with normal EF and grade 2 diastolic dysfunction and a dilated IVC.  Renal ultrasound without any significant abnormality.  Slight improvement in renal function today-concern of contrast related nephropathy. Nephrology would like to keep holding diuretics Renal functions continue to improve, currently  creatinine at 2.15. Patient can resume home Lasix on discharge per nephrology -Monitor renal function -Avoid nephrotoxins  Acute  gangrenous appendicitis Admitted to the surgical service by Dr. Claudine Mouton and taken to the OR on 12/25. -Management per surgical team.  Hypertensive urgency Blood pressure now within goal. Blood pressure initially elevated secondary to pain.  Continue Toprol and Imdur.   Chronic diastolic CHF (congestive heart failure) (HCC) Holding Lasix.  EF 55%.  Grade 2 diastolic dysfunction and dilated IVC. Patient did receive IV fluid with worsening renal function.  Significant lower extremity edema but nephrology would like to hold off to diuretic at this time. Chest x-ray with concern of interstitial edema but patient breathing comfortably on room air. -Compression stockings and lower extremity elevation -Will let nephrology decide about Lasix  Uncontrolled type 2 diabetes mellitus with hyperglycemia, with long-term current use of insulin (HCC) Last hemoglobin A1c 8.0.  CBG currently elevated -Increasing the Semglee to twice daily -Continue SSI  Obesity (BMI 30-39.9) BMI 34.46  Pulmonary nodule Continue outpatient surveillance  Coronary artery disease involving native coronary artery of native heart without angina pectoris s/p cardiac cath 03/21/2022 that showed nonobstructive CAD Patient has no complaints of chest pain   Anxiety and depression Restart BuSpar  Asthma Not acutely exacerbated DuoNebs as needed  HYPOKALEMIA Replaced.  Since holding Lasix I will hold off on further potassium supplementation.  Subjective: Patient was seen and examined today.  Continues to have significant lower extremity edema.  No shortness of breath.  Excited to go home.  Physical Exam: Vitals:   09/29/22 2215 09/30/22 0359 09/30/22 0625 09/30/22 0739  BP: (!) 140/88 (!) 167/98 (!) 142/78 (!) 171/102  Pulse: 72 70 72 78  Resp:  16 16 18   Temp:  97.9 F (36.6 C)  98.1 F (36.7 C)  TempSrc:  Oral  Oral  SpO2:  100% 100% 100%  Weight:  108.4 kg    Height:       General.  Obese gentleman, in  no acute distress. Pulmonary.  Lungs clear bilaterally, normal respiratory effort. CV.  Regular rate and rhythm, no JVD, rub or murmur. Abdomen.  Soft, nontender, nondistended, BS positive. CNS.  Alert and oriented .  No focal neurologic deficit. Extremities.  3+ LE edema, no cyanosis, pulses intact and symmetrical. Psychiatry.  Judgment and insight appears normal.     Data Reviewed: Prior data reviewed  Family Communication: As per general surgery  Disposition: Status is: Inpatient Remains inpatient appropriate because: Severity of illness  Planned Discharge Destination: Home  Time spent: 38 minutes  This record has been created using . Errors have been sought and corrected,but may not always be located. Such creation errors do not reflect on the standard of care.   Author: Conservation officer, historic buildings, MD 09/30/2022 10:48 AM  For on call review [email or web address].

## 2022-09-30 NOTE — TOC Transition Note (Signed)
Transition of Care [facility]) - CM/SW Discharge Note   Patient Details  Name: KHANH TANORI MRN: [id number] Date of Birth: 04-May-1971  Transition of Care [facility]) CM/SW Contact:  Chapman Fitch, RN Phone Number: 09/30/2022, 9:37 AM   Clinical Narrative:    Patient to discharge today RW has been delivered to room Medications to be filled at [facility].  Augmentin will be no cost.  Patient is aware that he will have to pay out of pocket for pain medication.  Patient confirms he can pick up at discharge    Final next level of care: Home/Self Care Barriers to Discharge: Barriers Resolved   Patient Goals and CMS Choice      Discharge Placement                         Discharge Plan and Services Additional resources added to the After Visit Summary for                                       Social Determinants of Health (SDOH) Interventions SDOH Screenings   Depression (PHQ2-9): Low Risk  (05/24/2022)  Recent Concern: Depression (PHQ2-9) - High Risk (03/10/2022)  Tobacco Use: Low Risk  (09/27/2022)     Readmission Risk Interventions    09/27/2022   12:36 PM  Readmission Risk Prevention Plan  Transportation Screening Complete  PCP or Specialist Appt within 3-5 Days Complete  Palliative Care Screening Not Applicable  Medication Review (RN Care Manager) Complete

## 2022-10-05 NOTE — Progress Notes (Unsigned)
[facility] POST-OP OFFICE VISIT  10/06/2022  HPI: Leroy Simmons is a 52 y.o. male 10 days s/p laparoscopic appendectomy for perforated/gangrenous appendicitis.  Discharged with drain on antibiotics.  Drain output quality has been serous.  Notes mild nausea, soft stools.  Denies fevers and chills.  Denies pain.  Vital signs: BP (!) 163/108   Pulse 98   Temp 99 F (37.2 C) (Oral)   Ht 5' 7.75" (1.721 m)   Wt 220 lb (99.8 kg)   SpO2 98%   BMI 33.70 kg/m    Physical Exam: Constitutional: Appears well Abdomen: Soft nontender.  JP exit site from suprapubic area.  I milked the drain, confirmed its function.  I proceeded with its removal.  Dry dressing applied. Skin: All incisions are clean dry and intact.  Assessment/Plan: This is a 52 y.o. male 10 days s/p laparoscopic appendectomy for perforated appendicitis with abscess.  Nearing completion of his postoperative oral antibiotics.  I see no purpose in continuing the drain.  Patient Active Problem List   Diagnosis Date Noted   Obesity (BMI 30-39.9) 09/26/2022   Pre-operative cardiovascular examination 09/26/2022   Heart failure with preserved ejection fraction ([place]) 09/26/2022   Acute gangrenous appendicitis 09/25/2022   Hypertensive urgency 09/25/2022   Uncontrolled type 2 diabetes mellitus with hyperglycemia, with long-term current use of insulin ([place]) 09/25/2022   Chronic diastolic CHF (congestive heart failure) ([place]) 06/02/2022   Pulmonary nodule 06/02/2022   Acute kidney injury superimposed on CKD ([place]) 03/18/2022   Coronary artery disease involving native coronary artery of native heart without angina pectoris 03/18/2022   Acute exacerbation of CHF (congestive heart failure) ([place]) 03/18/2022   Cellulitis of right lower extremity    CHF exacerbation ([place]) 03/17/2022   Anxiety and depression 03/10/2022   Cardiomegaly 01/18/2022   Diabetes mellitus ([place]) 12/07/2021   Hyperlipidemia 12/07/2021   Pain of both  shoulder joints 10/22/2021   Cellulitis of back except buttock 10/01/2020   Asthma 10/26/2019   Cellulitis of left heel 10/26/2019   Chest pain 10/26/2019   Left foot pain 10/26/2019   Cellulitis and abscess of neck 07/13/2019   Abscess of back, except buttock 05/30/2019   HYPOKALEMIA 09/07/2009   MORBID OBESITY 09/07/2009   Anxiety 09/07/2009   Depression 09/07/2009   Essential hypertension 09/07/2009   ASTHMA 09/07/2009   DIARRHEA, CHRONIC 09/07/2009    -We will have him follow-up in 1 month or as needed.  He reports he is seeing Dr. Candiss Norse with nephrology later this afternoon.   Ronny Bacon M.D., FACS 10/06/2022, 9:17 AM

## 2022-10-06 ENCOUNTER — Other Ambulatory Visit
Admission: RE | Admit: 2022-10-06 | Discharge: 2022-10-06 | Disposition: A | Discharge status: Home / Self Care | Payer: Self-pay | Source: Ambulatory Visit | Attending: Nephrology | Admitting: Nephrology

## 2022-10-06 ENCOUNTER — Other Ambulatory Visit: Payer: Self-pay

## 2022-10-06 ENCOUNTER — Encounter: Payer: Self-pay | Admitting: Surgery

## 2022-10-06 ENCOUNTER — Ambulatory Visit (INDEPENDENT_AMBULATORY_CARE_PROVIDER_SITE_OTHER): Payer: Self-pay | Admitting: Surgery

## 2022-10-06 VITALS — BP 163/108 | HR 98 | Temp 99.0°F | Ht 67.75 in | Wt 220.0 lb

## 2022-10-06 DIAGNOSIS — K3531 Acute appendicitis with localized peritonitis and gangrene, without perforation: Secondary | ICD-10-CM

## 2022-10-06 DIAGNOSIS — N179 Acute kidney failure, unspecified: Secondary | ICD-10-CM | POA: Insufficient documentation

## 2022-10-06 DIAGNOSIS — I129 Hypertensive chronic kidney disease with stage 1 through stage 4 chronic kidney disease, or unspecified chronic kidney disease: Secondary | ICD-10-CM | POA: Insufficient documentation

## 2022-10-06 DIAGNOSIS — E1122 Type 2 diabetes mellitus with diabetic chronic kidney disease: Secondary | ICD-10-CM | POA: Insufficient documentation

## 2022-10-06 DIAGNOSIS — R600 Localized edema: Secondary | ICD-10-CM | POA: Insufficient documentation

## 2022-10-06 DIAGNOSIS — Z9049 Acquired absence of other specified parts of digestive tract: Secondary | ICD-10-CM | POA: Insufficient documentation

## 2022-10-06 DIAGNOSIS — N182 Chronic kidney disease, stage 2 (mild): Secondary | ICD-10-CM | POA: Insufficient documentation

## 2022-10-06 DIAGNOSIS — R809 Proteinuria, unspecified: Secondary | ICD-10-CM | POA: Insufficient documentation

## 2022-10-06 DIAGNOSIS — Z09 Encounter for follow-up examination after completed treatment for conditions other than malignant neoplasm: Secondary | ICD-10-CM

## 2022-10-06 LAB — RENAL FUNCTION PANEL
Albumin: 3 g/dL — ABNORMAL LOW (ref 3.5–5.0)
Anion gap: 8 (ref 5–15)
BUN: 20 mg/dL (ref 6–20)
CO2: 28 mmol/L (ref 22–32)
Calcium: 8.7 mg/dL — ABNORMAL LOW (ref 8.9–10.3)
Chloride: 99 mmol/L (ref 98–111)
Creatinine, Ser: 1.33 mg/dL — ABNORMAL HIGH (ref 0.61–1.24)
GFR, Estimated: >60 mL/min (ref >60)
Glucose, Bld: 273 mg/dL — ABNORMAL HIGH (ref 70–99)
Phosphorus: 4.4 mg/dL (ref 2.5–4.6)
Potassium: 3.8 mmol/L (ref 3.5–5.1)
Sodium: 135 mmol/L (ref 135–145)

## 2022-10-06 LAB — HEMOGLOBIN AND HEMATOCRIT, BLOOD
HCT: 43.1 % (ref 39.0–52.0)
Hemoglobin: 13.7 g/dL (ref 13.0–17.0)

## 2022-10-06 LAB — MAGNESIUM: Magnesium: 2 mg/dL (ref 1.7–2.4)

## 2022-10-06 NOTE — Patient Instructions (Signed)
Please keep a dry dressing over the area until the drain site heals completely. Please see your follow up appointment listed below.

## 2022-10-11 ENCOUNTER — Encounter: Payer: Self-pay | Admitting: Surgery

## 2022-10-19 ENCOUNTER — Other Ambulatory Visit: Payer: Self-pay | Admitting: Nephrology

## 2022-10-19 DIAGNOSIS — R600 Localized edema: Secondary | ICD-10-CM

## 2022-10-19 DIAGNOSIS — E1165 Type 2 diabetes mellitus with hyperglycemia: Secondary | ICD-10-CM

## 2022-10-19 DIAGNOSIS — I10 Essential (primary) hypertension: Secondary | ICD-10-CM

## 2022-11-07 NOTE — Progress Notes (Unsigned)
[facility] POST-OP OFFICE VISIT  11/08/2022  HPI: Leroy Simmons is a 53 y.o. male 6 weeks s/p lap appy.  Having no issues, bowel disorder, f/c or n/v.  No abx. No pain or tenderness.   Vital signs: BP (!) 173/94   Pulse (!) 101   Temp 98.2 F (36.8 C) (Oral)   Ht 5' 7.75" (1.721 m)   Wt 227 lb 9.6 oz (103.2 kg)   SpO2 94%   BMI 34.86 kg/m    Physical Exam: Constitutional: appears well, non-toxic at his baseline.   Abdomen: soft and nontender.    Skin: well healed incisions.    Assessment/Plan: This is a 52 y.o. male 6 wks s/p lap appy.   Patient Active Problem List   Diagnosis Date Noted   Status post laparoscopic appendectomy 10/06/2022   Obesity (BMI 30-39.9) 09/26/2022   Pre-operative cardiovascular examination 09/26/2022   Heart failure with preserved ejection fraction ([place]) 09/26/2022   Hypertensive urgency 09/25/2022   Uncontrolled type 2 diabetes mellitus with hyperglycemia, with long-term current use of insulin ([place]) 09/25/2022   Chronic diastolic CHF (congestive heart failure) ([place]) 06/02/2022   Pulmonary nodule 06/02/2022   Acute kidney injury superimposed on CKD ([place]) 03/18/2022   Coronary artery disease involving native coronary artery of native heart without angina pectoris 03/18/2022   Acute exacerbation of CHF (congestive heart failure) ([place]) 03/18/2022   Cellulitis of right lower extremity    CHF exacerbation ([place]) 03/17/2022   Anxiety and depression 03/10/2022   Cardiomegaly 01/18/2022   Diabetes mellitus ([place]) 12/07/2021   Hyperlipidemia 12/07/2021   Pain of both shoulder joints 10/22/2021   Cellulitis of back except buttock 10/01/2020   Asthma 10/26/2019   Cellulitis of left heel 10/26/2019   Chest pain 10/26/2019   Left foot pain 10/26/2019   Cellulitis and abscess of neck 07/13/2019   Abscess of back, except buttock 05/30/2019   HYPOKALEMIA 09/07/2009   MORBID OBESITY 09/07/2009   Anxiety 09/07/2009   Depression 09/07/2009    Essential hypertension 09/07/2009   ASTHMA 09/07/2009   DIARRHEA, CHRONIC 09/07/2009    - f/u as needed.    Ronny Bacon M.D., FACS 11/08/2022, 8:56 AM

## 2022-11-08 ENCOUNTER — Encounter: Payer: Self-pay | Admitting: Surgery

## 2022-11-08 ENCOUNTER — Ambulatory Visit (INDEPENDENT_AMBULATORY_CARE_PROVIDER_SITE_OTHER): Payer: Self-pay | Admitting: Surgery

## 2022-11-08 VITALS — BP 173/94 | HR 101 | Temp 98.2°F | Ht 67.75 in | Wt 227.6 lb

## 2022-11-08 DIAGNOSIS — K3531 Acute appendicitis with localized peritonitis and gangrene, without perforation: Secondary | ICD-10-CM

## 2022-11-08 DIAGNOSIS — Z9049 Acquired absence of other specified parts of digestive tract: Secondary | ICD-10-CM

## 2022-11-08 DIAGNOSIS — Z09 Encounter for follow-up examination after completed treatment for conditions other than malignant neoplasm: Secondary | ICD-10-CM

## 2022-11-08 NOTE — Patient Instructions (Signed)
If you have any concerns or questions, please feel free to call our office. Follow up as needed.   Laparoscopic Appendectomy, Adult, Care After What can I expect after the procedure? After a laparoscopic appendectomy, it is common to have: Little energy for normal activities. Mild pain in the area where the cuts from surgery (incisions) were made. Trouble pooping (constipation). This may be caused by: Pain medicine. A lack of activity. Gas pain that can spread to your shoulders. Follow these instructions at home: Your doctor may give you more instructions. If you have problems, contact your doctor. Medicines Take over-the-counter and prescription medicines only as told by your doctor. Do not stop taking antibiotics even if you start to feel better. Take pain medicines before your pain gets very bad. If told, take steps to prevent problems with pooping (constipation). You may need to: Drink enough fluid to keep your pee (urine) pale yellow. Take medicines. You will be told what medicines to take. Eat foods that are high in fiber. These include beans, whole grains, and fresh fruits and vegetables. Limit foods that are high in fat and sugar. These include fried or sweet foods. Ask your doctor if you should avoid driving or using machines while you are taking your medicine. Incision care  Follow instructions from your doctor about how to take care of your incisions. Make sure you: Wash your hands with soap and water for at least 20 seconds before and after you change your bandage. If you cannot use soap and water, use hand sanitizer. Change your bandage. Leave stitches, staples, or skin glue in place for at least 2 weeks. Leave tape strips alone unless you are told to take them off. You may trim the edges of the tape strips if they curl up. Check your incisions every day for signs of infection. Check for: Redness, swelling, or pain. Fluid or blood. Warmth. Pus or a bad  smell. Bathing Do not take baths, swim, or use a hot tub. Ask your doctor about taking showers or sponge baths. Keep your incisions clean and dry. Clean them as told by your doctor. To do this: Gently wash the incisions with soap and water. Rinse the incisions with water to get all the soap off. Pat the incisions dry with a clean towel. Do not rub the incisions. Activity  If you were given a sedative during your procedure, do not drive or use machines until your doctor says that it is safe. A sedative is a medicine that helps you relax. Rest as told by your doctor. Do not lift anything that is heavier than 10 lb (4.5 kg). Do not play contact sports until your doctor says that it is safe. Return to your normal activities when your doctor says that it is safe. General instructions If you were sent home with a drain, follow instructions from your doctor on how to care for it. Take deep breaths. This helps to keep your lungs from getting an infection (pneumonia). If you need to cough or sneeze, place a pillow or blanket on your belly before you do so. This will help you control the pain. Keep all follow-up visits. Contact a doctor if: You have any of these signs of infection in an incision: Redness, swelling, or pain. Fluid or blood. Warmth. Pus or a bad smell. Your incisions open after the stitches have been taken out. You lose your appetite. You feel as if you may vomit. You vomit. You get watery poop (diarrhea), or you cannot  control your poop. You get a rash. Get help right away if: You have a fever. You have trouble breathing. You have sharp pains in your chest. You have swelling or pain in your legs. You faint. These symptoms may be an emergency. Get help right away. Call 911. Do not wait to see if the symptoms will go away. Do not drive yourself to the hospital. Summary After the procedure, it is common to have low energy, mild pain, trouble pooping, and gas pain. Follow  your doctor's instructions about caring for yourself after the procedure. Rest after the procedure. Return to your normal activities as told by your doctor. Contact your doctor if you see signs of infection around your incisions. Get help right away if you have a fever, chest pain, or trouble breathing. This information is not intended to replace advice given to you by your health care provider. Make sure you discuss any questions you have with your health care provider. Document Revised: 07/01/2021 Document Reviewed: 07/01/2021 Elsevier Patient Education  [place].

## 2022-12-07 ENCOUNTER — Ambulatory Visit: Payer: Self-pay

## 2022-12-07 NOTE — Telephone Encounter (Signed)
     Chief Complaint: Mold exposure with breathing  issues. SOB, cough, runny nose, skin irritation. Will go to [facility]. Will call back for [facility] care clinics phone numbers. Symptoms: Above. Frequency:  Months ago Pertinent Negatives: Patient denies  Disposition: '[]'$ ED /'[x]'$ Urgent Care (no appt availability in office) / '[]'$ Appointment(In office/virtual)/ '[]'$  [place] [facility]/ '[]'$ Home Care/ '[]'$ Refused Recommended Disposition /'[]'$  Mobile Bus/ '[]'$  Follow-up with PCP Additional Notes:   Reason for Disposition  [1] MILD difficulty breathing (e.g., minimal/no SOB at rest, SOB with walking, pulse <100) AND [2] NEW-onset or WORSE than normal  Answer Assessment - Initial Assessment Questions 1. RESPIRATORY STATUS: "Describe your breathing?" (e.g., wheezing, shortness of breath, unable to speak, severe coughing)      SOB 2. ONSET: "When did this breathing problem begin?"      Months 3. PATTERN "Does the difficult breathing come and go, or has it been constant since it started?"      Comes and goes 4. SEVERITY: "How bad is your breathing?" (e.g., mild, moderate, severe)    - MILD: No SOB at rest, mild SOB with walking, speaks normally in sentences, can lie down, no retractions, pulse < 100.    - MODERATE: SOB at rest, SOB with minimal exertion and prefers to sit, cannot lie down flat, speaks in phrases, mild retractions, audible wheezing, pulse 100-120.    - SEVERE: Very SOB at rest, speaks in single words, struggling to breathe, sitting hunched forward, retractions, pulse > 120      Mild 5. RECURRENT SYMPTOM: "Have you had difficulty breathing before?" If Yes, ask: "When was the last time?" and "What happened that time?"      Yes 6. CARDIAC HISTORY: "Do you have any history of heart disease?" (e.g., heart attack, angina, bypass surgery, angioplasty)      No 7. LUNG HISTORY: "Do you have any history of lung disease?"  (e.g., pulmonary embolus, asthma, emphysema)     No 8. CAUSE:  "What do you think is causing the breathing problem?"      Mold exposure 9. OTHER SYMPTOMS: "Do you have any other symptoms? (e.g., dizziness, runny nose, cough, chest pain, fever)     Runny nose,cough 10. O2 SATURATION MONITOR:  "Do you use an oxygen saturation monitor (pulse oximeter) at home?" If Yes, ask: "What is your reading (oxygen level) today?" "What is your usual oxygen saturation reading?" (e.g., 95%)       No 11. PREGNANCY: "Is there any chance you are pregnant?" "When was your last menstrual period?"       N.a 12. TRAVEL: "Have you traveled out of the country in the last month?" (e.g., travel history, exposures)       no  Protocols used: Breathing Difficulty-A-AH

## 2022-12-14 ENCOUNTER — Encounter (HOSPITAL_BASED_OUTPATIENT_CLINIC_OR_DEPARTMENT_OTHER): Payer: Self-pay

## 2022-12-22 ENCOUNTER — Emergency Department: Payer: Self-pay

## 2022-12-22 ENCOUNTER — Emergency Department
Admission: EM | Admit: 2022-12-22 | Discharge: 2022-12-22 | Disposition: A | Discharge status: Home / Self Care | Payer: Self-pay | Attending: Emergency Medicine | Admitting: Emergency Medicine

## 2022-12-22 DIAGNOSIS — I110 Hypertensive heart disease with heart failure: Secondary | ICD-10-CM | POA: Insufficient documentation

## 2022-12-22 DIAGNOSIS — Z77120 Contact with and (suspected) exposure to mold (toxic): Secondary | ICD-10-CM | POA: Insufficient documentation

## 2022-12-22 DIAGNOSIS — B86 Scabies: Secondary | ICD-10-CM | POA: Insufficient documentation

## 2022-12-22 DIAGNOSIS — I509 Heart failure, unspecified: Secondary | ICD-10-CM | POA: Insufficient documentation

## 2022-12-22 DIAGNOSIS — E119 Type 2 diabetes mellitus without complications: Secondary | ICD-10-CM | POA: Insufficient documentation

## 2022-12-22 DIAGNOSIS — R911 Solitary pulmonary nodule: Secondary | ICD-10-CM | POA: Insufficient documentation

## 2022-12-22 MED ORDER — HYDROXYZINE HCL 10 MG PO TABS: 10.0000 mg | ORAL_TABLET | Freq: Three times a day (TID) | ORAL | 0 refills | Status: DC | PRN

## 2022-12-22 MED ORDER — MUPIROCIN 2 % EX OINT: 1.0000 | TOPICAL_OINTMENT | Freq: Two times a day (BID) | CUTANEOUS | 0 refills | Status: DC

## 2022-12-22 MED ORDER — ALBUTEROL SULFATE HFA 108 (90 BASE) MCG/ACT IN AERS: 2.0000 | INHALATION_SPRAY | Freq: Four times a day (QID) | RESPIRATORY_TRACT | 2 refills | Status: DC | PRN

## 2022-12-22 MED ORDER — PERMETHRIN 5 % EX CREA: 1.0000 | TOPICAL_CREAM | Freq: Once | CUTANEOUS | 1 refills | Status: AC

## 2022-12-22 NOTE — Discharge Instructions (Signed)
Follow-up with your regular doctor as needed.  Follow-up with the pulmonary nodule clinic.  They will call you for an appointment.  Apply the cream to your skin.  Follow-up with [facility] if not improving with this medication

## 2022-12-22 NOTE — ED Triage Notes (Signed)
Pt sts that he believes that he has been exposed to mold to an extended period of time. Pt sts that she has been having brain fog which can be from the mold as well as rashes that have been on his body.

## 2022-12-22 NOTE — ED Provider Notes (Signed)
[facility] Provider Note    Event Date/Time   First MD Initiated Contact with Patient 12/22/22 1641     (approximate)   History   Shortness of Breath   HPI  Leroy Simmons is a 52 y.o. male with history of hypertension, CHF, diabetes, anxiety, presents emergency department with concerns of difficulty breathing due to mold exposure possibly.  Patient also has a rash.  States that he would stay at his friend's house that was recently condemned.  States his symptoms were worse when he was at the house.  He would like to have information or proof so that he can have the landlord pay for his medical bills due to staying at his friend's house.  Patient states he has his own apartment and is breathing better since he stays there for only.  Denies fever or chills.  No chest pain or shortness of breath.  States his friend also has the same rash and is itching.  They both itch more at night.  States I do not think it is bedbugs as I have had those before      Physical Exam   Triage Vital Signs: ED Triage Vitals  Enc Vitals Group     BP 12/22/22 1542 (!) 175/106     Pulse Rate 12/22/22 1542 96     Resp 12/22/22 1542 17     Temp 12/22/22 1542 98.6 F (37 C)     Temp Source 12/22/22 1542 Oral     SpO2 12/22/22 1542 97 %     Weight 12/22/22 1540 225 lb (102.1 kg)     Height --      Head Circumference --      Peak Flow --      Pain Score 12/22/22 1540 4     Pain Loc --      Pain Edu? --      Excl. in [place]? --     Most recent vital signs: Vitals:   12/22/22 1542  BP: (!) 175/106  Pulse: 96  Resp: 17  Temp: 98.6 F (37 C)  SpO2: 97%     General: Awake, no distress.   CV:  Good peripheral perfusion. regular rate and  rhythm Resp:  Normal effort. Lungs cta Abd:  No distention.   Other:      ED Results / Procedures / Treatments   Labs (all labs ordered are listed, but only abnormal results are displayed) Labs Reviewed - No data to  display   EKG     RADIOLOGY Chest x-ray    PROCEDURES:   Procedures   MEDICATIONS ORDERED IN ED: Medications - No data to display   IMPRESSION / MDM / [place] / ED COURSE  I reviewed the triage vital signs and the nursing notes.                              Differential diagnosis includes, but is not limited to, asthma, CHF, mold exposure, scabies, bedbugs  Patient's presentation is most consistent with acute complicated illness / injury requiring diagnostic workup.   The patient's chest x-ray was independently reviewed and interpreted by me as having a pulmonary nodule.  Patient has a history of the same most still would like to have him follow-up to have this evaluated.  Needs to be evaluated via CT.  He is understanding the instructions for this.  I do not feel that  the rash is actually bedbugs but I do feel it might be scabies.  Patient is trying to related to the mold exposure but I am not sure that you would get a rash that has pustules on it from mold.  I instructed him to do a trial of Acticin cream to eliminate scabies.  If he gets better with this medication he can repeat it in 1 week.  His friend would also need to be treated.  He was given a prescription for an albuterol inhaler to help him with his difficulty breathing.  Acticin cream for the scabies, Atarax for itching.  Bactroban ointment to apply to the pustules.  He is in agreement treatment plan at this time.  He is to follow-up with his regular doctor, dermatology, and pulmonology.  Discharged in stable condition.      FINAL CLINICAL IMPRESSION(S) / ED DIAGNOSES   Final diagnoses:  Pulmonary nodule  Scabies  Mold suspected exposure     Rx / DC Orders   ED Discharge Orders          Ordered    AMB  Referral to Pulmonary Nodule Clinic        12/22/22 1705    albuterol (VENTOLIN HFA) 108 (90 Base) MCG/ACT inhaler  Every 6 hours PRN        12/22/22 1707    permethrin (ELIMITE) 5 %  cream   Once        12/22/22 1707    mupirocin ointment (BACTROBAN) 2 %  2 times daily        12/22/22 1707    hydrOXYzine (ATARAX) 10 MG tablet  3 times daily PRN        12/22/22 1707             Note:  This document was prepared using Dragon voice recognition software and may include unintentional dictation errors.    Versie Starks, PA-C 12/22/22 1728    Carrie Mew, MD 12/22/22 [phone]

## 2023-01-03 IMAGING — CT CT HEART MORP W/ CTA COR W/ SCORE W/ CA W/CM &/OR W/O CM
1 series · 4 of 6 positions shown, 5 images · IV contrast (omnipaque)
Comparison: None Available.
COMPARISON: None Available.

Addendum:
EXAM:
OVER-READ INTERPRETATION  CT CHEST

The following report is a limited chest CT over-read performed by
03/18/2022. The coronary calcium score and cardiac CTA interpretation
by the cardiologist is attached.
HISTORY: 50 yo male with chest pain/anginal equiv, intermediate CAD risk, not
treadmill candidate
Cardiac/Coronary CTA
TECHNIQUE: The patient was scanned on a Siemens Force scanner.
PROTOCOL: A 120 kV prospective scan was triggered in the descending thoracic
aorta at 111 HU's. Axial non-contrast 3 mm slices were carried out
through the heart. The data set was analyzed on a dedicated work
station and scored using the Agatson method. Gantry rotation speed
was 250 msecs and collimation was .6 mm. Beta blockade and 0.8 mg of
sl NTG was given. The 3D data set was reconstructed in 5% intervals
of the 35-75 % of the R-R cycle. Diastolic phases were analyzed on a
dedicated work station using MPR, MIP and VRT modes. The patient
received 80mL OMNIPAQUE IOHEXOL 350 MG/ML SOLN of contrast.

[Series 1035: coronaries · 4 of 6 slices shown, 5 images]
[im 2/6  vessel]
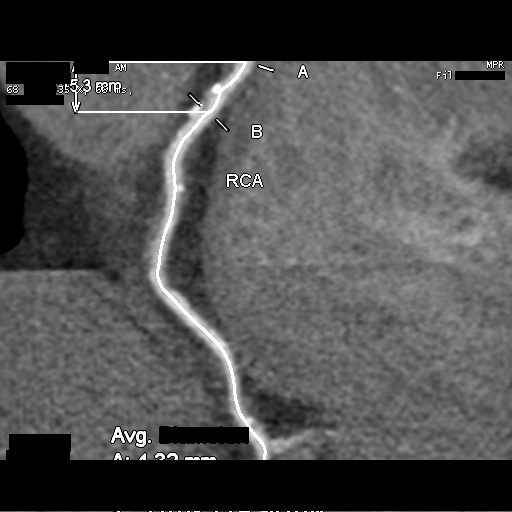
[im 2/6  lung]
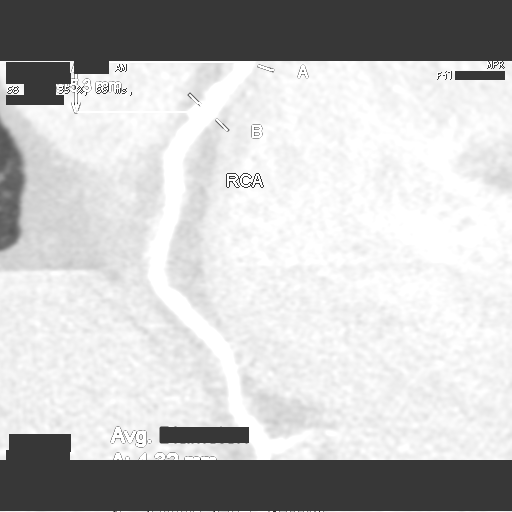
[im 3/6  vessel]
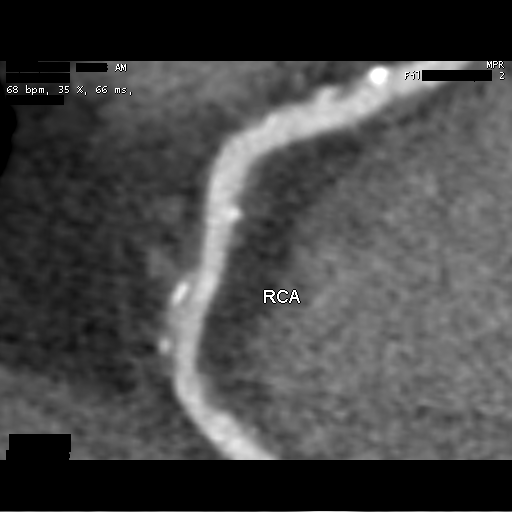
[im 4/6  vessel]
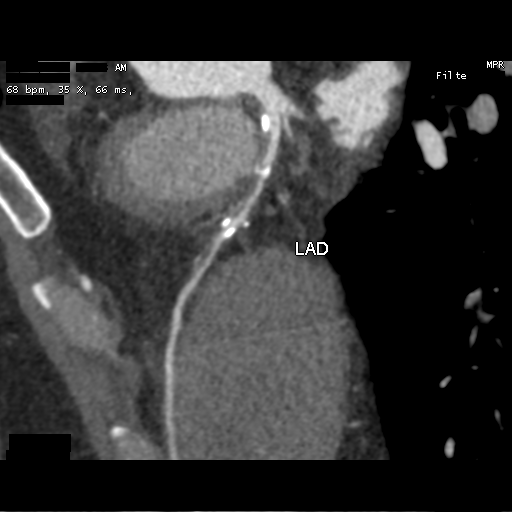
[im 5/6  vessel]
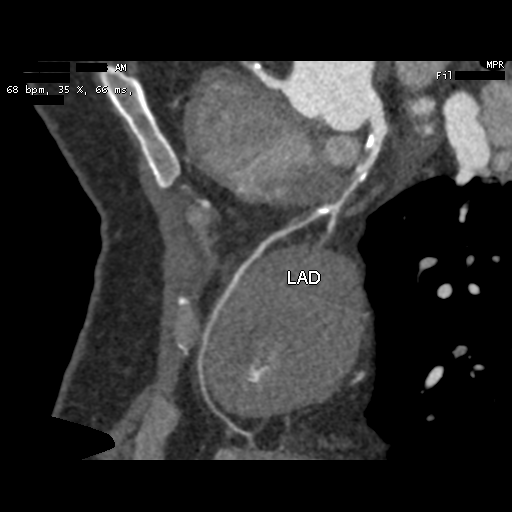

[4 of 6 positions shown; findings below may reference images not displayed]

FINDINGS: Small left lower lobe pulmonary nodule measuring 8 mm abutting the
left hemidiaphragm (axial image 42 of series 11). Within the
visualized portions of the thorax there is no acute consolidative
airspace disease, no pleural effusions, no pneumothorax and no
lymphadenopathy. Visualized portions of the upper abdomen are
unremarkable. There are no aggressive appearing lytic or blastic
lesions noted in the visualized portions of the skeleton.
IMPRESSION: 1. 8 mm left lower lobe pulmonary nodule. Non-contrast chest CT at
6-12 months is recommended. If the nodule is stable at time of
repeat CT, then future CT at 18-24 months (from today's scan) is
considered optional for low-risk patients, but is recommended for
high-risk patients. This recommendation follows the consensus
statement: Guidelines for Management of Incidental Pulmonary Nodules
Detected on CT Images: From the [HOSPITAL] 9805; Radiology
FINDINGS: Quality: Good, HR 68

Coronary calcium score: The patient's coronary artery calcium score
is 431, which places the patient in the 98th percentile.

Coronary arteries: Normal coronary origins.  Right dominance.

Right Coronary Artery: Dominant. Mild mixed proximal 25-49% stenosis
([id number]). There is plaque at the crux with positive remodeling and
spotty calcification. There is minimal 1-24% mixed distal vessel
stenosis.

Left Main Coronary Artery: Normal. Bifurcates into the LAD and LCx
arteries.

Left Anterior Descending Coronary Artery: Anterior artery which
wraps around the apex. There is eccentric ostial calcification and
spotty proximal calcification with associated low attenuation plaque
and 70-99% stenoisis ([id number]) - the LAD measures 1.02 mm at its
smallest point. The proximal segment of atherosclerosis extends 50
mm from the LAD/LCX bifurcation. D1 branch has minimal mixed
proximal disease.

Left Circumflex Artery: Lateral wall vessel. There is a mild 25-49%
non-calcified stenosis of the proximal vessel ([id number]).

Aorta: Normal size, 32 mm at the mid ascending aorta (level of the
PA bifurcation) measured double oblique. No calcifications. No
dissection.

Aortic Valve: Trileaflet. No calcifications.

Other findings:

Normal pulmonary vein drainage into the left atrium.

Normal left atrial appendage without a thrombus.

Normal size of the pulmonary artery.
IMPRESSION: 1. Severe mixed stenosis/low attenuation plaque of the proximal LAD,
CADRADS = 4a/HRP. CT FFR will be performed and reported separately.

2. Coronary calcium score of 431. This was 98th percentile for age
and sex matched control.

3. Normal coronary origin with right dominance.

4. Definitive cardiac catheterization is recommended.

*** End of Addendum ***
EXAM:
OVER-READ INTERPRETATION  CT CHEST

The following report is a limited chest CT over-read performed by
03/18/2022. The coronary calcium score and cardiac CTA interpretation
by the cardiologist is attached.
FINDINGS: Small left lower lobe pulmonary nodule measuring 8 mm abutting the
left hemidiaphragm (axial image 42 of series 11). Within the
visualized portions of the thorax there is no acute consolidative
airspace disease, no pleural effusions, no pneumothorax and no
lymphadenopathy. Visualized portions of the upper abdomen are
unremarkable. There are no aggressive appearing lytic or blastic
lesions noted in the visualized portions of the skeleton.
IMPRESSION: 1. 8 mm left lower lobe pulmonary nodule. Non-contrast chest CT at
6-12 months is recommended. If the nodule is stable at time of
repeat CT, then future CT at 18-24 months (from today's scan) is
considered optional for low-risk patients, but is recommended for
high-risk patients. This recommendation follows the consensus
statement: Guidelines for Management of Incidental Pulmonary Nodules
Detected on CT Images: From the [HOSPITAL] 9805; Radiology

## 2023-01-13 ENCOUNTER — Ambulatory Visit (INDEPENDENT_AMBULATORY_CARE_PROVIDER_SITE_OTHER): Payer: 59 | Admitting: Emergency Medicine

## 2023-01-13 ENCOUNTER — Encounter: Payer: Self-pay | Admitting: Emergency Medicine

## 2023-01-13 VITALS — BP 140/72 | HR 93 | Ht 68.0 in | Wt 235.2 lb

## 2023-01-13 DIAGNOSIS — R911 Solitary pulmonary nodule: Secondary | ICD-10-CM | POA: Diagnosis not present

## 2023-01-13 DIAGNOSIS — J4520 Mild intermittent asthma, uncomplicated: Secondary | ICD-10-CM

## 2023-01-13 MED ORDER — ALBUTEROL SULFATE HFA 108 (90 BASE) MCG/ACT IN AERS: 2.0000 | INHALATION_SPRAY | Freq: Four times a day (QID) | RESPIRATORY_TRACT | 2 refills | Status: DC | PRN

## 2023-01-13 NOTE — Assessment & Plan Note (Signed)
He describes intermittent dyspnea that is associated with chest tightness, wheezing.  Does not always respond to albuterol but symptoms are consistent with asthma.  He had asthma as a teen and mostly grew out of it.  We will arrange for formal pulmonary function testing and quantify his degree of obstruction.  If indicated we will start bronchodilator therapy, ICS.

## 2023-01-13 NOTE — Progress Notes (Signed)
Subjective:    Patient ID: Leroy Simmons, male    DOB: November 15, 1970, 52 y.o.   MRN: [id number]  HPI 52 year old gentleman, never smoker, with a history of diabetes, hypertension, appendicitis 09/2022.  During that admission he had a CT scan of his abdomen and pelvis that showed a 9 mm subpleural left lower lobe pulmonary nodule that was stable going back to comparison cardiac CT chest done 03/18/2022. He has been having some wheeze, some exertional SOB and tightness. He sometimes responds to albuterol but not always. Minimal cough or sputum.    Review of Systems As per HPi  Past Medical History:  Diagnosis Date   Acute gangrenous appendicitis 09/25/2022   Anxiety    CHF (congestive heart failure)    Depression    Diabetes mellitus without complication    Hypertension      Family History  Problem Relation Age of Onset   Heart failure Mother     No hx lung CA  Social History   Socioeconomic History   Marital status: Single    Spouse name: Not on file   Number of children: Not on file   Years of education: Not on file   Highest education level: Not on file  Occupational History   Occupation: retail  Tobacco Use   Smoking status: Never   Smokeless tobacco: Never  Vaping Use   Vaping Use: Never used  Substance and Sexual Activity   Alcohol use: Yes    Comment: rarely   Drug use: Never   Sexual activity: Not on file  Other Topics Concern   Not on file  Social History Narrative   Not on file   Social Determinants of Health   Financial Resource Strain: Not on file  Food Insecurity: Not on file  Transportation Needs: Not on file  Physical Activity: Not on file  Stress: Not on file  Social Connections: Not on file  Intimate Partner Violence: Not on file    May have had some mold exposure in his home  Exposed to traffic fumes through work No [employer] Has lived in [place], [place], [place]   No Known Allergies   Outpatient Medications Prior to Visit  Medication Sig  Dispense Refill   albuterol (VENTOLIN HFA) 108 (90 Base) MCG/ACT inhaler Inhale 2 puffs into the lungs every 6 (six) hours as needed for wheezing or shortness of breath. 8 g 2   amLODipine (NORVASC) 5 MG tablet Take 1 tablet (5 mg total) by mouth daily. 90 tablet 1   blood glucose meter kit and supplies Dispense based on patient and insurance preference. Use up to four times daily as directed. (FOR ICD-10 E10.9, E11.9). 1 each 0   busPIRone (BUSPAR) 5 MG tablet TAKE 1 & 1/2 (ONE & ONE-HALF) TABLETS BY MOUTH TWICE DAILY 90 tablet 0   furosemide (LASIX) 40 MG tablet Take 1 tablet (40 mg total) by mouth daily.     HYDROcodone-acetaminophen (NORCO/VICODIN) 5-325 MG tablet Take 1-2 tablets by mouth every 4 (four) hours as needed for moderate pain. 20 tablet 0   hydrOXYzine (ATARAX) 10 MG tablet Take 1 tablet (10 mg total) by mouth 3 (three) times daily as needed. 30 tablet 0   insulin glargine (LANTUS) 100 unit/mL SOPN Inject 26 Units into the skin daily. (Patient taking differently: Inject 26 Units into the skin in the morning.) 15 mL 1   Insulin Pen Needle (PEN NEEDLES 3/16") 31G X 5 MM MISC 1 each by Does not apply route  daily. 90 each 1   isosorbide mononitrate (IMDUR) 30 MG 24 hr tablet Take 0.5 tablets (15 mg total) by mouth daily. 30 tablet 3   JARDIANCE 10 MG TABS tablet Take 1 tablet (10 mg total) by mouth every morning. 90 tablet 1   metFORMIN (GLUCOPHAGE-XR) 500 MG 24 hr tablet Take 4 tablets (2,000 mg total) by mouth at bedtime. (Patient taking differently: Take 1,000 mg by mouth at bedtime.) 360 tablet 1   metoprolol succinate (TOPROL-XL) 50 MG 24 hr tablet Take 1 tablet (50 mg total) by mouth daily. Take with or immediately following a meal. (Patient taking differently: Take 50 mg by mouth daily.) 30 tablet 0   mupirocin ointment (BACTROBAN) 2 % Apply 1 Application topically 2 (two) times daily. 22 g 0   sertraline (ZOLOFT) 25 MG tablet Take 1 tablet (25 mg total) by mouth daily. 30 tablet 1    rosuvastatin (CRESTOR) 20 MG tablet Take 1 tablet (20 mg total) by mouth daily. 90 tablet 0   No facility-administered medications prior to visit.         Objective:   Physical Exam  Vitals:   01/13/23 1448  BP: (!) 140/72  Pulse: 93  SpO2: 97%  Weight: 235 lb 3.2 oz (106.7 kg)  Height:  (1.727 m)   Gen: Pleasant, well-nourished, in no distress,  normal affect  ENT: No lesions,  mouth clear,  oropharynx clear, no postnasal drip  Neck: No JVD, no stridor  Lungs: No use of accessory muscles, no crackles or wheezing on normal respiration, no wheeze on forced expiration  Cardiovascular: RRR, heart sounds normal, no murmur or gallops, 2+ bilateral lower extremity edema  Musculoskeletal: No deformities, no cyanosis or clubbing  Neuro: alert, awake, non focal  Skin: Warm, no lesions or rash     Assessment & Plan:  Asthma He describes intermittent dyspnea that is associated with chest tightness, wheezing.  Does not always respond to albuterol but symptoms are consistent with asthma.  He had asthma as a teen and mostly grew out of it.  We will arrange for formal pulmonary function testing and quantify his degree of obstruction.  If indicated we will start bronchodilator therapy, ICS.  Pulmonary nodule Low risk patient.  Left lower lobe pulmonary nodule that dates back to at least 03/18/2022.  I do not see it on a remote abdominal scan from 2011.  Most recent imaging was 12/23.  We will repeat his CT chest this December.  If there is an interval change we will decide if any diagnostics are indicated.   Levy Pupa, MD, PhD 01/13/2023, 3:26 PM [facility] [phone] or if no answer before 7:00PM call [phone] For any issues after 7:00PM please call eLink [phone]

## 2023-01-13 NOTE — Assessment & Plan Note (Signed)
Low risk patient.  Left lower lobe pulmonary nodule that dates back to at least 03/18/2022.  I do not see it on a remote abdominal scan from 2011.  Most recent imaging was 12/23.  We will repeat his CT chest this December.  If there is an interval change we will decide if any diagnostics are indicated.

## 2023-01-13 NOTE — Addendum Note (Signed)
Addended by: Dorisann Frames R on: 01/13/2023 03:50 PM   Modules accepted: Orders

## 2023-01-13 NOTE — Patient Instructions (Signed)
We will perform pulmonary function testing in next office visit. Keep your albuterol available to use 2 puffs if you needed for shortness of breath, chest tightness, wheezing. We will repeat your CT scan of the chest in December 2024 to compare with your priors. Follow Dr. Delton Coombes next available with PFT on the same day

## 2023-01-13 NOTE — Addendum Note (Signed)
Addended by: Dorisann Frames R on: 01/13/2023 03:59 PM   Modules accepted: Orders

## 2023-01-18 ENCOUNTER — Emergency Department (HOSPITAL_COMMUNITY): Payer: 59

## 2023-01-18 ENCOUNTER — Inpatient Hospital Stay (HOSPITAL_COMMUNITY)
Admission: EM | Admit: 2023-01-18 | Discharge: 2023-01-24 | DRG: 291 | Disposition: A | Discharge status: Home / Self Care | Payer: 59 | Attending: Internal Medicine | Admitting: Internal Medicine

## 2023-01-18 ENCOUNTER — Other Ambulatory Visit: Payer: Self-pay

## 2023-01-18 ENCOUNTER — Encounter (HOSPITAL_COMMUNITY): Payer: Self-pay | Admitting: Emergency Medicine

## 2023-01-18 ENCOUNTER — Inpatient Hospital Stay (HOSPITAL_COMMUNITY): Payer: 59

## 2023-01-18 DIAGNOSIS — I82452 Acute embolism and thrombosis of left peroneal vein: Secondary | ICD-10-CM | POA: Diagnosis not present

## 2023-01-18 DIAGNOSIS — Z79899 Other long term (current) drug therapy: Secondary | ICD-10-CM | POA: Diagnosis not present

## 2023-01-18 DIAGNOSIS — E871 Hypo-osmolality and hyponatremia: Secondary | ICD-10-CM | POA: Diagnosis not present

## 2023-01-18 DIAGNOSIS — N1832 Chronic kidney disease, stage 3b: Secondary | ICD-10-CM | POA: Diagnosis present

## 2023-01-18 DIAGNOSIS — Z7984 Long term (current) use of oral hypoglycemic drugs: Secondary | ICD-10-CM

## 2023-01-18 DIAGNOSIS — E1159 Type 2 diabetes mellitus with other circulatory complications: Secondary | ICD-10-CM | POA: Diagnosis present

## 2023-01-18 DIAGNOSIS — N189 Chronic kidney disease, unspecified: Secondary | ICD-10-CM | POA: Diagnosis present

## 2023-01-18 DIAGNOSIS — E785 Hyperlipidemia, unspecified: Secondary | ICD-10-CM | POA: Diagnosis not present

## 2023-01-18 DIAGNOSIS — I2510 Atherosclerotic heart disease of native coronary artery without angina pectoris: Secondary | ICD-10-CM | POA: Diagnosis present

## 2023-01-18 DIAGNOSIS — I152 Hypertension secondary to endocrine disorders: Secondary | ICD-10-CM | POA: Diagnosis present

## 2023-01-18 DIAGNOSIS — I5033 Acute on chronic diastolic (congestive) heart failure: Secondary | ICD-10-CM | POA: Diagnosis not present

## 2023-01-18 DIAGNOSIS — R42 Dizziness and giddiness: Secondary | ICD-10-CM | POA: Diagnosis not present

## 2023-01-18 DIAGNOSIS — M7989 Other specified soft tissue disorders: Secondary | ICD-10-CM | POA: Diagnosis not present

## 2023-01-18 DIAGNOSIS — Z6836 Body mass index (BMI) 36.0-36.9, adult: Secondary | ICD-10-CM

## 2023-01-18 DIAGNOSIS — I10 Essential (primary) hypertension: Secondary | ICD-10-CM | POA: Diagnosis present

## 2023-01-18 DIAGNOSIS — E1165 Type 2 diabetes mellitus with hyperglycemia: Secondary | ICD-10-CM | POA: Insufficient documentation

## 2023-01-18 DIAGNOSIS — I2489 Other forms of acute ischemic heart disease: Secondary | ICD-10-CM | POA: Diagnosis present

## 2023-01-18 DIAGNOSIS — E1122 Type 2 diabetes mellitus with diabetic chronic kidney disease: Secondary | ICD-10-CM | POA: Diagnosis present

## 2023-01-18 DIAGNOSIS — F32A Depression, unspecified: Secondary | ICD-10-CM | POA: Diagnosis not present

## 2023-01-18 DIAGNOSIS — K759 Inflammatory liver disease, unspecified: Secondary | ICD-10-CM | POA: Insufficient documentation

## 2023-01-18 DIAGNOSIS — R0602 Shortness of breath: Secondary | ICD-10-CM | POA: Diagnosis not present

## 2023-01-18 DIAGNOSIS — K760 Fatty (change of) liver, not elsewhere classified: Secondary | ICD-10-CM | POA: Diagnosis not present

## 2023-01-18 DIAGNOSIS — R601 Generalized edema: Secondary | ICD-10-CM | POA: Insufficient documentation

## 2023-01-18 DIAGNOSIS — E669 Obesity, unspecified: Secondary | ICD-10-CM | POA: Diagnosis present

## 2023-01-18 DIAGNOSIS — Z91148 Patient's other noncompliance with medication regimen for other reason: Secondary | ICD-10-CM | POA: Diagnosis not present

## 2023-01-18 DIAGNOSIS — Z794 Long term (current) use of insulin: Secondary | ICD-10-CM

## 2023-01-18 DIAGNOSIS — I130 Hypertensive heart and chronic kidney disease with heart failure and stage 1 through stage 4 chronic kidney disease, or unspecified chronic kidney disease: Principal | ICD-10-CM | POA: Diagnosis present

## 2023-01-18 DIAGNOSIS — R9431 Abnormal electrocardiogram [ECG] [EKG]: Secondary | ICD-10-CM | POA: Diagnosis not present

## 2023-01-18 DIAGNOSIS — D649 Anemia, unspecified: Secondary | ICD-10-CM | POA: Diagnosis not present

## 2023-01-18 DIAGNOSIS — N179 Acute kidney failure, unspecified: Secondary | ICD-10-CM | POA: Diagnosis present

## 2023-01-18 DIAGNOSIS — I509 Heart failure, unspecified: Secondary | ICD-10-CM | POA: Diagnosis not present

## 2023-01-18 DIAGNOSIS — E876 Hypokalemia: Secondary | ICD-10-CM | POA: Diagnosis present

## 2023-01-18 DIAGNOSIS — E1169 Type 2 diabetes mellitus with other specified complication: Secondary | ICD-10-CM | POA: Diagnosis present

## 2023-01-18 DIAGNOSIS — F419 Anxiety disorder, unspecified: Secondary | ICD-10-CM | POA: Diagnosis present

## 2023-01-18 DIAGNOSIS — R7989 Other specified abnormal findings of blood chemistry: Secondary | ICD-10-CM | POA: Diagnosis not present

## 2023-01-18 DIAGNOSIS — Z8249 Family history of ischemic heart disease and other diseases of the circulatory system: Secondary | ICD-10-CM | POA: Diagnosis not present

## 2023-01-18 DIAGNOSIS — I214 Non-ST elevation (NSTEMI) myocardial infarction: Secondary | ICD-10-CM | POA: Diagnosis not present

## 2023-01-18 DIAGNOSIS — R079 Chest pain, unspecified: Secondary | ICD-10-CM | POA: Diagnosis not present

## 2023-01-18 DIAGNOSIS — I160 Hypertensive urgency: Secondary | ICD-10-CM | POA: Diagnosis not present

## 2023-01-18 DIAGNOSIS — I82451 Acute embolism and thrombosis of right peroneal vein: Secondary | ICD-10-CM | POA: Diagnosis not present

## 2023-01-18 LAB — CBC WITH DIFFERENTIAL/PLATELET
Abs Immature Granulocytes: 0.04 10*3/uL (ref 0.00–0.07)
Basophils Absolute: 0.1 10*3/uL (ref 0.0–0.1)
Basophils Relative: 1 %
Eosinophils Absolute: 0.3 10*3/uL (ref 0.0–0.5)
Eosinophils Relative: 4 %
HCT: 39.3 % (ref 39.0–52.0)
Hemoglobin: 13.6 g/dL (ref 13.0–17.0)
Immature Granulocytes: 1 %
Lymphocytes Relative: 30 %
Lymphs Abs: 2.4 10*3/uL (ref 0.7–4.0)
MCH: 30.2 pg (ref 26.0–34.0)
MCHC: 34.6 g/dL (ref 30.0–36.0)
MCV: 87.1 fL (ref 80.0–100.0)
Monocytes Absolute: 0.7 10*3/uL (ref 0.1–1.0)
Monocytes Relative: 9 %
Neutro Abs: 4.4 10*3/uL (ref 1.7–7.7)
Neutrophils Relative %: 55 %
Platelets: 238 10*3/uL (ref 150–400)
RBC: 4.51 MIL/uL (ref 4.22–5.81)
RDW: 12.3 % (ref 11.5–15.5)
WBC: 7.8 10*3/uL (ref 4.0–10.5)
nRBC: 0 % (ref 0.0–0.2)

## 2023-01-18 LAB — HEMOGLOBIN A1C
Hgb A1c MFr Bld: 11 % — ABNORMAL HIGH (ref 4.8–5.6)
Mean Plasma Glucose: 269 mg/dL

## 2023-01-18 LAB — COMPREHENSIVE METABOLIC PANEL
ALT: 49 U/L — ABNORMAL HIGH (ref 0–44)
AST: 63 U/L — ABNORMAL HIGH (ref 15–41)
Albumin: 2.5 g/dL — ABNORMAL LOW (ref 3.5–5.0)
Alkaline Phosphatase: 140 U/L — ABNORMAL HIGH (ref 38–126)
Anion gap: 8 (ref 5–15)
BUN: 33 mg/dL — ABNORMAL HIGH (ref 6–20)
CO2: 23 mmol/L (ref 22–32)
Calcium: 8.3 mg/dL — ABNORMAL LOW (ref 8.9–10.3)
Chloride: 101 mmol/L (ref 98–111)
Creatinine, Ser: 1.71 mg/dL — ABNORMAL HIGH (ref 0.61–1.24)
GFR, Estimated: 48 mL/min — ABNORMAL LOW (ref >60)
Glucose, Bld: 489 mg/dL — ABNORMAL HIGH (ref 70–99)
Potassium: 4.3 mmol/L (ref 3.5–5.1)
Sodium: 132 mmol/L — ABNORMAL LOW (ref 135–145)
Total Bilirubin: 0.6 mg/dL (ref 0.3–1.2)
Total Protein: 6.1 g/dL — ABNORMAL LOW (ref 6.5–8.1)

## 2023-01-18 LAB — BRAIN NATRIURETIC PEPTIDE: B Natriuretic Peptide: 141.8 pg/mL — ABNORMAL HIGH (ref 0.0–100.0)

## 2023-01-18 LAB — TROPONIN I (HIGH SENSITIVITY)
Troponin I (High Sensitivity): 329 ng/L (ref <18)
Troponin I (High Sensitivity): 376 ng/L (ref <18)

## 2023-01-18 LAB — HEPARIN LEVEL (UNFRACTIONATED): Heparin Unfractionated: <0.1 IU/mL — ABNORMAL LOW (ref 0.30–0.70)

## 2023-01-18 MED ORDER — NITROGLYCERIN IN D5W 200-5 MCG/ML-% IV SOLN
0.0000 ug/min | INTRAVENOUS | Status: DC
  Administered 2023-01-18: 80 ug/min via INTRAVENOUS
  Administered 2023-01-18: 5 ug/min via INTRAVENOUS
  Administered 2023-01-19: 80 ug/min via INTRAVENOUS
  Administered 2023-01-19 – 2023-01-20 (×2): 75 ug/min via INTRAVENOUS
  Filled 2023-01-18 (×5): qty 250

## 2023-01-18 MED ORDER — FUROSEMIDE 10 MG/ML IJ SOLN
40.0000 mg | Freq: Once | INTRAMUSCULAR | Status: AC
  Administered 2023-01-18: 40 mg via INTRAVENOUS
  Filled 2023-01-18: qty 4

## 2023-01-18 MED ORDER — HEPARIN (PORCINE) 25000 UT/250ML-% IV SOLN: 1400.0000 [IU]/h | INTRAVENOUS | Status: DC

## 2023-01-18 MED ORDER — HEPARIN BOLUS VIA INFUSION
4000.0000 [IU] | Freq: Once | INTRAVENOUS | Status: AC
  Administered 2023-01-18: 4000 [IU] via INTRAVENOUS
  Filled 2023-01-18: qty 4000

## 2023-01-18 MED ORDER — HYDRALAZINE HCL 25 MG PO TABS
25.0000 mg | ORAL_TABLET | Freq: Three times a day (TID) | ORAL | Status: DC
  Administered 2023-01-19 – 2023-01-20 (×5): 25 mg via ORAL
  Filled 2023-01-18 (×5): qty 1

## 2023-01-18 MED ORDER — ASPIRIN 81 MG PO CHEW
324.0000 mg | CHEWABLE_TABLET | Freq: Once | ORAL | Status: AC
  Administered 2023-01-18: 324 mg via ORAL
  Filled 2023-01-18: qty 4

## 2023-01-18 MED ORDER — HEPARIN (PORCINE) 25000 UT/250ML-% IV SOLN
2300.0000 [IU]/h | INTRAVENOUS | Status: DC
  Administered 2023-01-18 – 2023-01-19 (×2): 1400 [IU]/h via INTRAVENOUS
  Administered 2023-01-19: 1900 [IU]/h via INTRAVENOUS
  Administered 2023-01-20: 2100 [IU]/h via INTRAVENOUS
  Administered 2023-01-20: 2400 [IU]/h via INTRAVENOUS
  Administered 2023-01-21 (×2): 2600 [IU]/h via INTRAVENOUS
  Administered 2023-01-22: 2500 [IU]/h via INTRAVENOUS
  Administered 2023-01-22 – 2023-01-23 (×2): 2300 [IU]/h via INTRAVENOUS
  Filled 2023-01-18 (×10): qty 250

## 2023-01-18 MED ORDER — MORPHINE SULFATE (PF) 4 MG/ML IV SOLN
4.0000 mg | Freq: Once | INTRAVENOUS | Status: AC
  Administered 2023-01-18: 4 mg via INTRAVENOUS
  Filled 2023-01-18: qty 1

## 2023-01-18 MED ORDER — AMLODIPINE BESYLATE 10 MG PO TABS
10.0000 mg | ORAL_TABLET | Freq: Every day | ORAL | Status: DC
  Administered 2023-01-18: 10 mg via ORAL
  Filled 2023-01-18: qty 2

## 2023-01-18 MED ORDER — ISOSORBIDE MONONITRATE ER 30 MG PO TB24
30.0000 mg | ORAL_TABLET | Freq: Every day | ORAL | Status: DC
  Administered 2023-01-19 – 2023-01-23 (×6): 30 mg via ORAL
  Filled 2023-01-18 (×6): qty 1

## 2023-01-18 MED ORDER — HEPARIN (PORCINE) 25000 UT/250ML-% IV SOLN
1100.0000 [IU]/h | INTRAVENOUS | Status: DC
  Administered 2023-01-18: 1100 [IU]/h via INTRAVENOUS
  Filled 2023-01-18: qty 250

## 2023-01-18 MED ORDER — FUROSEMIDE 10 MG/ML IJ SOLN
40.0000 mg | Freq: Two times a day (BID) | INTRAMUSCULAR | Status: DC
  Administered 2023-01-18 – 2023-01-19 (×3): 40 mg via INTRAVENOUS
  Filled 2023-01-18 (×3): qty 4

## 2023-01-18 MED ORDER — ISOSORBIDE MONONITRATE ER 30 MG PO TB24: 30.0000 mg | ORAL_TABLET | Freq: Every day | ORAL | Status: DC

## 2023-01-18 MED ORDER — METOPROLOL SUCCINATE ER 50 MG PO TB24
50.0000 mg | ORAL_TABLET | Freq: Every day | ORAL | Status: DC
  Administered 2023-01-18 – 2023-01-24 (×7): 50 mg via ORAL
  Filled 2023-01-18 (×3): qty 1
  Filled 2023-01-18: qty 2
  Filled 2023-01-18 (×3): qty 1

## 2023-01-18 MED ORDER — HEPARIN BOLUS VIA INFUSION
2500.0000 [IU] | Freq: Once | INTRAVENOUS | Status: AC
  Administered 2023-01-18: 2500 [IU] via INTRAVENOUS
  Filled 2023-01-18: qty 2500

## 2023-01-18 NOTE — Progress Notes (Signed)
ANTICOAGULATION CONSULT NOTE - Initial Consult  Pharmacy Consult for heparin Indication: chest pain/ACS  No Known Allergies  Patient Measurements: Height:  (172.7 cm) Weight: 104.3 kg (230 lb) IBW/kg (Calculated) : 68.4 Heparin Dosing Weight: 91.1kg  Vital Signs: Temp: 98.5 F (36.9 C) (04/17 0623) BP: 165/105 (04/17 0854) Pulse Rate: 95 (04/17 0854)  Labs: Recent Labs    01/18/23 0641  HGB 13.6  HCT 39.3  PLT 238  CREATININE 1.71*  TROPONINIHS 329*    Estimated Creatinine Clearance: 61.2 mL/min (A) (by C-G formula based on SCr of 1.71 mg/dL (H)).   Medical History: Past Medical History:  Diagnosis Date   Acute gangrenous appendicitis 09/25/2022   Anxiety    CHF (congestive heart failure)    Depression    Diabetes mellitus without complication    Hypertension     Medications:  Infusions:   heparin     nitroGLYCERIN 30 mcg/min (01/18/23 0853)    Assessment: 51 yom presented to the ED with CP. Troponin elevated and now starting IV heparin. Baseline CBC is WNL and he is not on anticoagulation PTA.   Goal of Therapy:  Heparin level 0.3-0.7 units/ml Monitor platelets by anticoagulation protocol: Yes   Plan:  Heparin bolus 4000 units IV x 1 Heparin gtt 1100 units/hr Check a 6 hr heparin level Daily heparin level and CBC  Leroy Simmons, Leroy Simmons 01/18/2023,8:57 AM

## 2023-01-18 NOTE — ED Notes (Addendum)
ED TO INPATIENT HANDOFF REPORT  ED Nurse Name and Phone #: Donny Pique, RN [phone]  S Name/Age/Gender Leroy Simmons 52 y.o. male Room/Bed: 015C/015C  Code Status   Code Status: Full Code  Home/SNF/Other Home Patient oriented to: self, place, situation, time Is this baseline? Yes   Triage Complete: Triage complete  Chief Complaint NSTEMI (non-ST elevated myocardial infarction) [I21.4]  Triage Note SOB x 1 week with productive cough. Worse with exertion. States "lungs feel full of fluid" hard to get good breath. Chest pain starting this morning in rib area.    Allergies No Known Allergies  Level of Care/Admitting Diagnosis ED Disposition     ED Disposition  Admit   Condition  --   Comment  Hospital Area: [facility] [[id number]]  Level of Care: Telemetry Cardiac [[id number]]  May admit patient to [facility] or [facility] if equivalent level of care is available:: No  Covid Evaluation: Asymptomatic - no recent exposure (last 10 days) testing not required  Diagnosis: NSTEMI (non-ST elevated myocardial infarction) [540981]  Admitting Physician: Nolberto Hanlon [[id number]]  Attending Physician: Nolberto Hanlon [[id number]]  Certification:: I certify this patient will need inpatient services for at least 2 midnights  Estimated Length of Stay: 3          B Medical/Surgery History Past Medical History:  Diagnosis Date   Acute gangrenous appendicitis 09/25/2022   Anxiety    CHF (congestive heart failure)    Depression    Diabetes mellitus without complication    Hypertension    Past Surgical History:  Procedure Laterality Date   LAPAROSCOPIC APPENDECTOMY N/A 09/26/2022   Procedure: APPENDECTOMY LAPAROSCOPIC;  Surgeon: Campbell Lerner, MD;  Location: [facility] ORS;  Service: General;  Laterality: N/A;   LEFT HEART CATH AND CORONARY ANGIOGRAPHY N/A 03/21/2022   Procedure: LEFT HEART CATH AND CORONARY ANGIOGRAPHY;  Surgeon: Corky Crafts, MD;  Location: [facility]VAS[facility];   Service: Cardiovascular;  Laterality: N/A;     A IV Location/Drains/Wounds Patient Lines/Drains/Airways Status     Active Line/Drains/Airways     Name Placement date Placement time Site Days   Peripheral IV 01/18/23 20 G Left;Posterior Wrist 01/18/23  0814  Wrist  less than 1   Peripheral IV 01/18/23 18 G Right Antecubital 01/18/23  0829  Antecubital  less than 1   Closed System Drain 1 Inferior;Medial;Midline Abdomen Bulb (JP) 19 Fr. 09/26/22  1411  Abdomen  114   Incision - 3 Ports Abdomen Umbilicus Left;Mid Mid;Lower 09/26/22  1306  -- 114            Intake/Output Last 24 hours  Intake/Output Summary (Last 24 hours) at 01/18/2023 2134 Last data filed at 01/18/2023 1840 Gross per 24 hour  Intake 131.83 ml  Output 1950 ml  Net -1818.17 ml    Labs/Imaging Results for orders placed or performed during the hospital encounter of 01/18/23 (from the past 48 hour(s))  Troponin I (High Sensitivity)     Status: Abnormal   Collection Time: 01/18/23  6:41 AM  Result Value Ref Range   Troponin I (High Sensitivity) 329 (HH) <18 ng/L    Comment: CRITICAL RESULT CALLED TO, READ BACK BY AND VERIFIED WITH Merlinda Frederick, RN [phone] 01/18/23 L. KLAR (NOTE) Elevated high sensitivity troponin I (hsTnI) values and significant  changes across serial measurements may suggest ACS but many other  chronic and acute conditions are known to elevate hsTnI results.  Refer to the "Links" section for chest pain algorithms and  additional  guidance. Performed at [facility], [address] [place], [place] [address]   Comprehensive metabolic panel     Status: Abnormal   Collection Time: 01/18/23  6:41 AM  Result Value Ref Range   Sodium 132 (L) 135 - 145 mmol/L   Potassium 4.3 3.5 - 5.1 mmol/L   Chloride 101 98 - 111 mmol/L   CO2 23 22 - 32 mmol/L   Glucose, Bld 489 (H) 70 - 99 mg/dL    Comment: Glucose reference range applies only to samples taken after fasting for at least 8 hours.   BUN 33  (H) 6 - 20 mg/dL   Creatinine, Ser 6.04 (H) 0.61 - 1.24 mg/dL   Calcium 8.3 (L) 8.9 - 10.3 mg/dL   Total Protein 6.1 (L) 6.5 - 8.1 g/dL   Albumin 2.5 (L) 3.5 - 5.0 g/dL   AST 63 (H) 15 - 41 U/L   ALT 49 (H) 0 - 44 U/L   Alkaline Phosphatase 140 (H) 38 - 126 U/L   Total Bilirubin 0.6 0.3 - 1.2 mg/dL   GFR, Estimated 48 (L) >60 mL/min    Comment: (NOTE) Calculated using the CKD-EPI Creatinine Equation (2021)    Anion gap 8 5 - 15    Comment: Performed at [facility], [address], [place], [place] [address]  CBC with Differential     Status: None   Collection Time: 01/18/23  6:41 AM  Result Value Ref Range   WBC 7.8 4.0 - 10.5 K/uL   RBC 4.51 4.22 - 5.81 MIL/uL   Hemoglobin 13.6 13.0 - 17.0 g/dL   HCT 11.9 14.7 - 82.9 %   MCV 87.1 80.0 - 100.0 fL   MCH 30.2 26.0 - 34.0 pg   MCHC 34.6 30.0 - 36.0 g/dL   RDW 56.2 13.0 - 86.5 %   Platelets 238 150 - 400 K/uL   nRBC 0.0 0.0 - 0.2 %   Neutrophils Relative % 55 %   Neutro Abs 4.4 1.7 - 7.7 K/uL   Lymphocytes Relative 30 %   Lymphs Abs 2.4 0.7 - 4.0 K/uL   Monocytes Relative 9 %   Monocytes Absolute 0.7 0.1 - 1.0 K/uL   Eosinophils Relative 4 %   Eosinophils Absolute 0.3 0.0 - 0.5 K/uL   Basophils Relative 1 %   Basophils Absolute 0.1 0.0 - 0.1 K/uL   Immature Granulocytes 1 %   Abs Immature Granulocytes 0.04 0.00 - 0.07 K/uL    Comment: Performed at [facility], [address], [place], [place] [address]  Brain natriuretic peptide     Status: Abnormal   Collection Time: 01/18/23  6:41 AM  Result Value Ref Range   B Natriuretic Peptide 141.8 (H) 0.0 - 100.0 pg/mL    Comment: Performed at [facility], [address], [place], [place] [address]  Troponin I (High Sensitivity)     Status: Abnormal   Collection Time: 01/18/23  8:51 AM  Result Value Ref Range   Troponin I (High Sensitivity) 376 (HH) <18 ng/L    Comment: CRITICAL VALUE NOTED. VALUE IS CONSISTENT WITH PREVIOUSLY REPORTED/CALLED  VALUE (NOTE) Elevated high sensitivity troponin I (hsTnI) values and significant  changes across serial measurements may suggest ACS but many other  chronic and acute conditions are known to elevate hsTnI results.  Refer to the "Links" section for chest pain algorithms and additional  guidance. Performed at [facility], [address] [place], [place] [address]   Hemoglobin A1c     Status: Abnormal   Collection Time: 01/18/23  5:55 PM  Result Value Ref Range   Hgb A1c MFr Bld 11.0 (H) 4.8 - 5.6 %    Comment: (NOTE) Pre diabetes:          5.7%-6.4%  Diabetes:              >6.4%  Glycemic control for   <7.0% adults with diabetes    Mean Plasma Glucose 269 mg/dL    Comment: Performed at [facility], [address] [place], [place] [address]  Heparin level (unfractionated)     Status: Abnormal   Collection Time: 01/18/23  5:55 PM  Result Value Ref Range   Heparin Unfractionated <0.10 (L) 0.30 - 0.70 IU/mL    Comment: (NOTE) The clinical reportable range upper limit is being lowered to >1.10 to align with the FDA approved guidance for the current laboratory assay.  If heparin results are below expected values, and patient dosage has  been confirmed, suggest follow up testing of antithrombin III levels. Performed at [facility], [address], [place], [place] [address]    US Abdomen Limited RUQ (LIVER/GB)  Result Date: 01/18/2023 CLINICAL DATA:  142201 with hepatitis. EXAM: ULTRASOUND ABDOMEN LIMITED RIGHT UPPER QUADRANT COMPARISON:  CT with IV contrast 09/25/2022 FINDINGS: Gallbladder: No gallstones or wall thickening visualized. No sonographic Murphy sign noted by sonographer. Common bile duct: Diameter: 4.0 mm with no intrahepatic biliary prominence. Liver: No focal lesion identified. There is mild generalized increased hepatic echogenicity consistent with the steatosis noted on the prior CT. Portal vein is patent on color Doppler imaging with normal  direction of blood flow towards the liver. The portal vein again measures slightly prominent at 15 mm. This was seen previously. Other: None. IMPRESSION: 1. No gallstones, wall thickening or biliary dilatation. 2. Mild hepatic steatosis. Electronically Signed   By: Almira Bar M.D.   On: 01/18/2023 20:53   DG Chest 2 View  Result Date: 01/18/2023 CLINICAL DATA:  52 year old male with history of chest pain and dizziness. EXAM: CHEST - 2 VIEW COMPARISON:  Chest x-ray 12/22/2022. FINDINGS: There is cephalization of the pulmonary vasculature and slight indistinctness of the interstitial markings suggestive of mild pulmonary edema. No definite pleural effusions. No pneumothorax. Heart size is mildly enlarged. Upper mediastinal contours are within normal limits. IMPRESSION: 1. The appearance of the chest is most concerning for mild congestive heart failure, as above. Electronically Signed   By: Trudie Reed M.D.   On: 01/18/2023 06:50    Pending Labs Unresulted Labs (From admission, onward)     Start     Ordered   01/19/23 0500  Basic metabolic panel  Tomorrow morning,   R        01/18/23 1624   01/19/23 0500  Magnesium  Tomorrow morning,   R        01/18/23 1624   01/19/23 0500  Heparin level (unfractionated)  Daily,   R     See Hyperspace for full Linked Orders Report.   01/18/23 1956   01/19/23 0500  CBC  Daily,   R     See Hyperspace for full Linked Orders Report.   01/18/23 1956   01/19/23 0230  Heparin level (unfractionated)  Once-Timed,   TIMED        01/18/23 1959   01/18/23 1958  Protein / creatinine ratio, urine  Once,   R  01/18/23 1957   Signed and Held  Comprehensive metabolic panel  Tomorrow morning,   R        Signed and Held   Signed and Held  CBC  Tomorrow morning,   R        Signed and Held   Signed and Held  Protime-INR  Tomorrow morning,   R        Signed and Held   Signed and Held  APTT  Tomorrow morning,   R        Signed and Held   Signed and Held  Lipid  panel  Tomorrow morning,   R        Signed and Held            Vitals/Pain Today's Vitals   01/18/23 1900 01/18/23 1915 01/18/23 2012 01/18/23 2015  BP: 129/69 (!) 155/94  (!) 141/89  Pulse: 88 93  91  Resp: Temp:   98.4 F (36.9 C)   TempSrc:   Oral   SpO2: 96% 99%  93%  Weight:      Height:      PainSc:        Isolation Precautions No active isolations  Medications Medications  nitroGLYCERIN 50 mg in dextrose 5 % 250 mL (0.2 mg/mL) infusion (80 mcg/min Intravenous New Bag/Given 01/18/23 1953)  amLODipine (NORVASC) tablet 10 mg (10 mg Oral Given 01/18/23 1835)  hydrALAZINE (APRESOLINE) tablet 25 mg (has no administration in time range)  furosemide (LASIX) injection 40 mg (40 mg Intravenous Given 01/18/23 1835)  metoprolol succinate (TOPROL-XL) 24 hr tablet 50 mg (50 mg Oral Given 01/18/23 1835)  isosorbide mononitrate (IMDUR) 24 hr tablet 30 mg (has no administration in time range)  heparin ADULT infusion 100 units/mL (25000 units/272mL) (1,400 Units/hr Intravenous New Bag/Given 01/18/23 2010)  furosemide (LASIX) injection 40 mg (40 mg Intravenous Given 01/18/23 0814)  aspirin chewable tablet 324 mg (324 mg Oral Given 01/18/23 0833)  heparin bolus via infusion 4,000 Units (4,000 Units Intravenous Bolus from Bag 01/18/23 0911)  morphine (PF) 4 MG/ML injection 4 mg (4 mg Intravenous Given 01/18/23 1107)  heparin bolus via infusion 2,500 Units (2,500 Units Intravenous Bolus from Bag 01/18/23 2010)    Mobility walks     Focused Assessments    R Recommendations: See Admitting Provider Note  Report given to:   Additional Notes: Patient is alert and oriented, watching TV and comfortable. Has been using a urinal. He has a stuffed animal with him for comfort and has been cooperative and pleasant. Heparin and Nitro both running, patient has two IVs

## 2023-01-18 NOTE — Consult Note (Addendum)
Cardiology Consultation   Patient ID: Leroy Simmons MRN: [id number]; DOB: June 14, 1971  Admit date: 01/18/2023 Date of Consult: 01/18/2023  PCP:  de [place], Raymond J, MD   [facility] Providers Cardiologist:  Dr Azucena Cecil     Patient Profile:   Leroy Simmons is a 52 y.o. male with a hx of hypertension, hyperlipidemia, chronic diastolic heart failure, nonobstructive CAD, anxiety with depression, type 2 diabetes, pulmonary nodules, asthma, who is being seen 01/18/2023 for the evaluation of CHF at the request of Dr Adela Lank.  History of Present Illness:   Leroy Simmons with above PMH presented to ER today c/o SOB and chest discomfort. He thought he was hospitalized for COPD in 03/2022, he was actually hospitalized for CHF at  the time. He states over the past week, he has been having increased SOB with activity or at rest. He endorses orthopnea and PND. He note increased bilateral leg edema. He states he is taking lasix 1 tablet daily, felt urination is less than usual. He also has been getting mid-sternum chest tightness. It usually accompanies SOB, can occur with activity or at rest, can last from an hour to few hours. He states he had been feeling "off" in his head. 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 He denied tobacco use, illicit use, or ETOH use. He takes lisinopril for HTN daily, does not check BP at home.   Per chart review, he has not seen [facility] outpatient, was initially hospitalized in 03/2022 for acute diastolic heart failure. Echocardiogram 03/17/2022 showed LVEF 55 to 60%, no RWMA, indeterminate diastolic parameters, normal RV, trivial MR and mild AI. He had mild elevated troponin at 30s that was felt not consistent with ACS.  Left heart catheterization was recommended following abnormal coronary CTA,  this was done on 03/21/22 and showed non-obstructive CAD with 50% D2, 40% mid LAD, 25% ramus, LVEDP  elevated .  He had required IV diuresis with Lasix with 9.9L out. He was sent home with lasix  BID. He was on metoprolol XL , crestor , jardiance  for GDMT. Lisinopril was held due to AKI on CKD.   He was hospitalized in 06/2022 for AKI due to dehydration. Renal index was improved with IVF. Lisinopril was stopped. Lasix was reduced to  daily.   He was hospitalized again in 09/2022 for acute gangrenous appendicitis  with peritoneal abscess, underwent laparoscopic appendectomy as well as abscess drain placement. Course was complicated by AKI on CKD II, that was felt due to contrast induced nephropathy, he had worsening BLE edema after receiving IVF. Echo was done 09/27/22 which showed LVEF 55-60%, no RWMA, mild LVH, grade II DD, normal RV, mild LAE,trivial MR, aortic sclerosis.  He was seen by Dr Azucena Cecil 09/26/22, recommended diuresis with IV Lasix. Lasix was held later per nephrology but was resumed at the time of discharge with  daily.    Past Medical History:  Diagnosis Date   Acute gangrenous appendicitis 09/25/2022   Anxiety    CHF (congestive heart failure)    Depression    Diabetes mellitus without complication    Hypertension     Past Surgical History:  Procedure Laterality Date   LAPAROSCOPIC APPENDECTOMY N/A 09/26/2022   Procedure: APPENDECTOMY LAPAROSCOPIC;  Surgeon: Campbell Lerner, MD;  Location: [facility] ORS;  Service: General;  Laterality: N/A;   LEFT HEART CATH AND CORONARY ANGIOGRAPHY N/A 03/21/2022   Procedure:  LEFT HEART CATH AND CORONARY ANGIOGRAPHY;  Surgeon: Corky Crafts, MD;  Location: [facility] INVASIVE CV LAB;  Service: Cardiovascular;  Laterality: N/A;     Home Medications:  Prior to Admission medications   Medication Sig Start Date End Date Taking? Authorizing Provider  albuterol (VENTOLIN HFA) 108 (90 Base) MCG/ACT inhaler Inhale 2 puffs into the lungs every 6 (six) hours as needed for wheezing or shortness of breath. 01/13/23  Yes  Leslye Peer, MD  amLODipine (NORVASC) 5 MG tablet Take 1 tablet (5 mg total) by mouth daily. 05/24/22  Yes de [place], Raymond J, MD  blood glucose meter kit and supplies Dispense based on patient and insurance preference. Use up to four times daily as directed. (FOR ICD-10 E10.9, E11.9). 05/24/22  Yes de [place], Raymond J, MD  busPIRone (BUSPAR) 5 MG tablet TAKE 1 & 1/2 (ONE & ONE-HALF) TABLETS BY MOUTH TWICE DAILY Patient taking differently: Take 5 mg by mouth 2 (two) times daily. 06/14/22  Yes de [place], Raymond J, MD  furosemide (LASIX) 40 MG tablet Take 1 tablet (40 mg total) by mouth daily. 06/06/22  Yes Narda Bonds, MD  insulin glargine (LANTUS) 100 unit/mL SOPN Inject 26 Units into the skin daily. Patient taking differently: Inject 26 Units into the skin in the morning. 05/24/22  Yes de [place], Buren Kos, MD  Insulin Pen Needle (PEN NEEDLES 3/16") 31G X 5 MM MISC 1 each by Does not apply route daily. 01/18/22  Yes de [place], Raymond J, MD  JARDIANCE 10 MG TABS tablet Take 1 tablet (10 mg total) by mouth every morning. 05/24/22  Yes de [place], Buren Kos, MD  lisinopril (ZESTRIL) 20 MG tablet Take 20 mg by mouth daily.   Yes [provider]  metoprolol succinate (TOPROL-XL) 50 MG 24 hr tablet Take 1 tablet (50 mg total) by mouth daily. Take with or immediately following a meal. Patient taking differently: Take 50 mg by mouth daily. 05/24/22  Yes de [place], Raymond J, MD  mupirocin ointment (BACTROBAN) 2 % Apply 1 Application topically 2 (two) times daily. 12/22/22  Yes Fisher, Roselyn Bering, PA-C  rosuvastatin (CRESTOR) 20 MG tablet Take 1 tablet (20 mg total) by mouth daily. 05/24/22 01/18/23 Yes de [place], Raymond J, MD  HYDROcodone-acetaminophen (NORCO/VICODIN) 5-325 MG tablet Take 1-2 tablets by mouth every 4 (four) hours as needed for moderate pain. Patient not taking: Reported on 01/18/2023 09/27/22   Leafy Ro, MD  hydrOXYzine (ATARAX) 10 MG tablet Take 1 tablet (10 mg total) by mouth 3 (three) times  daily as needed. Patient not taking: Reported on 01/18/2023 12/22/22   Faythe Ghee, PA-C  isosorbide mononitrate (IMDUR) 30 MG 24 hr tablet Take 0.5 tablets (15 mg total) by mouth daily. Patient not taking: Reported on 01/18/2023 03/29/22   Joylene Grapes, NP  metFORMIN (GLUCOPHAGE-XR) 500 MG 24 hr tablet Take 4 tablets (2,000 mg total) by mouth at bedtime. Patient not taking: Reported on 01/18/2023 05/24/22   de [place], Buren Kos, MD  sertraline (ZOLOFT) 25 MG tablet Take 1 tablet (25 mg total) by mouth daily. Patient not taking: Reported on 01/18/2023 01/18/22   de [place], Buren Kos, MD    Inpatient Medications: Scheduled Meds:  Continuous Infusions:  heparin 1,100 Units/hr (01/18/23 1141)   nitroGLYCERIN 80 mcg/min (01/18/23 1141)   PRN Meds:   Allergies:   No Known Allergies  Social History:   Social History   Socioeconomic History   Marital status: Single  Spouse name: Not on file   Number of children: Not on file   Years of education: Not on file   Highest education level: Not on file  Occupational History   Occupation: retail  Tobacco Use   Smoking status: Never   Smokeless tobacco: Never  Vaping Use   Vaping Use: Never used  Substance and Sexual Activity   Alcohol use: Yes    Comment: rarely   Drug use: Never   Sexual activity: Not on file  Other Topics Concern   Not on file  Social History Narrative   Not on file   Social Determinants of Health   Financial Resource Strain: Not on file  Food Insecurity: Not on file  Transportation Needs: Not on file  Physical Activity: Not on file  Stress: Not on file  Social Connections: Not on file  Intimate Partner Violence: Not on file    Family History:    Family History  Problem Relation Age of Onset   Heart failure Mother      ROS:  Constitutional: Denied fever, chills, malaise, night sweats Eyes: Denied vision change or loss Ears/Nose/Mouth/Throat: Denied ear ache, sore throat, coughing, sinus  pain Cardiovascular:see HPI  Respiratory: see HPI  Gastrointestinal: Denied nausea, vomiting, abdominal pain, diarrhea Genital/Urinary: decreased urination  Musculoskeletal: Denied muscle ache, joint pain, weakness Skin: Denied rash, wound Neuro: feeling "off"  Psych: history of depression/anxiety  Endocrine: history of diabetes     Physical Exam/Data:   Vitals:   01/18/23 1330 01/18/23 1345 01/18/23 1400 01/18/23 1415  BP: (!) 142/90 (!) 147/100 (!) 144/101 (!) 130/92  Pulse: 89 88 88 87  Resp: 16 14 17 14   Temp:      SpO2: 97% 98% 97% 98%  Weight:      Height:        Intake/Output Summary (Last 24 hours) at 01/18/2023 1541 Last data filed at 01/18/2023 1141 Gross per 24 hour  Intake 131.83 ml  Output 1250 ml  Net -1118.17 ml      01/18/2023    8:49 AM 01/13/2023    2:48 PM 12/22/2022    3:40 PM  Last 3 Weights  Weight (lbs) 230 lb 235 lb 3.2 oz 225 lb  Weight (kg) 104.327 kg 106.686 kg 102.059 kg     Body mass index is 34.97 kg/m.   Vitals:  Vitals:   01/18/23 1400 01/18/23 1415  BP: (!) 144/101 (!) 130/92  Pulse: 88 87  Resp: 17 14  Temp:    SpO2: 97% 98%   General Appearance: In no apparent distress,sitting in bed  HEENT: Normocephalic, atraumatic.  Neck: Supple, trachea midline, JVD up to mid neck with HOB at 30 degree  Cardiovascular: Regular rate and rhythm, normal S1-S2,  no murmur Respiratory: Resting breathing unlabored, lungs sounds clear but diminished at base to auscultation bilaterally, no use of accessory muscles. On [place] oxygen.  Gastrointestinal: Bowel sounds positive, abdomen soft, non-tender Extremities: Able to move all extremities in bed without difficulty, 2+ pitting edema of BLE  Musculoskeletal: Normal muscle bulk and tone Skin: Intact, warm, dry. No rashes or petechiae noted in exposed areas.  Neurologic: Alert, oriented to person, place and time. Fluent speech, no facial droop, no cognitive deficit, memory loss noted Psychiatric:  Normal affect. Mood is appropriate.     EKG:  The EKG was personally reviewed and demonstrates:    EKG from 01/17/22 at 0623 showed sinus rhythm 96 bpm, TWI of lateral leads seems to be present in  12/22/22, repolarization abnormalities    EKG from 01/18/23 at 0812 showed sinus rhythm 93 bpm, TWI of lateral leads unchanged   EKG from 01/18/23 at 1105 showed showed sinus rhythm 89bpm, TWI of lateral leads unchanged   Telemetry:  Telemetry was personally reviewed and demonstrates:    Sinus rhythm    Relevant CV Studies:  Echo from 09/27/22:  1. Left ventricular ejection fraction, by estimation, is 55 to 60%. The  left ventricle has normal function. The left ventricle has no regional  wall motion abnormalities. There is mild left ventricular hypertrophy.  Left ventricular diastolic parameters  are consistent with Grade II diastolic dysfunction (pseudonormalization).   2. Right ventricular systolic function is normal. The right ventricular  size is normal. Tricuspid regurgitation signal is inadequate for assessing  PA pressure.   3. Left atrial size was mildly dilated.   4. The mitral valve is normal in structure. Trivial mitral valve  regurgitation. No evidence of mitral stenosis.   5. The aortic valve is normal in structure. Aortic valve regurgitation is  not visualized. Aortic valve sclerosis/calcification is present, without  any evidence of aortic stenosis.   6. The inferior vena cava is dilated in size with >50% respiratory  variability, suggesting right atrial pressure of 8 mmHg.    Laboratory Data:  High Sensitivity Troponin:   Recent Labs  Lab 01/18/23 0641 01/18/23 0851  TROPONINIHS 329* 376*     Chemistry Recent Labs  Lab 01/18/23 0641  NA 132*  K 4.3  CL 101  CO2 23  GLUCOSE 489*  BUN 33*  CREATININE 1.71*  CALCIUM 8.3*  GFRNONAA 48*  ANIONGAP 8    Recent Labs  Lab 01/18/23 0641  PROT 6.1*  ALBUMIN 2.5*  AST 63*  ALT 49*  ALKPHOS 140*  BILITOT  0.6   Lipids No results for input(s): "CHOL", "TRIG", "HDL", "LABVLDL", "LDLCALC", "CHOLHDL" in the last 168 hours.  Hematology Recent Labs  Lab 01/18/23 0641  WBC 7.8  RBC 4.51  HGB 13.6  HCT 39.3  MCV 87.1  MCH 30.2  MCHC 34.6  RDW 12.3  PLT 238   Thyroid No results for input(s): "TSH", "FREET4" in the last 168 hours.  BNP Recent Labs  Lab 01/18/23 0641  BNP 141.8*    DDimer No results for input(s): "DDIMER" in the last 168 hours.   Radiology/Studies:  DG Chest 2 View  Result Date: 01/18/2023 CLINICAL DATA:  52 year old male with history of chest pain and dizziness. EXAM: CHEST - 2 VIEW COMPARISON:  Chest x-ray 12/22/2022. FINDINGS: There is cephalization of the pulmonary vasculature and slight indistinctness of the interstitial markings suggestive of mild pulmonary edema. No definite pleural effusions. No pneumothorax. Heart size is mildly enlarged. Upper mediastinal contours are within normal limits. IMPRESSION: 1. The appearance of the chest is most concerning for mild congestive heart failure, as above. Electronically Signed   By: Trudie Reed M.D.   On: 01/18/2023 06:50     Assessment and Plan:   Acute on chronic diastolic heart failure  - presented with 1 week progressively SOB with exertion and at rest, orthopnea, PND, chest tightness, and leg edema  - BNP 141, POA - Hs trop 329 >376, POA - CXR showed mild CHF, POA - Echo from 12/23 showed LVEF 55-60%, grade II DD - suspect due to uncontrolled HTN  - clinically hypervolemic, self reports he is compliant with diuretic and does not consume high sodium diet, on PTA Lasix  daily, Cr 1.71 POA,  no recent labs to compare for baseline, Cr was 1.33 in Jan 2024, UOP so far after receiving IV Lasix 40mg  x1 at ED, will start IV Lasix 40mg  BID, track daily weight and I&O - GDMT: OK to continue metoprolol XL 50mg  as he does not appear in low output CHF; hold lisinopril while diuresis and trend renal index,  consider SGLT2i/will check A1C first   HTN urgency  - BP elevated up to 201/145 at admission - now improved on nitro gtt , plan to wean off  - on PTA amlodipine 5mg , lisinopril 20mg , Imdur 30mg , and lasix 40mg  daily - he states he is compliant with medications  - will increase amlodipine from 5 to 10mg , hold lisinopril, resume imdur 30mg , increase lasix to 40mg  BID, and add hydralazine 25mg  TID for now   Elevated troponin  Non-obstructive CAD - LHC from 03/2022 showed non-obstructive CAD with 50% D2, 40% mid LAD, 25% ramus - potential risk factor for worsening disease: poorly controlled HTN, DM  - Hs trop 329 >376, not consistent with ACS, suspect chest pain is from CHF decompensation  - would diuresis to euvolemia, if chest discomfort remains, may consider repeat ischemic workup  - continue crestor 20mg  daily, metoprolol XL 50mg  daily,  not on ASA PTA, ok to stop heparin gtt   AKI versus CKD III - Cr 1.71, POA, was 1.33 in 10/2022, no recent labs - unclear if this is AKI or CKD, does have hx of frequent AKI - trend BMP daily and  watch UOP closely   Type 2 DM Anxiety with depression Asthma  Pulmonary nodule - per primary team      Risk Assessment/Risk Scores:    New York Heart Association (NYHA) Functional Class NYHA Class III        For questions or updates, please contact [facility] Please consult [email or web address] for contact info under    Signed, Cyndi Bender, NP  01/18/2023 3:41 PM

## 2023-01-18 NOTE — Assessment & Plan Note (Addendum)
Uncontrolled hyperglycemia.   Continue insulin therapy with sliding scale and basal insulin.  His fasting glucose this am was 213 Add pre meal insulin 5 units.   Continue with statin therapy.

## 2023-01-18 NOTE — Assessment & Plan Note (Deleted)
Working diagnosis is fluid overload from congestive heart failure that is previously known to be preserved ejection fraction.  Repeat echo is pending for the morning.  Other considerations include proteinuria therefore we will check for same it is noted that the patient's liver enzymes are elevated, we will check acute hepatitis panel and liver sonograph

## 2023-01-18 NOTE — Assessment & Plan Note (Deleted)
Treat with intravenous heparin, aspirin, statin, beta-blocker.  I have held patient's amlodipine to give adequate room on blood pressure to titrate up other medications -although cardiology raises no objection to same..  Continue with nitrates as needed to relieve chest discomfort.  As per cardio note - LHC from 03/2022 showed non-obstructive CAD with 50% D2, 40% mid LAD, 25% ramus .  No plan for coronary angiogram at this time.  Plan she is under considerations would include possible venous thromboembolism given chest pain associated with lower extremity swelling as well as troponin elevation, will check lower extremity Doppler for this purpose.  Factors include known hypertension as well as uncontrolled diabetes

## 2023-01-18 NOTE — ED Notes (Signed)
BP 181/112 HR 92

## 2023-01-18 NOTE — Progress Notes (Signed)
ANTICOAGULATION CONSULT NOTE - Initial Consult  Pharmacy Consult for heparin Indication: chest pain/ACS  No Known Allergies  Patient Measurements: Height:  (172.7 cm) Weight: 104.3 kg (230 lb) IBW/kg (Calculated) : 68.4 Heparin Dosing Weight: 91.1kg  Vital Signs: Temp: 98.2 F (36.8 C) (04/17 1515) BP: 132/93 (04/17 1515) Pulse Rate: 81 (04/17 1515)  Labs: Recent Labs    01/18/23 0641 01/18/23 0851  HGB 13.6  --   HCT 39.3  --   PLT 238  --   CREATININE 1.71*  --   TROPONINIHS 329* 376*     Estimated Creatinine Clearance: 61.2 mL/min (A) (by C-G formula based on SCr of 1.71 mg/dL (H)).   Medical History: Past Medical History:  Diagnosis Date   Acute gangrenous appendicitis 09/25/2022   Anxiety    CHF (congestive heart failure)    Depression    Diabetes mellitus without complication    Hypertension     Medications:  Infusions:   nitroGLYCERIN 80 mcg/min (01/18/23 1141)    Assessment: 51 yom presented to the ED with CP. Troponin elevated and now starting IV heparin. Baseline CBC is WNL and he is not on anticoagulation PTA.   Patient's symptoms have improved with diuresis. The plan is to continue heparin x48hrs per cardiology. STAT  heparin level is undetectable at <0.10. Hgb 13.6, plts 329--stable. No line issues or s/sx of bleeding reported.   Goal of Therapy:  Heparin level 0.3-0.7 units/ml Monitor platelets by anticoagulation protocol: Yes   Plan:  Re-bolus heparin IV 2500 units x1   Increase heparin gtt to 1400 units/hr Check a 6 hr heparin level Monitor heparin level, CBC, s/sx of bleeding daily    Cherylin Mylar, PharmD PGY1 Pharmacy Resident 4/17/20245:41 PM

## 2023-01-18 NOTE — ED Triage Notes (Signed)
SOB x 1 week with productive cough. Worse with exertion. States "lungs feel full of fluid" hard to get good breath. Chest pain starting this morning in rib area.

## 2023-01-18 NOTE — H&P (Signed)
History and Physical    Patient: Leroy Simmons OZH:[id number] DOB: August 21, 1971 DOA: 01/18/2023 DOS: the patient was seen and examined on 01/18/2023 PCP: de [place], Buren Kos, MD  Patient coming from: Home  Chief Complaint:  Chief Complaint  Patient presents with   Chest Pain   HPI: Leroy Simmons is a 52 y.o. male with medical history significant of unspecified heart failure as well as questionable COPD.  Patient generally reports having chronic leg swelling, however is ambulatory without much shortness of breath.  Patient reports that for the last 1 week he has had a squeezing sensation on and off in his anterior chest without radiation.  Patient does not report any aggravating or relieving factors.  Also since the last 1 week patient has not developed progressive lower extremity swelling that has progressed up to his mid thigh area associated with paroxysmal nocturnal dyspnea, trouble laying flat to sleep as well as dry cough.  There is no fever reported.  No presyncope no palpitations.  Patient has nausea however no vomiting.  Patient reports having had diarrhea about 5 days ago that has self resolved.  There is no bleeding anywhere.    Last night patient reports having such severe chest pain that he could not sleep all night due to squeezing chest pain again without any aggravating relieving factor or radiation.  In the anterior substernal area.  Patient came to the ER for further evaluation of the squeezing chest pain.    ER course is notable for patient being found to be in NSTEMI s/p heparin infusion aspirin as well as beta-blockers.  Started on Lasix  Review of Systems: As mentioned in the history of present illness. All other systems reviewed and are negative. Past Medical History:  Diagnosis Date   Acute gangrenous appendicitis 09/25/2022   Anxiety    CHF (congestive heart failure)    Depression    Diabetes mellitus without complication    Hypertension    Past Surgical History:   Procedure Laterality Date   LAPAROSCOPIC APPENDECTOMY N/A 09/26/2022   Procedure: APPENDECTOMY LAPAROSCOPIC;  Surgeon: Campbell Lerner, MD;  Location: [facility] ORS;  Service: General;  Laterality: N/A;   LEFT HEART CATH AND CORONARY ANGIOGRAPHY N/A 03/21/2022   Procedure: LEFT HEART CATH AND CORONARY ANGIOGRAPHY;  Surgeon: Corky Crafts, MD;  Location: [facility] INVASIVE CV LAB;  Service: Cardiovascular;  Laterality: N/A;   Social History:  reports that he has never smoked. He has never used smokeless tobacco. He reports current alcohol use. He reports that he does not use drugs.  No Known Allergies  Family History  Problem Relation Age of Onset   Heart failure Mother     Prior to Admission medications   Medication Sig Start Date End Date Taking? Authorizing Provider  albuterol (VENTOLIN HFA) 108 (90 Base) MCG/ACT inhaler Inhale 2 puffs into the lungs every 6 (six) hours as needed for wheezing or shortness of breath. 01/13/23  Yes Leslye Peer, MD  amLODipine (NORVASC) 5 MG tablet Take 1 tablet (5 mg total) by mouth daily. 05/24/22  Yes de [place], Raymond J, MD  blood glucose meter kit and supplies Dispense based on patient and insurance preference. Use up to four times daily as directed. (FOR ICD-10 E10.9, E11.9). 05/24/22  Yes de [place], Raymond J, MD  busPIRone (BUSPAR) 5 MG tablet TAKE 1 & 1/2 (ONE & ONE-HALF) TABLETS BY MOUTH TWICE DAILY Patient taking differently: Take 5 mg by mouth 2 (two) times daily. 06/14/22  Yes  de [place], Buren Kos, MD  furosemide (LASIX) 40 MG tablet Take 1 tablet (40 mg total) by mouth daily. 06/06/22  Yes Narda Bonds, MD  insulin glargine (LANTUS) 100 unit/mL SOPN Inject 26 Units into the skin daily. Patient taking differently: Inject 26 Units into the skin in the morning. 05/24/22  Yes de [place], Buren Kos, MD  Insulin Pen Needle (PEN NEEDLES 3/16") 31G X 5 MM MISC 1 each by Does not apply route daily. 01/18/22  Yes de [place], Raymond J, MD  JARDIANCE 10 MG TABS tablet  Take 1 tablet (10 mg total) by mouth every morning. 05/24/22  Yes de [place], Buren Kos, MD  lisinopril (ZESTRIL) 20 MG tablet Take 20 mg by mouth daily.   Yes [provider]  metoprolol succinate (TOPROL-XL) 50 MG 24 hr tablet Take 1 tablet (50 mg total) by mouth daily. Take with or immediately following a meal. Patient taking differently: Take 50 mg by mouth daily. 05/24/22  Yes de [place], Raymond J, MD  mupirocin ointment (BACTROBAN) 2 % Apply 1 Application topically 2 (two) times daily. 12/22/22  Yes Fisher, Roselyn Bering, PA-C  rosuvastatin (CRESTOR) 20 MG tablet Take 1 tablet (20 mg total) by mouth daily. 05/24/22 01/18/23 Yes de [place], Raymond J, MD  HYDROcodone-acetaminophen (NORCO/VICODIN) 5-325 MG tablet Take 1-2 tablets by mouth every 4 (four) hours as needed for moderate pain. Patient not taking: Reported on 01/18/2023 09/27/22   Leafy Ro, MD  hydrOXYzine (ATARAX) 10 MG tablet Take 1 tablet (10 mg total) by mouth 3 (three) times daily as needed. Patient not taking: Reported on 01/18/2023 12/22/22   Faythe Ghee, PA-C  isosorbide mononitrate (IMDUR) 30 MG 24 hr tablet Take 0.5 tablets (15 mg total) by mouth daily. Patient not taking: Reported on 01/18/2023 03/29/22   Joylene Grapes, NP  metFORMIN (GLUCOPHAGE-XR) 500 MG 24 hr tablet Take 4 tablets (2,000 mg total) by mouth at bedtime. Patient not taking: Reported on 01/18/2023 05/24/22   de [place], Buren Kos, MD  sertraline (ZOLOFT) 25 MG tablet Take 1 tablet (25 mg total) by mouth daily. Patient not taking: Reported on 01/18/2023 01/18/22   de [place], Raymond J, MD    Physical Exam: Vitals:   01/18/23 1800 01/18/23 1815 01/18/23 1900 01/18/23 1915  BP: (!) 148/95 (!) 148/87 129/69 (!) 155/94  Pulse: 84 90 88 93  Resp:   17 18  Temp:      SpO2: 95% 97% 96% 99%  Weight:      Height:       General: Patient is sitting up in bed with feet on the floor does not appear to be in any immediate distress, on room air Overweight appearing  gentleman Respiratory exam: Bilateral intravesicular, cardiovascular exam no jugular venous distention appreciated S1-S2 normal Abdomen distended all quadrants soft nontender Extremities 2+ edema up to knees.  Data Reviewed:  Labs on Admission:  Results for orders placed or performed during the hospital encounter of 01/18/23 (from the past 24 hour(s))  Troponin I (High Sensitivity)     Status: Abnormal   Collection Time: 01/18/23  6:41 AM  Result Value Ref Range   Troponin I (High Sensitivity) 329 (HH) <18 ng/L  Comprehensive metabolic panel     Status: Abnormal   Collection Time: 01/18/23  6:41 AM  Result Value Ref Range   Sodium 132 (L) 135 - 145 mmol/L   Potassium 4.3 3.5 - 5.1 mmol/L   Chloride 101 98 - 111 mmol/L  CO2 23 22 - 32 mmol/L   Glucose, Bld 489 (H) 70 - 99 mg/dL   BUN 33 (H) 6 - 20 mg/dL   Creatinine, Ser 4.69 (H) 0.61 - 1.24 mg/dL   Calcium 8.3 (L) 8.9 - 10.3 mg/dL   Total Protein 6.1 (L) 6.5 - 8.1 g/dL   Albumin 2.5 (L) 3.5 - 5.0 g/dL   AST 63 (H) 15 - 41 U/L   ALT 49 (H) 0 - 44 U/L   Alkaline Phosphatase 140 (H) 38 - 126 U/L   Total Bilirubin 0.6 0.3 - 1.2 mg/dL   GFR, Estimated 48 (L) >60 mL/min   Anion gap 8 5 - 15  CBC with Differential     Status: None   Collection Time: 01/18/23  6:41 AM  Result Value Ref Range   WBC 7.8 4.0 - 10.5 K/uL   RBC 4.51 4.22 - 5.81 MIL/uL   Hemoglobin 13.6 13.0 - 17.0 g/dL   HCT 62.9 52.8 - 41.3 %   MCV 87.1 80.0 - 100.0 fL   MCH 30.2 26.0 - 34.0 pg   MCHC 34.6 30.0 - 36.0 g/dL   RDW 24.4 01.0 - 27.2 %   Platelets 238 150 - 400 K/uL   nRBC 0.0 0.0 - 0.2 %   Neutrophils Relative % 55 %   Neutro Abs 4.4 1.7 - 7.7 K/uL   Lymphocytes Relative 30 %   Lymphs Abs 2.4 0.7 - 4.0 K/uL   Monocytes Relative 9 %   Monocytes Absolute 0.7 0.1 - 1.0 K/uL   Eosinophils Relative 4 %   Eosinophils Absolute 0.3 0.0 - 0.5 K/uL   Basophils Relative 1 %   Basophils Absolute 0.1 0.0 - 0.1 K/uL   Immature Granulocytes 1 %   Abs  Immature Granulocytes 0.04 0.00 - 0.07 K/uL  Brain natriuretic peptide     Status: Abnormal   Collection Time: 01/18/23  6:41 AM  Result Value Ref Range   B Natriuretic Peptide 141.8 (H) 0.0 - 100.0 pg/mL  Troponin I (High Sensitivity)     Status: Abnormal   Collection Time: 01/18/23  8:51 AM  Result Value Ref Range   Troponin I (High Sensitivity) 376 (HH) <18 ng/L  Hemoglobin A1c     Status: Abnormal   Collection Time: 01/18/23  5:55 PM  Result Value Ref Range   Hgb A1c MFr Bld 11.0 (H) 4.8 - 5.6 %   Mean Plasma Glucose 269 mg/dL  Heparin level (unfractionated)     Status: Abnormal   Collection Time: 01/18/23  5:55 PM  Result Value Ref Range   Heparin Unfractionated <0.10 (L) 0.30 - 0.70 IU/mL   Radiological Exams on Admission:  DG Chest 2 View  Result Date: 01/18/2023 CLINICAL DATA:  52 year old male with history of chest pain and dizziness. EXAM: CHEST - 2 VIEW COMPARISON:  Chest x-ray 12/22/2022. FINDINGS: There is cephalization of the pulmonary vasculature and slight indistinctness of the interstitial markings suggestive of mild pulmonary edema. No definite pleural effusions. No pneumothorax. Heart size is mildly enlarged. Upper mediastinal contours are within normal limits. IMPRESSION: 1. The appearance of the chest is most concerning for mild congestive heart failure, as above. Electronically Signed   By: Trudie Reed M.D.   On: 01/18/2023 06:50    EKG: Independently reviewed. NSR. Some t wave inversion in I and V6 without ST changes.   Assessment and Plan: * NSTEMI (non-ST elevated myocardial infarction) Treat with intravenous heparin, aspirin, statin, beta-blocker.  I have held  patient's amlodipine to give adequate room on blood pressure to titrate up other medications -although cardiology raises no objection to same..  Continue with nitrates as needed to relieve chest discomfort.  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  No plan for coronary angiogram at this time.  Plan she is under considerations would include possible venous thromboembolism given chest pain associated with lower extremity swelling as well as troponin elevation, will check lower extremity Doppler for this purpose.  Factors include known hypertension as well as uncontrolled diabetes  Uncontrolled type 2 diabetes mellitus with hyperglycemia, with long-term current use of insulin We will treat with insulin sliding scale as well as Lantus patient's creatinine is at baseline  Anasarca Working diagnosis is fluid overload from congestive heart failure that is previously known to be preserved ejection fraction.  Repeat echo is pending for the morning.  Other considerations include proteinuria therefore we will check for same it is noted that the patient's liver enzymes are elevated, we will check acute hepatitis panel and liver sonograph      Advance Care Planning:   Code Status: Full Code   Consults: caRdiology  Family Communication: as per patient  Severity of Illness: The appropriate patient status for this patient is INPATIENT. Inpatient status is judged to be reasonable and necessary in order to provide the required intensity of service to ensure the patient's safety. The patient's presenting symptoms, physical exam findings, and initial radiographic and laboratory data in the context of their chronic comorbidities is felt to place them at high risk for further clinical deterioration. Furthermore, it is not anticipated that the patient will be medically stable for discharge from the hospital within 2 midnights of admission.   * I certify that at the point of admission it is my clinical judgment that the patient will require inpatient hospital care spanning beyond 2 midnights from the point of admission due to high intensity of service, high risk for further deterioration and high frequency of surveillance  required.*  Author: Nolberto Hanlon, MD 01/18/2023 7:48 PM  For on call review [email or web address].

## 2023-01-18 NOTE — ED Notes (Signed)
Lab contacted regarding Heparin level, verbalizes processing now

## 2023-01-18 NOTE — Progress Notes (Signed)
Was asked by ED about whether cardiology would be admitting patient. Per hard copy cardiology consultation list, this was marked as cardiology consult rather than admission. Per chart review, our providers placed a cardiology consultative note. It appears the intention was for medicine to admit this patient. Reviewed labs, CBG, Cr, LFTs with Dr. Lynnette Caffey who agrees pt appropriate for hospitalist service. ED PA aware and will admit to medicine. Our team will follow in consultation.

## 2023-01-18 NOTE — ED Provider Notes (Signed)
[place] EMERGENCY DEPARTMENT AT [facility] Provider Note   CSN: [id number] Arrival date & time: 01/18/23  4098     History  Chief Complaint  Patient presents with   Chest Pain    JESSON FOSKEY is a 52 y.o. male.  The history is provided by the patient and medical records. No language interpreter was used.  Chest Pain    52 year old male with multiple comorbidity which includes diabetes, hypertension, CHF, presenting complaining of shortness of breath.  Patient report for the past week he has noticed increased worsening shortness of breath especially at nighttime, along with having increased swelling to both of his lower extremities.  He also endorses constant chest pressure and feels like his lung is filled up with fluid.  He denies having fever or chills no nausea vomiting no abdominal pain or back pain.  He does endorse some mild loose stools.  He is compliant with his medication including his Lasix.  He denies any change in his dietary intake.  He does not weigh himself on a regular basis.  Home Medications Prior to Admission medications   Medication Sig Start Date End Date Taking? Authorizing Provider  albuterol (VENTOLIN HFA) 108 (90 Base) MCG/ACT inhaler Inhale 2 puffs into the lungs every 6 (six) hours as needed for wheezing or shortness of breath. 01/13/23   Leslye Peer, MD  amLODipine (NORVASC) 5 MG tablet Take 1 tablet (5 mg total) by mouth daily. 05/24/22   [place], Buren Kos, MD  blood glucose meter kit and supplies Dispense based on patient and insurance preference. Use up to four times daily as directed. (FOR ICD-10 E10.9, E11.9). 05/24/22   [place], Buren Kos, MD  busPIRone (BUSPAR) 5 MG tablet TAKE 1 & 1/2 (ONE & ONE-HALF) TABLETS BY MOUTH TWICE DAILY 06/14/22   [place], Buren Kos, MD  furosemide (LASIX) 40 MG tablet Take 1 tablet (40 mg total) by mouth daily. 06/06/22   Narda Bonds, MD  HYDROcodone-acetaminophen (NORCO/VICODIN) 5-325 MG tablet Take 1-2  tablets by mouth every 4 (four) hours as needed for moderate pain. 09/27/22   Pabon, Merri Ray, MD  hydrOXYzine (ATARAX) 10 MG tablet Take 1 tablet (10 mg total) by mouth 3 (three) times daily as needed. 12/22/22   Fisher, Roselyn Bering, PA-C  insulin glargine (LANTUS) 100 unit/mL SOPN Inject 26 Units into the skin daily. Patient taking differently: Inject 26 Units into the skin in the morning. 05/24/22   [place], Buren Kos, MD  Insulin Pen Needle (PEN NEEDLES 3/16") 31G X 5 MM MISC 1 each by Does not apply route daily. 01/18/22   [place], Raymond J, MD  isosorbide mononitrate (IMDUR) 30 MG 24 hr tablet Take 0.5 tablets (15 mg total) by mouth daily. 03/29/22   Joylene Grapes, NP  JARDIANCE 10 MG TABS tablet Take 1 tablet (10 mg total) by mouth every morning. 05/24/22   [place], Buren Kos, MD  metFORMIN (GLUCOPHAGE-XR) 500 MG 24 hr tablet Take 4 tablets (2,000 mg total) by mouth at bedtime. Patient taking differently: Take 1,000 mg by mouth at bedtime. 05/24/22   [place], Buren Kos, MD  metoprolol succinate (TOPROL-XL) 50 MG 24 hr tablet Take 1 tablet (50 mg total) by mouth daily. Take with or immediately following a meal. Patient taking differently: Take 50 mg by mouth daily. 05/24/22   [place], Buren Kos, MD  mupirocin ointment (BACTROBAN) 2 % Apply 1 Application topically 2 (two) times daily.  12/22/22   Fisher, Roselyn Bering, PA-C  rosuvastatin (CRESTOR) 20 MG tablet Take 1 tablet (20 mg total) by mouth daily. 05/24/22 08/22/22  [place], Raymond J, MD  sertraline (ZOLOFT) 25 MG tablet Take 1 tablet (25 mg total) by mouth daily. 01/18/22   [place], Raymond J, MD      Allergies    Patient has no known allergies.    Review of Systems   Review of Systems  Cardiovascular:  Positive for chest pain.  All other systems reviewed and are negative.   Physical Exam Updated Vital Signs BP (!) 183/108 (BP Location: Right Arm)   Pulse 95   Temp 98.5 F (36.9 C)   Resp 18   SpO2 95%  Physical Exam Vitals and nursing  note reviewed.  Constitutional:      General: He is not in acute distress.    Appearance: He is well-developed.  HENT:     Head: Atraumatic.  Eyes:     Conjunctiva/sclera: Conjunctivae normal.  Neck:     Comments: No JVD Cardiovascular:     Rate and Rhythm: Normal rate and regular rhythm.     Pulses: Normal pulses.     Heart sounds: Normal heart sounds.  Pulmonary:     Breath sounds: Rales present. No wheezing or rhonchi.  Abdominal:     Palpations: Abdomen is soft.     Tenderness: There is no abdominal tenderness.  Musculoskeletal:     Cervical back: Neck supple.     Right lower leg: Edema present.     Left lower leg: Edema present.  Skin:    Findings: No rash.  Neurological:     Mental Status: He is alert. Mental status is at baseline.  Psychiatric:        Mood and Affect: Mood normal.     ED Results / Procedures / Treatments   Labs (all labs ordered are listed, but only abnormal results are displayed) Labs Reviewed  COMPREHENSIVE METABOLIC PANEL - Abnormal; Notable for the following components:      Result Value   Sodium 132 (*)    Glucose, Bld 489 (*)    BUN 33 (*)    Creatinine, Ser 1.71 (*)    Calcium 8.3 (*)    Total Protein 6.1 (*)    Albumin 2.5 (*)    AST 63 (*)    ALT 49 (*)    Alkaline Phosphatase 140 (*)    GFR, Estimated 48 (*)    All other components within normal limits  BRAIN NATRIURETIC PEPTIDE - Abnormal; Notable for the following components:   B Natriuretic Peptide 141.8 (*)    All other components within normal limits  TROPONIN I (HIGH SENSITIVITY) - Abnormal; Notable for the following components:   Troponin I (High Sensitivity) 329 (*)    All other components within normal limits  TROPONIN I (HIGH SENSITIVITY) - Abnormal; Notable for the following components:   Troponin I (High Sensitivity) 376 (*)    All other components within normal limits  CBC WITH DIFFERENTIAL/PLATELET  HEPARIN LEVEL (UNFRACTIONATED)    EKG EKG  Interpretation  Date/Time:  Wednesday January 18 2023 08:12:44 EDT Ventricular Rate:  93 PR Interval:  147 QRS Duration: 77 QT Interval:  358 QTC Calculation: 446 R Axis:   42 Text Interpretation: Sinus rhythm Borderline repolarization abnormality No significant change since last tracing Confirmed by Melene Plan [phone]) on 01/18/2023 8:34:28 AM  Date: 01/18/2023  Rate: 93  Rhythm: normal sinus rhythm  QRS Axis: normal  Intervals: normal  ST/T Wave abnormalities: normal  Conduction Disutrbances: none  Narrative Interpretation:   Old EKG Reviewed: No significant changes noted    Radiology DG Chest 2 View  Result Date: 01/18/2023 CLINICAL DATA:  52 year old male with history of chest pain and dizziness. EXAM: CHEST - 2 VIEW COMPARISON:  Chest x-ray 12/22/2022. FINDINGS: There is cephalization of the pulmonary vasculature and slight indistinctness of the interstitial markings suggestive of mild pulmonary edema. No definite pleural effusions. No pneumothorax. Heart size is mildly enlarged. Upper mediastinal contours are within normal limits. IMPRESSION: 1. The appearance of the chest is most concerning for mild congestive heart failure, as above. Electronically Signed   By: Trudie Reed M.D.   On: 01/18/2023 06:50    Procedures .Critical Care  Performed by: Fayrene Helper, PA-C Authorized by: Fayrene Helper, PA-C   Critical care provider statement:    Critical care time (minutes):  35   Critical care was time spent personally by me on the following activities:  Development of treatment plan with patient or surrogate, discussions with consultants, evaluation of patient's response to treatment, examination of patient, ordering and review of laboratory studies, ordering and review of radiographic studies, ordering and performing treatments and interventions, pulse oximetry, re-evaluation of patient's condition and review of old charts     Medications Ordered in ED Medications  nitroGLYCERIN  50 mg in dextrose 5 % 250 mL (0.2 mg/mL) infusion (80 mcg/min Intravenous Infusion Verify 01/18/23 1141)  heparin ADULT infusion 100 units/mL (25000 units/251mL) (1,100 Units/hr Intravenous Infusion Verify 01/18/23 1141)  furosemide (LASIX) injection 40 mg (40 mg Intravenous Given 01/18/23 0814)  aspirin chewable tablet 324 mg (324 mg Oral Given 01/18/23 0833)  heparin bolus via infusion 4,000 Units (4,000 Units Intravenous Bolus from Bag 01/18/23 0911)  morphine (PF) 4 MG/ML injection 4 mg (4 mg Intravenous Given 01/18/23 1107)    ED Course/ Medical Decision Making/ A&P                             Medical Decision Making Amount and/or Complexity of Data Reviewed Labs: ordered. Radiology: ordered. ECG/medicine tests: ordered.  Risk OTC drugs. Prescription drug management. Decision regarding hospitalization.   BP (!) 183/108 (BP Location: Right Arm)   Pulse 95   Temp 98.5 F (36.9 C)   Resp 18   SpO2 95%   48:77 AM  52 year old male with multiple comorbidity which includes diabetes, hypertension, CHF, presenting complaining of shortness of breath.  Patient report for the past week he has noticed increased worsening shortness of breath especially at nighttime, along with having increased swelling to both of his lower extremities.  He also endorses constant chest pressure and feels like his lung is filled up with fluid.  He denies having fever or chills no nausea vomiting no abdominal pain or back pain.  He does endorse some mild loose stools.  He is compliant with his medication including his Lasix.  He denies any change in his dietary intake.  He does not weigh himself on a regular basis.  On exam this is an obese male resting comfortably in the bed appears to be in no acute discomfort.  Heart with normal rate and rhythm, lungs with decreased breath sounds and faint crackles at the lung base.  No JVD present.  Patient does have 3+ pitting edema to bilateral lower extremity extending towards  the knees.  Vital signs notable for elevated  blood pressure of 183/108.  Patient is afebrile no hypoxia.  -Labs ordered, independently viewed and interpreted by me.  Labs remarkable for trop 329, with normal EKG.  Suspect demand ischemia.  BNP is elevated at 141.8. Cr 1.71, it was 1.33 three months ago.  CBG today is markedly elevated at 489 without DKA.  Mild transaminitis.    -The patient was maintained on a cardiac monitor.  I personally viewed and interpreted the cardiac monitored which showed an underlying rhythm of: NSR -Imaging independently viewed and interpreted by me and I agree with radiologist's interpretation.  Result remarkable for CXR showing mild CHF, no PNA -This patient presents to the ED for concern of SOB, this involves an extensive number of treatment options, and is a complaint that carries with it a high risk of complications and morbidity.  The differential diagnosis includes CHF exacerbation, MI, PNA, COPD, anemia -Co morbidities that complicate the patient evaluation includes CHF, HTN, DM -Treatment includes Lasix, nitro, ASA, heparin -Reevaluation of the patient after these medicines showed that the patient improved -PCP office notes or outside notes reviewed -Discussion with specialist cardiology team who will see and admit pt -Escalation to admission/observation considered: patient is agreeable with admission EMR review, echocardiogram of last year shows an EF of 55 to 60% with indeterminate diastolic function.  His coronary CTA demonstrated a calcium score 431 with significant flow-limiting lesion in the LAD with subsequent cardiac cath revealed multivessel disease.  Today's labs remarkable for elevated troponin of 329 likely demand ischemia, as EKG overall reassuring.  However with significant history of coronary disease, I have ordered heparin, nitroglycerin drips, and will consult cardiology for recommendation.  8:48 AM Cardiology team has been consulted and will  evaluate determine disposition.  10:56 AM On reassessment, patient states he did felt a bit better after the initial administration of medication but now he feels like his chest pressure has returned.  Repeat troponin shows a slight increase from 329 to now 376.  Morphine given.  Blood pressure is improving.  Awaits cardiology team to evaluate patient.        Final Clinical Impression(s) / ED Diagnoses Final diagnoses:  NSTEMI (non-ST elevated myocardial infarction)  Acute on chronic congestive heart failure, unspecified heart failure type    Rx / DC Orders ED Discharge Orders     None         Fayrene Helper, PA-C 01/18/23 1409    Melene Plan, DO 01/18/23 1457

## 2023-01-19 ENCOUNTER — Inpatient Hospital Stay (HOSPITAL_COMMUNITY): Payer: 59

## 2023-01-19 ENCOUNTER — Other Ambulatory Visit: Payer: Self-pay

## 2023-01-19 ENCOUNTER — Encounter (HOSPITAL_COMMUNITY): Payer: 59

## 2023-01-19 DIAGNOSIS — E1165 Type 2 diabetes mellitus with hyperglycemia: Secondary | ICD-10-CM | POA: Diagnosis not present

## 2023-01-19 DIAGNOSIS — N179 Acute kidney failure, unspecified: Secondary | ICD-10-CM

## 2023-01-19 DIAGNOSIS — M7989 Other specified soft tissue disorders: Secondary | ICD-10-CM

## 2023-01-19 DIAGNOSIS — Z794 Long term (current) use of insulin: Secondary | ICD-10-CM

## 2023-01-19 DIAGNOSIS — R9431 Abnormal electrocardiogram [ECG] [EKG]: Secondary | ICD-10-CM | POA: Diagnosis not present

## 2023-01-19 DIAGNOSIS — E871 Hypo-osmolality and hyponatremia: Secondary | ICD-10-CM

## 2023-01-19 DIAGNOSIS — I509 Heart failure, unspecified: Secondary | ICD-10-CM | POA: Diagnosis not present

## 2023-01-19 DIAGNOSIS — R7989 Other specified abnormal findings of blood chemistry: Secondary | ICD-10-CM

## 2023-01-19 LAB — COMPREHENSIVE METABOLIC PANEL
ALT: 35 U/L (ref 0–44)
AST: 24 U/L (ref 15–41)
Albumin: 2.4 g/dL — ABNORMAL LOW (ref 3.5–5.0)
Alkaline Phosphatase: 128 U/L — ABNORMAL HIGH (ref 38–126)
Anion gap: 11 (ref 5–15)
BUN: 36 mg/dL — ABNORMAL HIGH (ref 6–20)
CO2: 25 mmol/L (ref 22–32)
Calcium: 8.2 mg/dL — ABNORMAL LOW (ref 8.9–10.3)
Chloride: 98 mmol/L (ref 98–111)
Creatinine, Ser: 1.97 mg/dL — ABNORMAL HIGH (ref 0.61–1.24)
GFR, Estimated: 41 mL/min — ABNORMAL LOW (ref >60)
Glucose, Bld: 346 mg/dL — ABNORMAL HIGH (ref 70–99)
Potassium: 4 mmol/L (ref 3.5–5.1)
Sodium: 134 mmol/L — ABNORMAL LOW (ref 135–145)
Total Bilirubin: 0.8 mg/dL (ref 0.3–1.2)
Total Protein: 5.7 g/dL — ABNORMAL LOW (ref 6.5–8.1)

## 2023-01-19 LAB — ECHOCARDIOGRAM LIMITED
AR max vel: 3.13 cm2
AV Area VTI: 3.45 cm2
AV Area mean vel: 3.73 cm2
AV Mean grad: 3 mmHg
AV Peak grad: 5.4 mmHg
Ao pk vel: 1.16 m/s
Area-P 1/2: 4.6 cm2
Calc EF: 56.4 %
Height: 68 in
MV VTI: 1.89 cm2
S' Lateral: 3.2 cm
Single Plane A2C EF: 57.6 %
Single Plane A4C EF: 58.7 %
Weight: 3680 oz

## 2023-01-19 LAB — GLUCOSE, CAPILLARY
Glucose-Capillary: 303 mg/dL — ABNORMAL HIGH (ref 70–99)
Glucose-Capillary: 276 mg/dL — ABNORMAL HIGH (ref 70–99)
Glucose-Capillary: 258 mg/dL — ABNORMAL HIGH (ref 70–99)
Glucose-Capillary: 245 mg/dL — ABNORMAL HIGH (ref 70–99)

## 2023-01-19 LAB — BASIC METABOLIC PANEL
Anion gap: 7 (ref 5–15)
BUN: 36 mg/dL — ABNORMAL HIGH (ref 6–20)
CO2: 25 mmol/L (ref 22–32)
Calcium: 8 mg/dL — ABNORMAL LOW (ref 8.9–10.3)
Chloride: 100 mmol/L (ref 98–111)
Creatinine, Ser: 1.86 mg/dL — ABNORMAL HIGH (ref 0.61–1.24)
GFR, Estimated: 44 mL/min — ABNORMAL LOW (ref >60)
Glucose, Bld: 353 mg/dL — ABNORMAL HIGH (ref 70–99)
Potassium: 3.9 mmol/L (ref 3.5–5.1)
Sodium: 132 mmol/L — ABNORMAL LOW (ref 135–145)

## 2023-01-19 LAB — CBC
HCT: 35.4 % — ABNORMAL LOW (ref 39.0–52.0)
HCT: 35.6 % — ABNORMAL LOW (ref 39.0–52.0)
Hemoglobin: 12.2 g/dL — ABNORMAL LOW (ref 13.0–17.0)
Hemoglobin: 12.3 g/dL — ABNORMAL LOW (ref 13.0–17.0)
MCH: 30 pg (ref 26.0–34.0)
MCH: 30 pg (ref 26.0–34.0)
MCHC: 34.5 g/dL (ref 30.0–36.0)
MCHC: 34.6 g/dL (ref 30.0–36.0)
MCV: 87.2 fL (ref 80.0–100.0)
MCV: 86.8 fL (ref 80.0–100.0)
Platelets: 224 10*3/uL (ref 150–400)
Platelets: 209 10*3/uL (ref 150–400)
RBC: 4.06 MIL/uL — ABNORMAL LOW (ref 4.22–5.81)
RBC: 4.1 MIL/uL — ABNORMAL LOW (ref 4.22–5.81)
RDW: 12.5 % (ref 11.5–15.5)
RDW: 12.5 % (ref 11.5–15.5)
WBC: 11.1 10*3/uL — ABNORMAL HIGH (ref 4.0–10.5)
WBC: 10.3 10*3/uL (ref 4.0–10.5)
nRBC: 0 % (ref 0.0–0.2)
nRBC: 0 % (ref 0.0–0.2)

## 2023-01-19 LAB — MAGNESIUM: Magnesium: 1.8 mg/dL (ref 1.7–2.4)

## 2023-01-19 LAB — APTT: aPTT: 52 seconds — ABNORMAL HIGH (ref 24–36)

## 2023-01-19 LAB — PROTEIN / CREATININE RATIO, URINE
Creatinine, Urine: 82 mg/dL
Protein Creatinine Ratio: 5.2 mg/mg{Cre} — ABNORMAL HIGH (ref 0.00–0.15)
Total Protein, Urine: 426 mg/dL

## 2023-01-19 LAB — LIPID PANEL
Cholesterol: 218 mg/dL — ABNORMAL HIGH (ref 0–200)
HDL: 47 mg/dL (ref >40)
LDL Cholesterol: 125 mg/dL — ABNORMAL HIGH (ref 0–99)
Total CHOL/HDL Ratio: 4.6 RATIO
Triglycerides: 228 mg/dL — ABNORMAL HIGH (ref <150)
VLDL: 46 mg/dL — ABNORMAL HIGH (ref 0–40)

## 2023-01-19 LAB — HEPARIN LEVEL (UNFRACTIONATED)
Heparin Unfractionated: 0.13 IU/mL — ABNORMAL LOW (ref 0.30–0.70)
Heparin Unfractionated: 0.26 IU/mL — ABNORMAL LOW (ref 0.30–0.70)
Heparin Unfractionated: 0.26 IU/mL — ABNORMAL LOW (ref 0.30–0.70)
Heparin Unfractionated: 0.26 IU/mL — ABNORMAL LOW (ref 0.30–0.70)

## 2023-01-19 LAB — PROTIME-INR
INR: 1.1 (ref 0.8–1.2)
Prothrombin Time: 14.1 seconds (ref 11.4–15.2)

## 2023-01-19 LAB — TROPONIN I (HIGH SENSITIVITY): Troponin I (High Sensitivity): 195 ng/L (ref <18)

## 2023-01-19 MED ORDER — AMLODIPINE BESYLATE 5 MG PO TABS
5.0000 mg | ORAL_TABLET | Freq: Every day | ORAL | Status: DC
  Administered 2023-01-19 – 2023-01-24 (×6): 5 mg via ORAL
  Filled 2023-01-19 (×6): qty 1

## 2023-01-19 MED ORDER — ACETAMINOPHEN 325 MG PO TABS
650.0000 mg | ORAL_TABLET | Freq: Four times a day (QID) | ORAL | Status: DC | PRN
  Administered 2023-01-19 – 2023-01-20 (×2): 650 mg via ORAL
  Filled 2023-01-19 (×3): qty 2

## 2023-01-19 MED ORDER — ATORVASTATIN CALCIUM 80 MG PO TABS
80.0000 mg | ORAL_TABLET | Freq: Every day | ORAL | Status: DC
  Administered 2023-01-19 – 2023-01-24 (×6): 80 mg via ORAL
  Filled 2023-01-19 (×7): qty 1

## 2023-01-19 MED ORDER — ALBUTEROL SULFATE (2.5 MG/3ML) 0.083% IN NEBU: 2.5000 mg | INHALATION_SOLUTION | Freq: Four times a day (QID) | RESPIRATORY_TRACT | Status: DC | PRN

## 2023-01-19 MED ORDER — INSULIN ASPART 100 UNIT/ML IJ SOLN
0.0000 [IU] | Freq: Three times a day (TID) | INTRAMUSCULAR | Status: DC
  Administered 2023-01-19: 11 [IU] via SUBCUTANEOUS

## 2023-01-19 MED ORDER — INSULIN ASPART 100 UNIT/ML IJ SOLN
0.0000 [IU] | Freq: Every day | INTRAMUSCULAR | Status: DC
  Administered 2023-01-19: 2 [IU] via SUBCUTANEOUS
  Administered 2023-01-20 – 2023-01-21 (×2): 3 [IU] via SUBCUTANEOUS
  Administered 2023-01-22 – 2023-01-23 (×2): 2 [IU] via SUBCUTANEOUS

## 2023-01-19 MED ORDER — INSULIN GLARGINE-YFGN 100 UNIT/ML ~~LOC~~ SOLN
15.0000 [IU] | Freq: Two times a day (BID) | SUBCUTANEOUS | Status: DC
  Administered 2023-01-19 – 2023-01-23 (×8): 15 [IU] via SUBCUTANEOUS
  Filled 2023-01-19 (×10): qty 0.15

## 2023-01-19 MED ORDER — INSULIN ASPART 100 UNIT/ML IJ SOLN: 0.0000 [IU] | Freq: Every day | INTRAMUSCULAR | Status: DC

## 2023-01-19 MED ORDER — INSULIN GLARGINE-YFGN 100 UNIT/ML ~~LOC~~ SOLN
26.0000 [IU] | Freq: Every day | SUBCUTANEOUS | Status: DC
  Filled 2023-01-19: qty 0.26

## 2023-01-19 MED ORDER — INSULIN GLARGINE-YFGN 100 UNIT/ML ~~LOC~~ SOLN
12.0000 [IU] | Freq: Two times a day (BID) | SUBCUTANEOUS | Status: DC
  Administered 2023-01-19: 12 [IU] via SUBCUTANEOUS
  Filled 2023-01-19 (×2): qty 0.12

## 2023-01-19 MED ORDER — HEPARIN BOLUS VIA INFUSION
2000.0000 [IU] | Freq: Once | INTRAVENOUS | Status: AC
  Administered 2023-01-19: 2000 [IU] via INTRAVENOUS
  Filled 2023-01-19: qty 2000

## 2023-01-19 MED ORDER — INSULIN ASPART 100 UNIT/ML IJ SOLN
3.0000 [IU] | Freq: Three times a day (TID) | INTRAMUSCULAR | Status: DC
  Administered 2023-01-19 – 2023-01-20 (×4): 3 [IU] via SUBCUTANEOUS

## 2023-01-19 MED ORDER — INSULIN ASPART 100 UNIT/ML IJ SOLN
0.0000 [IU] | Freq: Three times a day (TID) | INTRAMUSCULAR | Status: DC
  Administered 2023-01-19 (×2): 5 [IU] via SUBCUTANEOUS
  Administered 2023-01-20: 3 [IU] via SUBCUTANEOUS
  Administered 2023-01-20: 5 [IU] via SUBCUTANEOUS
  Administered 2023-01-20: 3 [IU] via SUBCUTANEOUS
  Administered 2023-01-21 – 2023-01-22 (×4): 2 [IU] via SUBCUTANEOUS
  Administered 2023-01-22: 3 [IU] via SUBCUTANEOUS
  Administered 2023-01-23: 2 [IU] via SUBCUTANEOUS
  Administered 2023-01-23 (×2): 3 [IU] via SUBCUTANEOUS
  Administered 2023-01-24: 2 [IU] via SUBCUTANEOUS
  Administered 2023-01-24: 3 [IU] via SUBCUTANEOUS

## 2023-01-19 MED ORDER — ASPIRIN 81 MG PO TBEC
81.0000 mg | DELAYED_RELEASE_TABLET | Freq: Every day | ORAL | Status: DC
  Administered 2023-01-19 – 2023-01-24 (×6): 81 mg via ORAL
  Filled 2023-01-19 (×6): qty 1

## 2023-01-19 MED ORDER — ACETAMINOPHEN 650 MG RE SUPP: 650.0000 mg | Freq: Four times a day (QID) | RECTAL | Status: DC | PRN

## 2023-01-19 MED ORDER — ORAL CARE MOUTH RINSE: 15.0000 mL | OROMUCOSAL | Status: DC | PRN

## 2023-01-19 MED ORDER — AMLODIPINE BESYLATE 10 MG PO TABS: 10.0000 mg | ORAL_TABLET | Freq: Every day | ORAL | Status: DC

## 2023-01-19 NOTE — Progress Notes (Signed)
Rounding Note    Patient Name: Leroy Simmons Date of Encounter: 01/19/2023  [facility] Cardiologist: Parke Poisson, MD   Subjective   Feeling short of breath.  Reports chest discomfort that he attributes to his breathing.   Inpatient Medications    Scheduled Meds:  aspirin EC  81 mg Oral Daily   atorvastatin  80 mg Oral Daily   furosemide  40 mg Intravenous BID   hydrALAZINE  25 mg Oral Q8H   insulin aspart  0-5 Units Subcutaneous QHS   insulin aspart  0-9 Units Subcutaneous TID WC   insulin aspart  3 Units Subcutaneous TID WC   insulin glargine-yfgn  12 Units Subcutaneous BID   isosorbide mononitrate  30 mg Oral Daily   metoprolol succinate  50 mg Oral Daily   Continuous Infusions:  heparin 1,700 Units/hr (01/19/23 0339)   nitroGLYCERIN 80 mcg/min (01/19/23 0919)   PRN Meds: acetaminophen **OR** acetaminophen, albuterol, mouth rinse   Vital Signs    Vitals:   01/19/23 0434 01/19/23 0919 01/19/23 0926 01/19/23 1020  BP: (!) 146/89 119/74 118/77 104/68  Pulse: 91 91 92 90  Resp: (!) 24  Temp: 98.7 F (37.1 C) 98.7 F (37.1 C)    TempSrc: Oral Oral    SpO2: 99% 91% 97% 98%  Weight:      Height:        Intake/Output Summary (Last 24 hours) at 01/19/2023 1103 Last data filed at 01/19/2023 0919 Gross per 24 hour  Intake 969.59 ml  Output 1800 ml  Net -830.41 ml      01/18/2023    8:49 AM 01/13/2023    2:48 PM 12/22/2022    3:40 PM  Last 3 Weights  Weight (lbs) 230 lb 235 lb 3.2 oz 225 lb  Weight (kg) 104.327 kg 106.686 kg 102.059 kg      Telemetry    Sinus rhythm - Personally Reviewed  ECG     Sinus rhythm.  Rate 92 bpm.  Inferolateral TWI - Personally Reviewed  Physical Exam   VS:  BP 104/68 (BP Location: Left Arm)   Pulse 90   Temp 98.7 F (37.1 C) (Oral)   Resp (!) 24   Ht  (1.727 m)   Wt 104.3 kg   SpO2 98%   BMI 34.97 kg/m  , BMI Body mass index is 34.97 kg/m. GENERAL:  Chronically  ill-appearing HEENT: Pupils equal round and reactive, fundi not visualized, oral mucosa unremarkable NECK:  + jugular venous distention, waveform within normal limits, carotid upstroke brisk and symmetric, no bruits, no thyromegaly LUNGS: Diminished breath sounds.  No crackles.  HEART:  RRR.  PMI not displaced or sustained,S1 and S2 within normal limits, no S3, no S4, no clicks, no rubs, no murmurs ABD:  Flat, positive bowel sounds normal in frequency in pitch, no bruits, no rebound, no guarding, no midline pulsatile mass, no hepatomegaly, no splenomegaly EXT:  Anasarca.  No cyanosis no clubbing SKIN:  No rashes no nodules NEURO:  Cranial nerves II through XII grossly intact, motor grossly intact throughout PSYCH:  Cognitively intact, oriented to person place and time  Labs    High Sensitivity Troponin:   Recent Labs  Lab 01/18/23 0641 01/18/23 0851 01/19/23 0617  TROPONINIHS 329* 376* 195*     Chemistry Recent Labs  Lab 01/18/23 0641 01/19/23 0214 01/19/23 0617  NA 132* 132* 134*  K 4.3 3.9 4.0  CL 101 100 98  CO2  23 25 25   GLUCOSE 489* 353* 346*  BUN 33* 36* 36*  CREATININE 1.71* 1.86* 1.97*  CALCIUM 8.3* 8.0* 8.2*  MG  --  1.8  --   PROT 6.1*  --  5.7*  ALBUMIN 2.5*  --  2.4*  AST 63*  --  24  ALT 49*  --  35  ALKPHOS 140*  --  128*  BILITOT 0.6  --  0.8  GFRNONAA 48* 44* 41*  ANIONGAP 8 7 11     Lipids  Recent Labs  Lab 01/19/23 0617  CHOL 218*  TRIG 228*  HDL 47  LDLCALC 125*  CHOLHDL 4.6    Hematology Recent Labs  Lab 01/18/23 0641 01/19/23 0214 01/19/23 0617  WBC 7.8 11.1* 10.3  RBC 4.51 4.06* 4.10*  HGB 13.6 12.2* 12.3*  HCT 39.3 35.4* 35.6*  MCV 87.1 87.2 86.8  MCH 30.2 30.0 30.0  MCHC 34.6 34.5 34.6  RDW 12.3 12.5 12.5  PLT 238 224 209   Thyroid No results for input(s): "TSH", "FREET4" in the last 168 hours.  BNP Recent Labs  Lab 01/18/23 0641  BNP 141.8*    DDimer No results for input(s): "DDIMER" in the last 168 hours.    Radiology    US Abdomen Limited RUQ (LIVER/GB)  Result Date: 01/18/2023 CLINICAL DATA:  [id number] with hepatitis. EXAM: ULTRASOUND ABDOMEN LIMITED RIGHT UPPER QUADRANT COMPARISON:  CT with IV contrast 09/25/2022 FINDINGS: Gallbladder: No gallstones or wall thickening visualized. No sonographic Murphy sign noted by sonographer. Common bile duct: Diameter: 4.0 mm with no intrahepatic biliary prominence. Liver: No focal lesion identified. There is mild generalized increased hepatic echogenicity consistent with the steatosis noted on the prior CT. Portal vein is patent on color Doppler imaging with normal direction of blood flow towards the liver. The portal vein again measures slightly prominent at 15 mm. This was seen previously. Other: None. IMPRESSION: 1. No gallstones, wall thickening or biliary dilatation. 2. Mild hepatic steatosis. Electronically Signed   By: Almira Bar M.D.   On: 01/18/2023 20:53   DG Chest 2 View  Result Date: 01/18/2023 CLINICAL DATA:  52 year old male with history of chest pain and dizziness. EXAM: CHEST - 2 VIEW COMPARISON:  Chest x-ray 12/22/2022. FINDINGS: There is cephalization of the pulmonary vasculature and slight indistinctness of the interstitial markings suggestive of mild pulmonary edema. No definite pleural effusions. No pneumothorax. Heart size is mildly enlarged. Upper mediastinal contours are within normal limits. IMPRESSION: 1. The appearance of the chest is most concerning for mild congestive heart failure, as above. Electronically Signed   By: Trudie Reed M.D.   On: 01/18/2023 06:50    Cardiac Studies   Echo 09/27/22: IMPRESSIONS    1. Left ventricular ejection fraction, by estimation, is 55 to 60%. The left ventricle has normal function. The left ventricle has no regional wall motion abnormalities. There is mild left ventricular hypertrophy. Left ventricular diastolic parameters are consistent with Grade II diastolic dysfunction  (pseudonormalization).  2. Right ventricular systolic function is normal. The right ventricular size is normal. Tricuspid regurgitation signal is inadequate for assessing PA pressure.  3. Left atrial size was mildly dilated.  4. The mitral valve is normal in structure. Trivial mitral valve regurgitation. No evidence of mitral stenosis.  5. The aortic valve is normal in structure. Aortic valve regurgitation is not visualized. Aortic valve sclerosis/calcification is present, without any evidence of aortic stenosis.  6. The inferior vena cava is dilated in size with >50% respiratory variability, suggesting  right atrial pressure of 8 mmHg.   Patient Profile     Mr. Lembke is a 20M with HFpEF, non-obstructive CAD, hypertension, hyperlipidemia, anxiety, depression, and asthma admitted with acute on chronic HFpEF.    Assessment & Plan    # Acute on chronic HFpEF: # Hypertension: Patient remains massively volume overloaded.  He he has hyperverbal hemic hyponatremia.  Continue diuresis.  Will need to monitor renal function as he is above his baseline creatinine of 1.7.  Given his AKI, we will not further titrate his ACE inhibitor at this time.  If renal function permits after diuresis, ideally we would like to get him on Entresto.  Hemoglobin is A1c is 11%.  Holding [place].  For now, continue to wean nitroglycerin drip and resume home Imdur.  Adding amlodipine .  I think his current edema is more from heart failure than medication intolerance.  Continue metoprolol for now.  We can consider switching to carvedilol if blood pressure remains uncontrolled.  Repeat echocardiogram to ensure there is no change in his systolic function.   # Nonobstructive CAD: # Atypical chest pain: # Demand ischemia: # Hyperlipidemia: High-sensitivity troponin was elevated to 376 and is down trending.  He does report chest pain, but he describes it as heaviness and attributes it to his shortness of breath.  I  think this is less likely to be ischemic.  He is massively volume overloaded and his troponin is likely elevated in the setting of demand ischemia.  We are giving 48 hours of heparin.  He had a left heart cath 03/2022 that revealed 50% D2, 40% mid LAD, and 25% ramus lesions.  Continue with medical management for now.  If he has chest discomfort once more euvolemic we will consider an ischemic evaluation.  Continue metoprolol, Imdur, and atorvastatin.      For questions or updates, please contact [facility] Please consult [email or web address] for contact info under        Signed, Chilton Si, MD  01/19/2023, 11:03 AM

## 2023-01-19 NOTE — Progress Notes (Signed)
ANTICOAGULATION CONSULT NOTE  Pharmacy Consult for heparin Indication: chest pain/ACS Brief A/P: Heparin level subtherapeutic Increase Heparin rate  No Known Allergies  Patient Measurements: Height:  (172.7 cm) Weight: 104.3 kg (230 lb) IBW/kg (Calculated) : 68.4 Heparin Dosing Weight: 91.1kg  Vital Signs: Temp: 98.5 F (36.9 C) (04/18 0010) Temp Source: Oral (04/18 0010) BP: 130/85 (04/18 0200) Pulse Rate: 93 (04/18 0200)  Labs: Recent Labs    01/18/23 0641 01/18/23 0851 01/18/23 1755 01/19/23 0214  HGB 13.6  --   --  12.2*  HCT 39.3  --   --  35.4*  PLT 238  --   --  224  HEPARINUNFRC  --   --  <0.10* 0.13*  CREATININE 1.71*  --   --  1.86*  TROPONINIHS 329* 376*  --   --      Estimated Creatinine Clearance: 56.3 mL/min (A) (by C-G formula based on SCr of 1.86 mg/dL (H)).   Assessment: 52 y.o. male with chest pain for heparin   Goal of Therapy:  Heparin level 0.3-0.7 units/ml Monitor platelets by anticoagulation protocol: Yes   Plan:  Heparin 2000 units IV bolus, then increase heparin  1700 units/hr Check heparin level in 6 hours.   Geannie Risen, PharmD, BCPS  4/18/20243:23 AM

## 2023-01-19 NOTE — Progress Notes (Signed)
ANTICOAGULATION CONSULT NOTE - Follow Up Consult  Pharmacy Consult for Heparin Indication: chest pain/ACS  No Known Allergies  Patient Measurements: Height:  (172.7 cm) Weight: 104.3 kg (230 lb) IBW/kg (Calculated) : 68.4 Heparin Dosing Weight: 91.1 kg  Vital Signs: Temp: 98.8 F (37.1 C) (04/18 1900) Temp Source: Oral (04/18 1900) BP: 139/77 (04/18 1900) Pulse Rate: 92 (04/18 1900)  Labs: Recent Labs    01/18/23 0641 01/18/23 0851 01/18/23 1755 01/19/23 0214 01/19/23 0617 01/19/23 1010 01/19/23 2009  HGB 13.6  --   --  12.2* 12.3*  --   --   HCT 39.3  --   --  35.4* 35.6*  --   --   PLT 238  --   --  224 209  --   --   APTT  --   --   --   --  52*  --   --   LABPROT  --   --   --   --  14.1  --   --   INR  --   --   --   --  1.1  --   --   HEPARINUNFRC  --   --    < > 0.13* 0.26* 0.26* 0.26*  CREATININE 1.71*  --   --  1.86* 1.97*  --   --   TROPONINIHS 329* 376*  --   --  195*  --   --    < > = values in this interval not displayed.     Estimated Creatinine Clearance: 53.1 mL/min (A) (by C-G formula based on SCr of 1.97 mg/dL (H)).   Medications:  Scheduled:   amLODipine  5 mg Oral Daily   aspirin EC  81 mg Oral Daily   atorvastatin  80 mg Oral Daily   furosemide  40 mg Intravenous BID   hydrALAZINE  25 mg Oral Q8H   insulin aspart  0-5 Units Subcutaneous QHS   insulin aspart  0-9 Units Subcutaneous TID WC   insulin aspart  3 Units Subcutaneous TID WC   insulin glargine-yfgn  15 Units Subcutaneous BID   isosorbide mononitrate  30 mg Oral Daily   metoprolol succinate  50 mg Oral Daily   Infusions:   heparin 1,900 Units/hr (01/19/23 1507)   nitroGLYCERIN 75 mcg/min (01/19/23 1734)    Assessment: 52 yo male on heparin for chest pain. Troponin now downtrending. Planning to continue heparin for 48 hours per cards recs.   Heparin level 0.26 subtherapeutic, despite rate increase on 1900 units/hr. No issues with line or infusion and no s/sx bleeding  per RN  Goal of Therapy:  Heparin level 0.3-0.7 units/ml Monitor platelets by anticoagulation protocol: Yes   Plan:  Increase heparin to 2100 units/hr Heparin level in 6 hours Daily heparin level and CBC while on heparin Patient will have completed ~48 hours of heparin 4/19 ~ 0800.   Calton Dach, PharmD Clinical Pharmacist 01/19/2023 9:04 PM

## 2023-01-19 NOTE — Progress Notes (Signed)
   01/19/23 0919  Vitals  BP 119/74  MAP (mmHg) 87  Pulse Rate 91  ECG Heart Rate 91  Resp 16  MEWS COLOR  MEWS Score Color Green  Oxygen Therapy  SpO2 91 %  MEWS Score  MEWS Temp 0  MEWS Systolic 0  MEWS Pulse 0  MEWS RR 0  MEWS LOC 0  MEWS Score 0   Pt complained of new onset chest pain with a fullness in the lungs as well. Spoke face to face with Dr. Harrie Jeans and no new orders were given. Called a rapid afterwards at 0927. EKG was promptly taken showing a new elevation and RRT provider and nurse are at bedside. Provider upp'd nitro to 80 mcg from 75 since pressure "was holding". Stated he will further discuss with his team.

## 2023-01-19 NOTE — Progress Notes (Signed)
BLE venous duplex has been completed.  Preliminary results given to Raven, RN.   Results can be found under chart review under CV PROC. 01/19/2023 11:56 AM Analaura Messler RVT, RDMS

## 2023-01-19 NOTE — Inpatient Diabetes Management (Addendum)
Inpatient Diabetes Program Recommendations  AACE/ADA: New Consensus Statement on Inpatient Glycemic Control  Target Ranges:  Prepandial:   less than 140 mg/dL      Peak postprandial:   less than 180 mg/dL (1-2 hours)      Critically ill patients:  140 - 180 mg/dL    Latest Reference Range & Units 01/19/23 06:26  Glucose-Capillary 70 - 99 mg/dL 161 (H)    Latest Reference Range & Units 01/18/23 06:41 01/19/23 02:14 01/19/23 06:17  Glucose 70 - 99 mg/dL 096 (H) 045 (H) 409 (H)    Latest Reference Range & Units 09/26/22 00:54 01/18/23 17:55  Hemoglobin A1C 4.8 - 5.6 % 8.0 (H) 11.0 (H)   Review of Glycemic Control  Diabetes history: DM2 Outpatient Diabetes medications: Lantus 26 units QAM, Jardiance 10 mg QAM, Metformin XR 2000 mg QAM Current orders for Inpatient glycemic control: Semglee 12 units BID, Novolog 0-9 units TID with meals, Novolog 0-5 units QHS, Novolog 3 units TID with meals  Inpatient Diabetes Program Recommendations:    Insulin: CBG 303 mg/dl this morning. Patient received Semglee 12 units once since admission (given at 10:15 am today). Semglee 12 units ordered BID and meal coverage also ordered to start with lunch today.   HbgA1C: A1C 11% on 01/18/23 indicating an average glucose of 269 mg/dl over the past 2-3 months.  Outpatient DM: Patient reports he has been out of Jardiance for a few weeks and he needs a refill. Please provide Rx for Jardiance at discharge if it is continued as outpatient at time of discharge.  Addendum 01/19/23@12 :40-Spoke with patient about diabetes and home regimen for diabetes control. Patient reports being followed by PCP for diabetes management and is prescribed Lantus 26 units QAM, Jardiance 10 mg QAM, Metformin XR 2000 mg QAM as an outpatient for diabetes control. Patient states that he has been out of Jardiance for a few weeks and will need Rx for Jardiance at discharge. Patient reports taking DM medications as prescribed. Patient reports that no  changes were made with DM medications at his last few office visit. Patient reports checking glucose once every other day and it is typically in the 200's mg/dl.  Inquired about prior A1C and patient reports last A1C value was in the 8% range. Discussed A1C results (11% on 01/18/23) and explained that current A1C indicates an average glucose of 269 mg/dl over the past 2-3 months. Discussed glucose and A1C goals. Discussed importance of checking CBGs and maintaining good CBG control to prevent long-term and short-term complications. Explained how hyperglycemia leads to damage within blood vessels which lead to the common complications seen with uncontrolled diabetes. Stressed to the patient the importance of improving glycemic control to prevent further complications from uncontrolled diabetes. Discussed impact of nutrition, exercise, stress, sickness, and medications on diabetes control.  Encouraged patient to check glucose at least 2 times a day and to take glucometer to appointments. Also encouraged patient to ask PCP about referral to Endocrinology to assist with DM management.  Patient verbalized understanding of information discussed and reports no further questions at this time related to diabetes.  Thanks, Orlando Penner, RN, MSN, CDCES Diabetes Coordinator Inpatient Diabetes Program [phone] (Team Pager from 8am to 5pm)

## 2023-01-19 NOTE — Progress Notes (Addendum)
[facility] PROGRESS NOTE    Progress Note  Leroy Simmons  ZOX:[id number] DOB: June 11, 1971 DOA: 01/18/2023 PCP: de [place], Raymond J, MD     Brief Narrative:   Leroy Simmons is an 52 y.o. male past medical history significant of chronic diastolic heart failure, essential hypertension HIV diabetes mellitus type 2 with a last A1c of 11, comes in for lower extremity swelling and shortness of breath with ambulation.  He reports that for the last week he has been having sensation of squeezing of his chest.  Cardiology was consulted   Assessment/Plan:   Acute on chronic diastolic heart failure: 1 week of progressive shortness of breath.  Unknown estimated dry weight. Patient is massively fluid overloaded on physical exam. Likely due to noncompliance with his medications. Continue on IV Lasix IV twice daily. Negative about a liter and a half restrict fluid intake. Strict I's and O's and daily weights. Continue to monitor his renal function.  His slightly elevated will defer ACE inhibitor for now If renal function improves he may be a candidate for Entresto. Continue metoprolol and IV Lasix. 2D echo is pending. Try to wean to room air.  Elevated cardiac biomarkers: Likely demand ischemia in the setting of acute decompensated heart failure, cardiac biomarkers have improved. He denies any chest pain. Continue aspirin cardiology to dictate when to stop heparin. 2D echo pending.  Acute kidney injury: Likely due to Diastolic heart failure, also in the differential cardiorenal syndrome. Continue IV diuresis continue to check renal function daily.  Hypervolemic hyponatremia: Improving with IV diuresis continue monitor strict I's and O's recheck basic metabolic panel tomorrow morning.  Uncontrolled diabetes mellitus type 2 with hyperglycemia: Writing scale insulin, hemoglobin A1c is 11. Will start him on long-acting insulin continue sliding scale.  Normocytic anemia: He denies any  bleeding.   DVT prophylaxis: heparin Family Communication:none Status is: Inpatient Remains inpatient appropriate because: Acute decompensated diastolic heart failure    Code Status:     Code Status Orders  (From admission, onward)           Start     Ordered   01/18/23 1946  Full code  Continuous       Question:  By:  Answer:  Consent: discussion documented in EHR   01/18/23 1947           Code Status History     Date Active Date Inactive Code Status Order ID Comments User Context   09/26/2022 2016 09/30/2022 1713 Full Code [id number]  Campbell Lerner, MD Inpatient   09/25/2022 2052 09/26/2022 1815 Full Code [id number]  Campbell Lerner, MD ED   06/02/2022 0903 06/03/2022 2216 Full Code [id number]  Jonah Blue, MD ED   03/17/2022 1453 03/24/2022 2110 Full Code [id number]  Elige Radon, MD ED         IV Access:   Peripheral IV   Procedures and diagnostic studies:   US Abdomen Limited RUQ (LIVER/GB)  Result Date: 01/18/2023 CLINICAL DATA:  [id number] with hepatitis. EXAM: ULTRASOUND ABDOMEN LIMITED RIGHT UPPER QUADRANT COMPARISON:  CT with IV contrast 09/25/2022 FINDINGS: Gallbladder: No gallstones or wall thickening visualized. No sonographic Murphy sign noted by sonographer. Common bile duct: Diameter: 4.0 mm with no intrahepatic biliary prominence. Liver: No focal lesion identified. There is mild generalized increased hepatic echogenicity consistent with the steatosis noted on the prior CT. Portal vein is patent on color Doppler imaging with normal direction of blood flow towards the liver. The portal vein again measures slightly prominent  at 15 mm. This was seen previously. Other: None. IMPRESSION: 1. No gallstones, wall thickening or biliary dilatation. 2. Mild hepatic steatosis. Electronically Signed   By: Almira Bar M.D.   On: 01/18/2023 20:53   DG Chest 2 View  Result Date: 01/18/2023 CLINICAL DATA:  52 year old male with history of chest pain and  dizziness. EXAM: CHEST - 2 VIEW COMPARISON:  Chest x-ray 12/22/2022. FINDINGS: There is cephalization of the pulmonary vasculature and slight indistinctness of the interstitial markings suggestive of mild pulmonary edema. No definite pleural effusions. No pneumothorax. Heart size is mildly enlarged. Upper mediastinal contours are within normal limits. IMPRESSION: 1. The appearance of the chest is most concerning for mild congestive heart failure, as above. Electronically Signed   By: Trudie Reed M.D.   On: 01/18/2023 06:50     Medical Consultants:   None.   Subjective:    Leroy Simmons still short of breath regular fights sleep  Objective:    Vitals:   01/18/23 2130 01/19/23 0010 01/19/23 0200 01/19/23 0434  BP: 125/78 (!) 143/86 130/85 (!) 146/89  Pulse: 87 92 93 91  Resp: Temp:  98.5 F (36.9 C)  98.7 F (37.1 C)  TempSrc:  Oral  Oral  SpO2: 98% 95% 94% 99%  Weight:      Height:       SpO2: 99 % O2 Flow Rate (L/min): 3 L/min   Intake/Output Summary (Last 24 hours) at 01/19/2023 0741 Last data filed at 01/19/2023 0500 Gross per 24 hour  Intake 732.03 ml  Output 2350 ml  Net -1617.97 ml   Filed Weights   01/18/23 0849  Weight: 104.3 kg    Exam: General exam: In no acute distress. Respiratory system: Good air movement and clear to auscultation. Cardiovascular system: S1 & S2 heard, RRR.  Cannot appreciate JVD. Gastrointestinal system: Abdomen is nondistended, soft and nontender.  Extremities: No pedal edema. Skin: No rashes, lesions or ulcers Psychiatry: Judgement and insight appear normal. Mood & affect appropriate.    Data Reviewed:    Labs: Basic Metabolic Panel: Recent Labs  Lab 01/18/23 0641 01/19/23 0214 01/19/23 0617  NA 132* 132* 134*  K 4.3 3.9 4.0  CL 101 100 98  CO2 GLUCOSE 489* 353* 346*  BUN 33* 36* 36*  CREATININE 1.71* 1.86* 1.97*  CALCIUM 8.3* 8.0* 8.2*  MG  --  1.8  --    GFR Estimated Creatinine  Clearance: 53.1 mL/min (A) (by C-G formula based on SCr of 1.97 mg/dL (H)). Liver Function Tests: Recent Labs  Lab 01/18/23 0641 01/19/23 0617  AST 63* 24  ALT 49* 35  ALKPHOS 140* 128*  BILITOT 0.6 0.8  PROT 6.1* 5.7*  ALBUMIN 2.5* 2.4*   No results for input(s): "LIPASE", "AMYLASE" in the last 168 hours. No results for input(s): "AMMONIA" in the last 168 hours. Coagulation profile Recent Labs  Lab 01/19/23 0617  INR 1.1   COVID-19 Labs  No results for input(s): "DDIMER", "FERRITIN", "LDH", "CRP" in the last 72 hours.  No results found for: "SARSCOV2NAA"  CBC: Recent Labs  Lab 01/18/23 0641 01/19/23 0214 01/19/23 0617  WBC 7.8 11.1* 10.3  NEUTROABS 4.4  --   --   HGB 13.6 12.2* 12.3*  HCT 39.3 35.4* 35.6*  MCV 87.1 87.2 86.8  PLT 238 224 209   Cardiac Enzymes: No results for input(s): "CKTOTAL", "CKMB", "CKMBINDEX", "TROPONINI" in the last 168 hours. BNP (last 3 results)  No results for input(s): "PROBNP" in the last 8760 hours. CBG: Recent Labs  Lab 01/19/23 0626  GLUCAP 303*   D-Dimer: No results for input(s): "DDIMER" in the last 72 hours. Hgb A1c: Recent Labs    01/18/23 1755  HGBA1C 11.0*   Lipid Profile: Recent Labs    01/19/23 0617  CHOL 218*  HDL 47  LDLCALC 125*  TRIG 228*  CHOLHDL 4.6   Thyroid function studies: No results for input(s): "TSH", "T4TOTAL", "T3FREE", "THYROIDAB" in the last 72 hours.  Invalid input(s): "FREET3" Anemia work up: No results for input(s): "VITAMINB12", "FOLATE", "FERRITIN", "TIBC", "IRON", "RETICCTPCT" in the last 72 hours. Sepsis Labs: Recent Labs  Lab 01/18/23 0641 01/19/23 0214 01/19/23 0617  WBC 7.8 11.1* 10.3   Microbiology No results found for this or any previous visit (from the past 240 hour(s)).   Medications:    aspirin EC  81 mg Oral Daily   atorvastatin  80 mg Oral Daily   furosemide  40 mg Intravenous BID   hydrALAZINE  25 mg Oral Q8H   insulin aspart  0-15 Units Subcutaneous  TID WC   insulin aspart  0-5 Units Subcutaneous QHS   insulin glargine-yfgn  26 Units Subcutaneous Daily   isosorbide mononitrate  30 mg Oral Daily   metoprolol succinate  50 mg Oral Daily   Continuous Infusions:  heparin 1,700 Units/hr (01/19/23 0339)   nitroGLYCERIN 75 mcg/min (01/19/23 0640)      LOS: 1 day   Marinda Elk  [facility]  01/19/2023, 7:41 AM

## 2023-01-19 NOTE — Progress Notes (Signed)
ANTICOAGULATION CONSULT NOTE - Follow Up Consult  Pharmacy Consult for Heparin Indication: chest pain/ACS  No Known Allergies  Patient Measurements: Height:  (172.7 cm) Weight: 104.3 kg (230 lb) IBW/kg (Calculated) : 68.4 Heparin Dosing Weight: 91.1 kg  Vital Signs: Temp: 98.7 F (37.1 C) (04/18 0919) Temp Source: Oral (04/18 0919) BP: 104/68 (04/18 1020) Pulse Rate: 90 (04/18 1020)  Labs: Recent Labs    01/18/23 0641 01/18/23 0851 01/18/23 1755 01/19/23 0214 01/19/23 0617 01/19/23 1010  HGB 13.6  --   --  12.2* 12.3*  --   HCT 39.3  --   --  35.4* 35.6*  --   PLT 238  --   --  224 209  --   APTT  --   --   --   --  52*  --   LABPROT  --   --   --   --  14.1  --   INR  --   --   --   --  1.1  --   HEPARINUNFRC  --   --    < > 0.13* 0.26* 0.26*  CREATININE 1.71*  --   --  1.86* 1.97*  --   TROPONINIHS 329* 376*  --   --  195*  --    < > = values in this interval not displayed.    Estimated Creatinine Clearance: 53.1 mL/min (A) (by C-G formula based on SCr of 1.97 mg/dL (H)).   Medications:  Scheduled:   amLODipine  5 mg Oral Daily   aspirin EC  81 mg Oral Daily   atorvastatin  80 mg Oral Daily   furosemide  40 mg Intravenous BID   hydrALAZINE  25 mg Oral Q8H   insulin aspart  0-5 Units Subcutaneous QHS   insulin aspart  0-9 Units Subcutaneous TID WC   insulin aspart  3 Units Subcutaneous TID WC   insulin glargine-yfgn  12 Units Subcutaneous BID   isosorbide mononitrate  30 mg Oral Daily   metoprolol succinate  50 mg Oral Daily   Infusions:   heparin 1,700 Units/hr (01/19/23 0339)   nitroGLYCERIN 80 mcg/min (01/19/23 0919)    Assessment: 52 yo male on heparin for chest pain.  Planning to continue heparin for 48 hours per cards recs.  Heparin level 0.26 on 1700 units/hr.  Goal of Therapy:  Heparin level 0.3-0.7 units/ml Monitor platelets by anticoagulation protocol: Yes   Plan:  Increase heparin to 1900 units/hr Heparin level in 6 hours Daily  heparin level and CBC while on heparin Patient will have completed ~48 hours of heparin 4/19 ~ 0800.   [employer], Pharm.D., BCPS Clinical Pharmacist  **Pharmacist phone directory can be found on [email or web address] listed under [facility].  01/19/2023 1:46 PM

## 2023-01-20 ENCOUNTER — Encounter (HOSPITAL_COMMUNITY): Payer: Self-pay | Admitting: Internal Medicine

## 2023-01-20 ENCOUNTER — Other Ambulatory Visit (HOSPITAL_COMMUNITY): Payer: Self-pay

## 2023-01-20 DIAGNOSIS — I10 Essential (primary) hypertension: Secondary | ICD-10-CM | POA: Diagnosis not present

## 2023-01-20 DIAGNOSIS — I214 Non-ST elevation (NSTEMI) myocardial infarction: Secondary | ICD-10-CM | POA: Diagnosis not present

## 2023-01-20 DIAGNOSIS — E1165 Type 2 diabetes mellitus with hyperglycemia: Secondary | ICD-10-CM | POA: Diagnosis not present

## 2023-01-20 DIAGNOSIS — N189 Chronic kidney disease, unspecified: Secondary | ICD-10-CM

## 2023-01-20 DIAGNOSIS — N179 Acute kidney failure, unspecified: Secondary | ICD-10-CM | POA: Diagnosis not present

## 2023-01-20 DIAGNOSIS — I5033 Acute on chronic diastolic (congestive) heart failure: Secondary | ICD-10-CM | POA: Diagnosis not present

## 2023-01-20 DIAGNOSIS — I509 Heart failure, unspecified: Secondary | ICD-10-CM | POA: Diagnosis not present

## 2023-01-20 DIAGNOSIS — I82452 Acute embolism and thrombosis of left peroneal vein: Secondary | ICD-10-CM | POA: Insufficient documentation

## 2023-01-20 DIAGNOSIS — I82451 Acute embolism and thrombosis of right peroneal vein: Secondary | ICD-10-CM | POA: Diagnosis not present

## 2023-01-20 DIAGNOSIS — E669 Obesity, unspecified: Secondary | ICD-10-CM

## 2023-01-20 LAB — BASIC METABOLIC PANEL
Anion gap: 13 (ref 5–15)
BUN: 42 mg/dL — ABNORMAL HIGH (ref 6–20)
CO2: 24 mmol/L (ref 22–32)
Calcium: 8.1 mg/dL — ABNORMAL LOW (ref 8.9–10.3)
Chloride: 98 mmol/L (ref 98–111)
Creatinine, Ser: 2.12 mg/dL — ABNORMAL HIGH (ref 0.61–1.24)
GFR, Estimated: 37 mL/min — ABNORMAL LOW (ref >60)
Glucose, Bld: 213 mg/dL — ABNORMAL HIGH (ref 70–99)
Potassium: 3.5 mmol/L (ref 3.5–5.1)
Sodium: 135 mmol/L (ref 135–145)

## 2023-01-20 LAB — GLUCOSE, CAPILLARY
Glucose-Capillary: 238 mg/dL — ABNORMAL HIGH (ref 70–99)
Glucose-Capillary: 299 mg/dL — ABNORMAL HIGH (ref 70–99)
Glucose-Capillary: 209 mg/dL — ABNORMAL HIGH (ref 70–99)
Glucose-Capillary: 272 mg/dL — ABNORMAL HIGH (ref 70–99)

## 2023-01-20 LAB — CBC
HCT: 32.8 % — ABNORMAL LOW (ref 39.0–52.0)
Hemoglobin: 11.3 g/dL — ABNORMAL LOW (ref 13.0–17.0)
MCH: 29.9 pg (ref 26.0–34.0)
MCHC: 34.5 g/dL (ref 30.0–36.0)
MCV: 86.8 fL (ref 80.0–100.0)
Platelets: 176 10*3/uL (ref 150–400)
RBC: 3.78 MIL/uL — ABNORMAL LOW (ref 4.22–5.81)
RDW: 12.3 % (ref 11.5–15.5)
WBC: 9.5 10*3/uL (ref 4.0–10.5)
nRBC: 0 % (ref 0.0–0.2)

## 2023-01-20 LAB — HEPARIN LEVEL (UNFRACTIONATED)
Heparin Unfractionated: 0.25 IU/mL — ABNORMAL LOW (ref 0.30–0.70)
Heparin Unfractionated: 0.23 IU/mL — ABNORMAL LOW (ref 0.30–0.70)

## 2023-01-20 MED ORDER — MAGNESIUM SULFATE 2 GM/50ML IV SOLN
2.0000 g | Freq: Once | INTRAVENOUS | Status: AC
  Administered 2023-01-20: 2 g via INTRAVENOUS
  Filled 2023-01-20: qty 50

## 2023-01-20 MED ORDER — BISACODYL 5 MG PO TBEC: 5.0000 mg | DELAYED_RELEASE_TABLET | Freq: Every day | ORAL | Status: DC | PRN

## 2023-01-20 MED ORDER — HEPARIN BOLUS VIA INFUSION
1300.0000 [IU] | Freq: Once | INTRAVENOUS | Status: AC
  Administered 2023-01-20: 1300 [IU] via INTRAVENOUS
  Filled 2023-01-20: qty 1300

## 2023-01-20 MED ORDER — INSULIN ASPART 100 UNIT/ML IJ SOLN
5.0000 [IU] | Freq: Three times a day (TID) | INTRAMUSCULAR | Status: DC
  Administered 2023-01-20 – 2023-01-24 (×12): 5 [IU] via SUBCUTANEOUS

## 2023-01-20 MED ORDER — INSULIN ASPART 100 UNIT/ML IJ SOLN: 3.0000 [IU] | Freq: Three times a day (TID) | INTRAMUSCULAR | Status: DC

## 2023-01-20 MED ORDER — FUROSEMIDE 10 MG/ML IJ SOLN
80.0000 mg | Freq: Two times a day (BID) | INTRAMUSCULAR | Status: DC
  Administered 2023-01-20 – 2023-01-23 (×8): 80 mg via INTRAVENOUS
  Filled 2023-01-20 (×8): qty 8

## 2023-01-20 MED ORDER — HYDRALAZINE HCL 50 MG PO TABS
50.0000 mg | ORAL_TABLET | Freq: Three times a day (TID) | ORAL | Status: DC
  Administered 2023-01-20 – 2023-01-24 (×13): 50 mg via ORAL
  Filled 2023-01-20 (×13): qty 1

## 2023-01-20 NOTE — TOC Benefit Eligibility Note (Signed)
Patient Product/process development scientist completed.    The patient is currently admitted and upon discharge could be taking Xarelto Starter Pack.  The current 30 day co-pay is $923.96 due to a $4,495 deductible.   The patient is currently admitted and upon discharge could be taking Eliquis Starter Pack.  The current 30 day co-pay is $699.59 due to a $4,495 deductible.   The patient is insured through [employer]   This test claim was processed through [employer]- copay amounts may vary at other pharmacies due to [employer], or as the patient moves through the different stages of their insurance plan.  Leroy Simmons, CPHT Pharmacy Patient Advocate Specialist [facility] Patient Advocate Team Direct Number: [phone]  Fax: [phone]

## 2023-01-20 NOTE — Assessment & Plan Note (Signed)
Calculated BMI is 36,1 consistent with obesity class 2.

## 2023-01-20 NOTE — Assessment & Plan Note (Deleted)
Continue blood pressure control with hydralazine and isosorbide.  On amlodipine 5 mg daily.  Diuresis with furosemide.

## 2023-01-20 NOTE — Assessment & Plan Note (Addendum)
-  Baseline serum cr 1,5 to 2,0 (old records personally reviewed). -CKD stage 3b, hyponatremia, hypokalemia.   Renal function today with serum cr at 1,90 with K at 3,9 and serum bicarbonate at 26. Na is 134 and Mg at 2,0  Plan to continue diuresis at home with torsemide. Follow up renal function as outpatient in 7 days. Continue with fluid restriction at home, 1,500 ml per day.

## 2023-01-20 NOTE — Progress Notes (Signed)
s  Heart Failure Stewardship Pharmacist Progress Note   PCP: de [place], Buren Kos, MD PCP-Cardiologist: Parke Poisson, MD    HPI:  52 yo M with PMH of HTN, HLD, CHF, CAD, anxiety, T2DM, pulmonary nodules, and asthma.  Presented to the ED on 4/17 with shortness of breath, LE edema, orthopnea, and chest pressure. Reports that his lasix wasn't working as well any more to remove fluid. CXR concerning for mild CHF. ECHO 4/18 with LVEF 60-65% (was 55-60% in 03/2022 and 09/2022), no regional wall motion abnormalities, RV normal. Also with new acute DVT. May need a cath before discharge.   Current HF Medications: Diuretic: furosemide 80 mg IV BID Beta Blocker: metoprolol XL 50 mg daily Other: hydralazine 50 mg TID + Imdur 30 mg daily  Prior to admission HF Medications: Diuretic: furosemide 40 mg daily Beta blocker: metoprolol XL 50 mg daily - no recent fill history for this ACE/ARB/ARNI: lisinopril 20 mg daily SGLT2i: Jardiance 10 mg daily - he states he receives this for free through the health dept / [facility]   Pertinent Lab Values: Serum creatinine 2.12, BUN 42, Potassium 3.5, Sodium 135, BNP 141.8, Magnesium 1.8, A1c 11  Vital Signs: Weight: 237 Blood pressure: 120-140/90s Heart rate: 80-90s  I/O: +0.7L yesterday; net -1.3L  Medication Assistance / Insurance Benefits Check: Does the patient have prescription insurance?  Yes - insurance started on 4/1 Type of insurance plan: Hospital doctor  Outpatient Pharmacy:  Prior to admission outpatient pharmacy: [facility] Is the patient willing to use [facility] TOC pharmacy at discharge? Yes Is the patient willing to transition their outpatient pharmacy to utilize a [facility] outpatient pharmacy?   Yes - transitioned to [facility] OP    Assessment: 1. Acute on chronic diastolic CHF (LVEF 60-65%). NYHA class III symptoms. - Continue furosemide 80 mg IV BID. Strict I/Os and daily weights. Keep K>4 and Mg>2. - Continue metoprolol XL 50 mg  daily - Continue hydralazine 50 mg TID + Imdur 30 mg daily - Holding ARB/ARNI with AKI - Holding Jardiance with elevated A1c  Plan: 1) Medication changes recommended at this time: - Continue IV diuresis   2) Patient assistance: - $4,495 deductible remaining on insurance - Entresto copay $656.56 until deductible met - copay card will lower this to $10 per month now - Jardiance requires prior authorization, can complete if this is restarted - Previously received [place] through health dept / [place]. He is unsure if he still qualifies for this now that he has insurance - Marcelline Deist not available on formulary  3)  Education  - Patient has been educated on current HF medications and potential additions to HF medication regimen - Patient verbalizes understanding that over the next few months, these medication doses may change and more medications may be added to optimize HF regimen - Patient has been educated on basic disease state pathophysiology and goals of therapy   Sharen Hones, PharmD, BCPS Heart Failure Engineer, building services Phone [phone]

## 2023-01-20 NOTE — Assessment & Plan Note (Addendum)
Follow up limited echocardiogram with preserved LV systolic function with EF 60 to 65%, RV with preserved systolic function, RVSP 49.3 , no atrial dilatation, no pericardial effusion. No significant valvular disease.   Troponin elevation due to demand ischemia (NSTEMI rule out).   Continue with volume overload. Urine output is 3,050 ml  Systolic blood pressure is 131 to 142 mmHg.  Plan to continue diuresis with furosemide 80 mg q12 hrs.    Continue afterload reduction with hydralazine and isosorbide.  B blockade with metoprolol succinate 50 mg daily. Limited pharmacologic options due to reduced GFR.  Add unna boots

## 2023-01-20 NOTE — Progress Notes (Signed)
Patient noted to have a 22 beat run of V tach. Patient denied chest pain at that time. Vital signs stable and Meng, PA notified.

## 2023-01-20 NOTE — Hospital Course (Signed)
[name] was admitted to the hospital with the working diagnosis of heart failure exacerbation.   53 yo male with the past medical history of heart failure, coronary artery disease. T2DM and asthma who presented with worsening lower extremity edema. Worsening symptoms for one week, with chest tightness, progressive edema, orthopnea and PND. On his initial physical examination his blood pressure was 148/95, HR 84, RR 17 and 02 saturation 99%, lungs with no wheezing or rales, heart with S1 and S2 present and rhythmic, abdomen with no distention and positive lower extremity edema.   Na 137, K 4,3 Cl 101, bicarbonate 23, glucose 489, bun 33 cr 1,71  AST 63, ALT 49  BNP 141  High sensitive troponin 329 and 376  Wbc 7,8 hgb 13.6 plt 238   Chest radiograph with cardiomegaly with bilateral hilar vascular congestion with bilateral interstitial infiltrates, cephalization of the vasculature.  EKG 96 bpm, normal axis, normal intervals, sinus rhythm with no significant ST segment changes, negative T wave lead I and AvL. (Not new changes).   Patient was placed on IV furosemide for diuresis and heparin drip for anticoagulation.   Limited echocardiogram with no wall motion abnormalities and preserved LV systolic function.   04/20 continue volume overloaded.   04/21: Patient demonstrated improvement in his breathing, but is still with signs of fluid overload on examination.  Following cardiology recommendations we will continue IV diuresis.  Continue close monitoring of patient renal function and electrolytes.  No final decision made regarding catheterization, continue heparin drip. 04/22 patient continue to respond to diuresis, changed from heparin to enoxaparin for anticoagulation.   04/23 patient with improvement in volume status, plan to continue diuresis with oral torsemide and follow up as outpatient.

## 2023-01-20 NOTE — Progress Notes (Addendum)
Progress Note   Patient: Leroy Simmons:[id number] DOB: 1971/09/28 DOA: 01/18/2023     2 DOS: the patient was seen and examined on 01/20/2023   Brief hospital course: Leroy Simmons was admitted to the hospital with the working diagnosis of heart failure exacerbation.   52 yo male with the past medical history of heart failure, coronary artery disease. T2DM and asthma who presented with worsening lower extremity edema. Worsening symptoms for one week, with chest tightness, progressive edema, orthopnea and PND. On his initial physical examination his blood pressure was 148/95, HR 84, RR 17 and 02 saturation 99%, lungs with no wheezing or rales, heart with S1 and S2 present and rhythmic, abdomen with no distention and positive lower extremity edema.   Na 137, K 4,3 Cl 101, bicarbonate 23, glucose 489, bun 33 cr 1,71  AST 63, ALT 49  BNP 141  High sensitive troponin 329 and 376  Wbc 7,8 hgb 13.6 plt 238   Chest radiograph with cardiomegaly with bilateral hilar vascular congestion with bilateral interstitial infiltrates, cephalization of the vasculature.  EKG 96 bpm, normal axis, normal intervals, sinus rhythm with no significant ST segment changes, negative T wave lead I and AvL. (Not new changes).   Patient was placed on IV furosemide for diuresis and heparin drip for anticoagulation.   Limited echocardiogram with no wall motion abnormalities and preserved LV systolic function.       Assessment and Plan: * Acute on chronic diastolic CHF (congestive heart failure) Follow up limited echocardiogram with preserved LV systolic function with EF 60 to 65%, RV with preserved systolic function, RVSP 49.3 , no atrial dilatation, no pericardial effusion. No significant valvular disease.   Troponin elevation due to demand ischemia (NSTEMI rule out).   Continue with volume overload. Urine output is 1,100 ml  Systolic blood pressure is 123 to 140 mmHg.  Plan to continue diuresis with furosemide  80 mg q12 hrs.  May need further diuresis with metolazone.  Continue afterload reduction with hydralazine and isosorbide.  B blockade with metoprolol succinate 50 mg daily. Limited pharmacologic options due to reduced GFR.   Essential hypertension Continue blood pressure control with hydralazine and isosorbide.  On amlodipine 5 mg daily.  Diuresis with furosemide.   Acute kidney injury superimposed on CKD Baseline serum cr 1,5 to 2,0 (old records personally reviewed). CKD stage 3b, hyponatremia.  Renal function with serum cr at 2,12 with K at 3,5 and serum bicarbonate at 24. Na 135 and Mg 1,8  Plan to continue diuresis with IV furosemide Add 2 g mag sulfate to prevent hypomagnesemia. Follow up renal function and electrolytes in am.   Type 2 diabetes mellitus with hyperlipidemia Uncontrolled hyperglycemia.   Continue insulin therapy with sliding scale and basal insulin.  His fasting glucose this am was 213 Add pre meal insulin 5 units.   Continue with statin therapy.   Acute deep vein thrombosis (DVT) of left peroneal vein Left acute deep vein thrombosis involving the left peroneal veins. (Not right) Continue anticoagulation with heparin drip, transition to oral anticoagulation if no catheterization planned.   Obesity (BMI 30-39.9) Calculated BMI is 36,1 consistent with obesity class 2.         Subjective: patient continue to have edema and dyspnea, improved but not back to baseline, tolerating po well.   Physical Exam: Vitals:   01/20/23 1200 01/20/23 1400 01/20/23 1436 01/20/23 1501  BP: (!) 147/77 123/77 123/77 (!) 140/84  Pulse:    91  Resp:    Marland Kitchen)  24  Temp:    98.1 F (36.7 C)  TempSrc:    Oral  SpO2:    97%  Weight:      Height:       Neurology awake and alert ENT with mild pallor Cardiovascular with S1 and S2 present and rhythmic with no gallops, rubs or murmurs\ No JVD Positive lower extremity edema +++ Respiratory with bilateral rales, with no  wheezing or rhonchi Abdomen with no distention  Data Reviewed:    Family Communication: no family at the bedside   Disposition: Status is: Inpatient Remains inpatient appropriate because: heart failure   Planned Discharge Destination: Home    Author: Coralie Keens, MD 01/20/2023 3:36 PM  For on call review [email or web address].

## 2023-01-20 NOTE — Progress Notes (Signed)
ANTICOAGULATION CONSULT NOTE - Follow Up Consult  Pharmacy Consult for Heparin Indication: chest pain/ACS; new LLE DVT  No Known Allergies  Patient Measurements: Height:  (172.7 cm) Weight: 107.7 kg (237 lb 8 oz) IBW/kg (Calculated) : 68.4 Heparin Dosing Weight: 91.1 kg  Vital Signs: Temp: 98.1 F (36.7 C) (04/19 1501) Temp Source: Oral (04/19 1501) BP: 140/84 (04/19 1501) Pulse Rate: 91 (04/19 1501)  Labs: Recent Labs    01/18/23 0641 01/18/23 0851 01/18/23 1755 01/19/23 0214 01/19/23 0617 01/19/23 1010 01/19/23 2009 01/20/23 0057 01/20/23 1811  HGB 13.6  --   --  12.2* 12.3*  --   --  11.3*  --   HCT 39.3  --   --  35.4* 35.6*  --   --  32.8*  --   PLT 238  --   --  224 209  --   --  176  --   APTT  --   --   --   --  52*  --   --   --   --   LABPROT  --   --   --   --  14.1  --   --   --   --   INR  --   --   --   --  1.1  --   --   --   --   HEPARINUNFRC  --   --    < > 0.13* 0.26*   < > 0.26* 0.25* 0.23*  CREATININE 1.71*  --   --  1.86* 1.97*  --   --  2.12*  --   TROPONINIHS 329* 376*  --   --  195*  --   --   --   --    < > = values in this interval not displayed.     Estimated Creatinine Clearance: 50.1 mL/min (A) (by C-G formula based on SCr of 2.12 mg/dL (H)).   Medications:  Scheduled:   amLODipine  5 mg Oral Daily   aspirin EC  81 mg Oral Daily   atorvastatin  80 mg Oral Daily   furosemide  80 mg Intravenous BID   hydrALAZINE  50 mg Oral Q8H   insulin aspart  0-5 Units Subcutaneous QHS   insulin aspart  0-9 Units Subcutaneous TID WC   insulin aspart  5 Units Subcutaneous TID WC   insulin glargine-yfgn  15 Units Subcutaneous BID   isosorbide mononitrate  30 mg Oral Daily   metoprolol succinate  50 mg Oral Daily   Infusions:   heparin 2,400 Units/hr (01/20/23 1606)    Assessment: 52 yo male originally on heparin for chest pain.  Now with new LLE DVT.  Heparin remains below goal at level 0.23 on 2400 units/hr.    Goal of Therapy:   Heparin level 0.3-0.7 units/ml Monitor platelets by anticoagulation protocol: Yes   Plan:  Bolus heparin 1300 units IV x1 then Increase heparin infusion to 2600 units/hr Heparin level in 6 hours Daily heparin level and CBC while on heparin Follow-up plans for oral anticoagulation pending need for cardiac cath.   Calton Dach, PharmD Clinical Pharmacist 01/20/2023 7:07 PM

## 2023-01-20 NOTE — Progress Notes (Signed)
ANTICOAGULATION CONSULT NOTE - Follow Up Consult  Pharmacy Consult for Heparin Indication: chest pain/ACS; new LLE DVT  No Known Allergies  Patient Measurements: Height:  (172.7 cm) Weight: 107.7 kg (237 lb 8 oz) IBW/kg (Calculated) : 68.4 Heparin Dosing Weight: 91.1 kg  Vital Signs: Temp: 98.1 F (36.7 C) (04/19 0330) Temp Source: Oral (04/19 0330) BP: 127/60 (04/19 0600) Pulse Rate: 91 (04/19 0600)  Labs: Recent Labs    01/18/23 0641 01/18/23 0851 01/18/23 1755 01/19/23 0214 01/19/23 0617 01/19/23 1010 01/19/23 2009 01/20/23 0057  HGB 13.6  --   --  12.2* 12.3*  --   --  11.3*  HCT 39.3  --   --  35.4* 35.6*  --   --  32.8*  PLT 238  --   --  224 209  --   --  176  APTT  --   --   --   --  52*  --   --   --   LABPROT  --   --   --   --  14.1  --   --   --   INR  --   --   --   --  1.1  --   --   --   HEPARINUNFRC  --   --    < > 0.13* 0.26* 0.26* 0.26* 0.25*  CREATININE 1.71*  --   --  1.86* 1.97*  --   --  2.12*  TROPONINIHS 329* 376*  --   --  195*  --   --   --    < > = values in this interval not displayed.     Estimated Creatinine Clearance: 50.1 mL/min (A) (by C-G formula based on SCr of 2.12 mg/dL (H)).   Medications:  Scheduled:   amLODipine  5 mg Oral Daily   aspirin EC  81 mg Oral Daily   atorvastatin  80 mg Oral Daily   furosemide  80 mg Intravenous BID   hydrALAZINE  50 mg Oral Q8H   insulin aspart  0-5 Units Subcutaneous QHS   insulin aspart  0-9 Units Subcutaneous TID WC   insulin aspart  3 Units Subcutaneous TID WC   insulin glargine-yfgn  15 Units Subcutaneous BID   isosorbide mononitrate  30 mg Oral Daily   metoprolol succinate  50 mg Oral Daily   Infusions:   heparin 2,100 Units/hr (01/20/23 0321)   nitroGLYCERIN 75 mcg/min (01/20/23 0851)    Assessment: 52 yo male originally on heparin for chest pain.  Now with new LLE DVT.  Heparin remains below goal at level 0.25 on 2100 units/hr.  No IV issues or bleeding noted.  Goal  of Therapy:  Heparin level 0.3-0.7 units/ml Monitor platelets by anticoagulation protocol: Yes   Plan:  Increase heparin to 2400 units/hr Heparin level in 6 hours Daily heparin level and CBC while on heparin Follow-up plans for oral anticoagulation pending need for cardiac cath.   [employer], Pharm.D., BCPS Clinical Pharmacist  **Pharmacist phone directory can be found on [email or web address] listed under [facility].  01/20/2023 10:20 AM

## 2023-01-20 NOTE — TOC Progression Note (Signed)
Transition of Care [facility]) - Progression Note    Patient Details  Name: Leroy Simmons MRN: [id number] Date of Birth: [birth year]  Transition [facility]) CM/SW Contact  Leone Haven, RN Phone Number: 01/20/2023, 5:18 PM  Clinical Narrative:     from home with girlfriend, NSTEMi, Hep and nitro drip, iv lasix,  TOC following.       Expected Discharge Plan and Services                                               Social Determinants of Health (SDOH) Interventions SDOH Screenings   Food Insecurity: No Food Insecurity (01/19/2023)  Housing: Low Risk  (01/20/2023)  Transportation Needs: No Transportation Needs (01/20/2023)  Utilities: Not At Risk (01/19/2023)  Alcohol Screen: Low Risk  (01/20/2023)  Depression (PHQ2-9): Low Risk  (05/24/2022)  Recent Concern: Depression (PHQ2-9) - High Risk (03/10/2022)  Financial Resource Strain: Low Risk  (01/20/2023)  Tobacco Use: Low Risk  (01/20/2023)    Readmission Risk Interventions    09/27/2022   12:36 PM  Readmission Risk Prevention Plan  Transportation Screening Complete  PCP or Specialist Appt within 3-5 Days Complete  Palliative Care Screening Not Applicable  Medication Review (RN Care Manager) Complete

## 2023-01-20 NOTE — Progress Notes (Signed)
Rounding Note    Patient Name: Leroy Simmons Date of Encounter: 01/20/2023  [facility] Cardiologist: Parke Poisson, MD   Subjective   Feeling short of breath.  Reports chest discomfort that he attributes to his breathing.   Inpatient Medications    Scheduled Meds:  amLODipine  5 mg Oral Daily   aspirin EC  81 mg Oral Daily   atorvastatin  80 mg Oral Daily   furosemide  80 mg Intravenous BID   hydrALAZINE  25 mg Oral Q8H   insulin aspart  0-5 Units Subcutaneous QHS   insulin aspart  0-9 Units Subcutaneous TID WC   insulin aspart  3 Units Subcutaneous TID WC   insulin glargine-yfgn  15 Units Subcutaneous BID   isosorbide mononitrate  30 mg Oral Daily   metoprolol succinate  50 mg Oral Daily   Continuous Infusions:  heparin 2,100 Units/hr (01/20/23 0321)   nitroGLYCERIN 75 mcg/min (01/20/23 0851)   PRN Meds: acetaminophen **OR** acetaminophen, albuterol, mouth rinse   Vital Signs    Vitals:   01/20/23 0330 01/20/23 0400 01/20/23 0500 01/20/23 0600  BP:  119/61 135/85 127/60  Pulse:  89 90 91  Resp: Temp: 98.1 F (36.7 C)     TempSrc: Oral     SpO2:  95% 99% 96%  Weight:  107.7 kg    Height:        Intake/Output Summary (Last 24 hours) at 01/20/2023 0900 Last data filed at 01/20/2023 0601 Gross per 24 hour  Intake 1221.54 ml  Output 1100 ml  Net 121.54 ml      01/20/2023    4:00 AM 01/18/2023    8:49 AM 01/13/2023    2:48 PM  Last 3 Weights  Weight (lbs) 237 lb 8 oz 230 lb 235 lb 3.2 oz  Weight (kg) 107.729 kg 104.327 kg 106.686 kg      Telemetry    Sinus rhythm - Personally Reviewed  ECG    Sinus rhythm.  Rate 92 bpm.  Inferolateral TWI - Personally Reviewed  Physical Exam   VS:  BP 127/60   Pulse 91   Temp 98.1 F (36.7 C) (Oral)   Resp 14   Ht  (1.727 m)   Wt 107.7 kg   SpO2 96%   BMI 36.11 kg/m  , BMI Body mass index is 36.11 kg/m. GENERAL:  Chronically ill-appearing HEENT: Pupils equal round and  reactive, fundi not visualized, oral mucosa unremarkable NECK:  + jugular venous distention, waveform within normal limits, carotid upstroke brisk and symmetric, no bruits, no thyromegaly LUNGS: Diminished breath sounds.  No crackles.  HEART:  RRR.  PMI not displaced or sustained,S1 and S2 within normal limits, no S3, no S4, no clicks, no rubs, no murmurs ABD:  Flat, positive bowel sounds normal in frequency in pitch, no bruits, no rebound, no guarding, no midline pulsatile mass, no hepatomegaly, no splenomegaly EXT:  Anasarca.  No cyanosis no clubbing SKIN:  No rashes no nodules NEURO:  Cranial nerves II through XII grossly intact, motor grossly intact throughout PSYCH:  Cognitively intact, oriented to person place and time  Labs    High Sensitivity Troponin:   Recent Labs  Lab 01/18/23 0641 01/18/23 0851 01/19/23 0617  TROPONINIHS 329* 376* 195*     Chemistry Recent Labs  Lab 01/18/23 0641 01/19/23 0214 01/19/23 0617 01/20/23 0057  NA 132* 132* 134* 135  K 4.3 3.9 4.0 3.5  CL 101  100 98 98  CO2 GLUCOSE 489* 353* 346* 213*  BUN 33* 36* 36* 42*  CREATININE 1.71* 1.86* 1.97* 2.12*  CALCIUM 8.3* 8.0* 8.2* 8.1*  MG  --  1.8  --   --   PROT 6.1*  --  5.7*  --   ALBUMIN 2.5*  --  2.4*  --   AST 63*  --  24  --   ALT 49*  --  35  --   ALKPHOS 140*  --  128*  --   BILITOT 0.6  --  0.8  --   GFRNONAA 48* 44* 41* 37*  ANIONGAP Lipids  Recent Labs  Lab 01/19/23 0617  CHOL 218*  TRIG 228*  HDL 47  LDLCALC 125*  CHOLHDL 4.6    Hematology Recent Labs  Lab 01/19/23 0214 01/19/23 0617 01/20/23 0057  WBC 11.1* 10.3 9.5  RBC 4.06* 4.10* 3.78*  HGB 12.2* 12.3* 11.3*  HCT 35.4* 35.6* 32.8*  MCV 87.2 86.8 86.8  MCH 30.0 30.0 29.9  MCHC 34.5 34.6 34.5  RDW 12.5 12.5 12.3  PLT 224 209 176   Thyroid No results for input(s): "TSH", "FREET4" in the last 168 hours.  BNP Recent Labs  Lab 01/18/23 0641  BNP 141.8*    DDimer No results for  input(s): "DDIMER" in the last 168 hours.   Radiology    ECHOCARDIOGRAM LIMITED  Result Date: 01/19/2023    ECHOCARDIOGRAM LIMITED REPORT   Patient Name:   Leroy Simmons Date of Exam: 01/19/2023 Medical Rec #:  [id number]      Height:       68.0 in Accession #:    [id number]     Weight:       230.0 lb Date of Birth:  Nov 29, 1970      BSA:          2.169 m Patient Age:    51 years       BP:           117/65 mmHg Patient Gender: M              HR:           89 bpm. Exam Location:  Inpatient Procedure: 2D Echo, Cardiac Doppler and Color Doppler Indications:    Abnormal ECG  History:        Patient has prior history of Echocardiogram examinations, most                 recent 03/17/2022. CHF, CAD; Risk Factors:Hypertension and                 Diabetes.  Sonographer:    Wallie Char Referring Phys: [id number] [facility] GOEL IMPRESSIONS  1. Limited echo  2. Left ventricular ejection fraction, by estimation, is 60 to 65%. Left ventricular ejection fraction by PLAX is 62 %. The left ventricle has normal function. The left ventricle has no regional wall motion abnormalities.  3. Right ventricular systolic function is normal. The right ventricular size is normal. There is moderately elevated pulmonary artery systolic pressure. The estimated right ventricular systolic pressure is 49.3 mmHg.  4. The aortic valve is tricuspid. Aortic valve regurgitation is not visualized.  5. The inferior vena cava is dilated in size with <50% respiratory variability, suggesting right atrial pressure of 15 mmHg. Comparison(s): Changes from prior study are noted. 03/17/2022: LVEF 55-60%. FINDINGS  Left Ventricle: Left ventricular ejection fraction, by  estimation, is 60 to 65%. Left ventricular ejection fraction by PLAX is 62 %. The left ventricle has normal function. The left ventricle has no regional wall motion abnormalities. The left ventricular internal cavity size was normal in size. There is no left ventricular hypertrophy. Right Ventricle: The  right ventricular size is normal. No increase in right ventricular wall thickness. Right ventricular systolic function is normal. There is moderately elevated pulmonary artery systolic pressure. The tricuspid regurgitant velocity is 2.93 m/s, and with an assumed right atrial pressure of 15 mmHg, the estimated right ventricular systolic pressure is 49.3 mmHg. Left Atrium: Left atrial size was normal in size. Right Atrium: Right atrial size was normal in size. Pericardium: There is no evidence of pericardial effusion. Mitral Valve: MV peak gradient, 7.4 mmHg. The mean mitral valve gradient is 3.0 mmHg. Tricuspid Valve: The tricuspid valve is grossly normal. Tricuspid valve regurgitation is trivial. Aortic Valve: The aortic valve is tricuspid. Aortic valve regurgitation is not visualized. Aortic valve mean gradient measures 3.0 mmHg. Aortic valve peak gradient measures 5.4 mmHg. Aortic valve area, by VTI measures 3.45 cm. Aorta: The aortic root and ascending aorta are structurally normal, with no evidence of dilitation. Venous: The inferior vena cava is dilated in size with less than 50% respiratory variability, suggesting right atrial pressure of 15 mmHg. IAS/Shunts: No atrial level shunt detected by color flow Doppler. LEFT VENTRICLE PLAX 2D LV EF:         Left ventricular ejection fraction by PLAX is 62 %. LVIDd:         4.80 cm LVIDs:         3.20 cm LV PW:         1.00 cm LV IVS:        1.00 cm LVOT diam:     1.90 cm LV SV:         62 LV SV Index:   28 LVOT Area:     2.84 cm  LV Volumes (MOD) LV vol d, MOD A2C: 69.3 ml LV vol d, MOD A4C: 87.1 ml LV vol s, MOD A2C: 29.4 ml LV vol s, MOD A4C: 36.0 ml LV SV MOD A2C:     39.9 ml LV SV MOD A4C:     87.1 ml LV SV MOD BP:      44.9 ml RIGHT VENTRICLE         IVC TAPSE (M-mode): 2.1 cm  IVC diam: 2.40 cm LEFT ATRIUM             Index        RIGHT ATRIUM           Index LA diam:        4.60 cm 2.12 cm/m   RA Area:     12.30 cm LA Vol (A2C):   47.5 ml 21.90 ml/m  RA  Volume:   22.80 ml  10.51 ml/m LA Vol (A4C):   62.8 ml 28.96 ml/m LA Biplane Vol: 54.3 ml 25.04 ml/m  AORTIC VALVE AV Area (Vmax):    3.13 cm AV Area (Vmean):   3.73 cm AV Area (VTI):     3.45 cm AV Vmax:           116.00 cm/s AV Vmean:          75.500 cm/s AV VTI:            0.179 m AV Peak Grad:      5.4 mmHg AV Mean Grad:  3.0 mmHg LVOT Vmax:         128.00 cm/s LVOT Vmean:        99.200 cm/s LVOT VTI:          0.218 m LVOT/AV VTI ratio: 1.22  AORTA Ao Root diam: 3.50 cm Ao Asc diam:  3.50 cm MITRAL VALVE                TRICUSPID VALVE MV Area (PHT): 4.60 cm     TR Peak grad:   34.3 mmHg MV Area VTI:   1.89 cm     TR Vmax:        293.00 cm/s MV Peak grad:  7.4 mmHg MV Mean grad:  3.0 mmHg     SHUNTS MV Vmax:       1.36 m/s     Systemic VTI:  0.22 m MV Vmean:      81.9 cm/s    Systemic Diam: 1.90 cm MV Decel Time: 165 msec MV E velocity: 135.00 cm/s MV A velocity: 52.50 cm/s MV E/A ratio:  2.57 Zoila Shutter MD Electronically signed by Zoila Shutter MD Signature Date/Time: 01/19/2023/5:09:50 PM    Final    VAS [place] LOWER EXTREMITY VENOUS (DVT)  Result Date: 01/19/2023  Lower Venous DVT Study Patient Name:  Leroy Simmons  Date of Exam:   01/19/2023 Medical Rec #: [id number]       Accession #:    [id number] Date of Birth: 1971/02/26       Patient Gender: M Patient Age:   54 years Exam Location:  [facility] Procedure:      VAS [place] LOWER EXTREMITY VENOUS (DVT) Referring Phys: [facility] GOEL --------------------------------------------------------------------------------  Indications: Swelling.  Limitations: Poor ultrasound/tissue interface. Comparison Study: Previous exam on 07/29/22 was negative for DVT. Performing Technologist: Ernestene Mention RVT, RDMS  Examination Guidelines: A complete evaluation includes B-mode imaging, spectral Doppler, color Doppler, and power Doppler as needed of all accessible portions of each vessel. Bilateral testing is considered an integral part of a complete examination.  Limited examinations for reoccurring indications may be performed as noted. The reflux portion of the exam is performed with the patient in reverse Trendelenburg.  +---------+---------------+---------+-----------+----------+--------------+ RIGHT    CompressibilityPhasicitySpontaneityPropertiesThrombus Aging +---------+---------------+---------+-----------+----------+--------------+ CFV      Full           Yes      Yes                                 +---------+---------------+---------+-----------+----------+--------------+ SFJ      Full                                                        +---------+---------------+---------+-----------+----------+--------------+ FV Prox  Full           Yes      Yes                                 +---------+---------------+---------+-----------+----------+--------------+ FV Mid   Full           Yes      Yes                                 +---------+---------------+---------+-----------+----------+--------------+  FV DistalFull           Yes      Yes                                 +---------+---------------+---------+-----------+----------+--------------+ PFV      Full                                                        +---------+---------------+---------+-----------+----------+--------------+ POP      Full           Yes      Yes                                 +---------+---------------+---------+-----------+----------+--------------+ PTV      Full                                                        +---------+---------------+---------+-----------+----------+--------------+ PERO     Full                                                        +---------+---------------+---------+-----------+----------+--------------+   +---------+---------------+---------+-----------+----------+--------------+ LEFT     CompressibilityPhasicitySpontaneityPropertiesThrombus Aging  +---------+---------------+---------+-----------+----------+--------------+ CFV      Full           Yes      Yes                                 +---------+---------------+---------+-----------+----------+--------------+ SFJ      Full                                                        +---------+---------------+---------+-----------+----------+--------------+ FV Prox  Full           Yes      Yes                                 +---------+---------------+---------+-----------+----------+--------------+ FV Mid   Full           Yes      Yes                                 +---------+---------------+---------+-----------+----------+--------------+ FV DistalFull           Yes      Yes                                 +---------+---------------+---------+-----------+----------+--------------+ PFV      Full                                                        +---------+---------------+---------+-----------+----------+--------------+  POP      Full           Yes      Yes                                 +---------+---------------+---------+-----------+----------+--------------+ PTV      Full                                                        +---------+---------------+---------+-----------+----------+--------------+ PERO     None           No       No                                  +---------+---------------+---------+-----------+----------+--------------+     Summary: BILATERAL: -No evidence of popliteal cyst, bilaterally. -Diffuse subcutaneous edema, bilaterally. RIGHT: - There is no evidence of deep vein thrombosis in the lower extremity.  LEFT: - Findings consistent with acute deep vein thrombosis involving the left peroneal veins.  *See table(s) above for measurements and observations. Electronically signed by Heath Lark on 01/19/2023 at 5:01:19 PM.    Final    US Abdomen Limited RUQ (LIVER/GB)  Result Date: 01/18/2023 CLINICAL DATA:  [id number]  with hepatitis. EXAM: ULTRASOUND ABDOMEN LIMITED RIGHT UPPER QUADRANT COMPARISON:  CT with IV contrast 09/25/2022 FINDINGS: Gallbladder: No gallstones or wall thickening visualized. No sonographic Murphy sign noted by sonographer. Common bile duct: Diameter: 4.0 mm with no intrahepatic biliary prominence. Liver: No focal lesion identified. There is mild generalized increased hepatic echogenicity consistent with the steatosis noted on the prior CT. Portal vein is patent on color Doppler imaging with normal direction of blood flow towards the liver. The portal vein again measures slightly prominent at 15 mm. This was seen previously. Other: None. IMPRESSION: 1. No gallstones, wall thickening or biliary dilatation. 2. Mild hepatic steatosis. Electronically Signed   By: Almira Bar M.D.   On: 01/18/2023 20:53    Cardiac Studies   Echo 09/27/22: IMPRESSIONS    1. Left ventricular ejection fraction, by estimation, is 55 to 60%. The left ventricle has normal function. The left ventricle has no regional wall motion abnormalities. There is mild left ventricular hypertrophy. Left ventricular diastolic parameters are consistent with Grade II diastolic dysfunction (pseudonormalization).  2. Right ventricular systolic function is normal. The right ventricular size is normal. Tricuspid regurgitation signal is inadequate for assessing PA pressure.  3. Left atrial size was mildly dilated.  4. The mitral valve is normal in structure. Trivial mitral valve regurgitation. No evidence of mitral stenosis.  5. The aortic valve is normal in structure. Aortic valve regurgitation is not visualized. Aortic valve sclerosis/calcification is present, without any evidence of aortic stenosis.  6. The inferior vena cava is dilated in size with >50% respiratory variability, suggesting right atrial pressure of 8 mmHg.  Echo 01/19/23:  1. Limited echo   2. Left ventricular ejection fraction, by estimation, is 60 to 65%.  Left  ventricular ejection fraction by PLAX is 62 %. The left ventricle has  normal function. The left ventricle has no regional wall motion  abnormalities.   3. Right ventricular systolic function is normal. The right ventricular  size  is normal. There is moderately elevated pulmonary artery systolic  pressure. The estimated right ventricular systolic pressure is 49.3 mmHg.   4. The aortic valve is tricuspid. Aortic valve regurgitation is not  visualized.   5. The inferior vena cava is dilated in size with <50% respiratory  variability, suggesting right atrial pressure of 15 mmHg.   LE Doppler 01/19/23: Summary:  BILATERAL:  -No evidence of popliteal cyst, bilaterally.  -Diffuse subcutaneous edema, bilaterally.  RIGHT:  - There is no evidence of deep vein thrombosis in the lower extremity.    LEFT:  - Findings consistent with acute deep vein thrombosis involving the left  peroneal veins.   Patient Profile     Mr. Cerveny is a 72M with HFpEF, non-obstructive CAD, hypertension, hyperlipidemia, anxiety, depression, and asthma admitted with acute on chronic HFpEF.  Assessment & Plan    # Acute on chronic HFpEF: # Hypertension: Patient remains massively volume overloaded.  He he has hypervolemic hyponatremia.  Sodium levels have improved with diuresis.  However, his diuresis has slowed and renal function is worsening.  Echo yesterday showed normal systolic function, normal RV function, elevated RVSP and right atrial pressure 15 mmHg.  We will push with his diuresis and increase Lasix to 80 mg. ill need to monitor renal function as he is above his baseline creatinine of 1.7.  If renal function permits after diuresis, ideally we would like to get him on Entresto.  Hemoglobin is A1c is 11%.  Holding Fletcher.  Increase hydralazine to 50 mg every 8 hours.  Discontinue nitroglycerin drip today.  Continue amlodipine.     # Nonobstructive CAD: # Atypical chest pain: # Demand ischemia: #  Hyperlipidemia: High-sensitivity troponin was elevated to 376 and is down trending.  He does report chest pain, but he describes it as heaviness and attributes it to his shortness of breath.  I think this is less likely to be ischemic.  He is massively volume overloaded and his troponin is likely elevated in the setting of demand ischemia.  Continue IV heparin as he now also has a DVT.  If he continues to have chest pain after diuresis will need to consider cardiac catheterization.  He had a left heart cath 03/2022 that revealed 50% D2, 40% mid LAD, and 25% ramus lesions.  Continue with medical management for now.  Continue metoprolol, Imdur, and atorvastatin.  # DVT:  L peroneal vein acute DVT.  Continue IV heparin.  Given this and his elevated pulmonary pressures, it does raise the question of pulmonary embolism.  Given his renal function no plans for CTA of the chest at this time.  Will transition to oral anticoagulation once we decide if he needs a Cath.  For questions or updates, please contact [facility] Please consult [email or web address] for contact info under        Signed, Chilton Si, MD  01/20/2023, 9:00 AM

## 2023-01-20 NOTE — Progress Notes (Signed)
Heart Failure Nurse Navigator Progress Note  PCP: [place], Buren Kos, MD PCP-Cardiologist: Jacques Navy Admission Diagnosis: NSTEMI Admitted from: Home  Presentation:   Leroy Simmons presented with shortness of breath x 1 week, "feels like his lungs were full of fluid" chest pain in his rib area. Swelling 3+ in bilateral lower extremities. BP 183/108, HR 95, BNP 141.8, Troponin 376, IV lasix given and IV heparin started. CXR showing mild CHF,   Patient was educated on the sign and symptoms of heart failure, daily weights when to call his doctor or go to the ED. Diet/ fluid restrictions, reported that he does eat out a lot with his job and traveling, and drinks some soda. Patient educated on taking all medications as prescribed and attending all medical appointments, reported he lives close to th hospital and uses the bus system for all his transportation needs. Patient verbalized his understanding of education, a HF TOC appointment was scheduled for 02/08/2023 @ 2 pm.   ECHO/ LVEF: 60-65%  Clinical Course:  Past Medical History:  Diagnosis Date   Acute gangrenous appendicitis 09/25/2022   Anxiety    CHF (congestive heart failure)    Depression    Diabetes mellitus without complication    Hypertension      Social History   Socioeconomic History   Marital status: Single    Spouse name: Not on file   Number of children: Not on file   Years of education: Not on file   Highest education level: Not on file  Occupational History   Occupation: retail  Tobacco Use   Smoking status: Never   Smokeless tobacco: Never  Vaping Use   Vaping Use: Never used  Substance and Sexual Activity   Alcohol use: Yes    Comment: rarely   Drug use: Never   Sexual activity: Not on file  Other Topics Concern   Not on file  Social History Narrative   Not on file   Social Determinants of Health   Financial Resource Strain: Not on file  Food Insecurity: No Food Insecurity (01/19/2023)   Hunger Vital  Sign    Worried About Running Out of Food in the Last Year: Never true    Ran Out of Food in the Last Year: Never true  Transportation Needs: No Transportation Needs (01/19/2023)   PRAPARE - Administrator, Civil Service (Medical): No    Lack of Transportation (Non-Medical): No  Physical Activity: Not on file  Stress: Not on file  Social Connections: Not on file   Education Assessment and Provision:  Detailed education and instructions provided on heart failure disease management including the following:  Signs and symptoms of Heart Failure When to call the physician Importance of daily weights Low sodium diet Fluid restriction Medication management Anticipated future follow-up appointments  Patient education given on each of the above topics.  Patient acknowledges understanding via teach back method and acceptance of all instructions.  Education Materials:  "Living Better With Heart Failure" Booklet, HF zone tool, & Daily Weight Tracker Tool.  Patient has scale at home: No, navigator delivered one to bedside.  Patient has pill box at home: NA     High Risk Criteria for Readmission and/or Poor Patient Outcomes: Heart failure hospital admissions (last 6 months): 0  No Show rate: 14 % Difficult social situation: No,  Demonstrates medication adherence: yes Primary Language: English Literacy level: reading, writing, and comprehension  Barriers of Care:   Diet/ fluids ( salt, can goods)  eats out a lot with job. Daily weights ( gave a scale too)    Considerations/Referrals:   Referral made to Heart Failure Pharmacist Stewardship: Yes Referral made to Heart Failure CSW/NCM TOC: No Referral made to Heart & Vascular TOC clinic: Yes, 02/08/2023 @ 2 pm  Items for Follow-up on DC/TOC: Diet/ fluid restrictions ( salt/ can foods)  Eats out a lot with job Daily weights ( gave a scale for home use)  Continued HF education   Leroy Simmons, BSN, Interior and spatial designer Chat Only

## 2023-01-20 NOTE — Assessment & Plan Note (Addendum)
Left acute deep vein thrombosis involving the left peroneal veins. (Not right) Continue anticoagulation with heparin drip, transition to oral anticoagulation if no catheterization planned.

## 2023-01-21 DIAGNOSIS — I10 Essential (primary) hypertension: Secondary | ICD-10-CM | POA: Diagnosis not present

## 2023-01-21 DIAGNOSIS — E785 Hyperlipidemia, unspecified: Secondary | ICD-10-CM

## 2023-01-21 DIAGNOSIS — I5033 Acute on chronic diastolic (congestive) heart failure: Secondary | ICD-10-CM | POA: Diagnosis not present

## 2023-01-21 DIAGNOSIS — E1169 Type 2 diabetes mellitus with other specified complication: Secondary | ICD-10-CM

## 2023-01-21 DIAGNOSIS — I82452 Acute embolism and thrombosis of left peroneal vein: Secondary | ICD-10-CM

## 2023-01-21 DIAGNOSIS — N189 Chronic kidney disease, unspecified: Secondary | ICD-10-CM | POA: Diagnosis not present

## 2023-01-21 DIAGNOSIS — N179 Acute kidney failure, unspecified: Secondary | ICD-10-CM | POA: Diagnosis not present

## 2023-01-21 LAB — BASIC METABOLIC PANEL
Anion gap: 7 (ref 5–15)
BUN: 42 mg/dL — ABNORMAL HIGH (ref 6–20)
CO2: 26 mmol/L (ref 22–32)
Calcium: 8.1 mg/dL — ABNORMAL LOW (ref 8.9–10.3)
Chloride: 100 mmol/L (ref 98–111)
Creatinine, Ser: 1.95 mg/dL — ABNORMAL HIGH (ref 0.61–1.24)
GFR, Estimated: 41 mL/min — ABNORMAL LOW (ref >60)
Glucose, Bld: 176 mg/dL — ABNORMAL HIGH (ref 70–99)
Potassium: 3.1 mmol/L — ABNORMAL LOW (ref 3.5–5.1)
Sodium: 133 mmol/L — ABNORMAL LOW (ref 135–145)

## 2023-01-21 LAB — CBC
HCT: 36.1 % — ABNORMAL LOW (ref 39.0–52.0)
Hemoglobin: 12.5 g/dL — ABNORMAL LOW (ref 13.0–17.0)
MCH: 30 pg (ref 26.0–34.0)
MCHC: 34.6 g/dL (ref 30.0–36.0)
MCV: 86.8 fL (ref 80.0–100.0)
Platelets: 212 10*3/uL (ref 150–400)
RBC: 4.16 MIL/uL — ABNORMAL LOW (ref 4.22–5.81)
RDW: 12.3 % (ref 11.5–15.5)
WBC: 6.9 10*3/uL (ref 4.0–10.5)
nRBC: 0 % (ref 0.0–0.2)

## 2023-01-21 LAB — GLUCOSE, CAPILLARY
Glucose-Capillary: 163 mg/dL — ABNORMAL HIGH (ref 70–99)
Glucose-Capillary: 199 mg/dL — ABNORMAL HIGH (ref 70–99)
Glucose-Capillary: 188 mg/dL — ABNORMAL HIGH (ref 70–99)
Glucose-Capillary: 302 mg/dL — ABNORMAL HIGH (ref 70–99)

## 2023-01-21 LAB — HEPARIN LEVEL (UNFRACTIONATED)
Heparin Unfractionated: 0.48 IU/mL (ref 0.30–0.70)
Heparin Unfractionated: 0.6 IU/mL (ref 0.30–0.70)

## 2023-01-21 MED ORDER — POTASSIUM CHLORIDE CRYS ER 20 MEQ PO TBCR
40.0000 meq | EXTENDED_RELEASE_TABLET | Freq: Once | ORAL | Status: AC
  Administered 2023-01-21: 40 meq via ORAL
  Filled 2023-01-21: qty 2

## 2023-01-21 NOTE — Progress Notes (Signed)
Progress Note   Patient: Leroy Simmons ZOX:[id number] DOB: 07/04/1971 DOA: 01/18/2023     3 DOS: the patient was seen and examined on 01/21/2023   Brief hospital course: Leroy Simmons was admitted to the hospital with the working diagnosis of heart failure exacerbation.   52 yo male with the past medical history of heart failure, coronary artery disease. T2DM and asthma who presented with worsening lower extremity edema. Worsening symptoms for one week, with chest tightness, progressive edema, orthopnea and PND. On his initial physical examination his blood pressure was 148/95, HR 84, RR 17 and 02 saturation 99%, lungs with no wheezing or rales, heart with S1 and S2 present and rhythmic, abdomen with no distention and positive lower extremity edema.   Na 137, K 4,3 Cl 101, bicarbonate 23, glucose 489, bun 33 cr 1,71  AST 63, ALT 49  BNP 141  High sensitive troponin 329 and 376  Wbc 7,8 hgb 13.6 plt 238   Chest radiograph with cardiomegaly with bilateral hilar vascular congestion with bilateral interstitial infiltrates, cephalization of the vasculature.  EKG 96 bpm, normal axis, normal intervals, sinus rhythm with no significant ST segment changes, negative T wave lead I and AvL. (Not new changes).   Patient was placed on IV furosemide for diuresis and heparin drip for anticoagulation.   Limited echocardiogram with no wall motion abnormalities and preserved LV systolic function.   04/20 continue volume overloaded.   Assessment and Plan: * Acute on chronic diastolic CHF (congestive heart failure) Follow up limited echocardiogram with preserved LV systolic function with EF 60 to 65%, RV with preserved systolic function, RVSP 49.3 , no atrial dilatation, no pericardial effusion. No significant valvular disease.   Troponin elevation due to demand ischemia (NSTEMI rule out).   Continue with volume overload. Urine output is 3,050 ml  Systolic blood pressure is 131 to 142 mmHg.  Plan to  continue diuresis with furosemide 80 mg q12 hrs.    Continue afterload reduction with hydralazine and isosorbide.  B blockade with metoprolol succinate 50 mg daily. Limited pharmacologic options due to reduced GFR.  Add unna boots  Essential hypertension Continue blood pressure control with amlodipine, hydralazine and isosorbide.    Acute kidney injury superimposed on CKD Baseline serum cr 1,5 to 2,0 (old records personally reviewed). CKD stage 3b, hyponatremia, hypokalemia.   Renal function with serum cr at 1,95 with K at 3,1 and serum bicarbonate at 26,. Na 133 and Mg 1,8   Plan to continue diuresis with IV furosemide Add 40 meq Kcl Follow up renal function and electrolytes in am.  Avoid hypotension and nephrotoxic medications.   Type 2 diabetes mellitus with hyperlipidemia Uncontrolled hyperglycemia.   Continue insulin therapy with sliding scale and basal insulin.  His fasting glucose this am was 176 Continue with pre meal insulin 5 units.   Continue with statin therapy.   Acute deep vein thrombosis (DVT) of left peroneal vein Left acute deep vein thrombosis involving the left peroneal veins. (Not right) Continue anticoagulation with heparin drip, transition to oral anticoagulation if no catheterization planned.   Obesity (BMI 30-39.9) Calculated BMI is 36,1 consistent with obesity class 2.         Subjective: patient continue to have edema, mild improvement but not back to baseline,   Physical Exam: Vitals:   01/21/23 0543 01/21/23 0722 01/21/23 1155 01/21/23 1235  BP: 127/76 107/61  (!) 142/83  Pulse: 82 84  84  Resp: Temp:  98.6 F (37 C)  98.4 F (36.9 C)  TempSrc:  Oral  Oral  SpO2: 99% 97% 97% 97%  Weight:      Height:       Neurology awake and alert ENT with mild pallor Cardiovascular S1 and S2 present and rhythmic with no gallops, rubs or murmurs No JVD Positive lower extremity edema +++ Respiratory with mild rales at bases with  no wheezing Abdomen with no distention  Data Reviewed:    Family Communication: no family at the bedside   Disposition: Status is: Inpatient Remains inpatient appropriate because: heart failure   Planned Discharge Destination: Home      Author: Coralie Keens, MD 01/21/2023 2:45 PM  For on call review [email or web address].

## 2023-01-21 NOTE — Evaluation (Signed)
Physical Therapy Evaluation Patient Details Name: Leroy Simmons MRN: [id number] DOB: July 30, 1971 Today's Date: 01/21/2023  History of Present Illness  Pt admitted with the working diagnosis of heart failure exacerbation, acute kidney injury superimposed on CKD and DVT left peroneal vein - heparin therapeutic.  PMH of heart failure, coronary artery disease. T2DM and asthma who presented with worsening lower extremity edema.  Clinical Impression  Patient presents with mild dependencies in gait and mobility due to increased fluid on LE's, decreased balance and generalized weakness.  Feel patient benefit from continued PT in hospital and potentially f/up PT at home depending on progress.  Anticipate steady progress.        Recommendations for follow up therapy are one component of a multi-disciplinary discharge planning process, led by the attending physician.  Recommendations may be updated based on patient status, additional functional criteria and insurance authorization.  Follow Up Recommendations       Assistance Recommended at Discharge PRN  Patient can return home with the following  A little help with walking and/or transfers;A little help with bathing/dressing/bathroom;Assistance with cooking/housework;Assist for transportation;Help with stairs or ramp for entrance    Equipment Recommendations None recommended by PT  Recommendations for Other Services       Functional Status Assessment Patient has had a recent decline in their functional status and demonstrates the ability to make significant improvements in function in a reasonable and predictable amount of time.     Precautions / Restrictions Precautions Precautions: None Restrictions Weight Bearing Restrictions: No      Mobility  Bed Mobility               General bed mobility comments: pt sitting EOB upon arrival    Transfers Overall transfer level: Needs assistance Equipment used: None Transfers: Sit  to/from Stand Sit to Stand: Supervision                Ambulation/Gait Ambulation/Gait assistance: Min guard Gait Distance (Feet): 150 Feet Assistive device: IV Pole Gait Pattern/deviations: Step-through pattern, Wide base of support Gait velocity: decreased        Stairs            Wheelchair Mobility    Modified Rankin (Stroke Patients Only)       Balance Overall balance assessment: Needs assistance Sitting-balance support: No upper extremity supported, Feet supported Sitting balance-Leahy Scale: Normal     Standing balance support: During functional activity, Reliant on assistive device for balance, Single extremity supported Standing balance-Leahy Scale: Poor                               Pertinent Vitals/Pain Pain Assessment Pain Assessment: No/denies pain    Home Living Family/patient expects to be discharged to:: Private residence Living Arrangements: Non-relatives/Friends Available Help at Discharge: Friend(s) Type of Home: House Home Access: Stairs to enter   Secretary/administrator of Steps: 5 Alternate Level Stairs-Number of Steps: 1-2 Home Layout: Multi-level Home Equipment: None      Prior Function Prior Level of Function : Independent/Modified Independent;Working/employed                     Hand Dominance        Extremity/Trunk Assessment        Lower Extremity Assessment Lower Extremity Assessment: Generalized weakness       Communication   Communication: No difficulties  Cognition Arousal/Alertness: Awake/alert Behavior During Therapy: WFL for tasks  assessed/performed Overall Cognitive Status: Within Functional Limits for tasks assessed                                          General Comments      Exercises     Assessment/Plan    PT Assessment Patient needs continued PT services  PT Problem List Decreased activity tolerance;Decreased balance;Decreased  mobility;Cardiopulmonary status limiting activity       PT Treatment Interventions Gait training;DME instruction;Therapeutic activities;Functional mobility training;Balance training;Patient/family education    PT Goals (Current goals can be found in the Care Plan section)  Acute Rehab PT Goals Patient Stated Goal: get this fluid down PT Goal Formulation: With patient Time For Goal Achievement: 01/28/23 Potential to Achieve Goals: Good    Frequency Min 1X/week     Co-evaluation               AM-PAC PT "6 Clicks" Mobility  Outcome Measure Help needed turning from your back to your side while in a flat bed without using bedrails?: None Help needed moving from lying on your back to sitting on the side of a flat bed without using bedrails?: None Help needed moving to and from a bed to a chair (including a wheelchair)?: A Little Help needed standing up from a chair using your arms (e.g., wheelchair or bedside chair)?: A Little Help needed to walk in hospital room?: A Little Help needed climbing 3-5 steps with a railing? : A Little 6 Click Score: 20    End of Session Equipment Utilized During Treatment: Gait belt Activity Tolerance: Patient tolerated treatment well Patient left: in bed;with call bell/phone within reach (sitting EOB)   PT Visit Diagnosis: Unsteadiness on feet (R26.81);Muscle weakness (generalized) (M62.81)    Time: 1130-1150 PT Time Calculation (min) (ACUTE ONLY): 20 min   Charges:   PT Evaluation $PT Eval Moderate Complexity: 1 Mod          01/21/2023 Margie, PT Acute Rehabilitation Services Office:  [phone]   Olivia Canter 01/21/2023, 12:06 PM

## 2023-01-21 NOTE — Progress Notes (Signed)
Orthopedic Tech Progress Note Patient Details:  Leroy Simmons 1970-10-25 [id number]  Ortho Devices Type of Ortho Device: Radio broadcast assistant Ortho Device/Splint Location: Bi LE Ortho Device/Splint Interventions: Application   Post Interventions Patient Tolerated: Well  [name] 01/21/2023, 5:19 PM

## 2023-01-21 NOTE — Progress Notes (Signed)
Rounding Note    Patient Name: Leroy Simmons Date of Encounter: 01/21/2023  [facility] Cardiologist: Parke Poisson, MD   Subjective   No acute events overnight. Very concerned about his fluid retention, kidney function. Discussed fluid restrictions, diuretics, medications today. Has intermittent mild chest tightness, overall improving with diuresis.  Inpatient Medications    Scheduled Meds:  amLODipine  5 mg Oral Daily   aspirin EC  81 mg Oral Daily   atorvastatin  80 mg Oral Daily   furosemide  80 mg Intravenous BID   hydrALAZINE  50 mg Oral Q8H   insulin aspart  0-5 Units Subcutaneous QHS   insulin aspart  0-9 Units Subcutaneous TID WC   insulin aspart  5 Units Subcutaneous TID WC   insulin glargine-yfgn  15 Units Subcutaneous BID   isosorbide mononitrate  30 mg Oral Daily   metoprolol succinate  50 mg Oral Daily   Continuous Infusions:  heparin 2,600 Units/hr (01/21/23 0159)   PRN Meds: acetaminophen **OR** acetaminophen, albuterol, bisacodyl, mouth rinse   Vital Signs    Vitals:   01/21/23 0543 01/21/23 0722 01/21/23 1155 01/21/23 1235  BP: 127/76 107/61  (!) 142/83  Pulse: 82 84  84  Resp: 16 18  16   Temp:  98.6 F (37 C)  98.4 F (36.9 C)  TempSrc:  Oral  Oral  SpO2: 99% 97% 97% 97%  Weight:      Height:        Intake/Output Summary (Last 24 hours) at 01/21/2023 1347 Last data filed at 01/21/2023 1236 Gross per 24 hour  Intake 1266.47 ml  Output 3850 ml  Net -2583.53 ml      01/21/2023    4:32 AM 01/20/2023    4:00 AM 01/18/2023    8:49 AM  Last 3 Weights  Weight (lbs) 235 lb 7.2 oz 237 lb 8 oz 230 lb  Weight (kg) 106.8 kg 107.729 kg 104.327 kg      Telemetry    SR, 19 beats of NSVT yesterday afternoon - Personally Reviewed  ECG    4/19 NST at 88 bpm - Personally Reviewed  Physical Exam   GEN: No acute distress.   Neck: elevated JVD to mid neck sitting upright Cardiac: RRR, no murmurs, rubs, or gallops.  Respiratory:  Clear to auscultation bilaterally. GI: Soft, nontender, non-distended  MS: 3+ bilateral pitting LE edema; No deformity. Neuro:  Nonfocal  Psych: Normal affect   Labs    High Sensitivity Troponin:   Recent Labs  Lab 01/18/23 0641 01/18/23 0851 01/19/23 0617  TROPONINIHS 329* 376* 195*     Chemistry Recent Labs  Lab 01/18/23 0641 01/19/23 0214 01/19/23 0617 01/20/23 0057 01/21/23 0225  NA 132* 132* 134* 135 133*  K 4.3 3.9 4.0 3.5 3.1*  CL 101 100 98 98 100  CO2 23 25 25 24 26   GLUCOSE 489* 353* 346* 213* 176*  BUN 33* 36* 36* 42* 42*  CREATININE 1.71* 1.86* 1.97* 2.12* 1.95*  CALCIUM 8.3* 8.0* 8.2* 8.1* 8.1*  MG  --  1.8  --   --   --   PROT 6.1*  --  5.7*  --   --   ALBUMIN 2.5*  --  2.4*  --   --   AST 63*  --  24  --   --   ALT 49*  --  35  --   --   ALKPHOS 140*  --  128*  --   --  BILITOT 0.6  --  0.8  --   --   GFRNONAA 48* 44* 41* 37* 41*  ANIONGAP Lipids  Recent Labs  Lab 01/19/23 0617  CHOL 218*  TRIG 228*  HDL 47  LDLCALC 125*  CHOLHDL 4.6    Hematology Recent Labs  Lab 01/19/23 0617 01/20/23 0057 01/21/23 0225  WBC 10.3 9.5 6.9  RBC 4.10* 3.78* 4.16*  HGB 12.3* 11.3* 12.5*  HCT 35.6* 32.8* 36.1*  MCV 86.8 86.8 86.8  MCH 30.0 29.9 30.0  MCHC 34.6 34.5 34.6  RDW 12.5 12.3 12.3  PLT 209 176 212   Thyroid No results for input(s): "TSH", "FREET4" in the last 168 hours.  BNP Recent Labs  Lab 01/18/23 0641  BNP 141.8*    DDimer No results for input(s): "DDIMER" in the last 168 hours.   Radiology    ECHOCARDIOGRAM LIMITED  Result Date: 01/19/2023    ECHOCARDIOGRAM LIMITED REPORT   Patient Name:   Leroy Simmons Date of Exam: 01/19/2023 Medical Rec #:  [id number]      Height:       68.0 in Accession #:    [id number]     Weight:       230.0 lb Date of Birth:  Sep 18, 1971      BSA:          2.169 m Patient Age:    52 years       BP:           117/65 mmHg Patient Gender: M              HR:           89 bpm. Exam Location:   Inpatient Procedure: 2D Echo, Cardiac Doppler and Color Doppler Indications:    Abnormal ECG  History:        Patient has prior history of Echocardiogram examinations, most                 recent 03/17/2022. CHF, CAD; Risk Factors:Hypertension and                 Diabetes.  Sonographer:    Wallie Char Referring Phys: [id number] [facility] GOEL IMPRESSIONS  1. Limited echo  2. Left ventricular ejection fraction, by estimation, is 60 to 65%. Left ventricular ejection fraction by PLAX is 62 %. The left ventricle has normal function. The left ventricle has no regional wall motion abnormalities.  3. Right ventricular systolic function is normal. The right ventricular size is normal. There is moderately elevated pulmonary artery systolic pressure. The estimated right ventricular systolic pressure is 49.3 mmHg.  4. The aortic valve is tricuspid. Aortic valve regurgitation is not visualized.  5. The inferior vena cava is dilated in size with <50% respiratory variability, suggesting right atrial pressure of 15 mmHg. Comparison(s): Changes from prior study are noted. 03/17/2022: LVEF 55-60%. FINDINGS  Left Ventricle: Left ventricular ejection fraction, by estimation, is 60 to 65%. Left ventricular ejection fraction by PLAX is 62 %. The left ventricle has normal function. The left ventricle has no regional wall motion abnormalities. The left ventricular internal cavity size was normal in size. There is no left ventricular hypertrophy. Right Ventricle: The right ventricular size is normal. No increase in right ventricular wall thickness. Right ventricular systolic function is normal. There is moderately elevated pulmonary artery systolic pressure. The tricuspid regurgitant velocity is 2.93 m/s, and with an assumed right atrial pressure of 15 mmHg,  the estimated right ventricular systolic pressure is 49.3 mmHg. Left Atrium: Left atrial size was normal in size. Right Atrium: Right atrial size was normal in size. Pericardium: There is no  evidence of pericardial effusion. Mitral Valve: MV peak gradient, 7.4 mmHg. The mean mitral valve gradient is 3.0 mmHg. Tricuspid Valve: The tricuspid valve is grossly normal. Tricuspid valve regurgitation is trivial. Aortic Valve: The aortic valve is tricuspid. Aortic valve regurgitation is not visualized. Aortic valve mean gradient measures 3.0 mmHg. Aortic valve peak gradient measures 5.4 mmHg. Aortic valve area, by VTI measures 3.45 cm. Aorta: The aortic root and ascending aorta are structurally normal, with no evidence of dilitation. Venous: The inferior vena cava is dilated in size with less than 50% respiratory variability, suggesting right atrial pressure of 15 mmHg. IAS/Shunts: No atrial level shunt detected by color flow Doppler. LEFT VENTRICLE PLAX 2D LV EF:         Left ventricular ejection fraction by PLAX is 62 %. LVIDd:         4.80 cm LVIDs:         3.20 cm LV PW:         1.00 cm LV IVS:        1.00 cm LVOT diam:     1.90 cm LV SV:         62 LV SV Index:   28 LVOT Area:     2.84 cm  LV Volumes (MOD) LV vol d, MOD A2C: 69.3 ml LV vol d, MOD A4C: 87.1 ml LV vol s, MOD A2C: 29.4 ml LV vol s, MOD A4C: 36.0 ml LV SV MOD A2C:     39.9 ml LV SV MOD A4C:     87.1 ml LV SV MOD BP:      44.9 ml RIGHT VENTRICLE         IVC TAPSE (M-mode): 2.1 cm  IVC diam: 2.40 cm LEFT ATRIUM             Index        RIGHT ATRIUM           Index LA diam:        4.60 cm 2.12 cm/m   RA Area:     12.30 cm LA Vol (A2C):   47.5 ml 21.90 ml/m  RA Volume:   22.80 ml  10.51 ml/m LA Vol (A4C):   62.8 ml 28.96 ml/m LA Biplane Vol: 54.3 ml 25.04 ml/m  AORTIC VALVE AV Area (Vmax):    3.13 cm AV Area (Vmean):   3.73 cm AV Area (VTI):     3.45 cm AV Vmax:           116.00 cm/s AV Vmean:          75.500 cm/s AV VTI:            0.179 m AV Peak Grad:      5.4 mmHg AV Mean Grad:      3.0 mmHg LVOT Vmax:         128.00 cm/s LVOT Vmean:        99.200 cm/s LVOT VTI:          0.218 m LVOT/AV VTI ratio: 1.22  AORTA Ao Root diam: 3.50 cm  Ao Asc diam:  3.50 cm MITRAL VALVE                TRICUSPID VALVE MV Area (PHT): 4.60 cm     TR Peak grad:   34.3  mmHg MV Area VTI:   1.89 cm     TR Vmax:        293.00 cm/s MV Peak grad:  7.4 mmHg MV Mean grad:  3.0 mmHg     SHUNTS MV Vmax:       1.36 m/s     Systemic VTI:  0.22 m MV Vmean:      81.9 cm/s    Systemic Diam: 1.90 cm MV Decel Time: 165 msec MV E velocity: 135.00 cm/s MV A velocity: 52.50 cm/s MV E/A ratio:  2.57 Zoila Shutter MD Electronically signed by Zoila Shutter MD Signature Date/Time: 01/19/2023/5:09:50 PM    Final     Cardiac Studies   Echo 09/27/22:  1. Left ventricular ejection fraction, by estimation, is 55 to 60%. The left ventricle has normal function. The left ventricle has no regional wall motion abnormalities. There is mild left ventricular hypertrophy. Left ventricular diastolic parameters are consistent with Grade II diastolic dysfunction (pseudonormalization).  2. Right ventricular systolic function is normal. The right ventricular size is normal. Tricuspid regurgitation signal is inadequate for assessing PA pressure.  3. Left atrial size was mildly dilated.  4. The mitral valve is normal in structure. Trivial mitral valve regurgitation. No evidence of mitral stenosis.  5. The aortic valve is normal in structure. Aortic valve regurgitation is not visualized. Aortic valve sclerosis/calcification is present, without any evidence of aortic stenosis.  6. The inferior vena cava is dilated in size with >50% respiratory variability, suggesting right atrial pressure of 8 mmHg.   Echo 01/19/23:  1. Limited echo   2. Left ventricular ejection fraction, by estimation, is 60 to 65%. Left  ventricular ejection fraction by PLAX is 62 %. The left ventricle has  normal function. The left ventricle has no regional wall motion  abnormalities.   3. Right ventricular systolic function is normal. The right ventricular  size is normal. There is moderately elevated pulmonary  artery systolic  pressure. The estimated right ventricular systolic pressure is 49.3 mmHg.   4. The aortic valve is tricuspid. Aortic valve regurgitation is not  visualized.   5. The inferior vena cava is dilated in size with <50% respiratory  variability, suggesting right atrial pressure of 15 mmHg.    LE Doppler 01/19/23: Summary:  BILATERAL:  -No evidence of popliteal cyst, bilaterally.  -Diffuse subcutaneous edema, bilaterally.  RIGHT:  - There is no evidence of deep vein thrombosis in the lower extremity.    LEFT:  - Findings consistent with acute deep vein thrombosis involving the left  peroneal veins.   Patient Profile     52 y.o. male with HFpEF, non-obstructive CAD, hypertension, hyperlipidemia, anxiety, depression, and asthma admitted with acute on chronic HFpEF   Assessment & Plan    Acute on chronic HFpEF Hypertension Acute kidney injury on chronic kidney disease stage 3b -Echo showed normal systolic function, normal RV function, elevated RVSP and right atrial pressure 15 mmHg. -continue diuresis -continue hydralazine, imdur. No ACEi/ARB/ARNI/MRA given AKI. Also on amlodipine. -continue diuresis -weights do not agree. Admission weight 104.3 kg, but weight 4/19 was 107.7 kg. Weight today 106.8 kg. Charted net negative 3.8 L. -Cr today 1.95, improved from 2.12 on 4/19 -K 3.1, repletion already ordered   Nonobstructive CAD: Atypical chest pain: Demand ischemia: Hyperlipidemia: -High-sensitivity troponin was elevated to 376 and is down trending. Likely demand ischemia -continue heparin, if no further chest pain then transition to DOAC for DVT -left heart cath 03/2022 that revealed 50% D2, 40%  mid LAD, and 25% ramus lesions  -Continue metoprolol, Imdur, and atorvastatin.   DVT:  -L peroneal vein acute DVT.    Type II diabetes -A1c 11% -on insulin -as blood sugars improve, consider restarting SLGT2i  For questions or updates, please contact [facility] Please consult [email or web address] for contact info under    Signed, Jodelle Red, MD  01/21/2023, 1:47 PM

## 2023-01-21 NOTE — Progress Notes (Signed)
ANTICOAGULATION CONSULT NOTE - Follow Up Consult  Pharmacy Consult for Heparin Indication: chest pain/ACS; new LLE DVT  No Known Allergies  Patient Measurements: Height:  (172.7 cm) Weight: 107.7 kg (237 lb 8 oz) IBW/kg (Calculated) : 68.4 Heparin Dosing Weight: 91.1 kg  Vital Signs: Temp: 98.4 F (36.9 C) (04/19 2356) Temp Source: Oral (04/19 2356) BP: 147/87 (04/20 0000) Pulse Rate: 88 (04/20 0000)  Labs: Recent Labs    01/18/23 0641 01/18/23 0851 01/18/23 1755 01/19/23 0617 01/19/23 1010 01/20/23 0057 01/20/23 1811 01/21/23 0225  HGB 13.6  --    < > 12.3*  --  11.3*  --  12.5*  HCT 39.3  --    < > 35.6*  --  32.8*  --  36.1*  PLT 238  --    < > 209  --  176  --  212  APTT  --   --   --  52*  --   --   --   --   LABPROT  --   --   --  14.1  --   --   --   --   INR  --   --   --  1.1  --   --   --   --   HEPARINUNFRC  --   --    < > 0.26*   < > 0.25* 0.23* 0.48  CREATININE 1.71*  --    < > 1.97*  --  2.12*  --  1.95*  TROPONINIHS 329* 376*  --  195*  --   --   --   --    < > = values in this interval not displayed.     Estimated Creatinine Clearance: 54.5 mL/min (A) (by C-G formula based on SCr of 1.95 mg/dL (H)).   Medications:  Scheduled:   amLODipine  5 mg Oral Daily   aspirin EC  81 mg Oral Daily   atorvastatin  80 mg Oral Daily   furosemide  80 mg Intravenous BID   hydrALAZINE  50 mg Oral Q8H   insulin aspart  0-5 Units Subcutaneous QHS   insulin aspart  0-9 Units Subcutaneous TID WC   insulin aspart  5 Units Subcutaneous TID WC   insulin glargine-yfgn  15 Units Subcutaneous BID   isosorbide mononitrate  30 mg Oral Daily   metoprolol succinate  50 mg Oral Daily   Infusions:   heparin 2,600 Units/hr (01/21/23 0159)    Assessment: 52 yo male originally on heparin for chest pain.  Now with new LLE DVT.  Heparin remains below goal at level 0.23 on 2400 units/hr.    Goal of Therapy:  Heparin level 0.3-0.7 units/ml Monitor platelets by  anticoagulation protocol: Yes   Plan:  Continue IV heparin infusion at 2600 units/hr Heparin level in 6 hours Daily heparin level and CBC while on heparin Follow-up plans for oral anticoagulation pending need for cardiac cath.   Reece Leader, Colon Flattery, BCCP Clinical Pharmacist  01/21/2023 3:42 AM   [facility] pharmacy phone numbers are listed on [email or web address]

## 2023-01-21 NOTE — Progress Notes (Signed)
ANTICOAGULATION CONSULT NOTE - Follow Up Consult  Pharmacy Consult for Heparin Indication: chest pain/ACS; new LLE DVT  No Known Allergies  Patient Measurements: Height:  (172.7 cm) Weight: 106.8 kg (235 lb 7.2 oz) IBW/kg (Calculated) : 68.4 Heparin Dosing Weight: 91.1 kg  Vital Signs: Temp: 98.6 F (37 C) (04/20 0722) Temp Source: Oral (04/20 0722) BP: 107/61 (04/20 0722) Pulse Rate: 84 (04/20 0722)  Labs: Recent Labs    01/19/23 0617 01/19/23 1010 01/20/23 0057 01/20/23 1811 01/21/23 0225 01/21/23 0952  HGB 12.3*  --  11.3*  --  12.5*  --   HCT 35.6*  --  32.8*  --  36.1*  --   PLT 209  --  176  --  212  --   APTT 52*  --   --   --   --   --   LABPROT 14.1  --   --   --   --   --   INR 1.1  --   --   --   --   --   HEPARINUNFRC 0.26*   < > 0.25* 0.23* 0.48 0.60  CREATININE 1.97*  --  2.12*  --  1.95*  --   TROPONINIHS 195*  --   --   --   --   --    < > = values in this interval not displayed.     Estimated Creatinine Clearance: 54.3 mL/min (A) (by C-G formula based on SCr of 1.95 mg/dL (H)).   Medications:  Scheduled:   amLODipine  5 mg Oral Daily   aspirin EC  81 mg Oral Daily   atorvastatin  80 mg Oral Daily   furosemide  80 mg Intravenous BID   hydrALAZINE  50 mg Oral Q8H   insulin aspart  0-5 Units Subcutaneous QHS   insulin aspart  0-9 Units Subcutaneous TID WC   insulin aspart  5 Units Subcutaneous TID WC   insulin glargine-yfgn  15 Units Subcutaneous BID   isosorbide mononitrate  30 mg Oral Daily   metoprolol succinate  50 mg Oral Daily   Infusions:  heparin 2,600 Units/hr (01/21/23 0159)    Assessment: 52 yo male originally on heparin for chest pain. Now with new LLE DVT.    Confirmatory heparin level therapeutic at 0.60 with heparin at 2,600 units/hr. Hgb 12.5, PLT 212 wnl. No issues with heparin infusion or s/sx bleeding per RN.    Goal of Therapy:  Heparin level 0.3-0.7 units/ml Monitor platelets by anticoagulation protocol:  Yes   Plan:  Continue IV heparin infusion at 2600 units/hr Daily heparin level and CBC while on heparin Follow-up plans for oral anticoagulation pending need for cardiac cath Monitor for s/sx bleeding   Larena Sox, PharmD PGY1 Pharmacy Resident   01/21/2023  11:26 AM

## 2023-01-22 DIAGNOSIS — I10 Essential (primary) hypertension: Secondary | ICD-10-CM | POA: Diagnosis not present

## 2023-01-22 DIAGNOSIS — I82452 Acute embolism and thrombosis of left peroneal vein: Secondary | ICD-10-CM | POA: Diagnosis not present

## 2023-01-22 DIAGNOSIS — N179 Acute kidney failure, unspecified: Secondary | ICD-10-CM | POA: Diagnosis not present

## 2023-01-22 DIAGNOSIS — I5033 Acute on chronic diastolic (congestive) heart failure: Secondary | ICD-10-CM | POA: Diagnosis not present

## 2023-01-22 LAB — HEPARIN LEVEL (UNFRACTIONATED)
Heparin Unfractionated: 0.71 IU/mL — ABNORMAL HIGH (ref 0.30–0.70)
Heparin Unfractionated: 0.81 IU/mL — ABNORMAL HIGH (ref 0.30–0.70)
Heparin Unfractionated: 0.51 IU/mL (ref 0.30–0.70)

## 2023-01-22 LAB — BASIC METABOLIC PANEL
Anion gap: 10 (ref 5–15)
BUN: 45 mg/dL — ABNORMAL HIGH (ref 6–20)
CO2: 27 mmol/L (ref 22–32)
Calcium: 8.8 mg/dL — ABNORMAL LOW (ref 8.9–10.3)
Chloride: 99 mmol/L (ref 98–111)
Creatinine, Ser: 1.79 mg/dL — ABNORMAL HIGH (ref 0.61–1.24)
GFR, Estimated: 46 mL/min — ABNORMAL LOW (ref >60)
Glucose, Bld: 202 mg/dL — ABNORMAL HIGH (ref 70–99)
Potassium: 3.8 mmol/L (ref 3.5–5.1)
Sodium: 136 mmol/L (ref 135–145)

## 2023-01-22 LAB — GLUCOSE, CAPILLARY
Glucose-Capillary: 156 mg/dL — ABNORMAL HIGH (ref 70–99)
Glucose-Capillary: 153 mg/dL — ABNORMAL HIGH (ref 70–99)
Glucose-Capillary: 220 mg/dL — ABNORMAL HIGH (ref 70–99)
Glucose-Capillary: 229 mg/dL — ABNORMAL HIGH (ref 70–99)

## 2023-01-22 LAB — CBC
HCT: 42.9 % (ref 39.0–52.0)
Hemoglobin: 14.1 g/dL (ref 13.0–17.0)
MCH: 29.6 pg (ref 26.0–34.0)
MCHC: 32.9 g/dL (ref 30.0–36.0)
MCV: 90.1 fL (ref 80.0–100.0)
Platelets: 277 10*3/uL (ref 150–400)
RBC: 4.76 MIL/uL (ref 4.22–5.81)
RDW: 12.3 % (ref 11.5–15.5)
WBC: 6.8 10*3/uL (ref 4.0–10.5)
nRBC: 0 % (ref 0.0–0.2)

## 2023-01-22 NOTE — Progress Notes (Signed)
ANTICOAGULATION CONSULT NOTE - Follow Up Consult  Pharmacy Consult for Heparin Indication: chest pain/ACS; new LLE DVT  No Known Allergies  Patient Measurements: Height:  (172.7 cm) Weight: 104.2 kg (229 lb 11.5 oz) IBW/kg (Calculated) : 68.4 Heparin Dosing Weight: 91.1 kg  Vital Signs: Temp: 97.9 F (36.6 C) (04/21 0745) Temp Source: Oral (04/21 0745) BP: 122/61 (04/21 0505) Pulse Rate: 87 (04/21 0505)  Labs: Recent Labs    01/20/23 0057 01/20/23 1811 01/21/23 0225 01/21/23 0952 01/22/23 0106  HGB 11.3*  --  12.5*  --  14.1  HCT 32.8*  --  36.1*  --  42.9  PLT 176  --  212  --  277  HEPARINUNFRC 0.25*   < > 0.48 0.60 0.71*  CREATININE 2.12*  --  1.95*  --  1.79*   < > = values in this interval not displayed.     Estimated Creatinine Clearance: 58.4 mL/min (A) (by C-G formula based on SCr of 1.79 mg/dL (H)).   Medications:  Scheduled:   amLODipine  5 mg Oral Daily   aspirin EC  81 mg Oral Daily   atorvastatin  80 mg Oral Daily   furosemide  80 mg Intravenous BID   hydrALAZINE  50 mg Oral Q8H   insulin aspart  0-5 Units Subcutaneous QHS   insulin aspart  0-9 Units Subcutaneous TID WC   insulin aspart  5 Units Subcutaneous TID WC   insulin glargine-yfgn  15 Units Subcutaneous BID   isosorbide mononitrate  30 mg Oral Daily   metoprolol succinate  50 mg Oral Daily   Infusions:  heparin 2,600 Units/hr (01/21/23 2245)    Assessment: 52 yo male originally on heparin for chest pain. Now with new LLE DVT.    Daily heparin level came back slightly supratherapeutic at 0.71. Hgb 14.1, PLT 277 wnl. No issues with heparin infusion or s/sx bleeding per RN.    Goal of Therapy:  Heparin level 0.3-0.7 units/ml Monitor platelets by anticoagulation protocol: Yes   Plan:  Decrease heparin to 2500 units/hr Check 6 hour heparin level Daily heparin level and CBC while on heparin Follow-up plans for oral anticoagulation pending need for cardiac cath Monitor for  s/sx bleeding   Larena Sox, PharmD PGY1 Pharmacy Resident   01/22/2023  8:17 AM

## 2023-01-22 NOTE — Progress Notes (Signed)
Rounding Note    Patient Name: Leroy Simmons Date of Encounter: 01/22/2023  [facility] Cardiologist: Parke Poisson, MD   Subjective   No acute events overnight. Continues to make excellent urine. Legs wrapped today.  Inpatient Medications    Scheduled Meds:  amLODipine  5 mg Oral Daily   aspirin EC  81 mg Oral Daily   atorvastatin  80 mg Oral Daily   furosemide  80 mg Intravenous BID   hydrALAZINE  50 mg Oral Q8H   insulin aspart  0-5 Units Subcutaneous QHS   insulin aspart  0-9 Units Subcutaneous TID WC   insulin aspart  5 Units Subcutaneous TID WC   insulin glargine-yfgn  15 Units Subcutaneous BID   isosorbide mononitrate  30 mg Oral Daily   metoprolol succinate  50 mg Oral Daily   Continuous Infusions:  heparin 2,500 Units/hr (01/22/23 0901)   PRN Meds: acetaminophen **OR** acetaminophen, albuterol, bisacodyl, mouth rinse   Vital Signs    Vitals:   01/22/23 0745 01/22/23 1309 01/22/23 1311 01/22/23 1511  BP: (!) 115/55 (!) 140/89 (!) 140/89 136/79  Pulse: 85  81 85  Resp: Temp: 97.9 F (36.6 C) 98.2 F (36.8 C)  98.4 F (36.9 C)  TempSrc: Oral Oral  Oral  SpO2: 98%  99% 94%  Weight:      Height:        Intake/Output Summary (Last 24 hours) at 01/22/2023 1537 Last data filed at 01/22/2023 1513 Gross per 24 hour  Intake 1461.86 ml  Output 5450 ml  Net -3988.14 ml      01/22/2023    5:05 AM 01/21/2023    4:32 AM 01/20/2023    4:00 AM  Last 3 Weights  Weight (lbs) 229 lb 11.5 oz 235 lb 7.2 oz 237 lb 8 oz  Weight (kg) 104.2 kg 106.8 kg 107.729 kg      Telemetry    NSR - Personally Reviewed  ECG    4/19 NSR at 88 bpm - Personally Reviewed  Physical Exam   GEN: Well nourished, well developed in no acute distress NECK: No JVD visible sitting upright CARDIAC: regular rhythm, normal S1 and S2, no rubs or gallops. No murmur. VASCULAR: Radial pulses 2+ bilaterally.  RESPIRATORY:  Clear to auscultation without rales,  wheezing or rhonchi  ABDOMEN: Soft, non-tender, non-distended MUSCULOSKELETAL:  Moves all 4 limbs independently SKIN: Warm and dry, bilateral legs wrapped with 1-2+ pitting edema NEUROLOGIC:  No focal neuro deficits noted. PSYCHIATRIC:  Normal affect    Labs    High Sensitivity Troponin:   Recent Labs  Lab 01/18/23 0641 01/18/23 0851 01/19/23 0617  TROPONINIHS 329* 376* 195*     Chemistry Recent Labs  Lab 01/18/23 0641 01/19/23 0214 01/19/23 0617 01/20/23 0057 01/21/23 0225 01/22/23 0106  NA 132* 132* 134* 135 133* 136  K 4.3 3.9 4.0 3.5 3.1* 3.8  CL 101 100 98 98 100 99  CO2 GLUCOSE 489* 353* 346* 213* 176* 202*  BUN 33* 36* 36* 42* 42* 45*  CREATININE 1.71* 1.86* 1.97* 2.12* 1.95* 1.79*  CALCIUM 8.3* 8.0* 8.2* 8.1* 8.1* 8.8*  MG  --  1.8  --   --   --   --   PROT 6.1*  --  5.7*  --   --   --   ALBUMIN 2.5*  --  2.4*  --   --   --  AST 63*  --  24  --   --   --   ALT 49*  --  35  --   --   --   ALKPHOS 140*  --  128*  --   --   --   BILITOT 0.6  --  0.8  --   --   --   GFRNONAA 48* 44* 41* 37* 41* 46*  ANIONGAP Lipids  Recent Labs  Lab 01/19/23 0617  CHOL 218*  TRIG 228*  HDL 47  LDLCALC 125*  CHOLHDL 4.6    Hematology Recent Labs  Lab 01/20/23 0057 01/21/23 0225 01/22/23 0106  WBC 9.5 6.9 6.8  RBC 3.78* 4.16* 4.76  HGB 11.3* 12.5* 14.1  HCT 32.8* 36.1* 42.9  MCV 86.8 86.8 90.1  MCH 29.9 30.0 29.6  MCHC 34.5 34.6 32.9  RDW 12.3 12.3 12.3  PLT 176 212 277   Thyroid No results for input(s): "TSH", "FREET4" in the last 168 hours.  BNP Recent Labs  Lab 01/18/23 0641  BNP 141.8*    DDimer No results for input(s): "DDIMER" in the last 168 hours.   Radiology    No results found.  Cardiac Studies   Echo 09/27/22:  1. Left ventricular ejection fraction, by estimation, is 55 to 60%. The left ventricle has normal function. The left ventricle has no regional wall motion abnormalities. There is mild left  ventricular hypertrophy. Left ventricular diastolic parameters are consistent with Grade II diastolic dysfunction (pseudonormalization).  2. Right ventricular systolic function is normal. The right ventricular size is normal. Tricuspid regurgitation signal is inadequate for assessing PA pressure.  3. Left atrial size was mildly dilated.  4. The mitral valve is normal in structure. Trivial mitral valve regurgitation. No evidence of mitral stenosis.  5. The aortic valve is normal in structure. Aortic valve regurgitation is not visualized. Aortic valve sclerosis/calcification is present, without any evidence of aortic stenosis.  6. The inferior vena cava is dilated in size with >50% respiratory variability, suggesting right atrial pressure of 8 mmHg.   Echo 01/19/23:  1. Limited echo   2. Left ventricular ejection fraction, by estimation, is 60 to 65%. Left  ventricular ejection fraction by PLAX is 62 %. The left ventricle has  normal function. The left ventricle has no regional wall motion  abnormalities.   3. Right ventricular systolic function is normal. The right ventricular  size is normal. There is moderately elevated pulmonary artery systolic  pressure. The estimated right ventricular systolic pressure is 49.3 mmHg.   4. The aortic valve is tricuspid. Aortic valve regurgitation is not  visualized.   5. The inferior vena cava is dilated in size with <50% respiratory  variability, suggesting right atrial pressure of 15 mmHg.    LE Doppler 01/19/23: Summary:  BILATERAL:  -No evidence of popliteal cyst, bilaterally.  -Diffuse subcutaneous edema, bilaterally.  RIGHT:  - There is no evidence of deep vein thrombosis in the lower extremity.    LEFT:  - Findings consistent with acute deep vein thrombosis involving the left  peroneal veins.   Patient Profile     53 y.o. male with HFpEF, non-obstructive CAD, hypertension, hyperlipidemia, anxiety, depression, and asthma admitted with  acute on chronic HFpEF   Assessment & Plan    Acute on chronic HFpEF Hypertension Acute kidney injury on chronic kidney disease stage 3b -Echo showed normal systolic function, normal RV function, elevated RVSP and right atrial  pressure 15 mmHg. -continue diuresis -continue hydralazine, imdur. No ACEi/ARB/ARNI/MRA given AKI. Also on amlodipine. -continue diuresis -weights do not agree. Admission weight 104.3 kg, but weight 4/19 was 107.7 kg. Weight today 104.2 kg. Charted net negative 5.2 L. -Cr today 1.79, improved from 2.12 on 4/19 -K 3.8   Nonobstructive CAD: Atypical chest pain: Demand ischemia: Hyperlipidemia: -High-sensitivity troponin was elevated to 376 and is down trending. Likely demand ischemia -continue heparin until tomorrow, if no further chest pain then transition to DOAC for DVT -left heart cath 03/2022 that revealed 50% D2, 40% mid LAD, and 25% ramus lesions  -Continue metoprolol, Imdur, and atorvastatin.   DVT:  -L peroneal vein acute DVT.    Type II diabetes -A1c 11% -on insulin -as blood sugars improve, consider restarting SLGT2i  For questions or updates, please contact [facility] Please consult [email or web address] for contact info under    Signed, Jodelle Red, MD  01/22/2023, 3:37 PM

## 2023-01-22 NOTE — Progress Notes (Signed)
ANTICOAGULATION CONSULT NOTE - Follow Up Consult  Pharmacy Consult for Heparin Indication: chest pain/ACS; new LLE DVT  No Known Allergies  Patient Measurements: Height:  (172.7 cm) Weight: 104.2 kg (229 lb 11.5 oz) IBW/kg (Calculated) : 68.4 Heparin Dosing Weight: 91.1 kg  Vital Signs: Temp: 98.2 F (36.8 C) (04/21 1309) Temp Source: Oral (04/21 1309) BP: 140/89 (04/21 1311) Pulse Rate: 81 (04/21 1311)  Labs: Recent Labs    01/20/23 0057 01/20/23 1811 01/21/23 0225 01/21/23 0952 01/22/23 0106 01/22/23 1411  HGB 11.3*  --  12.5*  --  14.1  --   HCT 32.8*  --  36.1*  --  42.9  --   PLT 176  --  212  --  277  --   HEPARINUNFRC 0.25*   < > 0.48 0.60 0.71* 0.81*  CREATININE 2.12*  --  1.95*  --  1.79*  --    < > = values in this interval not displayed.     Estimated Creatinine Clearance: 58.4 mL/min (A) (by C-G formula based on SCr of 1.79 mg/dL (H)).   Medications:  Scheduled:   amLODipine  5 mg Oral Daily   aspirin EC  81 mg Oral Daily   atorvastatin  80 mg Oral Daily   furosemide  80 mg Intravenous BID   hydrALAZINE  50 mg Oral Q8H   insulin aspart  0-5 Units Subcutaneous QHS   insulin aspart  0-9 Units Subcutaneous TID WC   insulin aspart  5 Units Subcutaneous TID WC   insulin glargine-yfgn  15 Units Subcutaneous BID   isosorbide mononitrate  30 mg Oral Daily   metoprolol succinate  50 mg Oral Daily   Infusions:  heparin 2,500 Units/hr (01/22/23 0901)    Assessment: 52 yo male originally on heparin for chest pain. Now with new LLE DVT.    Heparin level came back at 0.81 supratherapeutic after rate decrease to 2500 units/hr. Hgb 14.1, PLT 277 wnl. No s/sx bleeding per RN, but upon further discussion with patient, he remembers the IV pump beeping overnight. When heparin rate was adjusted 4/21 AM, RN switched from RAC to L forearm access.   Goal of Therapy:  Heparin level 0.3-0.7 units/ml Monitor platelets by anticoagulation protocol: Yes   Plan:   Decrease heparin to 2300 units/hr Check 6 hour heparin level Daily heparin level and CBC while on heparin Follow-up plans for oral anticoagulation pending need for cardiac cath Monitor for s/sx bleeding   Larena Sox, PharmD PGY1 Pharmacy Resident   01/22/2023  2:55 PM

## 2023-01-22 NOTE — Plan of Care (Signed)

## 2023-01-22 NOTE — Progress Notes (Signed)
ANTICOAGULATION CONSULT NOTE - Follow Up Consult  Pharmacy Consult for Heparin Indication: chest pain/ACS; new LLE DVT  No Known Allergies  Patient Measurements: Height:  (172.7 cm) Weight: 104.2 kg (229 lb 11.5 oz) IBW/kg (Calculated) : 68.4 Heparin Dosing Weight: 91.1 kg  Vital Signs: Temp: 98.3 F (36.8 C) (04/21 1947) Temp Source: Oral (04/21 1947) BP: 150/90 (04/21 1947) Pulse Rate: 88 (04/21 1947)  Labs: Recent Labs    01/20/23 0057 01/20/23 1811 01/21/23 0225 01/21/23 0952 01/22/23 0106 01/22/23 1411 01/22/23 2138  HGB 11.3*  --  12.5*  --  14.1  --   --   HCT 32.8*  --  36.1*  --  42.9  --   --   PLT 176  --  212  --  277  --   --   HEPARINUNFRC 0.25*   < > 0.48   < > 0.71* 0.81* 0.51  CREATININE 2.12*  --  1.95*  --  1.79*  --   --    < > = values in this interval not displayed.     Estimated Creatinine Clearance: 58.4 mL/min (A) (by C-G formula based on SCr of 1.79 mg/dL (H)).   Medications:  Scheduled:   amLODipine  5 mg Oral Daily   aspirin EC  81 mg Oral Daily   atorvastatin  80 mg Oral Daily   furosemide  80 mg Intravenous BID   hydrALAZINE  50 mg Oral Q8H   insulin aspart  0-5 Units Subcutaneous QHS   insulin aspart  0-9 Units Subcutaneous TID WC   insulin aspart  5 Units Subcutaneous TID WC   insulin glargine-yfgn  15 Units Subcutaneous BID   isosorbide mononitrate  30 mg Oral Daily   metoprolol succinate  50 mg Oral Daily   Infusions:  heparin 2,300 Units/hr (01/22/23 1748)    Assessment: 52 yo male originally on heparin for chest pain. Now with new LLE DVT.    Heparin level now therapeutic (0.51) on infusion at 2300 units/hr. No bleeding noted.  Goal of Therapy:  Heparin level 0.3-0.7 units/ml Monitor platelets by anticoagulation protocol: Yes   Plan:  Continue heparin at 2300 units/hr Daily heparin level and CBC while on heparin Follow-up plans for oral anticoagulation pending need for cardiac cath Monitor for s/sx  bleeding   Christoper Fabian, PharmD, BCPS Please see amion for complete clinical pharmacist phone list 01/22/2023  10:31 PM

## 2023-01-22 NOTE — Plan of Care (Addendum)
Pt continue on Hep gtt, no s/s bleeding. Unna boots changed yesterday. Bilateral LEE. Calls for assistance as needed. Pain free and doing well. No concerns at this time.   Problem: Education: Goal: Ability to describe self-care measures that may prevent or decrease complications (Diabetes Survival Skills Education) will improve Outcome: Progressing Goal: Individualized Educational Video(s) Outcome: Progressing   Problem: Coping: Goal: Ability to adjust to condition or change in health will improve Outcome: Progressing   Problem: Fluid Volume: Goal: Ability to maintain a balanced intake and output will improve Outcome: Progressing   Problem: Health Behavior/Discharge Planning: Goal: Ability to identify and utilize available resources and services will improve Outcome: Progressing Goal: Ability to manage health-related needs will improve Outcome: Progressing   Problem: Metabolic: Goal: Ability to maintain appropriate glucose levels will improve Outcome: Progressing   Problem: Nutritional: Goal: Maintenance of adequate nutrition will improve Outcome: Progressing Goal: Progress toward achieving an optimal weight will improve Outcome: Progressing   Problem: Skin Integrity: Goal: Risk for impaired skin integrity will decrease Outcome: Progressing   Problem: Tissue Perfusion: Goal: Adequacy of tissue perfusion will improve Outcome: Progressing   Problem: Education: Goal: Knowledge of General Education information will improve Description: Including pain rating scale, medication(s)/side effects and non-pharmacologic comfort measures Outcome: Progressing   Problem: Health Behavior/Discharge Planning: Goal: Ability to manage health-related needs will improve Outcome: Progressing   Problem: Clinical Measurements: Goal: Ability to maintain clinical measurements within normal limits will improve Outcome: Progressing Goal: Will remain free from infection Outcome:  Progressing Goal: Diagnostic test results will improve Outcome: Progressing Goal: Respiratory complications will improve Outcome: Progressing Goal: Cardiovascular complication will be avoided Outcome: Progressing   Problem: Activity: Goal: Risk for activity intolerance will decrease Outcome: Progressing   Problem: Nutrition: Goal: Adequate nutrition will be maintained Outcome: Progressing   Problem: Coping: Goal: Level of anxiety will decrease Outcome: Progressing   Problem: Elimination: Goal: Will not experience complications related to bowel motility Outcome: Progressing Goal: Will not experience complications related to urinary retention Outcome: Progressing   Problem: Pain Managment: Goal: General experience of comfort will improve Outcome: Progressing   Problem: Safety: Goal: Ability to remain free from injury will improve Outcome: Progressing   Problem: Skin Integrity: Goal: Risk for impaired skin integrity will decrease Outcome: Progressing

## 2023-01-22 NOTE — Progress Notes (Signed)
Progress Note   Patient: Leroy Simmons GNF:[id number] DOB: [birth year] DOA: 01/18/2023     4 DOS: the patient was seen and examined on 01/22/2023   Brief hospital course: Leroy Simmons was admitted to the hospital with the working diagnosis of heart failure exacerbation.   52 yo male with the past medical history of heart failure, coronary artery disease. T2DM and asthma who presented with worsening lower extremity edema. Worsening symptoms for one week, with chest tightness, progressive edema, orthopnea and PND. On his initial physical examination his blood pressure was 148/95, HR 84, RR 17 and 02 saturation 99%, lungs with no wheezing or rales, heart with S1 and S2 present and rhythmic, abdomen with no distention and positive lower extremity edema.   Na 137, K 4,3 Cl 101, bicarbonate 23, glucose 489, bun 33 cr 1,71  AST 63, ALT 49  BNP 141  High sensitive troponin 329 and 376  Wbc 7,8 hgb 13.6 plt 238   Chest radiograph with cardiomegaly with bilateral hilar vascular congestion with bilateral interstitial infiltrates, cephalization of the vasculature.  EKG 96 bpm, normal axis, normal intervals, sinus rhythm with no significant ST segment changes, negative T wave lead I and AvL. (Not new changes).   Patient was placed on IV furosemide for diuresis and heparin drip for anticoagulation.   Limited echocardiogram with no wall motion abnormalities and preserved LV systolic function.   04/20 continue volume overloaded.   04/21: Patient demonstrated improvement in his breathing, but is still with signs of fluid overload on examination.  Following cardiology recommendations we will continue IV diuresis.  Continue close monitoring of patient renal function and electrolytes.  No final decision made regarding catheterization, continue heparin drip.   Assessment and Plan: * Acute on chronic diastolic CHF (congestive heart failure) -Follow up limited echocardiogram with preserved LV systolic function  with EF 60 to 65%, RV with preserved systolic function, RVSP 49.3 , no atrial dilatation, no pericardial effusion. No significant valvular disease.  -Troponin elevation due to demand ischemia from CHF exacerbation. (NSTEMI rule out).  -continue IV diuresis -continue to follow cardiology service regarding GMDT. -Continue daily weights, strict I's and O's and low-sodium diet. -currently on b-blocker and using hydralazine/isosorbide for afterload reduction. -Once GFR continue improving SGLT2 can be added.   Essential hypertension -Stable labetalol -Continue current antihypertensive agent -Low-sodium diet discussed with patient.    Acute kidney injury superimposed on CKD -Baseline serum cr 1,5 to 2,0 (old records personally reviewed). -CKD stage 3b, hyponatremia, hypokalemia.  -Cr has improved with diuresis and currently 1.79 -continue close monitoring -follow electrolytes trend and further replete as needed. -Patient advised to maintain adequate oral hydration.   Type 2 diabetes mellitus with hyperlipidemia -Uncontrolled hyperglycemia.  -A1C 11 -continue SSI, novolog TID for meal coverage and the use of semglee -modified carb diet discussed with patient. -Continue with statin therapy.   Acute deep vein thrombosis (DVT) of left peroneal vein -Left acute deep vein thrombosis involving the left peroneal veins. (Not right) -Continue anticoagulation with heparin drip, transition to oral anticoagulation if no catheterization planned.   Obesity (BMI 30-39.9) -Body mass index is 34.93 kg/m. -Low-calorie diet, portion control and increase physical activity discussed with patient.     Subjective: No chest pain, no nausea, no vomiting.  Overall reporting breathing better and easier.  Still with signs of fluid overload exam.  Reporting good urine output.  Physical Exam: Vitals:   01/22/23 0745 01/22/23 1309 01/22/23 1311 01/22/23 1511  BP: (!) 115/55 Marland Kitchen)  140/89 (!) 140/89 136/79   Pulse: 85  81 85  Resp: Temp: 97.9 F (36.6 C) 98.2 F (36.8 C)  98.4 F (36.9 C)  TempSrc: Oral Oral  Oral  SpO2: 98%  99% 94%  Weight:      Height:       General exam: Alert, awake, oriented x 3; reports good night sleep and overall feeling and breathing better. Respiratory system: Decreased breath sounds at the bases; no wheezing.  No using accessory muscle.  Good saturation on room air. Cardiovascular system:RRR. No rubs or gallops; no JVD. Gastrointestinal system: Abdomen is obese, nondistended, soft and nontender. No organomegaly or masses felt. Normal bowel sounds heard. Central nervous system: Alert and oriented. No focal neurological deficits. Extremities: No cyanosis or clubbing; 2-3 plus edema bilaterally.  Unna boots wraps appreciated bilaterally. Skin: No petechiae. Psychiatry: Judgement and insight appear normal. Mood & affect appropriate.   Data Reviewed: Basic metabolic panel: Sodium 136, potassium 3.8, chloride 99, bicarb 27, BUN 45, creatinine 1.79 and GFR more than 46. CBC: WBC 6.8, hemoglobin 14.1 and platelet count 277K   Family Communication: no family at the bedside   Disposition: Status is: Inpatient Remains inpatient appropriate because: heart failure   Planned Discharge Destination: Home   Author: Vassie Loll, MD 01/22/2023 5:25 PM  For on call review [email or web address].

## 2023-01-23 ENCOUNTER — Other Ambulatory Visit (HOSPITAL_COMMUNITY): Payer: Self-pay

## 2023-01-23 DIAGNOSIS — I10 Essential (primary) hypertension: Secondary | ICD-10-CM | POA: Diagnosis not present

## 2023-01-23 DIAGNOSIS — N179 Acute kidney failure, unspecified: Secondary | ICD-10-CM | POA: Diagnosis not present

## 2023-01-23 DIAGNOSIS — I5033 Acute on chronic diastolic (congestive) heart failure: Secondary | ICD-10-CM | POA: Diagnosis not present

## 2023-01-23 DIAGNOSIS — I82452 Acute embolism and thrombosis of left peroneal vein: Secondary | ICD-10-CM | POA: Diagnosis not present

## 2023-01-23 DIAGNOSIS — E1169 Type 2 diabetes mellitus with other specified complication: Secondary | ICD-10-CM | POA: Diagnosis not present

## 2023-01-23 LAB — BASIC METABOLIC PANEL
Anion gap: 11 (ref 5–15)
BUN: 46 mg/dL — ABNORMAL HIGH (ref 6–20)
CO2: 28 mmol/L (ref 22–32)
Calcium: 8.7 mg/dL — ABNORMAL LOW (ref 8.9–10.3)
Chloride: 98 mmol/L (ref 98–111)
Creatinine, Ser: 1.78 mg/dL — ABNORMAL HIGH (ref 0.61–1.24)
GFR, Estimated: 46 mL/min — ABNORMAL LOW (ref >60)
Glucose, Bld: 202 mg/dL — ABNORMAL HIGH (ref 70–99)
Potassium: 3.5 mmol/L (ref 3.5–5.1)
Sodium: 137 mmol/L (ref 135–145)

## 2023-01-23 LAB — GLUCOSE, CAPILLARY
Glucose-Capillary: 185 mg/dL — ABNORMAL HIGH (ref 70–99)
Glucose-Capillary: 235 mg/dL — ABNORMAL HIGH (ref 70–99)
Glucose-Capillary: 222 mg/dL — ABNORMAL HIGH (ref 70–99)
Glucose-Capillary: 164 mg/dL — ABNORMAL HIGH (ref 70–99)

## 2023-01-23 LAB — CBC
HCT: 39 % (ref 39.0–52.0)
Hemoglobin: 12.6 g/dL — ABNORMAL LOW (ref 13.0–17.0)
MCH: 29.4 pg (ref 26.0–34.0)
MCHC: 32.3 g/dL (ref 30.0–36.0)
MCV: 91.1 fL (ref 80.0–100.0)
Platelets: 262 10*3/uL (ref 150–400)
RBC: 4.28 MIL/uL (ref 4.22–5.81)
RDW: 12.3 % (ref 11.5–15.5)
WBC: 6.8 10*3/uL (ref 4.0–10.5)
nRBC: 0 % (ref 0.0–0.2)

## 2023-01-23 LAB — HEPARIN LEVEL (UNFRACTIONATED): Heparin Unfractionated: 0.52 IU/mL (ref 0.30–0.70)

## 2023-01-23 MED ORDER — APIXABAN 5 MG PO TABS
10.0000 mg | ORAL_TABLET | Freq: Two times a day (BID) | ORAL | Status: DC
  Administered 2023-01-23 – 2023-01-24 (×3): 10 mg via ORAL
  Filled 2023-01-23 (×3): qty 2

## 2023-01-23 MED ORDER — POTASSIUM CHLORIDE CRYS ER 20 MEQ PO TBCR
40.0000 meq | EXTENDED_RELEASE_TABLET | Freq: Once | ORAL | Status: AC
  Administered 2023-01-23: 40 meq via ORAL
  Filled 2023-01-23: qty 2

## 2023-01-23 MED ORDER — APIXABAN 5 MG PO TABS: 5.0000 mg | ORAL_TABLET | Freq: Two times a day (BID) | ORAL | Status: DC

## 2023-01-23 MED ORDER — INSULIN GLARGINE-YFGN 100 UNIT/ML ~~LOC~~ SOLN
18.0000 [IU] | Freq: Two times a day (BID) | SUBCUTANEOUS | Status: DC
  Administered 2023-01-23 – 2023-01-24 (×2): 18 [IU] via SUBCUTANEOUS
  Filled 2023-01-23 (×3): qty 0.18

## 2023-01-23 NOTE — Progress Notes (Addendum)
s  Heart Failure Stewardship Pharmacist Progress Note   PCP: [place], Buren Kos, MD PCP-Cardiologist: Parke Poisson, MD    HPI:  52 yo M with PMH of HTN, HLD, CHF, CAD, anxiety, T2DM, pulmonary nodules, and asthma.  Presented to the ED on 4/17 with shortness of breath, LE edema, orthopnea, and chest pressure. Reports that his lasix wasn't working as well any more to remove fluid. CXR concerning for mild CHF. ECHO 4/18 with LVEF 60-65% (was 55-60% in 03/2022 and 09/2022), no regional wall motion abnormalities, RV normal. Also with new acute DVT.  Current HF Medications: Diuretic: furosemide 80 mg IV BID Beta Blocker: metoprolol XL 50 mg daily Other: hydralazine 50 mg TID + Imdur 30 mg daily  Prior to admission HF Medications: Diuretic: furosemide 40 mg daily Beta blocker: metoprolol XL 50 mg daily - no recent fill history for this ACE/ARB/ARNI: lisinopril 20 mg daily SGLT2i: Jardiance 10 mg daily - he states he receives this for free through the health dept / [facility]   Pertinent Lab Values: Serum creatinine 1.78, BUN 46, Potassium 3.5, Sodium 137, BNP 141.8, Magnesium 1.8, A1c 11  Vital Signs: Weight: 222 lbs (admission weight 237 lbs) Blood pressure: 160/80s Heart rate: 80-90s  I/O: -3.7L yesterday; net -9.3L  Medication Assistance / Insurance Benefits Check: Does the patient have prescription insurance?  Yes - insurance started on 4/1 Type of insurance plan: Hospital doctor  Outpatient Pharmacy:  Prior to admission outpatient pharmacy: [facility] Is the patient willing to use [facility] TOC pharmacy at discharge? Yes Is the patient willing to transition their outpatient pharmacy to utilize a [facility] outpatient pharmacy?   Yes - transitioned to [facility] OP    Assessment: 1. Acute on chronic diastolic CHF (LVEF 60-65%). NYHA class III symptoms. - Continue furosemide 80 mg IV BID. Strict I/Os and daily weights. Keep K>4 and Mg>2. - Continue metoprolol XL 50 mg daily -  Continue hydralazine 50 mg TID + Imdur 30 mg daily - Holding ARB/ARNI with AKI, creatinine stable from yesterday  - Holding Jardiance with elevated A1c  Plan: 1) Medication changes recommended at this time: - Continue IV diuresis   2) Patient assistance: - $4,495 deductible remaining on insurance - Entresto copay $656.56 until deductible met - copay card will lower this to $10 per month now - Jardiance requires prior authorization, can complete if this is restarted - Previously received [place] through health dept / [place]. He is unsure if he still qualifies for this now that he has insurance - Marcelline Deist not available on formulary  3)  Education  - Patient has been educated on current HF medications and potential additions to HF medication regimen - Patient verbalizes understanding that over the next few months, these medication doses may change and more medications may be added to optimize HF regimen - Patient has been educated on basic disease state pathophysiology and goals of therapy   Sharen Hones, PharmD, BCPS Heart Failure Engineer, building services Phone [phone]

## 2023-01-23 NOTE — Assessment & Plan Note (Addendum)
Continue blood pressure control with metoprolol, hydralazine and isosorbide. Continue diuresis with torsemide.

## 2023-01-23 NOTE — Progress Notes (Signed)
Rounding Note    Patient Name: Leroy Simmons Date of Encounter: 01/23/2023  [facility] Cardiologist: Parke Poisson, MD   Subjective   No acute events overnight. Sleeping comfortably, no chest pain. Breathing and swelling continue to improve.  Inpatient Medications    Scheduled Meds:  amLODipine  5 mg Oral Daily   aspirin EC  81 mg Oral Daily   atorvastatin  80 mg Oral Daily   furosemide  80 mg Intravenous BID   hydrALAZINE  50 mg Oral Q8H   insulin aspart  0-5 Units Subcutaneous QHS   insulin aspart  0-9 Units Subcutaneous TID WC   insulin aspart  5 Units Subcutaneous TID WC   insulin glargine-yfgn  18 Units Subcutaneous BID   isosorbide mononitrate  30 mg Oral Daily   metoprolol succinate  50 mg Oral Daily   potassium chloride  40 mEq Oral Once   Continuous Infusions:  heparin 2,300 Units/hr (01/23/23 0459)   PRN Meds: acetaminophen **OR** acetaminophen, albuterol, bisacodyl, mouth rinse   Vital Signs    Vitals:   01/23/23 0356 01/23/23 0357 01/23/23 0730 01/23/23 1109  BP: (!) 152/93  (!) 161/86 (!) 151/84  Pulse: 93  88 91  Resp: Temp: 98.3 F (36.8 C)  98 F (36.7 C) 97.8 F (36.6 C)  TempSrc: Oral     SpO2: 97%  100% (!) 89%  Weight:  100.9 kg    Height:        Intake/Output Summary (Last 24 hours) at 01/23/2023 1308 Last data filed at 01/23/2023 0830 Gross per 24 hour  Intake 1719.01 ml  Output 4200 ml  Net -2480.99 ml      01/23/2023    3:57 AM 01/22/2023    5:05 AM 01/21/2023    4:32 AM  Last 3 Weights  Weight (lbs) 222 lb 6.4 oz 229 lb 11.5 oz 235 lb 7.2 oz  Weight (kg) 100.88 kg 104.2 kg 106.8 kg      Telemetry    NSR with brief SVT- Personally Reviewed  ECG    4/19 NSR at 88 bpm - Personally Reviewed  Physical Exam   GEN: Well nourished, well developed in no acute distress NECK: No JVD appreciated CARDIAC: regular rhythm, normal S1 and S2, no rubs or gallops. No murmur. VASCULAR: Radial pulses 2+  bilaterally.  RESPIRATORY:  Clear to auscultation without rales, wheezing or rhonchi  ABDOMEN: Soft, non-tender, non-distended MUSCULOSKELETAL:  Moves all 4 limbs independently SKIN: Warm and dry, bilateral LE wrapped, edema improving NEUROLOGIC:  No focal neuro deficits noted. PSYCHIATRIC:  Normal affect    Labs    High Sensitivity Troponin:   Recent Labs  Lab 01/18/23 0641 01/18/23 0851 01/19/23 0617  TROPONINIHS 329* 376* 195*     Chemistry Recent Labs  Lab 01/18/23 0641 01/19/23 0214 01/19/23 0617 01/20/23 0057 01/21/23 0225 01/22/23 0106 01/23/23 0055  NA 132* 132* 134*   < > 133* 136 137  K 4.3 3.9 4.0   < > 3.1* 3.8 3.5  CL 101 100 98   < > 100 99 98  CO2 < > GLUCOSE 489* 353* 346*   < > 176* 202* 202*  BUN 33* 36* 36*   < > 42* 45* 46*  CREATININE 1.71* 1.86* 1.97*   < > 1.95* 1.79* 1.78*  CALCIUM 8.3* 8.0* 8.2*   < > 8.1* 8.8* 8.7*  MG  --  1.8  --   --   --   --   --   PROT 6.1*  --  5.7*  --   --   --   --   ALBUMIN 2.5*  --  2.4*  --   --   --   --   AST 63*  --  24  --   --   --   --   ALT 49*  --  35  --   --   --   --   ALKPHOS 140*  --  128*  --   --   --   --   BILITOT 0.6  --  0.8  --   --   --   --   GFRNONAA 48* 44* 41*   < > 41* 46* 46*  ANIONGAP 8 7 11    < > 7 10 11    < > = values in this interval not displayed.    Lipids  Recent Labs  Lab 01/19/23 0617  CHOL 218*  TRIG 228*  HDL 47  LDLCALC 125*  CHOLHDL 4.6    Hematology Recent Labs  Lab 01/21/23 0225 01/22/23 0106 01/23/23 0055  WBC 6.9 6.8 6.8  RBC 4.16* 4.76 4.28  HGB 12.5* 14.1 12.6*  HCT 36.1* 42.9 39.0  MCV 86.8 90.1 91.1  MCH 30.0 29.6 29.4  MCHC 34.6 32.9 32.3  RDW 12.3 12.3 12.3  PLT 212 277 262   Thyroid No results for input(s): "TSH", "FREET4" in the last 168 hours.  BNP Recent Labs  Lab 01/18/23 0641  BNP 141.8*    DDimer No results for input(s): "DDIMER" in the last 168 hours.   Radiology    No results found.  Cardiac  Studies   Echo 09/27/22:  1. Left ventricular ejection fraction, by estimation, is 55 to 60%. The left ventricle has normal function. The left ventricle has no regional wall motion abnormalities. There is mild left ventricular hypertrophy. Left ventricular diastolic parameters are consistent with Grade II diastolic dysfunction (pseudonormalization).  2. Right ventricular systolic function is normal. The right ventricular size is normal. Tricuspid regurgitation signal is inadequate for assessing PA pressure.  3. Left atrial size was mildly dilated.  4. The mitral valve is normal in structure. Trivial mitral valve regurgitation. No evidence of mitral stenosis.  5. The aortic valve is normal in structure. Aortic valve regurgitation is not visualized. Aortic valve sclerosis/calcification is present, without any evidence of aortic stenosis.  6. The inferior vena cava is dilated in size with >50% respiratory variability, suggesting right atrial pressure of 8 mmHg.   Echo 01/19/23:  1. Limited echo   2. Left ventricular ejection fraction, by estimation, is 60 to 65%. Left  ventricular ejection fraction by PLAX is 62 %. The left ventricle has  normal function. The left ventricle has no regional wall motion  abnormalities.   3. Right ventricular systolic function is normal. The right ventricular  size is normal. There is moderately elevated pulmonary artery systolic  pressure. The estimated right ventricular systolic pressure is 49.3 mmHg.   4. The aortic valve is tricuspid. Aortic valve regurgitation is not  visualized.   5. The inferior vena cava is dilated in size with <50% respiratory  variability, suggesting right atrial pressure of 15 mmHg.    LE Doppler 01/19/23: Summary:  BILATERAL:  -No evidence of popliteal cyst, bilaterally.  -Diffuse subcutaneous edema, bilaterally.  RIGHT:  - There is no evidence of deep vein thrombosis  in the lower extremity.    LEFT:  - Findings  consistent with acute deep vein thrombosis involving the left  peroneal veins.   Patient Profile     52 y.o. male with HFpEF, non-obstructive CAD, hypertension, hyperlipidemia, anxiety, depression, and asthma admitted with acute on chronic HFpEF   Assessment & Plan    Acute on chronic HFpEF Hypertension Acute kidney injury on chronic kidney disease stage 3b -Echo showed normal systolic function, normal RV function, elevated RVSP and right atrial pressure 15 mmHg. -continue diuresis -continue hydralazine, imdur. No ACEi/ARB/ARNI/MRA given AKI. Also on amlodipine. -continue diuresis -weights do not agree. Admission weight 104.3 kg, but weight 4/19 was 107.7 kg. Weight today 100.9 kg. Charted net negative 9.3 L. -Cr today 1.78, improved from 2.12 on 4/19 -K 3.5, would replete   Nonobstructive CAD: Atypical chest pain: Demand ischemia: Hyperlipidemia: -High-sensitivity troponin was elevated to 376 and is down trending. Likely demand ischemia -no further chest pain. OK to transition from heparin to DOAC for DVT -left heart cath 03/2022 that revealed 50% D2, 40% mid LAD, and 25% ramus lesions  -Continue metoprolol, Imdur, and atorvastatin.   DVT:  -L peroneal vein acute DVT.    Type II diabetes -A1c 11% -on insulin -as blood sugars improve, consider restarting SLGT2i  For questions or updates, please contact [facility] Please consult [email or web address] for contact info under    Signed, Jodelle Red, MD  01/23/2023, 1:08 PM

## 2023-01-23 NOTE — Inpatient Diabetes Management (Signed)
Inpatient Diabetes Program Recommendations  AACE/ADA: New Consensus Statement on Inpatient Glycemic Control  Target Ranges:  Prepandial:   less than 140 mg/dL      Peak postprandial:   less than 180 mg/dL (1-2 hours)      Critically ill patients:  140 - 180 mg/dL    Latest Reference Range & Units 01/22/23 05:43 01/22/23 10:54 01/22/23 16:15 01/22/23 21:22 01/23/23 06:13  Glucose-Capillary 70 - 99 mg/dL 161 (H) 096 (H) 045 (H) 229 (H) 185 (H)    Review of Glycemic Control  Diabetes history: DM2 Outpatient Diabetes medications: Lantus 26 units QAM, Jardiance 10 mg QAM, Metformin XR 2000 mg QAM Current orders for Inpatient glycemic control:  Semglee 15 units BID Novolog 0-9 units TID + hs Novolog 3 units TID with meals  Inpatient Diabetes Program Recommendations:    -   Increase Novolog meal coverage to 7 units tid if eating >50 % of meals   Outpatient DM: Patient reports he has been out of Jardiance for a few weeks and he needs a refill. Please provide Rx for Jardiance at discharge if it is continued as outpatient at time of discharge.  Thanks,  Christena Deem RN, MSN, BC-ADM Inpatient Diabetes Coordinator Team Pager [phone] (8a-5p)

## 2023-01-23 NOTE — Progress Notes (Signed)
Progress Note   Patient: Leroy Simmons:[id number] DOB: 1971/01/14 DOA: 01/18/2023     5 DOS: the patient was seen and examined on 01/23/2023   Brief hospital course: Leroy Simmons was admitted to the hospital with the working diagnosis of heart failure exacerbation.   52 yo male with the past medical history of heart failure, coronary artery disease. T2DM and asthma who presented with worsening lower extremity edema. Worsening symptoms for one week, with chest tightness, progressive edema, orthopnea and PND. On his initial physical examination his blood pressure was 148/95, HR 84, RR 17 and 02 saturation 99%, lungs with no wheezing or rales, heart with S1 and S2 present and rhythmic, abdomen with no distention and positive lower extremity edema.   Na 137, K 4,3 Cl 101, bicarbonate 23, glucose 489, bun 33 cr 1,71  AST 63, ALT 49  BNP 141  High sensitive troponin 329 and 376  Wbc 7,8 hgb 13.6 plt 238   Chest radiograph with cardiomegaly with bilateral hilar vascular congestion with bilateral interstitial infiltrates, cephalization of the vasculature.  EKG 96 bpm, normal axis, normal intervals, sinus rhythm with no significant ST segment changes, negative T wave lead I and AvL. (Not new changes).   Patient was placed on IV furosemide for diuresis and heparin drip for anticoagulation.   Limited echocardiogram with no wall motion abnormalities and preserved LV systolic function.   04/20 continue volume overloaded.   04/21: Patient demonstrated improvement in his breathing, but is still with signs of fluid overload on examination.  Following cardiology recommendations we will continue IV diuresis.  Continue close monitoring of patient renal function and electrolytes.  No final decision made regarding catheterization, continue heparin drip. 04/22 patient continue to respond to diuresis, changed from heparin to enoxaparin for anticoagulation.   Assessment and Plan: * Acute on chronic diastolic  CHF (congestive heart failure) Echocardiogram with preserved LV systolic function with EF 60 to 65%, RV with preserved systolic function, RVSP 49.3 , no atrial dilatation, no pericardial effusion. No significant valvular disease.   Troponin elevation due to demand ischemia from CHF exacerbation. (NSTEMI rule out).   Urine output 5,750 ml Systolic blood pressure 150 to 160 mmHg.   Plan to continue diuresis with furosemide IV 80 mg q12  hydralazine/isosorbide for afterload reduction. Continue metoprolol.  Blood pressure control with amlodipine.     Essential hypertension Continue blood pressure control with metoprolol, hydralazine and isosorbide. Aggressive diuresis with IV furosemide.   Acute kidney injury superimposed on CKD -Baseline serum cr 1,5 to 2,0 (old records personally reviewed). -CKD stage 3b, hyponatremia, hypokalemia.   Volume status is improving.  Renal function with serum cr at 1,78 with K at 3,5 and serum bicarbonate at 28. Plan to continue diuresis and follow up renal function in am.  Check Mg   Type 2 diabetes mellitus with hyperlipidemia -Uncontrolled hyperglycemia.  -A1C 11  Fasting glucose 117. Plan to continue insulin sliding scale and pre meal insulin 5 units  Increase basal insulin to 18 units bid  Continue with statin therapy.  Acute deep vein thrombosis (DVT) of left peroneal vein -Left acute deep vein thrombosis involving the left peroneal veins. (Not right)  Transition to oral anticoagulation with apixaban.  Discontinue heparin drip.    Obesity (BMI 30-39.9) -Body mass index is 34.93 kg/m. -Low-calorie diet, portion control and increase physical activity discussed with patient.          Subjective: Patient with improvement in edema, no chest pain,  Physical Exam: Vitals:   01/23/23 0356 01/23/23 0357 01/23/23 0730 01/23/23 1109  BP: (!) 152/93  (!) 161/86 (!) 151/84  Pulse: 93  88 91  Resp: Temp: 98.3 F (36.8 C)   98 F (36.7 C) 97.8 F (36.6 C)  TempSrc: Oral     SpO2: 97%  100% (!) 89%  Weight:  100.9 kg    Height:       Neurology awake and alert ENT with mild pallor Cardiovascular with S1 and S2 present and rhythmic with no gallops, rubs or murmurs No JVD Positive lower extremity edema ++ unna boots in place Respiratory with mild rales at bases with no wheezing or rhonchi Abdomen with no distention  Data Reviewed:    Family Communication: I spoke with patient's family at the bedside, we talked in detail about patient's condition, plan of care and prognosis and all questions were addressed.   Disposition: Status is: Inpatient Remains inpatient appropriate because: IV diuresis   Planned Discharge Destination: Home      Author: Coralie Keens, MD 01/23/2023 1:26 PM  For on call review [email or web address].

## 2023-01-23 NOTE — Progress Notes (Signed)
Physical Therapy Treatment Patient Details Name: Leroy Simmons MRN: [id number] DOB: 11-10-1970 Today's Date: 01/23/2023   History of Present Illness Pt admitted with the working diagnosis of heart failure exacerbation, acute kidney injury superimposed on CKD and DVT left peroneal vein - heparin therapeutic.  PMH of heart failure, coronary artery disease. T2DM and asthma who presented with worsening lower extremity edema.    PT Comments    Pt amb without assistance. Close to his baseline. No further PT needed.    Recommendations for follow up therapy are one component of a multi-disciplinary discharge planning process, led by the attending physician.  Recommendations may be updated based on patient status, additional functional criteria and insurance authorization.  Follow Up Recommendations       Assistance Recommended at Discharge PRN  Patient can return home with the following     Equipment Recommendations  None recommended by PT    Recommendations for Other Services       Precautions / Restrictions Precautions Precautions: None     Mobility  Bed Mobility Overal bed mobility: Independent                  Transfers Overall transfer level: Modified independent Equipment used: None Transfers: Sit to/from Stand Sit to Stand: Modified independent (Device/Increase time)                Ambulation/Gait Ambulation/Gait assistance: Modified independent (Device/Increase time) Gait Distance (Feet): 300 Feet Assistive device: None Gait Pattern/deviations: Step-through pattern Gait velocity: decreased Gait velocity interpretation: 1.31 - 2.62 ft/sec, indicative of limited community ambulator   General Gait Details: steady Development worker, community    Modified Rankin (Stroke Patients Only)       Balance Overall balance assessment: Needs assistance Sitting-balance support: No upper extremity supported, Feet supported Sitting  balance-Leahy Scale: Normal     Standing balance support: No upper extremity supported, During functional activity Standing balance-Leahy Scale: Good                              Cognition Arousal/Alertness: Awake/alert Behavior During Therapy: WFL for tasks assessed/performed Overall Cognitive Status: Within Functional Limits for tasks assessed                                          Exercises      General Comments        Pertinent Vitals/Pain Pain Assessment Pain Assessment: No/denies pain    Home Living                          Prior Function            PT Goals (current goals can now be found in the care plan section) Progress towards PT goals: Goals met/education completed, patient discharged from PT    Frequency           PT Plan Current plan remains appropriate    Co-evaluation              AM-PAC PT "6 Clicks" Mobility   Outcome Measure  Help needed turning from your back to your side while in a flat bed without using bedrails?: None Help needed moving from lying on your back to  sitting on the side of a flat bed without using bedrails?: None Help needed moving to and from a bed to a chair (including a wheelchair)?: None Help needed standing up from a chair using your arms (e.g., wheelchair or bedside chair)?: None Help needed to walk in hospital room?: None Help needed climbing 3-5 steps with a railing? : A Little 6 Click Score: 23    End of Session   Activity Tolerance: Patient tolerated treatment well Patient left: in bed;with call bell/phone within reach (sitting EOB)         Time: 8413-2440 PT Time Calculation (min) (ACUTE ONLY): 10 min  Charges:  $Gait Training: 8-22 mins                     [facility] PT Acute Rehabilitation Services Office [phone]    Angelina Ok [facility] 01/23/2023, 3:12 PM

## 2023-01-23 NOTE — Progress Notes (Signed)
ANTICOAGULATION CONSULT NOTE - Follow Up Consult  Pharmacy Consult for Heparin Indication: chest pain/ACS; new LLE DVT  No Known Allergies  Patient Measurements: Height:  (172.7 cm) Weight: 100.9 kg (222 lb 6.4 oz) IBW/kg (Calculated) : 68.4 Heparin Dosing Weight: 91.1 kg  Vital Signs: Temp: 98 F (36.7 C) (04/22 0730) Temp Source: Oral (04/22 0356) BP: 161/86 (04/22 0730) Pulse Rate: 88 (04/22 0730)  Labs: Recent Labs    01/21/23 0225 01/21/23 0952 01/22/23 0106 01/22/23 1411 01/22/23 2138 01/23/23 0055  HGB 12.5*  --  14.1  --   --  12.6*  HCT 36.1*  --  42.9  --   --  39.0  PLT 212  --  277  --   --  262  HEPARINUNFRC 0.48   < > 0.71* 0.81* 0.51 0.52  CREATININE 1.95*  --  1.79*  --   --  1.78*   < > = values in this interval not displayed.     Estimated Creatinine Clearance: 57.8 mL/min (A) (by C-G formula based on SCr of 1.78 mg/dL (H)).     Assessment: 52 yo male started on heparin for chest pain, then found to have new LLE DVT.  No anticoagulation prior to admission. Pharmacy consulted for heparin.     Heparin level 0.52 is therapeutic on 2300 units/hr. CBC stable.  Goal of Therapy:  Heparin level 0.3-0.7 units/ml Monitor platelets by anticoagulation protocol: Yes   Plan:  Continue heparin at 2300 units/hr Monitor daily heparin level, CBC Monitor for signs/symptoms of bleeding  F/u cards recs for cath, switch to DOAC   Alphia Moh, PharmD, BCPS, [facility] Clinical Pharmacist  Please check AMION for all [facility] Pharmacy phone numbers After 10:00 PM, call Main Pharmacy [phone]

## 2023-01-23 NOTE — TOC Initial Note (Signed)
Transition of Care [facility]) - Initial/Assessment Note    Patient Details  Name: Leroy Simmons MRN: [id number] Date of Birth: 1971/02/13  Transition of Care [facility]) CM/SW Contact:    Leone Haven, RN Phone Number: 01/23/2023, 3:26 PM  Clinical Narrative:                 From home , he rents a room from a friend in a house, per pt eval no pt follow up needed.  He states he will need bus pass at dc.  He will be on eliquis, NCM will give him the 30 day free coupon and the 10.00 copay card for eliquis.          Patient Goals and CMS Choice            Expected Discharge Plan and Services                                              Prior Living Arrangements/Services                       Activities of Daily Living Home Assistive Devices/Equipment: None ADL Screening (condition at time of admission) Patient's cognitive ability adequate to safely complete daily activities?: Yes Is the patient deaf or have difficulty hearing?: No Does the patient have difficulty seeing, even when wearing glasses/contacts?: No Does the patient have difficulty concentrating, remembering, or making decisions?: No Patient able to express need for assistance with ADLs?: No Does the patient have difficulty dressing or bathing?: No Independently performs ADLs?: Yes (appropriate for developmental age) Does the patient have difficulty walking or climbing stairs?: Yes Weakness of Legs: Both Weakness of Arms/Hands: None  Permission Sought/Granted                  Emotional Assessment              Admission diagnosis:  NSTEMI (non-ST elevated myocardial infarction) [I21.4] Acute on chronic congestive heart failure, unspecified heart failure type [I50.9] Patient Active Problem List   Diagnosis Date Noted   Acute deep vein thrombosis (DVT) of left peroneal vein 01/20/2023   NSTEMI (non-ST elevated myocardial infarction) 01/18/2023   Anasarca 01/18/2023    Hepatitis 01/18/2023   Status post laparoscopic appendectomy 10/06/2022   Obesity (BMI 30-39.9) 09/26/2022   Pre-operative cardiovascular examination 09/26/2022   Heart failure with preserved ejection fraction 09/26/2022   Hypertensive urgency 09/25/2022   Type 2 diabetes mellitus with hyperlipidemia 09/25/2022   Acute on chronic diastolic CHF (congestive heart failure) 06/02/2022   Pulmonary nodule 06/02/2022   Acute kidney injury superimposed on CKD 03/18/2022   Coronary artery disease involving native coronary artery of native heart without angina pectoris 03/18/2022   Acute exacerbation of CHF (congestive heart failure) 03/18/2022   Cellulitis of right lower extremity    CHF exacerbation 03/17/2022   Anxiety and depression 03/10/2022   Cardiomegaly 01/18/2022   Diabetes mellitus 12/07/2021   Hyperlipidemia 12/07/2021   Pain of both shoulder joints 10/22/2021   Cellulitis of back except buttock 10/01/2020   Asthma 10/26/2019   Cellulitis of left heel 10/26/2019   Chest pain 10/26/2019   Left foot pain 10/26/2019   Cellulitis and abscess of neck 07/13/2019   Abscess of back, except buttock 05/30/2019   HYPOKALEMIA 09/07/2009   Morbid obesity 09/07/2009   Anxiety 09/07/2009  Depression 09/07/2009   Essential hypertension 09/07/2009   Asthma 09/07/2009   DIARRHEA, CHRONIC 09/07/2009   PCP:  de Peru, Raymond J, MD Pharmacy:   [facility] [address] [place] [address] Phone: [phone] Fax: [phone]  [place] [facility] [address] [place] [address] Phone: [phone] Fax: [phone]     Social Determinants of Health (SDOH) Social History: SDOH Screenings   Food Insecurity: No Food Insecurity (01/19/2023)  Housing: Low Risk  (01/20/2023)  Transportation Needs: No Transportation Needs (01/20/2023)  Utilities: Not At Risk (01/19/2023)  Alcohol Screen: Low Risk  (01/20/2023)   Depression (PHQ2-9): Low Risk  (05/24/2022)  Recent Concern: Depression (PHQ2-9) - High Risk (03/10/2022)  Financial Resource Strain: Low Risk  (01/20/2023)  Tobacco Use: Low Risk  (01/20/2023)   SDOH Interventions: Food Insecurity Interventions: Intervention Not Indicated Housing Interventions: Intervention Not Indicated Transportation Interventions: Intervention Not Indicated Utilities Interventions: Intervention Not Indicated Alcohol Usage Interventions: Intervention Not Indicated (Score <7) Financial Strain Interventions: Intervention Not Indicated   Readmission Risk Interventions    01/23/2023    3:20 PM 09/27/2022   12:36 PM  Readmission Risk Prevention Plan  Transportation Screening Complete Complete  PCP or Specialist Appt within 3-5 Days Complete Complete  Palliative Care Screening Not Applicable Not Applicable  Medication Review (RN Care Manager) Complete Complete

## 2023-01-23 NOTE — Progress Notes (Signed)
Orthopedic Tech Progress Note Patient Details:  Leroy Simmons [birth year] [id number]  Ortho Devices Type of Ortho Device: Ace wrap, Unna boot Ortho Device/Splint Location: BLE Ortho Device/Splint Interventions: Ordered, Application, Adjustment   Post Interventions Patient Tolerated: Well Instructions Provided: Care of device  Donald Pore 01/23/2023, 6:49 PM

## 2023-01-23 NOTE — Progress Notes (Signed)
ANTICOAGULATION CONSULT NOTE - Follow Up Consult  Pharmacy Consult for Heparin > Apixaban  Indication: chest pain/ACS; new LLE DVT  No Known Allergies  Patient Measurements: Height:  (172.7 cm) Weight: 100.9 kg (222 lb 6.4 oz) IBW/kg (Calculated) : 68.4 Heparin Dosing Weight: 91.1 kg  Vital Signs: Temp: 97.8 F (36.6 C) (04/22 1109) Temp Source: Oral (04/22 0356) BP: 151/84 (04/22 1109) Pulse Rate: 91 (04/22 1109)  Labs: Recent Labs    01/21/23 0225 01/21/23 0952 01/22/23 0106 01/22/23 1411 01/22/23 2138 01/23/23 0055  HGB 12.5*  --  14.1  --   --  12.6*  HCT 36.1*  --  42.9  --   --  39.0  PLT 212  --  277  --   --  262  HEPARINUNFRC 0.48   < > 0.71* 0.81* 0.51 0.52  CREATININE 1.95*  --  1.79*  --   --  1.78*   < > = values in this interval not displayed.     Estimated Creatinine Clearance: 57.8 mL/min (A) (by C-G formula based on SCr of 1.78 mg/dL (H)).     Assessment: 52 yo male started on heparin for chest pain, then found to have new LLE DVT.  No anticoagulation prior to admission. Pharmacy consulted for heparin.     Heparin level 0.52 is therapeutic on 2300 units/hr. CBC stable.  Goal of Therapy:  Heparin level 0.3-0.7 units/ml Monitor platelets by anticoagulation protocol: Yes   Plan:  Continue heparin at 2300 units/hr Monitor daily heparin level, CBC Monitor for signs/symptoms of bleeding  F/u cards recs for cath, switch to DOAC    ADDENDUM 13:50- consulted to switch to apixaban. Patient was therapeutic on heparin since 4/20. Will give apixaban  BID x10 doses then decrease to  BID.     Alphia Moh, PharmD, BCPS, BCCP Clinical Pharmacist  Please check AMION for all [facility] Pharmacy phone numbers After 10:00 PM, call Main Pharmacy [phone]

## 2023-01-24 ENCOUNTER — Other Ambulatory Visit (HOSPITAL_COMMUNITY): Payer: Self-pay

## 2023-01-24 DIAGNOSIS — E669 Obesity, unspecified: Secondary | ICD-10-CM | POA: Diagnosis not present

## 2023-01-24 DIAGNOSIS — I5033 Acute on chronic diastolic (congestive) heart failure: Secondary | ICD-10-CM | POA: Diagnosis not present

## 2023-01-24 DIAGNOSIS — I10 Essential (primary) hypertension: Secondary | ICD-10-CM | POA: Diagnosis not present

## 2023-01-24 DIAGNOSIS — N179 Acute kidney failure, unspecified: Secondary | ICD-10-CM | POA: Diagnosis not present

## 2023-01-24 DIAGNOSIS — N189 Chronic kidney disease, unspecified: Secondary | ICD-10-CM | POA: Diagnosis not present

## 2023-01-24 DIAGNOSIS — I82452 Acute embolism and thrombosis of left peroneal vein: Secondary | ICD-10-CM | POA: Diagnosis not present

## 2023-01-24 DIAGNOSIS — E1169 Type 2 diabetes mellitus with other specified complication: Secondary | ICD-10-CM | POA: Diagnosis not present

## 2023-01-24 DIAGNOSIS — E785 Hyperlipidemia, unspecified: Secondary | ICD-10-CM | POA: Diagnosis not present

## 2023-01-24 LAB — BASIC METABOLIC PANEL
Anion gap: 11 (ref 5–15)
BUN: 45 mg/dL — ABNORMAL HIGH (ref 6–20)
CO2: 26 mmol/L (ref 22–32)
Calcium: 8.8 mg/dL — ABNORMAL LOW (ref 8.9–10.3)
Chloride: 97 mmol/L — ABNORMAL LOW (ref 98–111)
Creatinine, Ser: 1.9 mg/dL — ABNORMAL HIGH (ref 0.61–1.24)
GFR, Estimated: 43 mL/min — ABNORMAL LOW (ref >60)
Glucose, Bld: 291 mg/dL — ABNORMAL HIGH (ref 70–99)
Potassium: 3.9 mmol/L (ref 3.5–5.1)
Sodium: 134 mmol/L — ABNORMAL LOW (ref 135–145)

## 2023-01-24 LAB — GLUCOSE, CAPILLARY
Glucose-Capillary: 190 mg/dL — ABNORMAL HIGH (ref 70–99)
Glucose-Capillary: 205 mg/dL — ABNORMAL HIGH (ref 70–99)
Glucose-Capillary: 203 mg/dL — ABNORMAL HIGH (ref 70–99)

## 2023-01-24 LAB — MAGNESIUM: Magnesium: 2 mg/dL (ref 1.7–2.4)

## 2023-01-24 MED ORDER — APIXABAN 5 MG PO TABS: 5.0000 mg | ORAL_TABLET | Freq: Two times a day (BID) | ORAL | Status: DC

## 2023-01-24 MED ORDER — APIXABAN 5 MG PO TABS: 10.0000 mg | ORAL_TABLET | Freq: Two times a day (BID) | ORAL | Status: DC

## 2023-01-24 MED ORDER — TORSEMIDE 20 MG PO TABS
40.0000 mg | ORAL_TABLET | Freq: Every day | ORAL | 0 refills | Status: DC
  Filled 2023-01-24: qty 60, 30d supply, fill #0

## 2023-01-24 MED ORDER — ATORVASTATIN CALCIUM 80 MG PO TABS
80.0000 mg | ORAL_TABLET | Freq: Every day | ORAL | 0 refills | Status: DC
  Filled 2023-01-24: qty 30, 30d supply, fill #0

## 2023-01-24 MED ORDER — ISOSORBIDE MONONITRATE ER 30 MG PO TB24
30.0000 mg | ORAL_TABLET | Freq: Every day | ORAL | 0 refills | Status: DC
  Filled 2023-01-24: qty 30, 30d supply, fill #0

## 2023-01-24 MED ORDER — TORSEMIDE 20 MG PO TABS: 20.0000 mg | ORAL_TABLET | Freq: Every day | ORAL | Status: DC

## 2023-01-24 MED ORDER — ASPIRIN 81 MG PO TBEC
81.0000 mg | DELAYED_RELEASE_TABLET | Freq: Every day | ORAL | 0 refills | Status: AC
  Filled 2023-01-24: qty 30, 30d supply, fill #0

## 2023-01-24 MED ORDER — APIXABAN 5 MG PO TABS
ORAL_TABLET | ORAL | 0 refills | Status: DC
  Filled 2023-01-24: qty 60, 27d supply, fill #0

## 2023-01-24 MED ORDER — TORSEMIDE 20 MG PO TABS
40.0000 mg | ORAL_TABLET | Freq: Every day | ORAL | Status: DC
  Administered 2023-01-24: 40 mg via ORAL
  Filled 2023-01-24: qty 2

## 2023-01-24 MED ORDER — HYDRALAZINE HCL 50 MG PO TABS
50.0000 mg | ORAL_TABLET | Freq: Three times a day (TID) | ORAL | 0 refills | Status: DC
  Filled 2023-01-24: qty 90, 30d supply, fill #0

## 2023-01-24 NOTE — Progress Notes (Signed)
Rounding Note    Patient Name: Leroy Simmons Date of Encounter: 01/24/2023  [facility] Cardiologist: Parke Poisson, MD   Subjective   No acute events overnight. Swelling and breathing continue to improve.   Inpatient Medications    Scheduled Meds:  amLODipine  5 mg Oral Daily   apixaban  10 mg Oral BID   Followed by   Melene Muller ON 01/28/2023] apixaban  5 mg Oral BID   aspirin EC  81 mg Oral Daily   atorvastatin  80 mg Oral Daily   hydrALAZINE  50 mg Oral Q8H   insulin aspart  0-5 Units Subcutaneous QHS   insulin aspart  0-9 Units Subcutaneous TID WC   insulin aspart  5 Units Subcutaneous TID WC   insulin glargine-yfgn  18 Units Subcutaneous BID   isosorbide mononitrate  30 mg Oral Daily   metoprolol succinate  50 mg Oral Daily   torsemide  40 mg Oral Daily   Continuous Infusions:   PRN Meds: acetaminophen **OR** acetaminophen, albuterol, bisacodyl, mouth rinse   Vital Signs    Vitals:   01/23/23 1541 01/23/23 2028 01/24/23 0014 01/24/23 0501  BP: (!) 147/93 (!) 148/89 (!) 161/93 121/71  Pulse: 89  93   Resp: 18 20 18 13   Temp: 97.9 F (36.6 C) 98.6 F (37 C) 97.9 F (36.6 C) 97.8 F (36.6 C)  TempSrc:  Oral Oral Oral  SpO2: 98%  94%   Weight:    99.2 kg  Height:        Intake/Output Summary (Last 24 hours) at 01/24/2023 1047 Last data filed at 01/24/2023 8119 Gross per 24 hour  Intake 360 ml  Output 3300 ml  Net -2940 ml      01/24/2023    5:01 AM 01/23/2023    3:57 AM 01/22/2023    5:05 AM  Last 3 Weights  Weight (lbs) 218 lb 12.8 oz 222 lb 6.4 oz 229 lb 11.5 oz  Weight (kg) 99.247 kg 100.88 kg 104.2 kg      Telemetry    SR- Personally Reviewed  ECG    4/19 NSR at 88 bpm - Personally Reviewed  Physical Exam   GEN: Well nourished, well developed in no acute distress NECK: No JVD CARDIAC: regular rhythm, normal S1 and S2, no rubs or gallops. No murmur. VASCULAR: Radial pulses 2+ bilaterally.  RESPIRATORY:  Clear to  auscultation without rales, wheezing or rhonchi  ABDOMEN: Soft, non-tender, non-distended MUSCULOSKELETAL:  Moves all 4 limbs independently SKIN: Warm and dry, bilateral LE wrapped, minimal edema NEUROLOGIC:  No focal neuro deficits noted. PSYCHIATRIC:  Normal affect    Labs    High Sensitivity Troponin:   Recent Labs  Lab 01/18/23 0641 01/18/23 0851 01/19/23 0617  TROPONINIHS 329* 376* 195*     Chemistry Recent Labs  Lab 01/18/23 0641 01/19/23 0214 01/19/23 0617 01/20/23 0057 01/22/23 0106 01/23/23 0055 01/24/23 0047  NA 132* 132* 134*   < > 136 137 134*  K 4.3 3.9 4.0   < > 3.8 3.5 3.9  CL 101 100 98   < > 99 98 97*  CO2 23 25 25    < > 27 28 26   GLUCOSE 489* 353* 346*   < > 202* 202* 291*  BUN 33* 36* 36*   < > 45* 46* 45*  CREATININE 1.71* 1.86* 1.97*   < > 1.79* 1.78* 1.90*  CALCIUM 8.3* 8.0* 8.2*   < > 8.8* 8.7* 8.8*  MG  --  1.8  --   --   --   --  2.0  PROT 6.1*  --  5.7*  --   --   --   --   ALBUMIN 2.5*  --  2.4*  --   --   --   --   AST 63*  --  24  --   --   --   --   ALT 49*  --  35  --   --   --   --   ALKPHOS 140*  --  128*  --   --   --   --   BILITOT 0.6  --  0.8  --   --   --   --   GFRNONAA 48* 44* 41*   < > 46* 46* 43*  ANIONGAP < > < > = values in this interval not displayed.    Lipids  Recent Labs  Lab 01/19/23 0617  CHOL 218*  TRIG 228*  HDL 47  LDLCALC 125*  CHOLHDL 4.6    Hematology Recent Labs  Lab 01/21/23 0225 01/22/23 0106 01/23/23 0055  WBC 6.9 6.8 6.8  RBC 4.16* 4.76 4.28  HGB 12.5* 14.1 12.6*  HCT 36.1* 42.9 39.0  MCV 86.8 90.1 91.1  MCH 30.0 29.6 29.4  MCHC 34.6 32.9 32.3  RDW 12.3 12.3 12.3  PLT 212 277 262   Thyroid No results for input(s): "TSH", "FREET4" in the last 168 hours.  BNP Recent Labs  Lab 01/18/23 0641  BNP 141.8*    DDimer No results for input(s): "DDIMER" in the last 168 hours.   Radiology    No results found.  Cardiac Studies   Echo 09/27/22:  1. Left  ventricular ejection fraction, by estimation, is 55 to 60%. The left ventricle has normal function. The left ventricle has no regional wall motion abnormalities. There is mild left ventricular hypertrophy. Left ventricular diastolic parameters are consistent with Grade II diastolic dysfunction (pseudonormalization).  2. Right ventricular systolic function is normal. The right ventricular size is normal. Tricuspid regurgitation signal is inadequate for assessing PA pressure.  3. Left atrial size was mildly dilated.  4. The mitral valve is normal in structure. Trivial mitral valve regurgitation. No evidence of mitral stenosis.  5. The aortic valve is normal in structure. Aortic valve regurgitation is not visualized. Aortic valve sclerosis/calcification is present, without any evidence of aortic stenosis.  6. The inferior vena cava is dilated in size with >50% respiratory variability, suggesting right atrial pressure of 8 mmHg.   Echo 01/19/23:  1. Limited echo   2. Left ventricular ejection fraction, by estimation, is 60 to 65%. Left  ventricular ejection fraction by PLAX is 62 %. The left ventricle has  normal function. The left ventricle has no regional wall motion  abnormalities.   3. Right ventricular systolic function is normal. The right ventricular  size is normal. There is moderately elevated pulmonary artery systolic  pressure. The estimated right ventricular systolic pressure is 49.3 mmHg.   4. The aortic valve is tricuspid. Aortic valve regurgitation is not  visualized.   5. The inferior vena cava is dilated in size with <50% respiratory  variability, suggesting right atrial pressure of 15 mmHg.    LE Doppler 01/19/23: Summary:  BILATERAL:  -No evidence of popliteal cyst, bilaterally.  -Diffuse subcutaneous edema, bilaterally.  RIGHT:  - There is no evidence of deep vein thrombosis in the  lower extremity.    LEFT:  - Findings consistent with acute deep vein thrombosis  involving the left  peroneal veins.   Patient Profile     52 y.o. male with HFpEF, non-obstructive CAD, hypertension, hyperlipidemia, anxiety, depression, and asthma admitted with acute on chronic HFpEF   Assessment & Plan    Acute on chronic HFpEF Hypertension Acute kidney injury on chronic kidney disease stage 3b -Echo showed normal systolic function, normal RV function, elevated RVSP and right atrial pressure 15 mmHg. -continue diuresis -continue hydralazine, imdur. No ACEi/ARB/ARNI/MRA given AKI. Also on amlodipine. -continue diuresis -weights do not agree. Weight 4/19 107.7 kg. Weight today 99.2 kg. Charted net negative 10.9 L. -Cr today 1.9, improved from 2.12 on 4/19 but up from 4/22. BUN up as well. Will transition to oral diuretic today. Given LE edema, concern for gut edema, will change to torsemide. Monitor UOP. -K 3.9   Nonobstructive CAD: Atypical chest pain: Demand ischemia: Hyperlipidemia: -High-sensitivity troponin was elevated to 376 and is down trending. Likely demand ischemia -no further chest pain -left heart cath 03/2022 that revealed 50% D2, 40% mid LAD, and 25% ramus lesions  -Continue metoprolol, Imdur, and atorvastatin.   DVT:  -L peroneal vein acute DVT.  Transitioned from heparin to DOAC  Type II diabetes -A1c 11% -on insulin -as blood sugars improve, consider restarting SLGT2i  [facility] will sign off.   Medication Recommendations:   Continue: amlodipine 5 mg daily, aspirin 81 mg daily (hold with any bleeding as apixaban has priority), atorvastatin 80 mg, hydralazine 50 mg TID, imdur 30 mg daily, metoprolol succinate 50 mg daily. Started torsemide 40 mg daily, replaces prior furosemide. Hold lisinopril given renal function. Other recommendations (labs, testing, etc):  Consider restarting Jardiance at follow up if sugars improved and kidneys remain stable Follow up as an outpatient:  Has appt with HF impact clinic on 02/08/23.   For  questions or updates, please contact [facility] Please consult [email or web address] for contact info under    Signed, Jodelle Red, MD  01/24/2023, 10:47 AM

## 2023-01-24 NOTE — Progress Notes (Signed)
s  Heart Failure Stewardship Pharmacist Progress Note   PCP: de Peru, Buren Kos, MD PCP-Cardiologist: Parke Poisson, MD    HPI:  52 yo M with PMH of HTN, HLD, CHF, CAD, anxiety, T2DM, pulmonary nodules, and asthma.  Presented to the ED on 4/17 with shortness of breath, LE edema, orthopnea, and chest pressure. Reports that his lasix wasn't working as well any more to remove fluid. CXR concerning for mild CHF. ECHO 4/18 with LVEF 60-65% (was 55-60% in 03/2022 and 09/2022), no regional wall motion abnormalities, RV normal. Also with new acute DVT.  Current HF Medications: Diuretic: torsemide 40 mg daily Beta Blocker: metoprolol XL 50 mg daily Other: hydralazine 50 mg TID + Imdur 30 mg daily  Prior to admission HF Medications: Diuretic: furosemide 40 mg daily Beta blocker: metoprolol XL 50 mg daily - no recent fill history for this ACE/ARB/ARNI: lisinopril 20 mg daily SGLT2i: Jardiance 10 mg daily - he states he receives this for free through the health dept / [facility]   Pertinent Lab Values: Serum creatinine 1.90, BUN 45, Potassium 3.9, Sodium 134, BNP 141.8, Magnesium 2.0, A1c 11  Vital Signs: Weight: 218 lbs (admission weight 237 lbs) Blood pressure: 140/80s Heart rate: 80-90s  I/O: -3.6L yesterday; net -13L  Medication Assistance / Insurance Benefits Check: Does the patient have prescription insurance?  Yes - insurance started on 4/1 Type of insurance plan: Hospital doctor  Outpatient Pharmacy:  Prior to admission outpatient pharmacy: [facility] Is the patient willing to use [facility] TOC pharmacy at discharge? Yes Is the patient willing to transition their outpatient pharmacy to utilize a [facility] outpatient pharmacy?   Yes - transitioned to [facility] OP    Assessment: 1. Acute on chronic diastolic CHF (LVEF 60-65%). NYHA class III symptoms. - Agree with transitioning to torsemide 40 mg daily. Strict I/Os and daily weights. Keep K>4 and Mg>2. - Continue metoprolol XL 50  mg daily - Continue hydralazine 50 mg TID + Imdur 30 mg daily - Holding ARB/ARNI with AKI, creatinine up from yesterday  - Holding Jardiance with elevated A1c  Plan: 1) Medication changes recommended at this time: - Agree with changes   2) Patient assistance: - $4,495 deductible remaining on insurance - Entresto copay $656.56 until deductible met - copay card will lower this to $10 per month now - Jardiance requires prior authorization, can complete if this is restarted - Previously received [place] through health dept / [place]. He is unsure if he still qualifies for this now that he has insurance - Marcelline Deist not available on formulary  3)  Education  - Patient has been educated on current HF medications and potential additions to HF medication regimen - Patient verbalizes understanding that over the next few months, these medication doses may change and more medications may be added to optimize HF regimen - Patient has been educated on basic disease state pathophysiology and goals of therapy   Sharen Hones, PharmD, BCPS Heart Failure Engineer, building services Phone [phone]

## 2023-01-24 NOTE — Discharge Summary (Addendum)
**Note Leroy-Identified via Obfuscation** Physician Discharge Summary   Patient: Leroy Simmons MRN: [id number] DOB: 05-03-1971  Admit date:     01/18/2023  Discharge date: 01/24/23  Discharge Physician: Leroy Simmons   PCP: Leroy Simmons   Recommendations at discharge:    Continue diuresis with torsemide 40 mg po daily.  Afterload reduction with hydralazine and isosorbide.  Holding Ace inh, ARB, or spironolactone until renal function more stable. Plan to resume SGLT 2 inh as outpatient if renal function more stable. Follow up with Leroy Simmons in 7 to 10 days. Follow up with Cardiology as scheduled.    Discharge Diagnoses: Principal Problem:   Acute on chronic diastolic CHF (congestive heart failure) Active Problems:   Essential hypertension   Acute kidney injury superimposed on CKD   Type 2 diabetes mellitus with hyperlipidemia   Acute deep vein thrombosis (DVT) of left peroneal vein   Obesity (BMI 30-39.9)  Resolved Problems:   * No resolved hospital problems. [facility] Course: Leroy Simmons was admitted to the hospital with the working diagnosis of heart failure exacerbation.   52 yo male with the past medical history of heart failure, coronary artery disease. T2DM and asthma who presented with worsening lower extremity edema. Worsening symptoms for one week, with chest tightness, progressive edema, orthopnea and PND. On his initial physical examination his blood pressure was 148/95, HR 84, RR 17 and 02 saturation 99%, lungs with no wheezing or rales, heart with S1 and S2 present and rhythmic, abdomen with no distention and positive lower extremity edema.   Na 137, K 4,3 Cl 101, bicarbonate 23, glucose 489, bun 33 cr 1,71  AST 63, ALT 49  BNP 141  High sensitive troponin 329 and 376  Wbc 7,8 hgb 13.6 plt 238   Chest radiograph with cardiomegaly with bilateral hilar vascular congestion with bilateral interstitial infiltrates, cephalization of the vasculature.  EKG 96 bpm, normal axis, normal  intervals, sinus rhythm with no significant ST segment changes, negative T wave lead I and AvL. (Not new changes).   Patient was placed on IV furosemide for diuresis and heparin drip for anticoagulation.   Limited echocardiogram with no wall motion abnormalities and preserved LV systolic function.   04/20 continue volume overloaded.   04/21: Patient demonstrated improvement in his breathing, but is still with signs of fluid overload on examination.  Following cardiology recommendations we will continue IV diuresis.  Continue close monitoring of patient renal function and electrolytes.  No final decision made regarding catheterization, continue heparin drip. 04/22 patient continue to respond to diuresis, changed from heparin to enoxaparin for anticoagulation.   04/23 patient with improvement in volume status, plan to continue diuresis with oral torsemide and follow up as outpatient.   Assessment and Plan: * Acute on chronic diastolic CHF (congestive heart failure) Echocardiogram with preserved LV systolic function with EF 60 to 65%, RV with preserved systolic function, RVSP 49.3 , no atrial dilatation, no pericardial effusion. No significant valvular disease.   Troponin elevation due to demand ischemia from CHF exacerbation. (NSTEMI rule out).   Patient was placed on IV furosemide for diuresis, negative fluid balance was achieved, - 12,999 ml with significant improvement in his symptoms.   Plan to continue with hydralazine/isosorbide for afterload reduction. Continue metoprolol.  Blood pressure control with amlodipine.  Diuresis with torsemide.     Essential hypertension Continue blood pressure control with metoprolol, hydralazine and isosorbide. Continue diuresis with torsemide.   Acute kidney injury superimposed on CKD -  Baseline serum cr 1,5 to 2,0 (old records personally reviewed). -CKD stage 3b, hyponatremia, hypokalemia.   Renal function today with serum cr at 1,90 with K at  3,9 and serum bicarbonate at 26. Na is 134 and Mg at 2,0  Plan to continue diuresis at home with torsemide. Follow up renal function as outpatient in 7 days. Continue with fluid restriction at home, 1,500 ml per day.    Type 2 diabetes mellitus with hyperlipidemia -Uncontrolled hyperglycemia.  -A1C 11  Patient was placed on basal and sliding scale insulin. At the time of his discharge his fasting glucose was 291 mg/dl. Capillary 164, 190 and 205. Plan to continue insulin therapy at home.   Continue with statin therapy.  Acute deep vein thrombosis (DVT) of left peroneal vein -Left acute deep vein thrombosis involving the left peroneal veins. (Not right)  Patient was placed on heparin drip with good toleration, he has been transitioned to oral apixaban. Follow up as outpatient.   Obesity (BMI 30-39.9) -Body mass index is 34.93 kg/m. -Low-calorie diet, portion control and increase physical activity discussed with patient.           Consultants: cardiology  Procedures performed: none   Disposition: Home Diet recommendation:  Cardiac and Carb modified diet DISCHARGE MEDICATION: Allergies as of 01/24/2023   No Known Allergies      Medication List     STOP taking these medications    furosemide 40 MG tablet Commonly known as: LASIX   Jardiance 10 MG Tabs tablet Generic drug: empagliflozin   lisinopril 20 MG tablet Commonly known as: ZESTRIL   sertraline 25 MG tablet Commonly known as: ZOLOFT       TAKE these medications    albuterol 108 (90 Base) MCG/ACT inhaler Commonly known as: VENTOLIN HFA Inhale 2 puffs into the lungs every 6 (six) hours as needed for wheezing or shortness of breath.   amLODipine 5 MG tablet Commonly known as: NORVASC Take 1 tablet (5 mg total) by mouth daily.   apixaban 5 MG Tabs tablet Commonly known as: ELIQUIS Take 2 tablets twice daily until 01/27/23, then continue with one tablet twice daily starting on 01/28/23.    aspirin EC 81 MG tablet Take 1 tablet (81 mg total) by mouth daily. Swallow whole. Start taking on: January 25, 2023   atorvastatin 80 MG tablet Commonly known as: LIPITOR Take 1 tablet (80 mg total) by mouth daily. Start taking on: January 25, 2023   blood glucose meter kit and supplies Dispense based on patient and insurance preference. Use up to four times daily as directed. (FOR ICD-10 E10.9, E11.9).   busPIRone 5 MG tablet Commonly known as: BUSPAR TAKE 1 & 1/2 (ONE & ONE-HALF) TABLETS BY MOUTH TWICE DAILY What changed: See the new instructions.   hydrALAZINE 50 MG tablet Commonly known as: APRESOLINE Take 1 tablet (50 mg total) by mouth every 8 (eight) hours.   isosorbide mononitrate 30 MG 24 hr tablet Commonly known as: IMDUR Take 1 tablet (30 mg total) by mouth daily.   Lantus SoloStar 100 UNIT/ML Solostar Pen Generic drug: insulin glargine Inject 26 Units into the skin daily. What changed: when to take this   metFORMIN 500 MG 24 hr tablet Commonly known as: GLUCOPHAGE-XR Take 4 tablets (2,000 mg total) by mouth at bedtime.   metoprolol succinate 50 MG 24 hr tablet Commonly known as: TOPROL-XL Take 1 tablet (50 mg total) by mouth daily. Take with or immediately following a meal. What changed: additional  instructions   mupirocin ointment 2 % Commonly known as: BACTROBAN Apply 1 Application topically 2 (two) times daily.   rosuvastatin 20 MG tablet Commonly known as: CRESTOR Take 1 tablet (20 mg total) by mouth daily.   torsemide 20 MG tablet Commonly known as: DEMADEX Take 2 tablets (40 mg total) by mouth daily. Start taking on: January 25, 2023   Unifine Pentips 31G X 5 MM Misc Generic drug: Insulin Pen Needle Use daily with insulin pen. (1 each by Does not apply route daily.)        Follow-up Information     [facility]. Go in 16 day(s).   Specialty: Cardiology Why: Hospital follow up 02/08/2023 @ 2  pm PLEASE bring a current medication list to appointment FREE valet parking, Entrance C, off [employer] information: [address] [id number] [place] [address] [phone]        [facility]. Go on 02/09/2023.   Why: @3 :30pm Contact information: [place] [phone] [phone]               Discharge Exam: Filed Weights   01/22/23 0505 01/23/23 0357 01/24/23 0501  Weight: 104.2 kg 100.9 kg 99.2 kg   BP (!) 146/85 (BP Location: Right Arm)   Pulse 87   Temp 97.8 F (36.6 C)   Resp 18   Ht 5\' 8"  (1.727 m)   Wt 99.2 kg   SpO2 93%   BMI 33.27 kg/m   Patient with no chest pain or dyspnea, edema continue to improve, no dyspnea, PND or orthopnea  Neurology awake and alert ENT with mild pallor Cardiovascular with S1 and S2 present and rhythmic with no gallops, rubs or murmurs No JVD Trace lower extremity at the feet, unna boots in place. Respiratory with no rales or wheezing, no rhonchi Abdomen with no distention   Condition at discharge: stable  The results of significant diagnostics from this hospitalization (including imaging, microbiology, ancillary and laboratory) are listed below for reference.   Imaging Studies: ECHOCARDIOGRAM LIMITED  Result Date: 01/19/2023    ECHOCARDIOGRAM LIMITED REPORT   Patient Name:   DENNISE BAMBER Date of Exam: 01/19/2023 Medical Rec #:  [id number]      Height:       68.0 in Accession #:    [id number]     Weight:       230.0 lb Date of Birth:  1970/11/12      BSA:          2.169 m Patient Age:    51 years       BP:           117/65 mmHg Patient Gender: M              HR:           89 bpm. Exam Location:  Inpatient Procedure: 2D Echo, Cardiac Doppler and Color Doppler Indications:    Abnormal ECG  History:        Patient has prior history of Echocardiogram examinations, most                 recent 03/17/2022. CHF, CAD; Risk  Factors:Hypertension and                 Diabetes.  Sonographer:    Wallie Char Referring Phys: [id number] [facility] GOEL IMPRESSIONS  1. Limited echo  2. Left ventricular ejection fraction,  by estimation, is 60 to 65%. Left ventricular ejection fraction by PLAX is 62 %. The left ventricle has normal function. The left ventricle has no regional wall motion abnormalities.  3. Right ventricular systolic function is normal. The right ventricular size is normal. There is moderately elevated pulmonary artery systolic pressure. The estimated right ventricular systolic pressure is 49.3 mmHg.  4. The aortic valve is tricuspid. Aortic valve regurgitation is not visualized.  5. The inferior vena cava is dilated in size with <50% respiratory variability, suggesting right atrial pressure of 15 mmHg. Comparison(s): Changes from prior study are noted. 03/17/2022: LVEF 55-60%. FINDINGS  Left Ventricle: Left ventricular ejection fraction, by estimation, is 60 to 65%. Left ventricular ejection fraction by PLAX is 62 %. The left ventricle has normal function. The left ventricle has no regional wall motion abnormalities. The left ventricular internal cavity size was normal in size. There is no left ventricular hypertrophy. Right Ventricle: The right ventricular size is normal. No increase in right ventricular wall thickness. Right ventricular systolic function is normal. There is moderately elevated pulmonary artery systolic pressure. The tricuspid regurgitant velocity is 2.93 m/s, and with an assumed right atrial pressure of 15 mmHg, the estimated right ventricular systolic pressure is 49.3 mmHg. Left Atrium: Left atrial size was normal in size. Right Atrium: Right atrial size was normal in size. Pericardium: There is no evidence of pericardial effusion. Mitral Valve: MV peak gradient, 7.4 mmHg. The mean mitral valve gradient is 3.0 mmHg. Tricuspid Valve: The tricuspid valve is grossly normal. Tricuspid valve regurgitation is trivial.  Aortic Valve: The aortic valve is tricuspid. Aortic valve regurgitation is not visualized. Aortic valve mean gradient measures 3.0 mmHg. Aortic valve peak gradient measures 5.4 mmHg. Aortic valve area, by VTI measures 3.45 cm. Aorta: The aortic root and ascending aorta are structurally normal, with no evidence of dilitation. Venous: The inferior vena cava is dilated in size with less than 50% respiratory variability, suggesting right atrial pressure of 15 mmHg. IAS/Shunts: No atrial level shunt detected by color flow Doppler. LEFT VENTRICLE PLAX 2D LV EF:         Left ventricular ejection fraction by PLAX is 62 %. LVIDd:         4.80 cm LVIDs:         3.20 cm LV PW:         1.00 cm LV IVS:        1.00 cm LVOT diam:     1.90 cm LV SV:         62 LV SV Index:   28 LVOT Area:     2.84 cm  LV Volumes (MOD) LV vol d, MOD A2C: 69.3 ml LV vol d, MOD A4C: 87.1 ml LV vol s, MOD A2C: 29.4 ml LV vol s, MOD A4C: 36.0 ml LV SV MOD A2C:     39.9 ml LV SV MOD A4C:     87.1 ml LV SV MOD BP:      44.9 ml RIGHT VENTRICLE         IVC TAPSE (M-mode): 2.1 cm  IVC diam: 2.40 cm LEFT ATRIUM             Index        RIGHT ATRIUM           Index LA diam:        4.60 cm 2.12 cm/m   RA Area:     12.30 cm LA Vol (A2C):   47.5 ml  21.90 ml/m  RA Volume:   22.80 ml  10.51 ml/m LA Vol (A4C):   62.8 ml 28.96 ml/m LA Biplane Vol: 54.3 ml 25.04 ml/m  AORTIC VALVE AV Area (Vmax):    3.13 cm AV Area (Vmean):   3.73 cm AV Area (VTI):     3.45 cm AV Vmax:           116.00 cm/s AV Vmean:          75.500 cm/s AV VTI:            0.179 m AV Peak Grad:      5.4 mmHg AV Mean Grad:      3.0 mmHg LVOT Vmax:         128.00 cm/s LVOT Vmean:        99.200 cm/s LVOT VTI:          0.218 m LVOT/AV VTI ratio: 1.22  AORTA Ao Root diam: 3.50 cm Ao Asc diam:  3.50 cm MITRAL VALVE                TRICUSPID VALVE MV Area (PHT): 4.60 cm     TR Peak grad:   34.3 mmHg MV Area VTI:   1.89 cm     TR Vmax:        293.00 cm/s MV Peak grad:  7.4 mmHg MV Mean grad:  3.0  mmHg     SHUNTS MV Vmax:       1.36 m/s     Systemic VTI:  0.22 m MV Vmean:      81.9 cm/s    Systemic Diam: 1.90 cm MV Decel Time: 165 msec MV E velocity: 135.00 cm/s MV A velocity: 52.50 cm/s MV E/A ratio:  2.57 Zoila Shutter Simmons Electronically signed by Zoila Shutter Simmons Signature Date/Time: 01/19/2023/5:09:50 PM    Final    VAS [place] LOWER EXTREMITY VENOUS (DVT)  Result Date: 01/19/2023  Lower Venous DVT Study Patient Name:  JOSHU FURUKAWA  Date of Exam:   01/19/2023 Medical Rec #: [id number]       Accession #:    [id number] Date of Birth: 06-Oct-1970       Patient Gender: M Patient Age:   47 years Exam Location:  [facility] Procedure:      VAS [place] LOWER EXTREMITY VENOUS (DVT) Referring Phys: [facility] GOEL --------------------------------------------------------------------------------  Indications: Swelling.  Limitations: Poor ultrasound/tissue interface. Comparison Study: Previous exam on 07/29/22 was negative for DVT. Performing Technologist: Ernestene Mention RVT, RDMS  Examination Guidelines: A complete evaluation includes B-mode imaging, spectral Doppler, color Doppler, and power Doppler as needed of all accessible portions of each vessel. Bilateral testing is considered an integral part of a complete examination. Limited examinations for reoccurring indications may be performed as noted. The reflux portion of the exam is performed with the patient in reverse Trendelenburg.  +---------+---------------+---------+-----------+----------+--------------+ RIGHT    CompressibilityPhasicitySpontaneityPropertiesThrombus Aging +---------+---------------+---------+-----------+----------+--------------+ CFV      Full           Yes      Yes                                 +---------+---------------+---------+-----------+----------+--------------+ SFJ      Full                                                        +---------+---------------+---------+-----------+----------+--------------+  FV Prox  Full            Yes      Yes                                 +---------+---------------+---------+-----------+----------+--------------+ FV Mid   Full           Yes      Yes                                 +---------+---------------+---------+-----------+----------+--------------+ FV DistalFull           Yes      Yes                                 +---------+---------------+---------+-----------+----------+--------------+ PFV      Full                                                        +---------+---------------+---------+-----------+----------+--------------+ POP      Full           Yes      Yes                                 +---------+---------------+---------+-----------+----------+--------------+ PTV      Full                                                        +---------+---------------+---------+-----------+----------+--------------+ PERO     Full                                                        +---------+---------------+---------+-----------+----------+--------------+   +---------+---------------+---------+-----------+----------+--------------+ LEFT     CompressibilityPhasicitySpontaneityPropertiesThrombus Aging +---------+---------------+---------+-----------+----------+--------------+ CFV      Full           Yes      Yes                                 +---------+---------------+---------+-----------+----------+--------------+ SFJ      Full                                                        +---------+---------------+---------+-----------+----------+--------------+ FV Prox  Full           Yes      Yes                                 +---------+---------------+---------+-----------+----------+--------------+ FV Mid   Full  Yes      Yes                                 +---------+---------------+---------+-----------+----------+--------------+ FV DistalFull           Yes      Yes                                  +---------+---------------+---------+-----------+----------+--------------+ PFV      Full                                                        +---------+---------------+---------+-----------+----------+--------------+ POP      Full           Yes      Yes                                 +---------+---------------+---------+-----------+----------+--------------+ PTV      Full                                                        +---------+---------------+---------+-----------+----------+--------------+ PERO     None           No       No                                  +---------+---------------+---------+-----------+----------+--------------+     Summary: BILATERAL: -No evidence of popliteal cyst, bilaterally. -Diffuse subcutaneous edema, bilaterally. RIGHT: - There is no evidence of deep vein thrombosis in the lower extremity.  LEFT: - Findings consistent with acute deep vein thrombosis involving the left peroneal veins.  *See table(s) above for measurements and observations. Electronically signed by Heath Lark on 01/19/2023 at 5:01:19 PM.    Final    US Abdomen Limited RUQ (LIVER/GB)  Result Date: 01/18/2023 CLINICAL DATA:  [id number] with hepatitis. EXAM: ULTRASOUND ABDOMEN LIMITED RIGHT UPPER QUADRANT COMPARISON:  CT with IV contrast 09/25/2022 FINDINGS: Gallbladder: No gallstones or wall thickening visualized. No sonographic Murphy sign noted by sonographer. Common bile duct: Diameter: 4.0 mm with no intrahepatic biliary prominence. Liver: No focal lesion identified. There is mild generalized increased hepatic echogenicity consistent with the steatosis noted on the prior CT. Portal vein is patent on color Doppler imaging with normal direction of blood flow towards the liver. The portal vein again measures slightly prominent at 15 mm. This was seen previously. Other: None. IMPRESSION: 1. No gallstones, wall thickening or biliary dilatation. 2. Mild hepatic steatosis.  Electronically Signed   By: Almira Bar M.D.   On: 01/18/2023 20:53   DG Chest 2 View  Result Date: 01/18/2023 CLINICAL DATA:  52 year old male with history of chest pain and dizziness. EXAM: CHEST - 2 VIEW COMPARISON:  Chest x-ray 12/22/2022. FINDINGS: There is cephalization of the pulmonary vasculature and slight indistinctness of the interstitial markings suggestive of mild pulmonary edema. No definite pleural effusions. No pneumothorax. Heart  size is mildly enlarged. Upper mediastinal contours are within normal limits. IMPRESSION: 1. The appearance of the chest is most concerning for mild congestive heart failure, as above. Electronically Signed   By: Trudie Reed M.D.   On: 01/18/2023 06:50    Microbiology: Results for orders placed or performed during the hospital encounter of 09/25/22  Urine Culture     Status: Abnormal   Collection Time: 09/25/22  6:30 PM   Specimen: Urine, Clean Catch  Result Value Ref Range Status   Specimen Description   Final    URINE, CLEAN CATCH Performed at [facility], [address], [place], [place] [address]    Special Requests   Final    NONE Performed at [facility], [address] [place], [place] [address]    Culture (A)  Final    <10,000 COLONIES/mL INSIGNIFICANT GROWTH Performed at [facility], [address] [place], [place] [address]    Report Status 09/27/2022 FINAL  Final  Aerobic/Anaerobic Culture w Gram Stain (surgical/deep wound)     Status: None   Collection Time: 09/26/22  1:21 PM   Specimen: Appendix; GI  Result Value Ref Range Status   Specimen Description   Final    APPENDIX Performed at [facility], [address] [place], [place] [address]    Special Requests   Final    NONE Performed at [facility], [address] [place], [place] [address]    Gram Stain   Final    MODERATE WBC PRESENT, PREDOMINANTLY MONONUCLEAR NO ORGANISMS SEEN    Culture   Final    RARE  BACTEROIDES THETAIOTAOMICRON BETA LACTAMASE POSITIVE Performed at [facility], [address], [place], [place] [address]    Report Status 09/30/2022 FINAL  Final    Labs: CBC: Recent Labs  Lab 01/18/23 0641 01/19/23 0214 01/19/23 0617 01/20/23 0057 01/21/23 0225 01/22/23 0106 01/23/23 0055  WBC 7.8   < > 10.3 9.5 6.9 6.8 6.8  NEUTROABS 4.4  --   --   --   --   --   --   HGB 13.6   < > 12.3* 11.3* 12.5* 14.1 12.6*  HCT 39.3   < > 35.6* 32.8* 36.1* 42.9 39.0  MCV 87.1   < > 86.8 86.8 86.8 90.1 91.1  PLT 238   < > 209 176 212 277 262   < > = values in this interval not displayed.   Basic Metabolic Panel: Recent Labs  Lab 01/19/23 0214 01/19/23 0617 01/20/23 0057 01/21/23 0225 01/22/23 0106 01/23/23 0055 01/24/23 0047  NA 132*   < > 135 133* 136 137 134*  K 3.9   < > 3.5 3.1* 3.8 3.5 3.9  CL 100   < > 98 100 99 98 97*  CO2 25   < > 24 26 27 28 26   GLUCOSE 353*   < > 213* 176* 202* 202* 291*  BUN 36*   < > 42* 42* 45* 46* 45*  CREATININE 1.86*   < > 2.12* 1.95* 1.79* 1.78* 1.90*  CALCIUM 8.0*   < > 8.1* 8.1* 8.8* 8.7* 8.8*  MG 1.8  --   --   --   --   --  2.0   < > = values in this interval not displayed.   Liver Function Tests: Recent Labs  Lab 01/18/23 0641 01/19/23 0617  AST 63* 24  ALT 49* 35  ALKPHOS 140* 128*  BILITOT 0.6 0.8  PROT 6.1* 5.7*  ALBUMIN 2.5* 2.4*   CBG: Recent Labs  Lab 01/23/23 1123 01/23/23 1540 01/23/23 2134 01/24/23 0613 01/24/23 1111  GLUCAP 235* 222* 164* 190* 205*    Discharge time spent: greater than 30 minutes.  Signed: Coralie Keens, [facility] 01/24/2023

## 2023-01-25 ENCOUNTER — Telehealth (HOSPITAL_BASED_OUTPATIENT_CLINIC_OR_DEPARTMENT_OTHER): Payer: Self-pay

## 2023-01-25 NOTE — Transitions of Care (Post Inpatient/ED Visit) (Signed)
   01/25/2023  Name: Leroy Simmons MRN: [id number] DOB: June 18, 1971  Today's TOC FU Call Status: Today's TOC FU Call Status:: Successful TOC FU Call Competed TOC FU Call Complete Date: 01/25/23  Transition Care Management Follow-up Telephone Call Date of Discharge: 01/24/23 Discharge Facility: [facility]) Type of Discharge: Inpatient Admission Primary Inpatient Discharge Diagnosis:: NSTEMI How have you been since you were released from the hospital?: Better Any questions or concerns?: No  Items Reviewed: Did you receive and understand the discharge instructions provided?: Yes Medications obtained and verified?: Yes (Medications Reviewed) Any new allergies since your discharge?: No Dietary orders reviewed?: Yes Do you have support at home?: Yes People in Home: friend(s)  Home Care and Equipment/Supplies: Were Home Health Services Ordered?: NA Any new equipment or medical supplies ordered?: NA  Functional Questionnaire: Do you need assistance with bathing/showering or dressing?: No Do you need assistance with meal preparation?: No Do you need assistance with eating?: No Do you have difficulty maintaining continence: No Do you need assistance with getting out of bed/getting out of a chair/moving?: No Do you have difficulty managing or taking your medications?: No  Follow up appointments reviewed: PCP Follow-up appointment confirmed?: No (declined appt) MD Provider Line [phone] Given: No Specialist Hospital Follow-up appointment confirmed?: Yes Date of Specialist follow-up appointment?: 02/08/23 Follow-Up Specialty Provider:: Cardio Do you need transportation to your follow-up appointment?: No Do you understand care options if your condition(s) worsen?: Yes-patient verbalized understanding    SIGNATURE Karena Addison, LPN [facility] Nurse Health Advisor Direct Dial [phone]

## 2023-02-07 ENCOUNTER — Telehealth (HOSPITAL_COMMUNITY): Payer: Self-pay

## 2023-02-07 NOTE — Telephone Encounter (Signed)
Called to confirm Heart & Vascular Transitions of Care appointment at 02/08/23. Patient reminded to bring all medications and pill box organizer with them. Confirmed patient has transportation. Gave directions, instructed to utilize valet parking.  Confirmed appointment prior to ending call.   

## 2023-02-08 ENCOUNTER — Other Ambulatory Visit (HOSPITAL_COMMUNITY): Payer: Self-pay

## 2023-02-08 ENCOUNTER — Ambulatory Visit (HOSPITAL_COMMUNITY)
Admit: 2023-02-08 | Discharge: 2023-02-08 | Disposition: A | Discharge status: Home / Self Care | Payer: 59 | Attending: Cardiology | Admitting: Cardiology

## 2023-02-08 ENCOUNTER — Encounter (HOSPITAL_COMMUNITY): Payer: Self-pay

## 2023-02-08 VITALS — BP 150/84 | HR 89 | Wt 232.0 lb

## 2023-02-08 DIAGNOSIS — J45909 Unspecified asthma, uncomplicated: Secondary | ICD-10-CM | POA: Diagnosis not present

## 2023-02-08 DIAGNOSIS — Z86718 Personal history of other venous thrombosis and embolism: Secondary | ICD-10-CM | POA: Diagnosis not present

## 2023-02-08 DIAGNOSIS — R6881 Early satiety: Secondary | ICD-10-CM | POA: Insufficient documentation

## 2023-02-08 DIAGNOSIS — Z79899 Other long term (current) drug therapy: Secondary | ICD-10-CM | POA: Diagnosis not present

## 2023-02-08 DIAGNOSIS — I5032 Chronic diastolic (congestive) heart failure: Secondary | ICD-10-CM

## 2023-02-08 DIAGNOSIS — R0602 Shortness of breath: Secondary | ICD-10-CM | POA: Diagnosis not present

## 2023-02-08 DIAGNOSIS — Z6835 Body mass index (BMI) 35.0-35.9, adult: Secondary | ICD-10-CM | POA: Diagnosis not present

## 2023-02-08 DIAGNOSIS — E785 Hyperlipidemia, unspecified: Secondary | ICD-10-CM | POA: Diagnosis not present

## 2023-02-08 DIAGNOSIS — I130 Hypertensive heart and chronic kidney disease with heart failure and stage 1 through stage 4 chronic kidney disease, or unspecified chronic kidney disease: Secondary | ICD-10-CM | POA: Insufficient documentation

## 2023-02-08 DIAGNOSIS — I2510 Atherosclerotic heart disease of native coronary artery without angina pectoris: Secondary | ICD-10-CM | POA: Insufficient documentation

## 2023-02-08 DIAGNOSIS — Z794 Long term (current) use of insulin: Secondary | ICD-10-CM | POA: Diagnosis not present

## 2023-02-08 DIAGNOSIS — F419 Anxiety disorder, unspecified: Secondary | ICD-10-CM | POA: Insufficient documentation

## 2023-02-08 DIAGNOSIS — E669 Obesity, unspecified: Secondary | ICD-10-CM | POA: Diagnosis not present

## 2023-02-08 DIAGNOSIS — E8770 Fluid overload, unspecified: Secondary | ICD-10-CM | POA: Diagnosis not present

## 2023-02-08 DIAGNOSIS — Z7901 Long term (current) use of anticoagulants: Secondary | ICD-10-CM | POA: Insufficient documentation

## 2023-02-08 DIAGNOSIS — N1832 Chronic kidney disease, stage 3b: Secondary | ICD-10-CM | POA: Insufficient documentation

## 2023-02-08 DIAGNOSIS — I272 Pulmonary hypertension, unspecified: Secondary | ICD-10-CM

## 2023-02-08 DIAGNOSIS — Z8249 Family history of ischemic heart disease and other diseases of the circulatory system: Secondary | ICD-10-CM | POA: Insufficient documentation

## 2023-02-08 DIAGNOSIS — Z7984 Long term (current) use of oral hypoglycemic drugs: Secondary | ICD-10-CM | POA: Insufficient documentation

## 2023-02-08 DIAGNOSIS — E1122 Type 2 diabetes mellitus with diabetic chronic kidney disease: Secondary | ICD-10-CM | POA: Insufficient documentation

## 2023-02-08 LAB — CBC
HCT: 39.8 % (ref 39.0–52.0)
Hemoglobin: 14.2 g/dL (ref 13.0–17.0)
MCH: 30.4 pg (ref 26.0–34.0)
MCHC: 35.7 g/dL (ref 30.0–36.0)
MCV: 85.2 fL (ref 80.0–100.0)
Platelets: 304 10*3/uL (ref 150–400)
RBC: 4.67 MIL/uL (ref 4.22–5.81)
RDW: 11.8 % (ref 11.5–15.5)
WBC: 7.6 10*3/uL (ref 4.0–10.5)
nRBC: 0 % (ref 0.0–0.2)

## 2023-02-08 LAB — BRAIN NATRIURETIC PEPTIDE: B Natriuretic Peptide: 101.4 pg/mL — ABNORMAL HIGH (ref 0.0–100.0)

## 2023-02-08 LAB — BASIC METABOLIC PANEL
Anion gap: 9 (ref 5–15)
BUN: 38 mg/dL — ABNORMAL HIGH (ref 6–20)
CO2: 27 mmol/L (ref 22–32)
Calcium: 9 mg/dL (ref 8.9–10.3)
GFR, Estimated: 39 mL/min — ABNORMAL LOW (ref >60)
Potassium: 4.3 mmol/L (ref 3.5–5.1)
Sodium: 132 mmol/L — ABNORMAL LOW (ref 135–145)

## 2023-02-08 MED ORDER — TORSEMIDE 20 MG PO TABS
40.0000 mg | ORAL_TABLET | Freq: Two times a day (BID) | ORAL | 5 refills | Status: DC
  Filled 2023-02-08: qty 120, 30d supply, fill #0
  Filled 2023-09-20: qty 120, 30d supply, fill #1

## 2023-02-08 NOTE — Patient Instructions (Signed)
Labs done today. We will contact you only if your labs are abnormal.  INCREASE Torsemide to 40mg  (2 tablets) by mouth 2 times daily.   No other medication changes were made. Please continue all current medications as prescribed.  Your provider has recommended that you have a home sleep study (Itamar Test).  We have provided you with the equipment in our office today. Please go ahead and download the app. Your pin number is [id number]. Once you have completed the test you just dispose of the equipment, the information is automatically uploaded to [place] via blue-tooth technology. If your test is positive for sleep apnea and you need a home CPAP machine you will be contacted by Dr Norris Cross office [facility]) to set this up.  Your physician recommends that you schedule a follow-up appointment in: 2 weeks post cath with our NP/PA Clinic here in our office.   If you have any questions or concerns before your next appointment please send [place] a message through [place] or call our office at [phone].    TO LEAVE A MESSAGE FOR THE NURSE SELECT OPTION 2, PLEASE LEAVE A MESSAGE INCLUDING: YOUR NAME DATE OF BIRTH CALL BACK NUMBER REASON FOR CALL**this is important as we prioritize the call backs  YOU WILL RECEIVE A CALL BACK THE SAME DAY AS LONG AS YOU CALL BEFORE 4:00 PM   Do the following things EVERYDAY: Weigh yourself in the morning before breakfast. Write it down and keep it in a log. Take your medicines as prescribed Eat low salt foods--Limit salt (sodium) to 2000 mg per day.  Stay as active as you can everyday Limit all fluids for the day to less than 2 liters   At the [facility], you and your health needs are our priority. As part of our continuing mission to provide you with exceptional heart care, we have created designated Provider Care Teams. These Care Teams include your primary Cardiologist (physician) and Advanced Practice Providers (APPs- Physician Assistants and  Nurse Practitioners) who all work together to provide you with the care you need, when you need it.   You may see any of the following providers on your designated Care Team at your next follow up: Dr Arvilla Meres Dr Marca Ancona Dr. Marcos Eke, NP Robbie Lis, [place] [facility] [place], [place] Brynda Peon, NP Karle Plumber, PharmD   Please be sure to bring in all your medications bottles to every appointment.    Thank you for choosing [facility]    You are scheduled for a Cardiac Catheterization on Thursday, May 16 with Dr. Marca Ancona.  1. Please arrive at the [facility] (Main Entrance A) at [facility]: [address] [place], [place] [address] at 5:30 AM (This time is 2 hour(s) before your procedure to ensure your preparation). Free valet parking service is available. You will check in at ADMITTING. The support person will be asked to wait in the waiting room.  It is OK to have someone drop you off and come back when you are ready to be discharged.    Special note: Every effort is made to have your procedure done on time. Please understand that emergencies sometimes delay scheduled procedures.  2. Diet: Do not eat solid foods after midnight.  The patient may have clear liquids until 5am upon the day of the procedure.  3. Medication instructions in preparation for your procedure:  DO NOT take your Torsemide the  day of your procedure   Take only 13 units of insulin the night before your procedure. Do not take any insulin on the day of the procedure.  Do not take Diabetes Med Glucophage (Metformin) on the day of the procedure and HOLD 48 HOURS AFTER THE PROCEDURE.  On the morning of your procedure, take your Aspirin 81 mg and any morning medicines NOT listed above.  You may use sips of water.  5. Plan to go home the same day, you will only stay overnight if medically necessary. 6. Bring a current  list of your medications and current insurance cards. 7. You MUST have a responsible person to drive you home. 8. Someone MUST be with you the first 24 hours after you arrive home or your discharge will be delayed. 9. Please wear clothes that are easy to get on and off and wear slip-on shoes.  Thank you for allowing [place] to care for you!   -- [facility]

## 2023-02-08 NOTE — Progress Notes (Signed)
ITAMAR home sleep study given to patient, all instructions explained, waiver signed, and CLOUDPAT registration complete.  

## 2023-02-08 NOTE — Progress Notes (Signed)
ReDS Vest / Clip - 02/08/23 1400       ReDS Vest / Clip   Station Marker D    Ruler Value 33.5    ReDS Value Range Moderate volume overload    ReDS Actual Value 40

## 2023-02-08 NOTE — Progress Notes (Signed)
HEART & VASCULAR TRANSITION OF CARE CONSULT NOTE     Referring Physician: Dr. Cristal Deer  Primary Care: [place], Buren Kos, MD Primary Cardiologist: Parke Poisson, MD   HPI: Referred to clinic by Dr. Cristal Deer for heart failure consultation.   52 y/o male w/ h/o HFpEF, Nonobstructive CAD by LHC in 2023, Uncontrolled Type 2DM (Hgb A1c 11), HTN, HLD, CKD IIIb, obesity, anxiety and asthma. He has a family history of congential heart disease. His sister (7 years younger) had a congential defect and required corrective heart surgery at [facility] at age 90 (he is unsure of exact diagnosis).   Echo 03/2022 showed normal LVEF, 55-60%, and normal RV. LHC 6/23 showed mid nonobstructive CAD, 40% mLAD, 50% 2nd Diag and 25% Ramus lesion.   Recently admitted 4/24 w/ acute on chronic HFpEF and massive volume overload. Also hypertensive on admission. Denied CP. HS trop elevated to the 300s but flat (felt to be demand ischemia). Repeat echo showed EF 60-65%. RV was noted to be normal size w/ normal systolic function, however RVSP was noted to be moderately elevated at 49 mmHg. IVC was dilated w/ <50% respiratory variability, suggesting right atrial pressure of 15 mmHg. He was diuresed w/ IV Lasix and transitioned to PO torsemide. GDMT limited by baseline renal fx. He had previously been on Jardiance but this was discontinued (Scr bumped from 1.7-2.1).   Also of note, he was diagnosed w/ an acute LE DVT during admission, left peroneal vein. Treated w/ Eliquis.   He was discharged home on 01/24/23, D/c wt 218 lb. Referred to [facility] clinic.   He presents today for f/u. Still SOB w/ activity, NYHA Class III. Showing signs of volume excess w/ abdominal and LEE edema. Wt up since d/c from 218>>232 lb. ReDs 40%. Also reports early satiety.  He reports compliance w/ torsemide and notes brisk UOP. Watching fluid intake, keeping under 60 oz/day but admits to dietary indiscretion w/ sodium. Eats fast food often.    Also reports h/o snoring but has never had sleep study. Denies tobacco use.    Cardiac Testing   Echo 03/2022  EF 55-60%, RV normal   LHC 03/2022   2nd Diag lesion is 50% stenosed.   Mid LAD lesion is 40% stenosed.   Ramus lesion is 25% stenosed.   LV end diastolic pressure is severely elevated.  LVEDP 33 mm Hg.   There is no aortic valve stenosis.   Nonobstructive CAD.   Volume overload. Continue diuresis.   LV Systolic Pressure 147 mmHg  LV Diastolic Pressure 6 mmHg  LV EDP 32 mmHg  AOp Systolic Pressure 147 mmHg  AOp Diastolic Pressure 84 mmHg  AOp Mean Pressure 112 mmHg  LVp Systolic Pressure 147 mmHg  LVp Diastolic Pressure 5 mmHg  LVp EDP Pressure 33 mmHg    Echo 01/2023  1. Limited echo   2. Left ventricular ejection fraction, by estimation, is 60 to 65%. Left  ventricular ejection fraction by PLAX is 62 %. The left ventricle has  normal function. The left ventricle has no regional wall motion  abnormalities.   3. Right ventricular systolic function is normal. The right ventricular  size is normal. There is moderately elevated pulmonary artery systolic  pressure. The estimated right ventricular systolic pressure is 49.3 mmHg.   4. The aortic valve is tricuspid. Aortic valve regurgitation is not  visualized.   5. The inferior vena cava is dilated in size with <50% respiratory  variability, suggesting right atrial  pressure of 15 mmHg.   Comparison(s): Changes from prior study are noted. 03/17/2022: LVEF 55-60%.    Review of Systems: [y] = yes, [ ]  = no   General: Weight gain [ ] ; Weight loss [ ] ; Anorexia [ ] ; Fatigue [ ] ; Fever [ ] ; Chills [ ] ; Weakness [ ]   Cardiac: Chest pain/pressure [ ] ; Resting SOB [ ] ; Exertional SOB [ ] ; Orthopnea [ ] ; Pedal Edema [ ] ; Palpitations [ ] ; Syncope [ ] ; Presyncope [ ] ; Paroxysmal nocturnal dyspnea[ ]   Pulmonary: Cough [ ] ; Wheezing[ ] ; Hemoptysis[ ] ; Sputum [ ] ; Snoring [ ]   GI: Vomiting[ ] ; Dysphagia[ ] ; Melena[ ] ;  Hematochezia [ ] ; Heartburn[ ] ; Abdominal pain [ ] ; Constipation [ ] ; Diarrhea [ ] ; BRBPR [ ]   GU: Hematuria[ ] ; Dysuria [ ] ; Nocturia[ ]   Vascular: Pain in legs with walking [ ] ; Pain in feet with lying flat [ ] ; Non-healing sores [ ] ; Stroke [ ] ; TIA [ ] ; Slurred speech [ ] ;  Neuro: Headaches[ ] ; Vertigo[ ] ; Seizures[ ] ; Paresthesias[ ] ;Blurred vision [ ] ; Diplopia [ ] ; Vision changes [ ]   Ortho/Skin: Arthritis [ ] ; Joint pain [ ] ; Muscle pain [ ] ; Joint swelling [ ] ; Back Pain [ ] ; Rash [ ]   Psych: Depression[ ] ; Anxiety[ ]   Heme: Bleeding problems [ ] ; Clotting disorders [ ] ; Anemia [ ]   Endocrine: Diabetes [ ] ; Thyroid dysfunction[ ]    Past Medical History:  Diagnosis Date   Acute gangrenous appendicitis 09/25/2022   Anxiety    CHF (congestive heart failure) (HCC)    Depression    Diabetes mellitus without complication (HCC)    Hypertension     Current Outpatient Medications  Medication Sig Dispense Refill   albuterol (VENTOLIN HFA) 108 (90 Base) MCG/ACT inhaler Inhale 2 puffs into the lungs every 6 (six) hours as needed for wheezing or shortness of breath. 8 g 2   amLODipine (NORVASC) 5 MG tablet Take 1 tablet (5 mg total) by mouth daily. 90 tablet 1   apixaban (ELIQUIS) 5 MG TABS tablet Take 2 tablets twice daily until 01/27/23, then continue with one tablet twice daily starting on 01/28/23. 60 tablet 0   aspirin EC 81 MG tablet Take 1 tablet (81 mg total) by mouth daily. Swallow whole. 30 tablet 0   atorvastatin (LIPITOR) 80 MG tablet Take 1 tablet (80 mg total) by mouth daily. 30 tablet 0   blood glucose meter kit and supplies Dispense based on patient and insurance preference. Use up to four times daily as directed. (FOR ICD-10 E10.9, E11.9). 1 each 0   busPIRone (BUSPAR) 5 MG tablet Take 5 mg by mouth as needed (anxiety).     hydrALAZINE (APRESOLINE) 50 MG tablet Take 1 tablet (50 mg total) by mouth every 8 (eight) hours. 90 tablet 0   insulin glargine (LANTUS) 100 unit/mL  SOPN Inject 26 Units into the skin daily. (Patient taking differently: Inject 26 Units into the skin in the morning.) 15 mL 1   Insulin Pen Needle (PEN NEEDLES 3/16") 31G X 5 MM MISC 1 each by Does not apply route daily. 90 each 1   isosorbide mononitrate (IMDUR) 30 MG 24 hr tablet Take 1 tablet (30 mg total) by mouth daily. 30 tablet 0   metFORMIN (GLUCOPHAGE-XR) 500 MG 24 hr tablet Take 4 tablets (2,000 mg total) by mouth at bedtime. 360 tablet 1   metoprolol succinate (TOPROL-XL) 50 MG 24 hr tablet Take 50 mg by mouth daily. Take with or  immediately following a meal.     mupirocin ointment (BACTROBAN) 2 % Apply 1 Application topically 2 (two) times daily. 22 g 0   torsemide (DEMADEX) 20 MG tablet Take 2 tablets (40 mg total) by mouth daily. 60 tablet 0   No current facility-administered medications for this encounter.    No Known Allergies    Social History   Socioeconomic History   Marital status: Single    Spouse name: Not on file   Number of children: 0   Years of education: Not on file   Highest education level: High school graduate  Occupational History   Occupation: retail  Tobacco Use   Smoking status: Never   Smokeless tobacco: Never  Vaping Use   Vaping Use: Never used  Substance and Sexual Activity   Alcohol use: Yes    Comment: rarely   Drug use: Never   Sexual activity: Not on file  Other Topics Concern   Not on file  Social History Narrative   Not on file   Social Determinants of Health   Financial Resource Strain: Low Risk  (01/20/2023)   Overall Financial Resource Strain (CARDIA)    Difficulty of Paying Living Expenses: Not very hard  Food Insecurity: No Food Insecurity (01/19/2023)   Hunger Vital Sign    Worried About Running Out of Food in the Last Year: Never true    Ran Out of Food in the Last Year: Never true  Transportation Needs: No Transportation Needs (01/20/2023)   PRAPARE - Administrator, Civil Service (Medical): No    Lack of  Transportation (Non-Medical): No  Physical Activity: Not on file  Stress: Not on file  Social Connections: Not on file  Intimate Partner Violence: Not At Risk (01/19/2023)   Humiliation, Afraid, Rape, and Kick questionnaire    Fear of Current or Ex-Partner: No    Emotionally Abused: No    Physically Abused: No    Sexually Abused: No      Family History  Problem Relation Age of Onset   Heart failure Mother     Vitals:   02/08/23 1343  BP: (!) 150/84  Pulse: 89  SpO2: 95%  Weight: 105.2 kg (232 lb)    PHYSICAL EXAM: ReDs 40%  General:  chronically ill appearing. No respiratory difficulty HEENT: normal Neck: supple. JVD 10 cm. Carotids 2+ bilat; no bruits. No lymphadenopathy or thryomegaly appreciated. Cor: PMI nondisplaced. Regular rate & rhythm. No rubs, gallops or murmurs. Lungs: clear Abdomen: distended, soft, nontender, No hepatosplenomegaly. No bruits or masses. Good bowel sounds. Extremities: no cyanosis, clubbing, rash, 2+ b/l LE edema Neuro: alert & oriented x 3, cranial nerves grossly intact. moves all 4 extremities w/o difficulty. Affect pleasant.  ECG: NSR 87 bpm    ASSESSMENT & PLAN:  1. HFpEF/ Pulmonary HTN  - Echo 6/23 EF 55-60%, RV normal  - Echo 4/24 EF 60-65%. RV was noted to be normal size w/ normal systolic function, however RVSP was noted to be moderately elevated at 49 mmHg - NYHA Class III. Volume overloaded on exam - Increase torsemide to 40 mg bid  - Check f/u BMP. If SCr has improved/ stabilizes, will plan to restart Jardiance  - no spiro yet given CKD - Suspect RV dysfunction likely worse than echo reported. He needs further w/u for PH. Suspect primarily WHO Group 2, possibly 3. Also w/ family h/o congenital heart defect (sister required corrective surgery at age 94). Recommend RHC. D/w pt and he agrees  to procedure - recommend sleep study evaluation (h/o snoring) - check CTD serologies   2. CAD - mild-mod by cath 6/23, 40% mLAD, 50% 2nd  Diag and 25% Ramus lesion - denies CP  - continue medical therapy + aggressive risk factor modification BP goal < 120/80 LDL goal < 55 Hgb A1c Goal < 7.0  Needs Wt loss    3. Hypertension  - moderately elevated, 150/84, but has not yet taken meds today  - instructed to improve compliance w/ meds (not taking hydarl tid)  - check BMP today   4. LE DVT, Left - on Eliquis  - will follow w/ PCP   5. Obesity  - Body mass index is 35.28 kg/m.  - refer to pharmD for GLP1     Referred to HFSW (PCP, Medications, Transportation, ETOH Abuse, Drug Abuse, Insurance, Financial ): No  Refer to Pharmacy:  No Refer to Home Health: No Refer to [facility]: Yes (for completion of RHC and w/u of PH)  Refer to General Cardiology: Yes (shared care)   Follow up  w/ APP in [facility] after RHC.

## 2023-02-09 ENCOUNTER — Ambulatory Visit (INDEPENDENT_AMBULATORY_CARE_PROVIDER_SITE_OTHER): Payer: 59 | Admitting: Primary Care

## 2023-02-09 ENCOUNTER — Telehealth (HOSPITAL_COMMUNITY): Payer: Self-pay

## 2023-02-09 ENCOUNTER — Other Ambulatory Visit (HOSPITAL_COMMUNITY): Payer: Self-pay

## 2023-02-09 VITALS — BP 119/76 | HR 84 | Temp 97.7°F | Resp 16 | Wt 226.0 lb

## 2023-02-09 DIAGNOSIS — L600 Ingrowing nail: Secondary | ICD-10-CM

## 2023-02-09 DIAGNOSIS — Z1211 Encounter for screening for malignant neoplasm of colon: Secondary | ICD-10-CM

## 2023-02-09 DIAGNOSIS — E119 Type 2 diabetes mellitus without complications: Secondary | ICD-10-CM | POA: Diagnosis not present

## 2023-02-09 LAB — CYCLIC CITRUL PEPTIDE ANTIBODY, IGG/IGA: CCP Antibodies IgG/IgA: 6 units (ref 0–19)

## 2023-02-09 LAB — POCT GLYCOSYLATED HEMOGLOBIN (HGB A1C): HbA1c, POC (controlled diabetic range): 12.4 % — AB (ref 0.0–7.0)

## 2023-02-09 LAB — BASIC METABOLIC PANEL
Chloride: 96 mmol/L — ABNORMAL LOW (ref 98–111)
Creatinine, Ser: 2.07 mg/dL — ABNORMAL HIGH (ref 0.61–1.24)
Glucose, Bld: 515 mg/dL (ref 70–99)

## 2023-02-09 LAB — ANA: Anti Nuclear Antibody (ANA): POSITIVE — AB

## 2023-02-09 LAB — ANTI-SCLERODERMA ANTIBODY: Scleroderma (Scl-70) (ENA) Antibody, IgG: <0.2 AI (ref 0.0–0.9)

## 2023-02-09 LAB — ANCA TITERS
Atypical P-ANCA titer: <1:20 {titer}
C-ANCA: <1:20 {titer}
P-ANCA: <1:20 {titer}

## 2023-02-09 MED ORDER — INSULIN LISPRO (1 UNIT DIAL) 100 UNIT/ML (KWIKPEN)
5.0000 [IU] | PEN_INJECTOR | Freq: Three times a day (TID) | SUBCUTANEOUS | 11 refills | Status: DC
Start: 2023-02-09 — End: 2023-11-27
  Filled 2023-02-09: qty 3, 20d supply, fill #0
  Filled 2023-04-26: qty 3, 20d supply, fill #1
  Filled 2023-05-17 (×2): qty 3, 20d supply, fill #2
  Filled 2023-06-07: qty 3, 20d supply, fill #3
  Filled 2023-07-27: qty 3, 20d supply, fill #4
  Filled 2023-08-18 – 2023-08-21 (×3): qty 3, 20d supply, fill #5
  Filled 2023-09-20 – 2023-10-03 (×2): qty 3, 20d supply, fill #6
  Filled 2023-11-27: qty 3, 20d supply, fill #7

## 2023-02-09 MED ORDER — INSULIN GLARGINE 100 UNITS/ML SOLOSTAR PEN
34.0000 [IU] | PEN_INJECTOR | Freq: Every day | SUBCUTANEOUS | 1 refills | Status: DC
Start: 2023-02-09 — End: 2023-03-22
  Filled 2023-02-09: qty 9, 26d supply, fill #0

## 2023-02-09 NOTE — Telephone Encounter (Signed)
-----   Message from Allayne Butcher, [place] sent at 02/09/2023  9:59 AM EDT ----- Glucose levels dangerously high. Check w/ patient to see if he is taking his insulin and checking glucose levels at home. He needs to f/u w/ his PCP.   I will route results to PCP as well.

## 2023-02-09 NOTE — Patient Instructions (Signed)
Complications from uncontrolled diabetes -diabetic retinopathy leading to blindness, diabetic nephropathy leading to dialysis, decrease in circulation decrease in sores or wound healing which may lead to amputations and increase of heart attack and stroke 

## 2023-02-09 NOTE — Telephone Encounter (Signed)
Lab called with critical glucose of 515

## 2023-02-09 NOTE — Telephone Encounter (Signed)
Patient updated on lab levels. He will contact his PCP to discuss elevated glucose.

## 2023-02-09 NOTE — Progress Notes (Signed)
HFU A1C- 12.4

## 2023-02-09 NOTE — Progress Notes (Signed)
[facility]   Subjective:   Leroy Simmons is a 52 y.o. male presents for hospital follow up and establish care. He presented for shortness of breath and LE edema.  Admit date to the hospital was 01/18/23, patient was discharged from the hospital on 01/24/23, patient was admitted for:   Acute on chronic diastolic CHF (congestive heart failure), Essential hypertension, Acute kidney injury superimposed on CKD, Type 2 diabetes mellitus with hyperlipidemia,  Acute deep vein thrombosis (DVT) of left peroneal vein and Obesity (BMI 30-39.9). Patient has No headache, No chest pain, No abdominal pain - No Nausea, No new weakness tingling or numbness, No Cough - shortness of breath. He does have polyuria, polyphagia and he has diabetic retinopathy   Past Medical History:  Diagnosis Date   Acute gangrenous appendicitis 09/25/2022   Anxiety    CHF (congestive heart failure) (HCC)    Depression    Diabetes mellitus without complication (HCC)    Hypertension      No Known Allergies    Current Outpatient Medications on File Prior to Visit  Medication Sig Dispense Refill   albuterol (VENTOLIN HFA) 108 (90 Base) MCG/ACT inhaler Inhale 2 puffs into the lungs every 6 (six) hours as needed for wheezing or shortness of breath. 8 g 2   amLODipine (NORVASC) 5 MG tablet Take 1 tablet (5 mg total) by mouth daily. 90 tablet 1   apixaban (ELIQUIS) 5 MG TABS tablet Take 2 tablets twice daily until 01/27/23, then continue with one tablet twice daily starting on 01/28/23. 60 tablet 0   aspirin EC 81 MG tablet Take 1 tablet (81 mg total) by mouth daily. Swallow whole. 30 tablet 0   atorvastatin (LIPITOR) 80 MG tablet Take 1 tablet (80 mg total) by mouth daily. 30 tablet 0   blood glucose meter kit and supplies Dispense based on patient and insurance preference. Use up to four times daily as directed. (FOR ICD-10 E10.9, E11.9). 1 each 0   busPIRone (BUSPAR) 5 MG tablet Take 5 mg by mouth as needed  (anxiety).     hydrALAZINE (APRESOLINE) 50 MG tablet Take 1 tablet (50 mg total) by mouth every 8 (eight) hours. 90 tablet 0   insulin glargine (LANTUS) 100 unit/mL SOPN Inject 26 Units into the skin daily. (Patient taking differently: Inject 26 Units into the skin in the morning.) 15 mL 1   Insulin Pen Needle (PEN NEEDLES 3/16") 31G X 5 MM MISC 1 each by Does not apply route daily. 90 each 1   isosorbide mononitrate (IMDUR) 30 MG 24 hr tablet Take 1 tablet (30 mg total) by mouth daily. 30 tablet 0   metFORMIN (GLUCOPHAGE-XR) 500 MG 24 hr tablet Take 4 tablets (2,000 mg total) by mouth at bedtime. 360 tablet 1   metoprolol succinate (TOPROL-XL) 50 MG 24 hr tablet Take 50 mg by mouth daily. Take with or immediately following a meal.     mupirocin ointment (BACTROBAN) 2 % Apply 1 Application topically 2 (two) times daily. 22 g 0   torsemide (DEMADEX) 20 MG tablet Take 2 tablets (40 mg total) by mouth 2 (two) times daily. 120 tablet 5   No current facility-administered medications on file prior to visit.     Review of System: Comprehensive ROS Pertinent positive and negative noted in HPI    Objective:  Blood Pressure 119/76 (BP Location: Right Arm, Patient Position: Sitting, Cuff Size: Large)   Pulse 84   Temperature 97.7 F (36.5 C) (Oral)  Respiration 16   Weight 226 lb (102.5 kg)   Oxygen Saturation 96%   Body Mass Index 34.36 kg/m   Filed Weights   02/09/23 1558  Weight: 226 lb (102.5 kg)    Physical Exam: General Appearance: Well nourished,obese male in no apparent distress. Eyes: PERRLA, EOMs, conjunctiva no swelling or erythema Sinuses: No Frontal/maxillary tenderness ENT/Mouth: Ext aud canals clear, TMs without erythema, bulging. No erythema, swelling, or exudate on post pharynx.  Tonsils not swollen or erythematous. Hearing normal.  Neck: Supple, thyroid normal.  Respiratory: Respiratory effort normal, BS equal bilaterally without rales, rhonchi, wheezing or stridor.   Cardio: RRR with no MRGs. Brisk peripheral pulses without edema.  Abdomen: Soft, + BS.  Non tender, no guarding, rebound, hernias, masses. Lymphatics: Non tender without lymphadenopathy.  Musculoskeletal: Full ROM, 5/5 strength, normal gait.  Skin: Warm, dry without rashes, lesions, ecchymosis.  Neuro: Cranial nerves intact. Normal muscle tone, no cerebellar symptoms. Sensation intact.  Psych: Awake and oriented X 3, normal affect, Insight and Judgment appropriate.    Assessment:  Leroy Simmons was seen today for hospitalization follow-up and diabetes.  Diagnoses and all orders for this visit:  Type 2 diabetes mellitus without complication, without long-term current use of insulin (HCC) -     HgB A1c -     insulin lispro (HUMALOG) 100 UNIT/ML injection; Inject 0.05 mLs (5 Units total) into the skin 3 (three) times daily before meals. -     insulin glargine (LANTUS) 100 unit/mL SOPN; Inject 34 Units into the skin daily.  Onychocryptosis -     Ambulatory referral to Podiatry  Colon cancer screening -     Ambulatory referral to Gastroenterology   This note has been created with Dragon speech recognition software and smart Lobbyist. Any transcriptional errors are unintentional.   Grayce Sessions, NP 02/09/2023, 4:14 PM

## 2023-02-10 LAB — RHEUMATOID FACTOR: Rheumatoid fact SerPl-aCnc: <10 IU/mL (ref <14.0)

## 2023-02-14 ENCOUNTER — Other Ambulatory Visit (HOSPITAL_COMMUNITY): Payer: Self-pay

## 2023-02-14 ENCOUNTER — Encounter (HOSPITAL_BASED_OUTPATIENT_CLINIC_OR_DEPARTMENT_OTHER): Payer: Self-pay

## 2023-02-14 ENCOUNTER — Telehealth (HOSPITAL_COMMUNITY): Payer: Self-pay

## 2023-02-14 NOTE — Telephone Encounter (Signed)
Received Cardiac Clearance form from [facility] [facility] requesting patient be cleared for the following procedure Vitrectomy-left under IV sedation. Form was signed on 02/08/2023 and faxed 02/14/2023 to [phone] . Will update chart once clearance has been reviewed and signed by provider.

## 2023-02-15 ENCOUNTER — Encounter (HOSPITAL_BASED_OUTPATIENT_CLINIC_OR_DEPARTMENT_OTHER): Payer: 59

## 2023-02-15 ENCOUNTER — Ambulatory Visit: Payer: 59 | Admitting: Emergency Medicine

## 2023-02-16 ENCOUNTER — Encounter (HOSPITAL_COMMUNITY): Admission: RE | Payer: Self-pay | Source: Home / Self Care

## 2023-02-16 ENCOUNTER — Ambulatory Visit (HOSPITAL_COMMUNITY): Admission: RE | Admit: 2023-02-16 | Payer: 59 | Source: Home / Self Care | Admitting: Cardiology

## 2023-02-16 SURGERY — RIGHT HEART CATH
Anesthesia: LOCAL

## 2023-02-17 ENCOUNTER — Ambulatory Visit: Payer: 59 | Admitting: Pharmacist

## 2023-02-21 ENCOUNTER — Telehealth (HOSPITAL_COMMUNITY): Payer: Self-pay

## 2023-02-21 DIAGNOSIS — H4312 Vitreous hemorrhage, left eye: Secondary | ICD-10-CM | POA: Diagnosis not present

## 2023-02-21 NOTE — Telephone Encounter (Signed)
Left message for patient to call office to re schedule catheterization.

## 2023-02-22 ENCOUNTER — Encounter: Payer: Self-pay | Admitting: Podiatry

## 2023-03-01 ENCOUNTER — Encounter (HOSPITAL_COMMUNITY): Payer: 59

## 2023-03-06 ENCOUNTER — Ambulatory Visit: Payer: 59 | Admitting: Podiatry

## 2023-03-06 ENCOUNTER — Telehealth (HOSPITAL_BASED_OUTPATIENT_CLINIC_OR_DEPARTMENT_OTHER): Payer: Self-pay | Admitting: Family Medicine

## 2023-03-11 ENCOUNTER — Encounter (HOSPITAL_COMMUNITY): Payer: Self-pay

## 2023-03-11 ENCOUNTER — Other Ambulatory Visit: Payer: Self-pay

## 2023-03-11 ENCOUNTER — Emergency Department (HOSPITAL_COMMUNITY)
Admission: EM | Admit: 2023-03-11 | Discharge: 2023-03-12 | Disposition: A | Discharge status: Home / Self Care | Payer: 59 | Source: Home / Self Care | Attending: Emergency Medicine | Admitting: Emergency Medicine

## 2023-03-11 DIAGNOSIS — Z79899 Other long term (current) drug therapy: Secondary | ICD-10-CM | POA: Insufficient documentation

## 2023-03-11 DIAGNOSIS — L02212 Cutaneous abscess of back [any part, except buttock]: Secondary | ICD-10-CM | POA: Insufficient documentation

## 2023-03-11 DIAGNOSIS — Z7984 Long term (current) use of oral hypoglycemic drugs: Secondary | ICD-10-CM | POA: Insufficient documentation

## 2023-03-11 DIAGNOSIS — S31000A Unspecified open wound of lower back and pelvis without penetration into retroperitoneum, initial encounter: Secondary | ICD-10-CM | POA: Diagnosis not present

## 2023-03-11 DIAGNOSIS — Z21 Asymptomatic human immunodeficiency virus [HIV] infection status: Secondary | ICD-10-CM | POA: Insufficient documentation

## 2023-03-11 DIAGNOSIS — Z794 Long term (current) use of insulin: Secondary | ICD-10-CM | POA: Insufficient documentation

## 2023-03-11 DIAGNOSIS — L0291 Cutaneous abscess, unspecified: Secondary | ICD-10-CM

## 2023-03-11 DIAGNOSIS — Z7901 Long term (current) use of anticoagulants: Secondary | ICD-10-CM | POA: Insufficient documentation

## 2023-03-11 DIAGNOSIS — A419 Sepsis, unspecified organism: Secondary | ICD-10-CM | POA: Diagnosis not present

## 2023-03-11 DIAGNOSIS — E119 Type 2 diabetes mellitus without complications: Secondary | ICD-10-CM | POA: Insufficient documentation

## 2023-03-11 DIAGNOSIS — I509 Heart failure, unspecified: Secondary | ICD-10-CM | POA: Insufficient documentation

## 2023-03-11 DIAGNOSIS — I2510 Atherosclerotic heart disease of native coronary artery without angina pectoris: Secondary | ICD-10-CM | POA: Insufficient documentation

## 2023-03-11 NOTE — ED Triage Notes (Signed)
Pt states he has a painful boil on his mid back; noticed past week; denies fevers; states having difficulty sleeping and bending due to the tightness of the abscess; eschar appearance in center of abscess; no drainage noted

## 2023-03-12 ENCOUNTER — Emergency Department (HOSPITAL_COMMUNITY): Payer: 59

## 2023-03-12 DIAGNOSIS — S31000A Unspecified open wound of lower back and pelvis without penetration into retroperitoneum, initial encounter: Secondary | ICD-10-CM | POA: Diagnosis not present

## 2023-03-12 MED ORDER — SULFAMETHOXAZOLE-TRIMETHOPRIM 800-160 MG PO TABS: 1.0000 | ORAL_TABLET | Freq: Two times a day (BID) | ORAL | 0 refills | Status: DC

## 2023-03-12 MED ORDER — LIDOCAINE HCL (PF) 1 % IJ SOLN
5.0000 mL | Freq: Once | INTRAMUSCULAR | Status: AC
  Administered 2023-03-12: 5 mL
  Filled 2023-03-12: qty 5

## 2023-03-12 NOTE — ED Provider Notes (Signed)
[place] EM[facility]GENCY DEPARTMENT AT [facility] Provider Note   CSN: [id number] Arrival date & time: 03/11/23  1717     History  Chief Complaint  Patient presents with   Abscess    Leroy Simmons is a 52 y.o. male.  HPI   Patient with medical history including HIV, CHF, CAD, diabetes, presenting with complaints of a abscess.  Patient states that he was that he had an abscess about 1 week ago, states its on his thoracic spine, denies any drainage or discharge, states is become more painful more swollen, states that pain is worsened with bending or moving, states that he went to the [facility] department and they are concerned that abscess is deep.  No associate fever chills cough congestion he has no other complaints.    Home Medications Prior to Admission medications   Medication Sig Start Date End Date Taking? Authorizing Provider  sulfamethoxazole-trimethoprim (BACTRIM DS) 800-160 MG tablet Take 1 tablet by mouth 2 (two) times daily for 7 days. 03/12/23 03/19/23 Yes Carroll Sage, PA-C  albuterol (VENTOLIN HFA) 108 (90 Base) MCG/ACT inhaler Inhale 2 puffs into the lungs every 6 (six) hours as needed for wheezing or shortness of breath. 01/13/23   Leslye Peer, MD  amLODipine (NORVASC) 5 MG tablet Take 1 tablet (5 mg total) by mouth daily. 05/24/22   [place], Buren Kos, MD  apixaban Everlene Balls) 5 MG TABS tablet Take 2 tablets twice daily until 01/27/23, then continue with one tablet twice daily starting on 01/28/23. 01/24/23   Arrien, York Ram, MD  atorvastatin (LIPITOR) 80 MG tablet Take 1 tablet (80 mg total) by mouth daily. 01/25/23 02/24/23  Arrien, York Ram, MD  blood glucose meter kit and supplies Dispense based on patient and insurance preference. Use up to four times daily as directed. (FOR ICD-10 E10.9, E11.9). 05/24/22   [place], Buren Kos, MD  busPIRone (BUSPAR) 5 MG tablet Take 5 mg by mouth as needed (anxiety).    [provider]  hydrALAZINE  (APRESOLINE) 50 MG tablet Take 1 tablet (50 mg total) by mouth every 8 (eight) hours. 01/24/23 02/23/23  Arrien, York Ram, MD  insulin glargine (LANTUS) 100 unit/mL SOPN Inject 34 Units into the skin daily. 02/09/23   Grayce Sessions, NP  insulin lispro (HUMALOG KWIKPEN) 100 UNIT/ML KwikPen Inject 5 Units into the skin 3 (three) times daily before meals. 02/09/23   Grayce Sessions, NP  Insulin Pen Needle (PEN NEEDLES 3/16") 31G X 5 MM MISC 1 each by Does not apply route daily. 01/18/22   de [place], Raymond J, MD  isosorbide mononitrate (IMDUR) 30 MG 24 hr tablet Take 1 tablet (30 mg total) by mouth daily. 01/24/23 02/23/23  Arrien, York Ram, MD  metFORMIN (GLUCOPHAGE-XR) 500 MG 24 hr tablet Take 4 tablets (2,000 mg total) by mouth at bedtime. 05/24/22   [place], Buren Kos, MD  metoprolol succinate (TOPROL-XL) 50 MG 24 hr tablet Take 50 mg by mouth daily. Take with or immediately following a meal.    [provider]  mupirocin ointment (BACTROBAN) 2 % Apply 1 Application topically 2 (two) times daily. 12/22/22   Fisher, Roselyn Bering, PA-C  torsemide (DEMADEX) 20 MG tablet Take 2 tablets (40 mg total) by mouth 2 (two) times daily. 02/08/23   Allayne Butcher, PA-C      Allergies    Patient has no known allergies.    Review of Systems   Review of Systems  Constitutional:  Negative for chills and fever.  Respiratory:  Negative for shortness of breath.   Cardiovascular:  Negative for chest pain.  Gastrointestinal:  Negative for abdominal pain.  Skin:  Positive for wound.  Neurological:  Negative for headaches.    Physical Exam Updated Vital Signs BP 139/79 (BP Location: Left Arm)   Pulse 93   Temp 99.2 F (37.3 C) (Oral)   Resp 17   SpO2 96%  Physical Exam Vitals and nursing note reviewed.  Constitutional:      General: He is not in acute distress.    Appearance: Normal appearance. He is not ill-appearing or diaphoretic.  HENT:     Head: Normocephalic and atraumatic.   Eyes:     Conjunctiva/sclera: Conjunctivae normal.  Cardiovascular:     Rate and Rhythm: Normal rate and regular rhythm.  Pulmonary:     Effort: Pulmonary effort is normal.  Skin:    General: Skin is warm and dry.     Comments: Visualized patient's back there is a small papule noted on his thoracic spine, about the size of a marble, eschar in the center of it, there is some surrounding erythema, no drainage or discharge present, there is induration present without notable fluctuance.  Please see picture for full detail  Neurological:     Mental Status: He is alert.     Comments: No facial asymmetry no difficulty with word finding following two-step commands there is no unilateral weakness present.  Psychiatric:        Mood and Affect: Mood normal.     ED Results / Procedures / Treatments   Labs (all labs ordered are listed, but only abnormal results are displayed) Labs Reviewed - No data to display  EKG None  Radiology DG Thoracic Spine 2 View  Result Date: 03/12/2023 CLINICAL DATA:  Back wound EXAM: THORACIC SPINE 2 VIEWS COMPARISON:  None Available. FINDINGS: There is no evidence of thoracic spine fracture. Alignment is normal. No other significant bone abnormalities are identified. IMPRESSION: Negative. Electronically Signed   By: Helyn Numbers M.D.   On: 03/12/2023 01:32    Procedures .Marland KitchenIncision and Drainage  Date/Time: 03/12/2023 2:58 AM  Performed by: Carroll Sage, PA-C Authorized by: Carroll Sage, PA-C   Consent:    Consent obtained:  Verbal   Consent given by:  Patient   Risks, benefits, and alternatives were discussed: yes     Risks discussed:  Bleeding, incomplete drainage, pain, damage to other organs and infection   Alternatives discussed:  No treatment Universal protocol:    Patient identity confirmed:  Verbally with patient Location:    Type:  Abscess   Size:  1cm   Location:  Trunk   Trunk location:  Back Pre-procedure details:    Skin  preparation:  Antiseptic wash Sedation:    Sedation type:  None Anesthesia:    Anesthesia method:  Local infiltration   Local anesthetic:  Lidocaine 1% w/o epi Procedure type:    Complexity:  Simple Procedure details:    Ultrasound guidance: no     Needle aspiration: no     Incision types:  Stab incision   Incision depth:  Subcutaneous   Drainage:  Bloody   Drainage amount:  Scant   Packing materials:  None Post-procedure details:    Procedure completion:  Tolerated well, no immediate complications     Medications Ordered in ED Medications  lidocaine (PF) (XYLOCAINE) 1 % injection 5 mL (has no administration in time range)  ED Course/ Medical Decision Making/ A&P                             Medical Decision Making Amount and/or Complexity of Data Reviewed Radiology: ordered.  Risk Prescription drug management.   This patient presents to the ED for concern of abscess, this involves an extensive number of treatment options, and is a complaint that carries with it a high risk of complications and morbidity.  The differential diagnosis includes necrotizing fasciitis, abscess, cellulitis    Additional history obtained:  Additional history obtained from N/A External records from outside source obtained and reviewed including recent hospitalization   Co morbidities that complicate the patient evaluation  CHF, HIV, diabetes  Social Determinants of Health:  Poor medication adherence    Lab Tests:  I Ordered, and personally interpreted labs.  The pertinent results include: N/A   Imaging Studies ordered:  I ordered imaging studies including plain film of the thoracic spine I independently visualized and interpreted imaging which showed N/A I agree with the radiologist interpretation   Cardiac Monitoring:  The patient was maintained on a cardiac monitor.  I personally viewed and interpreted the cardiac monitored which showed an underlying rhythm of:  N/A   Medicines ordered and prescription drug management:  I ordered medication including lidocaine I have reviewed the patients home medicines and have made adjustments as needed  Critical Interventions:  N/A   Reevaluation:  With an abscess, suspect this is likely superficial but will obtain imaging to exclude deep tissue infection.  X-rays unremarkable discussed with patient I&D plus antibiotics versus just antibiotics, I explained that if there is a small abscess can be better if I&D was performed, patient agreed to I&D.  He tolerated well and is agreement discharge at this time      Consultations Obtained:  N/a    Test Considered:  N/a    Rule out Doubt necrotizing fasciitis presentation atypical no necrotic skin patient is nontoxic-appearing.  I doubt deep tissue infection causing possible osteomyelitis/epidural abscess is appears very superficial on my exam, there is no focal deficits, imaging is reassuring    Dispostion and problem list  After consideration of the diagnostic results and the patients response to treatment, I feel that the patent would benefit from discharge.  Abscess-will start antibiotics, recommend symptom management, follow-up with PCP for further evaluation and strict turn precautions.            Final Clinical Impression(s) / ED Diagnoses Final diagnoses:  Abscess    Rx / DC Orders ED Discharge Orders          Ordered    sulfamethoxazole-trimethoprim (BACTRIM DS) 800-160 MG tablet  2 times daily        03/12/23 0302              Carroll Sage, PA-C 03/12/23 0305    Glynn Octave, MD 03/12/23 [phone]

## 2023-03-12 NOTE — ED Notes (Signed)
New dressing placed on wound.  Pt given supplies to change at home.

## 2023-03-12 NOTE — Discharge Instructions (Signed)
Likely you have an abscess which is draining, I have started antibiotics please take as prescribed.  I recommend applying warm compresses to the area twice daily to help bring out the infection.  Please rinse it twice daily and apply new dressings.  May use over-the-counter pain medication as needed.  If you note that after 3 days of antibiotic use your symptoms are not improving, you have increased pain, increased redness, have associated fevers chills numbness, weakness in the upper or lower extremities please come back in for reassessment.

## 2023-03-13 ENCOUNTER — Emergency Department (HOSPITAL_COMMUNITY): Payer: 59

## 2023-03-13 ENCOUNTER — Other Ambulatory Visit: Payer: Self-pay

## 2023-03-13 ENCOUNTER — Inpatient Hospital Stay (HOSPITAL_COMMUNITY)
Admission: EM | Admit: 2023-03-13 | Discharge: 2023-03-22 | DRG: 871 | Disposition: A | Discharge status: Home / Self Care | Payer: 59 | Attending: Family Medicine | Admitting: Family Medicine

## 2023-03-13 ENCOUNTER — Encounter (HOSPITAL_COMMUNITY): Payer: Self-pay

## 2023-03-13 DIAGNOSIS — I272 Pulmonary hypertension, unspecified: Secondary | ICD-10-CM | POA: Diagnosis not present

## 2023-03-13 DIAGNOSIS — F32A Depression, unspecified: Secondary | ICD-10-CM | POA: Diagnosis not present

## 2023-03-13 DIAGNOSIS — F419 Anxiety disorder, unspecified: Secondary | ICD-10-CM | POA: Diagnosis not present

## 2023-03-13 DIAGNOSIS — I130 Hypertensive heart and chronic kidney disease with heart failure and stage 1 through stage 4 chronic kidney disease, or unspecified chronic kidney disease: Secondary | ICD-10-CM | POA: Diagnosis present

## 2023-03-13 DIAGNOSIS — E1122 Type 2 diabetes mellitus with diabetic chronic kidney disease: Secondary | ICD-10-CM | POA: Diagnosis present

## 2023-03-13 DIAGNOSIS — L0390 Cellulitis, unspecified: Secondary | ICD-10-CM

## 2023-03-13 DIAGNOSIS — G061 Intraspinal abscess and granuloma: Secondary | ICD-10-CM | POA: Diagnosis not present

## 2023-03-13 DIAGNOSIS — E1165 Type 2 diabetes mellitus with hyperglycemia: Secondary | ICD-10-CM

## 2023-03-13 DIAGNOSIS — T502X5A Adverse effect of carbonic-anhydrase inhibitors, benzothiadiazides and other diuretics, initial encounter: Secondary | ICD-10-CM | POA: Diagnosis not present

## 2023-03-13 DIAGNOSIS — Z794 Long term (current) use of insulin: Secondary | ICD-10-CM

## 2023-03-13 DIAGNOSIS — I5032 Chronic diastolic (congestive) heart failure: Secondary | ICD-10-CM | POA: Diagnosis present

## 2023-03-13 DIAGNOSIS — E669 Obesity, unspecified: Secondary | ICD-10-CM | POA: Diagnosis present

## 2023-03-13 DIAGNOSIS — R911 Solitary pulmonary nodule: Secondary | ICD-10-CM | POA: Diagnosis not present

## 2023-03-13 DIAGNOSIS — I5030 Unspecified diastolic (congestive) heart failure: Secondary | ICD-10-CM | POA: Diagnosis not present

## 2023-03-13 DIAGNOSIS — E1151 Type 2 diabetes mellitus with diabetic peripheral angiopathy without gangrene: Secondary | ICD-10-CM | POA: Diagnosis not present

## 2023-03-13 DIAGNOSIS — Z5982 Transportation insecurity: Secondary | ICD-10-CM

## 2023-03-13 DIAGNOSIS — D649 Anemia, unspecified: Secondary | ICD-10-CM | POA: Diagnosis not present

## 2023-03-13 DIAGNOSIS — I2510 Atherosclerotic heart disease of native coronary artery without angina pectoris: Secondary | ICD-10-CM | POA: Diagnosis present

## 2023-03-13 DIAGNOSIS — N179 Acute kidney failure, unspecified: Secondary | ICD-10-CM | POA: Diagnosis not present

## 2023-03-13 DIAGNOSIS — R768 Other specified abnormal immunological findings in serum: Secondary | ICD-10-CM | POA: Diagnosis present

## 2023-03-13 DIAGNOSIS — Z7901 Long term (current) use of anticoagulants: Secondary | ICD-10-CM

## 2023-03-13 DIAGNOSIS — N1832 Chronic kidney disease, stage 3b: Secondary | ICD-10-CM | POA: Diagnosis present

## 2023-03-13 DIAGNOSIS — I252 Old myocardial infarction: Secondary | ICD-10-CM

## 2023-03-13 DIAGNOSIS — Z7984 Long term (current) use of oral hypoglycemic drugs: Secondary | ICD-10-CM

## 2023-03-13 DIAGNOSIS — Z8249 Family history of ischemic heart disease and other diseases of the circulatory system: Secondary | ICD-10-CM

## 2023-03-13 DIAGNOSIS — R739 Hyperglycemia, unspecified: Secondary | ICD-10-CM | POA: Diagnosis not present

## 2023-03-13 DIAGNOSIS — E785 Hyperlipidemia, unspecified: Secondary | ICD-10-CM | POA: Diagnosis not present

## 2023-03-13 DIAGNOSIS — A419 Sepsis, unspecified organism: Secondary | ICD-10-CM | POA: Diagnosis not present

## 2023-03-13 DIAGNOSIS — Z79899 Other long term (current) drug therapy: Secondary | ICD-10-CM

## 2023-03-13 DIAGNOSIS — L03312 Cellulitis of back [any part except buttock]: Secondary | ICD-10-CM | POA: Diagnosis not present

## 2023-03-13 DIAGNOSIS — Z7982 Long term (current) use of aspirin: Secondary | ICD-10-CM

## 2023-03-13 DIAGNOSIS — L02212 Cutaneous abscess of back [any part, except buttock]: Secondary | ICD-10-CM | POA: Diagnosis present

## 2023-03-13 DIAGNOSIS — K5900 Constipation, unspecified: Secondary | ICD-10-CM | POA: Diagnosis present

## 2023-03-13 DIAGNOSIS — Z91199 Patient's noncompliance with other medical treatment and regimen due to unspecified reason: Secondary | ICD-10-CM

## 2023-03-13 DIAGNOSIS — R1084 Generalized abdominal pain: Secondary | ICD-10-CM | POA: Diagnosis not present

## 2023-03-13 DIAGNOSIS — R079 Chest pain, unspecified: Secondary | ICD-10-CM | POA: Diagnosis not present

## 2023-03-13 DIAGNOSIS — N170 Acute kidney failure with tubular necrosis: Secondary | ICD-10-CM | POA: Diagnosis not present

## 2023-03-13 DIAGNOSIS — J45909 Unspecified asthma, uncomplicated: Secondary | ICD-10-CM | POA: Diagnosis present

## 2023-03-13 DIAGNOSIS — Z86718 Personal history of other venous thrombosis and embolism: Secondary | ICD-10-CM

## 2023-03-13 DIAGNOSIS — R109 Unspecified abdominal pain: Secondary | ICD-10-CM | POA: Diagnosis not present

## 2023-03-13 DIAGNOSIS — E876 Hypokalemia: Secondary | ICD-10-CM | POA: Diagnosis not present

## 2023-03-13 DIAGNOSIS — E871 Hypo-osmolality and hyponatremia: Secondary | ICD-10-CM | POA: Diagnosis not present

## 2023-03-13 DIAGNOSIS — E119 Type 2 diabetes mellitus without complications: Secondary | ICD-10-CM

## 2023-03-13 DIAGNOSIS — Z6835 Body mass index (BMI) 35.0-35.9, adult: Secondary | ICD-10-CM

## 2023-03-13 DIAGNOSIS — E1169 Type 2 diabetes mellitus with other specified complication: Secondary | ICD-10-CM | POA: Diagnosis not present

## 2023-03-13 DIAGNOSIS — I152 Hypertension secondary to endocrine disorders: Secondary | ICD-10-CM | POA: Diagnosis present

## 2023-03-13 DIAGNOSIS — I214 Non-ST elevation (NSTEMI) myocardial infarction: Secondary | ICD-10-CM

## 2023-03-13 DIAGNOSIS — I82452 Acute embolism and thrombosis of left peroneal vein: Secondary | ICD-10-CM | POA: Diagnosis present

## 2023-03-13 DIAGNOSIS — R0789 Other chest pain: Secondary | ICD-10-CM | POA: Diagnosis not present

## 2023-03-13 DIAGNOSIS — D631 Anemia in chronic kidney disease: Secondary | ICD-10-CM | POA: Diagnosis not present

## 2023-03-13 DIAGNOSIS — N183 Chronic kidney disease, stage 3 unspecified: Secondary | ICD-10-CM | POA: Diagnosis not present

## 2023-03-13 DIAGNOSIS — E1159 Type 2 diabetes mellitus with other circulatory complications: Secondary | ICD-10-CM

## 2023-03-13 DIAGNOSIS — N1831 Chronic kidney disease, stage 3a: Secondary | ICD-10-CM | POA: Diagnosis present

## 2023-03-13 DIAGNOSIS — I517 Cardiomegaly: Secondary | ICD-10-CM | POA: Diagnosis not present

## 2023-03-13 LAB — CBC
HCT: 33.7 % — ABNORMAL LOW (ref 39.0–52.0)
Hemoglobin: 11.2 g/dL — ABNORMAL LOW (ref 13.0–17.0)
MCH: 29.7 pg (ref 26.0–34.0)
MCHC: 33.2 g/dL (ref 30.0–36.0)
MCV: 89.4 fL (ref 80.0–100.0)
Platelets: 247 10*3/uL (ref 150–400)
RBC: 3.77 MIL/uL — ABNORMAL LOW (ref 4.22–5.81)
RDW: 12.2 % (ref 11.5–15.5)
WBC: 15.7 10*3/uL — ABNORMAL HIGH (ref 4.0–10.5)
nRBC: 0 % (ref 0.0–0.2)

## 2023-03-13 LAB — BASIC METABOLIC PANEL
Anion gap: 16 — ABNORMAL HIGH (ref 5–15)
BUN: 21 mg/dL — ABNORMAL HIGH (ref 6–20)
CO2: 25 mmol/L (ref 22–32)
Calcium: 8.9 mg/dL (ref 8.9–10.3)
Chloride: 96 mmol/L — ABNORMAL LOW (ref 98–111)
Creatinine, Ser: 1.6 mg/dL — ABNORMAL HIGH (ref 0.61–1.24)
GFR, Estimated: 52 mL/min — ABNORMAL LOW (ref >60)
Glucose, Bld: 402 mg/dL — ABNORMAL HIGH (ref 70–99)
Potassium: 3.7 mmol/L (ref 3.5–5.1)
Sodium: 137 mmol/L (ref 135–145)

## 2023-03-13 LAB — I-STAT VENOUS BLOOD GAS, ED
Acid-Base Excess: 1 mmol/L (ref 0.0–2.0)
Bicarbonate: 28.6 mmol/L — ABNORMAL HIGH (ref 20.0–28.0)
Calcium, Ion: 1.19 mmol/L (ref 1.15–1.40)
HCT: 31 % — ABNORMAL LOW (ref 39.0–52.0)
Hemoglobin: 10.5 g/dL — ABNORMAL LOW (ref 13.0–17.0)
O2 Saturation: 85 %
Potassium: 3.7 mmol/L (ref 3.5–5.1)
Sodium: 137 mmol/L (ref 135–145)
TCO2: 30 mmol/L (ref 22–32)
pCO2, Ven: 59.8 mmHg (ref 44–60)
pH, Ven: 7.287 (ref 7.25–7.43)
pO2, Ven: 58 mmHg — ABNORMAL HIGH (ref 32–45)

## 2023-03-13 LAB — TROPONIN I (HIGH SENSITIVITY)
Troponin I (High Sensitivity): 47 ng/L — ABNORMAL HIGH (ref <18)
Troponin I (High Sensitivity): 43 ng/L — ABNORMAL HIGH (ref <18)

## 2023-03-13 LAB — CBG MONITORING, ED: Glucose-Capillary: 278 mg/dL — ABNORMAL HIGH (ref 70–99)

## 2023-03-13 LAB — LACTIC ACID, PLASMA: Lactic Acid, Venous: 0.9 mmol/L (ref 0.5–1.9)

## 2023-03-13 LAB — BRAIN NATRIURETIC PEPTIDE: B Natriuretic Peptide: 293.2 pg/mL — ABNORMAL HIGH (ref 0.0–100.0)

## 2023-03-13 MED ORDER — SODIUM CHLORIDE 0.9 % IV BOLUS
500.0000 mL | Freq: Once | INTRAVENOUS | Status: AC
  Administered 2023-03-13: 500 mL via INTRAVENOUS

## 2023-03-13 MED ORDER — DOXYCYCLINE HYCLATE 100 MG PO TABS
100.0000 mg | ORAL_TABLET | Freq: Once | ORAL | Status: AC
  Administered 2023-03-13: 100 mg via ORAL
  Filled 2023-03-13: qty 1

## 2023-03-13 MED ORDER — MORPHINE SULFATE (PF) 4 MG/ML IV SOLN
4.0000 mg | Freq: Once | INTRAVENOUS | Status: AC
  Administered 2023-03-13: 4 mg via INTRAVENOUS
  Filled 2023-03-13: qty 1

## 2023-03-13 MED ORDER — IOHEXOL 350 MG/ML SOLN
50.0000 mL | Freq: Once | INTRAVENOUS | Status: AC | PRN
  Administered 2023-03-13: 50 mL via INTRAVENOUS

## 2023-03-13 MED ORDER — ONDANSETRON HCL 4 MG/2ML IJ SOLN
4.0000 mg | Freq: Once | INTRAMUSCULAR | Status: AC
  Administered 2023-03-13: 4 mg via INTRAVENOUS
  Filled 2023-03-13: qty 2

## 2023-03-13 MED ORDER — INSULIN ASPART 100 UNIT/ML IJ SOLN
6.0000 [IU] | Freq: Once | INTRAMUSCULAR | Status: AC
  Administered 2023-03-13: 6 [IU] via INTRAVENOUS

## 2023-03-13 MED ORDER — SODIUM CHLORIDE 0.9 % IV SOLN
1.0000 g | Freq: Once | INTRAVENOUS | Status: DC
  Administered 2023-03-14: 1 g via INTRAVENOUS
  Filled 2023-03-13: qty 10

## 2023-03-13 MED ORDER — ACETAMINOPHEN 325 MG PO TABS
650.0000 mg | ORAL_TABLET | Freq: Once | ORAL | Status: AC
  Administered 2023-03-13: 650 mg via ORAL
  Filled 2023-03-13: qty 2

## 2023-03-13 NOTE — Assessment & Plan Note (Signed)
Noted on 01/19/2023.  Continue Eliquis.

## 2023-03-13 NOTE — ED Provider Notes (Incomplete)
[place] EMERGENCY DEPARTMENT AT [facility] Provider Note   CSN: [id number] Arrival date & time: 03/13/23  1613     History {Add pertinent medical, surgical, social history, OB history to HPI:1} Chief Complaint  Patient presents with  . Abdominal Pain  . Abscess  . Back Pain    Leroy Simmons is a 52 y.o. male with past medical history significant for pulmonary hypertension, obesity, anxiety, depression, hypertension, diabetes, previous NSTEMI, CHF, cardiomegaly, diabetes 2 with current insulin requirement who presents with concern for ongoing back pain secondary to known abscess, chest pain, shortness of breath.  Patient was seen and evaluated in the emergency department 2 days ago, I&D performed of abscess, patient reports that he has not been able to pick up any antibiotics.  Patient reports that today he started having constant left-sided chest pain.  He denies worse with exertion.  He denies significant improvement with nitroglycerin.  He was not hypoxic at the scene, SpO2 min at 92, but he was placed on oxygen for comfort.  Patient reports that he has significant 9/10 pain at this time.  He denies vomiting, but does endorse some nausea.   Abdominal Pain Abscess Back Pain Associated symptoms: abdominal pain        Home Medications Prior to Admission medications   Medication Sig Start Date End Date Taking? Authorizing Provider  albuterol (VENTOLIN HFA) 108 (90 Base) MCG/ACT inhaler Inhale 2 puffs into the lungs every 6 (six) hours as needed for wheezing or shortness of breath. 01/13/23   Leslye Peer, MD  amLODipine (NORVASC) 5 MG tablet Take 1 tablet (5 mg total) by mouth daily. 05/24/22   [place], Buren Kos, MD  apixaban Everlene Balls) 5 MG TABS tablet Take 2 tablets twice daily until 01/27/23, then continue with one tablet twice daily starting on 01/28/23. 01/24/23   Arrien, York Ram, MD  atorvastatin (LIPITOR) 80 MG tablet Take 1 tablet (80 mg total) by mouth  daily. 01/25/23 02/24/23  Arrien, York Ram, MD  blood glucose meter kit and supplies Dispense based on patient and insurance preference. Use up to four times daily as directed. (FOR ICD-10 E10.9, E11.9). 05/24/22   [place], Buren Kos, MD  busPIRone (BUSPAR) 5 MG tablet Take 5 mg by mouth as needed (anxiety).    [provider]  hydrALAZINE (APRESOLINE) 50 MG tablet Take 1 tablet (50 mg total) by mouth every 8 (eight) hours. 01/24/23 02/23/23  Arrien, York Ram, MD  insulin glargine (LANTUS) 100 unit/mL SOPN Inject 34 Units into the skin daily. 02/09/23   Grayce Sessions, NP  insulin lispro (HUMALOG KWIKPEN) 100 UNIT/ML KwikPen Inject 5 Units into the skin 3 (three) times daily before meals. 02/09/23   Grayce Sessions, NP  Insulin Pen Needle (PEN NEEDLES 3/16") 31G X 5 MM MISC 1 each by Does not apply route daily. 01/18/22   de [place], Raymond J, MD  isosorbide mononitrate (IMDUR) 30 MG 24 hr tablet Take 1 tablet (30 mg total) by mouth daily. 01/24/23 02/23/23  Arrien, York Ram, MD  metFORMIN (GLUCOPHAGE-XR) 500 MG 24 hr tablet Take 4 tablets (2,000 mg total) by mouth at bedtime. 05/24/22   [place], Buren Kos, MD  metoprolol succinate (TOPROL-XL) 50 MG 24 hr tablet Take 50 mg by mouth daily. Take with or immediately following a meal.    [provider]  mupirocin ointment (BACTROBAN) 2 % Apply 1 Application topically 2 (two) times daily. 12/22/22   Faythe Ghee,  PA-C  sulfamethoxazole-trimethoprim (BACTRIM DS) 800-160 MG tablet Take 1 tablet by mouth 2 (two) times daily for 7 days. 03/12/23 03/19/23  Carroll Sage, PA-C  torsemide (DEMADEX) 20 MG tablet Take 2 tablets (40 mg total) by mouth 2 (two) times daily. 02/08/23   Allayne Butcher, PA-C      Allergies    Patient has no known allergies.    Review of Systems   Review of Systems  Gastrointestinal:  Positive for abdominal pain.  Musculoskeletal:  Positive for back pain.  All other systems reviewed and  are negative.   Physical Exam Updated Vital Signs BP (!) 147/85 (BP Location: Right Arm)   Pulse 99   Temp 98.9 F (37.2 C) (Oral)   Resp 16   Ht 5' 7.5" (1.715 m)   Wt 104.3 kg   SpO2 100%   BMI 35.49 kg/m  Physical Exam Vitals and nursing note reviewed.  Constitutional:      General: He is not in acute distress.    Appearance: Normal appearance.  HENT:     Head: Normocephalic and atraumatic.  Eyes:     General:        Right eye: No discharge.        Left eye: No discharge.  Cardiovascular:     Rate and Rhythm: Normal rate and regular rhythm.     Heart sounds: No murmur heard.    No friction rub. No gallop.  Pulmonary:     Effort: Pulmonary effort is normal.     Breath sounds: Normal breath sounds.  Abdominal:     General: Bowel sounds are normal.     Palpations: Abdomen is soft.  Skin:    General: Skin is warm and dry.     Capillary Refill: Capillary refill takes less than 2 seconds.     Comments: Abscess noted of back is approximately similar appearance has images from 1 day ago, possible slightly increased redness on my exam.  Neurological:     Mental Status: He is alert and oriented to person, place, and time.  Psychiatric:        Mood and Affect: Mood normal.        Behavior: Behavior normal.     ED Results / Procedures / Treatments   Labs (all labs ordered are listed, but only abnormal results are displayed) Labs Reviewed  BASIC METABOLIC PANEL  CBC  BRAIN NATRIURETIC PEPTIDE  TROPONIN I (HIGH SENSITIVITY)    EKG None  Radiology DG Thoracic Spine 2 View  Result Date: 03/12/2023 CLINICAL DATA:  Back wound EXAM: THORACIC SPINE 2 VIEWS COMPARISON:  None Available. FINDINGS: There is no evidence of thoracic spine fracture. Alignment is normal. No other significant bone abnormalities are identified. IMPRESSION: Negative. Electronically Signed   By: Helyn Numbers M.D.   On: 03/12/2023 01:32    Procedures Procedures  {Document cardiac monitor,  telemetry assessment procedure when appropriate:1}  Medications Ordered in ED Medications  morphine (PF) 4 MG/ML injection 4 mg (has no administration in time range)  ondansetron (ZOFRAN) injection 4 mg (has no administration in time range)  doxycycline (VIBRA-TABS) tablet 100 mg (has no administration in time range)    ED Course/ Medical Decision Making/ A&P   {   Click here for ABCD2, HEART and other calculatorsREFRESH Note before signing :1}                          Medical Decision Making Amount  and/or Complexity of Data Reviewed Labs: ordered. Radiology: ordered.  Risk OTC drugs. Prescription drug management. Decision regarding hospitalization.   This patient is a 53 y.o. male  who presents to the ED for concern of chest pain, shortness of breath, and ongoing back abscess, as well as fever, chills.   Differential diagnoses prior to evaluation: The emergent differential diagnosis includes, but is not limited to worsening headache, discitis, osteomyelitis, penetrating abscess, ACS, AAS, PE, Mallory-Weiss, Boerhaave's, Pneumonia, acute bronchitis, asthma or COPD exacerbation, anxiety, MSK pain or traumatic injury to the chest, acid reflux versus other. This is not an exhaustive differential.   Past Medical History / Co-morbidities / Social History: HTN, asthma, previous sepsis 2/2 cellulitis, CKD stage 3  Additional history: Chart reviewed. Pertinent results include: Reviewed lab work, imaging from recent previous emergency department visits, outpatient cardiology,  Physical Exam: Physical exam performed. The pertinent findings include: Abscess on back not significantly worsened from the pictures last night, but tender, red.  He has no other he has no weakness of upper or lower extremities.  He does have a fever, Tmax in the emergency department 101.4.  He has had some intermittent tachycardia, pulse max 103.  He is somewhat hypertensive,  Lab Tests/Imaging studies: I  personally interpreted labs/imaging and the pertinent results include:  ***. ***I agree with the radiologist interpretation.  Cardiac monitoring: EKG obtained and interpreted by my attending physician which shows: ***   Medications: I ordered medication including ***.  I have reviewed the patients home medicines and have made adjustments as needed.   Disposition: After consideration of the diagnostic results and the patients response to treatment, I feel that *** .   ***emergency department workup does not suggest an emergent condition requiring admission or immediate intervention beyond what has been performed at this time. The plan is: ***. The patient is safe for discharge and has been instructed to return immediately for worsening symptoms, change in symptoms or any other concerns.  Final Clinical Impression(s) / ED Diagnoses Final diagnoses:  None    Rx / DC Orders ED Discharge Orders     None

## 2023-03-13 NOTE — H&P (Signed)
History and Physical    Leroy Simmons ZOX:[id number] DOB: Apr 03, 1971 DOA: 03/13/2023  PCP: de [place], Raymond J, MD  Patient coming from: Home  I have personally briefly reviewed patient's old medical records in [facility]  Chief Complaint: Back abscess  HPI: Leroy Simmons is a 52 y.o. male with medical history significant for HFpEF (EF 60 to 65%), nonobstructive CAD, LLE DVT on Eliquis, insulin-dependent T2DM, HTN, CKD stage IIIa, asthma, HLD, depression/anxiety who presented to the ED for evaluation of left-sided chest and mid back pain.  Patient was seen in the ED early morning of 6/9 for evaluation of a small abscess to his mid back.  I&D was performed in the ED with scant bloody drainage.  T-spine x-ray was negative.  Patient was discharged with prescription for Bactrim.  Unfortunately patient has not been able to obtain the antibiotic.***  ED Course  Labs/Imaging on admission: I have personally reviewed following labs and imaging studies.  Initial vitals showed BP 147/85, pulse 89, RR 16, temp 90.9 F, SpO2 100% on room air.  Tmax 101.4 F.  Labs show WBC 15.7, hemoglobin 11.2, platelets 247,000, sodium 137, potassium 3.7, bicarb 25, serum glucose 402, BUN 21, creatinine 1.60, lactic acid 0.9, BNP 293.2, troponin 47 > 43.  2 view chest x-ray shows low lung volumes with hypoventilatory changes.  No focal consolidation.  CT chest with contrast showed nonspecific subcutaneous fat stranding in the midline back at the level of T9-T10 and extending inferiorly.  9 mm left lower lobe pulmonary nodule again noted and stable since 03/18/2022.  Patient was given total 1 L normal saline, 6 units IV NovoLog, IV morphine 4 mg x 2, IV ceftriaxone and oral doxycycline.  The hospitalist service was consulted to admit for further evaluation and management.  Review of Systems: All systems reviewed and are negative except as documented in history of present illness above.   Past Medical  History:  Diagnosis Date   Acute gangrenous appendicitis 09/25/2022   Anxiety    CHF (congestive heart failure) (HCC)    Depression    Diabetes mellitus without complication (HCC)    Hypertension     Past Surgical History:  Procedure Laterality Date   LAPAROSCOPIC APPENDECTOMY N/A 09/26/2022   Procedure: APPENDECTOMY LAPAROSCOPIC;  Surgeon: Campbell Lerner, MD;  Location: [facility] ORS;  Service: General;  Laterality: N/A;   LEFT HEART CATH AND CORONARY ANGIOGRAPHY N/A 03/21/2022   Procedure: LEFT HEART CATH AND CORONARY ANGIOGRAPHY;  Surgeon: Corky Crafts, MD;  Location: [facility] INVASIVE CV LAB;  Service: Cardiovascular;  Laterality: N/A;    Social History:  reports that he has never smoked. He has never used smokeless tobacco. He reports current alcohol use. He reports that he does not use drugs.  No Known Allergies  Family History  Problem Relation Age of Onset   Heart failure Mother      Prior to Admission medications   Medication Sig Start Date End Date Taking? Authorizing Provider  albuterol (VENTOLIN HFA) 108 (90 Base) MCG/ACT inhaler Inhale 2 puffs into the lungs every 6 (six) hours as needed for wheezing or shortness of breath. 01/13/23   Leslye Peer, MD  amLODipine (NORVASC) 5 MG tablet Take 1 tablet (5 mg total) by mouth daily. 05/24/22   de [place], Buren Kos, MD  apixaban Everlene Balls) 5 MG TABS tablet Take 2 tablets twice daily until 01/27/23, then continue with one tablet twice daily starting on 01/28/23. 01/24/23   Arrien, York Ram, MD  atorvastatin (LIPITOR) 80 MG tablet Take 1 tablet (80 mg total) by mouth daily. 01/25/23 02/24/23  Arrien, York Ram, MD  blood glucose meter kit and supplies Dispense based on patient and insurance preference. Use up to four times daily as directed. (FOR ICD-10 E10.9, E11.9). 05/24/22   de [place], Buren Kos, MD  busPIRone (BUSPAR) 5 MG tablet Take 5 mg by mouth as needed (anxiety).    [provider]  hydrALAZINE  (APRESOLINE) 50 MG tablet Take 1 tablet (50 mg total) by mouth every 8 (eight) hours. 01/24/23 02/23/23  Arrien, York Ram, MD  insulin glargine (LANTUS) 100 unit/mL SOPN Inject 34 Units into the skin daily. 02/09/23   Grayce Sessions, NP  insulin lispro (HUMALOG KWIKPEN) 100 UNIT/ML KwikPen Inject 5 Units into the skin 3 (three) times daily before meals. 02/09/23   Grayce Sessions, NP  Insulin Pen Needle (PEN NEEDLES 3/16") 31G X 5 MM MISC 1 each by Does not apply route daily. 01/18/22   de [place], Raymond J, MD  isosorbide mononitrate (IMDUR) 30 MG 24 hr tablet Take 1 tablet (30 mg total) by mouth daily. 01/24/23 02/23/23  Arrien, York Ram, MD  metFORMIN (GLUCOPHAGE-XR) 500 MG 24 hr tablet Take 4 tablets (2,000 mg total) by mouth at bedtime. 05/24/22   de [place], Buren Kos, MD  metoprolol succinate (TOPROL-XL) 50 MG 24 hr tablet Take 50 mg by mouth daily. Take with or immediately following a meal.    [provider]  mupirocin ointment (BACTROBAN) 2 % Apply 1 Application topically 2 (two) times daily. 12/22/22   Fisher, Roselyn Bering, PA-C  sulfamethoxazole-trimethoprim (BACTRIM DS) 800-160 MG tablet Take 1 tablet by mouth 2 (two) times daily for 7 days. 03/12/23 03/19/23  Carroll Sage, PA-C  torsemide (DEMADEX) 20 MG tablet Take 2 tablets (40 mg total) by mouth 2 (two) times daily. 02/08/23   Allayne Butcher, PA-C    Physical Exam: Vitals:   03/13/23 2017 03/13/23 2100 03/13/23 2126 03/13/23 2224  BP:  (!) 164/91    Pulse:  (!) 103    Resp:      Temp: 99.5 F (37.5 C)  (!) 101.4 F (38.6 C) 99.5 F (37.5 C)  TempSrc:   Oral Oral  SpO2:  93%    Weight:      Height:       *** Constitutional: NAD, calm, comfortable Eyes: PERRL, lids and conjunctivae normal ENMT: Mucous membranes are moist. Posterior pharynx clear of any exudate or lesions.Normal dentition.  Neck: normal, supple, no masses. Respiratory: clear to auscultation bilaterally, no wheezing, no crackles. Normal  respiratory effort. No accessory muscle use.  Cardiovascular: Regular rate and rhythm, no murmurs / rubs / gallops. No extremity edema. 2+ pedal pulses. Abdomen: no tenderness, no masses palpated. No hepatosplenomegaly. Bowel sounds positive.  Musculoskeletal: no clubbing / cyanosis. No joint deformity upper and lower extremities. Good ROM, no contractures. Normal muscle tone.  Skin: no rashes, lesions, ulcers. No induration Neurologic: CN 2-12 grossly intact. Sensation intact. Strength 5/5 in all 4.  Psychiatric: Normal judgment and insight. Alert and oriented x 3. Normal mood.   EKG: Personally reviewed. Sinus rhythm, rate 94, no acute ischemic changes.  Assessment/Plan Principal Problem:   Sepsis due to cellulitis [facility]) Active Problems:   Uncontrolled type 2 diabetes mellitus with hyperglycemia, with long-term current use of insulin (HCC)   Heart failure with preserved ejection fraction (HCC)   Deep venous thrombosis (DVT) of left peroneal vein (HCC)  Hypertension associated with diabetes (HCC)   Chronic kidney disease, stage 3a (HCC)   Asthma   Hyperlipidemia associated with type 2 diabetes mellitus (HCC)   Abscess of back, except buttock   Anxiety and depression   Coronary artery disease involving native coronary artery of native heart without angina pectoris   Left lower lobe pulmonary nodule   *** No notes on file *** Assessment and Plan: Deep venous thrombosis (DVT) of left peroneal vein (HCC) Noted on 01/19/2023.  Continue Eliquis.  Left lower lobe pulmonary nodule 9 mm LLL pulmonary nodule again noted and stable from 1 year ago.  Follows with pulmonology, Dr. Delton Coombes, follow-up CT planned for December 2024.       DVT prophylaxis: ***  Code Status: ***  Family Communication: ***  Disposition Plan: ***  Consults called: ***  Severity of Illness: The appropriate patient status for this patient is INPATIENT. Inpatient status is judged to be reasonable and necessary  in order to provide the required intensity of service to ensure the patient's safety. The patient's presenting symptoms, physical exam findings, and initial radiographic and laboratory data in the context of their chronic comorbidities is felt to place them at high risk for further clinical deterioration. Furthermore, it is not anticipated that the patient will be medically stable for discharge from the hospital within 2 midnights of admission.   * I certify that at the point of admission it is my clinical judgment that the patient will require inpatient hospital care spanning beyond 2 midnights from the point of admission due to high intensity of service, high risk for further deterioration and high frequency of surveillance required.Darreld Mclean MD [facility]  If 7PM-7AM, please contact night-coverage [email or web address]  03/13/2023, 11:48 PM

## 2023-03-13 NOTE — Assessment & Plan Note (Signed)
9 mm LLL pulmonary nodule again noted and stable from 1 year ago.  Follows with pulmonology, Dr. Delton Coombes, follow-up CT planned for December 2024.

## 2023-03-13 NOTE — ED Provider Notes (Signed)
[place] EMERGENCY DEPARTMENT AT [facility] Provider Note   CSN: [id number] Arrival date & time: 03/13/23  1613     History {Add pertinent medical, surgical, social history, OB history to HPI:1} Chief Complaint  Patient presents with   Abdominal Pain   Abscess   Back Pain    Leroy Simmons is a 52 y.o. male with past medical history significant for pulmonary hypertension, obesity, anxiety, depression, hypertension, diabetes, previous NSTEMI, CHF, cardiomegaly, diabetes 2 with current insulin requirement who presents with concern for ongoing back pain secondary to known abscess, chest pain, shortness of breath.  Patient was seen and evaluated in the emergency department 2 days ago, I&D performed of abscess, patient reports that he has not been able to pick up any antibiotics.  Patient reports that today he started having constant left-sided chest pain.  He denies worse with exertion.  He denies significant improvement with nitroglycerin.  He was not hypoxic at the scene, SpO2 min at 92, but he was placed on oxygen for comfort.  Patient reports that he has significant 9/10 pain at this time.  He denies vomiting, but does endorse some nausea.   Abdominal Pain Abscess Back Pain Associated symptoms: abdominal pain        Home Medications Prior to Admission medications   Medication Sig Start Date End Date Taking? Authorizing Provider  albuterol (VENTOLIN HFA) 108 (90 Base) MCG/ACT inhaler Inhale 2 puffs into the lungs every 6 (six) hours as needed for wheezing or shortness of breath. 01/13/23   Leslye Peer, MD  amLODipine (NORVASC) 5 MG tablet Take 1 tablet (5 mg total) by mouth daily. 05/24/22   de [place], Buren Kos, MD  apixaban Everlene Balls) 5 MG TABS tablet Take 2 tablets twice daily until 01/27/23, then continue with one tablet twice daily starting on 01/28/23. 01/24/23   Arrien, York Ram, MD  atorvastatin (LIPITOR) 80 MG tablet Take 1 tablet (80 mg total) by mouth  daily. 01/25/23 02/24/23  Arrien, York Ram, MD  blood glucose meter kit and supplies Dispense based on patient and insurance preference. Use up to four times daily as directed. (FOR ICD-10 E10.9, E11.9). 05/24/22   de [place], Buren Kos, MD  busPIRone (BUSPAR) 5 MG tablet Take 5 mg by mouth as needed (anxiety).    [provider]  hydrALAZINE (APRESOLINE) 50 MG tablet Take 1 tablet (50 mg total) by mouth every 8 (eight) hours. 01/24/23 02/23/23  Arrien, York Ram, MD  insulin glargine (LANTUS) 100 unit/mL SOPN Inject 34 Units into the skin daily. 02/09/23   Grayce Sessions, NP  insulin lispro (HUMALOG KWIKPEN) 100 UNIT/ML KwikPen Inject 5 Units into the skin 3 (three) times daily before meals. 02/09/23   Grayce Sessions, NP  Insulin Pen Needle (PEN NEEDLES 3/16") 31G X 5 MM MISC 1 each by Does not apply route daily. 01/18/22   de [place], Raymond J, MD  isosorbide mononitrate (IMDUR) 30 MG 24 hr tablet Take 1 tablet (30 mg total) by mouth daily. 01/24/23 02/23/23  Arrien, York Ram, MD  metFORMIN (GLUCOPHAGE-XR) 500 MG 24 hr tablet Take 4 tablets (2,000 mg total) by mouth at bedtime. 05/24/22   de [place], Buren Kos, MD  metoprolol succinate (TOPROL-XL) 50 MG 24 hr tablet Take 50 mg by mouth daily. Take with or immediately following a meal.    [provider]  mupirocin ointment (BACTROBAN) 2 % Apply 1 Application topically 2 (two) times daily. 12/22/22   Faythe Ghee,  PA-C  sulfamethoxazole-trimethoprim (BACTRIM DS) 800-160 MG tablet Take 1 tablet by mouth 2 (two) times daily for 7 days. 03/12/23 03/19/23  Carroll Sage, PA-C  torsemide (DEMADEX) 20 MG tablet Take 2 tablets (40 mg total) by mouth 2 (two) times daily. 02/08/23   Allayne Butcher, PA-C      Allergies    Patient has no known allergies.    Review of Systems   Review of Systems  Gastrointestinal:  Positive for abdominal pain.  Musculoskeletal:  Positive for back pain.  All other systems reviewed and  are negative.   Physical Exam Updated Vital Signs BP (!) 147/85 (BP Location: Right Arm)   Pulse 99   Temp 98.9 F (37.2 C) (Oral)   Resp 16   Ht 5' 7.5" (1.715 m)   Wt 104.3 kg   SpO2 100%   BMI 35.49 kg/m  Physical Exam Vitals and nursing note reviewed.  Constitutional:      General: He is not in acute distress.    Appearance: Normal appearance.  HENT:     Head: Normocephalic and atraumatic.  Eyes:     General:        Right eye: No discharge.        Left eye: No discharge.  Cardiovascular:     Rate and Rhythm: Normal rate and regular rhythm.     Heart sounds: No murmur heard.    No friction rub. No gallop.  Pulmonary:     Effort: Pulmonary effort is normal.     Breath sounds: Normal breath sounds.  Abdominal:     General: Bowel sounds are normal.     Palpations: Abdomen is soft.  Skin:    General: Skin is warm and dry.     Capillary Refill: Capillary refill takes less than 2 seconds.     Comments: Abscess noted of back is approximately similar appearance has images from 1 day ago, possible slightly increased redness on my exam.  Neurological:     Mental Status: He is alert and oriented to person, place, and time.  Psychiatric:        Mood and Affect: Mood normal.        Behavior: Behavior normal.     ED Results / Procedures / Treatments   Labs (all labs ordered are listed, but only abnormal results are displayed) Labs Reviewed  BASIC METABOLIC PANEL  CBC  BRAIN NATRIURETIC PEPTIDE  TROPONIN I (HIGH SENSITIVITY)    EKG None  Radiology DG Thoracic Spine 2 View  Result Date: 03/12/2023 CLINICAL DATA:  Back wound EXAM: THORACIC SPINE 2 VIEWS COMPARISON:  None Available. FINDINGS: There is no evidence of thoracic spine fracture. Alignment is normal. No other significant bone abnormalities are identified. IMPRESSION: Negative. Electronically Signed   By: Helyn Numbers M.D.   On: 03/12/2023 01:32    Procedures Procedures  {Document cardiac monitor,  telemetry assessment procedure when appropriate:1}  Medications Ordered in ED Medications  morphine (PF) 4 MG/ML injection 4 mg (has no administration in time range)  ondansetron (ZOFRAN) injection 4 mg (has no administration in time range)  doxycycline (VIBRA-TABS) tablet 100 mg (has no administration in time range)    ED Course/ Medical Decision Making/ A&P   {   Click here for ABCD2, HEART and other calculatorsREFRESH Note before signing :1}                          Medical Decision Making Amount  and/or Complexity of Data Reviewed Labs: ordered. Radiology: ordered.  Risk OTC drugs. Prescription drug management.   This patient is a 52 y.o. male  who presents to the ED for concern of ***.   Differential diagnoses prior to evaluation: The emergent differential diagnosis includes, but is not limited to,  *** . This is not an exhaustive differential.   Past Medical History / Co-morbidities / Social History: ***  Additional history: Chart reviewed. Pertinent results include: ***  Physical Exam: Physical exam performed. The pertinent findings include: ***  Lab Tests/Imaging studies: I personally interpreted labs/imaging and the pertinent results include:  ***. ***I agree with the radiologist interpretation.  Cardiac monitoring: EKG obtained and interpreted by my attending physician which shows: ***   Medications: I ordered medication including ***.  I have reviewed the patients home medicines and have made adjustments as needed.   Disposition: After consideration of the diagnostic results and the patients response to treatment, I feel that *** .   ***emergency department workup does not suggest an emergent condition requiring admission or immediate intervention beyond what has been performed at this time. The plan is: ***. The patient is safe for discharge and has been instructed to return immediately for worsening symptoms, change in symptoms or any other  concerns.  Final Clinical Impression(s) / ED Diagnoses Final diagnoses:  None    Rx / DC Orders ED Discharge Orders     None

## 2023-03-14 DIAGNOSIS — L03312 Cellulitis of back [any part except buttock]: Secondary | ICD-10-CM | POA: Diagnosis not present

## 2023-03-14 DIAGNOSIS — A419 Sepsis, unspecified organism: Secondary | ICD-10-CM | POA: Diagnosis not present

## 2023-03-14 DIAGNOSIS — Z794 Long term (current) use of insulin: Secondary | ICD-10-CM

## 2023-03-14 DIAGNOSIS — L0390 Cellulitis, unspecified: Secondary | ICD-10-CM | POA: Diagnosis not present

## 2023-03-14 DIAGNOSIS — E1165 Type 2 diabetes mellitus with hyperglycemia: Secondary | ICD-10-CM

## 2023-03-14 LAB — CBC
HCT: 33.3 % — ABNORMAL LOW (ref 39.0–52.0)
Hemoglobin: 11.5 g/dL — ABNORMAL LOW (ref 13.0–17.0)
MCH: 30.7 pg (ref 26.0–34.0)
MCHC: 34.5 g/dL (ref 30.0–36.0)
MCV: 89 fL (ref 80.0–100.0)
Platelets: 200 10*3/uL (ref 150–400)
RBC: 3.74 MIL/uL — ABNORMAL LOW (ref 4.22–5.81)
RDW: 12.2 % (ref 11.5–15.5)
WBC: 15.8 10*3/uL — ABNORMAL HIGH (ref 4.0–10.5)
nRBC: 0 % (ref 0.0–0.2)

## 2023-03-14 LAB — I-STAT VENOUS BLOOD GAS, ED
Acid-Base Excess: 1 mmol/L (ref 0.0–2.0)
Bicarbonate: 27.6 mmol/L (ref 20.0–28.0)
Calcium, Ion: 1.21 mmol/L (ref 1.15–1.40)
HCT: 34 % — ABNORMAL LOW (ref 39.0–52.0)
Hemoglobin: 11.6 g/dL — ABNORMAL LOW (ref 13.0–17.0)
O2 Saturation: 63 %
Potassium: 4.3 mmol/L (ref 3.5–5.1)
Sodium: 139 mmol/L (ref 135–145)
TCO2: 29 mmol/L (ref 22–32)
pCO2, Ven: 49.2 mmHg (ref 44–60)
pH, Ven: 7.357 (ref 7.25–7.43)
pO2, Ven: 34 mmHg (ref 32–45)

## 2023-03-14 LAB — BASIC METABOLIC PANEL
Anion gap: 11 (ref 5–15)
BUN: 23 mg/dL — ABNORMAL HIGH (ref 6–20)
CO2: 22 mmol/L (ref 22–32)
Calcium: 8.2 mg/dL — ABNORMAL LOW (ref 8.9–10.3)
Chloride: 101 mmol/L (ref 98–111)
Creatinine, Ser: 1.74 mg/dL — ABNORMAL HIGH (ref 0.61–1.24)
GFR, Estimated: 47 mL/min — ABNORMAL LOW (ref >60)
Glucose, Bld: 289 mg/dL — ABNORMAL HIGH (ref 70–99)
Potassium: 4.1 mmol/L (ref 3.5–5.1)
Sodium: 134 mmol/L — ABNORMAL LOW (ref 135–145)

## 2023-03-14 LAB — GLUCOSE, CAPILLARY
Glucose-Capillary: 206 mg/dL — ABNORMAL HIGH (ref 70–99)
Glucose-Capillary: 229 mg/dL — ABNORMAL HIGH (ref 70–99)
Glucose-Capillary: 217 mg/dL — ABNORMAL HIGH (ref 70–99)
Glucose-Capillary: 248 mg/dL — ABNORMAL HIGH (ref 70–99)

## 2023-03-14 LAB — PROCALCITONIN: Procalcitonin: 0.1 ng/mL

## 2023-03-14 LAB — CBG MONITORING, ED: Glucose-Capillary: 241 mg/dL — ABNORMAL HIGH (ref 70–99)

## 2023-03-14 MED ORDER — SODIUM CHLORIDE 0.9% FLUSH
3.0000 mL | Freq: Two times a day (BID) | INTRAVENOUS | Status: DC
  Administered 2023-03-14 – 2023-03-22 (×17): 3 mL via INTRAVENOUS

## 2023-03-14 MED ORDER — APIXABAN 5 MG PO TABS
5.0000 mg | ORAL_TABLET | Freq: Two times a day (BID) | ORAL | Status: DC
  Administered 2023-03-14 – 2023-03-19 (×11): 5 mg via ORAL
  Filled 2023-03-14 (×11): qty 1

## 2023-03-14 MED ORDER — METOPROLOL SUCCINATE ER 50 MG PO TB24
50.0000 mg | ORAL_TABLET | Freq: Every day | ORAL | Status: DC
  Administered 2023-03-14 – 2023-03-22 (×9): 50 mg via ORAL
  Filled 2023-03-14 (×9): qty 1

## 2023-03-14 MED ORDER — INSULIN GLARGINE-YFGN 100 UNIT/ML ~~LOC~~ SOLN
10.0000 [IU] | Freq: Every day | SUBCUTANEOUS | Status: DC
  Administered 2023-03-14 – 2023-03-17 (×4): 10 [IU] via SUBCUTANEOUS
  Filled 2023-03-14 (×5): qty 0.1

## 2023-03-14 MED ORDER — LINEZOLID 600 MG PO TABS
600.0000 mg | ORAL_TABLET | Freq: Two times a day (BID) | ORAL | Status: DC
  Administered 2023-03-14 – 2023-03-22 (×17): 600 mg via ORAL
  Filled 2023-03-14 (×18): qty 1

## 2023-03-14 MED ORDER — ALBUTEROL SULFATE (2.5 MG/3ML) 0.083% IN NEBU: 2.5000 mg | INHALATION_SOLUTION | Freq: Four times a day (QID) | RESPIRATORY_TRACT | Status: DC | PRN

## 2023-03-14 MED ORDER — TORSEMIDE 20 MG PO TABS
40.0000 mg | ORAL_TABLET | Freq: Two times a day (BID) | ORAL | Status: DC
  Administered 2023-03-14 – 2023-03-15 (×3): 40 mg via ORAL
  Filled 2023-03-14 (×4): qty 2

## 2023-03-14 MED ORDER — SODIUM CHLORIDE 0.9 % IV SOLN
2.0000 g | INTRAVENOUS | Status: DC
  Administered 2023-03-14: 2 g via INTRAVENOUS
  Filled 2023-03-14: qty 20

## 2023-03-14 MED ORDER — ATORVASTATIN CALCIUM 80 MG PO TABS
80.0000 mg | ORAL_TABLET | Freq: Every day | ORAL | Status: DC
  Administered 2023-03-14 – 2023-03-22 (×9): 80 mg via ORAL
  Filled 2023-03-14 (×9): qty 1

## 2023-03-14 MED ORDER — ONDANSETRON HCL 4 MG PO TABS
4.0000 mg | ORAL_TABLET | Freq: Four times a day (QID) | ORAL | Status: DC | PRN
  Administered 2023-03-17: 4 mg via ORAL
  Filled 2023-03-14: qty 1

## 2023-03-14 MED ORDER — ACETAMINOPHEN 325 MG PO TABS
650.0000 mg | ORAL_TABLET | Freq: Four times a day (QID) | ORAL | Status: DC | PRN
  Administered 2023-03-14 – 2023-03-16 (×2): 650 mg via ORAL
  Filled 2023-03-14 (×2): qty 2

## 2023-03-14 MED ORDER — AMLODIPINE BESYLATE 5 MG PO TABS
5.0000 mg | ORAL_TABLET | Freq: Every day | ORAL | Status: DC
  Administered 2023-03-14 – 2023-03-21 (×8): 5 mg via ORAL
  Filled 2023-03-14 (×8): qty 1

## 2023-03-14 MED ORDER — ISOSORBIDE MONONITRATE ER 30 MG PO TB24
30.0000 mg | ORAL_TABLET | Freq: Every day | ORAL | Status: DC
  Administered 2023-03-14 – 2023-03-22 (×9): 30 mg via ORAL
  Filled 2023-03-14 (×9): qty 1

## 2023-03-14 MED ORDER — OXYCODONE HCL 5 MG PO TABS
5.0000 mg | ORAL_TABLET | ORAL | Status: DC | PRN
  Administered 2023-03-14 – 2023-03-21 (×11): 5 mg via ORAL
  Filled 2023-03-14 (×11): qty 1

## 2023-03-14 MED ORDER — INSULIN ASPART 100 UNIT/ML IJ SOLN
0.0000 [IU] | Freq: Every day | INTRAMUSCULAR | Status: DC
  Administered 2023-03-14 – 2023-03-18 (×4): 2 [IU] via SUBCUTANEOUS
  Administered 2023-03-19: 5 [IU] via SUBCUTANEOUS

## 2023-03-14 MED ORDER — INSULIN ASPART 100 UNIT/ML IJ SOLN
0.0000 [IU] | Freq: Three times a day (TID) | INTRAMUSCULAR | Status: DC
  Administered 2023-03-14 – 2023-03-15 (×5): 5 [IU] via SUBCUTANEOUS
  Administered 2023-03-15 – 2023-03-16 (×3): 3 [IU] via SUBCUTANEOUS
  Administered 2023-03-16: 5 [IU] via SUBCUTANEOUS
  Administered 2023-03-17 (×3): 3 [IU] via SUBCUTANEOUS
  Administered 2023-03-18: 5 [IU] via SUBCUTANEOUS
  Administered 2023-03-18 (×2): 3 [IU] via SUBCUTANEOUS
  Administered 2023-03-19: 11 [IU] via SUBCUTANEOUS
  Administered 2023-03-19: 3 [IU] via SUBCUTANEOUS
  Administered 2023-03-19: 11 [IU] via SUBCUTANEOUS
  Administered 2023-03-20 (×2): 5 [IU] via SUBCUTANEOUS
  Administered 2023-03-20: 11 [IU] via SUBCUTANEOUS
  Administered 2023-03-21 – 2023-03-22 (×4): 5 [IU] via SUBCUTANEOUS

## 2023-03-14 MED ORDER — ONDANSETRON HCL 4 MG/2ML IJ SOLN
4.0000 mg | Freq: Four times a day (QID) | INTRAMUSCULAR | Status: DC | PRN
  Administered 2023-03-15 – 2023-03-22 (×2): 4 mg via INTRAVENOUS
  Filled 2023-03-14 (×2): qty 2

## 2023-03-14 MED ORDER — BUSPIRONE HCL 5 MG PO TABS: 5.0000 mg | ORAL_TABLET | Freq: Two times a day (BID) | ORAL | Status: DC | PRN

## 2023-03-14 MED ORDER — INSULIN GLARGINE-YFGN 100 UNIT/ML ~~LOC~~ SOLN
20.0000 [IU] | Freq: Every day | SUBCUTANEOUS | Status: DC
  Administered 2023-03-14 – 2023-03-19 (×7): 20 [IU] via SUBCUTANEOUS
  Filled 2023-03-14 (×8): qty 0.2

## 2023-03-14 MED ORDER — LACTATED RINGERS IV SOLN
150.0000 mL/h | INTRAVENOUS | Status: DC
  Administered 2023-03-14: 150 mL/h via INTRAVENOUS

## 2023-03-14 MED ORDER — ACETAMINOPHEN 650 MG RE SUPP: 650.0000 mg | Freq: Four times a day (QID) | RECTAL | Status: DC | PRN

## 2023-03-14 MED ORDER — SENNOSIDES-DOCUSATE SODIUM 8.6-50 MG PO TABS
1.0000 | ORAL_TABLET | Freq: Every evening | ORAL | Status: DC | PRN
  Administered 2023-03-16 – 2023-03-21 (×3): 1 via ORAL
  Filled 2023-03-14 (×3): qty 1

## 2023-03-14 NOTE — Assessment & Plan Note (Signed)
Appears euvolemic on admission.  EF 60-65% by TTE 01/19/2023. -Continue torsemide 40 mg twice daily -Continue Toprol-XL 30 mg daily

## 2023-03-14 NOTE — Assessment & Plan Note (Signed)
Continue Toprol-XL, Imdur, amlodipine.

## 2023-03-14 NOTE — ED Notes (Signed)
ED TO INPATIENT HANDOFF REPORT  ED Nurse Name and Phone #:  Latricia Heft Name/Age/Gender Leroy Simmons 52 y.o. male Room/Bed: 009C/009C  Code Status   Code Status: Full Code  Home/SNF/Other Home Patient oriented to: self, place, time, and situation Is this baseline? Yes   Triage Complete: Triage complete  Chief Complaint Sepsis due to cellulitis (HCC) [L03.90, A41.9]  Triage Note No notes on file   Allergies No Known Allergies  Level of Care/Admitting Diagnosis ED Disposition     ED Disposition  Admit   Condition  --   Comment  Hospital Area: [facility] [[id number]]  Level of Care: Telemetry Medical [104]  May admit patient to [facility] or [facility] if equivalent level of care is available:: No  Covid Evaluation: Asymptomatic - no recent exposure (last 10 days) testing not required  Diagnosis: Sepsis due to cellulitis [facility]) [[id number]]  Admitting Physician: Charlsie Quest [[id number]]  Attending Physician: Charlsie Quest [[id number]]  Certification:: I certify this patient will need inpatient services for at least 2 midnights  Estimated Length of Stay: 2          B Medical/Surgery History Past Medical History:  Diagnosis Date   Acute gangrenous appendicitis 09/25/2022   Anxiety    CHF (congestive heart failure) (HCC)    Depression    Diabetes mellitus without complication (HCC)    Hypertension    Past Surgical History:  Procedure Laterality Date   LAPAROSCOPIC APPENDECTOMY N/A 09/26/2022   Procedure: APPENDECTOMY LAPAROSCOPIC;  Surgeon: Campbell Lerner, MD;  Location: [facility] ORS;  Service: General;  Laterality: N/A;   LEFT HEART CATH AND CORONARY ANGIOGRAPHY N/A 03/21/2022   Procedure: LEFT HEART CATH AND CORONARY ANGIOGRAPHY;  Surgeon: Corky Crafts, MD;  Location: [facility] INVASIVE CV LAB;  Service: Cardiovascular;  Laterality: N/A;     A IV Location/Drains/Wounds Patient Lines/Drains/Airways Status     Active Line/Drains/Airways      Name Placement date Placement time Site Days   Peripheral IV 03/13/23 20 G Anterior;Proximal;Right Forearm 03/13/23  1638  Forearm  1   Peripheral IV 03/13/23 20 G Posterior;Right Wrist 03/13/23  1645  Wrist  1   Closed System Drain 1 Inferior;Medial;Midline Abdomen Bulb (JP) 19 Fr. 09/26/22  1411  Abdomen  169   Incision - 3 Ports Abdomen Umbilicus Left;Mid Mid;Lower 09/26/22  1306  -- 169            Intake/Output Last 24 hours No intake or output data in the 24 hours ending 03/14/23 0043  Labs/Imaging Results for orders placed or performed during the hospital encounter of 03/13/23 (from the past 48 hour(s))  Brain natriuretic peptide     Status: Abnormal   Collection Time: 03/13/23  4:30 PM  Result Value Ref Range   B Natriuretic Peptide 293.2 (H) 0.0 - 100.0 pg/mL    Comment: Performed at [facility], [address] [place], [place] [address]  Basic metabolic panel     Status: Abnormal   Collection Time: 03/13/23  4:38 PM  Result Value Ref Range   Sodium 137 135 - 145 mmol/L   Potassium 3.7 3.5 - 5.1 mmol/L   Chloride 96 (L) 98 - 111 mmol/L   CO2 25 22 - 32 mmol/L   Glucose, Bld 402 (H) 70 - 99 mg/dL    Comment: Glucose reference range applies only to samples taken after fasting for at least 8 hours.   BUN 21 (H) 6 -  20 mg/dL   Creatinine, Ser 4.40 (H) 0.61 - 1.24 mg/dL   Calcium 8.9 8.9 - 10.2 mg/dL   GFR, Estimated 52 (L) >60 mL/min    Comment: (NOTE) Calculated using the CKD-EPI Creatinine Equation (2021)    Anion gap 16 (H) 5 - 15    Comment: Performed at [facility], [address] [place], [place] [address]  CBC     Status: Abnormal   Collection Time: 03/13/23  4:38 PM  Result Value Ref Range   WBC 15.7 (H) 4.0 - 10.5 K/uL   RBC 3.77 (L) 4.22 - 5.81 MIL/uL   Hemoglobin 11.2 (L) 13.0 - 17.0 g/dL   HCT 64.4 (L) 03.4 - 74.2 %   MCV 89.4 80.0 - 100.0 fL   MCH 29.7 26.0 - 34.0 pg   MCHC 33.2 30.0 - 36.0 g/dL   RDW 59.5 63.8 - 75.6 %    Platelets 247 150 - 400 K/uL   nRBC 0.0 0.0 - 0.2 %    Comment: Performed at [facility], [address] [place], [place] [address]  Troponin I (High Sensitivity)     Status: Abnormal   Collection Time: 03/13/23  4:38 PM  Result Value Ref Range   Troponin I (High Sensitivity) 47 (H) <18 ng/L    Comment: (NOTE) Elevated high sensitivity troponin I (hsTnI) values and significant  changes across serial measurements may suggest ACS but many other  chronic and acute conditions are known to elevate hsTnI results.  Refer to the "Links" section for chest pain algorithms and additional  guidance. Performed at [facility], [address], [place], [place] [address]   Troponin I (High Sensitivity)     Status: Abnormal   Collection Time: 03/13/23  7:00 PM  Result Value Ref Range   Troponin I (High Sensitivity) 43 (H) <18 ng/L    Comment: (NOTE) Elevated high sensitivity troponin I (hsTnI) values and significant  changes across serial measurements may suggest ACS but many other  chronic and acute conditions are known to elevate hsTnI results.  Refer to the "Links" section for chest pain algorithms and additional  guidance. Performed at [facility], [address] [place], [place] [address]   I-Stat venous blood gas, ED     Status: Abnormal   Collection Time: 03/13/23  8:17 PM  Result Value Ref Range   pH, Ven 7.287 7.25 - 7.43   pCO2, Ven 59.8 44 - 60 mmHg   pO2, Ven 58 (H) 32 - 45 mmHg   Bicarbonate 28.6 (H) 20.0 - 28.0 mmol/L   TCO2 30 22 - 32 mmol/L   O2 Saturation 85 %   Acid-Base Excess 1.0 0.0 - 2.0 mmol/L   Sodium 137 135 - 145 mmol/L   Potassium 3.7 3.5 - 5.1 mmol/L   Calcium, Ion 1.19 1.15 - 1.40 mmol/L   HCT 31.0 (L) 39.0 - 52.0 %   Hemoglobin 10.5 (L) 13.0 - 17.0 g/dL   Sample type VENOUS   POC CBG, ED     Status: Abnormal   Collection Time: 03/13/23  8:45 PM  Result Value Ref Range   Glucose-Capillary 278 (H) 70 - 99 mg/dL    Comment: Glucose  reference range applies only to samples taken after fasting for at least 8 hours.  Lactic acid, plasma     Status: None   Collection Time: 03/13/23  9:56 PM  Result Value Ref Range   Lactic Acid, Venous 0.9  0.5 - 1.9 mmol/L    Comment: Performed at [facility], [address], [place], [place] [address]   CT Chest W Contrast  Result Date: 03/13/2023 CLINICAL DATA:  Chest wall pain, nontraumatic, infection or inflammation suspected. X-ray done EXAM: CT CHEST WITH CONTRAST TECHNIQUE: Multidetector CT imaging of the chest was performed during intravenous contrast administration. RADIATION DOSE REDUCTION: This exam was performed according to the departmental dose-optimization program which includes automated exposure control, adjustment of the mA and/or kV according to patient size and/or use of iterative reconstruction technique. CONTRAST:  50mL OMNIPAQUE IOHEXOL 350 MG/ML SOLN COMPARISON:  Chest radiographs 03/13/2023 FINDINGS: Cardiovascular: Normal heart size. No pericardial effusion. Coronary artery atherosclerotic calcification. Mediastinum/Nodes: Trachea and esophagus are unremarkable. No thoracic adenopathy. Lungs/Pleura: Lungs are clear. No pleural effusion or pneumothorax. 9 mm left lower lobe pulmonary nodule (4/99) is stable since 03/18/2022. Upper Abdomen: No acute abnormality. Musculoskeletal: No acute fracture or destructive osseous lesion. Bilateral gynecomastia. Subcutaneous fat stranding in the midline back at the level of T9-T10 and extending inferiorly. No drainable fluid collection or abscess IMPRESSION: 1. Nonspecific subcutaneous fat stranding in the midline back at the level of T9-T10 and extending inferiorly. Correlate for cellulitis or contusion. 2. 9 mm left lower lobe pulmonary nodule, stable since 03/18/2022. Follow-up in 6-12 months (from today's scan) is considered optional for low risk patients, but is recommended for high-risk patients. This recommendation follows the  consensus statement: Guidelines for Management of Incidental Pulmonary Nodules Detected on CT Images: From the Fleischner Society 2017; Radiology 2017; 284:228-243. Electronically Signed   By: Minerva Fester M.D.   On: 03/13/2023 22:46   DG Chest 2 View  Result Date: 03/13/2023 CLINICAL DATA:  Chest pain EXAM: CHEST - 2 VIEW COMPARISON:  None Available. FINDINGS: Cardiomegaly. Low lung volumes with hypoventilatory changes. No focal consolidation. No evidence of pleural effusion or pneumothorax. IMPRESSION: Low lung volumes with hypoventilatory changes. No focal consolidation. Electronically Signed   By: Allegra Lai M.D.   On: 03/13/2023 18:21   DG Thoracic Spine 2 View  Result Date: 03/12/2023 CLINICAL DATA:  Back wound EXAM: THORACIC SPINE 2 VIEWS COMPARISON:  None Available. FINDINGS: There is no evidence of thoracic spine fracture. Alignment is normal. No other significant bone abnormalities are identified. IMPRESSION: Negative. Electronically Signed   By: Helyn Numbers M.D.   On: 03/12/2023 01:32    Pending Labs Unresulted Labs (From admission, onward)     Start     Ordered   03/14/23 0500  Procalcitonin  Tomorrow morning,   R       References:    Procalcitonin Lower Respiratory Tract Infection AND Sepsis Procalcitonin Algorithm   03/14/23 0010   03/14/23 0500  CBC  Tomorrow morning,   R        03/14/23 0010   03/14/23 0500  Basic metabolic panel  Tomorrow morning,   R        03/14/23 0010   03/13/23 1835  Blood gas, venous (at [facility] and [facility])  Once,   R        03/13/23 1834            Vitals/Pain Today's Vitals   03/13/23 2100 03/13/23 2125 03/13/23 2126 03/13/23 2224  BP: (!) 164/91     Pulse: (!) 103     Resp:      Temp:   (!) 101.4 F (38.6 C) 99.5 F (37.5 C)  TempSrc:   Oral Oral  SpO2: 93%  Weight:      Height:      PainSc:  7       Isolation Precautions No active isolations  Medications Medications  sodium chloride flush (NS) 0.9 % injection 3 mL (3  mLs Intravenous Given 03/14/23 0024)  cefTRIAXone (ROCEPHIN) 2 g in sodium chloride 0.9 % 100 mL IVPB (2 g Intravenous New Bag/Given 03/14/23 0023)  acetaminophen (TYLENOL) tablet 650 mg (has no administration in time range)    Or  acetaminophen (TYLENOL) suppository 650 mg (has no administration in time range)  ondansetron (ZOFRAN) tablet 4 mg (has no administration in time range)    Or  ondansetron (ZOFRAN) injection 4 mg (has no administration in time range)  senna-docusate (Senokot-S) tablet 1 tablet (has no administration in time range)  insulin glargine-yfgn (SEMGLEE) injection 20 Units (has no administration in time range)  insulin aspart (novoLOG) injection 0-5 Units (has no administration in time range)  insulin aspart (novoLOG) injection 0-15 Units (has no administration in time range)  albuterol (PROVENTIL) (2.5 MG/3ML) 0.083% nebulizer solution 2.5 mg (has no administration in time range)  apixaban (ELIQUIS) tablet 5 mg (has no administration in time range)  atorvastatin (LIPITOR) tablet 80 mg (has no administration in time range)  metoprolol succinate (TOPROL-XL) 24 hr tablet 50 mg (has no administration in time range)  torsemide (DEMADEX) tablet 40 mg (has no administration in time range)  busPIRone (BUSPAR) tablet 5 mg (has no administration in time range)  isosorbide mononitrate (IMDUR) 24 hr tablet 30 mg (has no administration in time range)  amLODipine (NORVASC) tablet 5 mg (has no administration in time range)  morphine (PF) 4 MG/ML injection 4 mg (4 mg Intravenous Given 03/13/23 1719)  ondansetron (ZOFRAN) injection 4 mg (4 mg Intravenous Given 03/13/23 1719)  doxycycline (VIBRA-TABS) tablet 100 mg (100 mg Oral Given 03/13/23 1719)  sodium chloride 0.9 % bolus 500 mL (0 mLs Intravenous Stopped 03/13/23 1954)  insulin aspart (novoLOG) injection 6 Units (6 Units Intravenous Given 03/13/23 1905)  morphine (PF) 4 MG/ML injection 4 mg (4 mg Intravenous Given 03/13/23 2047)   acetaminophen (TYLENOL) tablet 650 mg (650 mg Oral Given 03/13/23 2126)  sodium chloride 0.9 % bolus 500 mL (0 mLs Intravenous Stopped 03/13/23 2305)  iohexol (OMNIPAQUE) 350 MG/ML injection 50 mL (50 mLs Intravenous Contrast Given 03/13/23 2215)    Mobility walks     Focused Assessments    R Recommendations: See Admitting Provider Note  Report given to:   Additional Notes:  Pt uses the urinal.

## 2023-03-14 NOTE — Assessment & Plan Note (Signed)
Presenting with leukocytosis, fever, tachycardia.  Has small mid thoracic back abscess with surrounding cellulitis which is likely source.  S/p I&D on 6/9.  CT without obvious evidence of deep penetrating infection. -Continue IV ceftriaxone -S/p 1 L normal saline; will avoid aggressive fluid resuscitation given normal lactic acid, adequate blood pressure, and CHF history

## 2023-03-14 NOTE — Assessment & Plan Note (Signed)
Creatinine improved to 1.60 compared to recent labs around 2.

## 2023-03-14 NOTE — Consult Note (Signed)
   [facility] CM Inpatient Consult   03/14/2023  Leroy Simmons [birth year] [id number]  [facility] [[facility]]  Accountable Care Organization [ACO] Patient: [employer]  Primary Care Provider:  [place], Buren Kos, MD with [facility]   Patient screened for hospitalization with noted *** risk score for unplanned readmission risk ***length of stay and *** to assess for potential [facility]  [[facility]] Care Management service needs for post hospital transition for care coordination.  Review of patient's electronic medical record reveals patient is ***   Plan:  ***Continue to follow progress and disposition to assess for post hospital community care coordination/management needs.  ***Referral request for community care coordination:  ***Of note, [facility] Care Management/Population Health does not replace or interfere with any arrangements made by the Inpatient Transition of Care team.  For questions contact:   ***Charlesetta Shanks, RN BSN CCM [facility]  [phone] business mobile phone Toll free office [phone]  *Concierge Line  [phone] Fax number: [phone] [name]

## 2023-03-14 NOTE — Assessment & Plan Note (Signed)
Stable, continue albuterol as needed. 

## 2023-03-14 NOTE — Discharge Instructions (Signed)

## 2023-03-14 NOTE — Assessment & Plan Note (Signed)
Nonobstructive CAD by [facility] 03/2022.  Minimal troponin elevation is likely demand ischemia.  Chest discomfort is likely musculoskeletal in etiology.  EKG without acute ischemic changes. -Continue Eliquis, atorvastatin, Imdur, Toprol-XL

## 2023-03-14 NOTE — Assessment & Plan Note (Signed)
-   Continue BuSpar 

## 2023-03-14 NOTE — Progress Notes (Signed)
Ok to change ceftriaxone to PO zyvox 600mg  BID x7d to empirically cover for MRSA per Dr. Rito Ehrlich.  Ulyses Southward, PharmD, BCIDP, AAHIVP, CPP Infectious Disease Pharmacist 03/14/2023 8:55 AM

## 2023-03-14 NOTE — Assessment & Plan Note (Signed)
Continue atorvastatin

## 2023-03-14 NOTE — Hospital Course (Signed)
Leroy Simmons is a 52 y.o. male with medical history significant for HFpEF (EF 60 to 65%), nonobstructive CAD, LLE DVT on Eliquis, insulin-dependent T2DM, HTN, CKD stage IIIa, asthma, HLD, depression/anxiety who is admitted with sepsis due to cellulitis associated with small back abscess.

## 2023-03-14 NOTE — Assessment & Plan Note (Addendum)
Serum glucose 402 without evidence of DKA/HHS.  Given IV NovoLog 6 units while in the ED. -Placed on Semglee 20 units qhs plus SSI -Holding metformin

## 2023-03-14 NOTE — Inpatient Diabetes Management (Signed)
Inpatient Diabetes Program Recommendations  AACE/ADA: New Consensus Statement on Inpatient Glycemic Control (2015)  Target Ranges:  Prepandial:   less than 140 mg/dL      Peak postprandial:   less than 180 mg/dL (1-2 hours)      Critically ill patients:  140 - 180 mg/dL   Lab Results  Component Value Date   GLUCAP 206 (H) 03/14/2023   HGBA1C 12.4 (A) 02/09/2023    Latest Reference Range & Units 03/13/23 20:45 03/14/23 01:03 03/14/23 08:59  Glucose-Capillary 70 - 99 mg/dL 161 (H) 096 (H) 045 (H)  (H): Data is abnormally high  Diabetes history: DM2 Outpatient Diabetes medications: Lantus 34 units, Humalog 5 tid Current orders for Inpatient glycemic control: Semglee 20 units qd, Novolog 0-15 units tid, 0-5 units hs  Inpatient Diabetes Program Recommendations:   Please consider: -Increase Semglee to 25 units qd -Add Novolog 3 units tid meal coverage if eats 50% meals  DM coordinator spoke with pt. On 01/19/23 on prior admission regarding elevated A1c of 11.0.  Thank you, Billy Fischer. Deadrick Stidd, RN, MSN, CDE  Diabetes Coordinator Inpatient Glycemic Control Team Team Pager [phone] (8am-5pm) 03/14/2023 10:27 AM

## 2023-03-14 NOTE — Progress Notes (Signed)
[facility] PROGRESS NOTE   Leroy Simmons YNW:[id number] DOB: 1971/01/15 DOA: 03/13/2023  PCP: de [place], Raymond J, MD  Brief History/Interval Summary:  52 y.o. male with medical history significant for HFpEF (EF 60 to 65%), nonobstructive CAD, LLE DVT on Eliquis, insulin-dependent T2DM, HTN, CKD stage IIIa, asthma, HLD, depression/anxiety who presented to the ED for evaluation of left-sided chest and mid back pain.  Patient was seen in the ED early morning of 6/9 for evaluation of a small abscess to his mid back.  I&D was performed in the ED with scant bloody drainage.  T-spine x-ray was negative.  Patient was discharged with prescription for Bactrim.  Unfortunately patient has not been able to obtain the antibiotic.  He cites transportation issues as the reason.  He currently stays in a boardinghouse.  He has been having persistent pain over his mid lateral chest.  Also having continued pain in the mid back at the site of his abscess.  He reports subjective fevers and chills.  He has had some nausea.  He reported some shortness of breath earlier but has since improved.  He denies dysuria or diarrhea.  He reports adherence to Eliquis and denies any obvious bleeding. Patient underwent evaluation in the emergency department.  Blood work was unremarkable.  CT chest did not show any significant findings.  Patient was hospitalized and placed on IV antibiotics.    Consultants: None  Procedures: None    Subjective/Interval History: Patient complains of left-sided chest flank and back pain.  No nausea or vomiting.  No dizziness lightheadedness.  No shortness of breath.    Assessment/Plan:  Cellulitis/abscess of mid back with sepsis, present on admission Patient presented with leukocytosis fever and tachycardia.  Has small mid back abscess with surrounding cellulitis.  He underwent I&D on 6/9. CT chest did not show any deep infection. Patient was given ceftriaxone and doxycycline in  the emergency department.  Will change over to linezolid today. Fever curve has improved.  WBC remains elevated.  Procalcitonin 0.1.  Lactic acid level was normal.  Left-sided chest flank and back pain Presumably secondary to his infection.  Could be having referred pain.  CT chest did not show any acute findings.  Troponin levels were insignificantly elevated.  EKG shows T wave inversion in 1 and aVL which is chronic.  Symptoms not suggestive of cardiac etiology. Continue with pain medications.  Diabetes mellitus type 2, uncontrolled with hyperglycemia Presented with glucose level of 402.  Placed on glargine.  Holding metformin.  Monitor CBGs. HbA1c 11.0 in April.  Chronic diastolic CHF Appears to be euvolemic.  Continue with torsemide and metoprolol.  DVT left lower extremity Continue Eliquis.  This was diagnosed in April.  Chronic kidney disease stage IIIa Renal function seems to be close to baseline.  Monitor urine output.  Avoid nephrotoxic agents.  Essential hypertension Continue home medications including metoprolol Imdur and amlodipine.  Left lower lobe pulmonary nodule 9 mm nodule identified.  Seems to be stable compared to 1 year ago.  Follows with Dr. Delton Coombes.  Coronary artery disease Nonobstructive CAD by left heart catheterization in June 2023.  Minimal elevation in troponin is likely of no clinical significance.  Chest discomfort is likely musculoskeletal.  EKG without any new ischemic changes.  Continue medical management for now.  History of anxiety and depression Continue BuSpar.  Hyperlipidemia Continue statin  History of asthma Stable.  Normocytic anemia Likely has anemia of chronic disease.  No evidence for overt blood loss.  Obesity Estimated body mass index is 35.49 kg/m as calculated from the following:   Height as of this encounter: 5' 7.5" (1.715 m).   Weight as of this encounter: 104.3 kg.   DVT Prophylaxis: On Eliquis Code Status: Full  code Family Communication: Discussed with patient Disposition Plan: Hopefully return home when improved  Status is: Inpatient Remains inpatient appropriate because: Sepsis secondary to cellulitis involving back      Medications: Scheduled:  amLODipine  5 mg Oral Daily   apixaban  5 mg Oral BID   atorvastatin  80 mg Oral Daily   insulin aspart  0-15 Units Subcutaneous TID WC   insulin aspart  0-5 Units Subcutaneous QHS   insulin glargine-yfgn  20 Units Subcutaneous QHS   isosorbide mononitrate  30 mg Oral Daily   linezolid  600 mg Oral Q12H   metoprolol succinate  50 mg Oral Daily   sodium chloride flush  3 mL Intravenous Q12H   torsemide  40 mg Oral BID   Continuous: ZOX:[id number] **OR** acetaminophen, albuterol, busPIRone, ondansetron **OR** ondansetron (ZOFRAN) IV, senna-docusate  Antibiotics: Anti-infectives (From admission, onward)    Start     Dose/Rate Route Frequency Ordered Stop   03/14/23 1000  linezolid (ZYVOX) tablet 600 mg        600 mg Oral Every 12 hours 03/14/23 0854 03/21/23 0959   03/14/23 0030  cefTRIAXone (ROCEPHIN) 2 g in sodium chloride 0.9 % 100 mL IVPB  Status:  Discontinued        2 g 200 mL/hr over 30 Minutes Intravenous Every 24 hours 03/14/23 0010 03/14/23 0854   03/13/23 2330  cefTRIAXone (ROCEPHIN) 1 g in sodium chloride 0.9 % 100 mL IVPB  Status:  Discontinued        1 g 200 mL/hr over 30 Minutes Intravenous  Once 03/13/23 2324 03/14/23 0015   03/13/23 1700  doxycycline (VIBRA-TABS) tablet 100 mg        100 mg Oral  Once 03/13/23 1655 03/13/23 1719       Objective:  Vital Signs  Vitals:   03/14/23 0105 03/14/23 0140 03/14/23 0516 03/14/23 0823  BP:  (!) 145/70 (!) 149/72 (!) 161/85  Pulse:  96 99 (!) 107  Resp:  18 18   Temp: 98.9 F (37.2 C) 98.5 F (36.9 C) 100.2 F (37.9 C) 98.3 F (36.8 C)  TempSrc: Oral     SpO2:  94% 91% 95%  Weight:      Height:        Intake/Output Summary (Last 24 hours) at 03/14/2023  0953 Last data filed at 03/14/2023 0500 Gross per 24 hour  Intake 100 ml  Output 300 ml  Net -200 ml   Filed Weights   03/13/23 1626  Weight: 104.3 kg    General appearance: Awake alert.  In no distress Resp: Clear to auscultation bilaterally.  Normal effort Cardio: S1-S2 is normal regular.  No S3-S4.  No rubs murmurs or bruit GI: Abdomen is soft.  Nontender nondistended.  Bowel sounds are present normal.  No masses organomegaly Mildly indurated area without any fluctuation along with surrounding erythema noted in the mid back Extremities: No edema.  Full range of motion of lower extremities. Neurologic: Alert and oriented x3.  No focal neurological deficits.    Lab Results:  Data Reviewed: I have personally reviewed following labs and reports of the imaging studies  CBC: Recent Labs  Lab 03/13/23 1638 03/13/23 2017 03/14/23 0108 03/14/23 0121  WBC 15.7*  --  15.8*  --   HGB 11.2* 10.5* 11.5* 11.6*  HCT 33.7* 31.0* 33.3* 34.0*  MCV 89.4  --  89.0  --   PLT 247  --  200  --     Basic Metabolic Panel: Recent Labs  Lab 03/13/23 1638 03/13/23 2017 03/14/23 0108 03/14/23 0121  NA 137 137 134* 139  K 3.7 3.7 4.1 4.3  CL 96*  --  101  --   CO2 25  --  22  --   GLUCOSE 402*  --  289*  --   BUN 21*  --  23*  --   CREATININE 1.60*  --  1.74*  --   CALCIUM 8.9  --  8.2*  --     GFR: Estimated Creatinine Clearance: 59.6 mL/min (A) (by C-G formula based on SCr of 1.74 mg/dL (H)).    CBG: Recent Labs  Lab 03/13/23 2045 03/14/23 0103 03/14/23 0859  GLUCAP 278* 241* 206*     Radiology Studies: CT Chest W Contrast  Result Date: 03/13/2023 CLINICAL DATA:  Chest wall pain, nontraumatic, infection or inflammation suspected. X-ray done EXAM: CT CHEST WITH CONTRAST TECHNIQUE: Multidetector CT imaging of the chest was performed during intravenous contrast administration. RADIATION DOSE REDUCTION: This exam was performed according to the departmental dose-optimization  program which includes automated exposure control, adjustment of the mA and/or kV according to patient size and/or use of iterative reconstruction technique. CONTRAST:  50mL OMNIPAQUE IOHEXOL 350 MG/ML SOLN COMPARISON:  Chest radiographs 03/13/2023 FINDINGS: Cardiovascular: Normal heart size. No pericardial effusion. Coronary artery atherosclerotic calcification. Mediastinum/Nodes: Trachea and esophagus are unremarkable. No thoracic adenopathy. Lungs/Pleura: Lungs are clear. No pleural effusion or pneumothorax. 9 mm left lower lobe pulmonary nodule (4/99) is stable since 03/18/2022. Upper Abdomen: No acute abnormality. Musculoskeletal: No acute fracture or destructive osseous lesion. Bilateral gynecomastia. Subcutaneous fat stranding in the midline back at the level of T9-T10 and extending inferiorly. No drainable fluid collection or abscess IMPRESSION: 1. Nonspecific subcutaneous fat stranding in the midline back at the level of T9-T10 and extending inferiorly. Correlate for cellulitis or contusion. 2. 9 mm left lower lobe pulmonary nodule, stable since 03/18/2022. Follow-up in 6-12 months (from today's scan) is considered optional for low risk patients, but is recommended for high-risk patients. This recommendation follows the consensus statement: Guidelines for Management of Incidental Pulmonary Nodules Detected on CT Images: From the Fleischner Society 2017; Radiology 2017; 284:228-243. Electronically Signed   By: Minerva Fester M.D.   On: 03/13/2023 22:46   DG Chest 2 View  Result Date: 03/13/2023 CLINICAL DATA:  Chest pain EXAM: CHEST - 2 VIEW COMPARISON:  None Available. FINDINGS: Cardiomegaly. Low lung volumes with hypoventilatory changes. No focal consolidation. No evidence of pleural effusion or pneumothorax. IMPRESSION: Low lung volumes with hypoventilatory changes. No focal consolidation. Electronically Signed   By: Allegra Lai M.D.   On: 03/13/2023 18:21       LOS: 1 day   [employer]  [facility] Pager on [email or web address]  03/14/2023, 9:53 AM

## 2023-03-14 NOTE — Progress Notes (Signed)
Transition of Care [facility]) - Inpatient Brief Assessment   Patient Details  Name: Leroy Simmons MRN: [id number] Date of Birth: 1970/12/06  [facility]) CM/SW Contact:    Janae Bridgeman, RN Phone Number: 03/14/2023, 4:03 PM   Clinical Narrative: No TOC needs determined at this time.  CM will continue to follow the patient for discharge needs.  Resources provided in the AVS for transportation assistance.   Transition of Care Asessment: Insurance and Status: (P) Insurance coverage has been reviewed Patient has primary care physician: (P) Yes Home environment has been reviewed: (P) Yes Prior level of function:: (P) Independent Prior/Current Home Services: (P) No current home services Social Determinants of Health Reivew: (P) SDOH reviewed no interventions necessary Readmission risk has been reviewed: (P) Yes Transition of care needs: (P) no transition of care needs at this time

## 2023-03-15 DIAGNOSIS — A419 Sepsis, unspecified organism: Secondary | ICD-10-CM | POA: Diagnosis not present

## 2023-03-15 DIAGNOSIS — L0390 Cellulitis, unspecified: Secondary | ICD-10-CM | POA: Diagnosis not present

## 2023-03-15 DIAGNOSIS — I5032 Chronic diastolic (congestive) heart failure: Secondary | ICD-10-CM | POA: Diagnosis not present

## 2023-03-15 LAB — CBC
HCT: 35.2 % — ABNORMAL LOW (ref 39.0–52.0)
Hemoglobin: 11.8 g/dL — ABNORMAL LOW (ref 13.0–17.0)
MCH: 29.7 pg (ref 26.0–34.0)
MCHC: 33.5 g/dL (ref 30.0–36.0)
MCV: 88.7 fL (ref 80.0–100.0)
Platelets: 253 10*3/uL (ref 150–400)
RBC: 3.97 MIL/uL — ABNORMAL LOW (ref 4.22–5.81)
RDW: 12.2 % (ref 11.5–15.5)
WBC: 16.6 10*3/uL — ABNORMAL HIGH (ref 4.0–10.5)
nRBC: 0 % (ref 0.0–0.2)

## 2023-03-15 LAB — BASIC METABOLIC PANEL
Anion gap: 13 (ref 5–15)
BUN: 34 mg/dL — ABNORMAL HIGH (ref 6–20)
CO2: 23 mmol/L (ref 22–32)
Calcium: 9 mg/dL (ref 8.9–10.3)
Chloride: 97 mmol/L — ABNORMAL LOW (ref 98–111)
Creatinine, Ser: 2.67 mg/dL — ABNORMAL HIGH (ref 0.61–1.24)
GFR, Estimated: 28 mL/min — ABNORMAL LOW (ref >60)
Glucose, Bld: 250 mg/dL — ABNORMAL HIGH (ref 70–99)
Potassium: 3.8 mmol/L (ref 3.5–5.1)
Sodium: 133 mmol/L — ABNORMAL LOW (ref 135–145)

## 2023-03-15 LAB — GLUCOSE, CAPILLARY
Glucose-Capillary: 239 mg/dL — ABNORMAL HIGH (ref 70–99)
Glucose-Capillary: 243 mg/dL — ABNORMAL HIGH (ref 70–99)
Glucose-Capillary: 195 mg/dL — ABNORMAL HIGH (ref 70–99)
Glucose-Capillary: 220 mg/dL — ABNORMAL HIGH (ref 70–99)
Glucose-Capillary: 195 mg/dL — ABNORMAL HIGH (ref 70–99)

## 2023-03-15 MED ORDER — LACTATED RINGERS IV SOLN: INTRAVENOUS | Status: AC

## 2023-03-15 NOTE — Progress Notes (Signed)
Progress Note    Leroy Simmons  ZOX:[id number] DOB: 1971/08/08  DOA: 03/13/2023 PCP: de [place], Raymond J, MD      Brief Narrative:    Medical records reviewed and are as summarized below:  Leroy Simmons is a 52 y.o. male with medical history significant for HFpEF (EF 60 to 65%), nonobstructive CAD, LLE DVT on Eliquis, insulin-dependent T2DM, HTN, CKD stage IIIa, asthma, HLD, depression/anxiety who presented to the ED for evaluation of left-sided chest and mid back pain.  Patient was seen in the ED early morning of 6/9 for evaluation of a small abscess to his mid back.  I&D was performed in the ED with scant bloody drainage.  T-spine x-ray was negative.  Patient was discharged with prescription for Bactrim.  Unfortunately patient has not been able to obtain the antibiotic.  He cites transportation issues as the reason.  He currently stays in a boardinghouse.  He has been having persistent pain over his mid lateral chest.  Also having continued pain in the mid back at the site of his abscess.  He reports subjective fevers and chills.  He has had some nausea.  He reported some shortness of breath earlier but has since improved.  He denies dysuria or diarrhea.  He reports adherence to Eliquis and denies any obvious bleeding. Patient underwent evaluation in the emergency department.  Blood work was unremarkable.  CT chest did not show any significant findings.  Patient was hospitalized and placed on IV antibiotics.      Assessment/Plan:   Principal Problem:   Sepsis due to cellulitis [facility]) Active Problems:   Uncontrolled type 2 diabetes mellitus with hyperglycemia, with long-term current use of insulin (HCC)   Heart failure with preserved ejection fraction (HCC)   Deep venous thrombosis (DVT) of left peroneal vein (HCC)   Hypertension associated with diabetes (HCC)   Chronic kidney disease, stage 3a (HCC)   Asthma   Hyperlipidemia associated with type 2 diabetes mellitus (HCC)    Abscess of back, except buttock   Anxiety and depression   Coronary artery disease involving native coronary artery of native heart without angina pectoris   Left lower lobe pulmonary nodule    Body mass index is 35.49 kg/m.  (Obesity)    Cellulitis/abscess of mid back with sepsis, present on admission Patient presented with leukocytosis fever and tachycardia.  Has small mid back abscess with surrounding cellulitis.  He underwent I&D on 6/9. CT chest did not show any deep infection. Patient was given ceftriaxone and doxycycline in the emergency department. Continue Zyvox.  Persistent leukocytosis.  Repeat CBC tomorrow Procalcitonin 0.1.  Lactic acid level was normal.   Left-sided chest flank and back pain Presumably secondary to his infection.  Could be having referred pain.  CT chest did not show any acute findings.  Troponin levels were insignificantly elevated.  EKG shows T wave inversion in 1 and aVL which is chronic.  Symptoms not suggestive of cardiac etiology. Continue with pain medications.   Diabetes mellitus type 2, uncontrolled with hyperglycemia Presented with glucose level of 402.  Placed on glargine.  Holding metformin.  Monitor CBGs. HbA1c 11.0 in April.   Chronic diastolic CHF Hold torsemide. Appears to be euvolemic.  Continue metoprolol.   DVT left lower extremity Continue Eliquis.  This was diagnosed in April.   Chronic kidney disease stage IIIa, ?  AKI Creatinine increased from 1.74-2.67.  Hydrate with Ringer's lactate infusion and repeat BMP tomorrow.  Hold torsemide  for now.  Avoid nephrotoxic agents.   Essential hypertension Continue home medications including metoprolol Imdur and amlodipine.   Left lower lobe pulmonary nodule 9 mm nodule identified.  Seems to be stable compared to 1 year ago.  Follows with Dr. Delton Coombes.   Coronary artery disease Nonobstructive CAD by left heart catheterization in June 2023.  Minimal elevation in troponin is likely of  no clinical significance.  Chest discomfort is likely musculoskeletal.  EKG without any new ischemic changes.  Continue medical management for now.   History of anxiety and depression Continue BuSpar.   Hyperlipidemia Continue statin   History of asthma Stable.   Normocytic anemia Likely has anemia of chronic disease.  No evidence for overt blood loss.     Diet Order             Diet renal/carb modified with fluid restriction Diet-HS Snack? Nothing; Fluid restriction: 1200 mL Fluid; Room service appropriate? Yes; Fluid consistency: Thin  Diet effective now                            Consultants: None  Procedures: None    Medications:    amLODipine  5 mg Oral Daily   apixaban  5 mg Oral BID   atorvastatin  80 mg Oral Daily   insulin aspart  0-15 Units Subcutaneous TID WC   insulin aspart  0-5 Units Subcutaneous QHS   insulin glargine-yfgn  10 Units Subcutaneous Daily   insulin glargine-yfgn  20 Units Subcutaneous QHS   isosorbide mononitrate  30 mg Oral Daily   linezolid  600 mg Oral Q12H   metoprolol succinate  50 mg Oral Daily   sodium chloride flush  3 mL Intravenous Q12H   Continuous Infusions:  lactated ringers       Anti-infectives (From admission, onward)    Start     Dose/Rate Route Frequency Ordered Stop   03/14/23 1000  linezolid (ZYVOX) tablet 600 mg        600 mg Oral Every 12 hours 03/14/23 0854 03/21/23 0959   03/14/23 0030  cefTRIAXone (ROCEPHIN) 2 g in sodium chloride 0.9 % 100 mL IVPB  Status:  Discontinued        2 g 200 mL/hr over 30 Minutes Intravenous Every 24 hours 03/14/23 0010 03/14/23 0854   03/13/23 2330  cefTRIAXone (ROCEPHIN) 1 g in sodium chloride 0.9 % 100 mL IVPB  Status:  Discontinued        1 g 200 mL/hr over 30 Minutes Intravenous  Once 03/13/23 2324 03/14/23 0015   03/13/23 1700  doxycycline (VIBRA-TABS) tablet 100 mg        100 mg Oral  Once 03/13/23 1655 03/13/23 1719              Family  Communication/Anticipated D/C date and plan/Code Status   DVT prophylaxis:  apixaban (ELIQUIS) tablet 5 mg     Code Status: Full Code  Family Communication: None Disposition Plan: Plan to discharge home in 1 to 2 days   Status is: Inpatient Remains inpatient appropriate because: AKI, back abscess       Subjective:   He complains of back pain and left flank pain.  Objective:    Vitals:   03/15/23 0436 03/15/23 0747 03/15/23 1227 03/15/23 1629  BP: (!) 152/82 (!) 147/78 120/74 117/70  Pulse: 98 90 84 87  Resp: 18 17 16 15   Temp: 99.1 F (37.3 C) 99.2 F (37.3  C) 98.8 F (37.1 C) 99.6 F (37.6 C)  TempSrc:    Oral  SpO2: 100% 98% 97% 95%  Weight:      Height:       No data found.   Intake/Output Summary (Last 24 hours) at 03/15/2023 1704 Last data filed at 03/15/2023 0444 Gross per 24 hour  Intake --  Output 400 ml  Net -400 ml   Filed Weights   03/13/23 1626  Weight: 104.3 kg    Exam:  GEN: NAD SKIN: Warm and dry.  Open wound on the mid back with surrounding erythema and tenderness.  No drainage noted. EYES: No pallor or icterus ENT: MMM CV: RRR PULM: CTA B ABD: soft, obese, NT, +BS CNS: AAO x 3, non focal EXT: No edema or tenderness        Data Reviewed:   I have personally reviewed following labs and imaging studies:  Labs: Labs show the following:   Basic Metabolic Panel: Recent Labs  Lab 03/13/23 1638 03/13/23 2017 03/14/23 0108 03/14/23 0121 03/15/23 0302  NA 137 137 134* 139 133*  K 3.7 3.7 4.1 4.3 3.8  CL 96*  --  101  --  97*  CO2 25  --  22  --  23  GLUCOSE 402*  --  289*  --  250*  BUN 21*  --  23*  --  34*  CREATININE 1.60*  --  1.74*  --  2.67*  CALCIUM 8.9  --  8.2*  --  9.0   GFR Estimated Creatinine Clearance: 38.9 mL/min (A) (by C-G formula based on SCr of 2.67 mg/dL (H)). Liver Function Tests: No results for input(s): "AST", "ALT", "ALKPHOS", "BILITOT", "PROT", "ALBUMIN" in the last 168 hours. No results  for input(s): "LIPASE", "AMYLASE" in the last 168 hours. No results for input(s): "AMMONIA" in the last 168 hours. Coagulation profile No results for input(s): "INR", "PROTIME" in the last 168 hours.  CBC: Recent Labs  Lab 03/13/23 1638 03/13/23 2017 03/14/23 0108 03/14/23 0121 03/15/23 0302  WBC 15.7*  --  15.8*  --  16.6*  HGB 11.2* 10.5* 11.5* 11.6* 11.8*  HCT 33.7* 31.0* 33.3* 34.0* 35.2*  MCV 89.4  --  89.0  --  88.7  PLT 247  --  200  --  253   Cardiac Enzymes: No results for input(s): "CKTOTAL", "CKMB", "CKMBINDEX", "TROPONINI" in the last 168 hours. BNP (last 3 results) No results for input(s): "PROBNP" in the last 8760 hours. CBG: Recent Labs  Lab 03/14/23 2222 03/15/23 0436 03/15/23 0830 03/15/23 1231 03/15/23 1628  GLUCAP 248* 239* 243* 195* 220*   D-Dimer: No results for input(s): "DDIMER" in the last 72 hours. Hgb A1c: No results for input(s): "HGBA1C" in the last 72 hours. Lipid Profile: No results for input(s): "CHOL", "HDL", "LDLCALC", "TRIG", "CHOLHDL", "LDLDIRECT" in the last 72 hours. Thyroid function studies: No results for input(s): "TSH", "T4TOTAL", "T3FREE", "THYROIDAB" in the last 72 hours.  Invalid input(s): "FREET3" Anemia work up: No results for input(s): "VITAMINB12", "FOLATE", "FERRITIN", "TIBC", "IRON", "RETICCTPCT" in the last 72 hours. Sepsis Labs: Recent Labs  Lab 03/13/23 1638 03/13/23 2156 03/14/23 0108 03/14/23 0653 03/15/23 0302  PROCALCITON  --   --   --  0.10  --   WBC 15.7*  --  15.8*  --  16.6*  LATICACIDVEN  --  0.9  --   --   --     Microbiology No results found for this or any previous visit (from  the past 240 hour(s)).  Procedures and diagnostic studies:  CT Chest W Contrast  Result Date: 03/13/2023 CLINICAL DATA:  Chest wall pain, nontraumatic, infection or inflammation suspected. X-ray done EXAM: CT CHEST WITH CONTRAST TECHNIQUE: Multidetector CT imaging of the chest was performed during intravenous  contrast administration. RADIATION DOSE REDUCTION: This exam was performed according to the departmental dose-optimization program which includes automated exposure control, adjustment of the mA and/or kV according to patient size and/or use of iterative reconstruction technique. CONTRAST:  50mL OMNIPAQUE IOHEXOL 350 MG/ML SOLN COMPARISON:  Chest radiographs 03/13/2023 FINDINGS: Cardiovascular: Normal heart size. No pericardial effusion. Coronary artery atherosclerotic calcification. Mediastinum/Nodes: Trachea and esophagus are unremarkable. No thoracic adenopathy. Lungs/Pleura: Lungs are clear. No pleural effusion or pneumothorax. 9 mm left lower lobe pulmonary nodule (4/99) is stable since 03/18/2022. Upper Abdomen: No acute abnormality. Musculoskeletal: No acute fracture or destructive osseous lesion. Bilateral gynecomastia. Subcutaneous fat stranding in the midline back at the level of T9-T10 and extending inferiorly. No drainable fluid collection or abscess IMPRESSION: 1. Nonspecific subcutaneous fat stranding in the midline back at the level of T9-T10 and extending inferiorly. Correlate for cellulitis or contusion. 2. 9 mm left lower lobe pulmonary nodule, stable since 03/18/2022. Follow-up in 6-12 months (from today's scan) is considered optional for low risk patients, but is recommended for high-risk patients. This recommendation follows the consensus statement: Guidelines for Management of Incidental Pulmonary Nodules Detected on CT Images: From the Fleischner Society 2017; Radiology 2017; 284:228-243. Electronically Signed   By: Minerva Fester M.D.   On: 03/13/2023 22:46   DG Chest 2 View  Result Date: 03/13/2023 CLINICAL DATA:  Chest pain EXAM: CHEST - 2 VIEW COMPARISON:  None Available. FINDINGS: Cardiomegaly. Low lung volumes with hypoventilatory changes. No focal consolidation. No evidence of pleural effusion or pneumothorax. IMPRESSION: Low lung volumes with hypoventilatory changes. No focal  consolidation. Electronically Signed   By: Allegra Lai M.D.   On: 03/13/2023 18:21               LOS: 2 days   Temprance Wyre  [facility]   Pager on [email or web address]. If 7PM-7AM, please contact night-coverage at [email or web address]     03/15/2023, 5:04 PM

## 2023-03-15 NOTE — Evaluation (Signed)
Physical Therapy Evaluation Patient Details Name: Leroy Simmons MRN: [id number] DOB: Nov 22, 1970 Today's Date: 03/15/2023  History of Present Illness  Pt is a 52 y/o M admitted on 03/13/23 after presenting with c/o L sided chest & mid back pain. Pt was seen in the ED early morning of 6/9 for evaluation of small abscess of mid back & I&D was performed & pt d/c. Pt was unable to obtain antibiotic. Pt is now being treated for cellulitis/abscess of mid back with sepsis. PMH: HFpEF, nonobstructive CAD, LLE DVT, IDDM2, CKD 3A, asthma, HLD, depression, anxiety  Clinical Impression  Pt seen for PT evaluation with pt agreeable to tx. Pt reports prior to admission he was independent without AD, denies falls. On this date, pt is able to mobilize at mod I level (decreased gait speed) without AD without overt LOB. Pt does endorse intermittent lightheadedness but notes this is chronic & seems it passes within a few seconds. At this time, pt does not demonstrate any acute PT needs. PT to complete current orders, please re-consult if new needs arise.       Recommendations for follow up therapy are one component of a multi-disciplinary discharge planning process, led by the attending physician.  Recommendations may be updated based on patient status, additional functional criteria and insurance authorization.  Follow Up Recommendations       Assistance Recommended at Discharge PRN  Patient can return home with the following  Assistance with cooking/housework    Equipment Recommendations None recommended by PT  Recommendations for Other Services       Functional Status Assessment Patient has not had a recent decline in their functional status     Precautions / Restrictions Precautions Precautions: None Restrictions Weight Bearing Restrictions: No      Mobility  Bed Mobility               General bed mobility comments: not tested, pt received & left sitting EOB    Transfers Overall  transfer level: Independent Equipment used: None Transfers: Sit to/from Stand Sit to Stand: Independent           General transfer comment: STS from EOB without AD    Ambulation/Gait Ambulation/Gait assistance: Modified independent (Device/Increase time) Gait Distance (Feet): 300 Feet Assistive device: None Gait Pattern/deviations: Decreased dorsiflexion - right, Decreased dorsiflexion - left, Decreased step length - right, Decreased step length - left, Decreased stride length Gait velocity: decreased     General Gait Details: slightly decreased dorsiflexion BLE with slightly decreased heel strike. Pt intermittently reaching for rail in hall but no overt LOB, question if this is habit?  Stairs            Wheelchair Mobility    Modified Rankin (Stroke Patients Only)       Balance Overall balance assessment: Needs assistance   Sitting balance-Leahy Scale: Normal     Standing balance support: No upper extremity supported, During functional activity Standing balance-Leahy Scale: Good                               Pertinent Vitals/Pain Pain Assessment Pain Assessment: Faces Faces Pain Scale: Hurts little more Pain Location: mid back Pain Descriptors / Indicators: Tightness, Discomfort Pain Intervention(s): Monitored during session    Home Living Family/patient expects to be discharged to:: Other (Comment) (boardinghouse) Living Arrangements: Non-relatives/Friends   Type of Home: House Home Access: Ramped entrance       Home  Layout: Other (Comment) (pt reports he does not have to negotiate stairs once in the home)        Prior Function Prior Level of Function : Independent/Modified Independent             Mobility Comments: Pt reports he's independent without AD, denies falls.       Hand Dominance        Extremity/Trunk Assessment   Upper Extremity Assessment Upper Extremity Assessment: Overall WFL for tasks assessed     Lower Extremity Assessment Lower Extremity Assessment: Overall WFL for tasks assessed    Cervical / Trunk Assessment Cervical / Trunk Assessment:  (redness noted to mid back)  Communication   Communication: No difficulties  Cognition Arousal/Alertness: Awake/alert Behavior During Therapy: WFL for tasks assessed/performed Overall Cognitive Status: Within Functional Limits for tasks assessed                                          General Comments      Exercises     Assessment/Plan    PT Assessment Patient does not need any further PT services  PT Problem List         PT Treatment Interventions      PT Goals (Current goals can be found in the Care Plan section)  Acute Rehab PT Goals Patient Stated Goal: for back to get better PT Goal Formulation: With patient Time For Goal Achievement: 03/29/23 Potential to Achieve Goals: Good    Frequency       Co-evaluation               AM-PAC PT "6 Clicks" Mobility  Outcome Measure Help needed turning from your back to your side while in a flat bed without using bedrails?: None Help needed moving from lying on your back to sitting on the side of a flat bed without using bedrails?: None Help needed moving to and from a bed to a chair (including a wheelchair)?: None Help needed standing up from a chair using your arms (e.g., wheelchair or bedside chair)?: None Help needed to walk in hospital room?: None Help needed climbing 3-5 steps with a railing? : None 6 Click Score: 24    End of Session   Activity Tolerance: Patient tolerated treatment well Patient left: in bed;with call bell/phone within reach Nurse Communication: Mobility status      Time: 1610-9604 PT Time Calculation (min) (ACUTE ONLY): 12 min   Charges:   PT Evaluation $PT Eval Low Complexity: 1 Low          Aleda Grana, PT, DPT 03/15/23, 2:54 PM   Sandi Mariscal 03/15/2023, 2:52 PM

## 2023-03-15 NOTE — Inpatient Diabetes Management (Signed)
Inpatient Diabetes Program Recommendations  AACE/ADA: New Consensus Statement on Inpatient Glycemic Control (2015)  Target Ranges:  Prepandial:   less than 140 mg/dL      Peak postprandial:   less than 180 mg/dL (1-2 hours)      Critically ill patients:  140 - 180 mg/dL   Lab Results  Component Value Date   GLUCAP 243 (H) 03/15/2023   HGBA1C 12.4 (A) 02/09/2023    Latest Reference Range & Units 03/14/23 08:59 03/14/23 12:15 03/14/23 17:03 03/14/23 22:22 03/15/23 04:36 03/15/23 08:30  Glucose-Capillary 70 - 99 mg/dL 578 (H) 469 (H) 629 (H) 248 (H) 239 (H) 243 (H)  (H): Data is abnormally high  Diabetes history: DM2 Outpatient Diabetes medications: Lantus 34 units, Humalog 5 tid Current orders for Inpatient glycemic control: Semglee 10 units am, Semglee 20 units qhs, Novolog 0-15 units tid, 0-5 units hs  Inpatient Diabetes Program Recommendations:   Please consider increasing back to home insulin regimen starting 6/13. -Semglee 34 units q hs (has received 10 units this am -Add Novolog 5 units tid meal coverage   Will follow while hospitalized.  Thank you, Leroy Simmons. Kamoria Lucien, RN, MSN, CDE  Diabetes Coordinator Inpatient Glycemic Control Team Team Pager [phone] (8am-5pm) 03/15/2023 10:00 AM

## 2023-03-16 ENCOUNTER — Inpatient Hospital Stay (HOSPITAL_COMMUNITY): Payer: 59

## 2023-03-16 DIAGNOSIS — A419 Sepsis, unspecified organism: Secondary | ICD-10-CM | POA: Diagnosis not present

## 2023-03-16 DIAGNOSIS — I5032 Chronic diastolic (congestive) heart failure: Secondary | ICD-10-CM | POA: Diagnosis not present

## 2023-03-16 DIAGNOSIS — N179 Acute kidney failure, unspecified: Secondary | ICD-10-CM | POA: Insufficient documentation

## 2023-03-16 DIAGNOSIS — L0390 Cellulitis, unspecified: Secondary | ICD-10-CM | POA: Diagnosis not present

## 2023-03-16 LAB — BASIC METABOLIC PANEL
Anion gap: 10 (ref 5–15)
BUN: 40 mg/dL — ABNORMAL HIGH (ref 6–20)
CO2: 25 mmol/L (ref 22–32)
Calcium: 8.2 mg/dL — ABNORMAL LOW (ref 8.9–10.3)
Chloride: 95 mmol/L — ABNORMAL LOW (ref 98–111)
Creatinine, Ser: 3.91 mg/dL — ABNORMAL HIGH (ref 0.61–1.24)
GFR, Estimated: 18 mL/min — ABNORMAL LOW (ref >60)
Glucose, Bld: 276 mg/dL — ABNORMAL HIGH (ref 70–99)
Potassium: 3.5 mmol/L (ref 3.5–5.1)
Sodium: 130 mmol/L — ABNORMAL LOW (ref 135–145)

## 2023-03-16 LAB — CBC
HCT: 30.1 % — ABNORMAL LOW (ref 39.0–52.0)
Hemoglobin: 10.2 g/dL — ABNORMAL LOW (ref 13.0–17.0)
MCH: 29.9 pg (ref 26.0–34.0)
MCHC: 33.9 g/dL (ref 30.0–36.0)
MCV: 88.3 fL (ref 80.0–100.0)
Platelets: 261 10*3/uL (ref 150–400)
RBC: 3.41 MIL/uL — ABNORMAL LOW (ref 4.22–5.81)
RDW: 12.2 % (ref 11.5–15.5)
WBC: 12.2 10*3/uL — ABNORMAL HIGH (ref 4.0–10.5)
nRBC: 0 % (ref 0.0–0.2)

## 2023-03-16 LAB — GLUCOSE, CAPILLARY
Glucose-Capillary: 180 mg/dL — ABNORMAL HIGH (ref 70–99)
Glucose-Capillary: 182 mg/dL — ABNORMAL HIGH (ref 70–99)
Glucose-Capillary: 223 mg/dL — ABNORMAL HIGH (ref 70–99)
Glucose-Capillary: 225 mg/dL — ABNORMAL HIGH (ref 70–99)
Glucose-Capillary: 228 mg/dL — ABNORMAL HIGH (ref 70–99)
Glucose-Capillary: 241 mg/dL — ABNORMAL HIGH (ref 70–99)

## 2023-03-16 MED ORDER — LACTATED RINGERS IV SOLN: INTRAVENOUS | Status: AC

## 2023-03-16 MED ORDER — BISACODYL 10 MG RE SUPP
10.0000 mg | Freq: Once | RECTAL | Status: AC
  Administered 2023-03-16: 10 mg via RECTAL
  Filled 2023-03-16: qty 1

## 2023-03-16 NOTE — Evaluation (Signed)
Occupational Therapy Evaluation Patient Details Name: Leroy Simmons MRN: [id number] DOB: [birth year] Today's Date: 03/16/2023   History of Present Illness Pt is a 52 y/o M admitted on 03/13/23 after presenting with c/o L sided chest & mid back pain. Pt was seen in the ED early morning of 6/9 for evaluation of small abscess of mid back & I&D was performed & pt d/c. Pt was unable to obtain antibiotic. Pt is now being treated for cellulitis/abscess of mid back with sepsis. PMH: HFpEF, nonobstructive CAD, LLE DVT, IDDM2, CKD 3A, asthma, HLD, depression, anxiety   Clinical Impression   PTA pt rents a room and is independent with ADL and mobility.Works as a Network engineer.  Able to complete ADL tasks close to baseline however complains of back pain. Educated on use of AE and compensatory strategies to use to increased comfort during ADL and mobility. Pt verbalized understanding. Complaining of dizziness with mobility. VSS. Pt states he has not walked much - encourage mobility. No further OT needs.      Recommendations for follow up therapy are one component of a multi-disciplinary discharge planning process, led by the attending physician.  Recommendations may be updated based on patient status, additional functional criteria and insurance authorization.   Assistance Recommended at Discharge None  Patient can return home with the following      Functional Status Assessment  Patient has not had a recent decline in their functional status  Equipment Recommendations  None recommended by OT    Recommendations for Other Services       Precautions / Restrictions Precautions Precautions: None Restrictions Weight Bearing Restrictions: No      Mobility Bed Mobility Overal bed mobility: Independent                  Transfers Overall transfer level: Independent Equipment used: None Transfers: Sit to/from Stand Sit to Stand: Independent           General transfer comment: STS  from EOB without AD      Balance Overall balance assessment: Needs assistance   Sitting balance-Leahy Scale: Normal     Standing balance support: No upper extremity supported, During functional activity Standing balance-Leahy Scale: Good                             ADL either performed or assessed with clinical judgement   ADL Overall ADL's : At baseline                                       General ADL Comments: Able to complete ADL tasks however bending over is painful; issued reacher andlong handled sponge to assist with ADL adn IADL tasks     Vision Baseline Vision/History: 1 Wears glasses       Perception     Praxis      Pertinent Vitals/Pain Pain Assessment Pain Assessment: 0-10 Pain Score: 9  Pain Location: mid back Pain Descriptors / Indicators: Tightness, Discomfort Pain Intervention(s): Limited activity within patient's tolerance     Hand Dominance Right   Extremity/Trunk Assessment Upper Extremity Assessment Upper Extremity Assessment: Overall WFL for tasks assessed   Lower Extremity Assessment Lower Extremity Assessment: Defer to PT evaluation   Cervical / Trunk Assessment Cervical / Trunk Assessment:  (redness noted to mid back; distended abdomen)   Communication Communication Communication: No difficulties  Cognition Arousal/Alertness: Awake/alert Behavior During Therapy: WFL for tasks assessed/performed Overall Cognitive Status: Within Functional Limits for tasks assessed                                       General Comments  feeling dizzy; pt has not had a BM in over a week adn nsg aware; States he has not been walking very much; encouraged ambulation    Exercises     Shoulder Instructions      Home Living Family/patient expects to be discharged to:: Private residence (boardinghouse) Living Arrangements: Non-relatives/Friends   Type of Home: House Home Access: Ramped entrance      Home Layout: Other (Comment) (pt reports he does not have to negotiate stairs once in the home)     Bathroom Shower/Tub: Chief Strategy Officer: Standard Bathroom Accessibility: Yes   Home Equipment: None          Prior Functioning/Environment Prior Level of Function : Independent/Modified Independent;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          OT Problem List: Obesity;Pain;Decreased activity tolerance      OT Treatment/Interventions:      OT Goals(Current goals can be found in the care plan section) Acute Rehab OT Goals Patient Stated Goal: to shower OT Goal Formulation: All assessment and education complete, DC therapy  OT Frequency:      Co-evaluation              AM-PAC OT "6 Clicks" Daily Activity     Outcome Measure Help from another person eating meals?: None Help from another person taking care of personal grooming?: None Help from another person toileting, which includes using toliet, bedpan, or urinal?: None Help from another person bathing (including washing, rinsing, drying)?: None Help from another person to put on and taking off regular upper body clothing?: None Help from another person to put on and taking off regular lower body clothing?: None 6 Click Score: 24   End of Session Equipment Utilized During Treatment: Gait belt Nurse Communication: Mobility status  Activity Tolerance: Patient tolerated treatment well Patient left: in bed;with call bell/phone within reach  OT Visit Diagnosis: Pain Pain - part of body:  (back; L side)                Time: 1610-9604 OT Time Calculation (min): 18 min Charges:  OT General Charges $OT Visit: 1 Visit OT Evaluation $OT Eval Low Complexity: 1 Low  Luisa Dago, OT/L   Acute OT Clinical Specialist Acute Rehabilitation Services Pager [phone] Office [phone]   [facility] 03/16/2023,  3:35 PM

## 2023-03-16 NOTE — Plan of Care (Signed)
  Problem: Fluid Volume: Goal: Hemodynamic stability will improve Outcome: Progressing   Problem: Respiratory: Goal: Ability to maintain adequate ventilation will improve Outcome: Progressing   Problem: Education: Goal: Ability to describe self-care measures that may prevent or decrease complications (Diabetes Survival Skills Education) will improve Outcome: Progressing Goal: Individualized Educational Video(s) Outcome: Progressing   Problem: Coping: Goal: Ability to adjust to condition or change in health will improve Outcome: Progressing   Problem: Fluid Volume: Goal: Ability to maintain a balanced intake and output will improve Outcome: Progressing   Problem: Health Behavior/Discharge Planning: Goal: Ability to identify and utilize available resources and services will improve Outcome: Progressing Goal: Ability to manage health-related needs will improve Outcome: Progressing   Problem: Nutritional: Goal: Maintenance of adequate nutrition will improve Outcome: Progressing Goal: Progress toward achieving an optimal weight will improve Outcome: Progressing   Problem: Education: Goal: Knowledge of General Education information will improve Description: Including pain rating scale, medication(s)/side effects and non-pharmacologic comfort measures Outcome: Progressing   Problem: Health Behavior/Discharge Planning: Goal: Ability to manage health-related needs will improve Outcome: Progressing   Problem: Clinical Measurements: Goal: Ability to maintain clinical measurements within normal limits will improve Outcome: Progressing Goal: Respiratory complications will improve Outcome: Progressing Goal: Cardiovascular complication will be avoided Outcome: Progressing   Problem: Activity: Goal: Risk for activity intolerance will decrease Outcome: Progressing   Problem: Nutrition: Goal: Adequate nutrition will be maintained Outcome: Progressing   Problem:  Elimination: Goal: Will not experience complications related to urinary retention Outcome: Progressing   Problem: Safety: Goal: Ability to remain free from injury will improve Outcome: Progressing

## 2023-03-16 NOTE — Inpatient Diabetes Management (Signed)
Inpatient Diabetes Program Recommendations  AACE/ADA: New Consensus Statement on Inpatient Glycemic Control (2015)  Target Ranges:  Prepandial:   less than 140 mg/dL      Peak postprandial:   less than 180 mg/dL (1-2 hours)      Critically ill patients:  140 - 180 mg/dL   Lab Results  Component Value Date   GLUCAP 225 (H) 03/16/2023   HGBA1C 12.4 (A) 02/09/2023    Review of Glycemic Control  Latest Reference Range & Units 03/15/23 08:30 03/15/23 12:31 03/15/23 16:28 03/15/23 20:39 03/16/23 00:06 03/16/23 06:02 03/16/23 07:59  Glucose-Capillary 70 - 99 mg/dL 782 (H) 956 (H) 213 (H) 195 (H) 223 (H) 228 (H) 225 (H)  (H): Data is abnormally high  Diabetes history: DM2 Outpatient Diabetes medications: Lantus 34 units, Humalog 5 tid Current orders for Inpatient glycemic control: Semglee 10 units am, Semglee 20 units qhs, Novolog 0-15 units tid, 0-5 units hs   Inpatient Diabetes Program Recommendations:   Please consider increasing back to home insulin regimen. -Semglee 34 units q hs  -Add Novolog 5 units tid meal coverage   Will continue to follow while inpatient.  Thank you, Dulce Sellar, MSN, CDCES Diabetes Coordinator Inpatient Diabetes Program [phone] (team pager from 8a-5p)

## 2023-03-16 NOTE — Progress Notes (Addendum)
Progress Note    Leroy Simmons  ZOX:[id number] DOB: Apr 27, 1971  DOA: 03/13/2023 PCP: de [place], Raymond J, MD      Brief Narrative:    Medical records reviewed and are as summarized below:  Leroy Simmons is a 52 y.o. male with medical history significant for HFpEF (EF 60 to 65%), nonobstructive CAD, LLE DVT on Eliquis, insulin-dependent T2DM, HTN, CKD stage IIIa, asthma, HLD, depression/anxiety who presented to the ED for evaluation of left-sided chest and mid back pain.  Patient was seen in the ED early morning of 6/9 for evaluation of a small abscess to his mid back.  I&D was performed in the ED with scant bloody drainage.  T-spine x-ray was negative.  Patient was discharged with prescription for Bactrim.  Unfortunately patient has not been able to obtain the antibiotic.  He cites transportation issues as the reason.  He currently stays in a boardinghouse.  He has been having persistent pain over his mid lateral chest.  Also having continued pain in the mid back at the site of his abscess.  He reports subjective fevers and chills.  He has had some nausea.  He reported some shortness of breath earlier but has since improved.  He denies dysuria or diarrhea.  He reports adherence to Eliquis and denies any obvious bleeding. Patient underwent evaluation in the emergency department.  Blood work was unremarkable.  CT chest did not show any significant findings.  Patient was hospitalized and placed on IV antibiotics.      Assessment/Plan:   Principal Problem:   Sepsis due to cellulitis [facility]) Active Problems:   Uncontrolled type 2 diabetes mellitus with hyperglycemia, with long-term current use of insulin (HCC)   Heart failure with preserved ejection fraction (HCC)   Deep venous thrombosis (DVT) of left peroneal vein (HCC)   Hypertension associated with diabetes (HCC)   Chronic kidney disease, stage 3a (HCC)   Asthma   Hyperlipidemia associated with type 2 diabetes mellitus (HCC)    Abscess of back, except buttock   Anxiety and depression   Coronary artery disease involving native coronary artery of native heart without angina pectoris   Left lower lobe pulmonary nodule   AKI (acute kidney injury) (HCC)    Body mass index is 35.49 kg/m.  (Obesity)    Cellulitis/abscess of mid back with sepsis, present on admission Patient presented with leukocytosis fever and tachycardia.  Has small mid back abscess with surrounding cellulitis.  He underwent I&D on 6/9. CT chest did not show any deep infection. Patient was given ceftriaxone and doxycycline in the emergency department. Continue Zyvox.  Persistent leukocytosis.  Repeat CBC tomorrow Procalcitonin 0.1.  Lactic acid level was normal.   Left-sided chest flank and back pain Presumably secondary to his infection.  Could be having referred pain.  CT chest did not show any acute findings.  Troponin levels were insignificantly elevated.  EKG shows T wave inversion in 1 and aVL which is chronic.  Symptoms not suggestive of cardiac etiology. Continue with pain medications.   Diabetes mellitus type 2, uncontrolled with hyperglycemia Presented with glucose level of 402.  Placed on glargine.  Holding metformin.  Monitor CBGs. HbA1c 11.0 in April.   Chronic diastolic CHF Compensated.  Hold torsemide. Continue metoprolol.   DVT left lower extremity (left peroneal veins) Continue Eliquis.  This was diagnosed in April 2024.   AKI on chronic kidney disease stage IIIa Creatinine increased from 2.67-3.91.  Continue IV fluids.  Obtain  renal ultrasound for further evaluation.  Repeat BMP tomorrow.  Will consider nephrology consult if creatinine does not improve tomorrow. Hold torsemide for now.  Avoid nephrotoxic agents.   Essential hypertension Continue home medications including metoprolol Imdur and amlodipine.   Left lower lobe pulmonary nodule 9 mm nodule identified.  Seems to be stable compared to 1 year ago.  Follows  with Dr. Delton Coombes.   Coronary artery disease Nonobstructive CAD by left heart catheterization in June 2023.  Minimal elevation in troponin is likely of no clinical significance.  Chest discomfort is likely musculoskeletal.  EKG without any new ischemic changes.  Continue medical management for now.   History of anxiety and depression Continue BuSpar.   Hyperlipidemia Continue statin   History of asthma Stable.   Normocytic anemia Likely has anemia of chronic disease.  No evidence for overt blood loss.     Diet Order             Diet renal/carb modified with fluid restriction Diet-HS Snack? Nothing; Fluid restriction: 1200 mL Fluid; Room service appropriate? Yes; Fluid consistency: Thin  Diet effective now                            Consultants: None  Procedures: None    Medications:    amLODipine  5 mg Oral Daily   apixaban  5 mg Oral BID   atorvastatin  80 mg Oral Daily   insulin aspart  0-15 Units Subcutaneous TID WC   insulin aspart  0-5 Units Subcutaneous QHS   insulin glargine-yfgn  10 Units Subcutaneous Daily   insulin glargine-yfgn  20 Units Subcutaneous QHS   isosorbide mononitrate  30 mg Oral Daily   linezolid  600 mg Oral Q12H   metoprolol succinate  50 mg Oral Daily   sodium chloride flush  3 mL Intravenous Q12H   Continuous Infusions:  lactated ringers 75 mL/hr at 03/16/23 1610     Anti-infectives (From admission, onward)    Start     Dose/Rate Route Frequency Ordered Stop   03/14/23 1000  linezolid (ZYVOX) tablet 600 mg        600 mg Oral Every 12 hours 03/14/23 0854 03/21/23 0959   03/14/23 0030  cefTRIAXone (ROCEPHIN) 2 g in sodium chloride 0.9 % 100 mL IVPB  Status:  Discontinued        2 g 200 mL/hr over 30 Minutes Intravenous Every 24 hours 03/14/23 0010 03/14/23 0854   03/13/23 2330  cefTRIAXone (ROCEPHIN) 1 g in sodium chloride 0.9 % 100 mL IVPB  Status:  Discontinued        1 g 200 mL/hr over 30 Minutes Intravenous  Once  03/13/23 2324 03/14/23 0015   03/13/23 1700  doxycycline (VIBRA-TABS) tablet 100 mg        100 mg Oral  Once 03/13/23 1655 03/13/23 1719              Family Communication/Anticipated D/C date and plan/Code Status   DVT prophylaxis:  apixaban (ELIQUIS) tablet 5 mg     Code Status: Full Code  Family Communication: None Disposition Plan: Plan to discharge home in 2 to 3 days   Status is: Inpatient Remains inpatient appropriate because: AKI, back abscess       Subjective:   Interval events noted.  He still complains of pain in the mid back and left flank.  Objective:    Vitals:   03/15/23 1629 03/15/23 2128 03/16/23  0557 03/16/23 0800  BP: 117/70 103/71 (!) 110/58 110/64  Pulse: 87 87 84 85  Resp: 15 18 18 18   Temp: 99.6 F (37.6 C) 98.5 F (36.9 C) 98.6 F (37 C) 98.7 F (37.1 C)  TempSrc: Oral     SpO2: 95% 100% 96% 98%  Weight:      Height:       No data found.  No intake or output data in the 24 hours ending 03/16/23 1336  Filed Weights   03/13/23 1626  Weight: 104.3 kg    Exam:  GEN: NAD SKIN: Warm and dry.  Open wound on the mid back with surrounding erythema, tenderness and induration.  No drainage noted. EYES: No pallor or icterus ENT: MMM CV: RRR PULM: CTA B ABD: soft, obese, NT, +BS CNS: AAO x 3, non focal EXT: No edema or tenderness      Data Reviewed:   I have personally reviewed following labs and imaging studies:  Labs: Labs show the following:   Basic Metabolic Panel: Recent Labs  Lab 03/13/23 1638 03/13/23 2017 03/14/23 0108 03/14/23 0121 03/15/23 0302 03/16/23 0423  NA 137 137 134* 139 133* 130*  K 3.7 3.7 4.1 4.3 3.8 3.5  CL 96*  --  101  --  97* 95*  CO2 25  --  22  --  23 25  GLUCOSE 402*  --  289*  --  250* 276*  BUN 21*  --  23*  --  34* 40*  CREATININE 1.60*  --  1.74*  --  2.67* 3.91*  CALCIUM 8.9  --  8.2*  --  9.0 8.2*   GFR Estimated Creatinine Clearance: 26.5 mL/min (A) (by C-G formula  based on SCr of 3.91 mg/dL (H)). Liver Function Tests: No results for input(s): "AST", "ALT", "ALKPHOS", "BILITOT", "PROT", "ALBUMIN" in the last 168 hours. No results for input(s): "LIPASE", "AMYLASE" in the last 168 hours. No results for input(s): "AMMONIA" in the last 168 hours. Coagulation profile No results for input(s): "INR", "PROTIME" in the last 168 hours.  CBC: Recent Labs  Lab 03/13/23 1638 03/13/23 2017 03/14/23 0108 03/14/23 0121 03/15/23 0302 03/16/23 0423  WBC 15.7*  --  15.8*  --  16.6* 12.2*  HGB 11.2* 10.5* 11.5* 11.6* 11.8* 10.2*  HCT 33.7* 31.0* 33.3* 34.0* 35.2* 30.1*  MCV 89.4  --  89.0  --  88.7 88.3  PLT 247  --  200  --  253 261   Cardiac Enzymes: No results for input(s): "CKTOTAL", "CKMB", "CKMBINDEX", "TROPONINI" in the last 168 hours. BNP (last 3 results) No results for input(s): "PROBNP" in the last 8760 hours. CBG: Recent Labs  Lab 03/15/23 2039 03/16/23 0006 03/16/23 0602 03/16/23 0759 03/16/23 1148  GLUCAP 195* 223* 228* 225* 182*   D-Dimer: No results for input(s): "DDIMER" in the last 72 hours. Hgb A1c: No results for input(s): "HGBA1C" in the last 72 hours. Lipid Profile: No results for input(s): "CHOL", "HDL", "LDLCALC", "TRIG", "CHOLHDL", "LDLDIRECT" in the last 72 hours. Thyroid function studies: No results for input(s): "TSH", "T4TOTAL", "T3FREE", "THYROIDAB" in the last 72 hours.  Invalid input(s): "FREET3" Anemia work up: No results for input(s): "VITAMINB12", "FOLATE", "FERRITIN", "TIBC", "IRON", "RETICCTPCT" in the last 72 hours. Sepsis Labs: Recent Labs  Lab 03/13/23 1638 03/13/23 2156 03/14/23 0108 03/14/23 0653 03/15/23 0302 03/16/23 0423  PROCALCITON  --   --   --  0.10  --   --   WBC 15.7*  --  15.8*  --  16.6* 12.2*  LATICACIDVEN  --  0.9  --   --   --   --     Microbiology No results found for this or any previous visit (from the past 240 hour(s)).  Procedures and diagnostic studies:  No results  found.             LOS: 3 days   Elson Ulbrich  [facility]   Pager on [email or web address]. If 7PM-7AM, please contact night-coverage at [email or web address]     03/16/2023, 1:36 PM

## 2023-03-17 DIAGNOSIS — L03312 Cellulitis of back [any part except buttock]: Secondary | ICD-10-CM | POA: Diagnosis not present

## 2023-03-17 LAB — GLUCOSE, CAPILLARY
Glucose-Capillary: 170 mg/dL — ABNORMAL HIGH (ref 70–99)
Glucose-Capillary: 170 mg/dL — ABNORMAL HIGH (ref 70–99)
Glucose-Capillary: 180 mg/dL — ABNORMAL HIGH (ref 70–99)
Glucose-Capillary: 198 mg/dL — ABNORMAL HIGH (ref 70–99)
Glucose-Capillary: 231 mg/dL — ABNORMAL HIGH (ref 70–99)

## 2023-03-17 LAB — BASIC METABOLIC PANEL
Anion gap: 11 (ref 5–15)
BUN: 55 mg/dL — ABNORMAL HIGH (ref 6–20)
CO2: 25 mmol/L (ref 22–32)
Calcium: 8.2 mg/dL — ABNORMAL LOW (ref 8.9–10.3)
Chloride: 97 mmol/L — ABNORMAL LOW (ref 98–111)
Creatinine, Ser: 5.28 mg/dL — ABNORMAL HIGH (ref 0.61–1.24)
GFR, Estimated: 13 mL/min — ABNORMAL LOW (ref >60)
Glucose, Bld: 164 mg/dL — ABNORMAL HIGH (ref 70–99)
Potassium: 3.5 mmol/L (ref 3.5–5.1)
Sodium: 133 mmol/L — ABNORMAL LOW (ref 135–145)

## 2023-03-17 LAB — URINALYSIS, ROUTINE W REFLEX MICROSCOPIC
Bilirubin Urine: NEGATIVE
Glucose, UA: 50 mg/dL — AB
Hgb urine dipstick: NEGATIVE
Ketones, ur: NEGATIVE mg/dL
Leukocytes,Ua: NEGATIVE
Nitrite: NEGATIVE
Protein, ur: >=300 mg/dL — AB
Specific Gravity, Urine: 1.013 (ref 1.005–1.030)
pH: 5 (ref 5.0–8.0)

## 2023-03-17 LAB — CBC
HCT: 26.9 % — ABNORMAL LOW (ref 39.0–52.0)
Hemoglobin: 9 g/dL — ABNORMAL LOW (ref 13.0–17.0)
MCH: 29.6 pg (ref 26.0–34.0)
MCHC: 33.5 g/dL (ref 30.0–36.0)
MCV: 88.5 fL (ref 80.0–100.0)
Platelets: 262 10*3/uL (ref 150–400)
RBC: 3.04 MIL/uL — ABNORMAL LOW (ref 4.22–5.81)
RDW: 12.1 % (ref 11.5–15.5)
WBC: 10.6 10*3/uL — ABNORMAL HIGH (ref 4.0–10.5)
nRBC: 0 % (ref 0.0–0.2)

## 2023-03-17 LAB — SODIUM, URINE, RANDOM: Sodium, Ur: 40 mmol/L

## 2023-03-17 LAB — CREATININE, URINE, RANDOM: Creatinine, Urine: 134 mg/dL

## 2023-03-17 MED ORDER — FUROSEMIDE 10 MG/ML IJ SOLN
80.0000 mg | Freq: Two times a day (BID) | INTRAMUSCULAR | Status: AC
  Administered 2023-03-17 – 2023-03-20 (×7): 80 mg via INTRAVENOUS
  Filled 2023-03-17 (×7): qty 8

## 2023-03-17 NOTE — Plan of Care (Signed)
A/Ox4 and on room air. Continues to have midback pain radiating around to left flank and chest. Running LR at 75. Up with standby assist, and patient calls when help is needed for restroom. Lost a PIV in his sleep this morning, otherwise no overnight issues other than pain.    Problem: Fluid Volume: Goal: Hemodynamic stability will improve Outcome: Progressing   Problem: Clinical Measurements: Goal: Diagnostic test results will improve Outcome: Progressing Goal: Signs and symptoms of infection will decrease Outcome: Progressing   Problem: Respiratory: Goal: Ability to maintain adequate ventilation will improve Outcome: Progressing   Problem: Education: Goal: Ability to describe self-care measures that may prevent or decrease complications (Diabetes Survival Skills Education) will improve Outcome: Progressing Goal: Individualized Educational Video(s) Outcome: Progressing   Problem: Coping: Goal: Ability to adjust to condition or change in health will improve Outcome: Progressing   Problem: Fluid Volume: Goal: Ability to maintain a balanced intake and output will improve Outcome: Progressing   Problem: Health Behavior/Discharge Planning: Goal: Ability to identify and utilize available resources and services will improve Outcome: Progressing Goal: Ability to manage health-related needs will improve Outcome: Progressing   Problem: Metabolic: Goal: Ability to maintain appropriate glucose levels will improve Outcome: Progressing   Problem: Nutritional: Goal: Maintenance of adequate nutrition will improve Outcome: Progressing Goal: Progress toward achieving an optimal weight will improve Outcome: Progressing   Problem: Skin Integrity: Goal: Risk for impaired skin integrity will decrease Outcome: Progressing   Problem: Tissue Perfusion: Goal: Adequacy of tissue perfusion will improve Outcome: Progressing   Problem: Education: Goal: Knowledge of General Education  information will improve Description: Including pain rating scale, medication(s)/side effects and non-pharmacologic comfort measures Outcome: Progressing   Problem: Health Behavior/Discharge Planning: Goal: Ability to manage health-related needs will improve Outcome: Progressing   Problem: Clinical Measurements: Goal: Ability to maintain clinical measurements within normal limits will improve Outcome: Progressing Goal: Will remain free from infection Outcome: Progressing Goal: Diagnostic test results will improve Outcome: Progressing Goal: Respiratory complications will improve Outcome: Progressing Goal: Cardiovascular complication will be avoided Outcome: Progressing   Problem: Activity: Goal: Risk for activity intolerance will decrease Outcome: Progressing   Problem: Nutrition: Goal: Adequate nutrition will be maintained Outcome: Progressing   Problem: Coping: Goal: Level of anxiety will decrease Outcome: Progressing   Problem: Elimination: Goal: Will not experience complications related to bowel motility Outcome: Progressing Goal: Will not experience complications related to urinary retention Outcome: Progressing   Problem: Pain Managment: Goal: General experience of comfort will improve Outcome: Progressing   Problem: Safety: Goal: Ability to remain free from injury will improve Outcome: Progressing   Problem: Skin Integrity: Goal: Risk for impaired skin integrity will decrease Outcome: Progressing

## 2023-03-17 NOTE — Consult Note (Addendum)
[facility] Nephrology Consultation Note  Requesting MD: Dr. Allena Katz  Reason for consult: Worsening AKI on CKD   HPI:  Leroy Simmons is a 52 y.o. male with PMHx of HFpEF (EF 60 to 65%), nonobstructive CAD, LLE DVT on Eliquis, insulin-dependent T2DM, HTN, CKD stage IIIa, asthma, HLD, depression/anxiety who presented initially with back abscess, I&D performed in ED on 6/9 and had bactrim ordered outpatient. Unable to obtain the antibiotic as he had transportation issues. Seems that he continues to have lateral left side/back pain.   CT chest showed nonspecific subcutaneous fat stranding in the midline back at the level of T9-T10 and extending inferiorly.   Now admitted in hospital for continued abx for back abscess and monitoring.   Patient seems to have a baseline Cr function 1-2, now up to 5.28. Patient reports that he thinks he has been told he has had kidney disease but does not feel he his seen a nephrology. No supplement use at home. Is on torsemide at home, has not been receiving here. Normal voids per patient, normal PO intake. Renal U/S negative for hydronephrosis. No UA at present. No documented UOP--~359mL in room. No chronic NSAID use. Does take ACEi for HTN.   Creatinine, Ser  Date/Time Value Ref Range Status  03/17/2023 04:15 AM 5.28 (H) 0.61 - 1.24 mg/dL Final  16/07/9603 54:09 AM 3.91 (H) 0.61 - 1.24 mg/dL Final  81/19/1478 29:56 AM 2.67 (H) 0.61 - 1.24 mg/dL Final  21/30/8657 84:69 AM 1.74 (H) 0.61 - 1.24 mg/dL Final  62/95/2841 32:44 PM 1.60 (H) 0.61 - 1.24 mg/dL Final  10/05/7251 66:44 PM 2.07 (H) 0.61 - 1.24 mg/dL Final  03/47/4259 56:38 AM 1.90 (H) 0.61 - 1.24 mg/dL Final  75/64/3329 51:88 AM 1.78 (H) 0.61 - 1.24 mg/dL Final  41/66/0630 16:01 AM 1.79 (H) 0.61 - 1.24 mg/dL Final  09/32/3557 32:20 AM 1.95 (H) 0.61 - 1.24 mg/dL Final  25/42/7062 37:62 AM 2.12 (H) 0.61 - 1.24 mg/dL Final  83/15/1761 60:73 AM 1.97 (H) 0.61 - 1.24 mg/dL Final  71/03/2693 85:46  AM 1.86 (H) 0.61 - 1.24 mg/dL Final  27/12/5007 38:18 AM 1.71 (H) 0.61 - 1.24 mg/dL Final  29/93/7169 67:89 PM 1.33 (H) 0.61 - 1.24 mg/dL Final  38/07/1750 02:58 AM 2.15 (H) 0.61 - 1.24 mg/dL Final  52/77/8242 35:36 AM 2.92 (H) 0.61 - 1.24 mg/dL Final  14/43/1540 08:67 AM 3.02 (H) 0.61 - 1.24 mg/dL Final  61/95/0932 67:12 PM 2.51 (H) 0.61 - 1.24 mg/dL Final  45/80/9983 38:25 AM 2.06 (H) 0.61 - 1.24 mg/dL Final  05/39/7673 41:93 AM 1.35 (H) 0.61 - 1.24 mg/dL Final  79/11/4095 35:32 PM 1.37 (H) 0.61 - 1.24 mg/dL Final  99/24/2683 41:96 PM 1.34 (H) 0.61 - 1.24 mg/dL Final  22/29/7989 21:19 AM 1.32 (H) 0.61 - 1.24 mg/dL Final    Comment:    RESULTS VERIFIED BY REPEAT TESTING  06/01/2022 09:03 PM 2.61 (H) 0.61 - 1.24 mg/dL Final  41/74/0814 48:18 PM 1.05 0.61 - 1.24 mg/dL Final  56/31/4970 26:37 AM 1.14 0.76 - 1.27 mg/dL Final  85/88/5027 74:12 AM 1.51 (H) 0.61 - 1.24 mg/dL Final  87/86/7672 09:47 AM 1.34 (H) 0.61 - 1.24 mg/dL Final  09/62/8366 29:47 AM 1.28 (H) 0.61 - 1.24 mg/dL Final  65/46/5035 46:56 AM 1.23 0.61 - 1.24 mg/dL Final  81/27/5170 01:74 AM 1.78 (H) 0.61 - 1.24 mg/dL Final  94/49/6759 16:38 AM 1.57 (H) 0.61 - 1.24 mg/dL Final  46/65/9935 70:17 AM 1.48 (H) 0.61 -  1.24 mg/dL Final  16/07/9603 54:09 PM 1.47 (H) 0.61 - 1.24 mg/dL Final  81/19/1478 29:56 PM 1.54 (H) 0.61 - 1.24 mg/dL Final  21/30/8657 84:69 PM 1.06 0.61 - 1.24 mg/dL Final  62/95/2841 32:44 AM 0.87 0.4 - 1.5 mg/dL Final  10/05/7251 66:44 AM 0.8 0.4 - 1.5 mg/dL Final  03/47/4259 56:38 PM 0.6 0.4 - 1.5 mg/dL Final  75/64/3329 51:88 PM 0.84 0.40 - 1.50 mg/dL Final  41/66/0630 16:01 AM 0.72 0.4 - 1.5 mg/dL Final  09/32/3557 32:20 AM 0.69 0.4 - 1.5 mg/dL Final  25/42/7062 37:62 AM 0.68 0.4 - 1.5 mg/dL Final  83/15/1761 60:73 AM 0.81 0.4 - 1.5 mg/dL Final  71/03/2693 85:46 PM 1.07 0.4 - 1.5 mg/dL Final  27/12/5007 38:18 PM 0.73 0.4 - 1.5 mg/dL Final  29/93/7169 67:89 PM 0.86 0.4 - 1.5 mg/dL Final  38/07/1750 02:58  PM 0.7 0.4 - 1.5 mg/dL Final  52/77/8242 35:36 AM 0.68 0.4 - 1.5 mg/dL Final  14/43/1540 08:67 PM 0.84 0.4 - 1.5 mg/dL Final  61/95/0932 67:12 AM 0.77 0.4 - 1.5 mg/dL Final     PMHx:   Past Medical History:  Diagnosis Date   Acute gangrenous appendicitis 09/25/2022   Anxiety    CHF (congestive heart failure) (HCC)    Depression    Diabetes mellitus without complication (HCC)    Hypertension     Past Surgical History:  Procedure Laterality Date   LAPAROSCOPIC APPENDECTOMY N/A 09/26/2022   Procedure: APPENDECTOMY LAPAROSCOPIC;  Surgeon: Campbell Lerner, MD;  Location: [facility] ORS;  Service: General;  Laterality: N/A;   LEFT HEART CATH AND CORONARY ANGIOGRAPHY N/A 03/21/2022   Procedure: LEFT HEART CATH AND CORONARY ANGIOGRAPHY;  Surgeon: Corky Crafts, MD;  Location: [facility];  Service: Cardiovascular;  Laterality: N/A;    Family Hx:  Family History  Problem Relation Age of Onset   Heart failure Mother     Social History:  reports that he has never smoked. He has never used smokeless tobacco. He reports current alcohol use. He reports that he does not use drugs.  Allergies: No Known Allergies  Medications: Prior to Admission medications   Medication Sig Start Date End Date Taking? Authorizing Provider  albuterol (VENTOLIN HFA) 108 (90 Base) MCG/ACT inhaler Inhale 2 puffs into the lungs every 6 (six) hours as needed for wheezing or shortness of breath. 01/13/23  Yes Leslye Peer, MD  apixaban (ELIQUIS) 5 MG TABS tablet Take 2 tablets twice daily until 01/27/23, then continue with one tablet twice daily starting on 01/28/23. Patient taking differently: Take 5 mg by mouth 2 (two) times daily. 01/24/23  Yes Arrien, York Ram, MD  aspirin EC 81 MG tablet Take 81 mg by mouth daily.   Yes [provider]  atorvastatin (LIPITOR) 80 MG tablet Take 1 tablet (80 mg total) by mouth daily. 01/25/23 03/13/24 Yes Arrien, York Ram, MD  furosemide (LASIX)  20 MG tablet Take 20 mg by mouth daily.   Yes [provider]  hydrALAZINE (APRESOLINE) 50 MG tablet Take 1 tablet (50 mg total) by mouth every 8 (eight) hours. 01/24/23 03/13/24 Yes Arrien, York Ram, MD  hydrOXYzine (VISTARIL) 25 MG capsule Take 25 mg by mouth 3 (three) times daily as needed for anxiety.   Yes [provider]  insulin glargine (LANTUS) 100 unit/mL SOPN Inject 34 Units into the skin daily. Patient taking differently: Inject 5 Units into the skin 3 (three) times daily before meals. 02/09/23  Yes Grayce Sessions,  NP  insulin lispro (HUMALOG KWIKPEN) 100 UNIT/ML KwikPen Inject 5 Units into the skin 3 (three) times daily before meals. Patient taking differently: Inject 34 Units into the skin daily. 02/09/23  Yes Grayce Sessions, NP  lisinopril (ZESTRIL) 20 MG tablet Take 20 mg by mouth daily.   Yes [provider]  metoprolol succinate (TOPROL-XL) 50 MG 24 hr tablet Take 50 mg by mouth daily. Take with or immediately following a meal.   Yes [provider]  prednisoLONE acetate (PRED FORTE) 1 % ophthalmic suspension Place 1 drop into the left eye daily. Left eye: 1 drop 4 times daily for 7 days, 1 drop 3 times daily for 7 days, 1 drop 2 times daily for 7 days, 1 drop once daily for 7 days, stop.   Yes [provider]  rosuvastatin (CRESTOR) 20 MG tablet Take 20 mg by mouth daily.   Yes [provider]  torsemide (DEMADEX) 20 MG tablet Take 2 tablets (40 mg total) by mouth 2 (two) times daily. 02/08/23  Yes Sharol Harness, Brittainy M, PA-C  amLODipine (NORVASC) 5 MG tablet Take 1 tablet (5 mg total) by mouth daily. Patient not taking: Reported on 03/14/2023 05/24/22   de [place], Buren Kos, MD  blood glucose meter kit and supplies Dispense based on patient and insurance preference. Use up to four times daily as directed. (FOR ICD-10 E10.9, E11.9). 05/24/22   de [place], Buren Kos, MD  isosorbide mononitrate (IMDUR) 30 MG 24 hr tablet Take 1  tablet (30 mg total) by mouth daily. Patient not taking: Reported on 03/14/2023 01/24/23 03/13/24  Arrien, York Ram, MD  sulfamethoxazole-trimethoprim (BACTRIM DS) 800-160 MG tablet Take 1 tablet by mouth 2 (two) times daily for 7 days. Patient not taking: Reported on 03/14/2023 03/12/23 03/19/23  Carroll Sage, PA-C    I have reviewed the patient's current medications.  Labs:  Results for orders placed or performed during the hospital encounter of 03/13/23 (from the past 48 hour(s))  Glucose, capillary     Status: Abnormal   Collection Time: 03/15/23 12:31 PM  Result Value Ref Range   Glucose-Capillary 195 (H) 70 - 99 mg/dL    Comment: Glucose reference range applies only to samples taken after fasting for at least 8 hours.  Glucose, capillary     Status: Abnormal   Collection Time: 03/15/23  4:28 PM  Result Value Ref Range   Glucose-Capillary 220 (H) 70 - 99 mg/dL    Comment: Glucose reference range applies only to samples taken after fasting for at least 8 hours.  Glucose, capillary     Status: Abnormal   Collection Time: 03/15/23  8:39 PM  Result Value Ref Range   Glucose-Capillary 195 (H) 70 - 99 mg/dL    Comment: Glucose reference range applies only to samples taken after fasting for at least 8 hours.  Glucose, capillary     Status: Abnormal   Collection Time: 03/16/23 12:06 AM  Result Value Ref Range   Glucose-Capillary 223 (H) 70 - 99 mg/dL    Comment: Glucose reference range applies only to samples taken after fasting for at least 8 hours.  CBC     Status: Abnormal   Collection Time: 03/16/23  4:23 AM  Result Value Ref Range   WBC 12.2 (H) 4.0 - 10.5 K/uL   RBC 3.41 (L) 4.22 - 5.81 MIL/uL   Hemoglobin 10.2 (L) 13.0 - 17.0 g/dL   HCT 16.1 (L) 09.6 - 04.5 %   MCV 88.3  80.0 - 100.0 fL   MCH 29.9 26.0 - 34.0 pg   MCHC 33.9 30.0 - 36.0 g/dL   RDW 16.1 09.6 - 04.5 %   Platelets 261 150 - 400 K/uL   nRBC 0.0 0.0 - 0.2 %    Comment: Performed at [facility]  Lab, [address] [place], [place] [address]  Basic metabolic panel     Status: Abnormal   Collection Time: 03/16/23  4:23 AM  Result Value Ref Range   Sodium 130 (L) 135 - 145 mmol/L   Potassium 3.5 3.5 - 5.1 mmol/L   Chloride 95 (L) 98 - 111 mmol/L   CO2 25 22 - 32 mmol/L   Glucose, Bld 276 (H) 70 - 99 mg/dL    Comment: Glucose reference range applies only to samples taken after fasting for at least 8 hours.   BUN 40 (H) 6 - 20 mg/dL   Creatinine, Ser 1.91 (H) 0.61 - 1.24 mg/dL   Calcium 8.2 (L) 8.9 - 10.3 mg/dL   GFR, Estimated 18 (L) >60 mL/min    Comment: (NOTE) Calculated using the CKD-EPI Creatinine Equation (2021)    Anion gap 10 5 - 15    Comment: Performed at [facility] Lab, [address], [place], [place] [address]  Glucose, capillary     Status: Abnormal   Collection Time: 03/16/23  6:02 AM  Result Value Ref Range   Glucose-Capillary 228 (H) 70 - 99 mg/dL    Comment: Glucose reference range applies only to samples taken after fasting for at least 8 hours.  Glucose, capillary     Status: Abnormal   Collection Time: 03/16/23  7:59 AM  Result Value Ref Range   Glucose-Capillary 225 (H) 70 - 99 mg/dL    Comment: Glucose reference range applies only to samples taken after fasting for at least 8 hours.  Glucose, capillary     Status: Abnormal   Collection Time: 03/16/23 11:48 AM  Result Value Ref Range   Glucose-Capillary 182 (H) 70 - 99 mg/dL    Comment: Glucose reference range applies only to samples taken after fasting for at least 8 hours.  Glucose, capillary     Status: Abnormal   Collection Time: 03/16/23  5:06 PM  Result Value Ref Range   Glucose-Capillary 180 (H) 70 - 99 mg/dL    Comment: Glucose reference range applies only to samples taken after fasting for at least 8 hours.  Glucose, capillary     Status: Abnormal   Collection Time: 03/16/23  9:04 PM  Result Value Ref Range   Glucose-Capillary 241 (H) 70 - 99 mg/dL    Comment: Glucose reference range  applies only to samples taken after fasting for at least 8 hours.  Glucose, capillary     Status: Abnormal   Collection Time: 03/17/23 12:41 AM  Result Value Ref Range   Glucose-Capillary 180 (H) 70 - 99 mg/dL    Comment: Glucose reference range applies only to samples taken after fasting for at least 8 hours.  CBC     Status: Abnormal   Collection Time: 03/17/23  4:15 AM  Result Value Ref Range   WBC 10.6 (H) 4.0 - 10.5 K/uL   RBC 3.04 (L) 4.22 - 5.81 MIL/uL   Hemoglobin 9.0 (L) 13.0 - 17.0 g/dL   HCT 56.2 (L) 13.0 - 86.5 %   MCV 88.5 80.0 - 100.0 fL   MCH 29.6 26.0 - 34.0 pg   MCHC 33.5 30.0 -  36.0 g/dL   RDW 19.1 47.8 - 29.5 %   Platelets 262 150 - 400 K/uL   nRBC 0.0 0.0 - 0.2 %    Comment: Performed at [facility], [address] [place], [place] [address]  Basic metabolic panel     Status: Abnormal   Collection Time: 03/17/23  4:15 AM  Result Value Ref Range   Sodium 133 (L) 135 - 145 mmol/L   Potassium 3.5 3.5 - 5.1 mmol/L   Chloride 97 (L) 98 - 111 mmol/L   CO2 25 22 - 32 mmol/L   Glucose, Bld 164 (H) 70 - 99 mg/dL    Comment: Glucose reference range applies only to samples taken after fasting for at least 8 hours.   BUN 55 (H) 6 - 20 mg/dL   Creatinine, Ser 8.65 (H) 0.61 - 1.24 mg/dL   Calcium 8.2 (L) 8.9 - 10.3 mg/dL   GFR, Estimated 13 (L) >60 mL/min    Comment: (NOTE) Calculated using the CKD-EPI Creatinine Equation (2021)    Anion gap 11 5 - 15    Comment: Performed at [facility] Lab, [address], [place], [place] [address]  Glucose, capillary     Status: Abnormal   Collection Time: 03/17/23  4:51 AM  Result Value Ref Range   Glucose-Capillary 170 (H) 70 - 99 mg/dL    Comment: Glucose reference range applies only to samples taken after fasting for at least 8 hours.     ROS:  Pertinent items are noted in HPI.  Physical Exam: Vitals:   03/17/23 0512 03/17/23 0816  BP: 128/83 129/77  Pulse: 77 79  Resp: 18 18  Temp: 98.1 F (36.7 C) 98  F (36.7 C)  SpO2: 94% 99%     General exam: Appears calm and comfortable  Respiratory system: Clear to auscultation. Respiratory effort normal. No wheezing or crackle Cardiovascular system: S1 & S2 heard, RRR.  1-2+ pedal edema.  Gastrointestinal system: Abdomen is nondistended, soft and nontender. Normal bowel sounds heard. Central nervous system: Alert and oriented. No focal neurological deficits. Extremities: Symmetric 5 x 5 power. Skin: Well circumscribed area of erythema with scabbing mid thoracic spine Psychiatry: Judgement and insight appear normal. Mood & affect appropriate.   Assessment/Plan: Renal- AKI on CKD IIIa of unknown cause. Will order UA to assess for proteinuria/hematuria. Linezolid can rarely cause AIN? No evidence of hypotension over the course of his stay. Does not appear obstructive in nature but will order bladder scan and PVR given back abscess could cause obstructive process. Possible ATN from sepsis. Appears to be third spacing possibly contributing to AKI, encouraged PO intake. Would initiate diuresis while cautious of kidney function. Added strict I/Os and daily weights as well. No evidence of uremia, BUN 40>55, Cr 3.91>5.28. Na 133, other lytes OK. Bicarb 25 and steady.   2. Hypertension/volume  - Normotensive, third spacing volume, see above. Currently on Imdur and Metoprolol. Holding Torsemide and Lisinopril.  3. Anemia  - Hgb 9, normocytic. Ordered iron studies.  4. Electrolyte monitoring with phos and mag as well.  5. H/o DVT--AC per primary 6. HFpEF  CAD-- holding torsemide at the moment, considering diuresis.  7. Uncontrolled DM2--likely contributor to CKD, per primary.    Recommendations: Lasix IV 80 BID Strict I/Os Phos, mag, bmp, cbc routinely U na and U cr UA Bladder scans  PVR Daily weights  Iron studies Will follow     Alfredo Martinez MD PGY2 [facility]MT[facility] 03/17/2023, 11:09 AM  [facility].

## 2023-03-17 NOTE — Progress Notes (Signed)
PROGRESS NOTE   Leroy Simmons  ZOX:[id number] DOB: Mar 02, 1971 DOA: 03/13/2023 PCP: de Peru, Raymond J, MD   Date of Service: the patient was seen and examined on 03/17/2023  Brief Narrative:   Leroy Simmons is a 52 y.o. male with medical history significant for HFpEF (EF 60 to 65%), nonobstructive CAD, LLE DVT on Eliquis, insulin-dependent T2DM, HTN, CKD stage IIIa, asthma, HLD, depression/anxiety who presented to the ED for evaluation of left-sided chest and mid back pain.  Patient was seen in the ED early morning of 6/9 for evaluation of a small abscess to his mid back.  I&D was performed in the ED with scant bloody drainage.  T-spine x-ray was negative.  Patient was discharged with prescription for Bactrim.  Unfortunately patient has not been able to obtain the antibiotic.  He cites transportation issues as the reason.  He currently stays in a boardinghouse.  He has been having persistent pain over his mid lateral chest.  Also having continued pain in the mid back at the site of his abscess.  He reports subjective fevers and chills.  He has had some nausea.  He reported some shortness of breath earlier but has since improved.  He denies dysuria or diarrhea.  He reports adherence to Eliquis and denies any obvious bleeding. Patient underwent evaluation in the emergency department.  Blood work was unremarkable.  CT chest did not show any significant findings.  Patient was hospitalized and placed on IV antibiotics.    Assessment and Plan:   Cellulitis/abscess of mid back with sepsis, present on admission, treating  Initially presented with fever, tachycardia, back pain and leukocytosis. Found to have small mid back abscess with surrounding cellulitis. S/p I&D on 6/9.CT chest did not show any deep infection.  WBC 10.6 todau improved from 12.2. Day 3 of Zyvox -Continue Zyvox -Monitor with CBC   AKI on chronic kidney disease stage IIIa, treating  Creatinine 5.28 this morning, worsened from 3.91  yesterday.  Baseline appears to be 1.7-2. Renal ultrasound on 6/13 showed no acute findings, no hydronephrosis -Nephrology consult -Continue IV fluids. -Continue to monitor with BMP -Hold nephrotoxic agents-NSAIDs, lisinopril etc     Left-sided chest flank and back pain, stable Pt reports on going intermittent left sided chest pain which started with the back pain. It is characterized by a  "squeezing" type of pain. Worse on movement and eating. Chest pain is likely MSK or related to his infection. Troponin levels on admission were insignificantly elevated.  EKG shows T wave inversion in 1 and aVL which is chronic. CT chest did not show any acute findings. VS stable. Overall symptoms not suggestive of cardiac etiology. -Continue with pain medications -Continue to monitor -Explained to patient that if chest pain changes in character and intensity then he should let RN know    Diabetes mellitus type 2, uncontrolled with hyperglycemia, treating  CBGs overnight 170.  Last A1c 11 in April Placed on glargine.  Holding metformin.   -Monitor CBGs -Continue to hold metformin -Continue insulin sliding scale -Semglee 10 units daily -Semglee 20 units at bedtime.  Chronic diastolic CHF, stable  Compensated.  Hold torsemide in the setting of worsening kidney function -Continue metoprolol.   DVT left lower extremity (left peroneal veins), stable Diagnosed in April 2024. -Continue Eliquis.   Essential hypertension, stable BP 128/83 -Continue home medications including metoprolol Imdur and amlodipine.   Left lower lobe pulmonary nodule, stable 9 mm nodule identified.  Seems to be stable compared to  1 year ago.  Follows with Dr. Delton Coombes.   Coronary artery disease, stable Nonobstructive CAD by left heart catheterization in June 2023.  Minimal elevation in troponin is likely of no clinical significance.  Chest discomfort is likely musculoskeletal.  EKG without any new ischemic changes.  -Continue  medical management for now.   History of anxiety and depression, stable -Continue BuSpar.   Hyperlipidemia, stable -Continue statin   History of asthma, stable Stable.   Normocytic anemia, stable Likely has anemia of chronic disease.  No evidence for overt blood loss.   Subjective:  Pt reports he feels better compared to on admission. He reports swollen legs bilaterally. I explained this is because we are holding his torsemide due to deranged kidney function.  Physical Exam:  Vitals:   03/16/23 0557 03/16/23 0800 03/16/23 2101 03/17/23 0512  BP: (!) 110/58 110/64 126/75 128/83  Pulse: 84 85 82 77  Resp: 18 18 16 18   Temp: 98.6 F (37 C) 98.7 F (37.1 C) 99 F (37.2 C) 98.1 F (36.7 C)  TempSrc:   Oral Oral  SpO2: 96% 98% 95% 94%  Weight:      Height:       General: Alert, no acute distress, pleasant, well appearing  Cardio: Normal S1 and S2, RRR, no r/m/g Pulm: CTAB, normal work of breathing Abdomen: Bowel sounds normal. Abdomen soft and non-tender.  Extremities: No peripheral edema.  Neuro: Cranial nerves grossly intact  MSK: abscess over mid thoracic spine circumscribed with area of erythema, mild TTP, no fluctuance   Data Reviewed:  I have personally reviewed and interpreted labs, imaging.  CBC: Recent Labs  Lab 03/13/23 1638 03/13/23 2017 03/14/23 0108 03/14/23 0121 03/15/23 0302 03/16/23 0423 03/17/23 0415  WBC 15.7*  --  15.8*  --  16.6* 12.2* 10.6*  HGB 11.2*   < > 11.5* 11.6* 11.8* 10.2* 9.0*  HCT 33.7*   < > 33.3* 34.0* 35.2* 30.1* 26.9*  MCV 89.4  --  89.0  --  88.7 88.3 88.5  PLT 247  --  200  --  253 261 262   < > = values in this interval not displayed.   Basic Metabolic Panel: Recent Labs  Lab 03/13/23 1638 03/13/23 2017 03/14/23 0108 03/14/23 0121 03/15/23 0302 03/16/23 0423 03/17/23 0415  NA 137   < > 134* 139 133* 130* 133*  K 3.7   < > 4.1 4.3 3.8 3.5 3.5  CL 96*  --  101  --  97* 95* 97*  CO2 25  --  22  --  23 25 25    GLUCOSE 402*  --  289*  --  250* 276* 164*  BUN 21*  --  23*  --  34* 40* 55*  CREATININE 1.60*  --  1.74*  --  2.67* 3.91* 5.28*  CALCIUM 8.9  --  8.2*  --  9.0 8.2* 8.2*   < > = values in this interval not displayed.   GFR: Estimated Creatinine Clearance: 19.7 mL/min (A) (by C-G formula based on SCr of 5.28 mg/dL (H)). Liver Function Tests: No results for input(s): "AST", "ALT", "ALKPHOS", "BILITOT", "PROT", "ALBUMIN" in the last 168 hours.  Coagulation Profile: No results for input(s): "INR", "PROTIME" in the last 168 hours.  Code Status:  Full code.  Code status decision has been confirmed with: patient  Family Communication:     Severity of Illness:  The appropriate patient status for this patient is INPATIENT. Inpatient status is judged to be  reasonable and necessary in order to provide the required intensity of service to ensure the patient's safety. The patient's presenting symptoms, physical exam findings, and initial radiographic and laboratory data in the context of their chronic comorbidities is felt to place them at high risk for further clinical deterioration. Furthermore, it is not anticipated that the patient will be medically stable for discharge from the hospital within 2 midnights of admission.   * I certify that at the point of admission it is my clinical judgment that the patient will require inpatient hospital care spanning beyond 2 midnights from the point of admission due to high intensity of service, high risk for further deterioration and high frequency of surveillance required.*   Author:  Rolm Gala MD  03/17/2023 7:43 AM

## 2023-03-18 DIAGNOSIS — L03312 Cellulitis of back [any part except buttock]: Secondary | ICD-10-CM | POA: Diagnosis not present

## 2023-03-18 LAB — BASIC METABOLIC PANEL
Anion gap: 13 (ref 5–15)
BUN: 57 mg/dL — ABNORMAL HIGH (ref 6–20)
CO2: 24 mmol/L (ref 22–32)
Calcium: 8.6 mg/dL — ABNORMAL LOW (ref 8.9–10.3)
Chloride: 96 mmol/L — ABNORMAL LOW (ref 98–111)
Creatinine, Ser: 4.12 mg/dL — ABNORMAL HIGH (ref 0.61–1.24)
GFR, Estimated: 17 mL/min — ABNORMAL LOW (ref >60)
Glucose, Bld: 203 mg/dL — ABNORMAL HIGH (ref 70–99)
Potassium: 3.7 mmol/L (ref 3.5–5.1)
Sodium: 133 mmol/L — ABNORMAL LOW (ref 135–145)

## 2023-03-18 LAB — GLUCOSE, CAPILLARY
Glucose-Capillary: 197 mg/dL — ABNORMAL HIGH (ref 70–99)
Glucose-Capillary: 200 mg/dL — ABNORMAL HIGH (ref 70–99)
Glucose-Capillary: 212 mg/dL — ABNORMAL HIGH (ref 70–99)
Glucose-Capillary: 226 mg/dL — ABNORMAL HIGH (ref 70–99)
Glucose-Capillary: 233 mg/dL — ABNORMAL HIGH (ref 70–99)

## 2023-03-18 LAB — CBC
HCT: 30.3 % — ABNORMAL LOW (ref 39.0–52.0)
Hemoglobin: 10.3 g/dL — ABNORMAL LOW (ref 13.0–17.0)
MCH: 29.5 pg (ref 26.0–34.0)
MCHC: 34 g/dL (ref 30.0–36.0)
MCV: 86.8 fL (ref 80.0–100.0)
Platelets: 336 10*3/uL (ref 150–400)
RBC: 3.49 MIL/uL — ABNORMAL LOW (ref 4.22–5.81)
RDW: 12.1 % (ref 11.5–15.5)
WBC: 10.1 10*3/uL (ref 4.0–10.5)
nRBC: 0 % (ref 0.0–0.2)

## 2023-03-18 LAB — RETICULOCYTES
Immature Retic Fract: 20.5 % — ABNORMAL HIGH (ref 2.3–15.9)
RBC.: 3.4 MIL/uL — ABNORMAL LOW (ref 4.22–5.81)
Retic Count, Absolute: 52.7 10*3/uL (ref 19.0–186.0)
Retic Ct Pct: 1.6 % (ref 0.4–3.1)

## 2023-03-18 LAB — IRON AND TIBC
Iron: 46 ug/dL (ref 45–182)
Saturation Ratios: 19 % (ref 17.9–39.5)
TIBC: 246 ug/dL — ABNORMAL LOW (ref 250–450)
UIBC: 200 ug/dL

## 2023-03-18 LAB — FERRITIN: Ferritin: 220 ng/mL (ref 24–336)

## 2023-03-18 MED ORDER — INSULIN GLARGINE-YFGN 100 UNIT/ML ~~LOC~~ SOLN
13.0000 [IU] | Freq: Every day | SUBCUTANEOUS | Status: DC
  Administered 2023-03-18: 13 [IU] via SUBCUTANEOUS
  Filled 2023-03-18 (×2): qty 0.13

## 2023-03-18 MED ORDER — LACTULOSE 10 GM/15ML PO SOLN
20.0000 g | Freq: Once | ORAL | Status: AC
  Administered 2023-03-18: 20 g via ORAL
  Filled 2023-03-18: qty 30

## 2023-03-18 NOTE — Progress Notes (Addendum)
PROGRESS NOTE   Leroy Simmons  ZOX:[id number] DOB: 01/17/1971 DOA: 03/13/2023 PCP: de [place], Raymond J, MD   Date of Service: the patient was seen and examined on 03/18/2023  Brief Narrative:   Leroy Simmons is a 52 y.o. male with medical history significant for HFpEF (EF 60 to 65%), nonobstructive CAD, LLE DVT on Eliquis, insulin-dependent T2DM, HTN, CKD stage IIIa, asthma, HLD, depression/anxiety who presented to the ED for evaluation of left-sided chest and mid back pain. Patient was seen in the ED early morning of 6/9 for evaluation of a small abscess to his mid back.  I&D was performed in the ED with scant bloody drainage. T-spine x-ray was negative.  Patient was discharged with prescription for Bactrim. Unfortunately patient has not been able to obtain the antibiotic. Patient was hospitalized and placed on IV antibiotics.    Assessment and Plan:   Cellulitis/abscess of mid back with sepsis, present on admission, treating  S/p I&D on 6/9.  WBC 10.3 today improved from 10.6. Afebrile. Day 4/7 of Zyvox. Increasing erythema around abscess site however pt still feels well and no increase in pain at site.  -Monitor site closely, RN to mark around the circumference of it, may need to change abx if worsening erythema  -Continue wound care per RN  -Continue Zyvox -Monitor with CBC  AKI on chronic kidney disease stage IIIa, treating  Creatinine 4.12, GFR 17 this morning, improved from 5.28  and 13 yesterday respectively.  Baseline appears to be 1.7-2.  Nephro consulted yesterday: No acute indications for dialysis, suspect it is AKI may be due to combination of ATN associated with sepsis and hemodynamically mediated from exacerbation of diastolic heart failure.  They started IV furosemide 80 mg twice daily which seems to be improving urine output and volume status -Nephrology following appreciate recs  -Monitor Is and Os -Daily weights  -Continue to monitor with BMP -Hold nephrotoxic  agents-NSAIDs, lisinopril etc  -Continue diuresis per nephro   Diabetes mellitus type 2, uncontrolled with hyperglycemia, treating  CBGs overnight slightly elevated to 200-212.  Last A1c 11 in April  Holding metformin.   -Monitor CBGs -Continue to hold metformin -Continue insulin sliding scale -Increase Semglee to 13 units daily -Semglee 20 units at bedtime  Left-sided chest flank and back pain, resolved  stable -Continue with pain medications -Explained to patient that if chest pain changes in character and intensity then he should let RN know   Chronic diastolic CHF, stable   -Continue metoprolol -Diuresis as above  Normocytic anemia, stable Hb 10.3, 9-11. Baseline is Likely has anemia of chronic disease.  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 -Continue Eliquis.   Essential hypertension, stable Normotensive at 122/72 -Continue home medications including metoprolol Imdur and amlodipine.   Left lower lobe pulmonary nodule, stable 9 mm nodule identified.  Seems to be stable compared to 1 year ago.  Follows with Dr. Delton Coombes.   Coronary artery disease, stable Nonobstructive CAD by left heart catheterization in June 2023.  Minimal elevation in troponin is likely of no clinical significance.  Chest discomfort is likely musculoskeletal.  EKG without any new ischemic changes.   -Continue medical management for now.   History of anxiety and depression, stable -Continue BuSpar.   Hyperlipidemia, stable -Continue statin   History of asthma, stable    Subjective:   Doing well. Responding well to diuresis. Friend present at  bedside.  Physical Exam:  Vitals:   03/17/23 0816 03/17/23 1715 03/17/23 2121 03/18/23 0447  BP: 129/77 130/83 120/81 122/72  Pulse: 79 79 73 76  Resp: 18 18 18 17   Temp: 98 F (36.7 C)  98.6 F (37 C) 99 F (37.2 C)  TempSrc:   Oral Oral  SpO2: 99%  100% 97% 99%  Weight:      Height:       General: Alert, no acute distress, pleasant, well appearing  Cardio: Normal S1 and S2, RRR, no r/m/g Pulm: CTAB, normal work of breathing Abdomen: Bowel sounds normal. Abdomen soft and non-tender.  Extremities: No peripheral edema.  Neuro: Cranial nerves grossly intact  MSK: abscess over mid thoracic spine circumscribed with area of erythema increased compared to previous exam, mild TTP, no fluctuance     Data Reviewed:  I have personally reviewed and interpreted labs, imaging.  CBC: Recent Labs  Lab 03/14/23 0108 03/14/23 0121 03/15/23 0302 03/16/23 0423 03/17/23 0415 03/18/23 0349  WBC 15.8*  --  16.6* 12.2* 10.6* 10.1  HGB 11.5* 11.6* 11.8* 10.2* 9.0* 10.3*  HCT 33.3* 34.0* 35.2* 30.1* 26.9* 30.3*  MCV 89.0  --  88.7 88.3 88.5 86.8  PLT 200  --  253 261 262 336    Basic Metabolic Panel: Recent Labs  Lab 03/14/23 0108 03/14/23 0121 03/15/23 0302 03/16/23 0423 03/17/23 0415 03/18/23 0349  NA 134* 139 133* 130* 133* 133*  K 4.1 4.3 3.8 3.5 3.5 3.7  CL 101  --  97* 95* 97* 96*  CO2 22  --  23 25 25 24   GLUCOSE 289*  --  250* 276* 164* 203*  BUN 23*  --  34* 40* 55* 57*  CREATININE 1.74*  --  2.67* 3.91* 5.28* 4.12*  CALCIUM 8.2*  --  9.0 8.2* 8.2* 8.6*    GFR: Estimated Creatinine Clearance: 25.2 mL/min (A) (by C-G formula based on SCr of 4.12 mg/dL (H)). Liver Function Tests: No results for input(s): "AST", "ALT", "ALKPHOS", "BILITOT", "PROT", "ALBUMIN" in the last 168 hours.  Coagulation Profile: No results for input(s): "INR", "PROTIME" in the last 168 hours.  Code Status:  Full code.  Code status decision has been confirmed with: patient  Family Communication:     Severity of Illness:  The appropriate patient status for this patient is INPATIENT. Inpatient status is judged to be reasonable and necessary in order to provide the required intensity of service to ensure the patient's safety. The patient's  presenting symptoms, physical exam findings, and initial radiographic and laboratory data in the context of their chronic comorbidities is felt to place them at high risk for further clinical deterioration. Furthermore, it is not anticipated that the patient will be medically stable for discharge from the hospital within 2 midnights of admission.   * I certify that at the point of admission it is my clinical judgment that the patient will require inpatient hospital care spanning beyond 2 midnights from the point of admission due to high intensity of service, high risk for further deterioration and high frequency of surveillance required.*   Author:  Rolm Gala MD  03/18/2023 8:09 AM

## 2023-03-18 NOTE — Progress Notes (Signed)
Patient ID: Leroy Simmons, male   DOB: 10/13/1970, 52 y.o.   MRN: [id number] [facility] Progress Note   Assessment/ Plan:   1. Acute kidney Injury on chronic kidney disease stage IIIa: Urinalysis from yesterday concerning for proteinuria which will be quantified (likely associated with his underlying type 2 diabetes mellitus that has been uncontrolled).  Microscopy/sediment otherwise bland and not indicative of acute GN.  Overnight with improvement of renal function with restarting diuresis suggesting that acute injury may indeed have been from ATN in the setting of sepsis +/- exacerbation of diastolic heart failure.  No acute indication for dialysis at this time. 2.  Sepsis secondary to abscess/cellulitis of mid back: Appears to be showing clinical improvement with ongoing linezolid.  Status post incision and drainage. 3.  Hypertension: Blood pressure currently at goal, monitor with ongoing diuresis. 4.  Hyponatremia: Secondary to acute kidney injury and impaired free water handling/unrestricted intake.  Continue fluid restriction of 1.2 L/day and monitor with diuresis. 5.  Constipation: Will order single dose of lactulose.  Subjective:   Complains of abdominal discomfort secondary to constipation.  Denies any worsening shortness of breath overnight.   Objective:   BP 122/72 (BP Location: Left Arm)   Pulse 76   Temp 99 F (37.2 C) (Oral)   Resp 17   Ht 5' 7.5" (1.715 m)   Wt 104.3 kg   SpO2 99%   BMI 35.49 kg/m   Intake/Output Summary (Last 24 hours) at 03/18/2023 0818 Last data filed at 03/18/2023 0556 Gross per 24 hour  Intake 3046.02 ml  Output 3150 ml  Net -103.98 ml   Weight change:   Physical Exam: Gen: Comfortably sitting up in recliner CVS: Pulse regular rhythm, normal rate, S1 and S2 normal Resp: Clear to auscultation bilaterally, no rales/rhonchi Abd: Moderately distended, mildly tender over lower quadrants.  Bowel sounds normal Ext: 3+ bilateral  pitting edema  Imaging: US RENAL  Result Date: 03/16/2023 CLINICAL DATA:  Acute kidney injury EXAM: RENAL / URINARY TRACT ULTRASOUND COMPLETE COMPARISON:  01/18/2023 FINDINGS: Right Kidney: Renal measurements: 11.3 x 6.1 x 7.3 cm = volume: 263 mL. Echogenicity within normal limits. No mass or hydronephrosis visualized. Left Kidney: Renal measurements: 12.7 x 6.8 x 5.5 cm = volume: 248 mL. Echogenicity within normal limits. No mass or hydronephrosis visualized. Bladder: Appears normal for degree of bladder distention. Other: None. IMPRESSION: No acute findings.  No hydronephrosis. Electronically Signed   By: Charlett Nose M.D.   On: 03/16/2023 22:14    Labs: BMET Recent Labs  Lab 03/13/23 1638 03/13/23 2017 03/14/23 0108 03/14/23 0121 03/15/23 0302 03/16/23 0423 03/17/23 0415 03/18/23 0349  NA 137 137 134* 139 133* 130* 133* 133*  K 3.7 3.7 4.1 4.3 3.8 3.5 3.5 3.7  CL 96*  --  101  --  97* 95* 97* 96*  CO2 25  --  22  --  23 25 25 24   GLUCOSE 402*  --  289*  --  250* 276* 164* 203*  BUN 21*  --  23*  --  34* 40* 55* 57*  CREATININE 1.60*  --  1.74*  --  2.67* 3.91* 5.28* 4.12*  CALCIUM 8.9  --  8.2*  --  9.0 8.2* 8.2* 8.6*   CBC Recent Labs  Lab 03/15/23 0302 03/16/23 0423 03/17/23 0415 03/18/23 0349  WBC 16.6* 12.2* 10.6* 10.1  HGB 11.8* 10.2* 9.0* 10.3*  HCT 35.2* 30.1* 26.9* 30.3*  MCV 88.7 88.3 88.5 86.8  PLT  253 261 262 336    Medications:     amLODipine  5 mg Oral Daily   apixaban  5 mg Oral BID   atorvastatin  80 mg Oral Daily   furosemide  80 mg Intravenous BID   insulin aspart  0-15 Units Subcutaneous TID WC   insulin aspart  0-5 Units Subcutaneous QHS   insulin glargine-yfgn  10 Units Subcutaneous Daily   insulin glargine-yfgn  20 Units Subcutaneous QHS   isosorbide mononitrate  30 mg Oral Daily   lactulose  20 g Oral Once   linezolid  600 mg Oral Q12H   metoprolol succinate  50 mg Oral Daily   sodium chloride flush  3 mL Intravenous Q12H   Zetta Bills,  MD 03/18/2023, 8:18 AM

## 2023-03-18 NOTE — Plan of Care (Signed)

## 2023-03-19 ENCOUNTER — Inpatient Hospital Stay (HOSPITAL_COMMUNITY): Payer: 59

## 2023-03-19 DIAGNOSIS — A419 Sepsis, unspecified organism: Secondary | ICD-10-CM | POA: Diagnosis not present

## 2023-03-19 DIAGNOSIS — L03312 Cellulitis of back [any part except buttock]: Secondary | ICD-10-CM | POA: Diagnosis not present

## 2023-03-19 LAB — GLUCOSE, CAPILLARY
Glucose-Capillary: 305 mg/dL — ABNORMAL HIGH (ref 70–99)
Glucose-Capillary: 257 mg/dL — ABNORMAL HIGH (ref 70–99)
Glucose-Capillary: 200 mg/dL — ABNORMAL HIGH (ref 70–99)
Glucose-Capillary: 382 mg/dL — ABNORMAL HIGH (ref 70–99)

## 2023-03-19 LAB — CBC
HCT: 36 % — ABNORMAL LOW (ref 39.0–52.0)
Hemoglobin: 12.3 g/dL — ABNORMAL LOW (ref 13.0–17.0)
MCH: 29.4 pg (ref 26.0–34.0)
MCHC: 34.2 g/dL (ref 30.0–36.0)
MCV: 86.1 fL (ref 80.0–100.0)
Platelets: 398 10*3/uL (ref 150–400)
RBC: 4.18 MIL/uL — ABNORMAL LOW (ref 4.22–5.81)
RDW: 12.1 % (ref 11.5–15.5)
WBC: 10.5 10*3/uL (ref 4.0–10.5)
nRBC: 0 % (ref 0.0–0.2)

## 2023-03-19 LAB — BASIC METABOLIC PANEL
Anion gap: 13 (ref 5–15)
BUN: 56 mg/dL — ABNORMAL HIGH (ref 6–20)
CO2: 25 mmol/L (ref 22–32)
Calcium: 8.8 mg/dL — ABNORMAL LOW (ref 8.9–10.3)
Chloride: 94 mmol/L — ABNORMAL LOW (ref 98–111)
Creatinine, Ser: 3.13 mg/dL — ABNORMAL HIGH (ref 0.61–1.24)
GFR, Estimated: 23 mL/min — ABNORMAL LOW (ref >60)
Glucose, Bld: 290 mg/dL — ABNORMAL HIGH (ref 70–99)
Potassium: 3.4 mmol/L — ABNORMAL LOW (ref 3.5–5.1)
Sodium: 132 mmol/L — ABNORMAL LOW (ref 135–145)

## 2023-03-19 MED ORDER — INSULIN GLARGINE-YFGN 100 UNIT/ML ~~LOC~~ SOLN
18.0000 [IU] | Freq: Every day | SUBCUTANEOUS | Status: DC
  Administered 2023-03-19 – 2023-03-20 (×2): 18 [IU] via SUBCUTANEOUS
  Filled 2023-03-19 (×2): qty 0.18

## 2023-03-19 MED ORDER — ENOXAPARIN SODIUM 100 MG/ML IJ SOSY
100.0000 mg | PREFILLED_SYRINGE | Freq: Two times a day (BID) | INTRAMUSCULAR | Status: DC
  Administered 2023-03-19 – 2023-03-21 (×5): 100 mg via SUBCUTANEOUS
  Filled 2023-03-19 (×6): qty 1

## 2023-03-19 MED ORDER — POTASSIUM CHLORIDE CRYS ER 20 MEQ PO TBCR
20.0000 meq | EXTENDED_RELEASE_TABLET | Freq: Once | ORAL | Status: AC
  Administered 2023-03-19: 20 meq via ORAL
  Filled 2023-03-19: qty 1

## 2023-03-19 NOTE — Progress Notes (Signed)
ANTICOAGULATION CONSULT NOTE - Initial Consult  Pharmacy Consult for Lovenox (Eliquis on hold) Indication:  LLE DVT of the L peroneal vein  No Known Allergies  Patient Measurements: Height: 5' 7.5" (171.5 cm) Weight: 104.5 kg (230 lb 6.1 oz) IBW/kg (Calculated) : 67.25  Vital Signs: Temp: 98.4 F (36.9 C) (06/16 0809) Temp Source: Oral (06/16 0809) BP: 144/80 (06/16 0809) Pulse Rate: 78 (06/16 0809)  Labs: Recent Labs    03/17/23 0415 03/18/23 0349 03/19/23 0452  HGB 9.0* 10.3* 12.3*  HCT 26.9* 30.3* 36.0*  PLT 262 336 398  CREATININE 5.28* 4.12* 3.13*    Estimated Creatinine Clearance: 33.2 mL/min (A) (by C-G formula based on SCr of 3.13 mg/dL (H)).   Medical History: Past Medical History:  Diagnosis Date   Acute gangrenous appendicitis 09/25/2022   Anxiety    CHF (congestive heart failure) (HCC)    Depression    Diabetes mellitus without complication (HCC)    Hypertension     Medications:  Medications Prior to Admission  Medication Sig Dispense Refill Last Dose   albuterol (VENTOLIN HFA) 108 (90 Base) MCG/ACT inhaler Inhale 2 puffs into the lungs every 6 (six) hours as needed for wheezing or shortness of breath. 8 g 2 Past Month   apixaban (ELIQUIS) 5 MG TABS tablet Take 2 tablets twice daily until 01/27/23, then continue with one tablet twice daily starting on 01/28/23. (Patient taking differently: Take 5 mg by mouth 2 (two) times daily.) 60 tablet 0 03/10/2023 at PM   aspirin EC 81 MG tablet Take 81 mg by mouth daily.   Past Week   atorvastatin (LIPITOR) 80 MG tablet Take 1 tablet (80 mg total) by mouth daily. 30 tablet 0 Past Week   furosemide (LASIX) 20 MG tablet Take 20 mg by mouth daily.   Past Week   hydrALAZINE (APRESOLINE) 50 MG tablet Take 1 tablet (50 mg total) by mouth every 8 (eight) hours. 90 tablet 0 Past Week at pm   hydrOXYzine (VISTARIL) 25 MG capsule Take 25 mg by mouth 3 (three) times daily as needed for anxiety.   Past Week   insulin  glargine (LANTUS) 100 unit/mL SOPN Inject 34 Units into the skin daily. (Patient taking differently: Inject 5 Units into the skin 3 (three) times daily before meals.) 15 mL 1 Past Week   insulin lispro (HUMALOG KWIKPEN) 100 UNIT/ML KwikPen Inject 5 Units into the skin 3 (three) times daily before meals. (Patient taking differently: Inject 34 Units into the skin daily.) 9 mL 11 Past Week at pm   lisinopril (ZESTRIL) 20 MG tablet Take 20 mg by mouth daily.   Past Week   metoprolol succinate (TOPROL-XL) 50 MG 24 hr tablet Take 50 mg by mouth daily. Take with or immediately following a meal.   03/10/2023 at PM   prednisoLONE acetate (PRED FORTE) 1 % ophthalmic suspension Place 1 drop into the left eye daily. Left eye: 1 drop 4 times daily for 7 days, 1 drop 3 times daily for 7 days, 1 drop 2 times daily for 7 days, 1 drop once daily for 7 days, stop.   Past Week   rosuvastatin (CRESTOR) 20 MG tablet Take 20 mg by mouth daily.   Past Week   torsemide (DEMADEX) 20 MG tablet Take 2 tablets (40 mg total) by mouth 2 (two) times daily. 120 tablet 5 Past Week   amLODipine (NORVASC) 5 MG tablet Take 1 tablet (5 mg total) by mouth daily. (Patient not taking: Reported on  03/14/2023) 90 tablet 1 Not Taking   blood glucose meter kit and supplies Dispense based on patient and insurance preference. Use up to four times daily as directed. (FOR ICD-10 E10.9, E11.9). 1 each 0    isosorbide mononitrate (IMDUR) 30 MG 24 hr tablet Take 1 tablet (30 mg total) by mouth daily. (Patient not taking: Reported on 03/14/2023) 30 tablet 0 Not Taking   sulfamethoxazole-trimethoprim (BACTRIM DS) 800-160 MG tablet Take 1 tablet by mouth 2 (two) times daily for 7 days. (Patient not taking: Reported on 03/14/2023) 14 tablet 0 Not Taking    Assessment: 52 y.o. M on Eliquis PTA for LLE DVT of L peroneal vein. Last dose given in hospital 6/16 1050. To transition to full dose Lovenox as may need surgery to drain his abscess. CBC stable.  Noted  SCr improved to 3.13, est CrCl 33 ml/min. UOP 0.8 ml/kg/hr.  Goal of Therapy:  Anti-Xa level 0.6-1 units/ml 4hrs after LMWH dose given Monitor platelets by anticoagulation protocol: Yes   Plan:  D/c apixaban Lovenox 100mg  SQ q12h - first dose at 2300 tonight. CBC q72h while on Lovenox Will follow renal function closely  Christoper Fabian, PharmD, BCPS Please see amion for complete clinical pharmacist phone list 03/19/2023,7:40 PM

## 2023-03-19 NOTE — Progress Notes (Signed)
Patient ID: Leroy Simmons, male   DOB: 04/01/1971, 52 y.o.   MRN: [id number] [facility] Progress Note   Assessment/ Plan:   1. Acute kidney Injury on chronic kidney disease stage IIIa: Nonoliguric overnight with ongoing furosemide use.  Urine studies significant for proteinuria (likely related to his diabetes mellitus) and without any indication for GN-workup so far appears to favor ATN based on urine electrolytes (although clinically he may also have venous congestion in the setting of diastolic heart failure/volume status).  The plan is to continue augmenting urine output with diuretics to help optimize volume status while following labs that are indicative of renal recovery.  Will plan to de-escalate diuretic therapy in the next 24-48 hours. 2.  Sepsis secondary to abscess/cellulitis of mid back: Status post incision and drainage and with improvement on linezolid.  Sepsis markers appear to have resolved. 3.  Hypertension: Blood pressure marginally elevated, monitor with ongoing diuresis. 4.  Hyponatremia: Secondary to acute kidney injury and impaired free water handling/unrestricted intake.  Continue fluid restriction of 1.2 L/day and monitor with diuresis-patient reminded of this. 5.  Hypokalemia: Secondary to diuretic induced losses and will be replaced via oral route.  Subjective:   Reports relief of abdominal pain after multiple bowel movements from laxatives yesterday.  Denies any acute complaints when seen today.   Objective:   BP (!) 144/80 (BP Location: Left Arm)   Pulse 78   Temp 98.4 F (36.9 C) (Oral)   Resp 17   Ht 5' 7.5" (1.715 m)   Wt 104.3 kg   SpO2 98%   BMI 35.49 kg/m   Intake/Output Summary (Last 24 hours) at 03/19/2023 0814 Last data filed at 03/19/2023 0745 Gross per 24 hour  Intake 569 ml  Output 2855 ml  Net -2286 ml   Weight change:   Physical Exam: Gen: Appears to be comfortable sitting up in recliner watching television CVS: Pulse regular  rhythm, normal rate, S1 and S2 normal Resp: Clear to auscultation bilaterally, no rales/rhonchi Abd: Obese, nontender, bowel sounds normal Ext: 2-3+ bilateral pitting edema  Imaging: No results found.  Labs: BMET Recent Labs  Lab 03/13/23 1638 03/13/23 2017 03/14/23 0108 03/14/23 0121 03/15/23 0302 03/16/23 0423 03/17/23 0415 03/18/23 0349 03/19/23 0452  NA 137   < > 134* 139 133* 130* 133* 133* 132*  K 3.7   < > 4.1 4.3 3.8 3.5 3.5 3.7 3.4*  CL 96*  --  101  --  97* 95* 97* 96* 94*  CO2 25  --  22  --  23 25 25 24 25   GLUCOSE 402*  --  289*  --  250* 276* 164* 203* 290*  BUN 21*  --  23*  --  34* 40* 55* 57* 56*  CREATININE 1.60*  --  1.74*  --  2.67* 3.91* 5.28* 4.12* 3.13*  CALCIUM 8.9  --  8.2*  --  9.0 8.2* 8.2* 8.6* 8.8*   < > = values in this interval not displayed.   CBC Recent Labs  Lab 03/16/23 0423 03/17/23 0415 03/18/23 0349 03/19/23 0452  WBC 12.2* 10.6* 10.1 10.5  HGB 10.2* 9.0* 10.3* 12.3*  HCT 30.1* 26.9* 30.3* 36.0*  MCV 88.3 88.5 86.8 86.1  PLT 261 262 336 398    Medications:     amLODipine  5 mg Oral Daily   apixaban  5 mg Oral BID   atorvastatin  80 mg Oral Daily   furosemide  80 mg Intravenous BID  insulin aspart  0-15 Units Subcutaneous TID WC   insulin aspart  0-5 Units Subcutaneous QHS   insulin glargine-yfgn  13 Units Subcutaneous Daily   insulin glargine-yfgn  20 Units Subcutaneous QHS   isosorbide mononitrate  30 mg Oral Daily   linezolid  600 mg Oral Q12H   metoprolol succinate  50 mg Oral Daily   sodium chloride flush  3 mL Intravenous Q12H   Zetta Bills, MD 03/19/2023, 8:14 AM

## 2023-03-19 NOTE — Consult Note (Signed)
Reason for Consult/Chief Complaint: back abscess Consultant: Leodis Binet, MD  Leroy Simmons is an 52 y.o. male.   HPI: 59M with DM, CKD3b, and a one week h/o back abscess s/p I&D in ED on 6/9. No cultures sent. Was discharged on bactrim, but did not take any, re-presented the following day and has been admitted since then and been on zyvoxx. Comparative wound appearance seemed to have worsened erythema, so primary team consulted surgery. Notably, he is on Eliquis for LLE DVT of the L peroneal vein.   Past Medical History:  Diagnosis Date   Acute gangrenous appendicitis 09/25/2022   Anxiety    CHF (congestive heart failure) (HCC)    Depression    Diabetes mellitus without complication (HCC)    Hypertension     Past Surgical History:  Procedure Laterality Date   LAPAROSCOPIC APPENDECTOMY N/A 09/26/2022   Procedure: APPENDECTOMY LAPAROSCOPIC;  Surgeon: Campbell Lerner, MD;  Location: [facility] ORS;  Service: General;  Laterality: N/A;   LEFT HEART CATH AND CORONARY ANGIOGRAPHY N/A 03/21/2022   Procedure: LEFT HEART CATH AND CORONARY ANGIOGRAPHY;  Surgeon: Corky Crafts, MD;  Location: [facility] INVASIVE CV LAB;  Service: Cardiovascular;  Laterality: N/A;    Family History  Problem Relation Age of Onset   Heart failure Mother     Social History:  reports that he has never smoked. He has never used smokeless tobacco. He reports current alcohol use. He reports that he does not use drugs.  Allergies: No Known Allergies  Medications: I have reviewed the patient's current medications.  Results for orders placed or performed during the hospital encounter of 03/13/23 (from the past 48 hour(s))  Glucose, capillary     Status: Abnormal   Collection Time: 03/17/23  9:48 PM  Result Value Ref Range   Glucose-Capillary 231 (H) 70 - 99 mg/dL    Comment: Glucose reference range applies only to samples taken after fasting for at least 8 hours.  Glucose, capillary     Status: Abnormal    Collection Time: 03/18/23 12:11 AM  Result Value Ref Range   Glucose-Capillary 212 (H) 70 - 99 mg/dL    Comment: Glucose reference range applies only to samples taken after fasting for at least 8 hours.   Comment 1 Notify RN   CBC     Status: Abnormal   Collection Time: 03/18/23  3:49 AM  Result Value Ref Range   WBC 10.1 4.0 - 10.5 K/uL   RBC 3.49 (L) 4.22 - 5.81 MIL/uL   Hemoglobin 10.3 (L) 13.0 - 17.0 g/dL   HCT 40.9 (L) 81.1 - 91.4 %   MCV 86.8 80.0 - 100.0 fL   MCH 29.5 26.0 - 34.0 pg   MCHC 34.0 30.0 - 36.0 g/dL   RDW 78.2 95.6 - 21.3 %   Platelets 336 150 - 400 K/uL   nRBC 0.0 0.0 - 0.2 %    Comment: Performed at [facility], [address], [place], [place] [address]  Basic metabolic panel     Status: Abnormal   Collection Time: 03/18/23  3:49 AM  Result Value Ref Range   Sodium 133 (L) 135 - 145 mmol/L   Potassium 3.7 3.5 - 5.1 mmol/L   Chloride 96 (L) 98 - 111 mmol/L   CO2 24 22 - 32 mmol/L   Glucose, Bld 203 (H) 70 - 99 mg/dL    Comment: Glucose reference range applies only to samples taken after fasting for at least  8 hours.   BUN 57 (H) 6 - 20 mg/dL   Creatinine, Ser 1.61 (H) 0.61 - 1.24 mg/dL   Calcium 8.6 (L) 8.9 - 10.3 mg/dL   GFR, Estimated 17 (L) >60 mL/min    Comment: (NOTE) Calculated using the CKD-EPI Creatinine Equation (2021)    Anion gap 13 5 - 15    Comment: Performed at [facility], [address] [place], [place] [address]  Iron and TIBC     Status: Abnormal   Collection Time: 03/18/23  3:49 AM  Result Value Ref Range   Iron 46 45 - 182 ug/dL   TIBC 540 (L) 981 - 191 ug/dL   Saturation Ratios 19 17.9 - 39.5 %   UIBC 200 ug/dL    Comment: Performed at [facility], [address] [place], [place] [address]  Ferritin     Status: None   Collection Time: 03/18/23  3:49 AM  Result Value Ref Range   Ferritin 220 24 - 336 ng/mL    Comment: Performed at [facility], [address] [place], [place] [address]   Reticulocytes     Status: Abnormal   Collection Time: 03/18/23  3:49 AM  Result Value Ref Range   Retic Ct Pct 1.6 0.4 - 3.1 %   RBC. 3.40 (L) 4.22 - 5.81 MIL/uL   Retic Count, Absolute 52.7 19.0 - 186.0 K/uL   Immature Retic Fract 20.5 (H) 2.3 - 15.9 %    Comment: Performed at [facility], [address] [place], [place] [address]  Glucose, capillary     Status: Abnormal   Collection Time: 03/18/23  4:48 AM  Result Value Ref Range   Glucose-Capillary 200 (H) 70 - 99 mg/dL    Comment: Glucose reference range applies only to samples taken after fasting for at least 8 hours.   Comment 1 Notify RN   Glucose, capillary     Status: Abnormal   Collection Time: 03/18/23 10:04 AM  Result Value Ref Range   Glucose-Capillary 226 (H) 70 - 99 mg/dL    Comment: Glucose reference range applies only to samples taken after fasting for at least 8 hours.   Comment 1 Notify RN    Comment 2 Document in Chart   Glucose, capillary     Status: Abnormal   Collection Time: 03/18/23 11:48 AM  Result Value Ref Range   Glucose-Capillary 197 (H) 70 - 99 mg/dL    Comment: Glucose reference range applies only to samples taken after fasting for at least 8 hours.  Glucose, capillary     Status: Abnormal   Collection Time: 03/18/23  4:32 PM  Result Value Ref Range   Glucose-Capillary 233 (H) 70 - 99 mg/dL    Comment: Glucose reference range applies only to samples taken after fasting for at least 8 hours.  CBC     Status: Abnormal   Collection Time: 03/19/23  4:52 AM  Result Value Ref Range   WBC 10.5 4.0 - 10.5 K/uL   RBC 4.18 (L) 4.22 - 5.81 MIL/uL   Hemoglobin 12.3 (L) 13.0 - 17.0 g/dL   HCT 84.6 (L) 96.2 - 95.2 %   MCV 86.1 80.0 - 100.0 fL   MCH 29.4 26.0 - 34.0 pg   MCHC 34.2 30.0 - 36.0 g/dL   RDW 84.1 32.4 - 40.1 %   Platelets 398 150 - 400 K/uL   nRBC 0.0 0.0 - 0.2 %    Comment:  Performed at [facility], [address], [place], [place] [address]  Basic metabolic panel     Status:  Abnormal   Collection Time: 03/19/23  4:52 AM  Result Value Ref Range   Sodium 132 (L) 135 - 145 mmol/L   Potassium 3.4 (L) 3.5 - 5.1 mmol/L   Chloride 94 (L) 98 - 111 mmol/L   CO2 25 22 - 32 mmol/L   Glucose, Bld 290 (H) 70 - 99 mg/dL    Comment: Glucose reference range applies only to samples taken after fasting for at least 8 hours.   BUN 56 (H) 6 - 20 mg/dL   Creatinine, Ser 6.04 (H) 0.61 - 1.24 mg/dL   Calcium 8.8 (L) 8.9 - 10.3 mg/dL   GFR, Estimated 23 (L) >60 mL/min    Comment: (NOTE) Calculated using the CKD-EPI Creatinine Equation (2021)    Anion gap 13 5 - 15    Comment: Performed at [facility], [address] [place], [place] [address]  Glucose, capillary     Status: Abnormal   Collection Time: 03/19/23  8:26 AM  Result Value Ref Range   Glucose-Capillary 305 (H) 70 - 99 mg/dL    Comment: Glucose reference range applies only to samples taken after fasting for at least 8 hours.  Glucose, capillary     Status: Abnormal   Collection Time: 03/19/23 12:30 PM  Result Value Ref Range   Glucose-Capillary 257 (H) 70 - 99 mg/dL    Comment: Glucose reference range applies only to samples taken after fasting for at least 8 hours.  Glucose, capillary     Status: Abnormal   Collection Time: 03/19/23  4:59 PM  Result Value Ref Range   Glucose-Capillary 200 (H) 70 - 99 mg/dL    Comment: Glucose reference range applies only to samples taken after fasting for at least 8 hours.    No results found.  ROS 10 point review of systems is negative except as listed above in HPI.   Physical Exam Blood pressure (!) 144/80, pulse 78, temperature 98.4 F (36.9 C), temperature source Oral, resp. rate 17, height 5' 7.5" (1.715 m), weight 104.5 kg, SpO2 98 %. Constitutional: well-developed, well-nourished HEENT: pupils equal, round, reactive to light, 2mm b/l, moist conjunctiva, external inspection of ears and nose normal, hearing intact Oropharynx: normal oropharyngeal mucosa,  normal dentition Neck: no thyromegaly, trachea midline  Chest: breath sounds equal bilaterally, normal respiratory effort, no midline or lateral chest wall tenderness to palpation/deformity Abdomen: soft, NT, no bruising, no hepatosplenomegaly  Back: mid-back wound with crusting and erythema, mildly tender, no fluctuance or expressible drainage Rectal: deferred Extremities: 2+ radial and pedal pulses bilaterally, intact motor and sensation bilateral UE and LE, no peripheral edema MSK: normal gait/station, no clubbing/cyanosis of fingers/toes, normal ROM of all four extremities Skin: warm, dry, no rashes Psych: normal memory, normal mood/affect     Assessment/Plan: 30M with back abscess s/p I&D, now with worsening erythema despite abx. No expressible drainage, no fluctuance. Given h/o DM, will obtain comparative CT for deeper fluid collection, noncotrast in the setting of CKD3b. Will f/u after imaging complete. Recommend transition to therapeutic lovenox instead of Eliquis in case of surgical indication.    Diamantina Monks, MD General and Trauma Surgery [facility]

## 2023-03-19 NOTE — Progress Notes (Signed)
PROGRESS NOTE   Leroy Simmons  ZOX:[id number] DOB: 1970/11/19 DOA: 03/13/2023 PCP: de [place], Raymond J, MD   Date of Service: the patient was seen and examined on 03/19/2023  Brief Narrative:   Leroy Simmons is a 52 y.o. male with medical history significant for HFpEF (EF 60 to 65%), nonobstructive CAD, LLE DVT on Eliquis, insulin-dependent T2DM, HTN, CKD stage IIIa, asthma, HLD, depression/anxiety who presented to the ED for evaluation of left-sided chest and mid back pain. Patient was seen in the ED early morning of 6/9 for evaluation of a small abscess to his mid back.  I&D was performed in the ED with scant bloody drainage. T-spine x-ray was negative.  Patient was discharged with prescription for Bactrim. Unfortunately patient has not been able to obtain the antibiotic. Patient was hospitalized and placed on IV antibiotics.    Assessment and Plan:   Cellulitis/superficial abscess of mid back, treating  WBC 10 today. Afebrile. Day 5/7 of Linezolid.  No cultures available from previous I&D on 6/9. So far has tried bactrim, doxycyline, CTX prior to Linezolid and abscess is worsening. On exam abscess is worsening with surrounding fluctuance and erythema. Pt however does not feel worsening pain which is reassuring. See picture below. -ID consulted-likely staph aureus. Recommended repeat I&D and then 1 week of additional linezolid  -Gen surg consulted for I&D -Continue wound care per RN  -Continue Linezolid as per ID above  -Monitor with CBC  AKI on chronic kidney disease stage IIIa, treating  Improving. Creatinine 3.13, GFR 123 this morning, Baseline appears to be 1.7-2. Nephro following: No acute indications for dialysis, suspect it is AKI may be due to combination of ATN associated with sepsis and hemodynamically mediated from exacerbation of diastolic heart failure.  They started IV furosemide 80 mg twice daily which seems to be improving urine output and volume status. Cumulative UOP 6.8L,  975cc today -Nephrology following appreciate recs  -Monitor Is and Os -Daily weights  -Continue to monitor with BMP -Hold nephrotoxic agents-NSAIDs, lisinopril etc  -Continue diuresis with lasix 80mg  IV BID per nephro  Hypokalemia K 3.4, repleted with of Kcl -Continue to monitor with BMP and replete   Uncontrolled Diabetes mellitus type 2, treating  CBGs overnight still elevated to 257-305.  Last A1c 11 in April -Monitor CBGs -Continue to hold metformin -Continue insulin sliding scale and meal time coverage 0-5 units -Increase Semglee to 18 units daily -Semglee 20 units at bedtime  Left-sided chest flank and back pain, resolved  -Continue with pain medications -Explained to patient that if chest pain changes in character and intensity then he should let RN know   Chronic diastolic CHF, stable   -Continue metoprolol -Diuresis as above  Normocytic anemia, stable Hb 12, Baseline is 9-11.  Likely has anemia of chronic disease.  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 -Continue Eliquis.   Essential hypertension, stable BP 144/80 -Continue home medications including metoprolol Imdur and amlodipine.   Left lower lobe pulmonary nodule, stable 9 mm nodule identified.  Seems to be stable compared to 1 year ago.  Follows with Dr. Delton Coombes.   Coronary artery disease, stable Nonobstructive CAD by left heart catheterization in June 2023.  Minimal elevation in troponin is likely of no clinical significance.  Chest discomfort is likely musculoskeletal.  EKG without any new ischemic changes.   -Continue medical management for now.  History of anxiety and depression, stable -Continue BuSpar.   Hyperlipidemia, stable -Continue statin   History of asthma, stable   Subjective:   Doing well. No acute concerns.   Physical Exam:  Vitals:   03/18/23 1534 03/18/23 1953 03/19/23  0037 03/19/23 0809  BP: 139/84 128/81 (!) 155/92 (!) 144/80  Pulse: 71 76 80 78  Resp: 17 18 18 17   Temp: 98.1 F (36.7 C) 98.2 F (36.8 C) 98.9 F (37.2 C) 98.4 F (36.9 C)  TempSrc: Oral   Oral  SpO2: 97% 99% 100% 98%  Weight:      Height:       General: Alert, no acute distress, pleasant, well appearing  Cardio: well perfused  Pulm: normal work of breathing Extremities: peripheral edema improving  Neuro: Cranial nerves grossly intact  MSK: abscess over mid thoracic spine circumscribed with area of erythema increasing daily compared to previous exams, mild TTP, fluctuance present     Data Reviewed:  I have personally reviewed and interpreted labs, imaging.  CBC: Recent Labs  Lab 03/15/23 0302 03/16/23 0423 03/17/23 0415 03/18/23 0349 03/19/23 0452  WBC 16.6* 12.2* 10.6* 10.1 10.5  HGB 11.8* 10.2* 9.0* 10.3* 12.3*  HCT 35.2* 30.1* 26.9* 30.3* 36.0*  MCV 88.7 88.3 88.5 86.8 86.1  PLT 253 261 262 336 398    Basic Metabolic Panel: Recent Labs  Lab 03/15/23 0302 03/16/23 0423 03/17/23 0415 03/18/23 0349 03/19/23 0452  NA 133* 130* 133* 133* 132*  K 3.8 3.5 3.5 3.7 3.4*  CL 97* 95* 97* 96* 94*  CO2 23 25 25 24 25   GLUCOSE 250* 276* 164* 203* 290*  BUN 34* 40* 55* 57* 56*  CREATININE 2.67* 3.91* 5.28* 4.12* 3.13*  CALCIUM 9.0 8.2* 8.2* 8.6* 8.8*    GFR: Estimated Creatinine Clearance: 33.2 mL/min (A) (by C-G formula based on SCr of 3.13 mg/dL (H)). Liver Function Tests: No results for input(s): "AST", "ALT", "ALKPHOS", "BILITOT", "PROT", "ALBUMIN" in the last 168 hours.  Coagulation Profile: No results for input(s): "INR", "PROTIME" in the last 168 hours.  Code Status:  Full code.  Code status decision has been confirmed with: patient  Family Communication:     Severity of Illness:  The appropriate patient status for this patient is INPATIENT. Inpatient status is judged to be reasonable and necessary in order to provide the required intensity of  service to ensure the patient's safety. The patient's presenting symptoms, physical exam findings, and initial radiographic and laboratory data in the context of their chronic comorbidities is felt to place them at high risk for further clinical deterioration. Furthermore, it is not anticipated that the patient will be medically stable for discharge from the hospital within 2 midnights of admission.   * I certify that at the point of admission it is my clinical judgment that the patient will require inpatient hospital care spanning beyond 2 midnights from the point of admission due to high intensity of service, high risk for further deterioration and high frequency of surveillance required.*   Author:  Rolm Gala MD  03/19/2023 9:31 AM

## 2023-03-19 NOTE — Consult Note (Signed)
[facility]    Date of Admission:  03/13/2023     Reason for Consult: cellulitis/abscess    Referring Provider: Allena Katz       Abx: 6/11-c linezolid        Assessment: 52 yo male with HFpEF, cad, lle dvt on eliquis, dm2, ckd3, admitted 6/10 with left chest pain and mid back pain in setting of cellulitis/abscess  Patient lanced in ed but now the lancing site closed and the abscess is redeveloping  This is not abx failure   No cx available. But suspect staphylococcal aureus process  Plan: Would recommend another evaluation for lancing Once he is lanced well and the abscess cavity is cleared, finish another week linezolid Discussed with primary team      ------------------------------------------------ Principal Problem:   Sepsis due to cellulitis [facility]) Active Problems:   Hypertension associated with diabetes (HCC)   Asthma   Hyperlipidemia associated with type 2 diabetes mellitus (HCC)   Abscess of back, except buttock   Anxiety and depression   Coronary artery disease involving native coronary artery of native heart without angina pectoris   Left lower lobe pulmonary nodule   Uncontrolled type 2 diabetes mellitus with hyperglycemia, with long-term current use of insulin (HCC)   Heart failure with preserved ejection fraction (HCC)   Deep venous thrombosis (DVT) of left peroneal vein (HCC)   Chronic kidney disease, stage 3a (HCC)   AKI (acute kidney injury) (HCC)    HPI: Leroy Simmons is a 52 y.o. male with HFpEF, cad, lle dvt on eliquis, dm2, ckd3, admitted 6/10 with left chest pain and mid back pain in setting of cellulitis/abscess  Patient lanced in ed but now the lancing site closed and the abscess is redeveloping  Patient lanced in ed and on linezolid since admission  No cx available Fever initially but resolved Leukocytosis also resolved  ID called today as erythema had gotten worse along with  swelling       Family History  Problem Relation Age of Onset   Heart failure Mother     Social History   Tobacco Use   Smoking status: Never   Smokeless tobacco: Never  Vaping Use   Vaping Use: Never used  Substance Use Topics   Alcohol use: Yes    Comment: rarely   Drug use: Never    No Known Allergies  Review of Systems: ROS All Other ROS was negative, except mentioned above   Past Medical History:  Diagnosis Date   Acute gangrenous appendicitis 09/25/2022   Anxiety    CHF (congestive heart failure) (HCC)    Depression    Diabetes mellitus without complication (HCC)    Hypertension        Scheduled Meds:  amLODipine  5 mg Oral Daily   apixaban  5 mg Oral BID   atorvastatin  80 mg Oral Daily   furosemide  80 mg Intravenous BID   insulin aspart  0-15 Units Subcutaneous TID WC   insulin aspart  0-5 Units Subcutaneous QHS   insulin glargine-yfgn  18 Units Subcutaneous Daily   insulin glargine-yfgn  20 Units Subcutaneous QHS   isosorbide mononitrate  30 mg Oral Daily   linezolid  600 mg Oral Q12H   metoprolol succinate  50 mg Oral Daily   sodium chloride flush  3 mL Intravenous Q12H   Continuous Infusions: PRN Meds:.acetaminophen **OR** acetaminophen, albuterol, busPIRone, ondansetron **OR** ondansetron (ZOFRAN) IV, oxyCODONE, senna-docusate  OBJECTIVE: Blood pressure (!) 144/80, pulse 78, temperature 98.4 F (36.9 C), temperature source Oral, resp. rate 17, height 5' 7.5" (1.715 m), weight 104.3 kg, SpO2 98 %.  Physical Exam  General/constitutional: no distress, pleasant HEENT: Normocephalic, PER, Conj Clear, EOMI, Oropharynx clear Neck supple CV: rrr no mrg Lungs: clear to auscultation, normal respiratory effort Abd: Soft, Nontender Ext: no edema Skin: back abscess/carbuncle with central eschar; warm/tender to touch Neuro: nonfocal MSK: no peripheral joint swelling/tenderness/warmth; back spines nontender   Lab Results Lab Results   Component Value Date   WBC 10.5 03/19/2023   HGB 12.3 (L) 03/19/2023   HCT 36.0 (L) 03/19/2023   MCV 86.1 03/19/2023   PLT 398 03/19/2023    Lab Results  Component Value Date   CREATININE 3.13 (H) 03/19/2023   BUN 56 (H) 03/19/2023   NA 132 (L) 03/19/2023   K 3.4 (L) 03/19/2023   CL 94 (L) 03/19/2023   CO2 25 03/19/2023    Lab Results  Component Value Date   ALT 35 01/19/2023   AST 24 01/19/2023   ALKPHOS 128 (H) 01/19/2023   BILITOT 0.8 01/19/2023      Microbiology: No results found for this or any previous visit (from the past 240 hour(s)).   Serology:    Imaging: If present, new imagings (plain films, ct scans, and mri) have been personally visualized and interpreted; radiology reports have been reviewed. Decision making incorporated into the Impression / Recommendations.  6/10 chest ct 1. Nonspecific subcutaneous fat stranding in the midline back at the level of T9-T10 and extending inferiorly. Correlate for cellulitis or contusion. 2. 9 mm left lower lobe pulmonary nodule, stable since 03/18/2022. Follow-up in 6-12 months (from today's scan) is considered optional for low risk patients, but is recommended for high-risk patients. This recommendation follows the consensus statement: Guidelines for Management of Incidental Pulmonary Nodules Detected on CT Images  Raymondo Band, MD [facility] [phone] pager    03/19/2023, 2:33 PM

## 2023-03-20 DIAGNOSIS — G061 Intraspinal abscess and granuloma: Secondary | ICD-10-CM | POA: Diagnosis not present

## 2023-03-20 DIAGNOSIS — A419 Sepsis, unspecified organism: Secondary | ICD-10-CM | POA: Diagnosis not present

## 2023-03-20 DIAGNOSIS — L03312 Cellulitis of back [any part except buttock]: Secondary | ICD-10-CM | POA: Diagnosis not present

## 2023-03-20 DIAGNOSIS — N179 Acute kidney failure, unspecified: Secondary | ICD-10-CM | POA: Diagnosis not present

## 2023-03-20 LAB — CBC
HCT: 38 % — ABNORMAL LOW (ref 39.0–52.0)
Hemoglobin: 12.6 g/dL — ABNORMAL LOW (ref 13.0–17.0)
MCH: 29.2 pg (ref 26.0–34.0)
MCHC: 33.2 g/dL (ref 30.0–36.0)
MCV: 88.2 fL (ref 80.0–100.0)
Platelets: 385 10*3/uL (ref 150–400)
RBC: 4.31 MIL/uL (ref 4.22–5.81)
RDW: 12 % (ref 11.5–15.5)
WBC: 11.5 10*3/uL — ABNORMAL HIGH (ref 4.0–10.5)
nRBC: 0 % (ref 0.0–0.2)

## 2023-03-20 LAB — GLUCOSE, CAPILLARY
Glucose-Capillary: 288 mg/dL — ABNORMAL HIGH (ref 70–99)
Glucose-Capillary: 235 mg/dL — ABNORMAL HIGH (ref 70–99)
Glucose-Capillary: 244 mg/dL — ABNORMAL HIGH (ref 70–99)
Glucose-Capillary: 330 mg/dL — ABNORMAL HIGH (ref 70–99)
Glucose-Capillary: 264 mg/dL — ABNORMAL HIGH (ref 70–99)

## 2023-03-20 LAB — COMPREHENSIVE METABOLIC PANEL
ALT: 24 U/L (ref 0–44)
AST: 17 U/L (ref 15–41)
Albumin: 2.6 g/dL — ABNORMAL LOW (ref 3.5–5.0)
Alkaline Phosphatase: 157 U/L — ABNORMAL HIGH (ref 38–126)
Anion gap: 15 (ref 5–15)
BUN: 50 mg/dL — ABNORMAL HIGH (ref 6–20)
CO2: 28 mmol/L (ref 22–32)
Calcium: 9 mg/dL (ref 8.9–10.3)
Chloride: 96 mmol/L — ABNORMAL LOW (ref 98–111)
Creatinine, Ser: 2.64 mg/dL — ABNORMAL HIGH (ref 0.61–1.24)
GFR, Estimated: 29 mL/min — ABNORMAL LOW (ref >60)
Glucose, Bld: 263 mg/dL — ABNORMAL HIGH (ref 70–99)
Potassium: 3.6 mmol/L (ref 3.5–5.1)
Sodium: 137 mmol/L (ref 135–145)
Total Bilirubin: 0.5 mg/dL (ref 0.3–1.2)
Total Protein: 7.3 g/dL (ref 6.5–8.1)

## 2023-03-20 MED ORDER — INSULIN ASPART 100 UNIT/ML IJ SOLN
4.0000 [IU] | Freq: Three times a day (TID) | INTRAMUSCULAR | Status: DC
  Administered 2023-03-20 – 2023-03-22 (×5): 4 [IU] via SUBCUTANEOUS

## 2023-03-20 MED ORDER — INSULIN GLARGINE-YFGN 100 UNIT/ML ~~LOC~~ SOLN
22.0000 [IU] | Freq: Every day | SUBCUTANEOUS | Status: DC
  Administered 2023-03-20 – 2023-03-21 (×2): 22 [IU] via SUBCUTANEOUS
  Filled 2023-03-20 (×3): qty 0.22

## 2023-03-20 MED ORDER — INSULIN GLARGINE-YFGN 100 UNIT/ML ~~LOC~~ SOLN
22.0000 [IU] | Freq: Every day | SUBCUTANEOUS | Status: DC
  Administered 2023-03-21 – 2023-03-22 (×2): 22 [IU] via SUBCUTANEOUS
  Filled 2023-03-20 (×2): qty 0.22

## 2023-03-20 MED ORDER — POTASSIUM CHLORIDE CRYS ER 20 MEQ PO TBCR
40.0000 meq | EXTENDED_RELEASE_TABLET | Freq: Once | ORAL | Status: AC
  Administered 2023-03-20: 40 meq via ORAL
  Filled 2023-03-20: qty 2

## 2023-03-20 MED ORDER — INSULIN ASPART 100 UNIT/ML IJ SOLN
0.0000 [IU] | Freq: Three times a day (TID) | INTRAMUSCULAR | Status: DC
Start: 2023-03-20 — End: 2023-03-20

## 2023-03-20 MED ORDER — FUROSEMIDE 10 MG/ML IJ SOLN
INTRAMUSCULAR | Status: AC
  Administered 2023-03-20: 80 mg via INTRAVENOUS
  Filled 2023-03-20: qty 4

## 2023-03-20 MED ORDER — TORSEMIDE 20 MG PO TABS
40.0000 mg | ORAL_TABLET | Freq: Two times a day (BID) | ORAL | Status: DC
  Administered 2023-03-21 – 2023-03-22 (×3): 40 mg via ORAL
  Filled 2023-03-20 (×3): qty 2

## 2023-03-20 MED ORDER — FUROSEMIDE 10 MG/ML IJ SOLN
INTRAMUSCULAR | Status: AC
  Filled 2023-03-20: qty 4

## 2023-03-20 MED ORDER — POLYETHYLENE GLYCOL 3350 17 G PO PACK
17.0000 g | PACK | Freq: Two times a day (BID) | ORAL | Status: DC
  Administered 2023-03-20 – 2023-03-22 (×5): 17 g via ORAL
  Filled 2023-03-20 (×5): qty 1

## 2023-03-20 NOTE — Progress Notes (Signed)
Patient ID: Leroy Simmons, male   DOB: [birth year], 52 y.o.   MRN: [id number] [facility] Progress Note   Assessment/ Plan:   1. Acute kidney Injury on chronic kidney disease stage IIIa: Nonoliguric. Urine studies significant for proteinuria (likely related to his diabetes mellitus) and without any indication for GN-workup so far. Appears to favor ATN based on urine electrolytes (although clinically he may also have venous congestion in the setting of diastolic heart failure/volume status).   - Renal function is improving  - Await AM labs - will follow-up  - Will plan to transition to PO diuretics tomorrow - he confirms his home regimen has been torsemide 40 mg BID    2.  Sepsis secondary to abscess/cellulitis of mid back: Status post incision and drainage and with improvement on linezolid.  Note that surgery and ID were consulted.   3.  Hypertension: optimize volume status as above  4.  Hyponatremia: Secondary to acute kidney injury and impaired free water handling/unrestricted intake.  Continue fluid restriction of 1.2 L/day   5. CKD stage 3a - baseline Cr appears 1.5 - 1.8  5.  Hypokalemia: Secondary to diuretic induced losses; replete orally PRN.   Disposition - await AM labs.  Resolving AKI and transitioning to PO diuretics   Subjective:   He had 3.7 liters UOP over 6/16.  He has been on lasix 80 mg IV BID.  He states that he has been on torsemide 40 mg BID for the past month.    Review of systems:  States urinating without difficulty  Denies shortness of breath or chest pain Has had some nausea without vomiting  Ambulating in room per patient    Objective:   BP (!) 146/94 (BP Location: Left Arm)   Pulse 84   Temp 98.4 F (36.9 C)   Resp 18   Ht 5' 7.5" (1.715 m)   Wt 104.5 kg   SpO2 100%   BMI 35.55 kg/m   Intake/Output Summary (Last 24 hours) at 03/20/2023 4098 Last data filed at 03/20/2023 0126 Gross per 24 hour  Intake 634 ml  Output 3725 ml  Net  -3091 ml   Weight change:   Physical Exam:   General adult male in bed in no acute distress HEENT normocephalic atraumatic extraocular movements intact sclera anicteric Neck supple trachea midline Lungs clear to auscultation bilaterally normal work of breathing at rest  Heart regular rate and rhythm no rubs or gallops appreciated Abdomen soft nontender nondistended Extremities 1-2+ edema  Psych normal mood and affect Neuro - resting in bed with the TV on. answers questions readily however gives "1985" then "2025" then 2024 as the year.  Oriented to person and situation   Imaging: CT CHEST WO CONTRAST  Result Date: 03/19/2023 CLINICAL DATA:  Follow-up back wound, initial encounter EXAM: CT CHEST WITHOUT CONTRAST TECHNIQUE: Multidetector CT imaging of the chest was performed following the standard protocol without IV contrast. RADIATION DOSE REDUCTION: This exam was performed according to the departmental dose-optimization program which includes automated exposure control, adjustment of the mA and/or kV according to patient size and/or use of iterative reconstruction technique. COMPARISON:  03/13/2023 FINDINGS: Cardiovascular: Somewhat limited due to lack of IV contrast. No aneurysmal dilatation of the aorta is noted. Coronary calcifications are seen. Heart is mildly enlarged in size. Mediastinum/Nodes: Thoracic inlet is within normal limits. No hilar or mediastinal adenopathy is noted. The esophagus as visualized is within normal limits. Lungs/Pleura: Lungs are well aerated bilaterally. No focal  infiltrate or effusion is seen. No parenchymal nodules are noted. Upper Abdomen: Visualized upper abdomen is within normal limits. Musculoskeletal: Persistent subcutaneous edema and skin thickening is noted in the midportion of the back again in the region of T9-T10. No definitive abscess is seen. No new focal abnormality is noted. IMPRESSION: Stable changes in the midline of the lower chest wall again  likely related to cellulitis. No discrete abscess is noted. No other focal abnormality is noted. Electronically Signed   By: Alcide Clever M.D.   On: 03/19/2023 21:13    Labs: BMET Recent Labs  Lab 03/13/23 1638 03/13/23 2017 03/14/23 0108 03/14/23 0121 03/15/23 0302 03/16/23 0423 03/17/23 0415 03/18/23 0349 03/19/23 0452  NA 137   < > 134* 139 133* 130* 133* 133* 132*  K 3.7   < > 4.1 4.3 3.8 3.5 3.5 3.7 3.4*  CL 96*  --  101  --  97* 95* 97* 96* 94*  CO2 25  --  22  --  23 25 25 24 25   GLUCOSE 402*  --  289*  --  250* 276* 164* 203* 290*  BUN 21*  --  23*  --  34* 40* 55* 57* 56*  CREATININE 1.60*  --  1.74*  --  2.67* 3.91* 5.28* 4.12* 3.13*  CALCIUM 8.9  --  8.2*  --  9.0 8.2* 8.2* 8.6* 8.8*   < > = values in this interval not displayed.   CBC Recent Labs  Lab 03/16/23 0423 03/17/23 0415 03/18/23 0349 03/19/23 0452  WBC 12.2* 10.6* 10.1 10.5  HGB 10.2* 9.0* 10.3* 12.3*  HCT 30.1* 26.9* 30.3* 36.0*  MCV 88.3 88.5 86.8 86.1  PLT 261 262 336 398    Medications:     amLODipine  5 mg Oral Daily   atorvastatin  80 mg Oral Daily   enoxaparin (LOVENOX) injection  100 mg Subcutaneous Q12H   furosemide  80 mg Intravenous BID   insulin aspart  0-15 Units Subcutaneous TID WC   insulin aspart  0-5 Units Subcutaneous QHS   insulin glargine-yfgn  18 Units Subcutaneous Daily   insulin glargine-yfgn  20 Units Subcutaneous QHS   isosorbide mononitrate  30 mg Oral Daily   linezolid  600 mg Oral Q12H   metoprolol succinate  50 mg Oral Daily   sodium chloride flush  3 mL Intravenous Q12H   Estanislado Emms, MD 03/20/2023,7:14 AM

## 2023-03-20 NOTE — Progress Notes (Signed)
Subjective: Still with some pain in his back.  ROS: See above, otherwise other systems negative  Objective: Vital signs in last 24 hours: Temp:  [98.4 F (36.9 C)-98.6 F (37 C)] 98.4 F (36.9 C) (06/17 0521) Pulse Rate:  [82-84] 84 (06/17 0521) Resp:  [18] 18 (06/17 0521) BP: (143-149)/(81-95) 146/94 (06/17 0521) SpO2:  [98 %-100 %] 100 % (06/17 0521) Last BM Date : 03/18/23  Intake/Output from previous day: 06/16 0701 - 06/17 0700 In: 634 [P.O.:634] Out: 3725 [Urine:3725] Intake/Output this shift: No intake/output data recorded.  PE: Gen: NAD, sitting up in chair Skin: back with some erythema, but no purulent drainage able to be expressed, no fluctuance   Lab Results:  Recent Labs    03/19/23 0452 03/20/23 0716  WBC 10.5 11.5*  HGB 12.3* 12.6*  HCT 36.0* 38.0*  PLT 398 385   BMET Recent Labs    03/19/23 0452 03/20/23 0716  NA 132* 137  K 3.4* 3.6  CL 94* 96*  CO2 25 28  GLUCOSE 290* 263*  BUN 56* 50*  CREATININE 3.13* 2.64*  CALCIUM 8.8* 9.0   PT/INR No results for input(s): "LABPROT", "INR" in the last 72 hours. CMP     Component Value Date/Time   NA 137 03/20/2023 0716   NA 139 03/29/2022 1059   K 3.6 03/20/2023 0716   CL 96 (L) 03/20/2023 0716   CO2 28 03/20/2023 0716   GLUCOSE 263 (H) 03/20/2023 0716   BUN 50 (H) 03/20/2023 0716   BUN 29 (H) 03/29/2022 1059   CREATININE 2.64 (H) 03/20/2023 0716   CALCIUM 9.0 03/20/2023 0716   PROT 7.3 03/20/2023 0716   ALBUMIN 2.6 (L) 03/20/2023 0716   AST 17 03/20/2023 0716   ALT 24 03/20/2023 0716   ALKPHOS 157 (H) 03/20/2023 0716   BILITOT 0.5 03/20/2023 0716   GFRNONAA 29 (L) 03/20/2023 0716   GFRAA  10/26/2010 1143    >60        The eGFR has been calculated using the MDRD equation. This calculation has not been validated in all clinical situations. eGFR's persistently <60 mL/min signify possible Chronic Kidney Disease.   Lipase     Component Value Date/Time   LIPASE 39  09/25/2022 1439       Studies/Results: CT CHEST WO CONTRAST  Result Date: 03/19/2023 CLINICAL DATA:  Follow-up back wound, initial encounter EXAM: CT CHEST WITHOUT CONTRAST TECHNIQUE: Multidetector CT imaging of the chest was performed following the standard protocol without IV contrast. RADIATION DOSE REDUCTION: This exam was performed according to the departmental dose-optimization program which includes automated exposure control, adjustment of the mA and/or kV according to patient size and/or use of iterative reconstruction technique. COMPARISON:  03/13/2023 FINDINGS: Cardiovascular: Somewhat limited due to lack of IV contrast. No aneurysmal dilatation of the aorta is noted. Coronary calcifications are seen. Heart is mildly enlarged in size. Mediastinum/Nodes: Thoracic inlet is within normal limits. No hilar or mediastinal adenopathy is noted. The esophagus as visualized is within normal limits. Lungs/Pleura: Lungs are well aerated bilaterally. No focal infiltrate or effusion is seen. No parenchymal nodules are noted. Upper Abdomen: Visualized upper abdomen is within normal limits. Musculoskeletal: Persistent subcutaneous edema and skin thickening is noted in the midportion of the back again in the region of T9-T10. No definitive abscess is seen. No new focal abnormality is noted. IMPRESSION: Stable changes in the midline of the lower chest wall again likely related to cellulitis. No discrete abscess is  noted. No other focal abnormality is noted. Electronically Signed   By: Alcide Clever M.D.   On: 03/19/2023 21:13    Anti-infectives: Anti-infectives (From admission, onward)    Start     Dose/Rate Route Frequency Ordered Stop   03/14/23 1000  linezolid (ZYVOX) tablet 600 mg        600 mg Oral Every 12 hours 03/14/23 0854 03/21/23 0959   03/14/23 0030  cefTRIAXone (ROCEPHIN) 2 g in sodium chloride 0.9 % 100 mL IVPB  Status:  Discontinued        2 g 200 mL/hr over 30 Minutes Intravenous Every 24  hours 03/14/23 0010 03/14/23 0854   03/13/23 2330  cefTRIAXone (ROCEPHIN) 1 g in sodium chloride 0.9 % 100 mL IVPB  Status:  Discontinued        1 g 200 mL/hr over 30 Minutes Intravenous  Once 03/13/23 2324 03/14/23 0015   03/13/23 1700  doxycycline (VIBRA-TABS) tablet 100 mg        100 mg Oral  Once 03/13/23 1655 03/13/23 1719        Assessment/Plan Back abscess, s/p I&D -CT scan reviewed with no abscess noted -no purulent drainage identified -suspect this will continue to improve with IV abx therapy.  If not, may need a wider excision of this area to help this improve. -will monitor   FEN - renal diet VTE - Lovenox ID - zyvox  CKD CHF DM HTN  I reviewed Consultant renal notes, hospitalist notes, last 24 h vitals and pain scores, last 48 h intake and output, last 24 h labs and trends, and last 24 h imaging results.   LOS: 7 days    Letha Cape , [facility] 03/20/2023, 9:07 AM Please see Amion for pager number during day hours 7:00am-4:30pm or 7:00am -11:30am on weekends

## 2023-03-20 NOTE — Progress Notes (Signed)
PROGRESS NOTE   Leroy BORNT  Simmons:[id number] DOB: [birth year] DOA: 03/13/2023 PCP: de [place], Raymond J, MD   Date of Service: the patient was seen and examined on 03/20/2023  Brief Narrative:   Leroy Simmons is a 52 y.o. male with medical history significant for HFpEF (EF 60 to 65%), nonobstructive CAD, LLE DVT on Eliquis, insulin-dependent T2DM, HTN, CKD stage IIIa, asthma, HLD, depression/anxiety who presented to the ED for evaluation of left-sided chest and mid back pain. Patient was seen in the ED early morning of 6/9 for evaluation of a small abscess to his mid back.  I&D was performed in the ED with scant bloody drainage. T-spine x-ray was negative.  Patient was discharged with prescription for Bactrim. Unfortunately patient has not been able to obtain the antibiotic. Patient was hospitalized and placed on IV antibiotics.    Assessment and Plan:   Cellulitis/superficial abscess of mid back, treating  WBC 10 today. Afebrile. Day 5/7 of Linezolid.  No cultures available from previous I&D on 6/9. So far failed bactrim, doxycyline, CTX prior to Linezolid and infection seems to be worsening. See picture below. Gen surg consulted for I&D on 6/16: repeat CT showed no abscess, recommended cont with Abx, if no improvement will need wider excision of area -ID consulted-likely staph aureus. Recommended additional 1 week of additional linezolid  -Continue wound care per RN  -Continue Linezolid as per ID above  -Monitor with CBC  AKI on chronic kidney disease stage IIIa, treating  Improving. Creatinine 2.64, GFR 29 this morning, Baseline 1.7-2. Proteinuria 2/2 DM. Nephro: no acute indications for dialysis, suspect it is AKI may be due to combination of ATN associated with sepsis and hemodynamically mediated from exacerbation of diastolic heart failure.They started IV furosemide 80 mg twice daily which seems to be improving urine output and volume status. UOP 3.7L yesterday, net -3L since admission   -Nephrology following appreciate recs -continue diuresis with lasix 80mg  IV BID today, will transition to PO torsemide 40mg  BID tomorrow  -Monitor Is and Os -Daily weights  -Continue to monitor with BMP -Hold nephrotoxic agents  Hypokalemia K 3.6, stable  -Continue to monitor with BMP and replete   Uncontrolled Diabetes mellitus type 2, treating  CBGs overnight still elevated to 257-305.  Last A1c 11 in April -Monitor CBGs -Continue to hold metformin -Continue insulin sliding scale and meal time coverage 4 units -Increase Semglee to 22 units BID   Left-sided chest flank and back pain, resolved  Asymptomatic -Continue with pain medications -Explained to patient that if chest pain changes in character and intensity then he should let RN know   Chronic diastolic CHF, stable   -Continue metoprolol -Diuresis as above  Normocytic anemia, stable Hb 12, Baseline is 9-11.  Likely has anemia of chronic disease.  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 -D/c Eliquis on 6/16 to therapeutic lovenox incase pt needs surgery   Essential hypertension, stable BP 144/80 -Continue home medications including metoprolol Imdur and amlodipine.   Left lower lobe pulmonary nodule, stable 9 mm nodule identified.  Seems to be stable compared to 1 year ago.  Follows with Dr. Delton Coombes.   Coronary artery disease, stable Nonobstructive CAD by left heart catheterization in June 2023.  Minimal elevation in troponin is likely of no clinical significance.  Chest discomfort is likely musculoskeletal.  EKG without any new ischemic changes.   -Continue medical  management for now.   History of anxiety and depression, stable -Continue BuSpar.   Hyperlipidemia, stable -Continue statin   History of asthma, stable   Subjective:   Reports cramping abdominal pain today. Last BM was yesterday. Passing gas.    Physical Exam:  Vitals:   03/19/23 0809 03/19/23 2014 03/20/23 0049 03/20/23 0521  BP: (!) 144/80 (!) 143/81 (!) 149/95 (!) 146/94  Pulse: 78 82 84 84  Resp: 17 18 18 18   Temp: 98.4 F (36.9 C) 98.6 F (37 C) 98.4 F (36.9 C) 98.4 F (36.9 C)  TempSrc: Oral     SpO2: 98% 98% 100% 100%  Weight:      Height:       General: Alert, no acute distress, pleasant, well appearing  Cardio: well perfused  Pulm: normal work of breathing Abdomen: soft, mild generalized tenderness on palpation, no guarding  Extremities: peripheral edema improving  Neuro: Cranial nerves grossly intact  MSK: abscess over mid thoracic spine circumscribed with area of erythema increasing daily compared to previous exams     Data Reviewed:  I have personally reviewed and interpreted labs, imaging.  CBC: Recent Labs  Lab 03/16/23 0423 03/17/23 0415 03/18/23 0349 03/19/23 0452 03/20/23 0716  WBC 12.2* 10.6* 10.1 10.5 11.5*  HGB 10.2* 9.0* 10.3* 12.3* 12.6*  HCT 30.1* 26.9* 30.3* 36.0* 38.0*  MCV 88.3 88.5 86.8 86.1 88.2  PLT 261 262 336 398 385    Basic Metabolic Panel: Recent Labs  Lab 03/16/23 0423 03/17/23 0415 03/18/23 0349 03/19/23 0452 03/20/23 0716  NA 130* 133* 133* 132* 137  K 3.5 3.5 3.7 3.4* 3.6  CL 95* 97* 96* 94* 96*  CO2 25 25 24 25 28   GLUCOSE 276* 164* 203* 290* 263*  BUN 40* 55* 57* 56* 50*  CREATININE 3.91* 5.28* 4.12* 3.13* 2.64*  CALCIUM 8.2* 8.2* 8.6* 8.8* 9.0    GFR: Estimated Creatinine Clearance: 39.4 mL/min (A) (by C-G formula based on SCr of 2.64 mg/dL (H)). Liver Function Tests: Recent Labs  Lab 03/20/23 0716  AST 17  ALT 24  ALKPHOS 157*  BILITOT 0.5  PROT 7.3  ALBUMIN 2.6*    Coagulation Profile: No results for input(s): "INR", "PROTIME" in the last 168 hours.  Code Status:  Full code.  Code status decision has been confirmed with: patient  Family Communication:     Severity of Illness:  The appropriate patient status for this patient  is INPATIENT. Inpatient status is judged to be reasonable and necessary in order to provide the required intensity of service to ensure the patient's safety. The patient's presenting symptoms, physical exam findings, and initial radiographic and laboratory data in the context of their chronic comorbidities is felt to place them at high risk for further clinical deterioration. Furthermore, it is not anticipated that the patient will be medically stable for discharge from the hospital within 2 midnights of admission.   * I certify that at the point of admission it is my clinical judgment that the patient will require inpatient hospital care spanning beyond 2 midnights from the point of admission due to high intensity of service, high risk for further deterioration and high frequency of surveillance required.*   Author:  Rolm Gala MD  03/20/2023 9:19 AM

## 2023-03-20 NOTE — Progress Notes (Signed)
[facility]  Date of Admission:  03/13/2023      Total days of antibiotics 8  Linezolid 6/11 >> c           ASSESSMENT: Leroy Simmons is a 52 y.o. male admitted for worsening cellulitis / redness around recently lanced abscess on back.   Superficial Abscess w/ Cellulitis -  -Never picked up bactrim, failed doxy (though I don't see where he has picked this up either in record check) and required I&D. -CT scan reviewed with no abscess noted. Exam does not reveal any purulence or fluctuance. Surrounding erythema that has certainly expanded since 6/6 photos when first was lanced.  -Surgery following as well but no indications at this time to open up larger incision.  -Would favor a dose of long acting IV oritavancin to treat leftover cellulitis - this will give him 10-14d of additional coverage and cover typical offenders nicely including MRSA.    A/C Kidney Disease -  -eGFR 39  -per primary -Would stay away from bactrim for this reason and poor strep coverage     Previously on [place]/PrEP from HD in 2022 -  -He can come see [place] outpatient if he like for the same, information provided on D/C navigator.  -will screen HIV test now since not done on admission   PLAN: Slowly improving looking at photos and clinically no fluctuance, CT only with diffuse swelling and no organized process Would favor a dose of long acting oritivancin to give another 2 weeks of coverage and having to spare him to get pills  Would recommend FU with PCP/Surgery team to ensure no acute need to drain.  HIV Ab and hep b sAg screening ordered for AM    Principal Problem:   Sepsis due to cellulitis [facility]) Active Problems:   Hypertension associated with diabetes (HCC)   Asthma   Hyperlipidemia associated with type 2 diabetes mellitus (HCC)   Abscess of back, except buttock   Anxiety and depression   Coronary artery disease involving native coronary artery of native  heart without angina pectoris   Left lower lobe pulmonary nodule   Uncontrolled type 2 diabetes mellitus with hyperglycemia, with long-term current use of insulin (HCC)   Heart failure with preserved ejection fraction (HCC)   Deep venous thrombosis (DVT) of left peroneal vein (HCC)   Chronic kidney disease, stage 3a (HCC)   AKI (acute kidney injury) (HCC)    amLODipine  5 mg Oral Daily   atorvastatin  80 mg Oral Daily   enoxaparin (LOVENOX) injection  100 mg Subcutaneous Q12H   furosemide  80 mg Intravenous BID   insulin aspart  0-15 Units Subcutaneous TID WC   insulin aspart  4 Units Subcutaneous TID WC   [START ON 03/21/2023] insulin glargine-yfgn  22 Units Subcutaneous Daily   insulin glargine-yfgn  22 Units Subcutaneous QHS   isosorbide mononitrate  30 mg Oral Daily   linezolid  600 mg Oral Q12H   metoprolol succinate  50 mg Oral Daily   polyethylene glycol  17 g Oral BID   sodium chloride flush  3 mL Intravenous Q12H   [START ON 03/21/2023] torsemide  40 mg Oral BID    SUBJECTIVE: Doing OK. "They are still trying to sort out my kidneys".  No new concerns.    Review of Systems: Review of Systems  Constitutional:  Negative for chills, fever and malaise/fatigue.  Respiratory:  Negative for cough and  sputum production.   Cardiovascular:  Negative for chest pain and leg swelling.  Gastrointestinal:  Negative for abdominal pain, nausea and vomiting.  Genitourinary: Negative.   Musculoskeletal: Negative.   Skin:  Positive for rash.  Neurological: Negative.   Psychiatric/Behavioral: Negative.      No Known Allergies  OBJECTIVE: Vitals:   03/19/23 2014 03/20/23 0049 03/20/23 0521 03/20/23 1026  BP: (!) 143/81 (!) 149/95 (!) 146/94 (!) 167/94  Pulse: 82 84 84 87  Resp: 18 18 18    Temp: 98.6 F (37 C) 98.4 F (36.9 C) 98.4 F (36.9 C) 98.1 F (36.7 C)  TempSrc:      SpO2: 98% 100% 100% 97%  Weight:      Height:       Body mass index is 35.55 kg/m.  Physical  Exam Constitutional:      Appearance: He is well-developed. He is not ill-appearing.     Comments: Sitting comfortably on the side of the bed   Neurological:     Mental Status: He is alert.     Lab Results Lab Results  Component Value Date   WBC 11.5 (H) 03/20/2023   HGB 12.6 (L) 03/20/2023   HCT 38.0 (L) 03/20/2023   MCV 88.2 03/20/2023   PLT 385 03/20/2023    Lab Results  Component Value Date   CREATININE 2.64 (H) 03/20/2023   BUN 50 (H) 03/20/2023   NA 137 03/20/2023   K 3.6 03/20/2023   CL 96 (L) 03/20/2023   CO2 28 03/20/2023    Lab Results  Component Value Date   ALT 24 03/20/2023   AST 17 03/20/2023   ALKPHOS 157 (H) 03/20/2023   BILITOT 0.5 03/20/2023     Microbiology: No results found for this or any previous visit (from the past 240 hour(s)).   Rexene Alberts, MSN, NP-C [facility]  [email or web address] Pager: [phone] Office: [phone] [facility] Main Line: [phone] *Secure Chat Communication Welcome

## 2023-03-20 NOTE — Inpatient Diabetes Management (Signed)
Inpatient Diabetes Program Recommendations  AACE/ADA: New Consensus Statement on Inpatient Glycemic Control   Target Ranges:  Prepandial:   less than 140 mg/dL      Peak postprandial:   less than 180 mg/dL (1-2 hours)      Critically ill patients:  140 - 180 mg/dL    Latest Reference Range & Units 03/19/23 08:26 03/19/23 12:30 03/19/23 16:59 03/19/23 20:39 03/20/23 00:51 03/20/23 08:07  Glucose-Capillary 70 - 99 mg/dL 578 (H) 469 (H) 629 (H) 382 (H) 288 (H) 235 (H)   Review of Glycemic Control  Diabetes history: DM2 Outpatient Diabetes medications: Lantus 34 units daily, Humalog 5 units TID with meals Current orders for Inpatient glycemic control: Semglee 18 units QAM, Semglee 20 units at bedtime, Novolog 0-15 units TID with meals, Novolog 0-5 units QHS  Inpatient Diabetes Program Recommendations:    Insulin: Please consider increasing Semglee to 22 units BID and  ordering Novolog 4 units TID with meals for meal coverage if patient eats at least 50% of meals.  Thanks, Orlando Penner, RN, MSN, CDCES Diabetes Coordinator Inpatient Diabetes Program [phone] (Team Pager from 8am to 5pm)

## 2023-03-20 NOTE — Progress Notes (Signed)
Mobility Specialist Progress Note   03/20/23 1452  Mobility  Activity Ambulated with assistance in hallway  Level of Assistance Contact guard assist, steadying assist  Assistive Device None  Distance Ambulated (ft) 400 ft  Activity Response Tolerated well  Mobility Referral Yes  $Mobility charge 1 Mobility  Mobility Specialist Start Time (ACUTE ONLY) 1432  Mobility Specialist Stop Time (ACUTE ONLY) 1452  Mobility Specialist Time Calculation (min) (ACUTE ONLY) 20 min   Patient received in bed asleep but easily aroused and agreeable. Pt c/o distention in stomach but able to ambulate in hallway independently with steady gait. Returned to room without incident but pressure in stomach still present. Was left sitting in chair with all needs met, call bell in reach.   Frederico Hamman Mobility Specialist Please contact via SecureChat or  Rehab office at [phone]

## 2023-03-21 DIAGNOSIS — L0390 Cellulitis, unspecified: Secondary | ICD-10-CM | POA: Diagnosis not present

## 2023-03-21 DIAGNOSIS — L03312 Cellulitis of back [any part except buttock]: Secondary | ICD-10-CM | POA: Diagnosis not present

## 2023-03-21 DIAGNOSIS — A419 Sepsis, unspecified organism: Secondary | ICD-10-CM | POA: Diagnosis not present

## 2023-03-21 DIAGNOSIS — N179 Acute kidney failure, unspecified: Secondary | ICD-10-CM | POA: Diagnosis not present

## 2023-03-21 DIAGNOSIS — G061 Intraspinal abscess and granuloma: Secondary | ICD-10-CM | POA: Diagnosis not present

## 2023-03-21 LAB — RENAL FUNCTION PANEL
Albumin: 2.5 g/dL — ABNORMAL LOW (ref 3.5–5.0)
Anion gap: 12 (ref 5–15)
BUN: 44 mg/dL — ABNORMAL HIGH (ref 6–20)
CO2: 28 mmol/L (ref 22–32)
Calcium: 8.9 mg/dL (ref 8.9–10.3)
Chloride: 97 mmol/L — ABNORMAL LOW (ref 98–111)
Creatinine, Ser: 2.4 mg/dL — ABNORMAL HIGH (ref 0.61–1.24)
GFR, Estimated: 32 mL/min — ABNORMAL LOW (ref >60)
Glucose, Bld: 274 mg/dL — ABNORMAL HIGH (ref 70–99)
Phosphorus: 3.7 mg/dL (ref 2.5–4.6)
Potassium: 3.4 mmol/L — ABNORMAL LOW (ref 3.5–5.1)
Sodium: 137 mmol/L (ref 135–145)

## 2023-03-21 LAB — PROTEIN / CREATININE RATIO, URINE
Creatinine, Urine: 34 mg/dL
Protein Creatinine Ratio: 6.97 mg/mg{Cre} — ABNORMAL HIGH (ref 0.00–0.15)
Total Protein, Urine: 237 mg/dL

## 2023-03-21 LAB — GLUCOSE, CAPILLARY
Glucose-Capillary: 220 mg/dL — ABNORMAL HIGH (ref 70–99)
Glucose-Capillary: 246 mg/dL — ABNORMAL HIGH (ref 70–99)
Glucose-Capillary: 218 mg/dL — ABNORMAL HIGH (ref 70–99)
Glucose-Capillary: 289 mg/dL — ABNORMAL HIGH (ref 70–99)

## 2023-03-21 LAB — HEPATITIS B SURFACE ANTIGEN: Hepatitis B Surface Ag: NONREACTIVE

## 2023-03-21 LAB — HIV ANTIBODY (ROUTINE TESTING W REFLEX): HIV Screen 4th Generation wRfx: NONREACTIVE

## 2023-03-21 MED ORDER — BACLOFEN 10 MG PO TABS
10.0000 mg | ORAL_TABLET | Freq: Two times a day (BID) | ORAL | Status: DC | PRN
  Administered 2023-03-21: 10 mg via ORAL
  Filled 2023-03-21: qty 1

## 2023-03-21 MED ORDER — AMLODIPINE BESYLATE 10 MG PO TABS
10.0000 mg | ORAL_TABLET | Freq: Every day | ORAL | Status: DC
  Administered 2023-03-22: 10 mg via ORAL
  Filled 2023-03-21: qty 1

## 2023-03-21 MED ORDER — POTASSIUM CHLORIDE CRYS ER 20 MEQ PO TBCR
40.0000 meq | EXTENDED_RELEASE_TABLET | Freq: Once | ORAL | Status: AC
  Administered 2023-03-21: 40 meq via ORAL
  Filled 2023-03-21: qty 2

## 2023-03-21 NOTE — Progress Notes (Signed)
PROGRESS NOTE   Leroy Simmons  WJX:[id number] DOB: 02/25/1971 DOA: 03/13/2023 PCP: de [place], Raymond J, MD   Date of Service: the patient was seen and examined on 03/21/2023  Brief Narrative:   Leroy Simmons is a 52 y.o. male with medical history significant for HFpEF (EF 60 to 65%), nonobstructive CAD, LLE DVT on Eliquis, insulin-dependent T2DM, HTN, CKD stage IIIa, asthma, HLD, depression/anxiety who presented to the ED for evaluation of left-sided chest and mid back pain. Patient was seen in the ED early morning of 6/9 for evaluation of a small abscess to his mid back.  I&D was performed in the ED with scant bloody drainage. T-spine x-ray was negative.  Patient was discharged with prescription for Bactrim. Unfortunately patient has not been able to obtain the antibiotic. Patient was hospitalized and placed on IV antibiotics. 6/18: evaluated by surgery. No further surgical intervention needed at this time.    Assessment and Plan:   Cellulitis/superficial abscess of mid back. S/p I&D on 6/9, and 6/16.So far failed bactrim, doxycyline, CTX prior to Linezolid. repeat CT showed no abscess Afebrile.  - continue Linezolid through 6/19.  -ID consulted-likely staph aureus. Recommended additional 1 week of additional linezolid  -Continue wound care per RN -Monitor with CBC - general surgery following- continue medical management. - added muscle relaxer  AKI on chronic kidney disease stage IIIa, treating  Improving. Creatinine 5.28>>>>2.4. Baseline 1.7-2. Proteinuria 2/2 DM. Nephro: no acute indications for dialysis, suspect it is AKI may be due to combination of ATN associated with sepsis and hemodynamically mediated from exacerbation of diastolic heart failure. started IV furosemide 80 mg twice daily . -Nephrology following appreciate recs  - continue PO diuretics -Monitor Is and Os -Daily weights  -Continue to monitor with BMP -Hold nephrotoxic agents  Hypokalemia- K 3.9, stable   -Continue to monitor with BMP and replete  Uncontrolled Diabetes mellitus type 2, treating- Last A1c 11 in April -Monitor CBGs -Continue to hold metformin -Continue insulin sliding scale and meal time coverage 4 units -Increase Semglee to 22 units BID   Left-sided chest flank and back pain, resolved  Asymptomatic -Continue with pain medications -Explained to patient that if chest pain changes in character and intensity then he should let RN know   Chronic diastolic CHF, stable   -Continue metoprolol -Diuresis as above  Normocytic anemia, stable Hb 12, Baseline is 9-11.  Likely has anemia of chronic disease.  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 -D/c Eliquis on 6/16 to therapeutic lovenox incase pt needs surgery   Essential hypertension,poorly controlled.  -Continue home medications including metoprolol Imdur and amlodipine. - increased amlodipine to 10mg    Left lower lobe pulmonary nodule, stable 9 mm nodule identified.  Seems to be stable compared to 1 year ago.  Follows with Dr. Delton Coombes.   Coronary artery disease, stable Nonobstructive CAD by left heart catheterization in June 2023.  Minimal elevation in troponin is likely of no clinical significance.  Chest discomfort is likely musculoskeletal.  EKG without any new ischemic changes.   -Continue medical management   History of anxiety and depression, stable -Continue BuSpar.   Hyperlipidemia, stable -Continue statin   History of asthma, stable   Subjective:  Pt reports having significant cramping in his back. Has not taken anything for pain yet today. He did eat breakfast without issues.   Physical Exam:  Vitals:   03/20/23  1026 03/20/23 1612 03/20/23 2108 03/21/23 0425  BP: (!) 167/94 (!) 150/78 136/84 (!) 157/93  Pulse: 87 86 90 90  Resp:  17 18 19   Temp: 98.1 F (36.7 C) 98 F (36.7 C) 97.7 F (36.5 C) 98.8  F (37.1 C)  TempSrc:  Oral Oral Oral  SpO2: 97% 98% 96% 98%  Weight:      Height:       General: Alert, in mild distress Cardio: well perfused  Pulm: normal work of breathing Abdomen: soft, mild generalized tenderness on palpation, no guarding  Extremities: peripheral edema improving  Neuro: Cranial nerves grossly intact  MSK: abscess over mid thoracic spine circumscribed with area of erythema increasing daily compared to previous exams    Data Reviewed:  I have personally reviewed and interpreted labs, imaging.  CBC: Recent Labs  Lab 03/16/23 0423 03/17/23 0415 03/18/23 0349 03/19/23 0452 03/20/23 0716  WBC 12.2* 10.6* 10.1 10.5 11.5*  HGB 10.2* 9.0* 10.3* 12.3* 12.6*  HCT 30.1* 26.9* 30.3* 36.0* 38.0*  MCV 88.3 88.5 86.8 86.1 88.2  PLT 261 262 336 398 385    Basic Metabolic Panel: Recent Labs  Lab 03/16/23 0423 03/17/23 0415 03/18/23 0349 03/19/23 0452 03/20/23 0716  NA 130* 133* 133* 132* 137  K 3.5 3.5 3.7 3.4* 3.6  CL 95* 97* 96* 94* 96*  CO2 25 25 24 25 28   GLUCOSE 276* 164* 203* 290* 263*  BUN 40* 55* 57* 56* 50*  CREATININE 3.91* 5.28* 4.12* 3.13* 2.64*  CALCIUM 8.2* 8.2* 8.6* 8.8* 9.0    GFR: Estimated Creatinine Clearance: 39.4 mL/min (A) (by C-G formula based on SCr of 2.64 mg/dL (H)). Liver Function Tests: Recent Labs  Lab 03/20/23 0716  AST 17  ALT 24  ALKPHOS 157*  BILITOT 0.5  PROT 7.3  ALBUMIN 2.6*    Code Status:  Full code.  Code status decision has been confirmed with: patient  Family Communication:     Severity of Illness:  The appropriate patient status for this patient is INPATIENT. Inpatient status is judged to be reasonable and necessary in order to provide the required intensity of service to ensure the patient's safety. The patient's presenting symptoms, physical exam findings, and initial radiographic and laboratory data in the context of their chronic comorbidities is felt to place them at high risk for further  clinical deterioration. Furthermore, it is not anticipated that the patient will be medically stable for discharge from the hospital within 2 midnights of admission.   * I certify that at the point of admission it is my clinical judgment that the patient will require inpatient hospital care spanning beyond 2 midnights from the point of admission due to high intensity of service, high risk for further deterioration and high frequency of surveillance required.*   Author:  Leeroy Bock MD  03/21/2023 7:21 AM

## 2023-03-21 NOTE — Progress Notes (Signed)
Subjective: Feeling better overall  ROS: See above, otherwise other systems negative  Objective: Vital signs in last 24 hours: Temp:  [97.7 F (36.5 C)-98.8 F (37.1 C)] 97.9 F (36.6 C) (06/18 0759) Pulse Rate:  [86-91] 91 (06/18 0759) Resp:  [17-19] 19 (06/18 0425) BP: (136-171)/(78-108) 171/108 (06/18 0759) SpO2:  [96 %-100 %] 100 % (06/18 0759) Last BM Date : 03/18/23  Intake/Output from previous day: 06/17 0701 - 06/18 0700 In: 586 [P.O.:586] Out: 1050 [Urine:1050] Intake/Output this shift: Total I/O In: 240 [P.O.:240] Out: 750 [Urine:750]  PE: Gen: NAD Skin: back with less intense erythema and overall resolving and healing wound.  No infection noted  Lab Results:  Recent Labs    03/19/23 0452 03/20/23 0716  WBC 10.5 11.5*  HGB 12.3* 12.6*  HCT 36.0* 38.0*  PLT 398 385   BMET Recent Labs    03/19/23 0452 03/20/23 0716  NA 132* 137  K 3.4* 3.6  CL 94* 96*  CO2 25 28  GLUCOSE 290* 263*  BUN 56* 50*  CREATININE 3.13* 2.64*  CALCIUM 8.8* 9.0   PT/INR No results for input(s): "LABPROT", "INR" in the last 72 hours. CMP     Component Value Date/Time   NA 137 03/20/2023 0716   NA 139 03/29/2022 1059   K 3.6 03/20/2023 0716   CL 96 (L) 03/20/2023 0716   CO2 28 03/20/2023 0716   GLUCOSE 263 (H) 03/20/2023 0716   BUN 50 (H) 03/20/2023 0716   BUN 29 (H) 03/29/2022 1059   CREATININE 2.64 (H) 03/20/2023 0716   CALCIUM 9.0 03/20/2023 0716   PROT 7.3 03/20/2023 0716   ALBUMIN 2.6 (L) 03/20/2023 0716   AST 17 03/20/2023 0716   ALT 24 03/20/2023 0716   ALKPHOS 157 (H) 03/20/2023 0716   BILITOT 0.5 03/20/2023 0716   GFRNONAA 29 (L) 03/20/2023 0716   GFRAA  10/26/2010 1143    >60        The eGFR has been calculated using the MDRD equation. This calculation has not been validated in all clinical situations. eGFR's persistently <60 mL/min signify possible Chronic Kidney Disease.   Lipase     Component Value Date/Time   LIPASE 39  09/25/2022 1439       Studies/Results: CT CHEST WO CONTRAST  Result Date: 03/19/2023 CLINICAL DATA:  Follow-up back wound, initial encounter EXAM: CT CHEST WITHOUT CONTRAST TECHNIQUE: Multidetector CT imaging of the chest was performed following the standard protocol without IV contrast. RADIATION DOSE REDUCTION: This exam was performed according to the departmental dose-optimization program which includes automated exposure control, adjustment of the mA and/or kV according to patient size and/or use of iterative reconstruction technique. COMPARISON:  03/13/2023 FINDINGS: Cardiovascular: Somewhat limited due to lack of IV contrast. No aneurysmal dilatation of the aorta is noted. Coronary calcifications are seen. Heart is mildly enlarged in size. Mediastinum/Nodes: Thoracic inlet is within normal limits. No hilar or mediastinal adenopathy is noted. The esophagus as visualized is within normal limits. Lungs/Pleura: Lungs are well aerated bilaterally. No focal infiltrate or effusion is seen. No parenchymal nodules are noted. Upper Abdomen: Visualized upper abdomen is within normal limits. Musculoskeletal: Persistent subcutaneous edema and skin thickening is noted in the midportion of the back again in the region of T9-T10. No definitive abscess is seen. No new focal abnormality is noted. IMPRESSION: Stable changes in the midline of the lower chest wall again likely related to cellulitis. No discrete abscess is noted. No other focal  abnormality is noted. Electronically Signed   By: Alcide Clever M.D.   On: 03/19/2023 21:13    Anti-infectives: Anti-infectives (From admission, onward)    Start     Dose/Rate Route Frequency Ordered Stop   03/14/23 1000  linezolid (ZYVOX) tablet 600 mg        600 mg Oral Every 12 hours 03/14/23 0854 03/27/23 2359   03/14/23 0030  cefTRIAXone (ROCEPHIN) 2 g in sodium chloride 0.9 % 100 mL IVPB  Status:  Discontinued        2 g 200 mL/hr over 30 Minutes Intravenous Every 24  hours 03/14/23 0010 03/14/23 0854   03/13/23 2330  cefTRIAXone (ROCEPHIN) 1 g in sodium chloride 0.9 % 100 mL IVPB  Status:  Discontinued        1 g 200 mL/hr over 30 Minutes Intravenous  Once 03/13/23 2324 03/14/23 0015   03/13/23 1700  doxycycline (VIBRA-TABS) tablet 100 mg        100 mg Oral  Once 03/13/23 1655 03/13/23 1719        Assessment/Plan Back abscess, s/p I&D -improving after previous I&D and abx therapy -abx therapy per ID -wound much improved today -no plans for surgical intervention or further needs.  We will sign off at this time.  Message sent to primary MD regarding surgical recommendations   FEN - renal diet VTE - Lovenox ID - zyvox  CKD CHF DM HTN  I reviewed Consultant  ID notes, hospitalist notes, last 24 h vitals and pain scores, last 48 h intake and output, last 24 h labs and trends, and last 24 h imaging results.   LOS: 8 days    Letha Cape , [facility] 03/21/2023, 8:53 AM Please see Amion for pager number during day hours 7:00am-4:30pm or 7:00am -11:30am on weekends

## 2023-03-21 NOTE — Progress Notes (Signed)
Patient ID: Leroy Simmons, male   DOB: [birth year], 52 y.o.   MRN: [id number] [facility] Progress Note   Assessment/ Plan:   1. Acute kidney Injury on chronic kidney disease stage IIIa: Nonoliguric. Urine studies significant for proteinuria (likely related to his diabetes mellitus) and without any indication for GN-workup so far. Appears to favor ATN and is resolving.   - Renal function is improving.  Will follow up labs today - Transitioned to PO diuretics today - he confirms his home regimen has been torsemide 40 mg BID   - I have reordered daily weights and strict ins/outs - will ask that his legs be wrapped  2.  Sepsis secondary to abscess/cellulitis of mid back: Status post incision and drainage.  Abx per primary team and ID.  Note that surgery and ID were consulted.   3.  Hypertension: optimize volume status as above  4.  Hyponatremia: Resolved.  Secondary to acute kidney injury and impaired free water handling/unrestricted intake.  Continue fluid restriction of 1.2 L/day   5. CKD stage 3a - baseline Cr appears 1.5 - 1.8  6.  Proteinuria - likely 2/2 DM in the setting of A1c 11.0 in 01/2023. Obtain updated urine protein/cr ratio.  In 02/2023, note he had additional labs sent - ANCA was negative. ANA was positive.   7.  Hypokalemia: Secondary to diuretic induced losses; replete orally PRN. Improved  8. ANA positive - outpatient rheum referral if he has not already had this  Disposition - Resolving AKI and transitioning to PO diuretics.  Will set up for routine nephrology follow-up on discharge    Subjective:   Strict ins/outs not obtained.  He had 1.1 liters UOP as well as one unmeasured urine void over 6/17.  He has been on lasix 80 mg IV BID - rec'd last dose on 6/17 pm. He just washed off and is more alert today.  He needs a PCP and a nephrologist.      Review of systems:   States urinating without difficulty but they are not always documenting it Denies shortness  of breath or chest pain Has had some nausea without vomiting  Ambulating in room  Some cramping yesterday    Objective:   BP (!) 157/93 (BP Location: Right Arm)   Pulse 90   Temp 98.8 F (37.1 C) (Oral)   Resp 19   Ht 5' 7.5" (1.715 m)   Wt 104.5 kg   SpO2 98%   BMI 35.55 kg/m   Intake/Output Summary (Last 24 hours) at 03/21/2023 9629 Last data filed at 03/20/2023 2227 Gross per 24 hour  Intake 586 ml  Output 1050 ml  Net -464 ml   Weight change:   Physical Exam:    General adult male in bed in no acute distress HEENT normocephalic atraumatic extraocular movements intact sclera anicteric Neck supple trachea midline Lungs clear to auscultation bilaterally normal work of breathing at rest  Heart regular rate and rhythm no rubs or gallops appreciated Abdomen soft nontender nondistended Extremities 1-2+ edema  Psych normal mood and affect Neuro - alert and oriented x 3 provides hx and follows commands   Imaging: CT CHEST WO CONTRAST  Result Date: 03/19/2023 CLINICAL DATA:  Follow-up back wound, initial encounter EXAM: CT CHEST WITHOUT CONTRAST TECHNIQUE: Multidetector CT imaging of the chest was performed following the standard protocol without IV contrast. RADIATION DOSE REDUCTION: This exam was performed according to the departmental dose-optimization program which includes automated exposure control, adjustment of  the mA and/or kV according to patient size and/or use of iterative reconstruction technique. COMPARISON:  03/13/2023 FINDINGS: Cardiovascular: Somewhat limited due to lack of IV contrast. No aneurysmal dilatation of the aorta is noted. Coronary calcifications are seen. Heart is mildly enlarged in size. Mediastinum/Nodes: Thoracic inlet is within normal limits. No hilar or mediastinal adenopathy is noted. The esophagus as visualized is within normal limits. Lungs/Pleura: Lungs are well aerated bilaterally. No focal infiltrate or effusion is seen. No parenchymal nodules  are noted. Upper Abdomen: Visualized upper abdomen is within normal limits. Musculoskeletal: Persistent subcutaneous edema and skin thickening is noted in the midportion of the back again in the region of T9-T10. No definitive abscess is seen. No new focal abnormality is noted. IMPRESSION: Stable changes in the midline of the lower chest wall again likely related to cellulitis. No discrete abscess is noted. No other focal abnormality is noted. Electronically Signed   By: Alcide Clever M.D.   On: 03/19/2023 21:13    Labs: BMET Recent Labs  Lab 03/15/23 0302 03/16/23 0423 03/17/23 0415 03/18/23 0349 03/19/23 0452 03/20/23 0716  NA 133* 130* 133* 133* 132* 137  K 3.8 3.5 3.5 3.7 3.4* 3.6  CL 97* 95* 97* 96* 94* 96*  CO2 23 25 25 24 25 28   GLUCOSE 250* 276* 164* 203* 290* 263*  BUN 34* 40* 55* 57* 56* 50*  CREATININE 2.67* 3.91* 5.28* 4.12* 3.13* 2.64*  CALCIUM 9.0 8.2* 8.2* 8.6* 8.8* 9.0   CBC Recent Labs  Lab 03/17/23 0415 03/18/23 0349 03/19/23 0452 03/20/23 0716  WBC 10.6* 10.1 10.5 11.5*  HGB 9.0* 10.3* 12.3* 12.6*  HCT 26.9* 30.3* 36.0* 38.0*  MCV 88.5 86.8 86.1 88.2  PLT 262 336 398 385    Medications:     amLODipine  5 mg Oral Daily   atorvastatin  80 mg Oral Daily   enoxaparin (LOVENOX) injection  100 mg Subcutaneous Q12H   insulin aspart  0-15 Units Subcutaneous TID WC   insulin aspart  4 Units Subcutaneous TID WC   insulin glargine-yfgn  22 Units Subcutaneous Daily   insulin glargine-yfgn  22 Units Subcutaneous QHS   isosorbide mononitrate  30 mg Oral Daily   linezolid  600 mg Oral Q12H   metoprolol succinate  50 mg Oral Daily   polyethylene glycol  17 g Oral BID   sodium chloride flush  3 mL Intravenous Q12H   torsemide  40 mg Oral BID   Estanislado Emms, MD 03/21/2023,7:24 AM

## 2023-03-21 NOTE — Progress Notes (Signed)
[facility]  Date of Admission:  03/13/2023      Total days of antibiotics 9  Linezolid 6/11 >> c           ASSESSMENT: Leroy Simmons is a 52 y.o. male admitted for worsening cellulitis / redness around recently lanced abscess on back.   Superficial Abscess w/ Cellulitis -  -Never picked up bactrim, failed doxy (though I don't see where he has picked this up either in record check) and required I&D. He says he has no problems picking up his medications.  -CT scan reviewed with no abscess noted. Exam does not reveal any purulence or fluctuance. Surrounding erythema today looks improved as has swelling.  -will have TOC bring meds to bed but plan to finish another 1.5 weeks of linezolid    A/C Kidney Disease -  -improving with SCr down to 2.4 -per nephrology -Would stay away from bactrim for this reason and poor strep coverage     Previously on [place]/PrEP from [facility] in 2022 -  -He can come see [place] outpatient if he like for the same, information provided on D/C navigator.  -screenings returned negative.     PLAN: Continue linezolid for another 10 days Will ask TOC pharm to bring meds to bed   *FU with pcp outpatient. Would not anticipate any further abx to be needed at that time. ID will sign off - please call back with any questions/concerns or if we can be of further assistance.     Principal Problem:   Sepsis due to cellulitis [facility]) Active Problems:   Hypertension associated with diabetes (HCC)   Asthma   Hyperlipidemia associated with type 2 diabetes mellitus (HCC)   Abscess of back, except buttock   Anxiety and depression   Coronary artery disease involving native coronary artery of native heart without angina pectoris   Left lower lobe pulmonary nodule   Uncontrolled type 2 diabetes mellitus with hyperglycemia, with long-term current use of insulin (HCC)   Heart failure with preserved ejection fraction (HCC)   Deep venous  thrombosis (DVT) of left peroneal vein (HCC)   Chronic kidney disease, stage 3a (HCC)   AKI (acute kidney injury) (HCC)    amLODipine  5 mg Oral Daily   atorvastatin  80 mg Oral Daily   enoxaparin (LOVENOX) injection  100 mg Subcutaneous Q12H   insulin aspart  0-15 Units Subcutaneous TID WC   insulin aspart  4 Units Subcutaneous TID WC   insulin glargine-yfgn  22 Units Subcutaneous Daily   insulin glargine-yfgn  22 Units Subcutaneous QHS   isosorbide mononitrate  30 mg Oral Daily   linezolid  600 mg Oral Q12H   metoprolol succinate  50 mg Oral Daily   polyethylene glycol  17 g Oral BID   sodium chloride flush  3 mL Intravenous Q12H   torsemide  40 mg Oral BID    SUBJECTIVE: Doing OK. Feeling cold and has abdominal cramping today    Review of Systems: Review of Systems  Constitutional:  Negative for chills, fever and malaise/fatigue.  Respiratory:  Negative for cough and sputum production.   Cardiovascular:  Negative for chest pain and leg swelling.  Gastrointestinal:  Negative for abdominal pain, nausea and vomiting.  Genitourinary: Negative.   Musculoskeletal: Negative.   Skin:  Positive for rash.  Neurological: Negative.   Psychiatric/Behavioral: Negative.      No Known Allergies  OBJECTIVE: Vitals:   03/20/23  2108 03/21/23 0425 03/21/23 0759 03/21/23 1104  BP: 136/84 (!) 157/93 (!) 171/108 (!) 153/91  Pulse: 90 90 91 86  Resp: 18 19    Temp: 97.7 F (36.5 C) 98.8 F (37.1 C) 97.9 F (36.6 C) 98.3 F (36.8 C)  TempSrc: Oral Oral Oral Oral  SpO2: 96% 98% 100% 99%  Weight:      Height:       Body mass index is 35.55 kg/m.  Physical Exam Constitutional:      Appearance: He is well-developed. He is not ill-appearing.     Comments: Sitting comfortably on the side of the bed   Neurological:     Mental Status: He is alert.     Lab Results Lab Results  Component Value Date   WBC 11.5 (H) 03/20/2023   HGB 12.6 (L) 03/20/2023   HCT 38.0 (L) 03/20/2023    MCV 88.2 03/20/2023   PLT 385 03/20/2023    Lab Results  Component Value Date   CREATININE 2.40 (H) 03/21/2023   BUN 44 (H) 03/21/2023   NA 137 03/21/2023   K 3.4 (L) 03/21/2023   CL 97 (L) 03/21/2023   CO2 28 03/21/2023    Lab Results  Component Value Date   ALT 24 03/20/2023   AST 17 03/20/2023   ALKPHOS 157 (H) 03/20/2023   BILITOT 0.5 03/20/2023     Microbiology: No results found for this or any previous visit (from the past 240 hour(s)).   Rexene Alberts, MSN, NP-C [facility] Infectious Disease [facility]  [email or web address] Pager: [phone] Office: [phone] [facility] Main Line: [phone] *Secure Chat Communication Welcome

## 2023-03-22 ENCOUNTER — Encounter: Payer: Self-pay | Admitting: Family Medicine

## 2023-03-22 ENCOUNTER — Other Ambulatory Visit (HOSPITAL_COMMUNITY): Payer: Self-pay

## 2023-03-22 DIAGNOSIS — L0390 Cellulitis, unspecified: Secondary | ICD-10-CM | POA: Diagnosis not present

## 2023-03-22 DIAGNOSIS — A419 Sepsis, unspecified organism: Secondary | ICD-10-CM | POA: Diagnosis not present

## 2023-03-22 LAB — CBC
HCT: 36.5 % — ABNORMAL LOW (ref 39.0–52.0)
Hemoglobin: 12.1 g/dL — ABNORMAL LOW (ref 13.0–17.0)
MCH: 29.6 pg (ref 26.0–34.0)
MCHC: 33.2 g/dL (ref 30.0–36.0)
MCV: 89.2 fL (ref 80.0–100.0)
Platelets: 382 10*3/uL (ref 150–400)
RBC: 4.09 MIL/uL — ABNORMAL LOW (ref 4.22–5.81)
RDW: 12.2 % (ref 11.5–15.5)
WBC: 8 10*3/uL (ref 4.0–10.5)
nRBC: 0 % (ref 0.0–0.2)

## 2023-03-22 LAB — RENAL FUNCTION PANEL
Albumin: 2.5 g/dL — ABNORMAL LOW (ref 3.5–5.0)
Anion gap: 13 (ref 5–15)
BUN: 43 mg/dL — ABNORMAL HIGH (ref 6–20)
CO2: 27 mmol/L (ref 22–32)
Calcium: 8.9 mg/dL (ref 8.9–10.3)
Chloride: 98 mmol/L (ref 98–111)
Creatinine, Ser: 2.23 mg/dL — ABNORMAL HIGH (ref 0.61–1.24)
GFR, Estimated: 35 mL/min — ABNORMAL LOW (ref >60)
Glucose, Bld: 309 mg/dL — ABNORMAL HIGH (ref 70–99)
Phosphorus: 3.8 mg/dL (ref 2.5–4.6)
Potassium: 4.1 mmol/L (ref 3.5–5.1)
Sodium: 138 mmol/L (ref 135–145)

## 2023-03-22 LAB — GLUCOSE, CAPILLARY: Glucose-Capillary: 201 mg/dL — ABNORMAL HIGH (ref 70–99)

## 2023-03-22 MED ORDER — APIXABAN 5 MG PO TABS
5.0000 mg | ORAL_TABLET | Freq: Two times a day (BID) | ORAL | Status: DC
  Administered 2023-03-22: 5 mg via ORAL
  Filled 2023-03-22: qty 1

## 2023-03-22 MED ORDER — APIXABAN 5 MG PO TABS
5.0000 mg | ORAL_TABLET | Freq: Two times a day (BID) | ORAL | 0 refills | Status: DC
  Filled 2023-03-22: qty 60, 30d supply, fill #0

## 2023-03-22 MED ORDER — METOLAZONE 5 MG PO TABS
5.0000 mg | ORAL_TABLET | Freq: Once | ORAL | Status: AC
  Administered 2023-03-22: 5 mg via ORAL
  Filled 2023-03-22: qty 1

## 2023-03-22 MED ORDER — INSULIN GLARGINE 100 UNITS/ML SOLOSTAR PEN
22.0000 [IU] | PEN_INJECTOR | Freq: Every day | SUBCUTANEOUS | 1 refills | Status: DC
  Filled 2023-03-22: qty 12, 54d supply, fill #0

## 2023-03-22 MED ORDER — SENNOSIDES-DOCUSATE SODIUM 8.6-50 MG PO TABS
1.0000 | ORAL_TABLET | Freq: Two times a day (BID) | ORAL | 0 refills | Status: DC
  Filled 2023-03-22: qty 60, 30d supply, fill #0

## 2023-03-22 MED ORDER — POLYETHYLENE GLYCOL 3350 17 GM/SCOOP PO POWD
17.0000 g | Freq: Two times a day (BID) | ORAL | 0 refills | Status: DC
  Filled 2023-03-22: qty 238, 7d supply, fill #0

## 2023-03-22 MED ORDER — AMLODIPINE BESYLATE 10 MG PO TABS
10.0000 mg | ORAL_TABLET | Freq: Every day | ORAL | 3 refills | Status: DC
  Filled 2023-03-22: qty 30, 30d supply, fill #0

## 2023-03-22 MED ORDER — BUSPIRONE HCL 5 MG PO TABS
5.0000 mg | ORAL_TABLET | Freq: Two times a day (BID) | ORAL | 0 refills | Status: DC | PRN
  Filled 2023-03-22: qty 60, 30d supply, fill #0

## 2023-03-22 MED ORDER — LINEZOLID 600 MG PO TABS
600.0000 mg | ORAL_TABLET | Freq: Two times a day (BID) | ORAL | 0 refills | Status: AC
  Filled 2023-03-22: qty 18, 9d supply, fill #0

## 2023-03-22 MED ORDER — INSULIN GLARGINE 100 UNITS/ML SOLOSTAR PEN
22.0000 [IU] | PEN_INJECTOR | Freq: Two times a day (BID) | SUBCUTANEOUS | 1 refills | Status: DC
Start: 2023-03-22 — End: 2023-09-09
  Filled 2023-03-22: qty 15, 30d supply, fill #0
  Filled 2023-06-07: qty 12, 27d supply, fill #0
  Filled 2023-06-07: qty 15, 30d supply, fill #1
  Filled 2023-08-18: qty 3, 7d supply, fill #1

## 2023-03-22 MED ORDER — OXYCODONE HCL 5 MG PO TABS
5.0000 mg | ORAL_TABLET | ORAL | 0 refills | Status: DC | PRN
  Filled 2023-03-22: qty 30, 5d supply, fill #0

## 2023-03-22 MED ORDER — SENNOSIDES-DOCUSATE SODIUM 8.6-50 MG PO TABS
1.0000 | ORAL_TABLET | Freq: Two times a day (BID) | ORAL | Status: DC
  Administered 2023-03-22: 1 via ORAL
  Filled 2023-03-22: qty 1

## 2023-03-22 MED ORDER — BACLOFEN 10 MG PO TABS
10.0000 mg | ORAL_TABLET | Freq: Two times a day (BID) | ORAL | 0 refills | Status: DC | PRN
  Filled 2023-03-22: qty 30, 15d supply, fill #0

## 2023-03-22 NOTE — Discharge Summary (Addendum)
Physician Discharge Summary  Leroy Simmons XBJ:[id number] DOB: January 02, 1971 DOA: 03/13/2023  PCP: de [place], Raymond J, MD  Admit date: 03/13/2023 Discharge date: 03/22/2023  Time spent: 37 minutes  Recommendations for Outpatient Follow-up:  Complete Zyvox in the outpatient setting as per ID Requires home health nursing to supervise medications (patient was taking insulins incorrectly, was on both atorvastatin + Crestor etc. etc.) Suggest CBC Chem-7 1 week's time PCP to determine if Eliquis should be continued-patient has history of LLE DVT that is not characterized well in the note in terms of how long he has been on blood thinner Follow nephrology labs SPEP, light chains and will need outpatient referral to nephrology Please consider referral to rheumatology/repeat ANA titers in the outpatient setting as per PCP  Discharge Diagnoses:  MAIN problem for hospitalization   Infection of upper back with cellulitis HFpEF HTN nonobstructive CAD previously AKI this hospital stay with resolution LLE DVT supposed to be on Eliquis 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  ANA positive Pulmonary nodule followed in the outpatient by pulmonology   Please see below for itemized issues addressed in HOpsital- refer to other progress notes for clarity if needed  Discharge Condition: Improved  Diet recommendation: Diabetic heart healthy  Filed Weights   03/13/23 1626 03/19/23 0733 03/22/23 0600  Weight: 104.3 kg 104.5 kg 98 kg    History of present illness:  52 year old white male (previously staying at a boardinghouse) HF PEF EF 55-60% 03/17/2022-left heart cath 6/19 = nonobstructive CAD 50% stenosis Gangrenous appendicitis 09/2022 + peritoneal abscess + lap appendectomy abscess drain placement 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 Malen Gauze in the outpatient-CC at discharge time Wrapped lower extremities as able at discharge  Left lower extremity peroneal veins DVT 01/2023 Resume Eliquis-appears to not have been taking in the outpatient setting-coupon may be needed-sent to [facility] pharmacy  Stable asthma LLL pulmonary nodule 9 mm-outpatient follow-up with Dr. Delton Coombes of pulmonology as per their protocol will CC  Nonobstructive CAD 03/2022 based on cath Poorly controlled HTN HF PEF stable without any acute component on hospitalization Had some chest pain during hospital stay but did not seem cardiac--meds were titrated during hospital stay and changed around-he is noncompliant on several  Poorly controlled DM TY 2 A1c 08/2023 Discontinue forever  metformin, continue  sliding scale coverage at home and lowered dose of long-acting   Discharge Exam: Vitals:   03/22/23 0912 03/22/23 0912  BP: (!) 141/88 (!) 141/88  Pulse: 90 90  Resp: 20 20  Temp: 98.2 F (36.8 C) 98.2 F (36.8 C)  SpO2: 98% 99%    Subj on day of d/c   Awake coherent no distress no icterus no pallor no wheeze no rales no rhonchi Back exam as above per picture Chest is clear S1-S2 no murmur Abdomen soft no rebound No lower extremity edema Psych flat disheveled affect  Discharge Instructions   Discharge Instructions     Change dressing (specify)   Complete by: As directed    Dressing change: Once times per day using nonocclusive dressing until it is all healed up.   Diet - low sodium heart healthy   Complete by: As directed    Discharge instructions   Complete by: As directed    Make sure that you complete all the antibiotics for the infection on your back We will get a home health nurse to make sure that you are taking your medications carefully and that you do not miss doses or confused your insulin says that sometimes can occur with even the best people You will notice that we have stopped various medications including lisinopril Bactrim and Lasix as well as blood pressure medications called hydralazine and hydroxyzine-please do not take them We have added amlodipine 10 mg and adjusted downwards your Lantus Please take your short acting insulin which is a different insulin at the dose prescribed You will get a limited amount of baclofen and oxycodone for your back and eventually you can come off these medications Please follow-up with your primary physician in about 1 week's time--- if you experience fever chills nausea vomiting back pain oozing dizziness shortness of breath please seek medical attention   Increase activity slowly   Complete by: As directed       Allergies as of 03/22/2023   No Known Allergies      Medication List     STOP  taking these medications    furosemide 20 MG tablet Commonly known as: LASIX   hydrALAZINE 50 MG tablet Commonly known as: APRESOLINE   hydrOXYzine 25 MG capsule Commonly known as: VISTARIL   lisinopril 20 MG tablet Commonly known as: ZESTRIL   rosuvastatin 20 MG tablet Commonly known as: CRESTOR   sulfamethoxazole-trimethoprim 800-160 MG tablet Commonly known as: BACTRIM DS       TAKE these medications    Admelog SoloStar 100 UNIT/ML KwikPen Generic drug: insulin lispro Inject 5 Units into the skin 3 (three) times daily before meals. What changed:  how much to take when to take this   albuterol 108 (90 Base) MCG/ACT inhaler Commonly known as: VENTOLIN HFA Inhale 2 puffs into the lungs every 6 (six) hours as needed for wheezing or shortness of breath.   amLODipine 10 MG tablet Commonly known as: NORVASC Take 1 tablet (10 mg total) by mouth daily. What changed:  medication strength how much to take   apixaban 5 MG Tabs tablet Commonly known as: ELIQUIS Take 2 tablets twice daily until 01/27/23, then continue with one tablet twice daily starting on 01/28/23. What changed:  how much to take how to take this when to take this additional instructions   aspirin EC 81 MG tablet Take 81 mg by mouth daily.   atorvastatin 80 MG tablet Commonly known as: LIPITOR Take 1 tablet (80 mg total)  by mouth daily.   baclofen 10 MG tablet Commonly known as: LIORESAL Take 1 tablet (10 mg total) by mouth 2 (two) times daily as needed for muscle spasms.   blood glucose meter kit and supplies Dispense based on patient and insurance preference. Use up to four times daily as directed. (FOR ICD-10 E10.9, E11.9).   busPIRone 5 MG tablet Commonly known as: BUSPAR Take 1 tablet (5 mg total) by mouth 2 (two) times daily as needed (anxiety). What changed: when to take this   insulin glargine 100 unit/mL Sopn Commonly known as: LANTUS Inject 22 Units into the skin 2 (two) times  daily. What changed:  how much to take when to take this   isosorbide mononitrate 30 MG 24 hr tablet Commonly known as: IMDUR Take 1 tablet (30 mg total) by mouth daily.   linezolid 600 MG tablet Commonly known as: ZYVOX Take 1 tablet (600 mg total) by mouth 2 (two) times daily for 9 days.   metoprolol succinate 50 MG 24 hr tablet Commonly known as: TOPROL-XL Take 50 mg by mouth daily. Take with or immediately following a meal.   oxyCODONE 5 MG immediate release tablet Commonly known as: Oxy IR/ROXICODONE Take 1 tablet (5 mg total) by mouth every 4 (four) hours as needed for moderate pain.   polyethylene glycol powder 17 GM/SCOOP powder Commonly known as: GLYCOLAX/MIRALAX Take 17 g by mouth 2 (two) times daily.   prednisoLONE acetate 1 % ophthalmic suspension Commonly known as: PRED FORTE Place 1 drop into the left eye daily. Left eye: 1 drop 4 times daily for 7 days, 1 drop 3 times daily for 7 days, 1 drop 2 times daily for 7 days, 1 drop once daily for 7 days, stop.   senna-docusate 8.6-50 MG tablet Commonly known as: Senokot-S Take 1 tablet by mouth 2 (two) times daily.   torsemide 20 MG tablet Commonly known as: DEMADEX Take 2 tablets (40 mg total) by mouth 2 (two) times daily.               Discharge Care Instructions  (From admission, onward)           Start     Ordered   03/22/23 0000  Change dressing (specify)       Comments: Dressing change: Once times per day using nonocclusive dressing until it is all healed up.   03/22/23 0935          He has been have a good time of them may have activities and No Known Allergies  Follow-up Information     de [place], Buren Kos, MD. Schedule an appointment as soon as possible for a visit.   Specialty: Family Medicine Why: Call the office and schedule a hospital follow up in the next 7-10 days. Contact information: [address] [place] [address] [phone]         [facility] Follow up.   Specialty: Infectious Diseases Why: As needed for PrEP - call to set up an appointment in our clinic if interested Contact information: [address] [place], [place] [address] [phone] mc [place] [address] [phone]                 The results of significant diagnostics from this hospitalization (including imaging, microbiology, ancillary and laboratory) are listed below for reference.    Significant Diagnostic Studies: CT CHEST WO CONTRAST  Result Date: 03/19/2023 CLINICAL DATA:  Follow-up back wound, initial encounter EXAM:  CT CHEST WITHOUT CONTRAST TECHNIQUE: Multidetector CT imaging of the chest was performed following the standard protocol without IV contrast. RADIATION DOSE REDUCTION: This exam was performed according to the departmental dose-optimization program which includes automated exposure control, adjustment of the mA and/or kV according to patient size and/or use of iterative reconstruction technique. COMPARISON:  03/13/2023 FINDINGS: Cardiovascular: Somewhat limited due to lack of IV contrast. No aneurysmal dilatation of the aorta is noted. Coronary calcifications are seen. Heart is mildly enlarged in size. Mediastinum/Nodes: Thoracic inlet is within normal limits. No hilar or mediastinal adenopathy is noted. The esophagus as visualized is within normal limits. Lungs/Pleura: Lungs are well aerated bilaterally. No focal infiltrate or effusion is seen. No parenchymal nodules are noted. Upper Abdomen: Visualized upper abdomen is within normal limits. Musculoskeletal: Persistent subcutaneous edema and skin thickening is noted in the midportion of the back again in the region of T9-T10. No definitive abscess is seen. No new focal abnormality is noted. IMPRESSION: Stable changes in the midline of the lower chest wall again likely related to cellulitis. No discrete abscess is noted. No other focal abnormality is noted.  Electronically Signed   By: Alcide Clever M.D.   On: 03/19/2023 21:13   US RENAL  Result Date: 03/16/2023 CLINICAL DATA:  Acute kidney injury EXAM: RENAL / URINARY TRACT ULTRASOUND COMPLETE COMPARISON:  01/18/2023 FINDINGS: Right Kidney: Renal measurements: 11.3 x 6.1 x 7.3 cm = volume: 263 mL. Echogenicity within normal limits. No mass or hydronephrosis visualized. Left Kidney: Renal measurements: 12.7 x 6.8 x 5.5 cm = volume: 248 mL. Echogenicity within normal limits. No mass or hydronephrosis visualized. Bladder: Appears normal for degree of bladder distention. Other: None. IMPRESSION: No acute findings.  No hydronephrosis. Electronically Signed   By: Charlett Nose M.D.   On: 03/16/2023 22:14   CT Chest W Contrast  Result Date: 03/13/2023 CLINICAL DATA:  Chest wall pain, nontraumatic, infection or inflammation suspected. X-ray done EXAM: CT CHEST WITH CONTRAST TECHNIQUE: Multidetector CT imaging of the chest was performed during intravenous contrast administration. RADIATION DOSE REDUCTION: This exam was performed according to the departmental dose-optimization program which includes automated exposure control, adjustment of the mA and/or kV according to patient size and/or use of iterative reconstruction technique. CONTRAST:  50mL OMNIPAQUE IOHEXOL 350 MG/ML SOLN COMPARISON:  Chest radiographs 03/13/2023 FINDINGS: Cardiovascular: Normal heart size. No pericardial effusion. Coronary artery atherosclerotic calcification. Mediastinum/Nodes: Trachea and esophagus are unremarkable. No thoracic adenopathy. Lungs/Pleura: Lungs are clear. No pleural effusion or pneumothorax. 9 mm left lower lobe pulmonary nodule (4/99) is stable since 03/18/2022. Upper Abdomen: No acute abnormality. Musculoskeletal: No acute fracture or destructive osseous lesion. Bilateral gynecomastia. Subcutaneous fat stranding in the midline back at the level of T9-T10 and extending inferiorly. No drainable fluid collection or abscess  IMPRESSION: 1. Nonspecific subcutaneous fat stranding in the midline back at the level of T9-T10 and extending inferiorly. Correlate for cellulitis or contusion. 2. 9 mm left lower lobe pulmonary nodule, stable since 03/18/2022. Follow-up in 6-12 months (from today's scan) is considered optional for low risk patients, but is recommended for high-risk patients. This recommendation follows the consensus statement: Guidelines for Management of Incidental Pulmonary Nodules Detected on CT Images: From the Fleischner Society 2017; Radiology 2017; 284:228-243. Electronically Signed   By: Minerva Fester M.D.   On: 03/13/2023 22:46   DG Chest 2 View  Result Date: 03/13/2023 CLINICAL DATA:  Chest pain EXAM: CHEST - 2 VIEW COMPARISON:  None Available. FINDINGS: Cardiomegaly. Low lung volumes with  hypoventilatory changes. No focal consolidation. No evidence of pleural effusion or pneumothorax. IMPRESSION: Low lung volumes with hypoventilatory changes. No focal consolidation. Electronically Signed   By: Allegra Lai M.D.   On: 03/13/2023 18:21   DG Thoracic Spine 2 View  Result Date: 03/12/2023 CLINICAL DATA:  Back wound EXAM: THORACIC SPINE 2 VIEWS COMPARISON:  None Available. FINDINGS: There is no evidence of thoracic spine fracture. Alignment is normal. No other significant bone abnormalities are identified. IMPRESSION: Negative. Electronically Signed   By: Helyn Numbers M.D.   On: 03/12/2023 01:32    Microbiology: No results found for this or any previous visit (from the past 240 hour(s)).   Labs: Basic Metabolic Panel: Recent Labs  Lab 03/18/23 0349 03/19/23 0452 03/20/23 0716 03/21/23 0911 03/22/23 0025  NA 133* 132* 137 137 138  K 3.7 3.4* 3.6 3.4* 4.1  CL 96* 94* 96* 97* 98  CO2 24 25 28 28 27   GLUCOSE 203* 290* 263* 274* 309*  BUN 57* 56* 50* 44* 43*  CREATININE 4.12* 3.13* 2.64* 2.40* 2.23*  CALCIUM 8.6* 8.8* 9.0 8.9 8.9  PHOS  --   --   --  3.7 3.8    Liver Function Tests: Recent  Labs  Lab 03/20/23 0716 03/21/23 0911 03/22/23 0025  AST 17  --   --   ALT 24  --   --   ALKPHOS 157*  --   --   BILITOT 0.5  --   --   PROT 7.3  --   --   ALBUMIN 2.6* 2.5* 2.5*    No results for input(s): "LIPASE", "AMYLASE" in the last 168 hours. No results for input(s): "AMMONIA" in the last 168 hours. CBC: Recent Labs  Lab 03/17/23 0415 03/18/23 0349 03/19/23 0452 03/20/23 0716 03/22/23 0025  WBC 10.6* 10.1 10.5 11.5* 8.0  HGB 9.0* 10.3* 12.3* 12.6* 12.1*  HCT 26.9* 30.3* 36.0* 38.0* 36.5*  MCV 88.5 86.8 86.1 88.2 89.2  PLT 262 336 398 385 382    Cardiac Enzymes: No results for input(s): "CKTOTAL", "CKMB", "CKMBINDEX", "TROPONINI" in the last 168 hours. BNP: BNP (last 3 results) Recent Labs    01/18/23 0641 02/08/23 1446 03/13/23 1630  BNP 141.8* 101.4* 293.2*     ProBNP (last 3 results) No results for input(s): "PROBNP" in the last 8760 hours.  CBG: Recent Labs  Lab 03/21/23 0759 03/21/23 1156 03/21/23 1801 03/21/23 2026 03/22/23 0818  GLUCAP 220* 246* 218* 289* 201*        Signed:  Rhetta Mura MD   [facility] 03/22/2023, 10:11 AM

## 2023-03-22 NOTE — Progress Notes (Signed)
Patient ID: Leroy Simmons, male   DOB: [birth year], 52 y.o.   MRN: [id number] [facility] Progress Note   Assessment/ Plan:   1. Acute kidney Injury on chronic kidney disease stage IIIa: Nonoliguric. Urine studies significant for proteinuria (likely related to his diabetes mellitus) and without any indication for GN-workup so far. Appears to favor ATN and is resolving.   - Renal function is improving. - We have transitioned to his recent home regimen of torsemide 40 mg BID   - I have reordered daily weights and strict ins/outs.  Reinforced the need for same; appreciate nursing  - I have asked that his legs be wrapped  2.  Sepsis secondary to abscess/cellulitis of mid back:  Status post incision and drainage.  Abx per primary team and ID.  Note that surgery and ID were consulted.   3.  Hypertension: optimize volume status as above.  Adding TED hose. Goal to add back lisinopril   - Metolazone 5 mg PO once on 6/19  4.  Hyponatremia: Resolved.  Secondary to acute kidney injury and impaired free water handling/unrestricted intake.  Continue fluid restriction of 1.2 L/day   5. CKD stage 3a - baseline Cr appears 1.5 - 1.8.  He is approaching his baseline  6.  Proteinuria - likely 2/2 DM in the setting of A1c 11.0 in 01/2023.  Noted up/cr ratio this admission 6970 mg/g.  In 01/2023 up/cr ratio was 5200 mg/g.  In 02/2023, note he had additional labs sent - ANCA was negative. ANA was positive.   - Check SPEP, random UPEP, and free light chains   7.  Hypokalemia: Secondary to diuretic induced losses; replete orally PRN. Improved  8. ANA positive - outpatient rheum referral if he has not already had this  9.  Insulin-dependent type 2 DM with CKD - per primary team  Disposition - Resolving AKI and now on PO diuretics.  Will set up for routine nephrology follow-up on discharge    Subjective:   He had 2.0 liters UOP over 6/18.  In the past we have struggled with strict ins/outs.  His legs  were wrapped yesterday but they apparently just wrapped his knee caps.  Spoke with nursing and staff yesterday had misunderstood the order to wrap legs - she suggested TED hose order instead         Review of systems:  He reports that he had shortness of breath when he has pain Has had some nausea without vomiting; he wonders if related to a medication  Ambulating in room  Slept ok per nursing   Objective:   BP (!) 168/90 (BP Location: Left Arm)   Pulse 82   Temp 98.1 F (36.7 C)   Resp 16   Ht 5' 7.5" (1.715 m)   Wt 104.5 kg   SpO2 99%   BMI 35.55 kg/m   Intake/Output Summary (Last 24 hours) at 03/22/2023 0602 Last data filed at 03/22/2023 0551 Gross per 24 hour  Intake 1050 ml  Output 2025 ml  Net -975 ml   Weight change:   Physical Exam:     General adult male in bed in no acute distress HEENT normocephalic atraumatic extraocular movements intact sclera anicteric Neck supple trachea midline Lungs clear to auscultation bilaterally normal work of breathing at rest on room air  Heart S1S2 no rub Abdomen soft nontender nondistended Extremities 1-2+ edema; I removed ACE wraps (these were just around his kneecaps)  Psych normal mood and affect Neuro -  alert and oriented x 3 provides hx and follows commands   Imaging: No results found.  Labs: BMET Recent Labs  Lab 03/16/23 0423 03/17/23 0415 03/18/23 0349 03/19/23 0452 03/20/23 0716 03/21/23 0911 03/22/23 0025  NA 130* 133* 133* 132* 137 137 138  K 3.5 3.5 3.7 3.4* 3.6 3.4* 4.1  CL 95* 97* 96* 94* 96* 97* 98  CO2 25 25 24 25 28 28 27   GLUCOSE 276* 164* 203* 290* 263* 274* 309*  BUN 40* 55* 57* 56* 50* 44* 43*  CREATININE 3.91* 5.28* 4.12* 3.13* 2.64* 2.40* 2.23*  CALCIUM 8.2* 8.2* 8.6* 8.8* 9.0 8.9 8.9  PHOS  --   --   --   --   --  3.7 3.8   CBC Recent Labs  Lab 03/18/23 0349 03/19/23 0452 03/20/23 0716 03/22/23 0025  WBC 10.1 10.5 11.5* 8.0  HGB 10.3* 12.3* 12.6* 12.1*  HCT 30.3* 36.0* 38.0* 36.5*   MCV 86.8 86.1 88.2 89.2  PLT 336 398 385 382    Medications:     amLODipine  10 mg Oral Daily   atorvastatin  80 mg Oral Daily   enoxaparin (LOVENOX) injection  100 mg Subcutaneous Q12H   insulin aspart  0-15 Units Subcutaneous TID WC   insulin aspart  4 Units Subcutaneous TID WC   insulin glargine-yfgn  22 Units Subcutaneous Daily   insulin glargine-yfgn  22 Units Subcutaneous QHS   isosorbide mononitrate  30 mg Oral Daily   linezolid  600 mg Oral Q12H   metoprolol succinate  50 mg Oral Daily   polyethylene glycol  17 g Oral BID   sodium chloride flush  3 mL Intravenous Q12H   torsemide  40 mg Oral BID   Estanislado Emms, MD 03/22/2023, 6:34 AM

## 2023-03-22 NOTE — Inpatient Diabetes Management (Signed)
Inpatient Diabetes Program Recommendations  AACE/ADA: New Consensus Statement on Inpatient Glycemic Control (2015)  Target Ranges:  Prepandial:   less than 140 mg/dL      Peak postprandial:   less than 180 mg/dL (1-2 hours)      Critically ill patients:  140 - 180 mg/dL   Lab Results  Component Value Date   GLUCAP 201 (H) 03/22/2023   HGBA1C 12.4 (A) 02/09/2023    Review of Glycemic Control  Latest Reference Range & Units 03/20/23 21:09 03/21/23 07:59 03/21/23 11:56 03/21/23 18:01 03/21/23 20:26 03/22/23 08:18  Glucose-Capillary 70 - 99 mg/dL 621 (H) 308 (H) 657 (H) 218 (H) 289 (H) 201 (H)   Diabetes history: DM  Outpatient Diabetes medications:  Lantus 34 units daily (this was how it was ordered but patient admits to taking Lantus 5 units tid with meals) Humalog kwikpen 5 units tid with meals (Patient was taking Humalog 34 units once a day) Current orders for Inpatient glycemic control:  Novolog 0-15 units tid with meals  Novolog 4 units tid with meals Semglee 22 units q HS  Inpatient Diabetes Program Recommendations:    Note plans for discharge.  I met with patient and his friend at bedside.  He had insulin pens in his bag so I was able to review use of both and clarified with him that the Humalog 5 units (yellow pen) is to be given with meals (short acting) and Lantus (gray pen) is to be given as 22 units bid (long acting).  Reviewed with both patient and his friend.  We also reviewed the dangers of him taking insulin without checking blood sugars and discussed hypoglycemia signs, symptoms and treatment. Encouraged him to check blood sugars at least 3 times a day.  Briefly discussed the plate method and the importance of exercise as well. He requests follow-up with dietician regarding DM as well. Patient appreciative of information.   Thanks,  Beryl Meager, RN, BC-ADM Inpatient Diabetes Coordinator Pager [phone]  (8a-5p)

## 2023-03-22 NOTE — TOC Progression Note (Signed)
Transition of Care [facility]) - Progression Note    Patient Details  Name: Leroy Simmons MRN: [id number] Date of Birth: 18-Jul-1971  Transition of Care [facility]) CM/SW Contact  Janae Bridgeman, RN Phone Number: 03/22/2023, 11:41 AM  Clinical Narrative:    CM met with the patient at the bedside prior to discharge to home today.  The patient is aware that he is receiving glucometer and supplies from [facility] pharmacy before discharging home.  The patient states that he is uncertain how to use a glucometer.  The bedside nurse was notified and she will provide the patient with thorough instructions regarding diabetes management.  Resources provided in the AVS as well.  The patient states that his suite-mate at the boarding house is coming to the hospital.  He plans [place] the [place] bus [place].  4 bus passes were provided to the patient for transportation assistance.  Resources provided in the AVS as well.   Expected Discharge Plan: Home/Self Care Barriers to Discharge: No Barriers Identified  Expected Discharge Plan and Services     Post Acute Care Choice: Resumption of Svcs/PTA Provider Living arrangements for the past 2 months: Boarding House Expected Discharge Date: 03/22/23               DME Arranged:  (Glucometer and supplies are being provided through [facility] pharmacy)                     Social Determinants of Health (SDOH) Interventions SDOH Screenings   Food Insecurity: No Food Insecurity (01/19/2023)  Housing: Low Risk  (01/20/2023)  Transportation Needs: No Transportation Needs (01/20/2023)  Utilities: Not At Risk (01/19/2023)  Alcohol Screen: Low Risk  (01/20/2023)  Depression (PHQ2-9): High Risk (02/09/2023)  Financial Resource Strain: Low Risk  (01/20/2023)  Tobacco Use: Low Risk  (03/13/2023)    Readmission Risk Interventions    03/22/2023   11:38 AM 01/23/2023    3:20 PM 09/27/2022   12:36 PM  Readmission Risk Prevention Plan  Transportation Screening Complete Complete  Complete  PCP or Specialist Appt within 3-5 Days  Complete Complete  Palliative Care Screening  Not Applicable Not Applicable  Medication Review (RN Care Manager) Complete Complete Complete  PCP or Specialist appointment within 3-5 days of discharge Complete    HRI or Home Care Consult Complete    SW Recovery Care/Counseling Consult Complete    Palliative Care Screening Not Applicable    Skilled Nursing Facility Not Applicable

## 2023-03-22 NOTE — Consult Note (Signed)
   [facility] CM Inpatient Consult   03/22/2023  [name] 03-20-1971 [id number]  Follow up:  Community Care Coordination  [facility] [[facility]]  Accountable Care Organization [ACO] Patient:  Leroy Simmons  Primary Care Provider:  [place], Buren Kos, MD, [place] at [facility] and HIPAA verified spoke with patient at phone to explain [facility] Care Management services.  Patient will receive post hospital discharge call and for assessments fo community needs for care/disease management.  Patient expresses needs for Food resources for follow up with Food Stamps and diabetes community care coordination management support, A1C 11   Spoke with Inpatient Transition of Care [TOC] RN Case Manager aware that [facility] Care Management following.   Of note, [facility] Care Management services does not replace or interfere with any services that are arranged by inpatient Transition of Care [TOC] team     For additional questions or referrals please contact:    Charlesetta Shanks, RN BSN CCM [place] [facility]  [phone] business mobile phone Toll free office [phone]  *Concierge Line  [phone] Fax number: [phone] [name]

## 2023-03-22 NOTE — Discharge Summary (Deleted)
Physician Discharge Summary  Leroy Simmons YNW:[id number] DOB: Mar 24, 1971 DOA: 03/13/2023  PCP: de [place], Raymond J, MD  Admit date: 03/13/2023 Discharge date: 03/22/2023  Time spent: 37 minutes  Recommendations for Outpatient Follow-up:  Complete Zyvox in the outpatient setting as per ID Requires home health nursing to supervise medications (patient was taking insulins incorrectly, was on both atorvastatin + Crestor etc. etc.) Suggest CBC Chem-7 1 week's time PCP to determine if Eliquis should be continued-patient has history of LLE DVT that is not characterized well in the note in terms of how long he has been on blood thinner Follow nephrology labs SPEP, light chains and will need outpatient referral to nephrology Please consider referral to rheumatology/repeat ANA titers in the outpatient setting as per PCP  Discharge Diagnoses:  MAIN problem for hospitalization   Infection of upper back with cellulitis HFpEF HTN nonobstructive CAD previously AKI this hospital stay with resolution LLE DVT supposed to be on Eliquis 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  ANA positive Pulmonary nodule followed in the outpatient by pulmonology   Please see below for itemized issues addressed in HOpsital- refer to other progress notes for clarity if needed  Discharge Condition: Improved  Diet recommendation: Diabetic heart healthy  Filed Weights   03/13/23 1626 03/19/23 0733 03/22/23 0600  Weight: 104.3 kg 104.5 kg 98 kg    History of present illness:  52 year old white male (previously staying at a boardinghouse) HF PEF EF 55-60% 03/17/2022-left heart cath 6/19 = nonobstructive CAD 50% stenosis Gangrenous appendicitis 09/2022 + peritoneal abscess + lap appendectomy abscess drain placement 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 Malen Gauze in the outpatient-CC at discharge time Wrapped lower extremities as able at discharge  Left lower extremity peroneal veins DVT 01/2023 Resume Eliquis-appears to not have been taking in the outpatient setting-coupon may be needed-sent to [facility] pharmacy  Stable asthma LLL pulmonary nodule 9 mm-outpatient follow-up with Dr. Delton Coombes of pulmonology as per their protocol will CC  Nonobstructive CAD 03/2022 based on cath Poorly controlled HTN HF PEF stable without any acute component on hospitalization Had some chest pain during hospital stay but did not seem cardiac--meds were titrated during hospital stay and changed around-he is noncompliant on several  Poorly controlled DM TY 2 A1c 08/2023 Discontinue forever  metformin, continue  sliding scale coverage at home and lowered dose of long-acting   Discharge Exam: Vitals:   03/22/23 0912 03/22/23 0912  BP: (!) 141/88 (!) 141/88  Pulse: 90 90  Resp: 20 20  Temp: 98.2 F (36.8 C) 98.2 F (36.8 C)  SpO2: 98% 99%    Subj on day of d/c   Awake coherent no distress no icterus no pallor no wheeze no rales no rhonchi Back exam as above per picture Chest is clear S1-S2 no murmur Abdomen soft no rebound No lower extremity edema Psych flat disheveled affect  Discharge Instructions   Discharge Instructions     Change dressing (specify)   Complete by: As directed    Dressing change: Once times per day using nonocclusive dressing until it is all healed up.   Diet - low sodium heart healthy   Complete by: As directed    Discharge instructions   Complete by: As directed    Make sure that you complete all the antibiotics for the infection on your back We will get a home health nurse to make sure that you are taking your medications carefully and that you do not miss doses or confused your insulin says that sometimes can occur with even the best people You will notice that we have stopped various medications including lisinopril Bactrim and Lasix as well as blood pressure medications called hydralazine and hydroxyzine-please do not take them We have added amlodipine 10 mg and adjusted downwards your Lantus Please take your short acting insulin which is a different insulin at the dose prescribed You will get a limited amount of baclofen and oxycodone for your back and eventually you can come off these medications Please follow-up with your primary physician in about 1 week's time--- if you experience fever chills nausea vomiting back pain oozing dizziness shortness of breath please seek medical attention   Increase activity slowly   Complete by: As directed       Allergies as of 03/22/2023   No Known Allergies      Medication List     STOP  taking these medications    furosemide 20 MG tablet Commonly known as: LASIX   hydrALAZINE 50 MG tablet Commonly known as: APRESOLINE   hydrOXYzine 25 MG capsule Commonly known as: VISTARIL   lisinopril 20 MG tablet Commonly known as: ZESTRIL   rosuvastatin 20 MG tablet Commonly known as: CRESTOR   sulfamethoxazole-trimethoprim 800-160 MG tablet Commonly known as: BACTRIM DS       TAKE these medications    Admelog SoloStar 100 UNIT/ML KwikPen Generic drug: insulin lispro Inject 5 Units into the skin 3 (three) times daily before meals. What changed:  how much to take when to take this   albuterol 108 (90 Base) MCG/ACT inhaler Commonly known as: VENTOLIN HFA Inhale 2 puffs into the lungs every 6 (six) hours as needed for wheezing or shortness of breath.   amLODipine 10 MG tablet Commonly known as: NORVASC Take 1 tablet (10 mg total) by mouth daily. What changed:  medication strength how much to take   apixaban 5 MG Tabs tablet Commonly known as: ELIQUIS Take 2 tablets twice daily until 01/27/23, then continue with one tablet twice daily starting on 01/28/23. What changed:  how much to take how to take this when to take this additional instructions   aspirin EC 81 MG tablet Take 81 mg by mouth daily.   atorvastatin 80 MG tablet Commonly known as: LIPITOR Take 1 tablet (80 mg total)  by mouth daily.   baclofen 10 MG tablet Commonly known as: LIORESAL Take 1 tablet (10 mg total) by mouth 2 (two) times daily as needed for muscle spasms.   blood glucose meter kit and supplies Dispense based on patient and insurance preference. Use up to four times daily as directed. (FOR ICD-10 E10.9, E11.9).   busPIRone 5 MG tablet Commonly known as: BUSPAR Take 1 tablet (5 mg total) by mouth 2 (two) times daily as needed (anxiety). What changed: when to take this   insulin glargine 100 unit/mL Sopn Commonly known as: LANTUS Inject 22 Units into the skin daily. What  changed: how much to take   isosorbide mononitrate 30 MG 24 hr tablet Commonly known as: IMDUR Take 1 tablet (30 mg total) by mouth daily.   linezolid 600 MG tablet Commonly known as: ZYVOX Take 1 tablet (600 mg total) by mouth 2 (two) times daily for 9 days.   metoprolol succinate 50 MG 24 hr tablet Commonly known as: TOPROL-XL Take 50 mg by mouth daily. Take with or immediately following a meal.   oxyCODONE 5 MG immediate release tablet Commonly known as: Oxy IR/ROXICODONE Take 1 tablet (5 mg total) by mouth every 4 (four) hours as needed for moderate pain.   polyethylene glycol 17 g packet Commonly known as: MIRALAX / GLYCOLAX Take 17 g by mouth 2 (two) times daily.   prednisoLONE acetate 1 % ophthalmic suspension Commonly known as: PRED FORTE Place 1 drop into the left eye daily. Left eye: 1 drop 4 times daily for 7 days, 1 drop 3 times daily for 7 days, 1 drop 2 times daily for 7 days, 1 drop once daily for 7 days, stop.   senna-docusate 8.6-50 MG tablet Commonly known as: Senokot-S Take 1 tablet by mouth 2 (two) times daily.   torsemide 20 MG tablet Commonly known as: DEMADEX Take 2 tablets (40 mg total) by mouth 2 (two) times daily.               Discharge Care Instructions  (From admission, onward)           Start     Ordered   03/22/23 0000  Change dressing (specify)       Comments: Dressing change: Once times per day using nonocclusive dressing until it is all healed up.   03/22/23 0935           No Known Allergies  Follow-up Information     [place], Buren Kos, MD. Schedule an appointment as soon as possible for a visit.   Specialty: Family Medicine Why: Call the office and schedule a hospital follow up in the next 7-10 days. Contact information: [address] [place] [address] [phone]         [facility] for Infectious Disease Follow up.   Specialty: Infectious Diseases Why: As needed for PrEP - call  to set up an appointment in our clinic if interested Contact information: [address] [place], [place] [address] [id number] mc [place] [address] [phone]                 The results of significant diagnostics from this hospitalization (including imaging, microbiology, ancillary and laboratory) are listed below for reference.    Significant Diagnostic Studies: CT CHEST WO CONTRAST  Result Date: 03/19/2023 CLINICAL DATA:  Follow-up back wound, initial encounter EXAM: CT CHEST WITHOUT CONTRAST TECHNIQUE: Multidetector CT imaging of the chest was performed following the standard protocol without IV  contrast. RADIATION DOSE REDUCTION: This exam was performed according to the departmental dose-optimization program which includes automated exposure control, adjustment of the mA and/or kV according to patient size and/or use of iterative reconstruction technique. COMPARISON:  03/13/2023 FINDINGS: Cardiovascular: Somewhat limited due to lack of IV contrast. No aneurysmal dilatation of the aorta is noted. Coronary calcifications are seen. Heart is mildly enlarged in size. Mediastinum/Nodes: Thoracic inlet is within normal limits. No hilar or mediastinal adenopathy is noted. The esophagus as visualized is within normal limits. Lungs/Pleura: Lungs are well aerated bilaterally. No focal infiltrate or effusion is seen. No parenchymal nodules are noted. Upper Abdomen: Visualized upper abdomen is within normal limits. Musculoskeletal: Persistent subcutaneous edema and skin thickening is noted in the midportion of the back again in the region of T9-T10. No definitive abscess is seen. No new focal abnormality is noted. IMPRESSION: Stable changes in the midline of the lower chest wall again likely related to cellulitis. No discrete abscess is noted. No other focal abnormality is noted. Electronically Signed   By: Alcide Clever M.D.   On: 03/19/2023 21:13   US RENAL  Result Date:  03/16/2023 CLINICAL DATA:  Acute kidney injury EXAM: RENAL / URINARY TRACT ULTRASOUND COMPLETE COMPARISON:  01/18/2023 FINDINGS: Right Kidney: Renal measurements: 11.3 x 6.1 x 7.3 cm = volume: 263 mL. Echogenicity within normal limits. No mass or hydronephrosis visualized. Left Kidney: Renal measurements: 12.7 x 6.8 x 5.5 cm = volume: 248 mL. Echogenicity within normal limits. No mass or hydronephrosis visualized. Bladder: Appears normal for degree of bladder distention. Other: None. IMPRESSION: No acute findings.  No hydronephrosis. Electronically Signed   By: Charlett Nose M.D.   On: 03/16/2023 22:14   CT Chest W Contrast  Result Date: 03/13/2023 CLINICAL DATA:  Chest wall pain, nontraumatic, infection or inflammation suspected. X-ray done EXAM: CT CHEST WITH CONTRAST TECHNIQUE: Multidetector CT imaging of the chest was performed during intravenous contrast administration. RADIATION DOSE REDUCTION: This exam was performed according to the departmental dose-optimization program which includes automated exposure control, adjustment of the mA and/or kV according to patient size and/or use of iterative reconstruction technique. CONTRAST:  50mL OMNIPAQUE IOHEXOL 350 MG/ML SOLN COMPARISON:  Chest radiographs 03/13/2023 FINDINGS: Cardiovascular: Normal heart size. No pericardial effusion. Coronary artery atherosclerotic calcification. Mediastinum/Nodes: Trachea and esophagus are unremarkable. No thoracic adenopathy. Lungs/Pleura: Lungs are clear. No pleural effusion or pneumothorax. 9 mm left lower lobe pulmonary nodule (4/99) is stable since 03/18/2022. Upper Abdomen: No acute abnormality. Musculoskeletal: No acute fracture or destructive osseous lesion. Bilateral gynecomastia. Subcutaneous fat stranding in the midline back at the level of T9-T10 and extending inferiorly. No drainable fluid collection or abscess IMPRESSION: 1. Nonspecific subcutaneous fat stranding in the midline back at the level of T9-T10 and  extending inferiorly. Correlate for cellulitis or contusion. 2. 9 mm left lower lobe pulmonary nodule, stable since 03/18/2022. Follow-up in 6-12 months (from today's scan) is considered optional for low risk patients, but is recommended for high-risk patients. This recommendation follows the consensus statement: Guidelines for Management of Incidental Pulmonary Nodules Detected on CT Images: From the Fleischner Society 2017; Radiology 2017; 284:228-243. Electronically Signed   By: Minerva Fester M.D.   On: 03/13/2023 22:46   DG Chest 2 View  Result Date: 03/13/2023 CLINICAL DATA:  Chest pain EXAM: CHEST - 2 VIEW COMPARISON:  None Available. FINDINGS: Cardiomegaly. Low lung volumes with hypoventilatory changes. No focal consolidation. No evidence of pleural effusion or pneumothorax. IMPRESSION: Low lung volumes with hypoventilatory changes.  No focal consolidation. Electronically Signed   By: Allegra Lai M.D.   On: 03/13/2023 18:21   DG Thoracic Spine 2 View  Result Date: 03/12/2023 CLINICAL DATA:  Back wound EXAM: THORACIC SPINE 2 VIEWS COMPARISON:  None Available. FINDINGS: There is no evidence of thoracic spine fracture. Alignment is normal. No other significant bone abnormalities are identified. IMPRESSION: Negative. Electronically Signed   By: Helyn Numbers M.D.   On: 03/12/2023 01:32    Microbiology: No results found for this or any previous visit (from the past 240 hour(s)).   Labs: Basic Metabolic Panel: Recent Labs  Lab 03/18/23 0349 03/19/23 0452 03/20/23 0716 03/21/23 0911 03/22/23 0025  NA 133* 132* 137 137 138  K 3.7 3.4* 3.6 3.4* 4.1  CL 96* 94* 96* 97* 98  CO2 24 25 28 28 27   GLUCOSE 203* 290* 263* 274* 309*  BUN 57* 56* 50* 44* 43*  CREATININE 4.12* 3.13* 2.64* 2.40* 2.23*  CALCIUM 8.6* 8.8* 9.0 8.9 8.9  PHOS  --   --   --  3.7 3.8   Liver Function Tests: Recent Labs  Lab 03/20/23 0716 03/21/23 0911 03/22/23 0025  AST 17  --   --   ALT 24  --   --    ALKPHOS 157*  --   --   BILITOT 0.5  --   --   PROT 7.3  --   --   ALBUMIN 2.6* 2.5* 2.5*   No results for input(s): "LIPASE", "AMYLASE" in the last 168 hours. No results for input(s): "AMMONIA" in the last 168 hours. CBC: Recent Labs  Lab 03/17/23 0415 03/18/23 0349 03/19/23 0452 03/20/23 0716 03/22/23 0025  WBC 10.6* 10.1 10.5 11.5* 8.0  HGB 9.0* 10.3* 12.3* 12.6* 12.1*  HCT 26.9* 30.3* 36.0* 38.0* 36.5*  MCV 88.5 86.8 86.1 88.2 89.2  PLT 262 336 398 385 382   Cardiac Enzymes: No results for input(s): "CKTOTAL", "CKMB", "CKMBINDEX", "TROPONINI" in the last 168 hours. BNP: BNP (last 3 results) Recent Labs    01/18/23 0641 02/08/23 1446 03/13/23 1630  BNP 141.8* 101.4* 293.2*    ProBNP (last 3 results) No results for input(s): "PROBNP" in the last 8760 hours.  CBG: Recent Labs  Lab 03/21/23 0759 03/21/23 1156 03/21/23 1801 03/21/23 2026 03/22/23 0818  GLUCAP 220* 246* 218* 289* 201*       Signed:  Rhetta Mura MD   [facility] 03/22/2023, 9:36 AM

## 2023-03-22 NOTE — Progress Notes (Signed)
Delivered patient discharge meds to [facility] unit Flow nurse.  Checked with [facility] pharmacy about additional discharge meds that were prescribed by MD. Currently working on those meds.  Left message with [facility] unit staff to make flow nurse aware prior to discharging the patient.  Orvan Seen SWOT RN

## 2023-03-23 ENCOUNTER — Telehealth: Payer: Self-pay

## 2023-03-23 LAB — KAPPA/LAMBDA LIGHT CHAINS
Kappa free light chain: 65.1 mg/L — ABNORMAL HIGH (ref 3.3–19.4)
Kappa, lambda light chain ratio: 1.07 (ref 0.26–1.65)
Lambda free light chains: 61.1 mg/L — ABNORMAL HIGH (ref 5.7–26.3)

## 2023-03-23 NOTE — Transitions of Care (Post Inpatient/ED Visit) (Signed)
03/23/2023  Name: Leroy Simmons MRN: [id number] DOB: [birth year]  Today's TOC FU Call Status: Today's TOC FU Call Status:: Successful TOC FU Call Competed TOC FU Call Complete Date: 03/23/23  Transition Care Management Follow-up Telephone Call Date of Discharge: 03/22/23 Discharge Facility: [facility]) Type of Discharge: Inpatient Admission Primary Inpatient Discharge Diagnosis:: "cellulitis of back except buttock" How have you been since you were released from the hospital?: Better (Brief call with pt-as he voices he is "still resting-wants to go back to sleep." Denies any pain or issues since returning home. States he feels like his "back is looking better.") Any questions or concerns?: No  Items Reviewed: Did you receive and understand the discharge instructions provided?: Yes Medications obtained,verified, and reconciled?: Partial Review Completed Reason for Partial Mediation Review: pt sleepy during call-didnot want to complete full med review Any new allergies since your discharge?: No Dietary orders reviewed?: Yes Type of Diet Ordered:: low salt/heart healthy/carb modified Do you have support at home?: Yes Name of Support/Comfort Primary Source: lives with a 'friend"  Medications Reviewed Today: Medications Reviewed Today     Reviewed by Charlyn Minerva, RN (Registered Nurse) on 03/23/23 at 1115  Med List Status: <None>   Medication Order Taking? Sig Documenting Provider Last Dose Status Informant  albuterol (VENTOLIN HFA) 108 (90 Base) MCG/ACT inhaler [id number]  Inhale 2 puffs into the lungs every 6 (six) hours as needed for wheezing or shortness of breath. Leslye Peer, MD  Active Self, Pharmacy Records  amLODipine (NORVASC) 10 MG tablet [id number] Yes Take 1 tablet (10 mg total) by mouth daily. Rhetta Mura, MD Taking Active   apixaban (ELIQUIS) 5 MG TABS tablet [id number]  Take 1 tablet (5mg ) by mouth twice daily. Rhetta Mura, MD  Active    aspirin EC 81 MG tablet [id number]  Take 81 mg by mouth daily. [provider]  Active Self, Pharmacy Records  atorvastatin (LIPITOR) 80 MG tablet [id number]  Take 1 tablet (80 mg total) by mouth daily. Arrien, York Ram, MD  Active Self, Pharmacy Records           Med Note (COFFELL, Marzella Schlein   Tue Mar 14, 2023  4:02 PM) Pt is currently taking BOTH atorvastatin and rosuvastatin (both are in his medication bag).  baclofen (LIORESAL) 10 MG tablet [id number] Yes Take 1 tablet (10 mg total) by mouth 2 (two) times daily as needed for muscle spasms. Rhetta Mura, MD Taking Active   blood glucose meter kit and supplies [id number]  Dispense based on patient and insurance preference. Use up to four times daily as directed. (FOR ICD-10 E10.9, E11.9). de [place], Raymond J, MD  Active Self, Pharmacy Records  busPIRone (BUSPAR) 5 MG tablet [id number]  Take 1 tablet (5 mg total) by mouth 2 (two) times daily as needed (anxiety). Rhetta Mura, MD  Active   insulin glargine (LANTUS) 100 unit/mL SOPN [id number] Yes Inject 22 Units into the skin 2 (two) times daily. Rhetta Mura, MD Taking Active   insulin lispro (HUMALOG KWIKPEN) 100 UNIT/ML KwikPen [id number]  Inject 5 Units into the skin 3 (three) times daily before meals.  Patient taking differently: Inject 34 Units into the skin daily.   Grayce Sessions, NP  Active Self, Pharmacy Records  isosorbide mononitrate (IMDUR) 30 MG 24 hr tablet [id number]  Take 1 tablet (30 mg total) by mouth daily.  Patient not taking: Reported on 03/14/2023   Arrien, York Ram, MD  Active Self, Pharmacy Records  Med Note (COFFELL, Marzella Schlein   Tue Mar 14, 2023  4:05 PM) Pt does not have in his medication bag, said if it's not in the bag he doesn't think he is taking it.  linezolid (ZYVOX) 600 MG tablet [id number] Yes Take 1 tablet (600 mg total) by mouth 2 (two) times daily for 9 days. Danelle Earthly, MD Taking Active   metoprolol  succinate (TOPROL-XL) 50 MG 24 hr tablet [id number]  Take 50 mg by mouth daily. Take with or immediately following a meal. [provider]  Active Self, Pharmacy Records           Med Note (COFFELL, Marzella Schlein   Tue Mar 14, 2023  4:05 PM) Unable to verify an exact last dose time with pt.  oxyCODONE (OXY IR/ROXICODONE) 5 MG immediate release tablet [id number] Yes Take 1 tablet (5 mg total) by mouth every 4 (four) hours as needed for moderate pain. Rhetta Mura, MD Taking Active   polyethylene glycol powder (GLYCOLAX/MIRALAX) 17 GM/SCOOP powder [id number] Yes Take 17 g by mouth 2 (two) times daily. Rhetta Mura, MD Taking Active   prednisoLONE acetate (PRED FORTE) 1 % ophthalmic suspension [id number]  Place 1 drop into the left eye daily. Left eye: 1 drop 4 times daily for 7 days, 1 drop 3 times daily for 7 days, 1 drop 2 times daily for 7 days, 1 drop once daily for 7 days, stop. [provider]  Active Self, Pharmacy Records           Med Note (COFFELL, Marzella Schlein   Tue Mar 14, 2023  4:09 PM) Pt states he has been taking 1 drop daily for "a few days"  senna-docusate (SENOKOT-S) 8.6-50 MG tablet [id number]  Take 1 tablet by mouth 2 (two) times daily. Rhetta Mura, MD  Active   torsemide (DEMADEX) 20 MG tablet [id number] Yes Take 2 tablets (40 mg total) by mouth 2 (two) times daily. Allayne Butcher, PA-C Taking Active Self, Pharmacy Records            Home Care and Equipment/Supplies: Were Home Health Services Ordered?: NA Any new equipment or medical supplies ordered?: NA  Functional Questionnaire: Do you need assistance with bathing/showering or dressing?: No Do you need assistance with meal preparation?: No Do you need assistance with eating?: No Do you have difficulty maintaining continence: No Do you need assistance with getting out of bed/getting out of a chair/moving?: No Do you have difficulty managing or taking your medications?: No  Follow up  appointments reviewed: PCP Follow-up appointment confirmed?: No MD Provider Line Number:[phone] Given:  (pt states he does not plan to return to [facility] for primary care(care team updated)prefers to continue seeing [facility]-saw her last month. Pt did not want to make appt during call-states he will call later.) Specialist Hospital Follow-up appointment confirmed?: No Reason Specialist Follow-Up Not Confirmed: Patient has Specialist Provider Number and will Call for Appointment (Reviewed with pt info on d/c summary in regards to following up with Infectious Disease MD.) Do you need transportation to your follow-up appointment?: No Do you understand care options if your condition(s) worsen?: Yes-patient verbalized understanding  SDOH Interventions Today    Flowsheet Row Most Recent Value  SDOH Interventions   Food Insecurity Interventions Patient Declined  Transportation Interventions Patient Declined      TOC Interventions Today    Flowsheet Row Most Recent Value  TOC Interventions   TOC Interventions Discussed/Reviewed TOC Interventions Discussed, S/S of infection  Interventions Today    Flowsheet Row Most Recent Value  Chronic Disease   Chronic disease during today's visit Diabetes  General Interventions   General Interventions Discussed/Reviewed General Interventions Discussed, Durable Medical Equipment (DME), Doctor Visits, Referral to Nurse  [follow up appt scheduled with assigned RN care coordinator]  Doctor Visits Discussed/Reviewed Doctor Visits Discussed, PCP, Specialist  Durable Medical Equipment (DME) Glucomoter  [Pt confirms he got meter-has not checked blood sugar yet-reviewed importance of monitoring cbgs in the home]  PCP/Specialist Visits Compliance with follow-up visit  Education Interventions   Education Provided Provided Education  Provided Verbal Education On Nutrition, When to see the doctor, Blood Sugar  Monitoring, Medication  Nutrition Interventions   Nutrition Discussed/Reviewed Nutrition Discussed, Decreasing sugar intake, Decreasing salt, Adding fruits and vegetables  Pharmacy Interventions   Pharmacy Dicussed/Reviewed Pharmacy Topics Discussed, Medications and their functions       Alessandra Grout [facility] Care Management Care Management Community Coordinator Direct Phone: [phone] Toll Free: [phone] Fax: [phone]

## 2023-03-24 LAB — PROTEIN ELECTROPHORESIS, SERUM
A/G Ratio: 0.7 (ref 0.7–1.7)
Albumin ELP: 2.8 g/dL — ABNORMAL LOW (ref 2.9–4.4)
Alpha-1-Globulin: 0.4 g/dL (ref 0.0–0.4)
Alpha-2-Globulin: 1.4 g/dL — ABNORMAL HIGH (ref 0.4–1.0)
Beta Globulin: 1.1 g/dL (ref 0.7–1.3)
Gamma Globulin: 1.2 g/dL (ref 0.4–1.8)
Globulin, Total: 4.1 g/dL — ABNORMAL HIGH (ref 2.2–3.9)
Total Protein ELP: 6.9 g/dL (ref 6.0–8.5)

## 2023-03-28 DIAGNOSIS — E113591 Type 2 diabetes mellitus with proliferative diabetic retinopathy without macular edema, right eye: Secondary | ICD-10-CM | POA: Diagnosis not present

## 2023-03-29 ENCOUNTER — Ambulatory Visit: Payer: Self-pay

## 2023-03-29 DIAGNOSIS — Z789 Other specified health status: Secondary | ICD-10-CM

## 2023-03-29 DIAGNOSIS — I5033 Acute on chronic diastolic (congestive) heart failure: Secondary | ICD-10-CM

## 2023-03-29 DIAGNOSIS — Z794 Long term (current) use of insulin: Secondary | ICD-10-CM

## 2023-03-29 NOTE — Patient Outreach (Signed)
  Care Coordination   Initial Visit Note   03/29/2023 Name: ELIYAH BAZZI MRN: [id number] DOB: 1971/03/02  Janace Hoard is a 52 y.o. year old male who sees Grayce Sessions, NP for primary care. I spoke with  Janace Hoard by phone today.  What matters to the patients health and wellness today?  Mr. Monica lives in a boarding house and is able to handle his activities of daily living (ADLs) and instrumental activities of daily living (IADLs). He mentioned having trouble with transportation, so I advised him to check with his insurance about that benefit. I also asked him to find out what type of glucometer his insurance prefers, as he currently has does not have a way to check his blood sugars. He expressed anxiety about his health due to the problems it has caused. Additionally, he receives 23 dollars in food stamps and is worried about accessing nutritious food. I'm initiating a referral to our care guides for Social Determinants of Health [facility]). Also sent the patient a message through my chart so he can reach through [facility].    Goals Addressed             This Visit's Progress    I want to keep my blood sugars under control       Patient Goals/Self Care Activities: -Patient/Caregiver will self-administer medications as prescribed as evidenced by self-report/primary caregiver report  -Patient/Caregiver will attend all scheduled provider appointments as evidenced by clinician review of documented attendance to scheduled appointments and patient/caregiver report -Patient/Caregiver will call pharmacy for medication refills as evidenced by patient report and review of pharmacy fill history as appropriate -Patient/Caregiver will call provider office for new concerns or questions as evidenced by review of documented incoming telephone call notes and patient report -Patient/Caregiver verbalizes understanding of plan -Patient/Caregiver will focus on medication adherence by taking  medications as prescribed  -check blood sugar at prescribed times -record values and write them down take them to all doctor visits   -When we get you a glucometer Active listening / Reflection utilized  Emotional Support Provided          SDOH assessments and interventions completed:  Yes  SDOH Interventions Today    Flowsheet Row Most Recent Value  SDOH Interventions   Food Insecurity Interventions Other (Comment)  [care guides referral]  Transportation Interventions Other (Comment)  [care guides]        Care Coordination Interventions:  Yes, provided   Follow up plan: Follow up call scheduled for 04/20/23  1130 am    Encounter Outcome:  Pt. Visit Completed   Juanell Fairly RN, BSN, [facility] Care Coordinator [facility]   Phone: [phone]

## 2023-03-29 NOTE — Patient Instructions (Signed)
Visit Information  Thank you for taking time to visit with me today. Please don't hesitate to contact me if I can be of assistance to you.   Following are the goals we discussed today:   Goals Addressed             This Visit's Progress    I want to keep my blood sugars under control       Patient Goals/Self Care Activities: -Patient/Caregiver will self-administer medications as prescribed as evidenced by self-report/primary caregiver report  -Patient/Caregiver will attend all scheduled provider appointments as evidenced by clinician review of documented attendance to scheduled appointments and patient/caregiver report -Patient/Caregiver will call pharmacy for medication refills as evidenced by patient report and review of pharmacy fill history as appropriate -Patient/Caregiver will call provider office for new concerns or questions as evidenced by review of documented incoming telephone call notes and patient report -Patient/Caregiver verbalizes understanding of plan -Patient/Caregiver will focus on medication adherence by taking medications as prescribed  -check blood sugar at prescribed times -record values and write them down take them to all doctor visits   -When we get you a glucometer Active listening / Reflection utilized  Emotional Support Provided          Our next appointment is by telephone on 04/20/23 at 1130 am  Please call the care guide team at [phone] if you need to cancel or reschedule your appointment.   If you are experiencing a Mental Health or Behavioral Health Crisis or need someone to talk to, please call 1-800-273-TALK (toll free, 24 hour hotline)  Patient verbalizes understanding of instructions and care plan provided today and agrees to view in MyChart. Active MyChart status and patient understanding of how to access instructions and care plan via MyChart confirmed with patient.     Juanell Fairly RN, BSN, [facility] Care Coordinator [facility]   Phone: [phone]

## 2023-03-30 ENCOUNTER — Telehealth: Payer: Self-pay

## 2023-03-30 NOTE — Telephone Encounter (Signed)
   Telephone encounter was:  Unsuccessful.  03/30/2023 Name: Leroy Simmons MRN: [id number] DOB: 07/16/1971  Unsuccessful outbound call made today to assist with:  Transportation Needs  and Financial Difficulties related to Financial strain  [employer] Attempt:  1st Attempt  A HIPAA compliant voice message was left requesting a return call.  Instructed patient to call back .   Lenard Forth [facility] Guide, [facility] [phone] [address] [place], [place], [place] [address] Phone: [phone] Email: [email or web address]

## 2023-03-31 ENCOUNTER — Telehealth: Payer: Self-pay

## 2023-03-31 NOTE — Telephone Encounter (Signed)
   Telephone encounter was:  Unsuccessful.  03/31/2023 Name: AESOP MILLHOUSE MRN: [id number] DOB: 07/25/1971  Unsuccessful outbound call made today to assist with:  Transportation Needs  and Financial Difficulties related to Financial strain  Outreach Attempt:  2nd Attempt  A HIPAA compliant voice message was left requesting a return call.  Instructed patient to call back    Lenard Forth [facility] Guide, [facility] [phone] [address] [place], [place], [place] [address] Phone: [phone] Email: [email or web address]

## 2023-04-03 ENCOUNTER — Telehealth: Payer: Self-pay

## 2023-04-03 NOTE — Telephone Encounter (Signed)
   Telephone encounter was:  Unsuccessful.  04/03/2023 Name: Leroy Simmons MRN: [id number] DOB: 1971-01-27  Unsuccessful outbound call made today to assist with:  Transportation Needs  and Financial Difficulties related to financial strain  Outreach Attempt:  3rd Attempt.  Referral closed unable to contact patient.  A HIPAA compliant voice message was left requesting a return call.  Instructed patient to call back .   Lenard Forth [facility] Guide, [facility] [phone] [address] [place], [place], [place] [address] Phone: [phone] Email: [email or web address]

## 2023-04-18 ENCOUNTER — Emergency Department (HOSPITAL_COMMUNITY)
Admission: EM | Admit: 2023-04-18 | Discharge: 2023-04-18 | Disposition: A | Discharge status: Home / Self Care | Payer: 59 | Attending: Emergency Medicine | Admitting: Emergency Medicine

## 2023-04-18 ENCOUNTER — Emergency Department (HOSPITAL_BASED_OUTPATIENT_CLINIC_OR_DEPARTMENT_OTHER): Payer: 59

## 2023-04-18 ENCOUNTER — Emergency Department (HOSPITAL_COMMUNITY): Payer: 59

## 2023-04-18 ENCOUNTER — Encounter (HOSPITAL_COMMUNITY): Payer: Self-pay

## 2023-04-18 ENCOUNTER — Other Ambulatory Visit: Payer: Self-pay

## 2023-04-18 DIAGNOSIS — M7989 Other specified soft tissue disorders: Secondary | ICD-10-CM | POA: Diagnosis not present

## 2023-04-18 DIAGNOSIS — I110 Hypertensive heart disease with heart failure: Secondary | ICD-10-CM | POA: Diagnosis not present

## 2023-04-18 DIAGNOSIS — Z794 Long term (current) use of insulin: Secondary | ICD-10-CM | POA: Diagnosis not present

## 2023-04-18 DIAGNOSIS — Z79899 Other long term (current) drug therapy: Secondary | ICD-10-CM | POA: Diagnosis not present

## 2023-04-18 DIAGNOSIS — I509 Heart failure, unspecified: Secondary | ICD-10-CM | POA: Diagnosis not present

## 2023-04-18 DIAGNOSIS — E119 Type 2 diabetes mellitus without complications: Secondary | ICD-10-CM | POA: Diagnosis not present

## 2023-04-18 DIAGNOSIS — Z7982 Long term (current) use of aspirin: Secondary | ICD-10-CM | POA: Insufficient documentation

## 2023-04-18 DIAGNOSIS — M25562 Pain in left knee: Secondary | ICD-10-CM | POA: Insufficient documentation

## 2023-04-18 MED ORDER — PREDNISONE 20 MG PO TABS
40.0000 mg | ORAL_TABLET | Freq: Once | ORAL | Status: AC
  Administered 2023-04-18: 40 mg via ORAL
  Filled 2023-04-18: qty 2

## 2023-04-18 NOTE — ED Notes (Signed)
Vascular US at bedside.

## 2023-04-18 NOTE — Progress Notes (Signed)
LLE venous duplex has been completed.  Preliminary results given to Dr. Rubin Payor.    Results can be found under chart review under CV PROC. 04/18/2023 7:02 PM Audrinna Sherman RVT, RDMS

## 2023-04-18 NOTE — ED Provider Notes (Signed)
[place] EMERGENCY DEPARTMENT AT [facility] Provider Note   CSN: [id number] Arrival date & time: 04/18/23  1748     History  No chief complaint on file.   Leroy Simmons is a 51 y.o. male.  HPI Patient presents with left knee pain.  Worse with standing.  Has had pain for around 5 days.  Does hurt somewhat to walk but not as much standing.  No swelling.  No injury.  Worse with movement of the knee.  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  Reports compliant with medication.  No chest pain or difficulty breathing.  Has been taking pain meds to help.   Past Medical History:  Diagnosis Date   Acute gangrenous appendicitis 09/25/2022   Anxiety    CHF (congestive heart failure) (HCC)    Depression    Diabetes mellitus without complication (HCC)    Hypertension     Home Medications Prior to Admission medications   Medication Sig Start Date End Date Taking? Authorizing Provider  albuterol (VENTOLIN HFA) 108 (90 Base) MCG/ACT inhaler Inhale 2 puffs into the lungs every 6 (six) hours as needed for wheezing or shortness of breath. 01/13/23  Yes Leslye Peer, MD  amLODipine (NORVASC) 10 MG tablet Take 1 tablet (10 mg total) by mouth daily. 03/22/23  Yes Rhetta Mura, MD  apixaban (ELIQUIS) 5 MG TABS tablet Take 1 tablet (5mg ) by mouth twice daily. 03/22/23  Yes Rhetta Mura, MD  aspirin EC 81 MG tablet Take 81 mg by mouth daily.   Yes [provider]  atorvastatin (LIPITOR) 80 MG tablet Take 1 tablet (80 mg total) by mouth daily. 01/25/23 03/13/24 Yes Arrien, York Ram, MD  baclofen (LIORESAL) 10 MG tablet Take 1 tablet (10 mg total) by mouth 2 (two) times daily as needed for muscle spasms. 03/22/23  Yes Rhetta Mura, MD  busPIRone (BUSPAR) 5 MG tablet Take 1 tablet (5 mg total) by mouth 2 (two) times daily as needed (anxiety). 03/22/23  Yes Rhetta Mura, MD  insulin glargine (LANTUS) 100 unit/mL SOPN Inject 22 Units into the skin 2 (two)  times daily. 03/22/23  Yes Rhetta Mura, MD  insulin lispro (HUMALOG KWIKPEN) 100 UNIT/ML KwikPen Inject 5 Units into the skin 3 (three) times daily before meals. Patient taking differently: Inject 5 Units into the skin 3 (three) times daily. 02/09/23  Yes Grayce Sessions, NP  lisinopril (ZESTRIL) 20 MG tablet Take 20 mg by mouth daily.   Yes [provider]  metoprolol succinate (TOPROL-XL) 50 MG 24 hr tablet Take 50 mg by mouth daily. Take with or immediately following a meal.   Yes [provider]  oxyCODONE (OXY IR/ROXICODONE) 5 MG immediate release tablet Take 1 tablet (5 mg total) by mouth every 4 (four) hours as needed for moderate pain. 03/22/23  Yes Rhetta Mura, MD  polyethylene glycol powder (GLYCOLAX/MIRALAX) 17 GM/SCOOP powder Take 17 g by mouth 2 (two) times daily. Patient taking differently: Take 17 g by mouth daily as needed for severe constipation. 03/22/23  Yes Rhetta Mura, MD  senna-docusate (SENOKOT-S) 8.6-50 MG tablet Take 1 tablet by mouth 2 (two) times daily. Patient taking differently: Take 1 tablet by mouth daily as needed for moderate constipation. 03/22/23  Yes Rhetta Mura, MD  torsemide (DEMADEX) 20 MG tablet Take 2 tablets (40 mg total) by mouth 2 (two) times daily. 02/08/23  Yes Robbie Lis M, PA-C  blood glucose meter kit and supplies Dispense based on patient and insurance  preference. Use up to four times daily as directed. (FOR ICD-10 E10.9, E11.9). 05/24/22   [place], Buren Kos, MD  isosorbide mononitrate (IMDUR) 30 MG 24 hr tablet Take 1 tablet (30 mg total) by mouth daily. Patient not taking: Reported on 03/14/2023 01/24/23 03/13/24  Arrien, York Ram, MD      Allergies    Patient has no known allergies.    Review of Systems   Review of Systems  Physical Exam Updated Vital Signs BP (!) 161/87   Pulse 96   Temp (!) 97.3 F (36.3 C) (Oral)   Resp 17   SpO2 100%  Physical Exam Vitals and nursing  note reviewed.  HENT:     Head: Atraumatic.  Cardiovascular:     Rate and Rhythm: Regular rhythm.  Pulmonary:     Breath sounds: No wheezing.  Chest:     Chest wall: No tenderness.  Abdominal:     Tenderness: There is no abdominal tenderness.  Musculoskeletal:     Comments: Mild tenderness left knee.  Mild pain with movement but not irritable.  No effusion.  No large swelling.  No specific calf tenderness.  Neurological:     Mental Status: He is alert.     ED Results / Procedures / Treatments   Labs (all labs ordered are listed, but only abnormal results are displayed) Labs Reviewed - No data to display  EKG None  Radiology VAS [place] LOWER EXTREMITY VENOUS (DVT)  Result Date: 04/18/2023  Lower Venous DVT Study Patient Name:  Leroy Simmons  Date of Exam:   04/18/2023 Medical Rec #: [id number]       Accession #:    [id number] Date of Birth: 07-Apr-1971       Patient Gender: M Patient Age:   24 years Exam Location:  [facility] Procedure:      VAS [place] LOWER EXTREMITY VENOUS (DVT) Referring Phys: ABIGAIL HARRIS --------------------------------------------------------------------------------  Indications: Pain.  Anticoagulation: Eliquis. Comparison Study: Previous exam on 01/19/23 was positive for DVT in LLE peroneal                   veins Performing Technologist: Ernestene Mention RVT, RDMS  Examination Guidelines: A complete evaluation includes B-mode imaging, spectral Doppler, color Doppler, and power Doppler as needed of all accessible portions of each vessel. Bilateral testing is considered an integral part of a complete examination. Limited examinations for reoccurring indications may be performed as noted. The reflux portion of the exam is performed with the patient in reverse Trendelenburg.  +-----+---------------+---------+-----------+----------+--------------+ RIGHTCompressibilityPhasicitySpontaneityPropertiesThrombus Aging  +-----+---------------+---------+-----------+----------+--------------+ CFV  Full           Yes      Yes                                 +-----+---------------+---------+-----------+----------+--------------+   +---------+---------------+---------+-----------+----------+--------------+ LEFT     CompressibilityPhasicitySpontaneityPropertiesThrombus Aging +---------+---------------+---------+-----------+----------+--------------+ CFV      Full           Yes      Yes                                 +---------+---------------+---------+-----------+----------+--------------+ SFJ      Full                                                        +---------+---------------+---------+-----------+----------+--------------+  FV Prox  Full           Yes      Yes                                 +---------+---------------+---------+-----------+----------+--------------+ FV Mid   Full           Yes      Yes                                 +---------+---------------+---------+-----------+----------+--------------+ FV DistalFull           Yes      Yes                                 +---------+---------------+---------+-----------+----------+--------------+ PFV      Full                                                        +---------+---------------+---------+-----------+----------+--------------+ POP      Full           Yes      Yes                                 +---------+---------------+---------+-----------+----------+--------------+ PTV      Full                                                        +---------+---------------+---------+-----------+----------+--------------+ PERO     Full                                                        +---------+---------------+---------+-----------+----------+--------------+    Summary: RIGHT: - No evidence of common femoral vein obstruction.  LEFT: - There is no evidence of deep vein thrombosis in the lower  extremity.  - No cystic structure found in the popliteal fossa.  *See table(s) above for measurements and observations. Electronically signed by Coral Else MD on 04/18/2023 at 9:32:34 PM.    Final    DG Knee Complete 4 Views Left  Result Date: 04/18/2023 CLINICAL DATA:  Pain. EXAM: LEFT KNEE - COMPLETE 4+ VIEW COMPARISON:  None Available. FINDINGS: No evidence of fracture, dislocation, or joint effusion. No evidence of arthropathy or other focal bone abnormality. Soft tissues are unremarkable. IMPRESSION: Negative. Electronically Signed   By: Larose Hires D.O.   On: 04/18/2023 20:47    Procedures Procedures    Medications Ordered in ED Medications  predniSONE (DELTASONE) tablet 40 mg (40 mg Oral Given 04/18/23 2137)    ED Course/ Medical Decision Making/ A&P                             Medical Decision Making Risk  Prescription drug management.   Patient with left knee pain.  Has had for last 2 days.  Worse with standing.  Potentially musculoskeletal.  However he had previous DVT.  Doppler done and was negative.  Does have good pulse in foot.  No cellulitis seen.  Doubt gout and he is not really irritable.  Will get x-ray to evaluate for arthritis or other causes.  X-ray reassuring.  Doubt causes such as gout.  No effusion seen.  Will give single dose of steroids to help fully help with some inflammation.  Has pain meds at home.  Has a history of hyperglycemia is poorly controlled would not want to do more steroids.  Will discharge home with ortho follow-up as needed.        Final Clinical Impression(s) / ED Diagnoses Final diagnoses:  Acute pain of left knee    Rx / DC Orders ED Discharge Orders     None         Benjiman Core, MD 04/18/23 2248

## 2023-04-18 NOTE — ED Notes (Signed)
Pt d/c home per MD order. Discharge summary reviewed, verbalizes understanding, reports neighbor is discharge ride home.

## 2023-04-18 NOTE — ED Triage Notes (Signed)
Pt c/o L leg and knee pain, aching and throbbing, difficulty ambulating x 5-4 days, worsening; denies known injury; taking oxycodone with come relief; hx DVT, pt on eliquis; pain worse with ambulation; no swelling or redness noted to LE

## 2023-04-20 ENCOUNTER — Telehealth: Payer: Self-pay

## 2023-04-20 NOTE — Patient Outreach (Signed)
  Care Coordination   04/20/2023 Name: Leroy Simmons MRN: [id number] DOB: 1971/07/17   Care Coordination Outreach Attempts:  An unsuccessful telephone outreach was attempted today to offer the patient information about available care coordination services.  Follow Up Plan:  Additional outreach attempts will be made to offer the patient care coordination information and services.   Encounter Outcome:  No Answer   Care Coordination Interventions:  No, not indicated    Juanell Fairly RN, BSN, [facility] Care Coordinator [facility]   Phone: [phone]

## 2023-04-26 ENCOUNTER — Other Ambulatory Visit (HOSPITAL_COMMUNITY): Payer: Self-pay

## 2023-04-27 ENCOUNTER — Other Ambulatory Visit (HOSPITAL_COMMUNITY): Payer: Self-pay

## 2023-04-28 ENCOUNTER — Telehealth: Payer: Self-pay | Admitting: *Deleted

## 2023-04-28 NOTE — Progress Notes (Signed)
  Care Coordination Note  04/28/2023 Name: NICOLUS METCALFE MRN: [id number] DOB: Feb 23, 1971  Janace Hoard is a 52 y.o. year old male who is a primary care patient of Grayce Sessions, NP and is actively engaged with the care management team. I reached out to Janace Hoard by phone today to assist with re-scheduling a follow up visit with the RN Case Manager  Follow up plan: Unsuccessful telephone outreach attempt made. A HIPAA compliant phone message was left for the patient providing contact information and requesting a return call.   [facility]  Care Coordination Care Guide  Direct Dial: [phone]

## 2023-04-28 NOTE — Progress Notes (Signed)
  Care Coordination Note  04/28/2023 Name: Leroy Simmons MRN: [id number] DOB: 1971/05/23  Leroy Simmons is a 52 y.o. year old male who is a primary care patient of Grayce Sessions, NP and is actively engaged with the care management team. I reached out to Leroy Simmons by phone today to assist with re-scheduling a follow up visit with the RN Case Manager  Follow up plan: Telephone appointment with care management team member scheduled for:05/11/23  [facility] Coordination Care Guide  Direct Dial: [phone]

## 2023-05-11 ENCOUNTER — Telehealth: Payer: Self-pay | Admitting: *Deleted

## 2023-05-11 NOTE — Progress Notes (Signed)
  Care Coordination Note  05/11/2023 Name: ISAYA CARIAS MRN: [id number] DOB: 06-26-1971  Janace Hoard is a 52 y.o. year old male who is a primary care patient of Grayce Sessions, NP and is actively engaged with the care management team. I reached out to Janace Hoard by phone today to assist with re-scheduling a follow up visit with the RN Case Manager  Follow up plan: Unsuccessful telephone outreach attempt made. A HIPAA compliant phone message was left for the patient providing contact information and requesting a return call.   [facility]  Care Coordination Care Guide  Direct Dial: [phone]

## 2023-05-12 NOTE — Progress Notes (Signed)
  Care Coordination Note  05/12/2023 Name: Leroy Simmons MRN: [id number] DOB: [birth year]  Leroy Simmons is a 52 y.o. year old male who is a primary care patient of Grayce Sessions, NP and is actively engaged with the care management team. I reached out to Leroy Simmons by phone today to assist with re-scheduling a follow up visit with the RN Case Manager  Follow up plan: Unsuccessful telephone outreach attempt made. A HIPAA compliant phone message was left for the patient providing contact information and requesting a return call.   [facility]  Care Coordination Care Guide  Direct Dial: [phone]

## 2023-05-15 ENCOUNTER — Encounter (INDEPENDENT_AMBULATORY_CARE_PROVIDER_SITE_OTHER): Payer: Self-pay

## 2023-05-15 ENCOUNTER — Ambulatory Visit (INDEPENDENT_AMBULATORY_CARE_PROVIDER_SITE_OTHER): Payer: Self-pay | Admitting: Primary Care

## 2023-05-17 ENCOUNTER — Other Ambulatory Visit (HOSPITAL_COMMUNITY): Payer: Self-pay

## 2023-05-22 ENCOUNTER — Telehealth: Payer: Self-pay | Admitting: *Deleted

## 2023-05-22 NOTE — Progress Notes (Signed)
  Care Coordination Note  05/22/2023 Name: [name] MRN: [id number] DOB: 29-Nov-1970  Leroy Simmons is a 52 y.o. year old male who is a primary care patient of Grayce Sessions, NP and is actively engaged with the care management team. I reached out to Leroy Simmons by phone today to assist with re-scheduling a follow up visit with the RN Case Manager  Follow up plan: Unsuccessful telephone outreach attempt made. A HIPAA compliant phone message was left for the patient providing contact information and requesting a return call.  We have been unable to make contact with the patient for follow up. The care management team is available to follow up with the patient after provider conversation with the patient regarding recommendation for care management engagement and subsequent re-referral to the care management team.   [facility] Coordination Care Guide  Direct Dial: [phone]

## 2023-05-24 ENCOUNTER — Ambulatory Visit (INDEPENDENT_AMBULATORY_CARE_PROVIDER_SITE_OTHER): Payer: 59 | Admitting: Primary Care

## 2023-05-26 ENCOUNTER — Emergency Department (HOSPITAL_COMMUNITY)
Admission: EM | Admit: 2023-05-26 | Discharge: 2023-05-27 | Disposition: A | Discharge status: Home / Self Care | Payer: 59 | Attending: Emergency Medicine | Admitting: Emergency Medicine

## 2023-05-26 ENCOUNTER — Other Ambulatory Visit: Payer: Self-pay

## 2023-05-26 DIAGNOSIS — Z794 Long term (current) use of insulin: Secondary | ICD-10-CM | POA: Diagnosis not present

## 2023-05-26 DIAGNOSIS — Z7982 Long term (current) use of aspirin: Secondary | ICD-10-CM | POA: Diagnosis not present

## 2023-05-26 DIAGNOSIS — Z7901 Long term (current) use of anticoagulants: Secondary | ICD-10-CM | POA: Insufficient documentation

## 2023-05-26 DIAGNOSIS — L0201 Cutaneous abscess of face: Secondary | ICD-10-CM | POA: Insufficient documentation

## 2023-05-26 DIAGNOSIS — L0291 Cutaneous abscess, unspecified: Secondary | ICD-10-CM

## 2023-05-26 DIAGNOSIS — Z79899 Other long term (current) drug therapy: Secondary | ICD-10-CM | POA: Insufficient documentation

## 2023-05-26 MED ORDER — LIDOCAINE HCL (PF) 1 % IJ SOLN
5.0000 mL | Freq: Once | INTRAMUSCULAR | Status: AC
  Administered 2023-05-27: 5 mL
  Filled 2023-05-26: qty 5

## 2023-05-26 NOTE — ED Provider Notes (Signed)
[place] EMERGENCY DEPARTMENT AT [facility] Provider Note   CSN: [id number] Arrival date & time: 05/26/23  1907     History {Add pertinent medical, surgical, social history, OB history to HPI:1} Chief Complaint  Patient presents with   Abscess    Leroy Simmons is a 52 y.o. male.  Presents with concerns over skin abscess on the right side of his face.  Patient reports a tender swollen area in front of the right ear that has been getting larger.  There is a small amount of drainage.  Patient reports that the area is very tender to the touch.  No dental problems.       Home Medications Prior to Admission medications   Medication Sig Start Date End Date Taking? Authorizing Provider  albuterol (VENTOLIN HFA) 108 (90 Base) MCG/ACT inhaler Inhale 2 puffs into the lungs every 6 (six) hours as needed for wheezing or shortness of breath. 01/13/23   Leslye Peer, MD  amLODipine (NORVASC) 10 MG tablet Take 1 tablet (10 mg total) by mouth daily. 03/22/23   Rhetta Mura, MD  apixaban (ELIQUIS) 5 MG TABS tablet Take 1 tablet (5mg ) by mouth twice daily. 03/22/23   Rhetta Mura, MD  aspirin EC 81 MG tablet Take 81 mg by mouth daily.    [provider]  atorvastatin (LIPITOR) 80 MG tablet Take 1 tablet (80 mg total) by mouth daily. 01/25/23 03/13/24  Arrien, York Ram, MD  baclofen (LIORESAL) 10 MG tablet Take 1 tablet (10 mg total) by mouth 2 (two) times daily as needed for muscle spasms. 03/22/23   Rhetta Mura, MD  blood glucose meter kit and supplies Dispense based on patient and insurance preference. Use up to four times daily as directed. (FOR ICD-10 E10.9, E11.9). 05/24/22   [place], Buren Kos, MD  busPIRone (BUSPAR) 5 MG tablet Take 1 tablet (5 mg total) by mouth 2 (two) times daily as needed (anxiety). 03/22/23   Rhetta Mura, MD  insulin glargine (LANTUS) 100 unit/mL SOPN Inject 22 Units into the skin 2 (two) times daily. 03/22/23    Rhetta Mura, MD  insulin lispro (HUMALOG KWIKPEN) 100 UNIT/ML KwikPen Inject 5 Units into the skin 3 (three) times daily before meals. Patient taking differently: Inject 5 Units into the skin 3 (three) times daily. 02/09/23   Grayce Sessions, NP  isosorbide mononitrate (IMDUR) 30 MG 24 hr tablet Take 1 tablet (30 mg total) by mouth daily. Patient not taking: Reported on 03/14/2023 01/24/23 03/13/24  Arrien, York Ram, MD  lisinopril (ZESTRIL) 20 MG tablet Take 20 mg by mouth daily.    [provider]  metoprolol succinate (TOPROL-XL) 50 MG 24 hr tablet Take 50 mg by mouth daily. Take with or immediately following a meal.    [provider]  oxyCODONE (OXY IR/ROXICODONE) 5 MG immediate release tablet Take 1 tablet (5 mg total) by mouth every 4 (four) hours as needed for moderate pain. 03/22/23   Rhetta Mura, MD  polyethylene glycol powder (GLYCOLAX/MIRALAX) 17 GM/SCOOP powder Take 17 g by mouth 2 (two) times daily. Patient taking differently: Take 17 g by mouth daily as needed for severe constipation. 03/22/23   Rhetta Mura, MD  senna-docusate (SENOKOT-S) 8.6-50 MG tablet Take 1 tablet by mouth 2 (two) times daily. Patient taking differently: Take 1 tablet by mouth daily as needed for moderate constipation. 03/22/23   Rhetta Mura, MD  torsemide (DEMADEX) 20 MG tablet Take 2 tablets (40 mg total) by mouth  2 (two) times daily. 02/08/23   Allayne Butcher, PA-C      Allergies    Patient has no known allergies.    Review of Systems   Review of Systems  Physical Exam Updated Vital Signs BP (!) 173/96   Pulse 100   Temp 98.4 F (36.9 C)   Resp 17   SpO2 100%  Physical Exam Vitals and nursing note reviewed.  Constitutional:      General: He is not in acute distress.    Appearance: He is well-developed.  HENT:     Head: Normocephalic and atraumatic.      Mouth/Throat:     Mouth: Mucous membranes are moist.  Eyes:     General:  Vision grossly intact. Gaze aligned appropriately.     Extraocular Movements: Extraocular movements intact.     Conjunctiva/sclera: Conjunctivae normal.  Cardiovascular:     Rate and Rhythm: Normal rate and regular rhythm.     Pulses: Normal pulses.     Heart sounds: Normal heart sounds, S1 normal and S2 normal. No murmur heard.    No friction rub. No gallop.  Pulmonary:     Effort: Pulmonary effort is normal. No respiratory distress.     Breath sounds: Normal breath sounds.  Abdominal:     Palpations: Abdomen is soft.     Tenderness: There is no abdominal tenderness. There is no guarding or rebound.     Hernia: No hernia is present.  Musculoskeletal:        General: No swelling.     Cervical back: Full passive range of motion without pain, normal range of motion and neck supple. No pain with movement, spinous process tenderness or muscular tenderness. Normal range of motion.     Right lower leg: No edema.     Left lower leg: No edema.  Skin:    General: Skin is warm and dry.     Capillary Refill: Capillary refill takes less than 2 seconds.     Findings: Abscess (2 cm tender, slightly indurated erythematous region right preauricular area) present. No ecchymosis, erythema, lesion or wound.  Neurological:     Mental Status: He is alert and oriented to person, place, and time.     GCS: GCS eye subscore is 4. GCS verbal subscore is 5. GCS motor subscore is 6.     Cranial Nerves: Cranial nerves 2-12 are intact.     Sensory: Sensation is intact.     Motor: Motor function is intact. No weakness or abnormal muscle tone.     Coordination: Coordination is intact.  Psychiatric:        Mood and Affect: Mood normal.        Speech: Speech normal.        Behavior: Behavior normal.     ED Results / Procedures / Treatments   Labs (all labs ordered are listed, but only abnormal results are displayed) Labs Reviewed - No data to display  EKG None  Radiology No results  found.  Procedures Procedures  {Document cardiac monitor, telemetry assessment procedure when appropriate:1}  Medications Ordered in ED Medications  lidocaine (PF) (XYLOCAINE) 1 % injection 5 mL (has no administration in time range)    ED Course/ Medical Decision Making/ A&P   {   Click here for ABCD2, HEART and other calculatorsREFRESH Note before signing :1}  Medical Decision Making Risk Prescription drug management.   ***  {Document critical care time when appropriate:1} {Document review of labs and clinical decision tools ie heart score, Chads2Vasc2 etc:1}  {Document your independent review of radiology images, and any outside records:1} {Document your discussion with family members, caretakers, and with consultants:1} {Document social determinants of health affecting pt's care:1} {Document your decision making why or why not admission, treatments were needed:1} Final Clinical Impression(s) / ED Diagnoses Final diagnoses:  None    Rx / DC Orders ED Discharge Orders     None

## 2023-05-26 NOTE — ED Triage Notes (Signed)
Patient reports skin abscess at right lateral face with swelling and drainage for 3 day.

## 2023-05-27 MED ORDER — SULFAMETHOXAZOLE-TRIMETHOPRIM 800-160 MG PO TABS: 1.0000 | ORAL_TABLET | Freq: Two times a day (BID) | ORAL | 0 refills | Status: DC

## 2023-05-27 MED ORDER — SULFAMETHOXAZOLE-TRIMETHOPRIM 800-160 MG PO TABS
1.0000 | ORAL_TABLET | Freq: Once | ORAL | Status: AC
  Administered 2023-05-27: 1 via ORAL
  Filled 2023-05-27: qty 1

## 2023-05-27 MED ORDER — HYDROCODONE-ACETAMINOPHEN 5-325 MG PO TABS
1.0000 | ORAL_TABLET | Freq: Once | ORAL | Status: AC
  Administered 2023-05-27: 1 via ORAL
  Filled 2023-05-27: qty 1

## 2023-05-30 ENCOUNTER — Other Ambulatory Visit (HOSPITAL_COMMUNITY): Payer: Self-pay

## 2023-06-01 ENCOUNTER — Emergency Department (HOSPITAL_COMMUNITY): Payer: 59

## 2023-06-01 ENCOUNTER — Encounter (INDEPENDENT_AMBULATORY_CARE_PROVIDER_SITE_OTHER): Payer: Self-pay | Admitting: Primary Care

## 2023-06-01 ENCOUNTER — Encounter (HOSPITAL_COMMUNITY): Payer: Self-pay | Admitting: Emergency Medicine

## 2023-06-01 ENCOUNTER — Emergency Department (HOSPITAL_COMMUNITY)
Admission: EM | Admit: 2023-06-01 | Discharge: 2023-06-02 | Disposition: A | Discharge status: Home / Self Care | Payer: 59 | Attending: Emergency Medicine | Admitting: Emergency Medicine

## 2023-06-01 ENCOUNTER — Other Ambulatory Visit: Payer: Self-pay

## 2023-06-01 ENCOUNTER — Telehealth: Payer: Self-pay | Admitting: Internal Medicine

## 2023-06-01 ENCOUNTER — Ambulatory Visit (INDEPENDENT_AMBULATORY_CARE_PROVIDER_SITE_OTHER): Payer: 59 | Admitting: Primary Care

## 2023-06-01 VITALS — BP 171/101 | HR 98 | Resp 14 | Ht 67.0 in | Wt 240.6 lb

## 2023-06-01 DIAGNOSIS — Z7982 Long term (current) use of aspirin: Secondary | ICD-10-CM | POA: Diagnosis not present

## 2023-06-01 DIAGNOSIS — Z7901 Long term (current) use of anticoagulants: Secondary | ICD-10-CM | POA: Insufficient documentation

## 2023-06-01 DIAGNOSIS — I509 Heart failure, unspecified: Secondary | ICD-10-CM | POA: Diagnosis not present

## 2023-06-01 DIAGNOSIS — I10 Essential (primary) hypertension: Secondary | ICD-10-CM | POA: Diagnosis not present

## 2023-06-01 DIAGNOSIS — R778 Other specified abnormalities of plasma proteins: Secondary | ICD-10-CM | POA: Insufficient documentation

## 2023-06-01 DIAGNOSIS — Z794 Long term (current) use of insulin: Secondary | ICD-10-CM | POA: Diagnosis not present

## 2023-06-01 DIAGNOSIS — I272 Pulmonary hypertension, unspecified: Secondary | ICD-10-CM

## 2023-06-01 DIAGNOSIS — T751XXA Unspecified effects of drowning and nonfatal submersion, initial encounter: Secondary | ICD-10-CM | POA: Diagnosis not present

## 2023-06-01 DIAGNOSIS — R609 Edema, unspecified: Secondary | ICD-10-CM | POA: Diagnosis not present

## 2023-06-01 DIAGNOSIS — E119 Type 2 diabetes mellitus without complications: Secondary | ICD-10-CM

## 2023-06-01 DIAGNOSIS — R600 Localized edema: Secondary | ICD-10-CM | POA: Diagnosis not present

## 2023-06-01 DIAGNOSIS — Z7984 Long term (current) use of oral hypoglycemic drugs: Secondary | ICD-10-CM

## 2023-06-01 DIAGNOSIS — E8779 Other fluid overload: Secondary | ICD-10-CM | POA: Diagnosis not present

## 2023-06-01 DIAGNOSIS — I110 Hypertensive heart disease with heart failure: Secondary | ICD-10-CM | POA: Insufficient documentation

## 2023-06-01 DIAGNOSIS — R030 Elevated blood-pressure reading, without diagnosis of hypertension: Secondary | ICD-10-CM | POA: Diagnosis not present

## 2023-06-01 DIAGNOSIS — R0989 Other specified symptoms and signs involving the circulatory and respiratory systems: Secondary | ICD-10-CM | POA: Diagnosis not present

## 2023-06-01 DIAGNOSIS — Z743 Need for continuous supervision: Secondary | ICD-10-CM | POA: Diagnosis not present

## 2023-06-01 DIAGNOSIS — E8770 Fluid overload, unspecified: Secondary | ICD-10-CM | POA: Diagnosis not present

## 2023-06-01 DIAGNOSIS — Z79899 Other long term (current) drug therapy: Secondary | ICD-10-CM | POA: Diagnosis not present

## 2023-06-01 DIAGNOSIS — J811 Chronic pulmonary edema: Secondary | ICD-10-CM | POA: Diagnosis not present

## 2023-06-01 DIAGNOSIS — F32A Depression, unspecified: Secondary | ICD-10-CM

## 2023-06-01 DIAGNOSIS — R0602 Shortness of breath: Secondary | ICD-10-CM | POA: Diagnosis not present

## 2023-06-01 LAB — CBC
HCT: 33.3 % — ABNORMAL LOW (ref 39.0–52.0)
Hemoglobin: 10.8 g/dL — ABNORMAL LOW (ref 13.0–17.0)
MCH: 30 pg (ref 26.0–34.0)
MCHC: 32.4 g/dL (ref 30.0–36.0)
MCV: 92.5 fL (ref 80.0–100.0)
Platelets: 329 10*3/uL (ref 150–400)
RBC: 3.6 MIL/uL — ABNORMAL LOW (ref 4.22–5.81)
RDW: 12 % (ref 11.5–15.5)
WBC: 8 10*3/uL (ref 4.0–10.5)
nRBC: 0 % (ref 0.0–0.2)

## 2023-06-01 LAB — TROPONIN I (HIGH SENSITIVITY)
Troponin I (High Sensitivity): 42 ng/L — ABNORMAL HIGH (ref <18)
Troponin I (High Sensitivity): 48 ng/L — ABNORMAL HIGH (ref <18)

## 2023-06-01 LAB — BASIC METABOLIC PANEL
Anion gap: 11 (ref 5–15)
BUN: 25 mg/dL — ABNORMAL HIGH (ref 6–20)
CO2: 20 mmol/L — ABNORMAL LOW (ref 22–32)
Calcium: 8.5 mg/dL — ABNORMAL LOW (ref 8.9–10.3)
Chloride: 107 mmol/L (ref 98–111)
Creatinine, Ser: 1.92 mg/dL — ABNORMAL HIGH (ref 0.61–1.24)
GFR, Estimated: 42 mL/min — ABNORMAL LOW (ref >60)
Glucose, Bld: 198 mg/dL — ABNORMAL HIGH (ref 70–99)
Potassium: 4.1 mmol/L (ref 3.5–5.1)
Sodium: 138 mmol/L (ref 135–145)

## 2023-06-01 LAB — BRAIN NATRIURETIC PEPTIDE: B Natriuretic Peptide: 175.3 pg/mL — ABNORMAL HIGH (ref 0.0–100.0)

## 2023-06-01 LAB — POCT GLYCOSYLATED HEMOGLOBIN (HGB A1C): HbA1c, POC (controlled diabetic range): 9 % — AB (ref 0.0–7.0)

## 2023-06-01 NOTE — Telephone Encounter (Signed)
Edwards, NP PCP office called in stating pt bp is 171/101, would like to speak to DOD

## 2023-06-01 NOTE — ED Provider Triage Note (Signed)
Emergency Medicine Provider Triage Evaluation Note  Leroy Simmons , a 52 y.o. male  was evaluated in triage.  Pt complains of shortness of breath for the past 3 to 4 days of bilateral leg swelling for that same timeframe.  Patient was sent over here from his primary care doctor for elevated blood pressure as well as the dyspnea on exertion and bilateral lower leg swelling.  In the biters note, medical compliance was questioned.  He is speaking in full sentences currently and does not appear in any acute respiratory distress.  Satting well on room air without any increased work of breathing.  Patient does have elevated blood pressure.  He feels mainly short of breath whenever he lays down and upon exertion.  Feels more chest tightness instead of chest pain.  Review of Systems  Positive:  Negative:   Physical Exam  BP (!) 172/114   Pulse 92   Temp 99.2 F (37.3 C) (Oral)   Resp 17   SpO2 99%  Gen:   Awake, no distress   Resp:  Normal effort speaking in full sentences without any to pause.  Lung sounds are diminished at the bases. MSK:   Moves extremities without difficulty  Other:  2+ pitting edema bilaterally.  No overlying cellulitis or increase warmth.  Medical Decision Making  Medically screening exam initiated at 6:04 PM.  Appropriate orders placed.  Leroy Simmons was informed that the remainder of the evaluation will be completed by another provider, this initial triage assessment does not replace that evaluation, and the importance of remaining in the ED until their evaluation is complete.  CHF workup initiated.   Leroy Simmons, [place] 06/01/23 1805

## 2023-06-01 NOTE — ED Triage Notes (Signed)
BIB EMS from doctor's office where he was for follow up.  SHOB x a few days.  Exertional shob.  "Feels like he is drowning"  CHF hx.  No chest pain. No dizziness.  168/100, pulse 90, RR 16, 99% on RA.  Lungs CTA

## 2023-06-01 NOTE — Progress Notes (Signed)
[facility]   Leroy Simmons is a 52 y.o. male presents for hypertension evaluation, Endorses shortness of breath feels as though he is gasping for air.  Denies headaches, chest pain sudden onset, vision changes, unilateral weakness, dizziness, paresthesias . Bilateral LE edema +2.  Patient reports adherence with medications. Reviewed medication and dates prescribed does not add up to taking medication as prescribed. Called cardiology on call Dr. Servando Salina recommended he be evaluated in ED. Asked patient would he like to be transported by EMS yes.  T2D -Denies polyuria, polydipsia, polyphasia   Does not check blood sugars at home. Previous A1C was 12.4.   Past Medical History:  Diagnosis Date   Acute gangrenous appendicitis 09/25/2022   Anxiety    CHF (congestive heart failure) (HCC)    Depression    Diabetes mellitus without complication (HCC)    Hypertension    Past Surgical History:  Procedure Laterality Date   LAPAROSCOPIC APPENDECTOMY N/A 09/26/2022   Procedure: APPENDECTOMY LAPAROSCOPIC;  Surgeon: Campbell Lerner, MD;  Location: [facility] ORS;  Service: General;  Laterality: N/A;   LEFT HEART CATH AND CORONARY ANGIOGRAPHY N/A 03/21/2022   Procedure: LEFT HEART CATH AND CORONARY ANGIOGRAPHY;  Surgeon: Corky Crafts, MD;  Location: [facility] INVASIVE CV LAB;  Service: Cardiovascular;  Laterality: N/A;   No Known Allergies Current Outpatient Medications on File Prior to Visit  Medication Sig Dispense Refill   albuterol (VENTOLIN HFA) 108 (90 Base) MCG/ACT inhaler Inhale 2 puffs into the lungs every 6 (six) hours as needed for wheezing or shortness of breath. 8 g 2   amLODipine (NORVASC) 10 MG tablet Take 1 tablet (10 mg total) by mouth daily. 30 tablet 3   aspirin EC 81 MG tablet Take 81 mg by mouth daily.     atorvastatin (LIPITOR) 80 MG tablet Take 1 tablet (80 mg total) by mouth daily. 30 tablet 0   insulin glargine (LANTUS) 100 unit/mL SOPN Inject 22 Units into the skin 2  (two) times daily. 15 mL 1   insulin lispro (HUMALOG KWIKPEN) 100 UNIT/ML KwikPen Inject 5 Units into the skin 3 (three) times daily before meals. (Patient taking differently: Inject 5 Units into the skin 3 (three) times daily.) 9 mL 11   isosorbide mononitrate (IMDUR) 30 MG 24 hr tablet Take 1 tablet (30 mg total) by mouth daily. 30 tablet 0   lisinopril (ZESTRIL) 20 MG tablet Take 20 mg by mouth daily.     metoprolol succinate (TOPROL-XL) 50 MG 24 hr tablet Take 50 mg by mouth daily. Take with or immediately following a meal.     polyethylene glycol powder (GLYCOLAX/MIRALAX) 17 GM/SCOOP powder Take 17 g by mouth 2 (two) times daily. (Patient taking differently: Take 17 g by mouth daily as needed for severe constipation.) 238 g 0   senna-docusate (SENOKOT-S) 8.6-50 MG tablet Take 1 tablet by mouth 2 (two) times daily. (Patient taking differently: Take 1 tablet by mouth daily as needed for moderate constipation.) 60 tablet 0   sulfamethoxazole-trimethoprim (BACTRIM DS) 800-160 MG tablet Take 1 tablet by mouth 2 (two) times daily. 20 tablet 0   torsemide (DEMADEX) 20 MG tablet Take 2 tablets (40 mg total) by mouth 2 (two) times daily. 120 tablet 5   apixaban (ELIQUIS) 5 MG TABS tablet Take 1 tablet (5mg ) by mouth twice daily. (Patient not taking: Reported on 06/01/2023) 60 tablet 0   blood glucose meter kit and supplies Dispense based on patient and insurance preference. Use up to four  times daily as directed. (FOR ICD-10 E10.9, E11.9). 1 each 0   oxyCODONE (OXY IR/ROXICODONE) 5 MG immediate release tablet Take 1 tablet (5 mg total) by mouth every 4 (four) hours as needed for moderate pain. (Patient not taking: Reported on 06/01/2023) 30 tablet 0   No current facility-administered medications on file prior to visit.   Social History   Socioeconomic History   Marital status: Single    Spouse name: Not on file   Number of children: 0   Years of education: Not on file   Highest education level: High  school graduate  Occupational History   Occupation: retail  Tobacco Use   Smoking status: Never   Smokeless tobacco: Never  Vaping Use   Vaping status: Never Used  Substance and Sexual Activity   Alcohol use: Yes    Comment: rarely   Drug use: Never   Sexual activity: Not on file  Other Topics Concern   Not on file  Social History Narrative   Not on file   Social Determinants of Health   Financial Resource Strain: Low Risk  (01/20/2023)   Overall Financial Resource Strain (CARDIA)    Difficulty of Paying Living Expenses: Not very hard  Food Insecurity: Food Insecurity Present (03/29/2023)   Hunger Vital Sign    Worried About Running Out of Food in the Last Year: Often true    Ran Out of Food in the Last Year: Often true  Transportation Needs: Unmet Transportation Needs (03/29/2023)   PRAPARE - Administrator, Civil Service (Medical): No    Lack of Transportation (Non-Medical): Yes  Physical Activity: Not on file  Stress: Not on file  Social Connections: Not on file  Intimate Partner Violence: Not At Risk (01/19/2023)   Humiliation, Afraid, Rape, and Kick questionnaire    Fear of Current or Ex-Partner: No    Emotionally Abused: No    Physically Abused: No    Sexually Abused: No   Family History  Problem Relation Age of Onset   Heart failure Mother      OBJECTIVE:  Vitals:   06/01/23 1546 06/01/23 1551  BP: (!) 169/109 (!) 171/101  Pulse: 98   Resp: 14   SpO2: 97%   Weight: 240 lb 9.6 oz (109.1 kg)   Height: 5\' 7"  (1.702 m)     Physical Exam Vitals reviewed.  Constitutional:      Appearance: He is obese.  HENT:     Head: Normocephalic.     Right Ear: External ear normal.     Left Ear: External ear normal.  Eyes:     Extraocular Movements: Extraocular movements intact.  Cardiovascular:     Rate and Rhythm: Normal rate and regular rhythm.  Pulmonary:     Comments: Shallow breath sounds Abdominal:     General: Bowel sounds are normal. There  is distension.     Palpations: Abdomen is soft.  Musculoskeletal:     Cervical back: Normal range of motion and neck supple.     Right lower leg: Edema present.     Left lower leg: Edema present.  Skin:    General: Skin is warm and dry.  Neurological:     Mental Status: He is alert and oriented to person, place, and time.  Psychiatric:        Mood and Affect: Mood normal.        Behavior: Behavior normal.    ROS Comprehensive ROS Pertinent positive and negative noted in HPI  Last 3 Office BP readings: BP Readings from Last 3 Encounters:  06/01/23 (!) 171/101  05/26/23 (!) 166/98  04/18/23 (!) 161/87    BMET    Component Value Date/Time   NA 138 03/22/2023 0025   NA 139 03/29/2022 1059   K 4.1 03/22/2023 0025   CL 98 03/22/2023 0025   CO2 27 03/22/2023 0025   GLUCOSE 309 (H) 03/22/2023 0025   BUN 43 (H) 03/22/2023 0025   BUN 29 (H) 03/29/2022 1059   CREATININE 2.23 (H) 03/22/2023 0025   CALCIUM 8.9 03/22/2023 0025   GFRNONAA 35 (L) 03/22/2023 0025   GFRAA  10/26/2010 1143    >60        The eGFR has been calculated using the MDRD equation. This calculation has not been validated in all clinical situations. eGFR's persistently <60 mL/min signify possible Chronic Kidney Disease.    Renal function: CrCl cannot be calculated (Patient's most recent lab result is older than the maximum 21 days allowed.).  Clinical ASCVD: No  The ASCVD Risk score (Arnett DK, et al., 2019) failed to calculate for the following reasons:   The patient has a prior MI or stroke diagnosis  ASCVD risk factors include- [place]   ASSESSMENT & PLAN: Syd was seen today for diabetes and hypertension.  Diagnoses and all orders for this visit:  Type 2 diabetes mellitus without complication, without long-term current use of insulin (HCC)  Complications from uncontrolled diabetes -diabetic retinopathy leading to blindness, diabetic nephropathy leading to dialysis, decrease in circulation  decrease in sores or wound healing which may lead to amputations and increase of heart attack and stroke  A1C improved to 9.7 re address after ED or hospital d/c  Depression, unspecified depression type Flowsheet Row Office Visit from 06/01/2023 in [facility]  PHQ-9 Total Score 14      Refer to LCSW    Essential hypertension -Counseled on lifestyle modifications for blood pressure control including reduced dietary sodium, increased exercise, weight reduction and adequate sleep. Also, educated patient about the risk for cardiovascular events, stroke and heart attack. Also counseled patient about the importance of medication adherence. If you participate in smoking, it is important to stop using tobacco as this will increase the risks associated with uncontrolled blood pressure.  Minimize salt intake. Minimize alcohol intake Refer to cardiology   Sent out EMS  This note has been created with Education officer, environmental. Any transcriptional errors are unintentional.   Grayce Sessions, NP 06/01/2023, 4:39 PM

## 2023-06-02 MED ORDER — FUROSEMIDE 10 MG/ML IJ SOLN
80.0000 mg | Freq: Once | INTRAMUSCULAR | Status: AC
  Administered 2023-06-02: 80 mg via INTRAVENOUS
  Filled 2023-06-02: qty 8

## 2023-06-02 NOTE — ED Notes (Signed)
[facility] called. No ETA

## 2023-06-02 NOTE — Telephone Encounter (Signed)
I spoke with the patient's PCP over the phone.  She was calling because the patient had reported shortness of breath, while in the office he was hypertensive 171/101 mmHg.  I advised that it was beneficial for the patient to be evaluated in the ED as his shortness of breath and his accelerated hypertension he could be experiencing flash pulmonary edema.  Dr Thomasene Ripple   The above is Dr Mallory Shirk comment

## 2023-06-02 NOTE — ED Provider Notes (Signed)
[place] EMERGENCY DEPARTMENT AT [facility] Provider Note   CSN: [id number] Arrival date & time: 06/01/23  1746     History  Chief Complaint  Patient presents with   Shortness of Breath    Leroy Simmons is a 52 y.o. male.  HPI    This is a 52 year old male who presents with shortness of breath.  Patient reports 3 to 4-day history of shortness of breath and worsening lower extremity swelling.  He takes torsemide daily.  No recent changes in his medication.  Patient reports that he was at his doctor's office earlier today when he had acutely elevated blood pressure.  He reports dyspnea on exertion and lower extremity swelling.  He states "I feel like I am drowning."  No recent fevers or cough.  No chest pain.  Home Medications Prior to Admission medications   Medication Sig Start Date End Date Taking? Authorizing Provider  albuterol (VENTOLIN HFA) 108 (90 Base) MCG/ACT inhaler Inhale 2 puffs into the lungs every 6 (six) hours as needed for wheezing or shortness of breath. 01/13/23   Leslye Peer, MD  amLODipine (NORVASC) 10 MG tablet Take 1 tablet (10 mg total) by mouth daily. 03/22/23   Rhetta Mura, MD  apixaban (ELIQUIS) 5 MG TABS tablet Take 1 tablet (5mg ) by mouth twice daily. Patient not taking: Reported on 06/01/2023 03/22/23   Rhetta Mura, MD  aspirin EC 81 MG tablet Take 81 mg by mouth daily.    [provider]  atorvastatin (LIPITOR) 80 MG tablet Take 1 tablet (80 mg total) by mouth daily. 01/25/23 03/13/24  Arrien, York Ram, MD  blood glucose meter kit and supplies Dispense based on patient and insurance preference. Use up to four times daily as directed. (FOR ICD-10 E10.9, E11.9). 05/24/22   [place], Buren Kos, MD  insulin glargine (LANTUS) 100 unit/mL SOPN Inject 22 Units into the skin 2 (two) times daily. 03/22/23   Rhetta Mura, MD  insulin lispro (HUMALOG KWIKPEN) 100 UNIT/ML KwikPen Inject 5 Units into the skin 3  (three) times daily before meals. Patient taking differently: Inject 5 Units into the skin 3 (three) times daily. 02/09/23   Grayce Sessions, NP  isosorbide mononitrate (IMDUR) 30 MG 24 hr tablet Take 1 tablet (30 mg total) by mouth daily. 01/24/23 03/13/24  Arrien, York Ram, MD  lisinopril (ZESTRIL) 20 MG tablet Take 20 mg by mouth daily.    [provider]  metoprolol succinate (TOPROL-XL) 50 MG 24 hr tablet Take 50 mg by mouth daily. Take with or immediately following a meal.    [provider]  oxyCODONE (OXY IR/ROXICODONE) 5 MG immediate release tablet Take 1 tablet (5 mg total) by mouth every 4 (four) hours as needed for moderate pain. Patient not taking: Reported on 06/01/2023 03/22/23   Rhetta Mura, MD  polyethylene glycol powder (GLYCOLAX/MIRALAX) 17 GM/SCOOP powder Take 17 g by mouth 2 (two) times daily. Patient taking differently: Take 17 g by mouth daily as needed for severe constipation. 03/22/23   Rhetta Mura, MD  senna-docusate (SENOKOT-S) 8.6-50 MG tablet Take 1 tablet by mouth 2 (two) times daily. Patient taking differently: Take 1 tablet by mouth daily as needed for moderate constipation. 03/22/23   Rhetta Mura, MD  sulfamethoxazole-trimethoprim (BACTRIM DS) 800-160 MG tablet Take 1 tablet by mouth 2 (two) times daily. 05/27/23   Gilda Crease, MD  torsemide (DEMADEX) 20 MG tablet Take 2 tablets (40 mg total) by mouth 2 (two) times  daily. 02/08/23   Allayne Butcher, PA-C      Allergies    Patient has no known allergies.    Review of Systems   Review of Systems  Constitutional:  Negative for fever.  Respiratory:  Positive for shortness of breath.   Cardiovascular:  Positive for leg swelling. Negative for chest pain.  All other systems reviewed and are negative.   Physical Exam Updated Vital Signs BP (!) 152/85   Pulse 88   Temp 98.7 F (37.1 C) (Oral)   Resp 20   SpO2 98%  Physical Exam Vitals and nursing  note reviewed.  Constitutional:      Appearance: He is well-developed. He is obese.     Comments: Chronically ill-appearing, no acute distress  HENT:     Head: Normocephalic and atraumatic.  Eyes:     Pupils: Pupils are equal, round, and reactive to light.  Cardiovascular:     Rate and Rhythm: Normal rate and regular rhythm.     Heart sounds: Normal heart sounds. No murmur heard. Pulmonary:     Effort: Pulmonary effort is normal. No respiratory distress.     Breath sounds: Normal breath sounds. No wheezing.  Abdominal:     General: Bowel sounds are normal.     Palpations: Abdomen is soft.     Tenderness: There is no abdominal tenderness. There is no rebound.  Musculoskeletal:     Cervical back: Neck supple.     Comments: 2+ pitting bilateral lower extremity edema  Lymphadenopathy:     Cervical: No cervical adenopathy.  Skin:    General: Skin is warm and dry.  Neurological:     Mental Status: He is alert and oriented to person, place, and time.  Psychiatric:        Mood and Affect: Mood normal.     ED Results / Procedures / Treatments   Labs (all labs ordered are listed, but only abnormal results are displayed) Labs Reviewed  BASIC METABOLIC PANEL - Abnormal; Notable for the following components:      Result Value   CO2 20 (*)    Glucose, Bld 198 (*)    BUN 25 (*)    Creatinine, Ser 1.92 (*)    Calcium 8.5 (*)    GFR, Estimated 42 (*)    All other components within normal limits  CBC - Abnormal; Notable for the following components:   RBC 3.60 (*)    Hemoglobin 10.8 (*)    HCT 33.3 (*)    All other components within normal limits  BRAIN NATRIURETIC PEPTIDE - Abnormal; Notable for the following components:   B Natriuretic Peptide 175.3 (*)    All other components within normal limits  TROPONIN I (HIGH SENSITIVITY) - Abnormal; Notable for the following components:   Troponin I (High Sensitivity) 42 (*)    All other components within normal limits  TROPONIN I (HIGH  SENSITIVITY) - Abnormal; Notable for the following components:   Troponin I (High Sensitivity) 48 (*)    All other components within normal limits    EKG EKG Interpretation Date/Time:  Thursday June 01 2023 17:51:30 EDT Ventricular Rate:  91 PR Interval:  144 QRS Duration:  74 QT Interval:  364 QTC Calculation: 447 R Axis:   9  Text Interpretation: Normal sinus rhythm ST & T wave abnormality, consider lateral ischemia Abnormal ECG When compared with ECG of 13-Mar-2023 17:27, PREVIOUS ECG IS PRESENT Confirmed by Ross Marcus ([id number]) on 06/02/2023 2:27:55 AM  Radiology  DG Chest 2 View  Result Date: 06/01/2023 CLINICAL DATA:  Shortness of breath and elevated blood pressure. EXAM: CHEST - 2 VIEW COMPARISON:  March 13, 2023 FINDINGS: The cardiac silhouette is mildly enlarged and unchanged in size. Low lung volumes are seen with mildly increased interstitial lung markings. Mild prominence of the central pulmonary vasculature is also noted. There is no evidence of a pleural effusion or pneumothorax. The visualized skeletal structures are unremarkable. IMPRESSION: Low lung volumes with mild central pulmonary vascular congestion and mild interstitial edema. Electronically Signed   By: Aram Candela M.D.   On: 06/01/2023 19:35    Procedures .Critical Care  Performed by: Shon Baton, MD Authorized by: Shon Baton, MD   Critical care provider statement:    Critical care time (minutes):  31   Critical care was necessary to treat or prevent imminent or life-threatening deterioration of the following conditions: Pulmonary edema, volume overload requiring IV diuretic.   Critical care was time spent personally by me on the following activities:  Development of treatment plan with patient or surrogate, discussions with consultants, evaluation of patient's response to treatment, examination of patient, ordering and review of laboratory studies, ordering and review of radiographic  studies, ordering and performing treatments and interventions, pulse oximetry, re-evaluation of patient's condition and review of old charts     Medications Ordered in ED Medications  furosemide (LASIX) injection 80 mg (80 mg Intravenous Given 06/02/23 0327)    ED Course/ Medical Decision Making/ A&P                                 Medical Decision Making Risk Prescription drug management.   This patient presents to the ED for concern of shortness of breath, this involves an extensive number of treatment options, and is a complaint that carries with it a high risk of complications and morbidity.  I considered the following differential and admission for this acute, potentially life threatening condition.  The differential diagnosis includes pneumonia, pneumothorax, PE, pulmonary edema, ACS  MDM:    This is a 52 year old male who presents with shortness of breath.  He is overall nontoxic and vital signs are notable for blood pressure of 152/85.  Reports that this was much higher at the office.  States he feels somewhat better than he did at the office.  He does appear acutely volume overloaded with significant lower extremity edema.  He is in no respiratory distress.  Chest x-ray does show some mild pulmonary edema.  EKG is nonischemic.  Troponin is stably elevated and flat in the 40s.  Other lab work is largely reassuring.  Patient was given 80 mg of IV Lasix.  He ambulated and maintained his pulse ox.  Recommend that he continue his torsemide at home and avoid salt in his diet.  Would recommend close follow-up with his primary doctor.  (Labs, imaging, consults)  Labs: I Ordered, and personally interpreted labs.  The pertinent results include: CBC, CMP, BNP, troponin x 2  Imaging Studies ordered: I ordered imaging studies including chest x-ray I independently visualized and interpreted imaging. I agree with the radiologist interpretation  Additional history obtained from chart  review.  External records from outside source obtained and reviewed including prior evaluations  Cardiac Monitoring: The patient was maintained on a cardiac monitor.  If on the cardiac monitor, I personally viewed and interpreted the cardiac monitored which showed an underlying rhythm of:  Sinus rhythm  Reevaluation: After the interventions noted above, I reevaluated the patient and found that they have :improved  Social Determinants of Health:  lives independently  Disposition: Discharge  Co morbidities that complicate the patient evaluation  Past Medical History:  Diagnosis Date   Acute gangrenous appendicitis 09/25/2022   Anxiety    CHF (congestive heart failure) (HCC)    Depression    Diabetes mellitus without complication (HCC)    Hypertension      Medicines Meds ordered this encounter  Medications   furosemide (LASIX) injection 80 mg    I have reviewed the patients home medicines and have made adjustments as needed  Problem List / ED Course: Problem List Items Addressed This Visit   None Visit Diagnoses     Other hypervolemia    -  Primary   Lower extremity edema                       Final Clinical Impression(s) / ED Diagnoses Final diagnoses:  Other hypervolemia  Lower extremity edema    Rx / DC Orders ED Discharge Orders     None         Shon Baton, MD 06/02/23 [phone]

## 2023-06-02 NOTE — ED Notes (Signed)
Ambulated pt with pulse ox. Pt maintained a 97% and a pulse rate between 99-105. Pt did state he "felt a little swimmy headed".

## 2023-06-02 NOTE — Discharge Instructions (Signed)
You were seen today for shortness of breath and lower extremity edema.  Your workup shows some mild pulmonary edema.  You were given a dose of Lasix on top of your home dose of torsemide.  Make sure that you are taking your torsemide as instructed and follow-up closely with your primary physician if not improving.  Avoid salt in your diet.

## 2023-06-07 ENCOUNTER — Other Ambulatory Visit (HOSPITAL_COMMUNITY): Payer: Self-pay

## 2023-06-08 ENCOUNTER — Other Ambulatory Visit (HOSPITAL_COMMUNITY): Payer: Self-pay

## 2023-06-09 ENCOUNTER — Ambulatory Visit (INDEPENDENT_AMBULATORY_CARE_PROVIDER_SITE_OTHER): Payer: 59 | Admitting: Primary Care

## 2023-06-22 ENCOUNTER — Ambulatory Visit: Payer: 59 | Admitting: Dietician

## 2023-07-11 NOTE — Progress Notes (Deleted)
Cardiology Office Note   Date:  07/11/2023  ID:  Leroy Simmons, DOB July 07, 1971, MRN [id number] PCP:  Leroy Sessions, NP [facility] Cardiologist: Leroy Poisson, MD  Reason for visit: Follow-up  History of Present Illness    Leroy Simmons is a 52 y.o. male with a hx of CAD, chronic diastolic heart failure, hypertension, CKD, type 2 diabetes, depression, anxiety, left LE DVT on Eliquis.   He was hospitalized from 03/17/2022 to 03/24/2022 in the setting of acute on chronic diastolic heart failure.  Coronary CTA demonstrated coronary calcium score 431. Coronary FFR showed significant hemodynamically flow-limiting lesion in the LAD. Subsequent cardiac catheterization revealed 50% D2, 40% mLAD, and 25% RI, severely elevated LVEDP, 33 mmHg.   We saw him in June 2023 for hospital follow-up.  He had ongoing dyspnea on exertion, PND, orthopnea, weight gain, bilateral extremity edema.  Furosemide was changed to torsemide.  For intermittent chest tightness, started on Imdur 15 mg daily.  Recommend follow-up in 1 week.  He last saw cardiology in May 2024 at the heart failure clinic after hospital admission for heart failure in April 2024.  Diuresed with IV Lasix.  He was still short of breath with activity and had abdominal and lower extremity edema.  Admitted to eating fast food often.  Torsemide was increased to 40 mg twice daily.  Right heart cath recommended to evaluate for pulmonary hypertension.  Recommend sleep study.  Today, ***  Acute on chronic diastolic heart failure Pulmonary hypertension -Echo 4/24 EF 60-65%. RV normal size w/ normal systolic function  -***  Coronary artery disease -mild-mod by cath 6/23, 40% mLAD, 50% 2nd Diag and 25% Ramus lesion  -***  Hypertension -*** -Goal BP is <130/80.  Recommend DASH diet (high in vegetables, fruits, low-fat dairy products, whole grains, poultry, fish, and nuts and low in sweets, sugar-sweetened beverages, and red meats),  salt restriction and increase physical activity.  Hyperlipidemia -*** -Recommend cholesterol lowering diets - Mediterranean diet, DASH diet, vegetarian diet, low-carbohydrate diet and avoidance of trans fats.  Discussed healthier choice substitutes.  Nuts, high-fiber foods, and fiber supplements may also improve lipids.    Obesity -Even a 5-10% weight loss can have cardiovascular benefits.   -Recommend moderate intensity activity for 30 minutes 5 days/week and the DASH diet.  Tobacco use  -Recommend tobacco cessation.  Reviewed physiologic effects of nicotine and the immediate-eventual benefits of quitting including improvement in cough/breathing and reduction in cardiovascular events.  Discussed quitting tips such as removing triggers and getting support from family/friends and Quitline [place]. -USPSTF recommends one-time screening for abdominal aortic aneurysm (AAA) by ultrasound in men 78 -40 years old who have ever smoked.      Disposition - Follow-up in ***     Objective / Physical Exam   EKG today: ***  Vital signs:  There were no vitals taken for this visit.    GEN: No acute distress NECK: No carotid bruits CARDIAC: ***RRR, no murmurs RESPIRATORY:  Clear to auscultation without rales, wheezing or rhonchi  EXTREMITIES: No edema  Assessment and Plan   ***   {Are you ordering a CV Procedure (e.g. stress test, cath, DCCV, TEE, etc)?   Press F2        :[id number]}    Signed, Leroy Simmons  07/11/2023 [facility]

## 2023-07-12 ENCOUNTER — Ambulatory Visit: Payer: 59 | Attending: Physician Assistant | Admitting: Physician Assistant

## 2023-07-12 DIAGNOSIS — I5033 Acute on chronic diastolic (congestive) heart failure: Secondary | ICD-10-CM

## 2023-07-12 DIAGNOSIS — I2510 Atherosclerotic heart disease of native coronary artery without angina pectoris: Secondary | ICD-10-CM

## 2023-07-12 DIAGNOSIS — I10 Essential (primary) hypertension: Secondary | ICD-10-CM

## 2023-07-24 ENCOUNTER — Ambulatory Visit (INDEPENDENT_AMBULATORY_CARE_PROVIDER_SITE_OTHER): Payer: Self-pay

## 2023-07-24 NOTE — Telephone Encounter (Signed)
Chief Complaint: Difficulty swallowing liquids, solid Symptoms: lungs feels full, SOB in the middle of the night, swelling to legs Frequency: Ongoing since having the flu 1 week ago Pertinent Negatives: Patient denies chest pain, other symptoms Disposition: [x] ED /[] Urgent Care (no appt availability in office) / [] Appointment(In office/virtual)/ []  [facility]/ [] Home Care/ [] Refused Recommended Disposition /[] [facility]/ []  Follow-up with PCP Additional Notes: Patient says since having the flu, he has difficulty swallowing his saliva, liquids, food chewed up, it all causes coughing and he wonders if it's because of the flu he had 1 week ago. Reports swelling, waking up around 0400-0500 feeling like he's drowning with fluid in his lungs and SOB. He denies SOB at this time. Advised ED, he verbalized understanding.    Reason for Disposition  SEVERE difficulty swallowing (e.g., drooling or spitting, can't swallow water)  Answer Assessment - Initial Assessment Questions 1. DESCRIPTION: "Tell me more about this problem." "Are you  having trouble swallowing liquids, solids, or both?" "Any trouble with swallowing saliva (spit)?"     Swallowing liquids, saliva, solids 2. SEVERITY: "How bad is the swallowing difficulty?"  (e.g., Scale 1-10; or mild, moderate, severe)   - MILD (0-3): Occasional swallowing difficulty; has trouble swallowing certain types of foods or liquids.   - MODERATE (4-7): Frequent swallowing difficulty; only able to swallow small amounts of foods and fluids.   - SEVERE (8-10): Unable to swallow any foods, fluids, or saliva; sensation of "lump in throat" or "something stuck in throat", and frequent drooling or spitting may be present.     Moderate-severe 3. ONSET: "When did the swallowing problems begin?"      1 week 4. CAUSE: "What do you think is causing the problem?"  (e.g., dry mouth, food or pill stuck in throat, mouth pain, sore throat, progression of  disease process such as dementia or Parkinson's disease).      Possibly flu, not sure 5. CHRONIC or RECURRENT: "Is this a new problem for you?"  If No, ask: "How long have you had this problem?" (e.g., days, weeks, months)      New since flu 6. OTHER SYMPTOMS: "Do you have any other symptoms?" (e.g., chest pain, difficulty breathing, mouth sores, sore throat, swollen tongue, chest pain)     SOB in the morning around 4-5 am (feels like drowning), cough, lungs feeling full  Protocols used: Swallowing Difficulty-A-AH

## 2023-07-24 NOTE — Telephone Encounter (Signed)
Will forward to provider  

## 2023-07-25 NOTE — Telephone Encounter (Signed)
Noted recc. To go to ED reviewed chart did not go. Keep speciality appt

## 2023-07-27 ENCOUNTER — Other Ambulatory Visit (HOSPITAL_COMMUNITY): Payer: Self-pay

## 2023-08-03 ENCOUNTER — Other Ambulatory Visit: Payer: Self-pay

## 2023-08-11 ENCOUNTER — Emergency Department (HOSPITAL_COMMUNITY)
Admission: EM | Admit: 2023-08-11 | Discharge: 2023-08-11 | Disposition: A | Discharge status: Home / Self Care | Payer: 59 | Attending: Emergency Medicine | Admitting: Emergency Medicine

## 2023-08-11 ENCOUNTER — Other Ambulatory Visit (HOSPITAL_COMMUNITY): Payer: Self-pay

## 2023-08-11 DIAGNOSIS — L0889 Other specified local infections of the skin and subcutaneous tissue: Secondary | ICD-10-CM | POA: Diagnosis not present

## 2023-08-11 DIAGNOSIS — E119 Type 2 diabetes mellitus without complications: Secondary | ICD-10-CM | POA: Diagnosis not present

## 2023-08-11 DIAGNOSIS — Z7982 Long term (current) use of aspirin: Secondary | ICD-10-CM | POA: Diagnosis not present

## 2023-08-11 DIAGNOSIS — L089 Local infection of the skin and subcutaneous tissue, unspecified: Secondary | ICD-10-CM | POA: Diagnosis not present

## 2023-08-11 DIAGNOSIS — Z794 Long term (current) use of insulin: Secondary | ICD-10-CM | POA: Diagnosis not present

## 2023-08-11 MED ORDER — DOXYCYCLINE HYCLATE 100 MG PO CAPS
100.0000 mg | ORAL_CAPSULE | Freq: Two times a day (BID) | ORAL | 0 refills | Status: AC
Start: 2023-08-11 — End: 2023-08-21
  Filled 2023-08-11: qty 20, 10d supply, fill #0

## 2023-08-11 MED ORDER — CHLORHEXIDINE GLUCONATE 4 % EX SOLN
Freq: Every day | CUTANEOUS | 0 refills | Status: DC | PRN
Start: 2023-08-11 — End: 2024-04-25
  Filled 2023-08-11: qty 236, fill #0

## 2023-08-11 NOTE — ED Triage Notes (Signed)
Pt. Stated, I think I have an infection in my back a boil or something Im not sure. Its been there for a week.

## 2023-08-11 NOTE — ED Provider Notes (Signed)
EMERGENCY DEPARTMENT AT [facility] Provider Note   CSN: [id number] Arrival date & time: 08/11/23  0815     History  Chief Complaint  Patient presents with   Abscess   Back Pain    Leroy Simmons is a 52 y.o. male insulin-dependent type 2 diabetes presents with complaints of abscess x 3 that have worsened over the past week.  He endorses associated pruritus and pain particularly when moving his torso.  Reports he has had similar symptoms on his face and back several months ago that were both drained.  He applied Betadine to 1 abscess last p.m.. Admits to picking at the sites.   HPI     Home Medications Prior to Admission medications   Medication Sig Start Date End Date Taking? Authorizing Provider  chlorhexidine (HIBICLENS) 4 % external liquid Apply topically daily as needed. 08/11/23  Yes Halford Decamp, PA-C  doxycycline (VIBRAMYCIN) 100 MG capsule Take 1 capsule (100 mg total) by mouth 2 (two) times daily for 10 days. 08/11/23 08/21/23 Yes Halford Decamp, PA-C  albuterol (VENTOLIN HFA) 108 (90 Base) MCG/ACT inhaler Inhale 2 puffs into the lungs every 6 (six) hours as needed for wheezing or shortness of breath. 01/13/23   Leslye Peer, MD  amLODipine (NORVASC) 10 MG tablet Take 1 tablet (10 mg total) by mouth daily. 03/22/23   Rhetta Mura, MD  apixaban (ELIQUIS) 5 MG TABS tablet Take 1 tablet (5mg ) by mouth twice daily. Patient not taking: Reported on 06/01/2023 03/22/23   Rhetta Mura, MD  aspirin EC 81 MG tablet Take 81 mg by mouth daily.    [provider]  atorvastatin (LIPITOR) 80 MG tablet Take 1 tablet (80 mg total) by mouth daily. 01/25/23 03/13/24  Arrien, York Ram, MD  blood glucose meter kit and supplies Dispense based on patient and insurance preference. Use up to four times daily as directed. (FOR ICD-10 E10.9, E11.9). 05/24/22   [place], Buren Kos, MD  insulin glargine (LANTUS) 100 unit/mL SOPN Inject 22 Units  into the skin 2 (two) times daily. 03/22/23   Rhetta Mura, MD  insulin lispro (HUMALOG KWIKPEN) 100 UNIT/ML KwikPen Inject 5 Units into the skin 3 (three) times daily before meals. Patient taking differently: Inject 5 Units into the skin 3 (three) times daily. 02/09/23   Grayce Sessions, NP  isosorbide mononitrate (IMDUR) 30 MG 24 hr tablet Take 1 tablet (30 mg total) by mouth daily. 01/24/23 03/13/24  Arrien, York Ram, MD  lisinopril (ZESTRIL) 20 MG tablet Take 20 mg by mouth daily.    [provider]  metoprolol succinate (TOPROL-XL) 50 MG 24 hr tablet Take 50 mg by mouth daily. Take with or immediately following a meal.    [provider]  oxyCODONE (OXY IR/ROXICODONE) 5 MG immediate release tablet Take 1 tablet (5 mg total) by mouth every 4 (four) hours as needed for moderate pain. Patient not taking: Reported on 06/01/2023 03/22/23   Rhetta Mura, MD  polyethylene glycol powder (GLYCOLAX/MIRALAX) 17 GM/SCOOP powder Take 17 g by mouth 2 (two) times daily. Patient taking differently: Take 17 g by mouth daily as needed for severe constipation. 03/22/23   Rhetta Mura, MD  senna-docusate (SENOKOT-S) 8.6-50 MG tablet Take 1 tablet by mouth 2 (two) times daily. Patient taking differently: Take 1 tablet by mouth daily as needed for moderate constipation. 03/22/23   Rhetta Mura, MD  sulfamethoxazole-trimethoprim (BACTRIM DS) 800-160 MG tablet Take 1 tablet by mouth 2 (  two) times daily. 05/27/23   Gilda Crease, MD  torsemide (DEMADEX) 20 MG tablet Take 2 tablets (40 mg total) by mouth 2 (two) times daily. 02/08/23   Allayne Butcher, PA-C      Allergies    Patient has no known allergies.    Review of Systems   Review of Systems  Skin:  Positive for wound.    Physical Exam Updated Vital Signs BP 136/88   Pulse 100   Temp 98.8 F (37.1 C) (Oral)   Resp 17   Ht 5\' 8"  (1.727 m)   Wt 104.3 kg   SpO2 100%   BMI 34.97 kg/m   Physical Exam Vitals and nursing note reviewed.  Constitutional:      General: He is not in acute distress.    Appearance: He is well-developed.  HENT:     Head: Normocephalic and atraumatic.  Eyes:     Conjunctiva/sclera: Conjunctivae normal.  Cardiovascular:     Rate and Rhythm: Normal rate and regular rhythm.     Heart sounds: No murmur heard. Pulmonary:     Effort: Pulmonary effort is normal. No respiratory distress.     Breath sounds: Normal breath sounds.  Abdominal:     Palpations: Abdomen is soft.     Tenderness: There is no abdominal tenderness.  Musculoskeletal:        General: No swelling.     Cervical back: Neck supple.  Skin:    General: Skin is warm and dry.     Capillary Refill: Capillary refill takes less than 2 seconds.     Comments: Multiple erythematous lesions with eschar as seen in photo below. Lower left flank lesion with appreciable warmth and induration. No fluctuance or crepitus.  Neurological:     Mental Status: He is alert and oriented to person, place, and time.  Psychiatric:        Mood and Affect: Mood normal.     ED Results / Procedures / Treatments   Labs (all labs ordered are listed, but only abnormal results are displayed) Labs Reviewed - No data to display  EKG None  Radiology No results found.  Procedures Procedures    Medications Ordered in ED Medications - No data to display  ED Course/ Medical Decision Making/ A&P                                 Medical Decision Making REGENALD MATURO is a 52 y.o. male insulin-dependent type 2 diabetes presents with complaints of abscess x 3 that have worsened over the past week.  Differential includes uncomplicated cellulitis, abscess, dermatitis, insect bite, SJS, necrotizing fasciitis.  No additional historians.  External records include prior ED note for similar symptoms in 8/24 and 6/24.  No labs or imaging warranted at this time.  He is hemodynamically stable, afebrile.  No reported  insect bites or new medications.  On exam there is no crepitus or fluctuance.  Using shared decision making, decision was made to discharge patient on 10-day course of doxycycline and chlorhexidine skin wash with plan for follow-up in 3 days for skin check.  Decision will be made at that time for possible I&D vs continued antibiotic without I&D.  No critical interventions, consultations, or cardiac monitoring indicated.  And is safe for discharge with clear return precautions.            Final Clinical Impression(s) / ED Diagnoses Final  diagnoses:  Skin infection    Rx / DC Orders ED Discharge Orders          Ordered    doxycycline (VIBRAMYCIN) 100 MG capsule  2 times daily        08/11/23 0937    chlorhexidine (HIBICLENS) 4 % external liquid  Daily PRN        08/11/23 0937              Halford Decamp, PA-C 08/11/23 1035    Eber Hong, MD 08/11/23 1956

## 2023-08-11 NOTE — Discharge Instructions (Addendum)
It was a pleasure taking care of you this morning.  You were evaluated in the emergency department for concerns of skin infection.  You are being discharged on an antibiotic and skin cleanser.  Please be sure to complete the full course of the antibiotics. Please use the skin cleanser daily as well.  Please return to the emergency department in 3 days for wound check or if you experience any new or worsening symptoms including fevers, chills, or worsening pain.

## 2023-08-13 NOTE — Progress Notes (Deleted)
  Cardiology Office Note   Date:  08/13/2023  ID:  Leroy Simmons, DOB Feb 01, 1971, MRN [id number] PCP:  Grayce Sessions, NP [facility] Cardiologist: Parke Poisson, MD  Reason for visit: Pulmonary hypertension  History of Present Illness    Leroy Simmons is a 52 y.o. 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  Was admitted in April 2024 with acute on chronic diastolic heart failure and hypertension.  Diuresed with IV Lasix and transition to p.o. torsemide.  Goal-directed medical therapy limited by renal function. He was diagnosed w/ an acute LE DVT during admission, left peroneal vein. Treated w/ Eliquis.  He was discharged home on 01/24/23, D/c wt 218 lb.  He followed up with the heart failure clinic in early May 2024.  He was up approximately 15 pounds.Torsemide increased to 40 mg twice daily.  Right heart cath recommended to workup pulmonary hypertension the patient agreed - procedure was cancelled.  Sleep study recommended.  Patient went to the ED August 29 with shortness of breath and worsening lower extremity swelling.  Patient was hypertensive.  Given IV Lasix 80 mg.  Discharged home.  Today, ***  ?pulm HTN  Chronic diastolic heart failure -***  Coronary artery disease -mild-mod by cath 6/23, 40% mLAD, 50% 2nd Diag and 25% Ramus lesion   Hypertension -*** -Goal BP is <130/80.  Recommend DASH diet (high in vegetables, fruits, low-fat dairy products, whole grains, poultry, fish, and nuts and low in sweets, sugar-sweetened beverages, and red meats), salt restriction and increase physical activity.  Hyperlipidemia -*** -Recommend cholesterol lowering diets - Mediterranean diet, DASH diet, vegetarian diet, low-carbohydrate diet and avoidance of trans fats.  Discussed healthier choice substitutes.  Nuts, high-fiber foods,  and fiber supplements may also improve lipids.    Obesity -Even a 5-10% weight loss can have cardiovascular benefits.   -Recommend moderate intensity activity for 30 minutes 5 days/week and the DASH diet.  Tobacco use  -Recommend tobacco cessation.  Reviewed physiologic effects of nicotine and the immediate-eventual benefits of quitting including improvement in cough/breathing and reduction in cardiovascular events.  Discussed quitting tips such as removing triggers and getting support from family/friends and Quitline [place]. -USPSTF recommends one-time screening for abdominal aortic aneurysm (AAA) by ultrasound in men 52 -4 years old who have ever smoked.      Disposition - Follow-up in ***     Objective / Physical Exam   EKG today: ***  Vital signs:  There were no vitals taken for this visit.    GEN: No acute distress NECK: No carotid bruits CARDIAC: ***RRR, no murmurs RESPIRATORY:  Clear to auscultation without rales, wheezing or rhonchi  EXTREMITIES: No edema  Assessment and Plan   ***   {Are you ordering a CV Procedure (e.g. stress test, cath, DCCV, TEE, etc)?   Press F2        :[id number]}    Signed, Bernette Mayers  08/13/2023 [facility]

## 2023-08-16 ENCOUNTER — Ambulatory Visit: Payer: 59 | Attending: Physician Assistant | Admitting: Physician Assistant

## 2023-08-16 DIAGNOSIS — I5032 Chronic diastolic (congestive) heart failure: Secondary | ICD-10-CM

## 2023-08-16 DIAGNOSIS — I2510 Atherosclerotic heart disease of native coronary artery without angina pectoris: Secondary | ICD-10-CM

## 2023-08-16 DIAGNOSIS — I10 Essential (primary) hypertension: Secondary | ICD-10-CM

## 2023-08-18 ENCOUNTER — Other Ambulatory Visit (HOSPITAL_COMMUNITY): Payer: Self-pay

## 2023-08-21 ENCOUNTER — Other Ambulatory Visit (HOSPITAL_COMMUNITY): Payer: Self-pay

## 2023-08-21 MED ORDER — LISINOPRIL 20 MG PO TABS
20.0000 mg | ORAL_TABLET | Freq: Every day | ORAL | 11 refills | Status: DC
  Filled 2023-08-21: qty 30, 30d supply, fill #0

## 2023-08-23 ENCOUNTER — Other Ambulatory Visit: Payer: Self-pay

## 2023-08-30 ENCOUNTER — Other Ambulatory Visit (HOSPITAL_COMMUNITY): Payer: Self-pay

## 2023-09-04 ENCOUNTER — Other Ambulatory Visit (HOSPITAL_COMMUNITY): Payer: Self-pay

## 2023-09-09 ENCOUNTER — Emergency Department (HOSPITAL_COMMUNITY)
Admission: EM | Admit: 2023-09-09 | Discharge: 2023-09-09 | Disposition: A | Discharge status: Home / Self Care | Payer: 59 | Attending: Emergency Medicine | Admitting: Emergency Medicine

## 2023-09-09 ENCOUNTER — Emergency Department (HOSPITAL_COMMUNITY): Payer: 59

## 2023-09-09 ENCOUNTER — Other Ambulatory Visit: Payer: Self-pay

## 2023-09-09 ENCOUNTER — Encounter (HOSPITAL_COMMUNITY): Payer: Self-pay | Admitting: *Deleted

## 2023-09-09 DIAGNOSIS — Z79899 Other long term (current) drug therapy: Secondary | ICD-10-CM | POA: Insufficient documentation

## 2023-09-09 DIAGNOSIS — L02222 Furuncle of back [any part, except buttock]: Secondary | ICD-10-CM

## 2023-09-09 DIAGNOSIS — E119 Type 2 diabetes mellitus without complications: Secondary | ICD-10-CM

## 2023-09-09 DIAGNOSIS — E1165 Type 2 diabetes mellitus with hyperglycemia: Secondary | ICD-10-CM | POA: Diagnosis not present

## 2023-09-09 DIAGNOSIS — R0789 Other chest pain: Secondary | ICD-10-CM | POA: Diagnosis not present

## 2023-09-09 DIAGNOSIS — I10 Essential (primary) hypertension: Secondary | ICD-10-CM | POA: Insufficient documentation

## 2023-09-09 DIAGNOSIS — L0212 Furuncle of neck: Secondary | ICD-10-CM | POA: Insufficient documentation

## 2023-09-09 DIAGNOSIS — R0602 Shortness of breath: Secondary | ICD-10-CM | POA: Insufficient documentation

## 2023-09-09 DIAGNOSIS — I517 Cardiomegaly: Secondary | ICD-10-CM | POA: Diagnosis not present

## 2023-09-09 DIAGNOSIS — Z7982 Long term (current) use of aspirin: Secondary | ICD-10-CM | POA: Insufficient documentation

## 2023-09-09 DIAGNOSIS — Z794 Long term (current) use of insulin: Secondary | ICD-10-CM | POA: Insufficient documentation

## 2023-09-09 DIAGNOSIS — R079 Chest pain, unspecified: Secondary | ICD-10-CM | POA: Diagnosis not present

## 2023-09-09 DIAGNOSIS — R059 Cough, unspecified: Secondary | ICD-10-CM | POA: Diagnosis not present

## 2023-09-09 DIAGNOSIS — R0989 Other specified symptoms and signs involving the circulatory and respiratory systems: Secondary | ICD-10-CM | POA: Diagnosis not present

## 2023-09-09 LAB — CBC
HCT: 36.4 % — ABNORMAL LOW (ref 39.0–52.0)
Hemoglobin: 12.1 g/dL — ABNORMAL LOW (ref 13.0–17.0)
MCH: 29.8 pg (ref 26.0–34.0)
MCHC: 33.2 g/dL (ref 30.0–36.0)
MCV: 89.7 fL (ref 80.0–100.0)
Platelets: 247 10*3/uL (ref 150–400)
RBC: 4.06 MIL/uL — ABNORMAL LOW (ref 4.22–5.81)
RDW: 12.3 % (ref 11.5–15.5)
WBC: 10.5 10*3/uL (ref 4.0–10.5)
nRBC: 0 % (ref 0.0–0.2)

## 2023-09-09 LAB — HEMOGLOBIN A1C
Hgb A1c MFr Bld: 10.8 % — ABNORMAL HIGH (ref 4.8–5.6)
Mean Plasma Glucose: 263.26 mg/dL

## 2023-09-09 LAB — BASIC METABOLIC PANEL
Anion gap: 10 (ref 5–15)
BUN: 29 mg/dL — ABNORMAL HIGH (ref 6–20)
CO2: 20 mmol/L — ABNORMAL LOW (ref 22–32)
Calcium: 8.5 mg/dL — ABNORMAL LOW (ref 8.9–10.3)
Chloride: 102 mmol/L (ref 98–111)
Creatinine, Ser: 1.97 mg/dL — ABNORMAL HIGH (ref 0.61–1.24)
GFR, Estimated: 41 mL/min — ABNORMAL LOW (ref >60)
Glucose, Bld: 426 mg/dL — ABNORMAL HIGH (ref 70–99)
Potassium: 4.2 mmol/L (ref 3.5–5.1)
Sodium: 132 mmol/L — ABNORMAL LOW (ref 135–145)

## 2023-09-09 LAB — TROPONIN I (HIGH SENSITIVITY)
Troponin I (High Sensitivity): 37 ng/L — ABNORMAL HIGH (ref <18)
Troponin I (High Sensitivity): 39 ng/L — ABNORMAL HIGH (ref <18)

## 2023-09-09 LAB — BRAIN NATRIURETIC PEPTIDE: B Natriuretic Peptide: 171 pg/mL — ABNORMAL HIGH (ref 0.0–100.0)

## 2023-09-09 MED ORDER — INSULIN ASPART 100 UNIT/ML IJ SOLN
10.0000 [IU] | Freq: Once | INTRAMUSCULAR | Status: AC
  Administered 2023-09-09: 10 [IU] via SUBCUTANEOUS

## 2023-09-09 MED ORDER — CLINDAMYCIN HCL 150 MG PO CAPS: 150.0000 mg | ORAL_CAPSULE | Freq: Four times a day (QID) | ORAL | 0 refills | Status: DC

## 2023-09-09 MED ORDER — INSULIN ASPART 100 UNIT/ML IJ SOLN: 10.0000 [IU] | Freq: Once | INTRAMUSCULAR | Status: DC

## 2023-09-09 MED ORDER — ACETAMINOPHEN 500 MG PO TABS
1000.0000 mg | ORAL_TABLET | Freq: Once | ORAL | Status: AC
  Administered 2023-09-09: 1000 mg via ORAL
  Filled 2023-09-09: qty 2

## 2023-09-09 MED ORDER — INSULIN GLARGINE 100 UNITS/ML SOLOSTAR PEN: 22.0000 [IU] | PEN_INJECTOR | Freq: Two times a day (BID) | SUBCUTANEOUS | 5 refills | Status: DC

## 2023-09-09 NOTE — ED Triage Notes (Signed)
The pt is c/o chest pain for one day and he has a boil on the back of his neck for weeks

## 2023-09-09 NOTE — ED Notes (Signed)
Patient reports that he is out of his Lantus and has been for 5 days, unable to obtain refill through his primary care. ED provider sent refill to patient's pharmacy and education provided to follow up with primary care as soon as possible for maintenance.

## 2023-09-09 NOTE — ED Notes (Signed)
Attempted for labs x2. Phlebotomy notified.

## 2023-09-09 NOTE — Discharge Instructions (Addendum)
Thank you for letting us evaluate you today.  Your heart enzymes are negative for acute injury to the heart.  Your chest x-ray was significant for mild fluid per your baseline.  The boils on the back appear mildly infected.  We will give you a short 5-day course of clindamycin (antibiotic) that you need to take the entire course of.  This may cause mild GI upset.  You may take probiotic to avoid this.  I also recommend that you increase your Lantus to 25 units as your sugars are not well-controlled.  Your A1c is 10.8.  You need to follow-up with your primary care provider for sugar recheck and management as your sugars remain high as well as wound recheck.  Please return to emergency department if you experience worsening of symptoms, shortness of breath, altered mentation, seizures.

## 2023-09-09 NOTE — ED Provider Notes (Signed)
[place] EMERGENCY DEPARTMENT AT [facility] Provider Note   CSN: [id number] Arrival date & time: 09/09/23  1505     History {Add pertinent medical, surgical, social history, OB history to HPI:1} Chief Complaint  Patient presents with  . Chest Pain  . Abscess    Leroy Simmons is a 52 y.o. male with past medical history of HTN, insulin-dependent type 2 diabetes, MRSA presents to emergency department for evaluation of constant chest pain that moves from left to right side of his chest with associated shortness of breath that started yesterday.  He describes pain as a "pain and pressure".  He reports that laying still on his right side makes the pain better and movement makes it worse.  He also complains of multiple boils on the back of his back that are tender.  He denies drainage, fevers.  He was seen on 08/11/23 for similar infected appearing boils on his back and prescribed doxy. He states that he took entire course  The history is provided by the patient. No language interpreter was used.  Chest Pain Associated symptoms: no abdominal pain, no cough, no dizziness, no fatigue, no fever, no headache, no nausea, no numbness, no palpitations, no shortness of breath, no vomiting and no weakness   Abscess Associated symptoms: no fatigue, no fever, no headaches, no nausea and no vomiting      Home Medications Prior to Admission medications   Medication Sig Start Date End Date Taking? Authorizing Provider  albuterol (VENTOLIN HFA) 108 (90 Base) MCG/ACT inhaler Inhale 2 puffs into the lungs every 6 (six) hours as needed for wheezing or shortness of breath. 01/13/23   Leslye Peer, MD  amLODipine (NORVASC) 10 MG tablet Take 1 tablet (10 mg total) by mouth daily. 03/22/23   Rhetta Mura, MD  apixaban (ELIQUIS) 5 MG TABS tablet Take 1 tablet (5mg ) by mouth twice daily. Patient not taking: Reported on 06/01/2023 03/22/23   Rhetta Mura, MD  aspirin EC 81 MG tablet  Take 81 mg by mouth daily.    [provider]  atorvastatin (LIPITOR) 80 MG tablet Take 1 tablet (80 mg total) by mouth daily. 01/25/23 03/13/24  Arrien, York Ram, MD  blood glucose meter kit and supplies Dispense based on patient and insurance preference. Use up to four times daily as directed. (FOR ICD-10 E10.9, E11.9). 05/24/22   [place], Buren Kos, MD  chlorhexidine (HIBICLENS) 4 % external liquid Apply topically daily as needed. 08/11/23   Halford Decamp, PA-C  insulin glargine (LANTUS) 100 unit/mL SOPN Inject 22 Units into the skin 2 (two) times daily. 03/22/23   Rhetta Mura, MD  insulin lispro (HUMALOG KWIKPEN) 100 UNIT/ML KwikPen Inject 5 Units into the skin 3 (three) times daily before meals. Patient taking differently: Inject 5 Units into the skin 3 (three) times daily. 02/09/23   Grayce Sessions, NP  isosorbide mononitrate (IMDUR) 30 MG 24 hr tablet Take 1 tablet (30 mg total) by mouth daily. 01/24/23 03/13/24  Arrien, York Ram, MD  lisinopril (ZESTRIL) 20 MG tablet Take 20 mg by mouth daily.    [provider]  lisinopril (ZESTRIL) 20 MG tablet Take 1 tablet (20 mg total) by mouth daily. 10/19/22   Mosetta Pigeon, MD  metoprolol succinate (TOPROL-XL) 50 MG 24 hr tablet Take 50 mg by mouth daily. Take with or immediately following a meal.    [provider]  oxyCODONE (OXY IR/ROXICODONE) 5 MG immediate release tablet Take 1 tablet (5  mg total) by mouth every 4 (four) hours as needed for moderate pain. Patient not taking: Reported on 06/01/2023 03/22/23   Rhetta Mura, MD  polyethylene glycol powder (GLYCOLAX/MIRALAX) 17 GM/SCOOP powder Take 17 g by mouth 2 (two) times daily. Patient taking differently: Take 17 g by mouth daily as needed for severe constipation. 03/22/23   Rhetta Mura, MD  senna-docusate (SENOKOT-S) 8.6-50 MG tablet Take 1 tablet by mouth 2 (two) times daily. Patient taking differently: Take 1 tablet by mouth  daily as needed for moderate constipation. 03/22/23   Rhetta Mura, MD  sulfamethoxazole-trimethoprim (BACTRIM DS) 800-160 MG tablet Take 1 tablet by mouth 2 (two) times daily. 05/27/23   Gilda Crease, MD  torsemide (DEMADEX) 20 MG tablet Take 2 tablets (40 mg total) by mouth 2 (two) times daily. 02/08/23   Allayne Butcher, PA-C      Allergies    Patient has no known allergies.    Review of Systems   Review of Systems  Constitutional:  Negative for chills, fatigue and fever.  Respiratory:  Negative for cough, chest tightness, shortness of breath and wheezing.   Cardiovascular:  Positive for chest pain. Negative for palpitations.  Gastrointestinal:  Negative for abdominal pain, constipation, diarrhea, nausea and vomiting.  Skin:  Positive for wound.  Neurological:  Negative for dizziness, seizures, weakness, light-headedness, numbness and headaches.    Physical Exam Updated Vital Signs BP (!) 169/98 (BP Location: Right Arm)   Pulse (!) 101   Temp 97.9 F (36.6 C) (Oral)   Resp 16   Ht 5\' 8"  (1.727 m)   Wt 104.3 kg   SpO2 96%   BMI 34.96 kg/m  Physical Exam Vitals and nursing note reviewed.  Constitutional:      General: He is not in acute distress.    Appearance: Normal appearance. He is not ill-appearing.  HENT:     Head: Normocephalic and atraumatic.  Eyes:     General: No scleral icterus.       Right eye: No discharge.        Left eye: No discharge.     Conjunctiva/sclera: Conjunctivae normal.  Cardiovascular:     Rate and Rhythm: Normal rate.     Pulses: Normal pulses.  Pulmonary:     Effort: Pulmonary effort is normal. No respiratory distress.     Breath sounds: Normal breath sounds.  Chest:     Chest wall: Tenderness present.  Abdominal:     General: Bowel sounds are normal. There is no distension.     Palpations: Abdomen is soft.     Tenderness: There is no abdominal tenderness. There is no guarding.  Musculoskeletal:     Cervical back:  Normal range of motion and neck supple. No rigidity.     Right lower leg: No edema.     Left lower leg: No edema.  Skin:    General: Skin is warm.     Capillary Refill: Capillary refill takes less than 2 seconds.     Coloration: Skin is not jaundiced or pale.     Comments: Multiple boils that appear infected with surrounding erythema and warmth that are TTP. No active drainage nor fluctuance (see media)  Neurological:     Mental Status: He is alert and oriented to person, place, and time. Mental status is at baseline.     Motor: No weakness.     Gait: Gait normal.   ED Results / Procedures / Treatments   Labs (all labs ordered  are listed, but only abnormal results are displayed) Labs Reviewed  BASIC METABOLIC PANEL - Abnormal; Notable for the following components:      Result Value   Sodium 132 (*)    CO2 20 (*)    Glucose, Bld 426 (*)    BUN 29 (*)    Creatinine, Ser 1.97 (*)    Calcium 8.5 (*)    GFR, Estimated 41 (*)    All other components within normal limits  CBC - Abnormal; Notable for the following components:   RBC 4.06 (*)    Hemoglobin 12.1 (*)    HCT 36.4 (*)    All other components within normal limits  TROPONIN I (HIGH SENSITIVITY) - Abnormal; Notable for the following components:   Troponin I (High Sensitivity) 37 (*)    All other components within normal limits  TROPONIN I (HIGH SENSITIVITY)    EKG EKG Interpretation Date/Time:  Saturday September 09 2023 15:37:03 EST Ventricular Rate:  98 PR Interval:  144 QRS Duration:  72 QT Interval:  342 QTC Calculation: 436 R Axis:   12  Text Interpretation: Normal sinus rhythm T wave abnormality, consider inferolateral ischemia Abnormal ECG When compared with ECG of 01-Jun-2023 17:51, No significant change since last tracing Confirmed by Linwood Dibbles [phone]) on 09/09/2023 3:40:30 PM  Radiology DG Chest 2 View  Result Date: 09/09/2023 CLINICAL DATA:  Left-sided chest pain for 24 hours. Shortness of breath. EXAM:  CHEST - 2 VIEW COMPARISON:  06/01/2023 FINDINGS: Mild cardiac enlargement. Pulmonary vascular congestion. No pleural fluid, interstitial edema or airspace consolidation. Review of the visualized osseous structures are unremarkable. IMPRESSION: Cardiac enlargement and pulmonary vascular congestion. Electronically Signed   By: Signa Kell M.D.   On: 09/09/2023 16:48    Procedures Procedures  {Document cardiac monitor, telemetry assessment procedure when appropriate:1}  Medications Ordered in ED Medications - No data to display  ED Course/ Medical Decision Making/ A&P   {   Click here for ABCD2, HEART and other calculatorsREFRESH Note before signing :1}                              Medical Decision Making Amount and/or Complexity of Data Reviewed Labs: ordered.  Risk Prescription drug management.   Patient presents to the ED for concern of chest pain, skin infection, this involves an extensive number of treatment options, and is a complaint that carries with it a high risk of complications and morbidity.  The differential diagnosis includes ACS, pulmonary edema, PNA, MSK, infection   Co morbidities that complicate the patient evaluation  HTN, insulin-dependent type 2 diabetes, MRSA   Additional history obtained:  Additional history obtained from Nursing and Outside Medical Records   External records from outside source obtained and reviewed including triage RN note   Lab Tests:  I Ordered, and personally interpreted labs.  The pertinent results include:   Corrected sodium 140 in setting of hyperglycemia CBG 426 without DKA (no anion gap   Imaging Studies ordered:  I ordered imaging studies including CXR  I independently visualized and interpreted imaging which showed  Pulmonary congestion no fx, PNA, effusion I agree with the radiologist interpretation   Cardiac Monitoring:  The patient was maintained on a cardiac monitor.  I personally viewed and interpreted  the cardiac monitored which showed an underlying rhythm of: NSR with no change from previous EKG   Medicines ordered and prescription drug management:  I ordered medication  including tylenol and insulin  for pain and hyperglycemia  Reevaluation of the patient after these medicines showed that the patient improved I have reviewed the patients home medicines and have made adjustments as needed   Consultations Obtained:  I requested consultation with the ***,  and discussed lab and imaging findings as well as pertinent plan - they recommend: ***   Problem List / ED Course:  Chest pain Trop are flat and are lower than baseline of 42-47 over past 6 months Will provide Tylenol for pain Boil No active fluctuance for I&D As he had an entire course of doxy for similar boils on back on 08/11/23, will try short course of clinda Repeated infections are likely due to patient not controlling his CBG   Reevaluation:  After the interventions noted above, I reevaluated the patient and found that they have :{resolved/improved/worsened:23923::"improved"}   Social Determinants of Health:  ***   Dispostion:  After consideration of the diagnostic results and the patients response to treatment, I feel that the patent would benefit from ***.   Dr. Durwin Nora individually assessed patient and agrees with plan {Document critical care time when appropriate:1} {Document review of labs and clinical decision tools ie heart score, Chads2Vasc2 etc:1}  {Document your independent review of radiology images, and any outside records:1} {Document your discussion with family members, caretakers, and with consultants:1} {Document social determinants of health affecting pt's care:1} {Document your decision making why or why not admission, treatments were needed:1} Final Clinical Impression(s) / ED Diagnoses Final diagnoses:  None    Rx / DC Orders ED Discharge Orders     None

## 2023-09-12 ENCOUNTER — Other Ambulatory Visit (HOSPITAL_COMMUNITY): Payer: Self-pay

## 2023-09-12 ENCOUNTER — Ambulatory Visit (INDEPENDENT_AMBULATORY_CARE_PROVIDER_SITE_OTHER): Payer: Self-pay | Admitting: *Deleted

## 2023-09-12 MED ORDER — INSULIN GLARGINE SOLOSTAR 100 UNIT/ML ~~LOC~~ SOPN
22.0000 [IU] | PEN_INJECTOR | Freq: Two times a day (BID) | SUBCUTANEOUS | 5 refills | Status: DC
  Filled 2023-09-12: qty 15, 30d supply, fill #0

## 2023-09-12 NOTE — Telephone Encounter (Signed)
Reason for Disposition  SEVERE pain (e.g., excruciating)  Answer Assessment - Initial Assessment Questions 1. APPEARANCE of BOIL: "What does the boil look like?"      6 cm- closed, red inflamed 2. LOCATION: "Where is the boil located?"      Midline-middle of back 3. NUMBER: "How many boils are there?"      1 4. SIZE: "How big is the boil?" (e.g., inches, cm; compare to size of a coin or other object)     6 cm 5. ONSET: "When did the boil start?"     Started Tuesday-1 week 6. PAIN: "Is there any pain?" If Yes, ask: "How bad is the pain?"   (Scale 1-10; or mild, moderate, severe)     8/10 7. FEVER: "Do you have a fever?" If Yes, ask: "What is it, how was it measured, and when did it start?"      no 8. SOURCE: "Have you been around anyone with boils or other Staph infections?" "Have you ever had boils before?"     Yes- 3 months ago- not in same place 9. OTHER SYMPTOMS: "Do you have any other symptoms?" (e.g., shaking chills, weakness, rash elsewhere on body)     Hard to sleep at night  Protocols used: Boil (Skin Abscess)-A-AH

## 2023-09-12 NOTE — Telephone Encounter (Signed)
FYI

## 2023-09-12 NOTE — Telephone Encounter (Signed)
  Chief Complaint: cyst- boil Symptoms: seems larger- was seen at ED 09/09/23, currently taking antibiotic Frequency: started 1 week ago Pertinent Negatives: Patient denies fever Disposition: [] ED /[x] Urgent Care (no appt availability in office) / [] Appointment(In office/virtual)/ []  [facility]/ [] Home Care/ [] Refused Recommended Disposition /[] [place] [facility]/ []  Follow-up with PCP Additional Notes: Patient friend, Akon([facility]), on line- patient is having significant pain- area not better. Call to office- no open appointments- advised mobile unit/UC- patient does not have transportation- has to take bus. They are going to try VV/UC.

## 2023-09-13 ENCOUNTER — Inpatient Hospital Stay (HOSPITAL_COMMUNITY)
Admission: EM | Admit: 2023-09-13 | Discharge: 2023-09-18 | DRG: 603 | Disposition: A | Discharge status: Home Health | Payer: 59 | Attending: Internal Medicine | Admitting: Internal Medicine

## 2023-09-13 ENCOUNTER — Other Ambulatory Visit: Payer: Self-pay

## 2023-09-13 ENCOUNTER — Emergency Department (HOSPITAL_COMMUNITY): Payer: 59

## 2023-09-13 ENCOUNTER — Encounter (HOSPITAL_COMMUNITY): Payer: Self-pay

## 2023-09-13 DIAGNOSIS — E1169 Type 2 diabetes mellitus with other specified complication: Secondary | ICD-10-CM | POA: Diagnosis present

## 2023-09-13 DIAGNOSIS — L0390 Cellulitis, unspecified: Secondary | ICD-10-CM | POA: Diagnosis not present

## 2023-09-13 DIAGNOSIS — Z1611 Resistance to penicillins: Secondary | ICD-10-CM | POA: Diagnosis present

## 2023-09-13 DIAGNOSIS — Z86718 Personal history of other venous thrombosis and embolism: Secondary | ICD-10-CM

## 2023-09-13 DIAGNOSIS — Z8249 Family history of ischemic heart disease and other diseases of the circulatory system: Secondary | ICD-10-CM

## 2023-09-13 DIAGNOSIS — Z6836 Body mass index (BMI) 36.0-36.9, adult: Secondary | ICD-10-CM

## 2023-09-13 DIAGNOSIS — R911 Solitary pulmonary nodule: Secondary | ICD-10-CM | POA: Diagnosis present

## 2023-09-13 DIAGNOSIS — I10 Essential (primary) hypertension: Secondary | ICD-10-CM | POA: Diagnosis not present

## 2023-09-13 DIAGNOSIS — F22 Delusional disorders: Secondary | ICD-10-CM | POA: Diagnosis present

## 2023-09-13 DIAGNOSIS — D638 Anemia in other chronic diseases classified elsewhere: Secondary | ICD-10-CM | POA: Diagnosis present

## 2023-09-13 DIAGNOSIS — E46 Unspecified protein-calorie malnutrition: Secondary | ICD-10-CM | POA: Diagnosis present

## 2023-09-13 DIAGNOSIS — L02222 Furuncle of back [any part, except buttock]: Secondary | ICD-10-CM | POA: Diagnosis present

## 2023-09-13 DIAGNOSIS — Z9049 Acquired absence of other specified parts of digestive tract: Secondary | ICD-10-CM

## 2023-09-13 DIAGNOSIS — E669 Obesity, unspecified: Secondary | ICD-10-CM | POA: Diagnosis present

## 2023-09-13 DIAGNOSIS — N1831 Chronic kidney disease, stage 3a: Secondary | ICD-10-CM | POA: Diagnosis present

## 2023-09-13 DIAGNOSIS — L03312 Cellulitis of back [any part except buttock]: Secondary | ICD-10-CM | POA: Diagnosis not present

## 2023-09-13 DIAGNOSIS — L0211 Cutaneous abscess of neck: Secondary | ICD-10-CM

## 2023-09-13 DIAGNOSIS — D72829 Elevated white blood cell count, unspecified: Secondary | ICD-10-CM | POA: Diagnosis present

## 2023-09-13 DIAGNOSIS — D631 Anemia in chronic kidney disease: Secondary | ICD-10-CM | POA: Diagnosis present

## 2023-09-13 DIAGNOSIS — E1159 Type 2 diabetes mellitus with other circulatory complications: Secondary | ICD-10-CM | POA: Diagnosis present

## 2023-09-13 DIAGNOSIS — Z794 Long term (current) use of insulin: Secondary | ICD-10-CM

## 2023-09-13 DIAGNOSIS — R0989 Other specified symptoms and signs involving the circulatory and respiratory systems: Secondary | ICD-10-CM | POA: Diagnosis not present

## 2023-09-13 DIAGNOSIS — E1122 Type 2 diabetes mellitus with diabetic chronic kidney disease: Secondary | ICD-10-CM | POA: Diagnosis present

## 2023-09-13 DIAGNOSIS — Z9889 Other specified postprocedural states: Secondary | ICD-10-CM

## 2023-09-13 DIAGNOSIS — I130 Hypertensive heart and chronic kidney disease with heart failure and stage 1 through stage 4 chronic kidney disease, or unspecified chronic kidney disease: Secondary | ICD-10-CM | POA: Diagnosis present

## 2023-09-13 DIAGNOSIS — I5032 Chronic diastolic (congestive) heart failure: Secondary | ICD-10-CM | POA: Diagnosis present

## 2023-09-13 DIAGNOSIS — E440 Moderate protein-calorie malnutrition: Secondary | ICD-10-CM | POA: Diagnosis present

## 2023-09-13 DIAGNOSIS — G4733 Obstructive sleep apnea (adult) (pediatric): Secondary | ICD-10-CM | POA: Diagnosis present

## 2023-09-13 DIAGNOSIS — K5900 Constipation, unspecified: Secondary | ICD-10-CM | POA: Diagnosis present

## 2023-09-13 DIAGNOSIS — B9562 Methicillin resistant Staphylococcus aureus infection as the cause of diseases classified elsewhere: Secondary | ICD-10-CM | POA: Diagnosis present

## 2023-09-13 DIAGNOSIS — E785 Hyperlipidemia, unspecified: Secondary | ICD-10-CM | POA: Diagnosis present

## 2023-09-13 DIAGNOSIS — F32A Depression, unspecified: Secondary | ICD-10-CM | POA: Diagnosis present

## 2023-09-13 DIAGNOSIS — Z7982 Long term (current) use of aspirin: Secondary | ICD-10-CM

## 2023-09-13 DIAGNOSIS — Z79899 Other long term (current) drug therapy: Secondary | ICD-10-CM

## 2023-09-13 DIAGNOSIS — I152 Hypertension secondary to endocrine disorders: Secondary | ICD-10-CM | POA: Diagnosis present

## 2023-09-13 DIAGNOSIS — E1165 Type 2 diabetes mellitus with hyperglycemia: Secondary | ICD-10-CM

## 2023-09-13 DIAGNOSIS — Z792 Long term (current) use of antibiotics: Secondary | ICD-10-CM

## 2023-09-13 DIAGNOSIS — L02212 Cutaneous abscess of back [any part, except buttock]: Secondary | ICD-10-CM | POA: Diagnosis present

## 2023-09-13 LAB — CBC WITH DIFFERENTIAL/PLATELET
Abs Immature Granulocytes: 0.09 10*3/uL — ABNORMAL HIGH (ref 0.00–0.07)
Basophils Absolute: 0.1 10*3/uL (ref 0.0–0.1)
Basophils Relative: 0 %
Eosinophils Absolute: 0.3 10*3/uL (ref 0.0–0.5)
Eosinophils Relative: 2 %
HCT: 36.3 % — ABNORMAL LOW (ref 39.0–52.0)
Hemoglobin: 11.9 g/dL — ABNORMAL LOW (ref 13.0–17.0)
Immature Granulocytes: 1 %
Lymphocytes Relative: 12 %
Lymphs Abs: 1.7 10*3/uL (ref 0.7–4.0)
MCH: 29.3 pg (ref 26.0–34.0)
MCHC: 32.8 g/dL (ref 30.0–36.0)
MCV: 89.4 fL (ref 80.0–100.0)
Monocytes Absolute: 1.6 10*3/uL — ABNORMAL HIGH (ref 0.1–1.0)
Monocytes Relative: 11 %
Neutro Abs: 10.8 10*3/uL — ABNORMAL HIGH (ref 1.7–7.7)
Neutrophils Relative %: 74 %
Platelets: 302 10*3/uL (ref 150–400)
RBC: 4.06 MIL/uL — ABNORMAL LOW (ref 4.22–5.81)
RDW: 12 % (ref 11.5–15.5)
WBC: 14.5 10*3/uL — ABNORMAL HIGH (ref 4.0–10.5)
nRBC: 0 % (ref 0.0–0.2)

## 2023-09-13 LAB — COMPREHENSIVE METABOLIC PANEL
ALT: 22 U/L (ref 0–44)
AST: 15 U/L (ref 15–41)
Albumin: 2.4 g/dL — ABNORMAL LOW (ref 3.5–5.0)
Alkaline Phosphatase: 171 U/L — ABNORMAL HIGH (ref 38–126)
Anion gap: 9 (ref 5–15)
BUN: 29 mg/dL — ABNORMAL HIGH (ref 6–20)
CO2: 21 mmol/L — ABNORMAL LOW (ref 22–32)
Calcium: 9.1 mg/dL (ref 8.9–10.3)
Chloride: 101 mmol/L (ref 98–111)
Creatinine, Ser: 2.21 mg/dL — ABNORMAL HIGH (ref 0.61–1.24)
GFR, Estimated: 35 mL/min — ABNORMAL LOW (ref >60)
Glucose, Bld: 282 mg/dL — ABNORMAL HIGH (ref 70–99)
Potassium: 4.1 mmol/L (ref 3.5–5.1)
Sodium: 131 mmol/L — ABNORMAL LOW (ref 135–145)
Total Bilirubin: 0.5 mg/dL (ref <1.2)
Total Protein: 6.9 g/dL (ref 6.5–8.1)

## 2023-09-13 LAB — I-STAT CG4 LACTIC ACID, ED: Lactic Acid, Venous: 0.8 mmol/L (ref 0.5–1.9)

## 2023-09-13 MED ORDER — VANCOMYCIN HCL IN DEXTROSE 1-5 GM/200ML-% IV SOLN
1000.0000 mg | INTRAVENOUS | Status: AC
  Administered 2023-09-14 (×2): 1000 mg via INTRAVENOUS
  Filled 2023-09-13 (×2): qty 200

## 2023-09-13 NOTE — ED Triage Notes (Signed)
Pt came to ED for abscess on back that is becoming larger that started a week ago. Axox4. C/O delerium and SHOB and having weird dreams.

## 2023-09-13 NOTE — ED Provider Triage Note (Signed)
Emergency Medicine Provider Triage Evaluation Note  Leroy Simmons , a 51 y.o. male  was evaluated in triage.  Pt complains of skin abscess. Progressive worsening abscess to L upper back ongoing x 1 week. No improvement with PO abx x 3-4 days.  Now having confusion, increasing pain.  Review of Systems  Positive: As above Negative: As above  Physical Exam  BP (!) 177/98   Pulse (!) 103   Temp 99.5 F (37.5 C) (Oral)   Resp 18   Ht 5\' 8"  (1.727 m)   Wt 108.9 kg   SpO2 100%   BMI 36.49 kg/m  Gen:   Awake, no distress   Resp:  Normal effort  MSK:   Moves extremities without difficulty  Other:    Medical Decision Making  Medically screening exam initiated at 1:59 PM.  Appropriate orders placed.  Leroy Simmons was informed that the remainder of the evaluation will be completed by another provider, this initial triage assessment does not replace that evaluation, and the importance of remaining in the ED until their evaluation is complete.     Fayrene Helper, PA-C 09/13/23 1402

## 2023-09-14 ENCOUNTER — Inpatient Hospital Stay (HOSPITAL_COMMUNITY): Payer: 59

## 2023-09-14 DIAGNOSIS — E46 Unspecified protein-calorie malnutrition: Secondary | ICD-10-CM | POA: Diagnosis present

## 2023-09-14 DIAGNOSIS — E440 Moderate protein-calorie malnutrition: Secondary | ICD-10-CM | POA: Diagnosis not present

## 2023-09-14 DIAGNOSIS — E441 Mild protein-calorie malnutrition: Secondary | ICD-10-CM

## 2023-09-14 DIAGNOSIS — E1159 Type 2 diabetes mellitus with other circulatory complications: Secondary | ICD-10-CM | POA: Diagnosis not present

## 2023-09-14 DIAGNOSIS — L02212 Cutaneous abscess of back [any part, except buttock]: Secondary | ICD-10-CM | POA: Diagnosis not present

## 2023-09-14 DIAGNOSIS — I5033 Acute on chronic diastolic (congestive) heart failure: Secondary | ICD-10-CM | POA: Diagnosis not present

## 2023-09-14 DIAGNOSIS — I152 Hypertension secondary to endocrine disorders: Secondary | ICD-10-CM

## 2023-09-14 DIAGNOSIS — I5032 Chronic diastolic (congestive) heart failure: Secondary | ICD-10-CM | POA: Diagnosis not present

## 2023-09-14 DIAGNOSIS — R911 Solitary pulmonary nodule: Secondary | ICD-10-CM

## 2023-09-14 DIAGNOSIS — Z1611 Resistance to penicillins: Secondary | ICD-10-CM | POA: Diagnosis not present

## 2023-09-14 DIAGNOSIS — D638 Anemia in other chronic diseases classified elsewhere: Secondary | ICD-10-CM | POA: Diagnosis not present

## 2023-09-14 DIAGNOSIS — M546 Pain in thoracic spine: Secondary | ICD-10-CM | POA: Diagnosis not present

## 2023-09-14 DIAGNOSIS — I130 Hypertensive heart and chronic kidney disease with heart failure and stage 1 through stage 4 chronic kidney disease, or unspecified chronic kidney disease: Secondary | ICD-10-CM | POA: Diagnosis not present

## 2023-09-14 DIAGNOSIS — E669 Obesity, unspecified: Secondary | ICD-10-CM | POA: Diagnosis not present

## 2023-09-14 DIAGNOSIS — E119 Type 2 diabetes mellitus without complications: Secondary | ICD-10-CM | POA: Diagnosis not present

## 2023-09-14 DIAGNOSIS — L0390 Cellulitis, unspecified: Secondary | ICD-10-CM | POA: Diagnosis present

## 2023-09-14 DIAGNOSIS — Z86718 Personal history of other venous thrombosis and embolism: Secondary | ICD-10-CM

## 2023-09-14 DIAGNOSIS — E1165 Type 2 diabetes mellitus with hyperglycemia: Secondary | ICD-10-CM

## 2023-09-14 DIAGNOSIS — Z792 Long term (current) use of antibiotics: Secondary | ICD-10-CM | POA: Diagnosis not present

## 2023-09-14 DIAGNOSIS — F32A Depression, unspecified: Secondary | ICD-10-CM | POA: Diagnosis not present

## 2023-09-14 DIAGNOSIS — M48062 Spinal stenosis, lumbar region with neurogenic claudication: Secondary | ICD-10-CM | POA: Diagnosis not present

## 2023-09-14 DIAGNOSIS — N1831 Chronic kidney disease, stage 3a: Secondary | ICD-10-CM

## 2023-09-14 DIAGNOSIS — F22 Delusional disorders: Secondary | ICD-10-CM | POA: Diagnosis not present

## 2023-09-14 DIAGNOSIS — E785 Hyperlipidemia, unspecified: Secondary | ICD-10-CM | POA: Diagnosis not present

## 2023-09-14 DIAGNOSIS — E1169 Type 2 diabetes mellitus with other specified complication: Secondary | ICD-10-CM | POA: Diagnosis not present

## 2023-09-14 DIAGNOSIS — D72829 Elevated white blood cell count, unspecified: Secondary | ICD-10-CM | POA: Diagnosis not present

## 2023-09-14 DIAGNOSIS — Z794 Long term (current) use of insulin: Secondary | ICD-10-CM

## 2023-09-14 DIAGNOSIS — E1122 Type 2 diabetes mellitus with diabetic chronic kidney disease: Secondary | ICD-10-CM | POA: Diagnosis not present

## 2023-09-14 DIAGNOSIS — Z6836 Body mass index (BMI) 36.0-36.9, adult: Secondary | ICD-10-CM | POA: Diagnosis not present

## 2023-09-14 DIAGNOSIS — L03312 Cellulitis of back [any part except buttock]: Secondary | ICD-10-CM

## 2023-09-14 DIAGNOSIS — L02222 Furuncle of back [any part, except buttock]: Secondary | ICD-10-CM | POA: Diagnosis not present

## 2023-09-14 DIAGNOSIS — D631 Anemia in chronic kidney disease: Secondary | ICD-10-CM | POA: Diagnosis not present

## 2023-09-14 DIAGNOSIS — K5900 Constipation, unspecified: Secondary | ICD-10-CM | POA: Diagnosis not present

## 2023-09-14 DIAGNOSIS — N189 Chronic kidney disease, unspecified: Secondary | ICD-10-CM | POA: Diagnosis not present

## 2023-09-14 DIAGNOSIS — G4733 Obstructive sleep apnea (adult) (pediatric): Secondary | ICD-10-CM | POA: Diagnosis not present

## 2023-09-14 LAB — COMPREHENSIVE METABOLIC PANEL
ALT: 19 U/L (ref 0–44)
AST: 14 U/L — ABNORMAL LOW (ref 15–41)
Albumin: 2 g/dL — ABNORMAL LOW (ref 3.5–5.0)
Alkaline Phosphatase: 138 U/L — ABNORMAL HIGH (ref 38–126)
Anion gap: 9 (ref 5–15)
BUN: 31 mg/dL — ABNORMAL HIGH (ref 6–20)
CO2: 22 mmol/L (ref 22–32)
Calcium: 8.4 mg/dL — ABNORMAL LOW (ref 8.9–10.3)
Chloride: 102 mmol/L (ref 98–111)
Creatinine, Ser: 2.1 mg/dL — ABNORMAL HIGH (ref 0.61–1.24)
GFR, Estimated: 38 mL/min — ABNORMAL LOW (ref >60)
Glucose, Bld: 277 mg/dL — ABNORMAL HIGH (ref 70–99)
Potassium: 3.9 mmol/L (ref 3.5–5.1)
Sodium: 133 mmol/L — ABNORMAL LOW (ref 135–145)
Total Bilirubin: 0.6 mg/dL (ref <1.2)
Total Protein: 5.8 g/dL — ABNORMAL LOW (ref 6.5–8.1)

## 2023-09-14 LAB — URINALYSIS, W/ REFLEX TO CULTURE (INFECTION SUSPECTED)
Bilirubin Urine: NEGATIVE
Glucose, UA: >=500 mg/dL — AB
Ketones, ur: NEGATIVE mg/dL
Leukocytes,Ua: NEGATIVE
Nitrite: NEGATIVE
Protein, ur: >=300 mg/dL — AB
Specific Gravity, Urine: 1.023 (ref 1.005–1.030)
pH: 5 (ref 5.0–8.0)

## 2023-09-14 LAB — CBC WITH DIFFERENTIAL/PLATELET
Abs Immature Granulocytes: 0.06 10*3/uL (ref 0.00–0.07)
Basophils Absolute: 0.1 10*3/uL (ref 0.0–0.1)
Basophils Relative: 0 %
Eosinophils Absolute: 0.3 10*3/uL (ref 0.0–0.5)
Eosinophils Relative: 3 %
HCT: 30.2 % — ABNORMAL LOW (ref 39.0–52.0)
Hemoglobin: 10.3 g/dL — ABNORMAL LOW (ref 13.0–17.0)
Immature Granulocytes: 1 %
Lymphocytes Relative: 16 %
Lymphs Abs: 2 10*3/uL (ref 0.7–4.0)
MCH: 30 pg (ref 26.0–34.0)
MCHC: 34.1 g/dL (ref 30.0–36.0)
MCV: 88 fL (ref 80.0–100.0)
Monocytes Absolute: 1.2 10*3/uL — ABNORMAL HIGH (ref 0.1–1.0)
Monocytes Relative: 10 %
Neutro Abs: 8.8 10*3/uL — ABNORMAL HIGH (ref 1.7–7.7)
Neutrophils Relative %: 70 %
Platelets: 264 10*3/uL (ref 150–400)
RBC: 3.43 MIL/uL — ABNORMAL LOW (ref 4.22–5.81)
RDW: 12.1 % (ref 11.5–15.5)
WBC: 12.5 10*3/uL — ABNORMAL HIGH (ref 4.0–10.5)
nRBC: 0 % (ref 0.0–0.2)

## 2023-09-14 LAB — CBG MONITORING, ED: Glucose-Capillary: 249 mg/dL — ABNORMAL HIGH (ref 70–99)

## 2023-09-14 LAB — C-REACTIVE PROTEIN: CRP: 5.1 mg/dL — ABNORMAL HIGH (ref <1.0)

## 2023-09-14 LAB — SEDIMENTATION RATE: Sed Rate: 90 mm/h — ABNORMAL HIGH (ref 0–16)

## 2023-09-14 LAB — GLUCOSE, CAPILLARY
Glucose-Capillary: 262 mg/dL — ABNORMAL HIGH (ref 70–99)
Glucose-Capillary: 228 mg/dL — ABNORMAL HIGH (ref 70–99)

## 2023-09-14 LAB — MRSA NEXT GEN BY PCR, NASAL: MRSA by PCR Next Gen: NOT DETECTED

## 2023-09-14 LAB — PREALBUMIN: Prealbumin: 14 mg/dL — ABNORMAL LOW (ref 18–38)

## 2023-09-14 LAB — LACTIC ACID, PLASMA: Lactic Acid, Venous: 1 mmol/L (ref 0.5–1.9)

## 2023-09-14 LAB — MAGNESIUM: Magnesium: 2 mg/dL (ref 1.7–2.4)

## 2023-09-14 MED ORDER — FENTANYL CITRATE PF 50 MCG/ML IJ SOSY: 50.0000 ug | PREFILLED_SYRINGE | INTRAMUSCULAR | Status: DC | PRN

## 2023-09-14 MED ORDER — INSULIN GLARGINE-YFGN 100 UNIT/ML ~~LOC~~ SOLN
22.0000 [IU] | Freq: Two times a day (BID) | SUBCUTANEOUS | Status: DC
  Administered 2023-09-14: 22 [IU] via SUBCUTANEOUS
  Filled 2023-09-14 (×4): qty 0.22

## 2023-09-14 MED ORDER — HYDROCODONE-ACETAMINOPHEN 5-325 MG PO TABS
1.0000 | ORAL_TABLET | Freq: Four times a day (QID) | ORAL | Status: DC | PRN
  Administered 2023-09-14 – 2023-09-16 (×2): 1 via ORAL
  Filled 2023-09-14 (×2): qty 1

## 2023-09-14 MED ORDER — MELATONIN 3 MG PO TABS
3.0000 mg | ORAL_TABLET | Freq: Every evening | ORAL | Status: DC | PRN
Start: 2023-09-14 — End: 2023-09-18

## 2023-09-14 MED ORDER — FENTANYL CITRATE PF 50 MCG/ML IJ SOSY
25.0000 ug | PREFILLED_SYRINGE | INTRAMUSCULAR | Status: DC | PRN
  Administered 2023-09-14 – 2023-09-15 (×2): 25 ug via INTRAVENOUS
  Filled 2023-09-14 (×2): qty 1

## 2023-09-14 MED ORDER — ACETAMINOPHEN 650 MG RE SUPP: 650.0000 mg | Freq: Four times a day (QID) | RECTAL | Status: DC | PRN

## 2023-09-14 MED ORDER — SODIUM CHLORIDE 0.9 % IV SOLN
2.0000 g | INTRAVENOUS | Status: DC
  Administered 2023-09-14 – 2023-09-16 (×3): 2 g via INTRAVENOUS
  Filled 2023-09-14 (×3): qty 20

## 2023-09-14 MED ORDER — ACETAMINOPHEN 325 MG PO TABS: 650.0000 mg | ORAL_TABLET | Freq: Four times a day (QID) | ORAL | Status: DC | PRN

## 2023-09-14 MED ORDER — ENOXAPARIN SODIUM 40 MG/0.4ML IJ SOSY
40.0000 mg | PREFILLED_SYRINGE | Freq: Every day | INTRAMUSCULAR | Status: DC
  Administered 2023-09-14 – 2023-09-18 (×5): 40 mg via SUBCUTANEOUS
  Filled 2023-09-14 (×5): qty 0.4

## 2023-09-14 MED ORDER — VANCOMYCIN HCL IN DEXTROSE 1-5 GM/200ML-% IV SOLN
1000.0000 mg | INTRAVENOUS | Status: DC
  Administered 2023-09-14 – 2023-09-16 (×3): 1000 mg via INTRAVENOUS
  Filled 2023-09-14 (×3): qty 200

## 2023-09-14 MED ORDER — ALBUTEROL SULFATE (2.5 MG/3ML) 0.083% IN NEBU
2.5000 mg | INHALATION_SOLUTION | Freq: Four times a day (QID) | RESPIRATORY_TRACT | Status: DC | PRN
Start: 2023-09-14 — End: 2023-09-18

## 2023-09-14 MED ORDER — INSULIN ASPART 100 UNIT/ML IJ SOLN
0.0000 [IU] | Freq: Every day | INTRAMUSCULAR | Status: DC
  Administered 2023-09-15: 4 [IU] via SUBCUTANEOUS

## 2023-09-14 MED ORDER — INSULIN ASPART 100 UNIT/ML IJ SOLN
0.0000 [IU] | Freq: Three times a day (TID) | INTRAMUSCULAR | Status: DC
  Administered 2023-09-14: 5 [IU] via SUBCUTANEOUS
  Administered 2023-09-14: 8 [IU] via SUBCUTANEOUS
  Administered 2023-09-14: 5 [IU] via SUBCUTANEOUS
  Administered 2023-09-15 (×2): 3 [IU] via SUBCUTANEOUS
  Administered 2023-09-15: 15 [IU] via SUBCUTANEOUS

## 2023-09-14 MED ORDER — NALOXONE HCL 0.4 MG/ML IJ SOLN: 0.4000 mg | INTRAMUSCULAR | Status: DC | PRN

## 2023-09-14 MED ORDER — FENTANYL CITRATE PF 50 MCG/ML IJ SOSY
25.0000 ug | PREFILLED_SYRINGE | INTRAMUSCULAR | Status: DC | PRN
  Administered 2023-09-14: 25 ug via INTRAVENOUS
  Filled 2023-09-14: qty 1

## 2023-09-14 MED ORDER — AMLODIPINE BESYLATE 10 MG PO TABS
10.0000 mg | ORAL_TABLET | Freq: Every day | ORAL | Status: DC
  Administered 2023-09-14 – 2023-09-18 (×5): 10 mg via ORAL
  Filled 2023-09-14 (×5): qty 1

## 2023-09-14 MED ORDER — KETOROLAC TROMETHAMINE 30 MG/ML IJ SOLN
15.0000 mg | Freq: Once | INTRAMUSCULAR | Status: AC
  Administered 2023-09-14: 15 mg via INTRAVENOUS
  Filled 2023-09-14: qty 1

## 2023-09-14 MED ORDER — METRONIDAZOLE 500 MG/100ML IV SOLN
500.0000 mg | Freq: Two times a day (BID) | INTRAVENOUS | Status: DC
Start: 2023-09-14 — End: 2023-09-16
  Administered 2023-09-14 – 2023-09-16 (×4): 500 mg via INTRAVENOUS
  Filled 2023-09-14 (×4): qty 100

## 2023-09-14 MED ORDER — INSULIN GLARGINE-YFGN 100 UNIT/ML ~~LOC~~ SOLN
25.0000 [IU] | Freq: Two times a day (BID) | SUBCUTANEOUS | Status: DC
  Administered 2023-09-14 – 2023-09-15 (×3): 25 [IU] via SUBCUTANEOUS
  Filled 2023-09-14 (×5): qty 0.25

## 2023-09-14 MED ORDER — ACETAMINOPHEN 500 MG PO TABS: 1000.0000 mg | ORAL_TABLET | Freq: Four times a day (QID) | ORAL | Status: DC | PRN

## 2023-09-14 MED ORDER — ONDANSETRON HCL 4 MG/2ML IJ SOLN: 4.0000 mg | Freq: Four times a day (QID) | INTRAMUSCULAR | Status: DC | PRN

## 2023-09-14 NOTE — ED Notes (Signed)
ED TO INPATIENT HANDOFF REPORT  ED Nurse Name and Phone #: [phone]  S Name/Age/Gender Leroy Simmons 52 y.o. male Room/Bed: H018C/H018C  Code Status   Code Status: Full Code  Home/SNF/Other Home Patient oriented to: self, place, time, and situation Is this baseline? Yes   Triage Complete: Triage complete  Chief Complaint Cellulitis [L03.90]  Triage Note Pt came to ED for abscess on back that is becoming larger that started a week ago. Axox4. C/O delerium and SHOB and having weird dreams.    Allergies No Known Allergies  Level of Care/Admitting Diagnosis ED Disposition     ED Disposition  Admit   Condition  --   Comment  Hospital Area: [facility] [[id number]]  Level of Care: Telemetry Medical [104]  May admit patient to [facility] or [facility] if equivalent level of care is available:: No  Covid Evaluation: Asymptomatic - no recent exposure (last 10 days) testing not required  Diagnosis: Cellulitis [[id number]]  Admitting Physician: Angie Fava [[id number]]  Attending Physician: Angie Fava [[id number]]  Certification:: I certify this patient will need inpatient services for at least 2 midnights  Expected Medical Readiness: 09/16/2023          B Medical/Surgery History Past Medical History:  Diagnosis Date   Acute gangrenous appendicitis 09/25/2022   Anxiety    CHF (congestive heart failure) (HCC)    Depression    Diabetes mellitus without complication (HCC)    Hypertension    Past Surgical History:  Procedure Laterality Date   LAPAROSCOPIC APPENDECTOMY N/A 09/26/2022   Procedure: APPENDECTOMY LAPAROSCOPIC;  Surgeon: Campbell Lerner, MD;  Location: [facility] ORS;  Service: General;  Laterality: N/A;   LEFT HEART CATH AND CORONARY ANGIOGRAPHY N/A 03/21/2022   Procedure: LEFT HEART CATH AND CORONARY ANGIOGRAPHY;  Surgeon: Corky Crafts, MD;  Location: [facility];  Service: Cardiovascular;  Laterality: N/A;     A IV  Location/Drains/Wounds Patient Lines/Drains/Airways Status     Active Line/Drains/Airways     Name Placement date Placement time Site Days   Peripheral IV 09/13/23 20 G 1" Right Antecubital 09/13/23  2341  Antecubital  1   Incision - 3 Ports Abdomen Umbilicus Left;Mid Mid;Lower 09/26/22  1306  -- 353   Wound / Incision (Open or Dehisced) 03/14/23 Other (Comment) Vertebral column Anterior Per pt it was like a boil and it burst, Are is red not raised and open 03/14/23  0146  Vertebral column  184            Intake/Output Last 24 hours  Intake/Output Summary (Last 24 hours) at 09/14/2023 1033 Last data filed at 09/14/2023 3612 Gross per 24 hour  Intake 350 ml  Output 250 ml  Net 100 ml    Labs/Imaging Results for orders placed or performed during the hospital encounter of 09/13/23 (from the past 48 hours)  Blood culture (routine x 2)     Status: None (Preliminary result)   Collection Time: 09/13/23  2:01 PM   Specimen: BLOOD  Result Value Ref Range   Specimen Description BLOOD RIGHT ANTECUBITAL    Special Requests      BOTTLES DRAWN AEROBIC ONLY Blood Culture results may not be optimal due to an inadequate volume of blood received in culture bottles   Culture      NO GROWTH < 24 HOURS Performed at [facility], [address] [place], [place] [address]    Report Status PENDING   Comprehensive metabolic  panel     Status: Abnormal   Collection Time: 09/13/23  2:24 PM  Result Value Ref Range   Sodium 131 (L) 135 - 145 mmol/L   Potassium 4.1 3.5 - 5.1 mmol/L   Chloride 101 98 - 111 mmol/L   CO2 21 (L) 22 - 32 mmol/L   Glucose, Bld 282 (H) 70 - 99 mg/dL    Comment: Glucose reference range applies only to samples taken after fasting for at least 8 hours.   BUN 29 (H) 6 - 20 mg/dL   Creatinine, Ser 1.61 (H) 0.61 - 1.24 mg/dL   Calcium 9.1 8.9 - 09.6 mg/dL   Total Protein 6.9 6.5 - 8.1 g/dL   Albumin 2.4 (L) 3.5 - 5.0 g/dL   AST 15 15 - 41 U/L   ALT 22 0 - 44 U/L    Alkaline Phosphatase 171 (H) 38 - 126 U/L   Total Bilirubin 0.5 <1.2 mg/dL   GFR, Estimated 35 (L) >60 mL/min    Comment: (NOTE) Calculated using the CKD-EPI Creatinine Equation (2021)    Anion gap 9 5 - 15    Comment: Performed at [facility], [address], [place], [place] [address]  CBC with Differential     Status: Abnormal   Collection Time: 09/13/23  2:24 PM  Result Value Ref Range   WBC 14.5 (H) 4.0 - 10.5 K/uL   RBC 4.06 (L) 4.22 - 5.81 MIL/uL   Hemoglobin 11.9 (L) 13.0 - 17.0 g/dL   HCT 98.1 (L) 19.1 - 47.8 %   MCV 89.4 80.0 - 100.0 fL   MCH 29.3 26.0 - 34.0 pg   MCHC 32.8 30.0 - 36.0 g/dL   RDW 29.5 62.1 - 30.8 %   Platelets 302 150 - 400 K/uL   nRBC 0.0 0.0 - 0.2 %   Neutrophils Relative % 74 %   Neutro Abs 10.8 (H) 1.7 - 7.7 K/uL   Lymphocytes Relative 12 %   Lymphs Abs 1.7 0.7 - 4.0 K/uL   Monocytes Relative 11 %   Monocytes Absolute 1.6 (H) 0.1 - 1.0 K/uL   Eosinophils Relative 2 %   Eosinophils Absolute 0.3 0.0 - 0.5 K/uL   Basophils Relative 0 %   Basophils Absolute 0.1 0.0 - 0.1 K/uL   Immature Granulocytes 1 %   Abs Immature Granulocytes 0.09 (H) 0.00 - 0.07 K/uL    Comment: Performed at [facility], [address] [place], [place] [address]  I-Stat Lactic Acid, ED     Status: None   Collection Time: 09/13/23  2:29 PM  Result Value Ref Range   Lactic Acid, Venous 0.8 0.5 - 1.9 mmol/L  Blood culture (routine x 2)     Status: None (Preliminary result)   Collection Time: 09/13/23 11:42 PM   Specimen: BLOOD RIGHT ARM  Result Value Ref Range   Specimen Description BLOOD RIGHT ARM    Special Requests      BOTTLES DRAWN AEROBIC AND ANAEROBIC Blood Culture adequate volume   Culture      NO GROWTH < 12 HOURS Performed at [facility], [address] [place], [place] [address]    Report Status PENDING   Urinalysis, w/ Reflex to Culture (Infection Suspected) -Urine, Clean Catch     Status: Abnormal   Collection Time: 09/14/23  2:47 AM   Result Value Ref Range   Specimen Source URINE, CLEAN CATCH    Color, Urine YELLOW YELLOW   APPearance  HAZY (A) CLEAR   Specific Gravity, Urine 1.023 1.005 - 1.030   pH 5.0 5.0 - 8.0   Glucose, UA >=500 (A) NEGATIVE mg/dL   Hgb urine dipstick SMALL (A) NEGATIVE   Bilirubin Urine NEGATIVE NEGATIVE   Ketones, ur NEGATIVE NEGATIVE mg/dL   Protein, ur >=454 (A) NEGATIVE mg/dL   Nitrite NEGATIVE NEGATIVE   Leukocytes,Ua NEGATIVE NEGATIVE   RBC / HPF 6-10 0 - 5 RBC/hpf   WBC, UA 6-10 0 - 5 WBC/hpf    Comment:        Reflex urine culture not performed if WBC <=10, OR if Squamous epithelial cells >5. If Squamous epithelial cells >5 suggest recollection.    Bacteria, UA RARE (A) NONE SEEN   Squamous Epithelial / HPF 0-5 0 - 5 /HPF   Mucus PRESENT    Hyaline Casts, UA PRESENT     Comment: Performed at [facility], [address], [place], [place] [address]  CBC with Differential/Platelet     Status: Abnormal   Collection Time: 09/14/23  5:06 AM  Result Value Ref Range   WBC 12.5 (H) 4.0 - 10.5 K/uL   RBC 3.43 (L) 4.22 - 5.81 MIL/uL   Hemoglobin 10.3 (L) 13.0 - 17.0 g/dL   HCT 91.4 (L) 78.2 - 95.6 %   MCV 88.0 80.0 - 100.0 fL   MCH 30.0 26.0 - 34.0 pg   MCHC 34.1 30.0 - 36.0 g/dL   RDW 21.3 08.6 - 57.8 %   Platelets 264 150 - 400 K/uL   nRBC 0.0 0.0 - 0.2 %   Neutrophils Relative % 70 %   Neutro Abs 8.8 (H) 1.7 - 7.7 K/uL   Lymphocytes Relative 16 %   Lymphs Abs 2.0 0.7 - 4.0 K/uL   Monocytes Relative 10 %   Monocytes Absolute 1.2 (H) 0.1 - 1.0 K/uL   Eosinophils Relative 3 %   Eosinophils Absolute 0.3 0.0 - 0.5 K/uL   Basophils Relative 0 %   Basophils Absolute 0.1 0.0 - 0.1 K/uL   Immature Granulocytes 1 %   Abs Immature Granulocytes 0.06 0.00 - 0.07 K/uL    Comment: Performed at [facility], [address], [place], [place] [address]  Comprehensive metabolic panel     Status: Abnormal   Collection Time: 09/14/23  5:06 AM  Result Value Ref Range   Sodium  133 (L) 135 - 145 mmol/L   Potassium 3.9 3.5 - 5.1 mmol/L   Chloride 102 98 - 111 mmol/L   CO2 22 22 - 32 mmol/L   Glucose, Bld 277 (H) 70 - 99 mg/dL    Comment: Glucose reference range applies only to samples taken after fasting for at least 8 hours.   BUN 31 (H) 6 - 20 mg/dL   Creatinine, Ser 9.52 (H) 0.61 - 1.24 mg/dL   Calcium 8.4 (L) 8.9 - 10.3 mg/dL   Total Protein 5.8 (L) 6.5 - 8.1 g/dL   Albumin 2.0 (L) 3.5 - 5.0 g/dL   AST 14 (L) 15 - 41 U/L   ALT 19 0 - 44 U/L   Alkaline Phosphatase 138 (H) 38 - 126 U/L   Total Bilirubin 0.6 <1.2 mg/dL   GFR, Estimated 38 (L) >60 mL/min    Comment: (NOTE) Calculated using the CKD-EPI Creatinine Equation (2021)    Anion gap 9 5 - 15    Comment: Performed at [facility], [address] [place], [place] [address]  Magnesium  Status: None   Collection Time: 09/14/23  5:06 AM  Result Value Ref Range   Magnesium 2.0 1.7 - 2.4 mg/dL    Comment: Performed at [facility], [address], [place], [place] [address]  Sedimentation rate     Status: Abnormal   Collection Time: 09/14/23  5:06 AM  Result Value Ref Range   Sed Rate 90 (H) 0 - 16 mm/hr    Comment: Performed at [facility], [address] [place], [place] [address]  Prealbumin     Status: Abnormal   Collection Time: 09/14/23  5:06 AM  Result Value Ref Range   Prealbumin 14 (L) 18 - 38 mg/dL    Comment: Performed at [facility], [address] [place], [place] [address]  MRSA Next Gen by PCR, Nasal     Status: None   Collection Time: 09/14/23  6:52 AM   Specimen: Nasal Mucosa; Nasal Swab  Result Value Ref Range   MRSA by PCR Next Gen NOT DETECTED NOT DETECTED    Comment: (NOTE) The GeneXpert MRSA Assay (FDA approved for NASAL specimens only), is one component of a comprehensive MRSA colonization surveillance program. It is not intended to diagnose MRSA infection nor to guide or monitor treatment for MRSA infections. Test performance is not FDA  approved in patients less than 44 years old. Performed at [facility], [address], [place], [place] [address]   CBG monitoring, ED     Status: Abnormal   Collection Time: 09/14/23  8:04 AM  Result Value Ref Range   Glucose-Capillary 249 (H) 70 - 99 mg/dL    Comment: Glucose reference range applies only to samples taken after fasting for at least 8 hours.   Comment 1 Notify RN    Comment 2 Document in Chart    DG Chest 2 View Result Date: 09/13/2023 CLINICAL DATA:  Left shoulder abscess. EXAM: CHEST - 2 VIEW COMPARISON:  X-ray 09/09/2023.  Older exams as well FINDINGS: Under penetrated underinflated x-ray. Enlarged cardiopericardial silhouette with vascular congestion. No pneumothorax, effusion or consolidation. Mild peribronchial thickening IMPRESSION: Underinflation.  Vascular congestion with peribronchial thickening. Electronically Signed   By: Karen Kays M.D.   On: 09/13/2023 15:48    Pending Labs Unresulted Labs (From admission, onward)     Start     Ordered   09/15/23 0500  CBC  Tomorrow morning,   R        09/14/23 0744   09/15/23 0500  Comprehensive metabolic panel  Tomorrow morning,   R        09/14/23 0744   09/14/23 0830  C-reactive protein  Once,   AD        09/14/23 0830   09/14/23 0740  Lactic acid, plasma  (Lactic Acid)  Add-on,   AD        09/14/23 0744            Vitals/Pain Today's Vitals   09/14/23 0600 09/14/23 0630 09/14/23 0739 09/14/23 0802  BP: 134/78 (!) 140/85  (!) 154/75  Pulse: 93 95 96 93  Resp: 15 13 11 16   Temp: 98.5 F (36.9 C)   99.1 F (37.3 C)  TempSrc:      SpO2: 96% 94% (!) 88% 98%  Weight:      Height:      PainSc:        Isolation Precautions No active isolations  Medications Medications  melatonin tablet 3 mg (has  no administration in time range)  ondansetron (ZOFRAN) injection 4 mg (has no administration in time range)  naloxone [facility]) injection 0.4 mg (has no administration in time range)  vancomycin  (VANCOCIN) IVPB 1000 mg/200 mL premix (has no administration in time range)  insulin aspart (novoLOG) injection 0-15 Units (5 Units Subcutaneous Given 09/14/23 0850)  insulin aspart (novoLOG) injection 0-5 Units (has no administration in time range)  enoxaparin (LOVENOX) injection 40 mg (40 mg Subcutaneous Given 09/14/23 1032)  acetaminophen (TYLENOL) tablet 650 mg (has no administration in time range)    Or  acetaminophen (TYLENOL) suppository 650 mg (has no administration in time range)  albuterol (PROVENTIL) (2.5 MG/3ML) 0.083% nebulizer solution 2.5 mg (has no administration in time range)  fentaNYL (SUBLIMAZE) injection 25 mcg (has no administration in time range)  HYDROcodone-acetaminophen (NORCO/VICODIN) 5-325 MG per tablet 1 tablet (has no administration in time range)  insulin glargine-yfgn (SEMGLEE) injection 22 Units (22 Units Subcutaneous Given 09/14/23 1032)  vancomycin (VANCOCIN) IVPB 1000 mg/200 mL premix (0 mg Intravenous Stopped 09/14/23 0411)  ketorolac (TORADOL) 30 MG/ML injection 15 mg (15 mg Intravenous Given 09/14/23 0036)    Mobility walks     Focused Assessments    R Recommendations: See Admitting Provider Note  Report given to:   Additional Notes:

## 2023-09-14 NOTE — H&P (Addendum)
History and Physical    Patient: Leroy Simmons XNA:[id number] DOB: 07-13-1971 DOA: 09/13/2023 DOS: the patient was seen and examined on 09/14/2023 PCP: Grayce Sessions, NP  Patient coming from: Home  Chief Complaint:  Chief Complaint  Patient presents with   Abscess   HPI: Leroy Simmons is a 52 y.o. male with medical history significant of hypertension, CHF, diabetes mellitus type 2, CKD stage III, anxiety, depression, history of DVT not on anticoagulation, and recurrent boils who presents with complaints of back pain from an infection.  The patient is unsure if it's due to cyst, a boil, or cellulitis. Symptoms include swelling, severe pain radiating from his back and wrapping around to the left side of his chest.  The patient reports experiencing delusional symptoms, described as "fever dreams" with unusual and bizarre content.  At home, the patient experienced chills, but is unsure about fever. The patient's housemate noticed the affected area looked like it had come to a head, but there had been no drainage.  He had been seen in the emergency department 4 days ago, where he thinks that he had an ultrasound that reportedly showing no fluid.  He had been started on clindamycin at that time, but reports that the medicine was making him nauseous and his pain seemed to worsen.  Review of records note that patient had been seen in the emergency department on 11/8 for reported cellulitis of his back and he was treated with doxycycline and chlorhexidine wipes with improvement in symptoms temporarily.  He was hospitalized last in June requiring I&D of the same.  Patient reports that he frequently has these skin infection and does not know how to prevent them.  He reports that his blood sugar levels have been in the high 200s recently.  Denies having any low blood sugars.  He also reports that he has not had any significant episodes of vomiting or blood in his urine/stool to his knowledge.  In the  emergency department patient was noted to be afebrile with pulse 88-1 03, blood pressures elevated up to 177/98, and all other vital signs maintained.  Labs significant for WBC 14.5->12.5, hemoglobin 11.9 ->10.3, sodium 131 ->133, BUN 31, creatinine 2.2 ->2.1, glucose 277, anion gap 9, alkaline phosphatase 138, and albumin 2.  Chest x-ray noted underinflation with vascular congestion with peribronchial thickening.  Urinalysis positive for glucose, small hemoglobin, negative ketones, greater than 300 protein, rare bacteria, 6-10 RBCs/hpf, and 6-10 WBCs.  Blood cultures have been obtained. Patient had been given ketorolac 15 mg IV for pain and vancomycin IV.  Review of Systems: As mentioned in the history of present illness. All other systems reviewed and are negative. Past Medical History:  Diagnosis Date   Acute gangrenous appendicitis 09/25/2022   Anxiety    CHF (congestive heart failure) (HCC)    Depression    Diabetes mellitus without complication (HCC)    Hypertension    Past Surgical History:  Procedure Laterality Date   LAPAROSCOPIC APPENDECTOMY N/A 09/26/2022   Procedure: APPENDECTOMY LAPAROSCOPIC;  Surgeon: Campbell Lerner, MD;  Location: [facility] ORS;  Service: General;  Laterality: N/A;   LEFT HEART CATH AND CORONARY ANGIOGRAPHY N/A 03/21/2022   Procedure: LEFT HEART CATH AND CORONARY ANGIOGRAPHY;  Surgeon: Corky Crafts, MD;  Location: [facility] INVASIVE CV LAB;  Service: Cardiovascular;  Laterality: N/A;   Social History:  reports that he has never smoked. He has never used smokeless tobacco. He reports current alcohol use. He reports that he does not  use drugs.  No Known Allergies  Family History  Problem Relation Age of Onset   Heart failure Mother     Prior to Admission medications   Medication Sig Start Date End Date Taking? Authorizing Provider  albuterol (VENTOLIN HFA) 108 (90 Base) MCG/ACT inhaler Inhale 2 puffs into the lungs every 6 (six) hours as needed for wheezing or  shortness of breath. 01/13/23  Yes Leslye Peer, MD  amLODipine (NORVASC) 10 MG tablet Take 1 tablet (10 mg total) by mouth daily. 03/22/23  Yes Rhetta Mura, MD  chlorhexidine (HIBICLENS) 4 % external liquid Apply topically daily as needed. 08/11/23  Yes Halford Decamp, PA-C  Insulin Glargine Solostar (LANTUS) 100 UNIT/ML Solostar Pen Inject 22 Units into the skin 2 (two) times daily. Patient taking differently: Inject 25 Units into the skin 2 (two) times daily. 09/09/23  Yes Gloris Manchester, MD  insulin lispro (HUMALOG KWIKPEN) 100 UNIT/ML KwikPen Inject 5 Units into the skin 3 (three) times daily before meals. Patient taking differently: Inject 5 Units into the skin 3 (three) times daily. 02/09/23  Yes Grayce Sessions, NP  lisinopril (ZESTRIL) 20 MG tablet Take 1 tablet (20 mg total) by mouth daily. 10/19/22  Yes Mosetta Pigeon, MD  polyethylene glycol powder (GLYCOLAX/MIRALAX) 17 GM/SCOOP powder Take 17 g by mouth 2 (two) times daily. Patient taking differently: Take 17 g by mouth daily as needed for severe constipation. 03/22/23  Yes Rhetta Mura, MD  senna-docusate (SENOKOT-S) 8.6-50 MG tablet Take 1 tablet by mouth 2 (two) times daily. Patient taking differently: Take 1 tablet by mouth daily as needed for moderate constipation. 03/22/23  Yes Rhetta Mura, MD  torsemide (DEMADEX) 20 MG tablet Take 2 tablets (40 mg total) by mouth 2 (two) times daily. 02/08/23  Yes Robbie Lis M, PA-C  atorvastatin (LIPITOR) 80 MG tablet Take 1 tablet (80 mg total) by mouth daily. Patient not taking: Reported on 09/14/2023 01/25/23 03/13/24  Arrien, York Ram, MD  blood glucose meter kit and supplies Dispense based on patient and insurance preference. Use up to four times daily as directed. (FOR ICD-10 E10.9, E11.9). 05/24/22   de [place], Buren Kos, MD  clindamycin (CLEOCIN) 150 MG capsule Take 1 capsule (150 mg total) by mouth 4 (four) times daily. 09/09/23   Judithann Sheen, PA     Physical Exam: Vitals:   09/14/23 0500 09/14/23 0530 09/14/23 0600 09/14/23 0630  BP: (!) 144/85 135/69 134/78 (!) 140/85  Pulse: 90 88 93 95  Resp:  15 15 13   Temp:   98.5 F (36.9 C)   TempSrc:      SpO2: 98% 97% 96% 94%  Weight:      Height:       Constitutional: Obese middle-age male currently in no acute distress Eyes: PERRL, lids and conjunctivae normal ENMT: Mucous membranes are moist. Posterior pharynx clear of any exudate or lesions.Normal dentition.  Neck: normal, supple  Respiratory: clear to auscultation bilaterally, no wheezing, no crackles. Normal respiratory effort. No accessory muscle use.  Cardiovascular: Regular rate and rhythm, no murmurs / rubs / gallops. No extremity edema. 2+ pedal pulses.   Abdomen: no tenderness, no masses palpated.  Bowel sounds positive.  Musculoskeletal: no clubbing / cyanosis. No joint deformity upper and lower extremities. Good ROM, no contractures. Normal muscle tone.  Skin: significant erythema noted of the upper left with induration appreciated, but no drainage.  Neurologic: CN 2-12 grossly intact. Strength 5/5 in all 4.  Psychiatric: Normal judgment and  insight. Alert and oriented x 3. Normal mood.   Data Reviewed:  EKG revealed normal sinus rhythm at 89 bpm with T wave inversion in lateral leads no longer appreciated.  As noted on prior EKG.  Reviewed labs, imaging, and pertinent records as documented.  Assessment and Plan:  Cellulitis and possible abscess of the back Acute.  Patient presents with complaints of back pain found to have significant erythema of the upper left-hand side of his back with induration present on physical exam.  He had recently been treated last month for cellulitis of his back with doxycycline with improvement in those areas, but subsequently had been treated with clindamycin recently without improvement in symptoms. -Admit to medical telemetry bed -Follow-up blood culture -Check MRSA screen -Add  on lactic acid, CRP, ESR -Continue vancomycin per pharmacy -Added on Rocephin and metronidazole -Hydrocodone/fentanyl IV as needed for moderate to severe pain respectively -Educated patient on need of better blood glucose control and taking intermittent dilute bleach baths to help decrease recurrence of infections  Leukocytosis Acute.  WBC elevated at 12.5.  Suspect secondary to above. -Recheck CBC tomorrow morning  Uncontrolled diabetes mellitus type 2 with hyperglycemia, with long-term use of insulin On admission glucose elevated at 277 without elevated anion gap.Marland Kitchen  Hemoglobin A1c noted to be 10.8 when checked 4 days ago. -Hypoglycemia protocols -Pharmacy substitution of Semglee increased to 25 units twice daily -CBGs before every meal with moderate SSI -Adjust insulin regimen as needed -Diabetic education consulted, we will follow-up for any further recommendations  Chronic kidney disease stage III Creatinine noted to be 2.21 with BUN 29 on admission and repeat check 2.1 today.  Baseline creatinine appears to range around 2.  He is not followed by nephrology in the outpatient setting. -Continue to monitor kidney function -Needs referral for nephrology in the outpatient setting  Anemia of chronic disease. Acute on chronic.  Hemoglobin 11.9-> 10.3 which appears to be lower than prior checked  -Recheck CBC in a.m.  Essential hypertension Blood pressures initially elevated up to 177/98.  Home blood pressure regimen includes lisinopril. -Question need to discontinue lisinopril -Restart amlodipine 10 mg daily -Hydralazine IV as needed for elevated blood pressures  Protein calorie malnutrition Chronic.  Albumin noted to be 2. -Add-on prealbumin  Delusional dreams Patient reported having weird dreams while on clindamycin unclear cause of symptoms. -Discontinued clindamycin  Hyperlipidemia Patient previously had been on atorvastatin, but reports it was discontinued by the  provider.  History of DVT Patient with prior left lower extremity peroneal vein DVT in 01/2023.  He had reportedly had intermittent compliance with anticoagulation.  Records show a repeat Doppler ultrasound of the bilateral lower extremities which noted no signs of a DVT on 04/18/2023.  History of pulmonary nodule Patient previously had been noted to have a 9 mm left lower lobe pulmonary nodule for which it appears he has a repeat CT scan at this chest that have been previously scheduled for tomorrow by Dr. Delton Coombes.  Obesity OSA BMI 36.49 kg/m.  Patient reports history of OSA but is not on CPAP at night. -Patient should follow-up with primary for referral for sleep study  DVT prophylaxis: Lovenox Advance Care Planning:   Code Status: Full Code    Consults: Surgery  Family Communication: None  Severity of Illness: The appropriate patient status for this patient is INPATIENT. Inpatient status is judged to be reasonable and necessary in order to provide the required intensity of service to ensure the patient's safety. The patient's presenting  symptoms, physical exam findings, and initial radiographic and laboratory data in the context of their chronic comorbidities is felt to place them at high risk for further clinical deterioration. Furthermore, it is not anticipated that the patient will be medically stable for discharge from the hospital within 2 midnights of admission.   * I certify that at the point of admission it is my clinical judgment that the patient will require inpatient hospital care spanning beyond 2 midnights from the point of admission due to high intensity of service, high risk for further deterioration and high frequency of surveillance required.*  Author: Clydie Braun, MD 09/14/2023 7:27 AM  For on call review [email or web address].

## 2023-09-14 NOTE — ED Notes (Signed)
Pt O2 saturation drop to 80s while sleeping. Pt reports he believes he has sleep apnea but is not diagnosed. Endorses SOB. This RN raised head of the bed and placed pt on 2L [place] while patient rests. O2 saturations now 100%.

## 2023-09-14 NOTE — ED Notes (Signed)
Patient c/o chest tightness x1 week - EKG captured & MD updated.

## 2023-09-14 NOTE — ED Notes (Signed)
Breakfast tray ordered for patient at this time.

## 2023-09-14 NOTE — Progress Notes (Signed)
ED Pharmacy Antibiotic Sign Off An antibiotic consult was received from an ED provider for Vancomycin  per pharmacy dosing for cellulitis. A chart review was completed to assess appropriateness.   The following one time order(s) were placed:  Vancomycin 2000 mg IV  Further antibiotic and/or antibiotic pharmacy consults should be ordered by the admitting provider if indicated.   Thank you for allowing pharmacy to be a part of this patient's care.   Eddie Candle, [facility]  Clinical Pharmacist 09/14/23 12:24 AM

## 2023-09-14 NOTE — Progress Notes (Signed)
Pharmacy Antibiotic Note  Leroy Simmons is a 52 y.o. male admitted on 09/13/2023 with cellulitis.  Pharmacy has been consulted for Vancomycin dosing. WBC increased, outpatient treatment not working, noted renal dysfunction.   Plan: Vancomycin 2000 mg IV x 1, then 1000 mg IV q24h >>>Estimated AUC: 503 Trend WBC, temp, renal function  F/U infectious work-up Drug levels as indicated   Height: 5\' 8"  (172.7 cm) Weight: 108.9 kg (240 lb) IBW/kg (Calculated) : 68.4  Temp (24hrs), Avg:99 F (37.2 C), Min:98.1 F (36.7 C), Max:99.6 F (37.6 C)  Recent Labs  Lab 09/09/23 1546 09/13/23 1424 09/13/23 1429  WBC 10.5 14.5*  --   CREATININE 1.97* 2.21*  --   LATICACIDVEN  --   --  0.8    Estimated Creatinine Clearance: 47.9 mL/min (A) (by C-G formula based on SCr of 2.21 mg/dL (H)).    No Known Allergies  Abran Duke, PharmD, BCPS Clinical Pharmacist Phone: [phone]

## 2023-09-14 NOTE — ED Notes (Addendum)
MD Howerter reviewed EKG & chest X-ray - modified Tylenol & Fentanyl order; no additional orders at this time.

## 2023-09-14 NOTE — Progress Notes (Signed)
  Carryover admission to the Day Admitter.  I discussed this case with the EDP, Barrie Dunker, PA.  Per these discussions:   This is a 52 year old male with prior hospitalization for sepsis due to cellulitis, who is being admitted this evening for cellulitis of the back, refractory to outpatient oral antibiotics.  The patient conveys that he has been experiencing intermittent boils on his back over the course of the last month, which initially improved with doxycycline.  However, over the last 2 weeks, he has noted worsening erythema, pain, associated with his back, prompting him to present to the emergency department on 09/09/2023, at which time he was prescribed clindamycin for early cellulitis of the back.  He reports good interval compliance with his clindamycin, but notes ensuing worsening of the associated erythema, pain involving his back, prompting him to present back to the emergency department this evening.  He is reported to have been previously hospitalized for sepsis due to cellulitis.  His presenting labs are notable for interval increase in white blood cell count, now 14,500.  Blood cultures x 2 were collected and the patient was started on IV vancomycin in the ED.  I have placed an order for inpatient admission to med/tele for further evaluation and management of the above.  I have placed some additional preliminary admit orders via the adult multi-morbid admission order set. I have also ordered additional IV vancomycin, prn IV fentanyl.  Also ordered morning labs in the form of CMP, CBC, magnesium level.    Newton Pigg, DO Hospitalist

## 2023-09-14 NOTE — Plan of Care (Signed)
  Problem: Education: Goal: Ability to describe self-care measures that may prevent or decrease complications (Diabetes Survival Skills Education) will improve Outcome: Progressing   Problem: Coping: Goal: Ability to adjust to condition or change in health will improve Outcome: Progressing   Problem: Nutritional: Goal: Maintenance of adequate nutrition will improve Outcome: Progressing   Problem: Activity: Goal: Risk for activity intolerance will decrease Outcome: Progressing

## 2023-09-14 NOTE — ED Provider Notes (Signed)
[place] [facility] AT [facility] Provider Note   CSN: [id number] Arrival date & time: 09/13/23  1238     History  Chief Complaint  Patient presents with   Abscess    Leroy Simmons is a 52 y.o. male.  Patient with past medical history significant for hypertension, CHF, type II DM, anxiety, sepsis due to cellulitis presents to the emergency department complaining of continued rash/cellulitis to his back.  Patient was evaluated on both November 8 and December 7 of this year for an infection.  Patient was prescribed doxycycline on November 8 and states he had a significant improvement in symptoms.  On December 7 he came to the emergency department because symptoms had returned.  At this time since he had recently completed doxycycline he was prescribed clindamycin.  He states he has been taking that as directed since the seventh.  His infection has apparently worsened.  Patient also complains of "delirium", strange dreams, and shortness of breath.  He denies chest pain, abdominal pain, nausea, vomiting, fevers.   Abscess      Home Medications Prior to Admission medications   Medication Sig Start Date End Date Taking? Authorizing Provider  albuterol (VENTOLIN HFA) 108 (90 Base) MCG/ACT inhaler Inhale 2 puffs into the lungs every 6 (six) hours as needed for wheezing or shortness of breath. 01/13/23   Leslye Peer, MD  amLODipine (NORVASC) 10 MG tablet Take 1 tablet (10 mg total) by mouth daily. 03/22/23   Rhetta Mura, MD  apixaban (ELIQUIS) 5 MG TABS tablet Take 1 tablet (5mg ) by mouth twice daily. Patient not taking: Reported on 06/01/2023 03/22/23   Rhetta Mura, MD  aspirin EC 81 MG tablet Take 81 mg by mouth daily.    [provider]  atorvastatin (LIPITOR) 80 MG tablet Take 1 tablet (80 mg total) by mouth daily. 01/25/23 03/13/24  Arrien, York Ram, MD  blood glucose meter kit and supplies Dispense based on patient and insurance  preference. Use up to four times daily as directed. (FOR ICD-10 E10.9, E11.9). 05/24/22   de [place], Buren Kos, MD  chlorhexidine (HIBICLENS) 4 % external liquid Apply topically daily as needed. 08/11/23   Halford Decamp, PA-C  clindamycin (CLEOCIN) 150 MG capsule Take 1 capsule (150 mg total) by mouth 4 (four) times daily. 09/09/23   Judithann Sheen, PA  insulin glargine (LANTUS) 100 unit/mL SOPN Inject 22 Units into the skin 2 (two) times daily. 09/09/23   Gloris Manchester, MD  Insulin Glargine Solostar (LANTUS) 100 UNIT/ML Solostar Pen Inject 22 Units into the skin 2 (two) times daily. 09/09/23   Gloris Manchester, MD  insulin lispro (HUMALOG KWIKPEN) 100 UNIT/ML KwikPen Inject 5 Units into the skin 3 (three) times daily before meals. Patient taking differently: Inject 5 Units into the skin 3 (three) times daily. 02/09/23   Grayce Sessions, NP  isosorbide mononitrate (IMDUR) 30 MG 24 hr tablet Take 1 tablet (30 mg total) by mouth daily. 01/24/23 03/13/24  Arrien, York Ram, MD  lisinopril (ZESTRIL) 20 MG tablet Take 20 mg by mouth daily.    [provider]  lisinopril (ZESTRIL) 20 MG tablet Take 1 tablet (20 mg total) by mouth daily. 10/19/22   Mosetta Pigeon, MD  metoprolol succinate (TOPROL-XL) 50 MG 24 hr tablet Take 50 mg by mouth daily. Take with or immediately following a meal.    [provider]  oxyCODONE (OXY IR/ROXICODONE) 5 MG immediate release tablet Take 1 tablet (5 mg  total) by mouth every 4 (four) hours as needed for moderate pain. Patient not taking: Reported on 06/01/2023 03/22/23   Rhetta Mura, MD  polyethylene glycol powder (GLYCOLAX/MIRALAX) 17 GM/SCOOP powder Take 17 g by mouth 2 (two) times daily. Patient taking differently: Take 17 g by mouth daily as needed for severe constipation. 03/22/23   Rhetta Mura, MD  senna-docusate (SENOKOT-S) 8.6-50 MG tablet Take 1 tablet by mouth 2 (two) times daily. Patient taking differently: Take 1 tablet by mouth  daily as needed for moderate constipation. 03/22/23   Rhetta Mura, MD  sulfamethoxazole-trimethoprim (BACTRIM DS) 800-160 MG tablet Take 1 tablet by mouth 2 (two) times daily. 05/27/23   Gilda Crease, MD  torsemide (DEMADEX) 20 MG tablet Take 2 tablets (40 mg total) by mouth 2 (two) times daily. 02/08/23   Allayne Butcher, PA-C      Allergies    Patient has no known allergies.    Review of Systems   Review of Systems  Physical Exam Updated Vital Signs BP (!) 145/86   Pulse 97   Temp 99.6 F (37.6 C)   Resp 16   Ht 5\' 8"  (1.727 m)   Wt 108.9 kg   SpO2 97%   BMI 36.49 kg/m  Physical Exam Vitals and nursing note reviewed.  Constitutional:      General: He is not in acute distress.    Appearance: Normal appearance.  HENT:     Head: Normocephalic and atraumatic.  Eyes:     Conjunctiva/sclera: Conjunctivae normal.  Cardiovascular:     Rate and Rhythm: Normal rate.     Pulses: Normal pulses.  Pulmonary:     Effort: Pulmonary effort is normal. No respiratory distress.     Breath sounds: Normal breath sounds.  Chest:     Chest wall: Tenderness present.  Abdominal:     Palpations: Abdomen is soft.     Tenderness: There is no abdominal tenderness.  Musculoskeletal:     Cervical back: Normal range of motion and neck supple. No rigidity.     Right lower leg: No edema.     Left lower leg: No edema.  Skin:    General: Skin is warm.     Capillary Refill: Capillary refill takes less than 2 seconds.     Coloration: Skin is not pale.     Comments: Multiple boils that appear infected with surrounding erythema and warmth that are TTP.  Large indurated area in the center of the back.  No active drainage nor fluctuance (see media)  Neurological:     Mental Status: He is alert.     ED Results / Procedures / Treatments   Labs (all labs ordered are listed, but only abnormal results are displayed) Labs Reviewed  COMPREHENSIVE METABOLIC PANEL - Abnormal; Notable  for the following components:      Result Value   Sodium 131 (*)    CO2 21 (*)    Glucose, Bld 282 (*)    BUN 29 (*)    Creatinine, Ser 2.21 (*)    Albumin 2.4 (*)    Alkaline Phosphatase 171 (*)    GFR, Estimated 35 (*)    All other components within normal limits  CBC WITH DIFFERENTIAL/PLATELET - Abnormal; Notable for the following components:   WBC 14.5 (*)    RBC 4.06 (*)    Hemoglobin 11.9 (*)    HCT 36.3 (*)    Neutro Abs 10.8 (*)    Monocytes Absolute 1.6 (*)  Abs Immature Granulocytes 0.09 (*)    All other components within normal limits  CULTURE, BLOOD (ROUTINE X 2)  CULTURE, BLOOD (ROUTINE X 2)  URINALYSIS, W/ REFLEX TO CULTURE (INFECTION SUSPECTED)  I-STAT CG4 LACTIC ACID, ED    EKG None  Radiology DG Chest 2 View  Result Date: 09/13/2023 CLINICAL DATA:  Left shoulder abscess. EXAM: CHEST - 2 VIEW COMPARISON:  X-ray 09/09/2023.  Older exams as well FINDINGS: Under penetrated underinflated x-ray. Enlarged cardiopericardial silhouette with vascular congestion. No pneumothorax, effusion or consolidation. Mild peribronchial thickening IMPRESSION: Underinflation.  Vascular congestion with peribronchial thickening. Electronically Signed   By: Karen Kays M.D.   On: 09/13/2023 15:48    Procedures Procedures    Medications Ordered in ED Medications  vancomycin (VANCOCIN) IVPB 1000 mg/200 mL premix (1,000 mg Intravenous New Bag/Given 09/14/23 0039)  ketorolac (TORADOL) 30 MG/ML injection 15 mg (15 mg Intravenous Given 09/14/23 0036)    ED Course/ Medical Decision Making/ A&P                                 Medical Decision Making Risk Prescription drug management. Decision regarding hospitalization.   This patient presents to the ED for concern of rash, this involves an extensive number of treatment options, and is a complaint that carries with it a high risk of complications and morbidity.  The differential diagnosis includes cellulitis, abscess, SJS,  TENS, others   Co morbidities that complicate the patient evaluation  History of cellulitis requiring hospitalization, type II DM   Additional history obtained:  Additional history obtained from chart review  Lab Tests:  I Ordered, and personally interpreted labs.  The pertinent results include: White count of 14,500, worsened from 10,505 days ago; lactic acid 0.8   Imaging Studies ordered:  I ordered imaging studies including shortness of breath I independently visualized and interpreted imaging which showed Underinflation.  Vascular congestion with peribronchial thickening  I agree with the radiologist interpretation   Consultations Obtained:  I requested consultation with the hospitalist, Dr.Howerter and discussed lab and imaging findings as well as pertinent plan - they recommend: admission   Problem List / ED Course / Critical interventions / Medication management   I ordered medication including Toradol for tenderness due to skin infection, vancomycin for antibiotic coverage Reevaluation of the patient after these medicines showed that the patient improved I have reviewed the patients home medicines and have made adjustments as needed   Social Determinants of Health:  Social Determinants of Health with Concerns   Food Insecurity: Food Insecurity Present (03/29/2023)   Hunger Vital Sign    Worried About Running Out of Food in the Last Year: Often true    Ran Out of Food in the Last Year: Often true  Transportation Needs: Unmet Transportation Needs (03/29/2023)   PRAPARE - Administrator, Civil Service (Medical): No    Lack of Transportation (Non-Medical): Yes  Physical Activity: Not on file  Stress: Not on file  Social Connections: Not on file  Depression (PHQ2-9): High Risk (06/01/2023)   Depression (PHQ2-9)    PHQ-2 Score: 14  Health Literacy: Not on file      Test / Admission - Considered:  Patient with worsening cellulitis, failing  outpatient therapy.  No sign of abscess, no fluctuance.  I do not see a drainable lesion at this time.  Based on chart review and previous imaging, central lesion appears to  be worsening.  Feel patient would benefit from admission for escalation to IV antibiotics.         Final Clinical Impression(s) / ED Diagnoses Final diagnoses:  Cellulitis of back except buttock    Rx / DC Orders ED Discharge Orders     None         Pamala Duffel 09/14/23 0059    Marily Memos, MD 09/15/23 [phone]

## 2023-09-14 NOTE — Plan of Care (Signed)
  Problem: Education: Goal: Ability to describe self-care measures that may prevent or decrease complications (Diabetes Survival Skills Education) will improve Outcome: Progressing Goal: Individualized Educational Video(s) Outcome: Progressing   Problem: Coping: Goal: Ability to adjust to condition or change in health will improve Outcome: Progressing   Problem: Fluid Volume: Goal: Ability to maintain a balanced intake and output will improve Outcome: Progressing   Problem: Health Behavior/Discharge Planning: Goal: Ability to identify and utilize available resources and services will improve Outcome: Progressing Goal: Ability to manage health-related needs will improve Outcome: Progressing   Problem: Metabolic: Goal: Ability to maintain appropriate glucose levels will improve Outcome: Progressing   Problem: Nutritional: Goal: Maintenance of adequate nutrition will improve Outcome: Progressing Goal: Progress toward achieving an optimal weight will improve Outcome: Progressing   Problem: Skin Integrity: Goal: Risk for impaired skin integrity will decrease Outcome: Progressing   Problem: Tissue Perfusion: Goal: Adequacy of tissue perfusion will improve Outcome: Progressing   Problem: Education: Goal: Knowledge of General Education information will improve Description: Including pain rating scale, medication(s)/side effects and non-pharmacologic comfort measures Outcome: Progressing   Problem: Health Behavior/Discharge Planning: Goal: Ability to manage health-related needs will improve Outcome: Progressing   Problem: Clinical Measurements: Goal: Ability to maintain clinical measurements within normal limits will improve Outcome: Progressing Goal: Will remain free from infection Outcome: Progressing Goal: Diagnostic test results will improve Outcome: Progressing Goal: Respiratory complications will improve Outcome: Progressing Goal: Cardiovascular complication will  be avoided Outcome: Progressing   Problem: Activity: Goal: Risk for activity intolerance will decrease Outcome: Progressing   Problem: Nutrition: Goal: Adequate nutrition will be maintained Outcome: Progressing   Problem: Coping: Goal: Level of anxiety will decrease Outcome: Progressing   Problem: Elimination: Goal: Will not experience complications related to bowel motility Outcome: Progressing Goal: Will not experience complications related to urinary retention Outcome: Progressing   Problem: Pain Management: Goal: General experience of comfort will improve Outcome: Progressing   Problem: Safety: Goal: Ability to remain free from injury will improve Outcome: Progressing   Problem: Skin Integrity: Goal: Risk for impaired skin integrity will decrease Outcome: Progressing   Problem: Clinical Measurements: Goal: Ability to avoid or minimize complications of infection will improve Outcome: Progressing   Problem: Skin Integrity: Goal: Skin integrity will improve Outcome: Progressing

## 2023-09-14 NOTE — Consult Note (Signed)
Reason for Consult:back cellulitis Referring Physician: Madelyn Flavors  Leroy Simmons is an 52 y.o. male.  HPI: 52yo M with PMHx DM, HTN and CHF had had several boils on his back over the past month. He was taking doxycycline at home.  The area on his back became worse and he was evaluated in the emergency department on 09/09/2023.  He was prescribed clindamycin for early cellulitis of the back.  He continued to worsen and return.  He has been admitted to the hospitalist service today and placed on IV antibiotics.  I was asked to evaluate him from a surgical standpoint regarding the cellulitis on his back.  He has an ultrasound ordered but it has not been done yet.  Patient complains of localized pain as well as some shortness of breath and what he describes as delusional thinking.  Past Medical History:  Diagnosis Date   Acute gangrenous appendicitis 09/25/2022   Anxiety    CHF (congestive heart failure) (HCC)    Depression    Diabetes mellitus without complication (HCC)    Hypertension     Past Surgical History:  Procedure Laterality Date   LAPAROSCOPIC APPENDECTOMY N/A 09/26/2022   Procedure: APPENDECTOMY LAPAROSCOPIC;  Surgeon: Campbell Lerner, MD;  Location: [facility] ORS;  Service: General;  Laterality: N/A;   LEFT HEART CATH AND CORONARY ANGIOGRAPHY N/A 03/21/2022   Procedure: LEFT HEART CATH AND CORONARY ANGIOGRAPHY;  Surgeon: Corky Crafts, MD;  Location: [facility] INVASIVE CV LAB;  Service: Cardiovascular;  Laterality: N/A;    Family History  Problem Relation Age of Onset   Heart failure Mother     Social History:  reports that he has never smoked. He has never used smokeless tobacco. He reports current alcohol use. He reports that he does not use drugs.  Allergies: No Known Allergies  Medications: I have reviewed the patient's current medications.  Results for orders placed or performed during the hospital encounter of 09/13/23 (from the past 48 hours)  Blood culture (routine  x 2)     Status: None (Preliminary result)   Collection Time: 09/13/23  2:01 PM   Specimen: BLOOD  Result Value Ref Range   Specimen Description BLOOD RIGHT ANTECUBITAL    Special Requests      BOTTLES DRAWN AEROBIC ONLY Blood Culture results may not be optimal due to an inadequate volume of blood received in culture bottles   Culture      NO GROWTH < 24 HOURS Performed at [facility] Lab, [address], [place], [place] [address]    Report Status PENDING   Comprehensive metabolic panel     Status: Abnormal   Collection Time: 09/13/23  2:24 PM  Result Value Ref Range   Sodium 131 (L) 135 - 145 mmol/L   Potassium 4.1 3.5 - 5.1 mmol/L   Chloride 101 98 - 111 mmol/L   CO2 21 (L) 22 - 32 mmol/L   Glucose, Bld 282 (H) 70 - 99 mg/dL    Comment: Glucose reference range applies only to samples taken after fasting for at least 8 hours.   BUN 29 (H) 6 - 20 mg/dL   Creatinine, Ser 1.91 (H) 0.61 - 1.24 mg/dL   Calcium 9.1 8.9 - 47.8 mg/dL   Total Protein 6.9 6.5 - 8.1 g/dL   Albumin 2.4 (L) 3.5 - 5.0 g/dL   AST 15 15 - 41 U/L   ALT 22 0 - 44 U/L   Alkaline Phosphatase 171 (H) 38 - 126 U/L  Total Bilirubin 0.5 <1.2 mg/dL   GFR, Estimated 35 (L) >60 mL/min    Comment: (NOTE) Calculated using the CKD-EPI Creatinine Equation (2021)    Anion gap 9 5 - 15    Comment: Performed at [facility] Lab, [address] [place], [place] [address]  CBC with Differential     Status: Abnormal   Collection Time: 09/13/23  2:24 PM  Result Value Ref Range   WBC 14.5 (H) 4.0 - 10.5 K/uL   RBC 4.06 (L) 4.22 - 5.81 MIL/uL   Hemoglobin 11.9 (L) 13.0 - 17.0 g/dL   HCT 84.6 (L) 96.2 - 95.2 %   MCV 89.4 80.0 - 100.0 fL   MCH 29.3 26.0 - 34.0 pg   MCHC 32.8 30.0 - 36.0 g/dL   RDW 84.1 32.4 - 40.1 %   Platelets 302 150 - 400 K/uL   nRBC 0.0 0.0 - 0.2 %   Neutrophils Relative % 74 %   Neutro Abs 10.8 (H) 1.7 - 7.7 K/uL   Lymphocytes Relative 12 %   Lymphs Abs 1.7 0.7 - 4.0 K/uL   Monocytes  Relative 11 %   Monocytes Absolute 1.6 (H) 0.1 - 1.0 K/uL   Eosinophils Relative 2 %   Eosinophils Absolute 0.3 0.0 - 0.5 K/uL   Basophils Relative 0 %   Basophils Absolute 0.1 0.0 - 0.1 K/uL   Immature Granulocytes 1 %   Abs Immature Granulocytes 0.09 (H) 0.00 - 0.07 K/uL    Comment: Performed at [facility] Lab, [address] [place], [place] [address]  I-Stat Lactic Acid, ED     Status: None   Collection Time: 09/13/23  2:29 PM  Result Value Ref Range   Lactic Acid, Venous 0.8 0.5 - 1.9 mmol/L  Blood culture (routine x 2)     Status: None (Preliminary result)   Collection Time: 09/13/23 11:42 PM   Specimen: BLOOD RIGHT ARM  Result Value Ref Range   Specimen Description BLOOD RIGHT ARM    Special Requests      BOTTLES DRAWN AEROBIC AND ANAEROBIC Blood Culture adequate volume   Culture      NO GROWTH < 12 HOURS Performed at [facility], [address] [place], [place] [address]    Report Status PENDING   Urinalysis, w/ Reflex to Culture (Infection Suspected) -Urine, Clean Catch     Status: Abnormal   Collection Time: 09/14/23  2:47 AM  Result Value Ref Range   Specimen Source URINE, CLEAN CATCH    Color, Urine YELLOW YELLOW   APPearance HAZY (A) CLEAR   Specific Gravity, Urine 1.023 1.005 - 1.030   pH 5.0 5.0 - 8.0   Glucose, UA >=500 (A) NEGATIVE mg/dL   Hgb urine dipstick SMALL (A) NEGATIVE   Bilirubin Urine NEGATIVE NEGATIVE   Ketones, ur NEGATIVE NEGATIVE mg/dL   Protein, ur >=034 (A) NEGATIVE mg/dL   Nitrite NEGATIVE NEGATIVE   Leukocytes,Ua NEGATIVE NEGATIVE   RBC / HPF 6-10 0 - 5 RBC/hpf   WBC, UA 6-10 0 - 5 WBC/hpf    Comment:        Reflex urine culture not performed if WBC <=10, OR if Squamous epithelial cells >5. If Squamous epithelial cells >5 suggest recollection.    Bacteria, UA RARE (A) NONE SEEN   Squamous Epithelial / HPF 0-5 0 - 5 /HPF   Mucus PRESENT    Hyaline Casts, UA PRESENT     Comment: Performed at [facility],  [address] [place], [place] [address]  CBC with Differential/Platelet     Status: Abnormal   Collection Time: 09/14/23  5:06 AM  Result Value Ref Range   WBC 12.5 (H) 4.0 - 10.5 K/uL   RBC 3.43 (L) 4.22 - 5.81 MIL/uL   Hemoglobin 10.3 (L) 13.0 - 17.0 g/dL   HCT 13.2 (L) 44.0 - 10.2 %   MCV 88.0 80.0 - 100.0 fL   MCH 30.0 26.0 - 34.0 pg   MCHC 34.1 30.0 - 36.0 g/dL   RDW 72.5 36.6 - 44.0 %   Platelets 264 150 - 400 K/uL   nRBC 0.0 0.0 - 0.2 %   Neutrophils Relative % 70 %   Neutro Abs 8.8 (H) 1.7 - 7.7 K/uL   Lymphocytes Relative 16 %   Lymphs Abs 2.0 0.7 - 4.0 K/uL   Monocytes Relative 10 %   Monocytes Absolute 1.2 (H) 0.1 - 1.0 K/uL   Eosinophils Relative 3 %   Eosinophils Absolute 0.3 0.0 - 0.5 K/uL   Basophils Relative 0 %   Basophils Absolute 0.1 0.0 - 0.1 K/uL   Immature Granulocytes 1 %   Abs Immature Granulocytes 0.06 0.00 - 0.07 K/uL    Comment: Performed at [facility], [address], [place], [place] [address]  Comprehensive metabolic panel     Status: Abnormal   Collection Time: 09/14/23  5:06 AM  Result Value Ref Range   Sodium 133 (L) 135 - 145 mmol/L   Potassium 3.9 3.5 - 5.1 mmol/L   Chloride 102 98 - 111 mmol/L   CO2 22 22 - 32 mmol/L   Glucose, Bld 277 (H) 70 - 99 mg/dL    Comment: Glucose reference range applies only to samples taken after fasting for at least 8 hours.   BUN 31 (H) 6 - 20 mg/dL   Creatinine, Ser 5.95 (H) 0.61 - 1.24 mg/dL   Calcium 8.4 (L) 8.9 - 10.3 mg/dL   Total Protein 5.8 (L) 6.5 - 8.1 g/dL   Albumin 2.0 (L) 3.5 - 5.0 g/dL   AST 14 (L) 15 - 41 U/L   ALT 19 0 - 44 U/L   Alkaline Phosphatase 138 (H) 38 - 126 U/L   Total Bilirubin 0.6 <1.2 mg/dL   GFR, Estimated 38 (L) >60 mL/min    Comment: (NOTE) Calculated using the CKD-EPI Creatinine Equation (2021)    Anion gap 9 5 - 15    Comment: Performed at [facility], [address], [place], [place] [address]  Magnesium     Status: None   Collection Time: 09/14/23  5:06  AM  Result Value Ref Range   Magnesium 2.0 1.7 - 2.4 mg/dL    Comment: Performed at [facility] Lab, [address] [place], [place] [address]  Sedimentation rate     Status: Abnormal   Collection Time: 09/14/23  5:06 AM  Result Value Ref Range   Sed Rate 90 (H) 0 - 16 mm/hr    Comment: Performed at [facility], [address], [place], [place] [address]  Prealbumin     Status: Abnormal   Collection Time: 09/14/23  5:06 AM  Result Value Ref Range   Prealbumin 14 (L) 18 - 38 mg/dL    Comment: Performed at [facility] Lab, [address], [place], [place] [address]  MRSA Next Gen by PCR, Nasal     Status: None   Collection Time:  09/14/23  6:52 AM   Specimen: Nasal Mucosa; Nasal Swab  Result Value Ref Range   MRSA by PCR Next Gen NOT DETECTED NOT DETECTED    Comment: (NOTE) The GeneXpert MRSA Assay (FDA approved for NASAL specimens only), is one component of a comprehensive MRSA colonization surveillance program. It is not intended to diagnose MRSA infection nor to guide or monitor treatment for MRSA infections. Test performance is not FDA approved in patients less than 75 years old. Performed at [facility] Lab, [address], [place], [place] [address]   CBG monitoring, ED     Status: Abnormal   Collection Time: 09/14/23  8:04 AM  Result Value Ref Range   Glucose-Capillary 249 (H) 70 - 99 mg/dL    Comment: Glucose reference range applies only to samples taken after fasting for at least 8 hours.   Comment 1 Notify RN    Comment 2 Document in Chart   Glucose, capillary     Status: Abnormal   Collection Time: 09/14/23 11:37 AM  Result Value Ref Range   Glucose-Capillary 262 (H) 70 - 99 mg/dL    Comment: Glucose reference range applies only to samples taken after fasting for at least 8 hours.  Lactic acid, plasma     Status: None   Collection Time: 09/14/23 11:49 AM  Result Value Ref Range   Lactic Acid, Venous 1.0 0.5 - 1.9 mmol/L    Comment: Performed at  [facility] Lab, [address] [place], [place] [address]  C-reactive protein     Status: Abnormal   Collection Time: 09/14/23 11:49 AM  Result Value Ref Range   CRP 5.1 (H) <1.0 mg/dL    Comment: Performed at [facility] Lab, [address], [place], [place] [address]  Glucose, capillary     Status: Abnormal   Collection Time: 09/14/23  4:53 PM  Result Value Ref Range   Glucose-Capillary 228 (H) 70 - 99 mg/dL    Comment: Glucose reference range applies only to samples taken after fasting for at least 8 hours.    DG Chest 2 View Result Date: 09/13/2023 CLINICAL DATA:  Left shoulder abscess. EXAM: CHEST - 2 VIEW COMPARISON:  X-ray 09/09/2023.  Older exams as well FINDINGS: Under penetrated underinflated x-ray. Enlarged cardiopericardial silhouette with vascular congestion. No pneumothorax, effusion or consolidation. Mild peribronchial thickening IMPRESSION: Underinflation.  Vascular congestion with peribronchial thickening. Electronically Signed   By: Karen Kays M.D.   On: 09/13/2023 15:48    Review of Systems  Constitutional:  Positive for appetite change.  HENT: Negative.    Eyes: Negative.   Respiratory:  Positive for shortness of breath.   Cardiovascular:  Negative for chest pain.  Gastrointestinal: Negative.   Endocrine: Negative.   Genitourinary: Negative.   Musculoskeletal:        Pain in area of cellulitis on his back  Allergic/Immunologic: Negative.   Neurological: Negative.   Hematological: Negative.   Psychiatric/Behavioral:         Questionable hallucinations   Blood pressure 131/83, pulse 83, temperature 98.4 F (36.9 C), temperature source Oral, resp. rate 15, height 5\' 8"  (1.727 m), weight 108.9 kg, SpO2 96%. Physical Exam HENT:     Head: Normocephalic.  Eyes:     Pupils: Pupils are equal, round, and reactive to light.  Cardiovascular:     Rate and Rhythm: Normal rate and regular rhythm.     Pulses: Normal pulses.  Pulmonary:     Effort: Pulmonary  effort is normal. No respiratory distress.     Breath sounds: Normal breath sounds.  Abdominal:     General: Abdomen is flat.     Palpations: Abdomen is soft.     Tenderness: There is no abdominal tenderness.  Musculoskeletal:     Comments: Upper back to the left of midline has a 6 cm round area of erythema, there is no clear fluctuance or fluid collection, he has a couple other scattered boils cephalad to this area  Skin:    Comments: See above  Neurological:     Mental Status: He is alert and oriented to person, place, and time.     Assessment/Plan: Cellulitis upper back -agree with medical admission, IV Rocephin and vancomycin.  There is an ultrasound pending.  We will follow-up on that.  There is no obvious fluid collection to drain on physical exam, however the ultrasound may be more revealing.  I will make him NPO after midnight in case this needs to be drained in the operating room tomorrow.  I discussed the plan with him in detail and answered his questions. DM HTN CHF  Liz Malady 09/14/2023, 5:38 PM

## 2023-09-15 ENCOUNTER — Other Ambulatory Visit: Payer: 59

## 2023-09-15 ENCOUNTER — Encounter (HOSPITAL_COMMUNITY)
Admission: EM | Disposition: A | Discharge status: Home Health | Payer: Self-pay | Source: Home / Self Care | Attending: Internal Medicine

## 2023-09-15 ENCOUNTER — Inpatient Hospital Stay (HOSPITAL_COMMUNITY): Payer: 59 | Admitting: Certified Registered Nurse Anesthetist

## 2023-09-15 ENCOUNTER — Encounter (HOSPITAL_COMMUNITY): Payer: Self-pay | Admitting: Internal Medicine

## 2023-09-15 ENCOUNTER — Other Ambulatory Visit: Payer: Self-pay

## 2023-09-15 DIAGNOSIS — L03312 Cellulitis of back [any part except buttock]: Secondary | ICD-10-CM | POA: Diagnosis not present

## 2023-09-15 DIAGNOSIS — N1831 Chronic kidney disease, stage 3a: Secondary | ICD-10-CM | POA: Diagnosis not present

## 2023-09-15 DIAGNOSIS — E1165 Type 2 diabetes mellitus with hyperglycemia: Secondary | ICD-10-CM | POA: Diagnosis not present

## 2023-09-15 DIAGNOSIS — L02212 Cutaneous abscess of back [any part, except buttock]: Secondary | ICD-10-CM

## 2023-09-15 DIAGNOSIS — D72829 Elevated white blood cell count, unspecified: Secondary | ICD-10-CM | POA: Diagnosis not present

## 2023-09-15 HISTORY — PX: INCISION AND DRAINAGE ABSCESS: SHX5864

## 2023-09-15 LAB — CBC
HCT: 33.5 % — ABNORMAL LOW (ref 39.0–52.0)
Hemoglobin: 11.2 g/dL — ABNORMAL LOW (ref 13.0–17.0)
MCH: 29.3 pg (ref 26.0–34.0)
MCHC: 33.4 g/dL (ref 30.0–36.0)
MCV: 87.7 fL (ref 80.0–100.0)
Platelets: 272 10*3/uL (ref 150–400)
RBC: 3.82 MIL/uL — ABNORMAL LOW (ref 4.22–5.81)
RDW: 12.1 % (ref 11.5–15.5)
WBC: 13.2 10*3/uL — ABNORMAL HIGH (ref 4.0–10.5)
nRBC: 0 % (ref 0.0–0.2)

## 2023-09-15 LAB — COMPREHENSIVE METABOLIC PANEL
ALT: 16 U/L (ref 0–44)
AST: 11 U/L — ABNORMAL LOW (ref 15–41)
Albumin: 2.1 g/dL — ABNORMAL LOW (ref 3.5–5.0)
Alkaline Phosphatase: 146 U/L — ABNORMAL HIGH (ref 38–126)
Anion gap: 7 (ref 5–15)
BUN: 37 mg/dL — ABNORMAL HIGH (ref 6–20)
CO2: 22 mmol/L (ref 22–32)
Calcium: 8.7 mg/dL — ABNORMAL LOW (ref 8.9–10.3)
Chloride: 107 mmol/L (ref 98–111)
Creatinine, Ser: 2.45 mg/dL — ABNORMAL HIGH (ref 0.61–1.24)
GFR, Estimated: 31 mL/min — ABNORMAL LOW (ref >60)
Glucose, Bld: 243 mg/dL — ABNORMAL HIGH (ref 70–99)
Potassium: 4.4 mmol/L (ref 3.5–5.1)
Sodium: 136 mmol/L (ref 135–145)
Total Bilirubin: 0.6 mg/dL (ref <1.2)
Total Protein: 5.7 g/dL — ABNORMAL LOW (ref 6.5–8.1)

## 2023-09-15 LAB — GLUCOSE, CAPILLARY
Glucose-Capillary: 174 mg/dL — ABNORMAL HIGH (ref 70–99)
Glucose-Capillary: 181 mg/dL — ABNORMAL HIGH (ref 70–99)
Glucose-Capillary: 189 mg/dL — ABNORMAL HIGH (ref 70–99)
Glucose-Capillary: 330 mg/dL — ABNORMAL HIGH (ref 70–99)
Glucose-Capillary: 408 mg/dL — ABNORMAL HIGH (ref 70–99)
Glucose-Capillary: 419 mg/dL — ABNORMAL HIGH (ref 70–99)
Glucose-Capillary: 453 mg/dL — ABNORMAL HIGH (ref 70–99)

## 2023-09-15 LAB — GLUCOSE, RANDOM: Glucose, Bld: 478 mg/dL — ABNORMAL HIGH (ref 70–99)

## 2023-09-15 LAB — VANCOMYCIN, RANDOM: Vancomycin Rm: 16 ug/mL

## 2023-09-15 SURGERY — INCISION AND DRAINAGE, ABSCESS
Anesthesia: General | Site: Back

## 2023-09-15 MED ORDER — MIDAZOLAM HCL 2 MG/2ML IJ SOLN
INTRAMUSCULAR | Status: AC
Start: 2023-09-15 — End: ?
  Filled 2023-09-15: qty 2

## 2023-09-15 MED ORDER — CHLORHEXIDINE GLUCONATE 0.12 % MT SOLN
OROMUCOSAL | Status: AC
  Administered 2023-09-15: 15 mL via OROMUCOSAL
  Filled 2023-09-15: qty 15

## 2023-09-15 MED ORDER — FENTANYL CITRATE (PF) 250 MCG/5ML IJ SOLN
INTRAMUSCULAR | Status: AC
  Filled 2023-09-15: qty 5

## 2023-09-15 MED ORDER — BUPIVACAINE-EPINEPHRINE (PF) 0.25% -1:200000 IJ SOLN
INTRAMUSCULAR | Status: AC
  Filled 2023-09-15: qty 30

## 2023-09-15 MED ORDER — PHENYLEPHRINE HCL-NACL 20-0.9 MG/250ML-% IV SOLN
INTRAVENOUS | Status: DC | PRN
  Administered 2023-09-15 (×2): 160 ug via INTRAVENOUS

## 2023-09-15 MED ORDER — ROCURONIUM BROMIDE 10 MG/ML (PF) SYRINGE
PREFILLED_SYRINGE | INTRAVENOUS | Status: DC | PRN
  Administered 2023-09-15: 30 mg via INTRAVENOUS

## 2023-09-15 MED ORDER — LIDOCAINE 2% (20 MG/ML) 5 ML SYRINGE
INTRAMUSCULAR | Status: AC
  Filled 2023-09-15: qty 5

## 2023-09-15 MED ORDER — SUCCINYLCHOLINE CHLORIDE 200 MG/10ML IV SOSY
PREFILLED_SYRINGE | INTRAVENOUS | Status: DC | PRN
  Administered 2023-09-15: 120 mg via INTRAVENOUS

## 2023-09-15 MED ORDER — FENTANYL CITRATE (PF) 250 MCG/5ML IJ SOLN
INTRAMUSCULAR | Status: DC | PRN
  Administered 2023-09-15: 50 ug via INTRAVENOUS
  Administered 2023-09-15: 100 ug via INTRAVENOUS

## 2023-09-15 MED ORDER — PROPOFOL 10 MG/ML IV BOLUS
INTRAVENOUS | Status: AC
  Filled 2023-09-15: qty 20

## 2023-09-15 MED ORDER — DEXAMETHASONE SODIUM PHOSPHATE 10 MG/ML IJ SOLN
INTRAMUSCULAR | Status: AC
  Filled 2023-09-15: qty 1

## 2023-09-15 MED ORDER — ORAL CARE MOUTH RINSE: 15.0000 mL | Freq: Once | OROMUCOSAL | Status: AC

## 2023-09-15 MED ORDER — CHLORHEXIDINE GLUCONATE 0.12 % MT SOLN: 15.0000 mL | Freq: Once | OROMUCOSAL | Status: AC

## 2023-09-15 MED ORDER — SUGAMMADEX SODIUM 200 MG/2ML IV SOLN
INTRAVENOUS | Status: DC | PRN
  Administered 2023-09-15: 200 mg via INTRAVENOUS

## 2023-09-15 MED ORDER — ONDANSETRON HCL 4 MG/2ML IJ SOLN
INTRAMUSCULAR | Status: DC | PRN
  Administered 2023-09-15: 4 mg via INTRAVENOUS

## 2023-09-15 MED ORDER — 0.9 % SODIUM CHLORIDE (POUR BTL) OPTIME
TOPICAL | Status: DC | PRN
  Administered 2023-09-15: 1000 mL

## 2023-09-15 MED ORDER — INSULIN ASPART 100 UNIT/ML IJ SOLN
6.0000 [IU] | Freq: Once | INTRAMUSCULAR | Status: AC
  Administered 2023-09-15: 6 [IU] via SUBCUTANEOUS

## 2023-09-15 MED ORDER — SUCCINYLCHOLINE CHLORIDE 200 MG/10ML IV SOSY
PREFILLED_SYRINGE | INTRAVENOUS | Status: AC
  Filled 2023-09-15: qty 10

## 2023-09-15 MED ORDER — OXYCODONE HCL 5 MG/5ML PO SOLN: 5.0000 mg | Freq: Once | ORAL | Status: DC | PRN

## 2023-09-15 MED ORDER — ROCURONIUM BROMIDE 10 MG/ML (PF) SYRINGE
PREFILLED_SYRINGE | INTRAVENOUS | Status: AC
  Filled 2023-09-15: qty 10

## 2023-09-15 MED ORDER — SODIUM CHLORIDE 0.9 % IV SOLN: INTRAVENOUS | Status: DC

## 2023-09-15 MED ORDER — PROPOFOL 10 MG/ML IV BOLUS
INTRAVENOUS | Status: DC | PRN
  Administered 2023-09-15: 180 mg via INTRAVENOUS

## 2023-09-15 MED ORDER — LIDOCAINE 2% (20 MG/ML) 5 ML SYRINGE
INTRAMUSCULAR | Status: DC | PRN
  Administered 2023-09-15: 50 mg via INTRAVENOUS

## 2023-09-15 MED ORDER — BUPIVACAINE-EPINEPHRINE (PF) 0.25% -1:200000 IJ SOLN
INTRAMUSCULAR | Status: DC | PRN
  Administered 2023-09-15: 10 mL

## 2023-09-15 MED ORDER — ONDANSETRON HCL 4 MG/2ML IJ SOLN: 4.0000 mg | Freq: Four times a day (QID) | INTRAMUSCULAR | Status: DC | PRN

## 2023-09-15 MED ORDER — FENTANYL CITRATE (PF) 100 MCG/2ML IJ SOLN
25.0000 ug | INTRAMUSCULAR | Status: DC | PRN
  Administered 2023-09-15: 50 ug via INTRAVENOUS

## 2023-09-15 MED ORDER — MIDAZOLAM HCL 5 MG/5ML IJ SOLN
INTRAMUSCULAR | Status: DC | PRN
  Administered 2023-09-15: 2 mg via INTRAVENOUS

## 2023-09-15 MED ORDER — DEXAMETHASONE SODIUM PHOSPHATE 10 MG/ML IJ SOLN
INTRAMUSCULAR | Status: DC | PRN
  Administered 2023-09-15: 5 mg via INTRAVENOUS

## 2023-09-15 MED ORDER — FENTANYL CITRATE (PF) 100 MCG/2ML IJ SOLN
INTRAMUSCULAR | Status: AC
  Administered 2023-09-15: 50 ug via INTRAVENOUS
  Filled 2023-09-15: qty 2

## 2023-09-15 MED ORDER — OXYCODONE HCL 5 MG PO TABS: 5.0000 mg | ORAL_TABLET | Freq: Once | ORAL | Status: DC | PRN

## 2023-09-15 MED ORDER — ONDANSETRON HCL 4 MG/2ML IJ SOLN
INTRAMUSCULAR | Status: AC
Start: 2023-09-15 — End: ?
  Filled 2023-09-15: qty 2

## 2023-09-15 SURGICAL SUPPLY — 21 items
BLADE CLIPPER SURG (BLADE) IMPLANT
CANISTER SUCT 3000ML PPV (MISCELLANEOUS) ×2 IMPLANT
COVER SURGICAL LIGHT HANDLE (MISCELLANEOUS) ×1 IMPLANT
DRAPE LAPAROSCOPIC ABDOMINAL (DRAPES) IMPLANT
DRAPE LAPAROTOMY 100X72 PEDS (DRAPES) IMPLANT
ELECT CAUTERY BLADE 6.4 (BLADE) IMPLANT
ELECT REM PT RETURN 9FT ADLT (ELECTROSURGICAL) ×1
ELECTRODE REM PT RTRN 9FT ADLT (ELECTROSURGICAL) ×2 IMPLANT
GAUZE PAD ABD 8X10 STRL (GAUZE/BANDAGES/DRESSINGS) IMPLANT
GAUZE SPONGE 4X4 12PLY STRL (GAUZE/BANDAGES/DRESSINGS) IMPLANT
GLOVE BIO SURGEON STRL SZ7 (GLOVE) ×2 IMPLANT
GLOVE BIOGEL PI IND STRL 7.5 (GLOVE) ×2 IMPLANT
GOWN STRL REUS W/ TWL LRG LVL3 (GOWN DISPOSABLE) ×2 IMPLANT
KIT BASIN OR (CUSTOM PROCEDURE TRAY) ×2 IMPLANT
KIT TURNOVER KIT B (KITS) ×2 IMPLANT
NS IRRIG 1000ML POUR BTL (IV SOLUTION) ×1 IMPLANT
PACK GENERAL/GYN (CUSTOM PROCEDURE TRAY) ×1 IMPLANT
PAD ARMBOARD 7.5X6 YLW CONV (MISCELLANEOUS) ×2 IMPLANT
SWAB COLLECTION DEVICE MRSA (MISCELLANEOUS) IMPLANT
SWAB CULTURE ESWAB REG 1ML (MISCELLANEOUS) IMPLANT
TOWEL GREEN STERILE FF (TOWEL DISPOSABLE) ×1 IMPLANT

## 2023-09-15 NOTE — Anesthesia Preprocedure Evaluation (Signed)
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  TTE (01/2023): EF 60%, normal valve function.   Neuro/Psych  PSYCHIATRIC DISORDERS Anxiety Depression       GI/Hepatic   Endo/Other  diabetes, Type 2    Renal/GU Renal InsufficiencyRenal diseaseCr 2.45     Musculoskeletal   Abdominal   Peds  Hematology   Anesthesia Other Findings   Reproductive/Obstetrics                             Anesthesia Physical Anesthesia Plan  ASA: 3  Anesthesia Plan: General   Post-op Pain Management:    Induction: Intravenous  PONV Risk Score and Plan: 2 and Ondansetron, Dexamethasone, Midazolam and Treatment may vary due to age or medical condition  Airway Management Planned: Oral ETT  Additional Equipment:   Intra-op Plan:   Post-operative Plan: Extubation in OR  Informed Consent: I have reviewed the patients History and Physical, chart, labs and discussed the procedure including the risks, benefits and alternatives for the proposed anesthesia with the patient or authorized representative who has indicated his/her understanding and acceptance.     Dental advisory given  Plan Discussed with: CRNA, Anesthesiologist and Surgeon  Anesthesia Plan Comments:        Anesthesia Quick Evaluation

## 2023-09-15 NOTE — Transfer of Care (Signed)
Immediate Anesthesia Transfer of Care Note  Patient: Leroy Simmons  Procedure(s) Performed: INCISION AND DRAINAGE OF BACK ABSCESS  Patient Location: PACU  Anesthesia Type:General  Level of Consciousness: drowsy and patient cooperative  Airway & Oxygen Therapy: Patient Spontanous Breathing and Patient connected to face mask oxygen  Post-op Assessment: Report given to RN and Post -op Vital signs reviewed and stable  Post vital signs: Reviewed and stable  Last Vitals:  Vitals Value Taken Time  BP 103/75 09/15/23 1018  Temp    Pulse 83 09/15/23 1021  Resp 21 09/15/23 1021  SpO2 92 % 09/15/23 1021  Vitals shown include unfiled device data.  Last Pain:  Vitals:   09/15/23 0820  TempSrc:   PainSc: 8       Patients Stated Pain Goal: 0 (09/15/23 0820)  Complications:  Encounter Notable Events  Notable Event Outcome Phase Comment  Difficult to intubate - expected  Intraprocedure Filed from anesthesia note documentation.

## 2023-09-15 NOTE — Discharge Instructions (Signed)
WOUND CARE: - dressing to be changed daily - supplies: saline, 4x4 gauze, scissors, ABD pads, tape  - remove dressing and all packing carefully - clean edges of skin around the wound with water/gauze, making sure there is no tape debris or leakage left on skin that could cause skin irritation or breakdown. - dampen a clean gauze with saline and pack wound from wound base to skin level, making sure to take note of any possible areas of wound tracking, tunneling and packing appropriately. Wound can be packed loosely. - cover wound with a dry ABD pad and secure with tape.  - apply any skin protectant/powder recommended by clinician to protect skin/skin folds. - change dressing as needed if leakage occurs, wound gets contaminated, or patient requests to shower. - patient may shower daily with wound open and following the shower the wound should be dried and a clean dressing placed.

## 2023-09-15 NOTE — Progress Notes (Addendum)
[facility] Progress Note     Subjective: CC:  Reports increasing pain over his back for over one week. States the pain radiates from his back around to his chest wall. It is exacerbated by deep inspiration. Denies chest pain at rest or with mobility. Denies palpitations. Denies SOB. States he has had surgery before without complications related to anesthesia.   Objective: Vital signs in last 24 hours: Temp:  [98.4 F (36.9 C)-99.1 F (37.3 C)] 98.5 F (36.9 C) (12/13 0501) Pulse Rate:  [83-96] 90 (12/13 0501) Resp:  [11-18] 18 (12/13 0501) BP: (122-154)/(75-96) 153/89 (12/13 0501) SpO2:  [88 %-98 %] 97 % (12/13 0501) Last BM Date : 09/13/23  Intake/Output from previous day: 12/12 0701 - 12/13 0700 In: 700 [P.O.:600; I.V.:100] Out: 250 [Urine:250] Intake/Output this shift: No intake/output data recorded.  PE: Gen:  Alert, NAD, cooperative  Card:  Regular rate and rhythm, there is lower extremity edema that is symmetrical without cellulitis. Negative homan's sign. Pulm:  Normal effort ORA Abd: Soft, non-tender, non-distended Skin: warm and dry, there is a roughly 4x5 cm area of erythema and induration of his central upper back. There is surrounding/underlying fluctuance concerning for abscess. Psych: A&Ox3   Lab Results:  Recent Labs    09/14/23 0506 09/15/23 0638  WBC 12.5* 13.2*  HGB 10.3* 11.2*  HCT 30.2* 33.5*  PLT 264 272   BMET Recent Labs    09/13/23 1424 09/14/23 0506  NA 131* 133*  K 4.1 3.9  CL 101 102  CO2 21* 22  GLUCOSE 282* 277*  BUN 29* 31*  CREATININE 2.21* 2.10*  CALCIUM 9.1 8.4*   PT/INR No results for input(s): "LABPROT", "INR" in the last 72 hours. CMP     Component Value Date/Time   NA 133 (L) 09/14/2023 0506   NA 139 03/29/2022 1059   K 3.9 09/14/2023 0506   CL 102 09/14/2023 0506   CO2 22 09/14/2023 0506   GLUCOSE 277 (H) 09/14/2023 0506   BUN 31 (H) 09/14/2023 0506   BUN 29 (H) 03/29/2022 1059   CREATININE  2.10 (H) 09/14/2023 0506   CALCIUM 8.4 (L) 09/14/2023 0506   PROT 5.8 (L) 09/14/2023 0506   ALBUMIN 2.0 (L) 09/14/2023 0506   AST 14 (L) 09/14/2023 0506   ALT 19 09/14/2023 0506   ALKPHOS 138 (H) 09/14/2023 0506   BILITOT 0.6 09/14/2023 0506   GFRNONAA 38 (L) 09/14/2023 0506   GFRAA  10/26/2010 1143    >60        The eGFR has been calculated using the MDRD equation. This calculation has not been validated in all clinical situations. eGFR's persistently <60 mL/min signify possible Chronic Kidney Disease.   Lipase     Component Value Date/Time   LIPASE 39 09/25/2022 1439       Studies/Results: [place] CHEST SOFT TISSUE Result Date: 09/14/2023 CLINICAL DATA:  Suspected cutaneous abscess, midline upper back between the shoulder blades. EXAM: ULTRASOUND OF CHEST SOFT TISSUES TECHNIQUE: Ultrasound examination of the chest wall soft tissues was performed in the area of clinical concern. COMPARISON:  None Available. FINDINGS: The technologist describes an indurated, red painful area in the midline upper back between the shoulder blades but did not give measurements. Multiplanar images of the area of concern were obtained using a high-frequency linear transducer. No underlying fluid collection or hyperemic mass is seen. IMPRESSION: No fluid collection or hyperemic mass is seen in the area of concern. Electronically Signed   By: Mellody Dance  Chesser M.D.   On: 09/14/2023 23:58   DG Chest 2 View Result Date: 09/13/2023 CLINICAL DATA:  Left shoulder abscess. EXAM: CHEST - 2 VIEW COMPARISON:  X-ray 09/09/2023.  Older exams as well FINDINGS: Under penetrated underinflated x-ray. Enlarged cardiopericardial silhouette with vascular congestion. No pneumothorax, effusion or consolidation. Mild peribronchial thickening IMPRESSION: Underinflation.  Vascular congestion with peribronchial thickening. Electronically Signed   By: Karen Kays M.D.   On: 09/13/2023 15:48    Anti-infectives: Anti-infectives (From  admission, onward)    Start     Dose/Rate Route Frequency Ordered Stop   09/14/23 2200  vancomycin (VANCOCIN) IVPB 1000 mg/200 mL premix        1,000 mg 200 mL/hr over 60 Minutes Intravenous Every 24 hours 09/14/23 0323     09/14/23 1700  cefTRIAXone (ROCEPHIN) 2 g in sodium chloride 0.9 % 100 mL IVPB        2 g 200 mL/hr over 30 Minutes Intravenous Every 24 hours 09/14/23 1605     09/14/23 1700  metroNIDAZOLE (FLAGYL) IVPB 500 mg        500 mg 100 mL/hr over 60 Minutes Intravenous Every 12 hours 09/14/23 1605     09/14/23 0000  vancomycin (VANCOCIN) IVPB 1000 mg/200 mL premix        1,000 mg 200 mL/hr over 60 Minutes Intravenous Every 1 hr x 2 09/13/23 2358 09/14/23 0411        Assessment/Plan  Back cellulitis with clinical concern for abscess - afebrile, WBC increasing on abx 13.2 from 12.5  - hgb A1c is 10.8  - he has a history of CHF without evidence of current exacerbation. 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 Follow up lower extremity ultrasound on 04/2023 showed no evidence of lower extremity DVT. No clinical evidence of DVT or pulmonary embolus at present.   - recommend surgery today for I&D of back abscess.  The operative and non-operative management of abscess was discussed with the patient. Risks of surgery including bleeding, infection, drain placement, need for additional procedures, prolonged hospital stay, as well as the risks of general anesthesia were discussed with the patient and he would like to proceed with surgery. Questions were welcomed and answered.   - We discussed post-operative care. States he lives in community housing with a roommate who could help him with dressing changes/wound care. States he has a shower there to keep the wound clean.    LOS: 1 day   I reviewed nursing notes, hospitalist notes, last 24 h vitals and pain scores, last 48 h intake and output, last 24 h labs  and trends, and last 24 h imaging results.  This care required high  level of medical decision making.   Hosie Spangle, PA-C [facility] Please see Amion for pager number during day hours 7:00am-4:30pm

## 2023-09-15 NOTE — Plan of Care (Signed)
  Problem: Education: Goal: Ability to describe self-care measures that may prevent or decrease complications (Diabetes Survival Skills Education) will improve Outcome: Progressing Goal: Individualized Educational Video(s) Outcome: Progressing   Problem: Coping: Goal: Ability to adjust to condition or change in health will improve Outcome: Progressing   Problem: Fluid Volume: Goal: Ability to maintain a balanced intake and output will improve Outcome: Progressing   Problem: Health Behavior/Discharge Planning: Goal: Ability to identify and utilize available resources and services will improve Outcome: Progressing Goal: Ability to manage health-related needs will improve Outcome: Progressing   Problem: Metabolic: Goal: Ability to maintain appropriate glucose levels will improve Outcome: Progressing   Problem: Nutritional: Goal: Maintenance of adequate nutrition will improve Outcome: Progressing Goal: Progress toward achieving an optimal weight will improve Outcome: Progressing   Problem: Skin Integrity: Goal: Risk for impaired skin integrity will decrease Outcome: Progressing   Problem: Tissue Perfusion: Goal: Adequacy of tissue perfusion will improve Outcome: Progressing   Problem: Education: Goal: Knowledge of General Education information will improve Description: Including pain rating scale, medication(s)/side effects and non-pharmacologic comfort measures Outcome: Progressing   Problem: Health Behavior/Discharge Planning: Goal: Ability to manage health-related needs will improve Outcome: Progressing   Problem: Clinical Measurements: Goal: Ability to maintain clinical measurements within normal limits will improve Outcome: Progressing Goal: Will remain free from infection Outcome: Progressing Goal: Diagnostic test results will improve Outcome: Progressing Goal: Respiratory complications will improve Outcome: Progressing Goal: Cardiovascular complication will  be avoided Outcome: Progressing   Problem: Activity: Goal: Risk for activity intolerance will decrease Outcome: Progressing   Problem: Nutrition: Goal: Adequate nutrition will be maintained Outcome: Progressing   Problem: Coping: Goal: Level of anxiety will decrease Outcome: Progressing   Problem: Elimination: Goal: Will not experience complications related to bowel motility Outcome: Progressing Goal: Will not experience complications related to urinary retention Outcome: Progressing   Problem: Pain Management: Goal: General experience of comfort will improve Outcome: Progressing   Problem: Safety: Goal: Ability to remain free from injury will improve Outcome: Progressing   Problem: Skin Integrity: Goal: Risk for impaired skin integrity will decrease Outcome: Progressing   Problem: Clinical Measurements: Goal: Ability to avoid or minimize complications of infection will improve Outcome: Progressing   Problem: Skin Integrity: Goal: Skin integrity will improve Outcome: Progressing

## 2023-09-15 NOTE — Anesthesia Procedure Notes (Signed)
Procedure Name: Intubation Date/Time: 09/15/2023 9:30 AM  Performed by: Waynard Edwards, CRNAPre-anesthesia Checklist: Patient identified, Emergency Drugs available, Suction available, Patient being monitored and Timeout performed Patient Re-evaluated:Patient Re-evaluated prior to induction Oxygen Delivery Method: Circle system utilized Preoxygenation: Pre-oxygenation with 100% oxygen Induction Type: IV induction and Rapid sequence Laryngoscope Size: Glidescope and 4 Grade View: Grade I Tube type: Oral Tube size: 7.5 mm Number of attempts: 1 Airway Equipment and Method: Video-laryngoscopy and Rigid stylet Placement Confirmation: ETT inserted through vocal cords under direct vision, breath sounds checked- equal and bilateral and CO2 detector Secured at: 23 cm Tube secured with: Tape Dental Injury: Teeth and Oropharynx as per pre-operative assessment  Difficulty Due To: Difficulty was anticipated and Difficult Airway- due to large tongue

## 2023-09-15 NOTE — Inpatient Diabetes Management (Signed)
Inpatient Diabetes Program Recommendations  AACE/ADA: New Consensus Statement on Inpatient Glycemic Control (2015)  Target Ranges:  Prepandial:   less than 140 mg/dL      Peak postprandial:   less than 180 mg/dL (1-2 hours)      Critically ill patients:  140 - 180 mg/dL   Lab Results  Component Value Date   GLUCAP 174 (H) 09/15/2023   HGBA1C 10.8 (H) 09/09/2023    Review of Glycemic Control  Diabetes history: type 2 Outpatient Diabetes medications: Lantus 22 units BID, Humalog 5 units TID Current orders for Inpatient glycemic control: Semglee 25 units BID, Novolog 0-15 units correction scale TID, Novolog 0-5 units HS scale  Inpatient Diabetes Program Recommendations:   Spoke with patient about his diabetes. Discussed his HgbA1C of 10.8%. Patient states that his A1c had been down to 9% back in the summer and as high as 11% in the past. States that he takes Lantus 22 units BID and Humalog 5 units TID everyday at home. States that he will be seeing his PCP after discharge.   Will continue to monitor blood sugars while in the hospital.  Smith Mince RN BSN CDE Diabetes Coordinator Pager: [phone]  8am-5pm

## 2023-09-15 NOTE — Plan of Care (Signed)
  Problem: Education: Goal: Ability to describe self-care measures that may prevent or decrease complications (Diabetes Survival Skills Education) will improve Outcome: Progressing   Problem: Coping: Goal: Ability to adjust to condition or change in health will improve Outcome: Progressing   Problem: Activity: Goal: Risk for activity intolerance will decrease Outcome: Progressing   

## 2023-09-15 NOTE — Progress Notes (Signed)
PROGRESS NOTE    MITCHAL LAUTH  VHQ:[id number] DOB: December 08, 1970 DOA: 09/13/2023 PCP: Grayce Sessions, NP   Brief Narrative:  Leroy Simmons is a 52 y.o. male with medical history significant of hypertension, CHF, diabetes mellitus type 2, CKD stage III, anxiety, depression, history of DVT not on anticoagulation, and recurrent boils who presents with complaints of back pain from a somewhat chronic infection.   Assessment & Plan:   Principal Problem:   Cellulitis Active Problems:   Leukocytosis   Uncontrolled type 2 diabetes mellitus with hyperglycemia, with long-term current use of insulin (HCC)   Chronic kidney disease, stage 3a (HCC)   Anemia of chronic disease   Hypertension associated with diabetes (HCC)   Protein calorie malnutrition (HCC)   Left lower lobe pulmonary nodule   History of DVT (deep vein thrombosis)   Obesity (BMI 30-39.9)   Acute cellulitis with notable underlying abscess Surgery following, appreciate insight recommendations  I&D on 09/15/2023 successful without complication  -Does not meet sepsis criteria -CRP minimally elevated 5.1; ESR 90, Lactic WNL -Concern for resistant organism given previous treatment with doxycycline and clindamycin over the past few weeks to month -Continue ceftriaxone, Flagyl, vancomycin -escalate pending intraoperative cultures   Uncontrolled diabetes mellitus type 2 with hyperglycemia, with long-term use of insulin -A1c 10.8 -Continue sliding scale insulin, hypoglycemic protocol  Chronic kidney disease stage III -At baseline -continue to monitor I/O's and volume status   Anemia of chronic disease. Stable, no signs or symptoms of bleeding   Essential hypertension Uncontrolled, resume home amlodipine -Resume lisinopril if blood pressure not more adequately controlled in the next 24 hours   Protein calorie malnutrition Continue to advance diet as tolerated   Hyperlipidemia No longer on statin, reportedly diet  controlled   History of DVT, provoked -Previously on anticoagulation, negative studies July 2024, appears anticoagulation was discontinued at that time   History of pulmonary nodule Follow-up outpatient with Dr. Emilia Beck for repeat imaging   Obesity OSA BMI 36.49 kg/m.  Patient reports history of OSA but is not on CPAP at night. -Patient should follow-up with primary for referral for sleep study  DVT prophylaxis: enoxaparin (LOVENOX) injection 40 mg Start: 09/14/23 1000 SCDs Start: 09/14/23 0100  Code Status:   Code Status: Full Code  Family Communication: None present  Status is: Inpatient  Dispo: The patient is from: Home              Anticipated d/c is to: Home              Anticipated d/c date is: 24 to 48 hours              Patient currently not medically stable for discharge  Consultants:  General surgery  Procedures:  I&D 12/13  Antimicrobials:  Ceftriaxone, Flagyl, vancomycin  Subjective: No acute issues or events overnight  Objective: Vitals:   09/14/23 1139 09/14/23 1407 09/14/23 1557 09/15/23 0501  BP: (!) 147/96 (!) 140/92 131/83 (!) 153/89  Pulse: 91 89 83 90  Resp: 15 15  18   Temp:   98.4 F (36.9 C) 98.5 F (36.9 C)  TempSrc: Oral  Oral   SpO2: 96% 95% 96% 97%  Weight:      Height:        Intake/Output Summary (Last 24 hours) at 09/15/2023 0740 Last data filed at 09/14/2023 1743 Gross per 24 hour  Intake 700 ml  Output 250 ml  Net 450 ml   Filed Weights   09/13/23  1353  Weight: 108.9 kg    Examination:  General:  Pleasantly resting in bed, No acute distress. HEENT:  Normocephalic atraumatic.  Sclerae nonicteric, noninjected.  Extraocular movements intact bilaterally. Neck:  Without mass or deformity.  Trachea is midline. Lungs:  Clear to auscultate bilaterally without rhonchi, wheeze, or rales. Heart:  Regular rate and rhythm.  Without murmurs, rubs, or gallops. Abdomen:  Soft, nontender, nondistended.  Without guarding  or rebound. Extremities: Without cyanosis, clubbing, edema, or obvious deformity. Skin: Small fluctuant mass noted superior right upper back - medial to L scapula - does not cross the midline   Data Reviewed: I have personally reviewed following labs and imaging studies  CBC: Recent Labs  Lab 09/09/23 1546 09/13/23 1424 09/14/23 0506 09/15/23 0638  WBC 10.5 14.5* 12.5* 13.2*  NEUTROABS  --  10.8* 8.8*  --   HGB 12.1* 11.9* 10.3* 11.2*  HCT 36.4* 36.3* 30.2* 33.5*  MCV 89.7 89.4 88.0 87.7  PLT 247 302 264 272   Basic Metabolic Panel: Recent Labs  Lab 09/09/23 1546 09/13/23 1424 09/14/23 0506  NA 132* 131* 133*  K 4.2 4.1 3.9  CL 102 101 102  CO2 20* 21* 22  GLUCOSE 426* 282* 277*  BUN 29* 29* 31*  CREATININE 1.97* 2.21* 2.10*  CALCIUM 8.5* 9.1 8.4*  MG  --   --  2.0   GFR: Estimated Creatinine Clearance: 50.4 mL/min (A) (by C-G formula based on SCr of 2.1 mg/dL (H)). Liver Function Tests: Recent Labs  Lab 09/13/23 1424 09/14/23 0506  AST 15 14*  ALT 22 19  ALKPHOS 171* 138*  BILITOT 0.5 0.6  PROT 6.9 5.8*  ALBUMIN 2.4* 2.0*   No results for input(s): "LIPASE", "AMYLASE" in the last 168 hours. No results for input(s): "AMMONIA" in the last 168 hours. Coagulation Profile: No results for input(s): "INR", "PROTIME" in the last 168 hours. Cardiac Enzymes: No results for input(s): "CKTOTAL", "CKMB", "CKMBINDEX", "TROPONINI" in the last 168 hours. BNP (last 3 results) No results for input(s): "PROBNP" in the last 8760 hours. HbA1C: No results for input(s): "HGBA1C" in the last 72 hours. CBG: Recent Labs  Lab 09/14/23 0804 09/14/23 1137 09/14/23 1653 09/15/23 0019  GLUCAP 249* 262* 228* 330*   Lipid Profile: No results for input(s): "CHOL", "HDL", "LDLCALC", "TRIG", "CHOLHDL", "LDLDIRECT" in the last 72 hours. Thyroid Function Tests: No results for input(s): "TSH", "T4TOTAL", "FREET4", "T3FREE", "THYROIDAB" in the last 72 hours. Anemia Panel: No  results for input(s): "VITAMINB12", "FOLATE", "FERRITIN", "TIBC", "IRON", "RETICCTPCT" in the last 72 hours. Sepsis Labs: Recent Labs  Lab 09/13/23 1429 09/14/23 1149  LATICACIDVEN 0.8 1.0    Recent Results (from the past 240 hours)  Blood culture (routine x 2)     Status: None (Preliminary result)   Collection Time: 09/13/23  2:01 PM   Specimen: BLOOD  Result Value Ref Range Status   Specimen Description BLOOD RIGHT ANTECUBITAL  Final   Special Requests   Final    BOTTLES DRAWN AEROBIC ONLY Blood Culture results may not be optimal due to an inadequate volume of blood received in culture bottles   Culture   Final    NO GROWTH < 24 HOURS Performed at [facility], [address], [place], [place] [address]    Report Status PENDING  Incomplete  Blood culture (routine x 2)     Status: None (Preliminary result)   Collection Time: 09/13/23 11:42 PM   Specimen: BLOOD RIGHT ARM  Result Value  Ref Range Status   Specimen Description BLOOD RIGHT ARM  Final   Special Requests   Final    BOTTLES DRAWN AEROBIC AND ANAEROBIC Blood Culture adequate volume   Culture   Final    NO GROWTH < 12 HOURS Performed at [facility], [address] [place], [place] [address]    Report Status PENDING  Incomplete  MRSA Next Gen by PCR, Nasal     Status: None   Collection Time: 09/14/23  6:52 AM   Specimen: Nasal Mucosa; Nasal Swab  Result Value Ref Range Status   MRSA by PCR Next Gen NOT DETECTED NOT DETECTED Final    Comment: (NOTE) The GeneXpert MRSA Assay (FDA approved for NASAL specimens only), is one component of a comprehensive MRSA colonization surveillance program. It is not intended to diagnose MRSA infection nor to guide or monitor treatment for MRSA infections. Test performance is not FDA approved in patients less than 74 years old. Performed at [facility], [address] [place], [place] [address]          Radiology Studies: [place] CHEST SOFT TISSUE Result  Date: 09/14/2023 CLINICAL DATA:  Suspected cutaneous abscess, midline upper back between the shoulder blades. EXAM: ULTRASOUND OF CHEST SOFT TISSUES TECHNIQUE: Ultrasound examination of the chest wall soft tissues was performed in the area of clinical concern. COMPARISON:  None Available. FINDINGS: The technologist describes an indurated, red painful area in the midline upper back between the shoulder blades but did not give measurements. Multiplanar images of the area of concern were obtained using a high-frequency linear transducer. No underlying fluid collection or hyperemic mass is seen. IMPRESSION: No fluid collection or hyperemic mass is seen in the area of concern. Electronically Signed   By: Almira Bar M.D.   On: 09/14/2023 23:58   DG Chest 2 View Result Date: 09/13/2023 CLINICAL DATA:  Left shoulder abscess. EXAM: CHEST - 2 VIEW COMPARISON:  X-ray 09/09/2023.  Older exams as well FINDINGS: Under penetrated underinflated x-ray. Enlarged cardiopericardial silhouette with vascular congestion. No pneumothorax, effusion or consolidation. Mild peribronchial thickening IMPRESSION: Underinflation.  Vascular congestion with peribronchial thickening. Electronically Signed   By: Karen Kays M.D.   On: 09/13/2023 15:48        Scheduled Meds:  amLODipine  10 mg Oral Daily   enoxaparin (LOVENOX) injection  40 mg Subcutaneous Daily   insulin aspart  0-15 Units Subcutaneous TID WC   insulin aspart  0-5 Units Subcutaneous QHS   insulin glargine-yfgn  25 Units Subcutaneous BID   Continuous Infusions:  cefTRIAXone (ROCEPHIN)  IV Stopped (09/14/23 1843)   metronidazole 500 mg (09/15/23 0505)   vancomycin Stopped (09/14/23 2155)     LOS: 1 day   Time spent:  Azucena Fallen, DO [facility]  If 7PM-7AM, please contact night-coverage [email or web address]  09/15/2023, 7:40 AM

## 2023-09-15 NOTE — Op Note (Addendum)
Pre-op diagnosis: Subcutaneous abscess upper back Postop diagnosis: Subcutaneous abscess upper back (4 cm) Procedure performed: Incision and drainage of back abscess Surgeon:Jackson Fetters K Davie Claud Anesthesia: General Indications: This is a 52 year old with diabetes, hypertension, congestive heart failure who presents with abscess of the upper back.  Initially he was diagnosed with cellulitis and was prescribed antibiotics.  The tenderness and swelling worsened so he presents to the emergency department.  Imaging did not show any fluid collections but based on clinical examination, I recommended drainage of this area.  Description of procedure: The patient brought to the operating room and placed in supine position on the stretcher.  After adequate level general anesthesia was obtained, he was moved to a prone position on chest rolls.  His upper back was prepped with Betadine and draped sterile fashion.  A timeout was taken to ensure the proper patient and proper procedure.  The patient has a 5 cm area of thickening and induration.  There is a skin tag in this area which I removed.  We have tried local anesthetic around the entire area.  I made a 3 cm round incision in the center of the swelling.  We dissected down through very thick layer of dermis to the subcutaneous tissue.  We encountered a abscess measuring 4 cm in diameter.  We broke up all the loculations.  Cautery was used for hemostasis.  We irrigated the wound thoroughly and inspected for hemostasis.  We then packed the wound with saline moistened gauze.  A dry dressing is applied.  The patient is then moved back to a supine position.  He is extubated brought to recovery room in stable condition.  All sponge, instrument, and needle counts are correct.  Wilmon Arms. Corliss Skains, MD, [facility]  General Surgery   09/15/2023 10:03 AM

## 2023-09-16 DIAGNOSIS — D72829 Elevated white blood cell count, unspecified: Secondary | ICD-10-CM | POA: Diagnosis not present

## 2023-09-16 DIAGNOSIS — N1831 Chronic kidney disease, stage 3a: Secondary | ICD-10-CM | POA: Diagnosis not present

## 2023-09-16 DIAGNOSIS — L03312 Cellulitis of back [any part except buttock]: Secondary | ICD-10-CM | POA: Diagnosis not present

## 2023-09-16 DIAGNOSIS — E1165 Type 2 diabetes mellitus with hyperglycemia: Secondary | ICD-10-CM | POA: Diagnosis not present

## 2023-09-16 LAB — CBC
HCT: 34.1 % — ABNORMAL LOW (ref 39.0–52.0)
Hemoglobin: 11.7 g/dL — ABNORMAL LOW (ref 13.0–17.0)
MCH: 29.5 pg (ref 26.0–34.0)
MCHC: 34.3 g/dL (ref 30.0–36.0)
MCV: 86.1 fL (ref 80.0–100.0)
Platelets: 330 10*3/uL (ref 150–400)
RBC: 3.96 MIL/uL — ABNORMAL LOW (ref 4.22–5.81)
RDW: 12.1 % (ref 11.5–15.5)
WBC: 12.8 10*3/uL — ABNORMAL HIGH (ref 4.0–10.5)
nRBC: 0 % (ref 0.0–0.2)

## 2023-09-16 LAB — BASIC METABOLIC PANEL
Anion gap: 11 (ref 5–15)
BUN: 43 mg/dL — ABNORMAL HIGH (ref 6–20)
CO2: 20 mmol/L — ABNORMAL LOW (ref 22–32)
Calcium: 8.7 mg/dL — ABNORMAL LOW (ref 8.9–10.3)
Chloride: 102 mmol/L (ref 98–111)
Creatinine, Ser: 2.76 mg/dL — ABNORMAL HIGH (ref 0.61–1.24)
GFR, Estimated: 27 mL/min — ABNORMAL LOW (ref >60)
Glucose, Bld: 395 mg/dL — ABNORMAL HIGH (ref 70–99)
Potassium: 4.4 mmol/L (ref 3.5–5.1)
Sodium: 133 mmol/L — ABNORMAL LOW (ref 135–145)

## 2023-09-16 LAB — GLUCOSE, CAPILLARY
Glucose-Capillary: 357 mg/dL — ABNORMAL HIGH (ref 70–99)
Glucose-Capillary: 426 mg/dL — ABNORMAL HIGH (ref 70–99)
Glucose-Capillary: 359 mg/dL — ABNORMAL HIGH (ref 70–99)
Glucose-Capillary: 180 mg/dL — ABNORMAL HIGH (ref 70–99)
Glucose-Capillary: 188 mg/dL — ABNORMAL HIGH (ref 70–99)

## 2023-09-16 MED ORDER — POLYETHYLENE GLYCOL 3350 17 G PO PACK
17.0000 g | PACK | Freq: Two times a day (BID) | ORAL | Status: DC
  Administered 2023-09-16 – 2023-09-18 (×4): 17 g via ORAL
  Filled 2023-09-16 (×5): qty 1

## 2023-09-16 MED ORDER — INSULIN GLARGINE-YFGN 100 UNIT/ML ~~LOC~~ SOLN
35.0000 [IU] | Freq: Two times a day (BID) | SUBCUTANEOUS | Status: DC
  Administered 2023-09-16 (×2): 35 [IU] via SUBCUTANEOUS
  Filled 2023-09-16 (×4): qty 0.35

## 2023-09-16 MED ORDER — INSULIN ASPART 100 UNIT/ML IJ SOLN
0.0000 [IU] | Freq: Three times a day (TID) | INTRAMUSCULAR | Status: DC
  Administered 2023-09-16: 4 [IU] via SUBCUTANEOUS
  Administered 2023-09-16: 20 [IU] via SUBCUTANEOUS
  Administered 2023-09-17: 7 [IU] via SUBCUTANEOUS
  Administered 2023-09-17: 4 [IU] via SUBCUTANEOUS
  Administered 2023-09-17: 11 [IU] via SUBCUTANEOUS
  Administered 2023-09-18: 7 [IU] via SUBCUTANEOUS
  Administered 2023-09-18: 4 [IU] via SUBCUTANEOUS

## 2023-09-16 MED ORDER — SENNOSIDES-DOCUSATE SODIUM 8.6-50 MG PO TABS
1.0000 | ORAL_TABLET | Freq: Two times a day (BID) | ORAL | Status: DC
  Administered 2023-09-16 – 2023-09-18 (×5): 1 via ORAL
  Filled 2023-09-16 (×5): qty 1

## 2023-09-16 MED ORDER — INSULIN ASPART 100 UNIT/ML IJ SOLN
20.0000 [IU] | Freq: Once | INTRAMUSCULAR | Status: AC
  Administered 2023-09-16: 20 [IU] via SUBCUTANEOUS

## 2023-09-16 NOTE — Progress Notes (Addendum)
[facility] Progress Note  1 Day Post-Op  Subjective: CC:  Pain controlled. No reported issues overnight. Reports constipation   Objective: Vital signs in last 24 hours: Temp:  [97.8 F (36.6 C)-98.7 F (37.1 C)] 97.8 F (36.6 C) (12/14 0402) Pulse Rate:  [81-90] 89 (12/14 0402) Resp:  [11-20] 18 (12/14 0402) BP: (103-136)/(60-82) 136/82 (12/14 0402) SpO2:  [94 %-100 %] 98 % (12/14 0402) Weight:  [114.8 kg] 114.8 kg (12/14 0500) Last BM Date : 09/13/23  Intake/Output from previous day: 12/13 0701 - 12/14 0700 In: 1030 [P.O.:480; I.V.:350; IV Piggyback:200] Out: 700 [Urine:700] Intake/Output this shift: No intake/output data recorded.  PE: Gen:  Alert, NAD, pleasant Card:  Regular rate and rhythm Pulm:  Normal effort ORA Abd: Soft, non-tender, non-distended Back: dressing removed. Incision clean and dry. No bleeding. Mild surrounding cellulitis.  Psych: A&Ox3   Lab Results:  Recent Labs    09/15/23 0638 09/16/23 0525  WBC 13.2* 12.8*  HGB 11.2* 11.7*  HCT 33.5* 34.1*  PLT 272 330   BMET Recent Labs    09/15/23 0638 09/15/23 2212 09/16/23 0525  NA 136  --  133*  K 4.4  --  4.4  CL 107  --  102  CO2 22  --  20*  GLUCOSE 243* 478* 395*  BUN 37*  --  43*  CREATININE 2.45*  --  2.76*  CALCIUM 8.7*  --  8.7*   PT/INR No results for input(s): "LABPROT", "INR" in the last 72 hours. CMP     Component Value Date/Time   NA 133 (L) 09/16/2023 0525   NA 139 03/29/2022 1059   K 4.4 09/16/2023 0525   CL 102 09/16/2023 0525   CO2 20 (L) 09/16/2023 0525   GLUCOSE 395 (H) 09/16/2023 0525   BUN 43 (H) 09/16/2023 0525   BUN 29 (H) 03/29/2022 1059   CREATININE 2.76 (H) 09/16/2023 0525   CALCIUM 8.7 (L) 09/16/2023 0525   PROT 5.7 (L) 09/15/2023 0638   ALBUMIN 2.1 (L) 09/15/2023 0638   AST 11 (L) 09/15/2023 0638   ALT 16 09/15/2023 0638   ALKPHOS 146 (H) 09/15/2023 0638   BILITOT 0.6 09/15/2023 0638   GFRNONAA 27 (L) 09/16/2023 0525   GFRAA   10/26/2010 1143    >60        The eGFR has been calculated using the MDRD equation. This calculation has not been validated in all clinical situations. eGFR's persistently <60 mL/min signify possible Chronic Kidney Disease.   Lipase     Component Value Date/Time   LIPASE 39 09/25/2022 1439       Studies/Results: [place] CHEST SOFT TISSUE Result Date: 09/14/2023 CLINICAL DATA:  Suspected cutaneous abscess, midline upper back between the shoulder blades. EXAM: ULTRASOUND OF CHEST SOFT TISSUES TECHNIQUE: Ultrasound examination of the chest wall soft tissues was performed in the area of clinical concern. COMPARISON:  None Available. FINDINGS: The technologist describes an indurated, red painful area in the midline upper back between the shoulder blades but did not give measurements. Multiplanar images of the area of concern were obtained using a high-frequency linear transducer. No underlying fluid collection or hyperemic mass is seen. IMPRESSION: No fluid collection or hyperemic mass is seen in the area of concern. Electronically Signed   By: Almira Bar M.D.   On: 09/14/2023 23:58    Anti-infectives: Anti-infectives (From admission, onward)    Start     Dose/Rate Route Frequency Ordered Stop   09/14/23 2200  vancomycin (VANCOCIN)  IVPB 1000 mg/200 mL premix        1,000 mg 200 mL/hr over 60 Minutes Intravenous Every 24 hours 09/14/23 0323     09/14/23 1700  cefTRIAXone (ROCEPHIN) 2 g in sodium chloride 0.9 % 100 mL IVPB        2 g 200 mL/hr over 30 Minutes Intravenous Every 24 hours 09/14/23 1605     09/14/23 1700  metroNIDAZOLE (FLAGYL) IVPB 500 mg        500 mg 100 mL/hr over 60 Minutes Intravenous Every 12 hours 09/14/23 1605     09/14/23 0000  vancomycin (VANCOCIN) IVPB 1000 mg/200 mL premix        1,000 mg 200 mL/hr over 60 Minutes Intravenous Every 1 hr x 2 09/13/23 2358 09/14/23 0411        Assessment/Plan  Back abscess S/p I&D 12/13 Dr. Corliss Skains Cx w/ staph aureus   Continue RN wet-to-dry dressing changes q shift while in hospital.  Ok to change daily or twice daily at home. Shower with wound open. 7 days PO abx due to cellulitis.   CCS will sign off. Call as needed. Follow up provided.    LOS: 2 days   I reviewed nursing notes, hospitalist notes, last 24 h vitals and pain scores, last 48 h intake and output, last 24 h labs and trends, and last 24 h imaging results.  Hosie Spangle, PA-C [facility] Please see Amion for pager number during day hours 7:00am-4:30pm

## 2023-09-16 NOTE — Plan of Care (Signed)
  Problem: Education: Goal: Ability to describe self-care measures that may prevent or decrease complications (Diabetes Survival Skills Education) will improve Outcome: Progressing   Problem: Coping: Goal: Ability to adjust to condition or change in health will improve Outcome: Progressing   Problem: Health Behavior/Discharge Planning: Goal: Ability to identify and utilize available resources and services will improve Outcome: Progressing   Problem: Health Behavior/Discharge Planning: Goal: Ability to manage health-related needs will improve Outcome: Progressing   Problem: Skin Integrity: Goal: Risk for impaired skin integrity will decrease Outcome: Progressing

## 2023-09-16 NOTE — Progress Notes (Addendum)
PROGRESS NOTE    QUE LADUCA  NFA:[id number] DOB: 01-17-1971 DOA: 09/13/2023 PCP: Grayce Sessions, NP   Brief Narrative:  Leroy Simmons is a 52 y.o. male with medical history significant of hypertension, CHF, diabetes mellitus type 2, CKD stage III, anxiety, depression, history of DVT not on anticoagulation, and recurrent boils who presents with complaints of worsening back pain from a somewhat chronic/recurrent infection of the skin.   Assessment & Plan:   Principal Problem:   Cellulitis Active Problems:   Leukocytosis   Uncontrolled type 2 diabetes mellitus with hyperglycemia, with long-term current use of insulin (HCC)   Chronic kidney disease, stage 3a (HCC)   Anemia of chronic disease   Hypertension associated with diabetes (HCC)   Protein calorie malnutrition (HCC)   Left lower lobe pulmonary nodule   History of DVT (deep vein thrombosis)   Obesity (BMI 30-39.9)   Acute cellulitis with notable underlying abscess -Surgery previously following - I&D on 09/15/2023 successful without complication  -Does not meet sepsis criteria -Initial culture moderate staph, continue ceftriaxone and vancomycin (Flagyl discontinued) -Concern for resistant organism given previous treatment with PO doxycycline and clindamycin in the recent past with no improvement in symptoms   Uncontrolled diabetes mellitus type 2 with hyperglycemia, with long-term use of insulin -A1c 10.8 -Increase sliding scale to resistant, increase Semglee insulin to 35u BID  Constipation, moderate to severe  -Patient has worsening cramping abdominal pain distention and difficulty defecating -Likely complicated by narcotics given above recent procedure and prolonged antibiotic therapy -Initiate MiraLAX and Senokot scheduled twice daily, sitz bath ordered.  Low threshold to consider enema in the next few hours if no improvement in symptoms  Chronic kidney disease stage IIIa -At baseline -continue to monitor  I/O's and volume status   Anemia of chronic disease. Stable, no signs or symptoms of bleeding   Essential hypertension Uncontrolled, resume home amlodipine -Resume lisinopril if blood pressure not more adequately controlled in the next 24 hours   Protein calorie malnutrition Continue to advance diet as tolerated   Hyperlipidemia No longer on statin, reportedly diet controlled   History of DVT, provoked -Previously on anticoagulation, negative studies July 2024, appears anticoagulation was discontinued at that time   History of pulmonary nodule Follow-up outpatient with Dr. Emilia Beck for repeat imaging   Obesity OSA BMI 36.49 kg/m.  Patient reports history of OSA but is not on CPAP at night. -Patient should follow-up with primary for referral for sleep study  DVT prophylaxis: enoxaparin (LOVENOX) injection 40 mg Start: 09/14/23 1000 SCDs Start: 09/14/23 0100  Code Status:   Code Status: Full Code  Family Communication: None present  Status is: Inpatient  Dispo: The patient is from: Home              Anticipated d/c is to: Home              Anticipated d/c date is: 24 to 48 hours              Patient currently not medically stable for discharge  Consultants:  General surgery  Procedures:  I&D 12/13  Antimicrobials:  Ceftriaxone, Flagyl, vancomycin  Subjective: No acute issues or events overnight  Objective: Vitals:   09/15/23 1453 09/15/23 1952 09/16/23 0402 09/16/23 0500  BP: 125/77 110/65 136/82   Pulse: 85 84 89   Resp: 15 16 18    Temp: 98.7 F (37.1 C) 98.7 F (37.1 C) 97.8 F (36.6 C)   TempSrc: Oral Oral Oral  SpO2: 95% 96% 98%   Weight:    114.8 kg  Height:        Intake/Output Summary (Last 24 hours) at 09/16/2023 0735 Last data filed at 09/16/2023 0404 Gross per 24 hour  Intake 1030 ml  Output 700 ml  Net 330 ml   Filed Weights   09/13/23 1353 09/15/23 0820 09/16/23 0500  Weight: 108.9 kg 104.3 kg 114.8 kg     Examination:  General:  Pleasantly resting in bed, No acute distress. HEENT:  Normocephalic atraumatic.  Sclerae nonicteric, noninjected.  Extraocular movements intact bilaterally. Neck:  Without mass or deformity.  Trachea is midline. Lungs:  Clear to auscultate bilaterally without rhonchi, wheeze, or rales. Heart:  Regular rate and rhythm.  Without murmurs, rubs, or gallops. Abdomen:  Soft, nontender, nondistended.  Without guarding or rebound. Extremities: Without cyanosis, clubbing, edema, or obvious deformity. Skin: Small fluctuant mass noted superior right upper back - medial to L scapula - does not cross the midline   Data Reviewed: I have personally reviewed following labs and imaging studies  CBC: Recent Labs  Lab 09/09/23 1546 09/13/23 1424 09/14/23 0506 09/15/23 0638 09/16/23 0525  WBC 10.5 14.5* 12.5* 13.2* 12.8*  NEUTROABS  --  10.8* 8.8*  --   --   HGB 12.1* 11.9* 10.3* 11.2* 11.7*  HCT 36.4* 36.3* 30.2* 33.5* 34.1*  MCV 89.7 89.4 88.0 87.7 86.1  PLT 247 302 264 272 330   Basic Metabolic Panel: Recent Labs  Lab 09/09/23 1546 09/13/23 1424 09/14/23 0506 09/15/23 0638 09/15/23 2212 09/16/23 0525  NA 132* 131* 133* 136  --  133*  K 4.2 4.1 3.9 4.4  --  4.4  CL 102 101 102 107  --  102  CO2 20* 21* 22 22  --  20*  GLUCOSE 426* 282* 277* 243* 478* 395*  BUN 29* 29* 31* 37*  --  43*  CREATININE 1.97* 2.21* 2.10* 2.45*  --  2.76*  CALCIUM 8.5* 9.1 8.4* 8.7*  --  8.7*  MG  --   --  2.0  --   --   --    GFR: Estimated Creatinine Clearance: 39.4 mL/min (A) (by C-G formula based on SCr of 2.76 mg/dL (H)). Liver Function Tests: Recent Labs  Lab 09/13/23 1424 09/14/23 0506 09/15/23 0638  AST 15 14* 11*  ALT 22 19 16   ALKPHOS 171* 138* 146*  BILITOT 0.5 0.6 0.6  PROT 6.9 5.8* 5.7*  ALBUMIN 2.4* 2.0* 2.1*   No results for input(s): "LIPASE", "AMYLASE" in the last 168 hours. No results for input(s): "AMMONIA" in the last 168 hours. Coagulation  Profile: No results for input(s): "INR", "PROTIME" in the last 168 hours. Cardiac Enzymes: No results for input(s): "CKTOTAL", "CKMB", "CKMBINDEX", "TROPONINI" in the last 168 hours. BNP (last 3 results) No results for input(s): "PROBNP" in the last 8760 hours. HbA1C: No results for input(s): "HGBA1C" in the last 72 hours. CBG: Recent Labs  Lab 09/15/23 1021 09/15/23 1119 09/15/23 1647 09/15/23 1824 09/15/23 2025  GLUCAP 189* 174* 408* 453* 419*   Lipid Profile: No results for input(s): "CHOL", "HDL", "LDLCALC", "TRIG", "CHOLHDL", "LDLDIRECT" in the last 72 hours. Thyroid Function Tests: No results for input(s): "TSH", "T4TOTAL", "FREET4", "T3FREE", "THYROIDAB" in the last 72 hours. Anemia Panel: No results for input(s): "VITAMINB12", "FOLATE", "FERRITIN", "TIBC", "IRON", "RETICCTPCT" in the last 72 hours. Sepsis Labs: Recent Labs  Lab 09/13/23 1429 09/14/23 1149  LATICACIDVEN 0.8 1.0    Recent Results (from the past  240 hours)  Blood culture (routine x 2)     Status: None (Preliminary result)   Collection Time: 09/13/23  2:01 PM   Specimen: BLOOD  Result Value Ref Range Status   Specimen Description BLOOD RIGHT ANTECUBITAL  Final   Special Requests   Final    BOTTLES DRAWN AEROBIC ONLY Blood Culture results may not be optimal due to an inadequate volume of blood received in culture bottles   Culture   Final    NO GROWTH 2 DAYS Performed at [facility], [address], [place], [place] [address]    Report Status PENDING  Incomplete  Blood culture (routine x 2)     Status: None (Preliminary result)   Collection Time: 09/13/23 11:42 PM   Specimen: BLOOD RIGHT ARM  Result Value Ref Range Status   Specimen Description BLOOD RIGHT ARM  Final   Special Requests   Final    BOTTLES DRAWN AEROBIC AND ANAEROBIC Blood Culture adequate volume   Culture   Final    NO GROWTH 1 DAY Performed at [facility], [address], [place], [place] [address]    Report  Status PENDING  Incomplete  MRSA Next Gen by PCR, Nasal     Status: None   Collection Time: 09/14/23  6:52 AM   Specimen: Nasal Mucosa; Nasal Swab  Result Value Ref Range Status   MRSA by PCR Next Gen NOT DETECTED NOT DETECTED Final    Comment: (NOTE) The GeneXpert MRSA Assay (FDA approved for NASAL specimens only), is one component of a comprehensive MRSA colonization surveillance program. It is not intended to diagnose MRSA infection nor to guide or monitor treatment for MRSA infections. Test performance is not FDA approved in patients less than 13 years old. Performed at [facility], [address] [place], [place] [address]   Aerobic/Anaerobic Culture w Gram Stain (surgical/deep wound)     Status: None (Preliminary result)   Collection Time: 09/15/23  9:50 AM   Specimen: Path fluid; Body Fluid  Result Value Ref Range Status   Specimen Description WOUND  Final   Special Requests BACK ABSCESS  Final   Gram Stain   Final    ABUNDANT WBC PRESENT, PREDOMINANTLY PMN MODERATE GRAM POSITIVE COCCI IN CLUSTERS Performed at [facility], [address], [place], [place] [address]    Culture PENDING  Incomplete   Report Status PENDING  Incomplete         Radiology Studies: [place] CHEST SOFT TISSUE Result Date: 09/14/2023 CLINICAL DATA:  Suspected cutaneous abscess, midline upper back between the shoulder blades. EXAM: ULTRASOUND OF CHEST SOFT TISSUES TECHNIQUE: Ultrasound examination of the chest wall soft tissues was performed in the area of clinical concern. COMPARISON:  None Available. FINDINGS: The technologist describes an indurated, red painful area in the midline upper back between the shoulder blades but did not give measurements. Multiplanar images of the area of concern were obtained using a high-frequency linear transducer. No underlying fluid collection or hyperemic mass is seen. IMPRESSION: No fluid collection or hyperemic mass is seen in the area of concern.  Electronically Signed   By: Almira Bar M.D.   On: 09/14/2023 23:58        Scheduled Meds:  amLODipine  10 mg Oral Daily   enoxaparin (LOVENOX) injection  40 mg Subcutaneous Daily   insulin aspart  0-15 Units Subcutaneous TID WC   insulin aspart  0-5 Units Subcutaneous QHS   insulin  glargine-yfgn  25 Units Subcutaneous BID   Continuous Infusions:  cefTRIAXone (ROCEPHIN)  IV Stopped (09/15/23 1748)   metronidazole 500 mg (09/16/23 0500)   vancomycin 1,000 mg (09/15/23 2137)     LOS: 2 days   Time spent:  Azucena Fallen, DO [facility]  If 7PM-7AM, please contact night-coverage [email or web address]  09/16/2023, 7:35 AM

## 2023-09-17 ENCOUNTER — Encounter (HOSPITAL_COMMUNITY): Payer: Self-pay | Admitting: Surgery

## 2023-09-17 DIAGNOSIS — D72829 Elevated white blood cell count, unspecified: Secondary | ICD-10-CM | POA: Diagnosis not present

## 2023-09-17 DIAGNOSIS — N1831 Chronic kidney disease, stage 3a: Secondary | ICD-10-CM | POA: Diagnosis not present

## 2023-09-17 DIAGNOSIS — L03312 Cellulitis of back [any part except buttock]: Secondary | ICD-10-CM | POA: Diagnosis not present

## 2023-09-17 DIAGNOSIS — E1165 Type 2 diabetes mellitus with hyperglycemia: Secondary | ICD-10-CM | POA: Diagnosis not present

## 2023-09-17 DIAGNOSIS — L02212 Cutaneous abscess of back [any part, except buttock]: Secondary | ICD-10-CM

## 2023-09-17 LAB — CBC
HCT: 33.8 % — ABNORMAL LOW (ref 39.0–52.0)
Hemoglobin: 11.2 g/dL — ABNORMAL LOW (ref 13.0–17.0)
MCH: 29.3 pg (ref 26.0–34.0)
MCHC: 33.1 g/dL (ref 30.0–36.0)
MCV: 88.5 fL (ref 80.0–100.0)
Platelets: 344 10*3/uL (ref 150–400)
RBC: 3.82 MIL/uL — ABNORMAL LOW (ref 4.22–5.81)
RDW: 12.2 % (ref 11.5–15.5)
WBC: 9.6 10*3/uL (ref 4.0–10.5)
nRBC: 0 % (ref 0.0–0.2)

## 2023-09-17 LAB — BASIC METABOLIC PANEL
Anion gap: 8 (ref 5–15)
Anion gap: 10 (ref 5–15)
BUN: 42 mg/dL — ABNORMAL HIGH (ref 6–20)
BUN: 43 mg/dL — ABNORMAL HIGH (ref 6–20)
CO2: 22 mmol/L (ref 22–32)
CO2: 20 mmol/L — ABNORMAL LOW (ref 22–32)
Calcium: 8.8 mg/dL — ABNORMAL LOW (ref 8.9–10.3)
Calcium: 8.7 mg/dL — ABNORMAL LOW (ref 8.9–10.3)
Chloride: 106 mmol/L (ref 98–111)
Chloride: 106 mmol/L (ref 98–111)
Creatinine, Ser: 2.46 mg/dL — ABNORMAL HIGH (ref 0.61–1.24)
Creatinine, Ser: 2.28 mg/dL — ABNORMAL HIGH (ref 0.61–1.24)
GFR, Estimated: 31 mL/min — ABNORMAL LOW (ref >60)
GFR, Estimated: 34 mL/min — ABNORMAL LOW (ref >60)
Glucose, Bld: 219 mg/dL — ABNORMAL HIGH (ref 70–99)
Glucose, Bld: 256 mg/dL — ABNORMAL HIGH (ref 70–99)
Potassium: 4.1 mmol/L (ref 3.5–5.1)
Potassium: 4.3 mmol/L (ref 3.5–5.1)
Sodium: 136 mmol/L (ref 135–145)
Sodium: 136 mmol/L (ref 135–145)

## 2023-09-17 LAB — GLUCOSE, CAPILLARY
Glucose-Capillary: 284 mg/dL — ABNORMAL HIGH (ref 70–99)
Glucose-Capillary: 244 mg/dL — ABNORMAL HIGH (ref 70–99)
Glucose-Capillary: 170 mg/dL — ABNORMAL HIGH (ref 70–99)
Glucose-Capillary: 285 mg/dL — ABNORMAL HIGH (ref 70–99)

## 2023-09-17 MED ORDER — DOXYCYCLINE HYCLATE 100 MG PO TABS
100.0000 mg | ORAL_TABLET | Freq: Two times a day (BID) | ORAL | Status: DC
  Administered 2023-09-17 – 2023-09-18 (×3): 100 mg via ORAL
  Filled 2023-09-17 (×3): qty 1

## 2023-09-17 MED ORDER — INSULIN GLARGINE-YFGN 100 UNIT/ML ~~LOC~~ SOLN
45.0000 [IU] | Freq: Two times a day (BID) | SUBCUTANEOUS | Status: DC
  Administered 2023-09-17 – 2023-09-18 (×3): 45 [IU] via SUBCUTANEOUS
  Filled 2023-09-17 (×4): qty 0.45

## 2023-09-17 MED ORDER — FUROSEMIDE 10 MG/ML IJ SOLN
40.0000 mg | Freq: Once | INTRAMUSCULAR | Status: AC
Start: 2023-09-17 — End: 2023-09-17
  Administered 2023-09-17: 40 mg via INTRAVENOUS
  Filled 2023-09-17: qty 4

## 2023-09-17 MED ORDER — TORSEMIDE 20 MG PO TABS
40.0000 mg | ORAL_TABLET | Freq: Two times a day (BID) | ORAL | Status: DC
  Administered 2023-09-17 – 2023-09-18 (×2): 40 mg via ORAL
  Filled 2023-09-17 (×2): qty 2

## 2023-09-17 NOTE — Progress Notes (Addendum)
Pharmacy Antibiotic Note  Leroy Simmons is a 52 y.o. male admitted on 09/13/2023 with cellulitis and back abscess s/p I &D on 12/13.  Pharmacy has been consulted for Vancomycin dosing. WBC increased, outpatient treatment not working, noted renal dysfunction. Abscess cultures now returned as MRSA. Per surgery note, plan 7 days of therapy with PO abx. Will continue vancomycin at current dose until d/c'd per primary. Scr Improved some from admit.    Plan: Continue Vancomycin 1000 mg IV q24h >>>Estimated AUC: 503 Trend WBC, temp, renal function  F/U infectious work-up Drug levels if continue IV vanc past 24 hours   Height: 5\' 8"  (172.7 cm) Weight: 114.8 kg (253 lb) IBW/kg (Calculated) : 68.4  Temp (24hrs), Avg:98.2 F (36.8 C), Min:97.9 F (36.6 C), Max:98.5 F (36.9 C)  Recent Labs  Lab 09/13/23 1424 09/13/23 1429 09/14/23 0506 09/14/23 1149 09/15/23 0638 09/15/23 1242 09/16/23 0525 09/17/23 0655 09/17/23 0915  WBC 14.5*  --  12.5*  --  13.2*  --  12.8*  --  9.6  CREATININE 2.21*  --  2.10*  --  2.45*  --  2.76* 2.46* 2.28*  LATICACIDVEN  --  0.8  --  1.0  --   --   --   --   --   VANCORANDOM  --   --   --   --   --  16  --   --   --     Estimated Creatinine Clearance: 47.7 mL/min (A) (by C-G formula based on SCr of 2.28 mg/dL (H)).    No Known Allergies  Kermit Arnette A. Jeanella Craze, PharmD, BCPS, FNKF Clinical Pharmacist [place] Please utilize Amion for appropriate phone number to reach the unit pharmacist [facility])   Addendum:   D/w Dr. Natale Milch.  Will deescalate to PO Doxycycline 100mg  BID and d/c Vancomycin and Cedftriaxone IV.  Arval Brandstetter A. Jeanella Craze, PharmD, BCPS, FNKF Clinical Pharmacist [place] Please utilize Amion for appropriate phone number to reach the unit pharmacist [facility])

## 2023-09-17 NOTE — Progress Notes (Addendum)
PROGRESS NOTE    [name]  FAO:[id number] DOB: 10/18/1970 DOA: 09/13/2023 PCP: Grayce Sessions, NP   Brief Narrative:  AHMIRE Simmons is a 52 y.o. male with medical history significant of hypertension, CHF, diabetes mellitus type 2, CKD stage III, anxiety, depression, history of DVT not on anticoagulation, and recurrent boils who presents with complaints of worsening back pain from a somewhat chronic/recurrent infection of the skin.   Assessment & Plan:   Principal Problem:   Cellulitis Active Problems:   Leukocytosis   Uncontrolled type 2 diabetes mellitus with hyperglycemia, with long-term current use of insulin (HCC)   Chronic kidney disease, stage 3a (HCC)   Anemia of chronic disease   Hypertension associated with diabetes (HCC)   Protein calorie malnutrition (HCC)   Left lower lobe pulmonary nodule   History of DVT (deep vein thrombosis)   Obesity (BMI 30-39.9)  Acute cellulitis with notable underlying abscess -Surgery previously following - I&D on 09/15/2023 successful without complication, signed off in the interim -Does not meet sepsis criteria -Initial culture moderate staph, continue ceftriaxone and vancomycin (Flagyl discontinued) -Preliminary cultures positive for staph, MRSA -transition to doxycycline and follow for improvement   Uncontrolled diabetes mellitus type 2 with hyperglycemia, with long-term use of insulin -A1c 10.8 -Increase sliding scale to resistant, increase Semglee insulin to 35u BID  Constipation, moderate to severe, resolving -Patient has worsening cramping abdominal pain distention and difficulty defecating -Likely complicated by narcotics given above recent procedure and prolonged antibiotic therapy -Initiate MiraLAX and Senokot scheduled twice daily, sitz bath ordered.  Low threshold to consider enema in the next few hours if no improvement in symptoms  Chronic kidney disease stage IIIa -At baseline -continue to monitor I/O's and  volume status   Anemia of chronic disease. Stable, no signs or symptoms of bleeding   Essential hypertension Uncontrolled, resume home amlodipine -Resume lisinopril if blood pressure not more adequately controlled in the next 24 hours   Protein calorie malnutrition Continue to advance diet as tolerated   Hyperlipidemia No longer on statin, reportedly diet controlled   History of DVT, provoked -Previously on anticoagulation, negative studies July 2024, appears anticoagulation was discontinued at that time   Dependent edema, chronic  -Prior echo 2023 unremarkable for diastolic or systolic failure/dysfunction -Restart home torsemide, IV Lasix x 1 today  History of pulmonary nodule Follow-up outpatient with Dr. Emilia Beck for repeat imaging   Obesity OSA BMI 36.49 kg/m.  Patient reports history of OSA but is not on CPAP at night. -Patient should follow-up with primary for referral for sleep study  DVT prophylaxis: enoxaparin (LOVENOX) injection 40 mg Start: 09/14/23 1000 SCDs Start: 09/14/23 0100  Code Status:   Code Status: Full Code  Family Communication: None present  Status is: Inpatient  Dispo: The patient is from: Home              Anticipated d/c is to: Home              Anticipated d/c date is: 24 to 48 hours              Patient currently not medically stable for discharge  Consultants:  General surgery  Procedures:  I&D 12/13  Antimicrobials:  Ceftriaxone, Flagyl, vancomycin  Subjective: No acute issues or events overnight  Objective: Vitals:   09/16/23 0500 09/16/23 1732 09/16/23 1953 09/17/23 0451  BP:  125/89 139/85 (!) 149/91  Pulse:  91 83 83  Resp:   18 18  Temp:  97.9 F (36.6 C) 98.1 F (36.7 C) 98.2 F (36.8 C)  TempSrc:  Oral Oral Oral  SpO2:  100% 100% 99%  Weight: 114.8 kg     Height:        Intake/Output Summary (Last 24 hours) at 09/17/2023 0742 Last data filed at 09/17/2023 0414 Gross per 24 hour  Intake 458.49 ml   Output 825 ml  Net -366.51 ml   Filed Weights   09/13/23 1353 09/15/23 0820 09/16/23 0500  Weight: 108.9 kg 104.3 kg 114.8 kg    Examination:  General:  Pleasantly resting in bed, No acute distress. HEENT:  Normocephalic atraumatic.  Sclerae nonicteric, noninjected.  Extraocular movements intact bilaterally. Neck:  Without mass or deformity.  Trachea is midline. Lungs:  Clear to auscultate bilaterally without rhonchi, wheeze, or rales. Heart:  Regular rate and rhythm.  Without murmurs, rubs, or gallops. Abdomen:  Soft, nontender, nondistended.  Without guarding or rebound. Extremities: Without cyanosis, clubbing, edema, or obvious deformity. Skin: Small fluctuant mass noted superior right upper back - medial to L scapula - does not cross the midline   Data Reviewed: I have personally reviewed following labs and imaging studies  CBC: Recent Labs  Lab 09/13/23 1424 09/14/23 0506 09/15/23 0638 09/16/23 0525  WBC 14.5* 12.5* 13.2* 12.8*  NEUTROABS 10.8* 8.8*  --   --   HGB 11.9* 10.3* 11.2* 11.7*  HCT 36.3* 30.2* 33.5* 34.1*  MCV 89.4 88.0 87.7 86.1  PLT 302 264 272 330   Basic Metabolic Panel: Recent Labs  Lab 09/13/23 1424 09/14/23 0506 09/15/23 0638 09/15/23 2212 09/16/23 0525  NA 131* 133* 136  --  133*  K 4.1 3.9 4.4  --  4.4  CL 101 102 107  --  102  CO2 21* 22 22  --  20*  GLUCOSE 282* 277* 243* 478* 395*  BUN 29* 31* 37*  --  43*  CREATININE 2.21* 2.10* 2.45*  --  2.76*  CALCIUM 9.1 8.4* 8.7*  --  8.7*  MG  --  2.0  --   --   --    GFR: Estimated Creatinine Clearance: 39.4 mL/min (A) (by C-G formula based on SCr of 2.76 mg/dL (H)). Liver Function Tests: Recent Labs  Lab 09/13/23 1424 09/14/23 0506 09/15/23 0638  AST 15 14* 11*  ALT 22 19 16   ALKPHOS 171* 138* 146*  BILITOT 0.5 0.6 0.6  PROT 6.9 5.8* 5.7*  ALBUMIN 2.4* 2.0* 2.1*   No results for input(s): "LIPASE", "AMYLASE" in the last 168 hours. No results for input(s): "AMMONIA" in the  last 168 hours. Coagulation Profile: No results for input(s): "INR", "PROTIME" in the last 168 hours. Cardiac Enzymes: No results for input(s): "CKTOTAL", "CKMB", "CKMBINDEX", "TROPONINI" in the last 168 hours. BNP (last 3 results) No results for input(s): "PROBNP" in the last 8760 hours. HbA1C: No results for input(s): "HGBA1C" in the last 72 hours. CBG: Recent Labs  Lab 09/16/23 0810 09/16/23 1135 09/16/23 1313 09/16/23 1725 09/16/23 1954  GLUCAP 357* 426* 359* 180* 188*   Lipid Profile: No results for input(s): "CHOL", "HDL", "LDLCALC", "TRIG", "CHOLHDL", "LDLDIRECT" in the last 72 hours. Thyroid Function Tests: No results for input(s): "TSH", "T4TOTAL", "FREET4", "T3FREE", "THYROIDAB" in the last 72 hours. Anemia Panel: No results for input(s): "VITAMINB12", "FOLATE", "FERRITIN", "TIBC", "IRON", "RETICCTPCT" in the last 72 hours. Sepsis Labs: Recent Labs  Lab 09/13/23 1429 09/14/23 1149  LATICACIDVEN 0.8 1.0    Recent Results (from the past 240 hours)  Blood  culture (routine x 2)     Status: None (Preliminary result)   Collection Time: 09/13/23  2:01 PM   Specimen: BLOOD  Result Value Ref Range Status   Specimen Description BLOOD RIGHT ANTECUBITAL  Final   Special Requests   Final    BOTTLES DRAWN AEROBIC ONLY Blood Culture results may not be optimal due to an inadequate volume of blood received in culture bottles   Culture   Final    NO GROWTH 3 DAYS Performed at [facility], [address], [place], [place] [address]    Report Status PENDING  Incomplete  Blood culture (routine x 2)     Status: None (Preliminary result)   Collection Time: 09/13/23 11:42 PM   Specimen: BLOOD RIGHT ARM  Result Value Ref Range Status   Specimen Description BLOOD RIGHT ARM  Final   Special Requests   Final    BOTTLES DRAWN AEROBIC AND ANAEROBIC Blood Culture adequate volume   Culture   Final    NO GROWTH 2 DAYS Performed at [facility], [address],  [place], [place] [address]    Report Status PENDING  Incomplete  MRSA Next Gen by PCR, Nasal     Status: None   Collection Time: 09/14/23  6:52 AM   Specimen: Nasal Mucosa; Nasal Swab  Result Value Ref Range Status   MRSA by PCR Next Gen NOT DETECTED NOT DETECTED Final    Comment: (NOTE) The GeneXpert MRSA Assay (FDA approved for NASAL specimens only), is one component of a comprehensive MRSA colonization surveillance program. It is not intended to diagnose MRSA infection nor to guide or monitor treatment for MRSA infections. Test performance is not FDA approved in patients less than 24 years old. Performed at [facility], [address] [place], [place] [address]   Aerobic/Anaerobic Culture w Gram Stain (surgical/deep wound)     Status: None (Preliminary result)   Collection Time: 09/15/23  9:50 AM   Specimen: Path fluid; Body Fluid  Result Value Ref Range Status   Specimen Description WOUND  Final   Special Requests BACK ABSCESS  Final   Gram Stain   Final    ABUNDANT WBC PRESENT, PREDOMINANTLY PMN MODERATE GRAM POSITIVE COCCI IN CLUSTERS    Culture   Final    MODERATE STAPHYLOCOCCUS AUREUS SUSCEPTIBILITIES TO FOLLOW Performed at [facility], [address], [place], [place] [address]    Report Status PENDING  Incomplete         Radiology Studies: No results found.       Scheduled Meds:  amLODipine  10 mg Oral Daily   enoxaparin (LOVENOX) injection  40 mg Subcutaneous Daily   insulin aspart  0-20 Units Subcutaneous TID WC   insulin aspart  0-5 Units Subcutaneous QHS   insulin glargine-yfgn  35 Units Subcutaneous BID   polyethylene glycol  17 g Oral BID   senna-docusate  1 tablet Oral BID   Continuous Infusions:  cefTRIAXone (ROCEPHIN)  IV Stopped (09/16/23 1806)   vancomycin 1,000 mg (09/16/23 2241)     LOS: 3 days   Time spent:  Azucena Fallen, DO [facility]  If 7PM-7AM, please contact  night-coverage [email or web address]  09/17/2023, 7:42 AM

## 2023-09-18 ENCOUNTER — Other Ambulatory Visit (HOSPITAL_COMMUNITY): Payer: Self-pay

## 2023-09-18 ENCOUNTER — Other Ambulatory Visit: Payer: Self-pay

## 2023-09-18 ENCOUNTER — Ambulatory Visit (INDEPENDENT_AMBULATORY_CARE_PROVIDER_SITE_OTHER): Payer: 59 | Admitting: Primary Care

## 2023-09-18 DIAGNOSIS — M48062 Spinal stenosis, lumbar region with neurogenic claudication: Secondary | ICD-10-CM | POA: Diagnosis not present

## 2023-09-18 DIAGNOSIS — L02212 Cutaneous abscess of back [any part, except buttock]: Secondary | ICD-10-CM | POA: Diagnosis present

## 2023-09-18 LAB — CULTURE, BLOOD (ROUTINE X 2): Culture: NO GROWTH

## 2023-09-18 LAB — SURGICAL PATHOLOGY

## 2023-09-18 LAB — GLUCOSE, CAPILLARY
Glucose-Capillary: 186 mg/dL — ABNORMAL HIGH (ref 70–99)
Glucose-Capillary: 226 mg/dL — ABNORMAL HIGH (ref 70–99)
Glucose-Capillary: 184 mg/dL — ABNORMAL HIGH (ref 70–99)

## 2023-09-18 MED ORDER — LISINOPRIL 10 MG PO TABS
20.0000 mg | ORAL_TABLET | Freq: Every day | ORAL | 0 refills | Status: DC
  Filled 2023-09-18: qty 30, 15d supply, fill #0

## 2023-09-18 MED ORDER — INSULIN GLARGINE SOLOSTAR 100 UNIT/ML ~~LOC~~ SOPN
45.0000 [IU] | PEN_INJECTOR | Freq: Two times a day (BID) | SUBCUTANEOUS | 0 refills | Status: DC
  Filled 2023-09-18: qty 30, 33d supply, fill #0

## 2023-09-18 MED ORDER — DOXYCYCLINE HYCLATE 100 MG PO TABS
100.0000 mg | ORAL_TABLET | Freq: Two times a day (BID) | ORAL | 0 refills | Status: DC
  Filled 2023-09-18: qty 28, 14d supply, fill #0

## 2023-09-18 NOTE — Discharge Summary (Signed)
Physician Discharge Summary  Leroy Simmons EXB:[id number] DOB: Oct 11, 1970 DOA: 09/13/2023  PCP: Grayce Sessions, NP  Admit date: 09/13/2023 Discharge date: 09/18/2023  Admitted From: Home Disposition: Home  Recommendations for Outpatient Follow-up:  Follow up with PCP in 1-2 weeks Follow-up with general surgery in 1 to 2 weeks as scheduled for ongoing wound evaluation dressing changes  Home Health: Nursing Equipment/Devices: None  Discharge Condition: Stable CODE STATUS: Full Diet recommendation: Low-salt low-fat low-carb diet  Brief/Interim Summary: Leroy Simmons is a 52 y.o. male with medical history significant of hypertension, CHF, diabetes mellitus type 2, CKD stage III, anxiety, depression, history of DVT not on anticoagulation, and recurrent boils who presents with complaints of worsening back pain from a somewhat chronic/recurrent infection of the skin.  Patient admitted as above with worsening cellulitis of his left posterior spinal/scapular region, further evaluation revealed subcutaneous abscess which required I&D with general surgery on 09/15/2023.  Procedure was successful without complication.  Patient now tolerating p.o. with no further symptoms, pain well-controlled on current regimen, continue antibiotics as outlined below with doxycycline.  Follow-up with general surgery as scheduled otherwise clinically stable and agreeable for discharge home.      Discharge Diagnoses:  Principal Problem:   Abscess of upper back excluding scapular region Active Problems:   Cellulitis   Leukocytosis   Uncontrolled type 2 diabetes mellitus with hyperglycemia, with long-term current use of insulin (HCC)   Chronic kidney disease, stage 3a (HCC)   Anemia of chronic disease   Hypertension associated with diabetes (HCC)   Protein calorie malnutrition (HCC)   Left lower lobe pulmonary nodule   History of DVT (deep vein thrombosis)   Obesity (BMI 30-39.9)  Acute cellulitis  with notable underlying abscess  I&D on 09/15/2023 successful without complication, general surgery signed off in the interim -Does not meet sepsis criteria -Initial cultures positive for staph, MRSA positive, transition to doxycycline as above   Uncontrolled diabetes mellitus type 2 with hyperglycemia, with long-term use of insulin -A1c 10.8 -Increase home glargine to 45 unit twice daily   Constipation, moderate to severe, resolving Chronic kidney disease stage IIIa Anemia of chronic disease. Essential hypertension Protein calorie malnutrition Hyperlipidemia History of DVT, provoked Dependent edema, chronic  History of pulmonary nodule Obesity OSA -Continue previous home regimen  Discharge Instructions  Discharge Instructions     Face-to-face encounter (required for Medicare/Medicaid patients)   Complete by: As directed    I Azucena Fallen certify that this patient is under my care and that I, or a nurse practitioner or physician's assistant working with me, had a face-to-face encounter that meets the physician face-to-face encounter requirements with this patient on 09/18/2023. The encounter with the patient was in whole, or in part for the following medical condition(s) which is the primary reason for home health care (List medical condition): Complicated wound care, cellulitis, abscess.  Patient has midline sternal back wound which is not possible for him to dress or monitor himself due to location.   The encounter with the patient was in whole, or in part, for the following medical condition, which is the primary reason for home health care: Complicated wound care, cellulitis and abscess   I certify that, based on my findings, the following services are medically necessary home health services: Physical therapy   Reason for Medically Necessary Home Health Services:  Skilled Nursing- Change/Decline in Patient Status Skilled Nursing- Complex Wound Care     My clinical  findings support the need for  the above services: OTHER SEE COMMENTS   Further, I certify that my clinical findings support that this patient is homebound due to: Open/draining pressure/stasis ulcer   Home Health   Complete by: As directed    To provide the following care/treatments: RN      Allergies as of 09/18/2023   No Known Allergies      Medication List     TAKE these medications    Admelog SoloStar 100 UNIT/ML KwikPen Generic drug: insulin lispro Inject 5 Units into the skin 3 (three) times daily before meals. What changed: when to take this   albuterol 108 (90 Base) MCG/ACT inhaler Commonly known as: VENTOLIN HFA Inhale 2 puffs into the lungs every 6 (six) hours as needed for wheezing or shortness of breath.   amLODipine 10 MG tablet Commonly known as: NORVASC Take 1 tablet (10 mg total) by mouth daily.   blood glucose meter kit and supplies Dispense based on patient and insurance preference. Use up to four times daily as directed. (FOR ICD-10 E10.9, E11.9).   chlorhexidine 4 % external liquid Commonly known as: HIBICLENS Apply topically daily as needed.   doxycycline 100 MG tablet Commonly known as: VIBRA-TABS Take 1 tablet (100 mg total) by mouth every 12 (twelve) hours.   Insulin Glargine Solostar 100 UNIT/ML Solostar Pen Commonly known as: LANTUS Inject 45 Units into the skin 2 (two) times daily. What changed: how much to take   lisinopril 10 MG tablet Commonly known as: ZESTRIL Take 2 tablets (20 mg total) by mouth daily. What changed: medication strength   polyethylene glycol powder 17 GM/SCOOP powder Commonly known as: GLYCOLAX/MIRALAX Take 17 g by mouth 2 (two) times daily. What changed:  when to take this reasons to take this   Senexon-S 8.6-50 MG tablet Generic drug: senna-docusate Take 1 tablet by mouth 2 (two) times daily. What changed:  when to take this reasons to take this   torsemide 20 MG tablet Commonly known as:  DEMADEX Take 2 tablets (40 mg total) by mouth 2 (two) times daily.        Follow-up Information     Manus Rudd, MD. Call.   Specialty: General Surgery Why: We are working on your appointment,call to confirm, Arrive early to check in, fill out paperwork, Designer, fashion/clothing ID and Insurance account manager information: [address] [place] [address] [phone]         Grayce Sessions, NP Follow up.   Specialty: Internal Medicine Contact information: [address] [place] [address] [phone]                No Known Allergies  Consultations: General surgery   Procedures/Studies: [place] CHEST SOFT TISSUE Result Date: 09/14/2023 CLINICAL DATA:  Suspected cutaneous abscess, midline upper back between the shoulder blades. EXAM: ULTRASOUND OF CHEST SOFT TISSUES TECHNIQUE: Ultrasound examination of the chest wall soft tissues was performed in the area of clinical concern. COMPARISON:  None Available. FINDINGS: The technologist describes an indurated, red painful area in the midline upper back between the shoulder blades but did not give measurements. Multiplanar images of the area of concern were obtained using a high-frequency linear transducer. No underlying fluid collection or hyperemic mass is seen. IMPRESSION: No fluid collection or hyperemic mass is seen in the area of concern. Electronically Signed   By: Almira Bar M.D.   On: 09/14/2023 23:58   DG Chest 2 View Result Date: 09/13/2023 CLINICAL DATA:  Left shoulder  abscess. EXAM: CHEST - 2 VIEW COMPARISON:  X-ray 09/09/2023.  Older exams as well FINDINGS: Under penetrated underinflated x-ray. Enlarged cardiopericardial silhouette with vascular congestion. No pneumothorax, effusion or consolidation. Mild peribronchial thickening IMPRESSION: Underinflation.  Vascular congestion with peribronchial thickening. Electronically Signed   By: Karen Kays M.D.   On: 09/13/2023 15:48    DG Chest 2 View Result Date: 09/09/2023 CLINICAL DATA:  Left-sided chest pain for 24 hours. Shortness of breath. EXAM: CHEST - 2 VIEW COMPARISON:  06/01/2023 FINDINGS: Mild cardiac enlargement. Pulmonary vascular congestion. No pleural fluid, interstitial edema or airspace consolidation. Review of the visualized osseous structures are unremarkable. IMPRESSION: Cardiac enlargement and pulmonary vascular congestion. Electronically Signed   By: Signa Kell M.D.   On: 09/09/2023 16:48     Subjective: No acute issues or events overnight   Discharge Exam: Vitals:   09/18/23 0721 09/18/23 1500  BP: (!) 151/92 (!) 150/95  Pulse: 93 77  Resp: 18 18  Temp: 98 F (36.7 C) 98.5 F (36.9 C)  SpO2: 100%    Vitals:   09/18/23 0600 09/18/23 0617 09/18/23 0721 09/18/23 1500  BP: (!) 149/98 (!) 149/98 (!) 151/92 (!) 150/95  Pulse: 90 78 93 77  Resp:   18 18  Temp: 97.8 F (36.6 C) 97.8 F (36.6 C) 98 F (36.7 C) 98.5 F (36.9 C)  TempSrc: Oral Oral  Oral  SpO2: 98%  100%   Weight:      Height:        General:  Pleasantly resting in bed, No acute distress. HEENT:  Normocephalic atraumatic.  Sclerae nonicteric, noninjected.  Extraocular movements intact bilaterally. Neck:  Without mass or deformity.  Trachea is midline. Lungs:  Clear to auscultate bilaterally without rhonchi, wheeze, or rales. Heart:  Regular rate and rhythm.  Without murmurs, rubs, or gallops. Abdomen:  Soft, nontender, nondistended.  Without guarding or rebound. Extremities: Without cyanosis, clubbing, edema, or obvious deformity. Skin: Upper posterior thoracic bandage clean dry intact noted medial to L scapula - does not cross the midline    The results of significant diagnostics from this hospitalization (including imaging, microbiology, ancillary and laboratory) are listed below for reference.     Microbiology: Recent Results (from the past 240 hours)  Blood culture (routine x 2)     Status: None    Collection Time: 09/13/23  2:01 PM   Specimen: BLOOD  Result Value Ref Range Status   Specimen Description BLOOD RIGHT ANTECUBITAL  Final   Special Requests   Final    BOTTLES DRAWN AEROBIC ONLY Blood Culture results may not be optimal due to an inadequate volume of blood received in culture bottles   Culture   Final    NO GROWTH 5 DAYS Performed at [facility], [address] [place], [place] [address]    Report Status 09/18/2023 FINAL  Final  Blood culture (routine x 2)     Status: None (Preliminary result)   Collection Time: 09/13/23 11:42 PM   Specimen: BLOOD RIGHT ARM  Result Value Ref Range Status   Specimen Description BLOOD RIGHT ARM  Final   Special Requests   Final    BOTTLES DRAWN AEROBIC AND ANAEROBIC Blood Culture adequate volume   Culture   Final    NO GROWTH 4 DAYS Performed at [facility], [address] [place], [place] [address]    Report Status PENDING  Incomplete  MRSA Next Gen by PCR, Nasal     Status:  None   Collection Time: 09/14/23  6:52 AM   Specimen: Nasal Mucosa; Nasal Swab  Result Value Ref Range Status   MRSA by PCR Next Gen NOT DETECTED NOT DETECTED Final    Comment: (NOTE) The GeneXpert MRSA Assay (FDA approved for NASAL specimens only), is one component of a comprehensive MRSA colonization surveillance program. It is not intended to diagnose MRSA infection nor to guide or monitor treatment for MRSA infections. Test performance is not FDA approved in patients less than 15 years old. Performed at [facility], [address] [place], [place] [address]   Aerobic/Anaerobic Culture w Gram Stain (surgical/deep wound)     Status: None (Preliminary result)   Collection Time: 09/15/23  9:50 AM   Specimen: Path fluid; Body Fluid  Result Value Ref Range Status   Specimen Description WOUND  Final   Special Requests BACK ABSCESS  Final   Gram Stain   Final    ABUNDANT WBC PRESENT, PREDOMINANTLY PMN MODERATE GRAM POSITIVE COCCI IN  CLUSTERS Performed at [facility], [address], [place], [place] [address]    Culture   Final    MODERATE METHICILLIN RESISTANT STAPHYLOCOCCUS AUREUS NO ANAEROBES ISOLATED; CULTURE IN PROGRESS FOR 5 DAYS    Report Status PENDING  Incomplete   Organism ID, Bacteria METHICILLIN RESISTANT STAPHYLOCOCCUS AUREUS  Final      Susceptibility   Methicillin resistant staphylococcus aureus - MIC*    CIPROFLOXACIN <=0.5 SENSITIVE Sensitive     ERYTHROMYCIN <=0.25 SENSITIVE Sensitive     GENTAMICIN <=0.5 SENSITIVE Sensitive     OXACILLIN >=4 RESISTANT Resistant     TETRACYCLINE <=1 SENSITIVE Sensitive     VANCOMYCIN <=0.5 SENSITIVE Sensitive     TRIMETH/SULFA <=10 SENSITIVE Sensitive     CLINDAMYCIN <=0.25 SENSITIVE Sensitive     RIFAMPIN <=0.5 SENSITIVE Sensitive     Inducible Clindamycin NEGATIVE Sensitive     LINEZOLID 2 SENSITIVE Sensitive     * MODERATE METHICILLIN RESISTANT STAPHYLOCOCCUS AUREUS     Labs: BNP (last 3 results) Recent Labs    03/13/23 1630 06/01/23 1814 09/09/23 1830  BNP 293.2* 175.3* 171.0*   Basic Metabolic Panel: Recent Labs  Lab 09/14/23 0506 09/15/23 0638 09/15/23 2212 09/16/23 0525 09/17/23 0655 09/17/23 0915  NA 133* 136  --  133* 136 136  K 3.9 4.4  --  4.4 4.1 4.3  CL 102 107  --  102 106 106  CO2 22 22  --  20* 22 20*  GLUCOSE 277* 243* 478* 395* 219* 256*  BUN 31* 37*  --  43* 42* 43*  CREATININE 2.10* 2.45*  --  2.76* 2.46* 2.28*  CALCIUM 8.4* 8.7*  --  8.7* 8.8* 8.7*  MG 2.0  --   --   --   --   --    Liver Function Tests: Recent Labs  Lab 09/13/23 1424 09/14/23 0506 09/15/23 0638  AST 15 14* 11*  ALT 22 19 16   ALKPHOS 171* 138* 146*  BILITOT 0.5 0.6 0.6  PROT 6.9 5.8* 5.7*  ALBUMIN 2.4* 2.0* 2.1*   No results for input(s): "LIPASE", "AMYLASE" in the last 168 hours. No results for input(s): "AMMONIA" in the last 168 hours. CBC: Recent Labs  Lab 09/13/23 1424 09/14/23 0506 09/15/23 0638 09/16/23 0525  09/17/23 0915  WBC 14.5* 12.5* 13.2* 12.8* 9.6  NEUTROABS 10.8* 8.8*  --   --   --   HGB 11.9* 10.3* 11.2* 11.7* 11.2*  HCT 36.3*  30.2* 33.5* 34.1* 33.8*  MCV 89.4 88.0 87.7 86.1 88.5  PLT 302 264 272 330 344   Cardiac Enzymes: No results for input(s): "CKTOTAL", "CKMB", "CKMBINDEX", "TROPONINI" in the last 168 hours. BNP: Invalid input(s): "POCBNP" CBG: Recent Labs  Lab 09/17/23 1110 09/17/23 1654 09/17/23 2157 09/18/23 0718 09/18/23 1126  GLUCAP 244* 170* 285* 186* 226*   Urinalysis    Component Value Date/Time   COLORURINE YELLOW 09/14/2023 0247   APPEARANCEUR HAZY (A) 09/14/2023 0247   LABSPEC 1.023 09/14/2023 0247   PHURINE 5.0 09/14/2023 0247   GLUCOSEU >=500 (A) 09/14/2023 0247   HGBUR SMALL (A) 09/14/2023 0247   BILIRUBINUR NEGATIVE 09/14/2023 0247   KETONESUR NEGATIVE 09/14/2023 0247   PROTEINUR >=300 (A) 09/14/2023 0247   UROBILINOGEN 0.2 08/07/2010 0020   NITRITE NEGATIVE 09/14/2023 0247   LEUKOCYTESUR NEGATIVE 09/14/2023 0247   Sepsis Labs Recent Labs  Lab 09/14/23 0506 09/15/23 0638 09/16/23 0525 09/17/23 0915  WBC 12.5* 13.2* 12.8* 9.6   Microbiology Recent Results (from the past 240 hours)  Blood culture (routine x 2)     Status: None   Collection Time: 09/13/23  2:01 PM   Specimen: BLOOD  Result Value Ref Range Status   Specimen Description BLOOD RIGHT ANTECUBITAL  Final   Special Requests   Final    BOTTLES DRAWN AEROBIC ONLY Blood Culture results may not be optimal due to an inadequate volume of blood received in culture bottles   Culture   Final    NO GROWTH 5 DAYS Performed at [facility], [address] [place], [place] [address]    Report Status 09/18/2023 FINAL  Final  Blood culture (routine x 2)     Status: None (Preliminary result)   Collection Time: 09/13/23 11:42 PM   Specimen: BLOOD RIGHT ARM  Result Value Ref Range Status   Specimen Description BLOOD RIGHT ARM  Final   Special Requests   Final    BOTTLES DRAWN  AEROBIC AND ANAEROBIC Blood Culture adequate volume   Culture   Final    NO GROWTH 4 DAYS Performed at [facility], [address] [place], [place] [address]    Report Status PENDING  Incomplete  MRSA Next Gen by PCR, Nasal     Status: None   Collection Time: 09/14/23  6:52 AM   Specimen: Nasal Mucosa; Nasal Swab  Result Value Ref Range Status   MRSA by PCR Next Gen NOT DETECTED NOT DETECTED Final    Comment: (NOTE) The GeneXpert MRSA Assay (FDA approved for NASAL specimens only), is one component of a comprehensive MRSA colonization surveillance program. It is not intended to diagnose MRSA infection nor to guide or monitor treatment for MRSA infections. Test performance is not FDA approved in patients less than 18 years old. Performed at [facility], [address] [place], [place] [address]   Aerobic/Anaerobic Culture w Gram Stain (surgical/deep wound)     Status: None (Preliminary result)   Collection Time: 09/15/23  9:50 AM   Specimen: Path fluid; Body Fluid  Result Value Ref Range Status   Specimen Description WOUND  Final   Special Requests BACK ABSCESS  Final   Gram Stain   Final    ABUNDANT WBC PRESENT, PREDOMINANTLY PMN MODERATE GRAM POSITIVE COCCI IN CLUSTERS Performed at [facility], [address], [place], [place] [address]    Culture   Final    MODERATE METHICILLIN RESISTANT STAPHYLOCOCCUS AUREUS NO ANAEROBES ISOLATED; CULTURE  IN PROGRESS FOR 5 DAYS    Report Status PENDING  Incomplete   Organism ID, Bacteria METHICILLIN RESISTANT STAPHYLOCOCCUS AUREUS  Final      Susceptibility   Methicillin resistant staphylococcus aureus - MIC*    CIPROFLOXACIN <=0.5 SENSITIVE Sensitive     ERYTHROMYCIN <=0.25 SENSITIVE Sensitive     GENTAMICIN <=0.5 SENSITIVE Sensitive     OXACILLIN >=4 RESISTANT Resistant     TETRACYCLINE <=1 SENSITIVE Sensitive     VANCOMYCIN <=0.5 SENSITIVE Sensitive     TRIMETH/SULFA <=10 SENSITIVE Sensitive     CLINDAMYCIN  <=0.25 SENSITIVE Sensitive     RIFAMPIN <=0.5 SENSITIVE Sensitive     Inducible Clindamycin NEGATIVE Sensitive     LINEZOLID 2 SENSITIVE Sensitive     * MODERATE METHICILLIN RESISTANT STAPHYLOCOCCUS AUREUS     Time coordinating discharge: Over 30 minutes  SIGNED:   Azucena Fallen, DO [facility] 09/18/2023, 3:16 PM Pager   If 7PM-7AM, please contact night-coverage [email or web address]

## 2023-09-18 NOTE — Anesthesia Postprocedure Evaluation (Signed)
Anesthesia Post Note  Patient: Leroy Simmons  Procedure(s) Performed: INCISION AND DRAINAGE OF BACK ABSCESS (Back)     Patient location during evaluation: PACU Anesthesia Type: General Level of consciousness: awake and alert Pain management: pain level controlled Vital Signs Assessment: post-procedure vital signs reviewed and stable Respiratory status: spontaneous breathing, nonlabored ventilation, respiratory function stable and patient connected to nasal cannula oxygen Cardiovascular status: blood pressure returned to baseline and stable Postop Assessment: no apparent nausea or vomiting Anesthetic complications: yes   Encounter Notable Events  Notable Event Outcome Phase Comment  Difficult to intubate - expected  Intraprocedure Filed from anesthesia note documentation.    Last Vitals:  Vitals:   09/18/23 0617 09/18/23 0721  BP: (!) 149/98 (!) 151/92  Pulse: 78 93  Resp:  18  Temp: 36.6 C 36.7 C  SpO2:  100%    Last Pain:  Vitals:   09/18/23 0745  TempSrc:   PainSc: 0-No pain                 Detravion Tester S

## 2023-09-19 ENCOUNTER — Telehealth: Payer: Self-pay

## 2023-09-19 LAB — CULTURE, BLOOD (ROUTINE X 2)
Culture: NO GROWTH
Special Requests: ADEQUATE

## 2023-09-19 NOTE — Transitions of Care (Post Inpatient/ED Visit) (Signed)
   09/19/2023  Name: Leroy Simmons MRN: [id number] DOB: 27-Feb-1971  Today's TOC FU Call Status: Today's TOC FU Call Status:: Unsuccessful Call (1st Attempt) Unsuccessful Call (1st Attempt) Date: 09/19/23  Attempted to reach the patient regarding the most recent Inpatient/ED visit.  Follow Up Plan: Additional outreach attempts will be made to reach the patient to complete the Transitions of Care (Post Inpatient/ED visit) call.   Lonia Chimera, RN, BSN, CEN [employer]- Transition of Care Team.  [facility] [phone]

## 2023-09-20 ENCOUNTER — Telehealth: Payer: Self-pay

## 2023-09-20 ENCOUNTER — Other Ambulatory Visit (HOSPITAL_COMMUNITY): Payer: Self-pay

## 2023-09-20 LAB — AEROBIC/ANAEROBIC CULTURE W GRAM STAIN (SURGICAL/DEEP WOUND)

## 2023-09-20 NOTE — Transitions of Care (Post Inpatient/ED Visit) (Signed)
   09/20/2023  Name: TIELER MARKHAM MRN: [id number] DOB: 1970-10-08  Today's TOC FU Call Status: Today's TOC FU Call Status:: Unsuccessful Call (2nd Attempt) Unsuccessful Call (2nd Attempt) Date: 09/20/23  Attempted to reach the patient regarding the most recent Inpatient/ED visit.  Follow Up Plan: Additional outreach attempts will be made to reach the patient to complete the Transitions of Care (Post Inpatient/ED visit) call.   Lonia Chimera, RN, BSN, CEN [employer]- Transition of Care Team.  [facility] [phone]

## 2023-09-21 ENCOUNTER — Telehealth: Payer: Self-pay

## 2023-09-21 NOTE — Transitions of Care (Post Inpatient/ED Visit) (Signed)
   09/21/2023  Name: Leroy Simmons MRN: [id number] DOB: January 03, 1971  Today's TOC FU Call Status: Today's TOC FU Call Status:: Unsuccessful Call (3rd Attempt) Unsuccessful Call (3rd Attempt) Date: 09/21/23  Attempted to reach the patient regarding the most recent Inpatient/ED visit.  Follow Up Plan: No further outreach attempts will be made at this time. We have been unable to contact the patient.  Lonia Chimera, RN, BSN, CEN [employer]- Transition of Care Team.  [facility] [phone]

## 2023-10-02 ENCOUNTER — Other Ambulatory Visit (HOSPITAL_COMMUNITY): Payer: Self-pay

## 2023-10-02 DIAGNOSIS — L02212 Cutaneous abscess of back [any part, except buttock]: Secondary | ICD-10-CM | POA: Diagnosis not present

## 2023-10-03 ENCOUNTER — Other Ambulatory Visit (HOSPITAL_COMMUNITY): Payer: Self-pay

## 2023-10-17 ENCOUNTER — Other Ambulatory Visit: Payer: 59

## 2023-11-01 ENCOUNTER — Telehealth: Payer: Self-pay

## 2023-11-01 NOTE — Telephone Encounter (Signed)
Pt is scheduled for Dr. Delton Coombes 11/02/23 @8 :45am for a f/u for his ct and pft results. Neither one has been scheduled. Routing to [facility] to schedule Ct and routing to front office to reschedule pts appt tmr and schedule him a full pft 1 hour prior to his new upcoming apt. Pt must have Ct and pft done before he is seen.

## 2023-11-02 ENCOUNTER — Ambulatory Visit: Payer: 59 | Admitting: Emergency Medicine

## 2023-11-02 NOTE — Telephone Encounter (Signed)
NFN

## 2023-11-27 ENCOUNTER — Telehealth (INDEPENDENT_AMBULATORY_CARE_PROVIDER_SITE_OTHER): Payer: Self-pay | Admitting: Primary Care

## 2023-11-27 ENCOUNTER — Other Ambulatory Visit (HOSPITAL_COMMUNITY): Payer: Self-pay

## 2023-11-27 ENCOUNTER — Other Ambulatory Visit (INDEPENDENT_AMBULATORY_CARE_PROVIDER_SITE_OTHER): Payer: Self-pay | Admitting: Primary Care

## 2023-11-27 DIAGNOSIS — E119 Type 2 diabetes mellitus without complications: Secondary | ICD-10-CM

## 2023-11-27 MED ORDER — INSULIN ASPART 100 UNIT/ML FLEXPEN
5.0000 [IU] | PEN_INJECTOR | Freq: Three times a day (TID) | SUBCUTANEOUS | 0 refills | Status: DC
  Filled 2023-11-27: qty 12, 80d supply, fill #0

## 2023-11-27 NOTE — Telephone Encounter (Signed)
Will forward to provider and clinical pharmacist  

## 2023-11-27 NOTE — Telephone Encounter (Unsigned)
 Copied from CRM [id number]. Topic: Clinical - Prescription Issue >> Nov 27, 2023  2:58 PM Kristie Cowman wrote: Reason for CRM: Reason for CRM:  Abmelog (?? Spelling) insulin pen - insurance is not covering this pen.  Patient wants to know if there is something else he can be prescribed by his physician that will be covered by his insurance.   Patient can be reached at - [phone]

## 2023-11-27 NOTE — Telephone Encounter (Signed)
 Copied from CRM [id number]. Topic: Clinical - Prescription Issue >> Nov 27, 2023  3:01 PM Kristie Cowman wrote: Reason for CRM: Reason for CRM:  Abmelog (?? Spelling) insulin pen - insurance is not covering this pen.  Patient wants to know if there is something else he can be prescribed by his physician that will be covered by his insurance.   Patient can be reached at - [phone]

## 2023-11-27 NOTE — Telephone Encounter (Signed)
 Copied from CRM [id number]. Topic: Clinical - Medication Refill >> Nov 27, 2023 12:38 PM Payton Doughty wrote: Most Recent Primary Care Visit:  Provider: Grayce Sessions  Department: [facility]  Visit Type: OFFICE VISIT  Date: 06/01/2023  Medication: Novalog  (preferred by insurance company)  Has the patient contacted their pharmacy? No Pt needs a new Rx for this preferred med.  Pt has changed insurance. Is this the correct pharmacy for this prescription? Yes If no, delete pharmacy and type the correct one.  This is the patient's preferred pharmacy:   [facility] [address] [place] [address] Phone: [phone] Fax: [phone]  Has the prescription been filled recently? Yes  Is the patient out of the medication? Yes  Has the patient been seen for an appointment in the last year OR does the patient have an upcoming appointment? Yes  Can we respond through MyChart? No  Makayla was working w/ pt this am, and states the pt's nsulin lispro (HUMALOG KWIKPEN) 100 UNIT/ML KwikPen is no longer covered by the pt's new insurance.  The preferred med is Novalog.  Pt needs new Rx Pt's new insurance updated in his chart

## 2024-01-09 ENCOUNTER — Emergency Department (HOSPITAL_COMMUNITY)

## 2024-01-09 ENCOUNTER — Encounter (HOSPITAL_COMMUNITY): Payer: Self-pay | Admitting: Emergency Medicine

## 2024-01-09 ENCOUNTER — Emergency Department (HOSPITAL_BASED_OUTPATIENT_CLINIC_OR_DEPARTMENT_OTHER)

## 2024-01-09 ENCOUNTER — Other Ambulatory Visit: Payer: Self-pay

## 2024-01-09 ENCOUNTER — Observation Stay (HOSPITAL_COMMUNITY)

## 2024-01-09 ENCOUNTER — Inpatient Hospital Stay (HOSPITAL_COMMUNITY)
Admission: EM | Admit: 2024-01-09 | Discharge: 2024-01-19 | DRG: 175 | Disposition: A | Discharge status: Home Health | Attending: Student | Admitting: Student

## 2024-01-09 DIAGNOSIS — Z9049 Acquired absence of other specified parts of digestive tract: Secondary | ICD-10-CM

## 2024-01-09 DIAGNOSIS — D638 Anemia in other chronic diseases classified elsewhere: Secondary | ICD-10-CM | POA: Diagnosis present

## 2024-01-09 DIAGNOSIS — E66812 Obesity, class 2: Secondary | ICD-10-CM | POA: Diagnosis present

## 2024-01-09 DIAGNOSIS — A084 Viral intestinal infection, unspecified: Secondary | ICD-10-CM | POA: Diagnosis present

## 2024-01-09 DIAGNOSIS — R079 Chest pain, unspecified: Secondary | ICD-10-CM | POA: Diagnosis not present

## 2024-01-09 DIAGNOSIS — Z794 Long term (current) use of insulin: Secondary | ICD-10-CM

## 2024-01-09 DIAGNOSIS — E119 Type 2 diabetes mellitus without complications: Secondary | ICD-10-CM

## 2024-01-09 DIAGNOSIS — B001 Herpesviral vesicular dermatitis: Secondary | ICD-10-CM | POA: Diagnosis present

## 2024-01-09 DIAGNOSIS — Z86718 Personal history of other venous thrombosis and embolism: Secondary | ICD-10-CM

## 2024-01-09 DIAGNOSIS — Z1152 Encounter for screening for COVID-19: Secondary | ICD-10-CM

## 2024-01-09 DIAGNOSIS — E8809 Other disorders of plasma-protein metabolism, not elsewhere classified: Secondary | ICD-10-CM | POA: Diagnosis not present

## 2024-01-09 DIAGNOSIS — E876 Hypokalemia: Secondary | ICD-10-CM | POA: Diagnosis present

## 2024-01-09 DIAGNOSIS — F32A Depression, unspecified: Secondary | ICD-10-CM | POA: Diagnosis present

## 2024-01-09 DIAGNOSIS — I82452 Acute embolism and thrombosis of left peroneal vein: Secondary | ICD-10-CM | POA: Diagnosis present

## 2024-01-09 DIAGNOSIS — E1122 Type 2 diabetes mellitus with diabetic chronic kidney disease: Secondary | ICD-10-CM | POA: Diagnosis present

## 2024-01-09 DIAGNOSIS — I2699 Other pulmonary embolism without acute cor pulmonale: Secondary | ICD-10-CM | POA: Diagnosis not present

## 2024-01-09 DIAGNOSIS — E872 Acidosis, unspecified: Secondary | ICD-10-CM | POA: Diagnosis not present

## 2024-01-09 DIAGNOSIS — N184 Chronic kidney disease, stage 4 (severe): Secondary | ICD-10-CM | POA: Diagnosis present

## 2024-01-09 DIAGNOSIS — E1165 Type 2 diabetes mellitus with hyperglycemia: Secondary | ICD-10-CM

## 2024-01-09 DIAGNOSIS — N189 Chronic kidney disease, unspecified: Secondary | ICD-10-CM | POA: Diagnosis present

## 2024-01-09 DIAGNOSIS — N179 Acute kidney failure, unspecified: Secondary | ICD-10-CM | POA: Diagnosis present

## 2024-01-09 DIAGNOSIS — I130 Hypertensive heart and chronic kidney disease with heart failure and stage 1 through stage 4 chronic kidney disease, or unspecified chronic kidney disease: Secondary | ICD-10-CM | POA: Diagnosis present

## 2024-01-09 DIAGNOSIS — Z6836 Body mass index (BMI) 36.0-36.9, adult: Secondary | ICD-10-CM

## 2024-01-09 DIAGNOSIS — N1832 Chronic kidney disease, stage 3b: Secondary | ICD-10-CM | POA: Diagnosis present

## 2024-01-09 DIAGNOSIS — M7989 Other specified soft tissue disorders: Secondary | ICD-10-CM

## 2024-01-09 DIAGNOSIS — I2510 Atherosclerotic heart disease of native coronary artery without angina pectoris: Secondary | ICD-10-CM | POA: Diagnosis present

## 2024-01-09 DIAGNOSIS — E669 Obesity, unspecified: Secondary | ICD-10-CM | POA: Diagnosis present

## 2024-01-09 DIAGNOSIS — J9601 Acute respiratory failure with hypoxia: Principal | ICD-10-CM

## 2024-01-09 DIAGNOSIS — I252 Old myocardial infarction: Secondary | ICD-10-CM

## 2024-01-09 DIAGNOSIS — I214 Non-ST elevation (NSTEMI) myocardial infarction: Secondary | ICD-10-CM | POA: Diagnosis present

## 2024-01-09 DIAGNOSIS — T502X5A Adverse effect of carbonic-anhydrase inhibitors, benzothiadiazides and other diuretics, initial encounter: Secondary | ICD-10-CM | POA: Diagnosis not present

## 2024-01-09 DIAGNOSIS — E785 Hyperlipidemia, unspecified: Secondary | ICD-10-CM | POA: Diagnosis present

## 2024-01-09 DIAGNOSIS — Z7901 Long term (current) use of anticoagulants: Secondary | ICD-10-CM

## 2024-01-09 DIAGNOSIS — J189 Pneumonia, unspecified organism: Secondary | ICD-10-CM | POA: Diagnosis present

## 2024-01-09 DIAGNOSIS — I5033 Acute on chronic diastolic (congestive) heart failure: Secondary | ICD-10-CM | POA: Diagnosis present

## 2024-01-09 DIAGNOSIS — Z79899 Other long term (current) drug therapy: Secondary | ICD-10-CM

## 2024-01-09 DIAGNOSIS — F419 Anxiety disorder, unspecified: Secondary | ICD-10-CM | POA: Diagnosis present

## 2024-01-09 DIAGNOSIS — D631 Anemia in chronic kidney disease: Secondary | ICD-10-CM | POA: Diagnosis present

## 2024-01-09 DIAGNOSIS — Z8249 Family history of ischemic heart disease and other diseases of the circulatory system: Secondary | ICD-10-CM

## 2024-01-09 HISTORY — DX: Atherosclerotic heart disease of native coronary artery without angina pectoris: I25.10

## 2024-01-09 LAB — LIPASE, BLOOD: Lipase: 33 U/L (ref 11–51)

## 2024-01-09 LAB — COMPREHENSIVE METABOLIC PANEL WITH GFR
ALT: 27 U/L (ref 0–44)
AST: 36 U/L (ref 15–41)
Albumin: 2 g/dL — ABNORMAL LOW (ref 3.5–5.0)
Alkaline Phosphatase: 107 U/L (ref 38–126)
Anion gap: 11 (ref 5–15)
BUN: 35 mg/dL — ABNORMAL HIGH (ref 6–20)
CO2: 19 mmol/L — ABNORMAL LOW (ref 22–32)
Calcium: 8.2 mg/dL — ABNORMAL LOW (ref 8.9–10.3)
Chloride: 104 mmol/L (ref 98–111)
Creatinine, Ser: 2.62 mg/dL — ABNORMAL HIGH (ref 0.61–1.24)
GFR, Estimated: 29 mL/min — ABNORMAL LOW (ref >60)
Glucose, Bld: 199 mg/dL — ABNORMAL HIGH (ref 70–99)
Potassium: 4.1 mmol/L (ref 3.5–5.1)
Sodium: 134 mmol/L — ABNORMAL LOW (ref 135–145)
Total Bilirubin: 0.7 mg/dL (ref 0.0–1.2)
Total Protein: 5.9 g/dL — ABNORMAL LOW (ref 6.5–8.1)

## 2024-01-09 LAB — CBC WITH DIFFERENTIAL/PLATELET
Abs Immature Granulocytes: 0.03 10*3/uL (ref 0.00–0.07)
Basophils Absolute: 0 10*3/uL (ref 0.0–0.1)
Basophils Relative: 1 %
Eosinophils Absolute: 0 10*3/uL (ref 0.0–0.5)
Eosinophils Relative: 0 %
HCT: 42 % (ref 39.0–52.0)
Hemoglobin: 13.9 g/dL (ref 13.0–17.0)
Immature Granulocytes: 1 %
Lymphocytes Relative: 13 %
Lymphs Abs: 0.8 10*3/uL (ref 0.7–4.0)
MCH: 29.8 pg (ref 26.0–34.0)
MCHC: 33.1 g/dL (ref 30.0–36.0)
MCV: 89.9 fL (ref 80.0–100.0)
Monocytes Absolute: 0.8 10*3/uL (ref 0.1–1.0)
Monocytes Relative: 12 %
Neutro Abs: 4.9 10*3/uL (ref 1.7–7.7)
Neutrophils Relative %: 73 %
Platelets: 246 10*3/uL (ref 150–400)
RBC: 4.67 MIL/uL (ref 4.22–5.81)
RDW: 12.6 % (ref 11.5–15.5)
WBC: 6.6 10*3/uL (ref 4.0–10.5)
nRBC: 0 % (ref 0.0–0.2)

## 2024-01-09 LAB — URINALYSIS, W/ REFLEX TO CULTURE (INFECTION SUSPECTED)
Bilirubin Urine: NEGATIVE
Glucose, UA: 150 mg/dL — AB
Ketones, ur: NEGATIVE mg/dL
Leukocytes,Ua: NEGATIVE
Nitrite: NEGATIVE
Protein, ur: >=300 mg/dL — AB
Specific Gravity, Urine: 1.012 (ref 1.005–1.030)
pH: 5 (ref 5.0–8.0)

## 2024-01-09 LAB — RESP PANEL BY RT-PCR (RSV, FLU A&B, COVID)  RVPGX2
Influenza A by PCR: NEGATIVE
Influenza B by PCR: NEGATIVE
Resp Syncytial Virus by PCR: NEGATIVE
SARS Coronavirus 2 by RT PCR: NEGATIVE

## 2024-01-09 LAB — C DIFFICILE QUICK SCREEN W PCR REFLEX
C Diff antigen: NEGATIVE
C Diff interpretation: NOT DETECTED
C Diff toxin: NEGATIVE

## 2024-01-09 LAB — TROPONIN I (HIGH SENSITIVITY)
Troponin I (High Sensitivity): 164 ng/L (ref <18)
Troponin I (High Sensitivity): 174 ng/L (ref <18)
Troponin I (High Sensitivity): 151 ng/L (ref <18)
Troponin I (High Sensitivity): 168 ng/L (ref <18)

## 2024-01-09 LAB — I-STAT CG4 LACTIC ACID, ED: Lactic Acid, Venous: 1.2 mmol/L (ref 0.5–1.9)

## 2024-01-09 LAB — D-DIMER, QUANTITATIVE: D-Dimer, Quant: 2.05 ug{FEU}/mL — ABNORMAL HIGH (ref 0.00–0.50)

## 2024-01-09 LAB — ETHANOL: Alcohol, Ethyl (B): <10 mg/dL (ref <10)

## 2024-01-09 LAB — HEPARIN LEVEL (UNFRACTIONATED): Heparin Unfractionated: 0.52 [IU]/mL (ref 0.30–0.70)

## 2024-01-09 LAB — PROCALCITONIN: Procalcitonin: 0.16 ng/mL

## 2024-01-09 LAB — BRAIN NATRIURETIC PEPTIDE: B Natriuretic Peptide: 230.6 pg/mL — ABNORMAL HIGH (ref 0.0–100.0)

## 2024-01-09 MED ORDER — TECHNETIUM TO 99M ALBUMIN AGGREGATED
4.2000 | Freq: Once | INTRAVENOUS | Status: AC | PRN
  Administered 2024-01-09: 4.2 via INTRAVENOUS

## 2024-01-09 MED ORDER — LACTATED RINGERS IV BOLUS
1000.0000 mL | Freq: Once | INTRAVENOUS | Status: AC
  Administered 2024-01-09: 1000 mL via INTRAVENOUS

## 2024-01-09 MED ORDER — VANCOMYCIN HCL 1750 MG/350ML IV SOLN
1750.0000 mg | INTRAVENOUS | Status: DC
  Filled 2024-01-09: qty 350

## 2024-01-09 MED ORDER — LACTATED RINGERS IV SOLN: Freq: Once | INTRAVENOUS | Status: DC

## 2024-01-09 MED ORDER — PIPERACILLIN-TAZOBACTAM 3.375 G IVPB
3.3750 g | Freq: Three times a day (TID) | INTRAVENOUS | Status: DC
  Administered 2024-01-10 – 2024-01-11 (×5): 3.375 g via INTRAVENOUS
  Filled 2024-01-09 (×5): qty 50

## 2024-01-09 MED ORDER — SODIUM CHLORIDE 0.9 % IV SOLN
500.0000 mg | Freq: Once | INTRAVENOUS | Status: AC
  Administered 2024-01-09: 500 mg via INTRAVENOUS
  Filled 2024-01-09: qty 5

## 2024-01-09 MED ORDER — SODIUM CHLORIDE 0.9% FLUSH
3.0000 mL | Freq: Two times a day (BID) | INTRAVENOUS | Status: DC
  Administered 2024-01-09 – 2024-01-19 (×18): 3 mL via INTRAVENOUS

## 2024-01-09 MED ORDER — MORPHINE SULFATE (PF) 2 MG/ML IV SOLN
2.0000 mg | INTRAVENOUS | Status: DC | PRN
  Administered 2024-01-10: 2 mg via INTRAVENOUS
  Filled 2024-01-09: qty 1

## 2024-01-09 MED ORDER — SODIUM CHLORIDE 0.9 % IV SOLN
1.0000 g | Freq: Once | INTRAVENOUS | Status: AC
  Administered 2024-01-09: 1 g via INTRAVENOUS
  Filled 2024-01-09: qty 10

## 2024-01-09 MED ORDER — SUCRALFATE 1 GM/10ML PO SUSP
1.0000 g | Freq: Three times a day (TID) | ORAL | Status: DC
  Administered 2024-01-09 – 2024-01-19 (×39): 1 g via ORAL
  Filled 2024-01-09 (×39): qty 10

## 2024-01-09 MED ORDER — FUROSEMIDE 10 MG/ML IJ SOLN
20.0000 mg | Freq: Two times a day (BID) | INTRAMUSCULAR | Status: DC
  Administered 2024-01-09 – 2024-01-10 (×2): 20 mg via INTRAVENOUS
  Filled 2024-01-09 (×2): qty 2

## 2024-01-09 MED ORDER — ACETAMINOPHEN 500 MG PO TABS
1000.0000 mg | ORAL_TABLET | Freq: Once | ORAL | Status: AC
  Administered 2024-01-09: 1000 mg via ORAL
  Filled 2024-01-09: qty 2

## 2024-01-09 MED ORDER — ALBUTEROL SULFATE (2.5 MG/3ML) 0.083% IN NEBU
3.0000 mL | INHALATION_SOLUTION | Freq: Four times a day (QID) | RESPIRATORY_TRACT | Status: DC | PRN
  Administered 2024-01-09 – 2024-01-11 (×2): 3 mL via RESPIRATORY_TRACT
  Filled 2024-01-09 (×2): qty 3

## 2024-01-09 MED ORDER — SODIUM CHLORIDE 0.9% FLUSH
3.0000 mL | Freq: Two times a day (BID) | INTRAVENOUS | Status: DC
  Administered 2024-01-09 – 2024-01-10 (×3): 3 mL via INTRAVENOUS
  Administered 2024-01-11: 10 mL via INTRAVENOUS

## 2024-01-09 MED ORDER — ASPIRIN 81 MG PO CHEW
324.0000 mg | CHEWABLE_TABLET | Freq: Once | ORAL | Status: AC
  Administered 2024-01-09: 324 mg via ORAL
  Filled 2024-01-09: qty 4

## 2024-01-09 MED ORDER — SODIUM CHLORIDE 0.9% FLUSH
3.0000 mL | INTRAVENOUS | Status: DC | PRN
  Administered 2024-01-15: 10 mL via INTRAVENOUS

## 2024-01-09 MED ORDER — VANCOMYCIN HCL 2000 MG/400ML IV SOLN
2000.0000 mg | Freq: Once | INTRAVENOUS | Status: AC
  Administered 2024-01-10: 2000 mg via INTRAVENOUS
  Filled 2024-01-09: qty 400

## 2024-01-09 MED ORDER — HEPARIN BOLUS VIA INFUSION
5500.0000 [IU] | Freq: Once | INTRAVENOUS | Status: AC
  Administered 2024-01-09: 5500 [IU] via INTRAVENOUS
  Filled 2024-01-09: qty 5500

## 2024-01-09 MED ORDER — ONDANSETRON HCL 4 MG/2ML IJ SOLN
4.0000 mg | Freq: Four times a day (QID) | INTRAMUSCULAR | Status: DC | PRN
  Administered 2024-01-10 – 2024-01-15 (×6): 4 mg via INTRAVENOUS
  Filled 2024-01-09 (×5): qty 2

## 2024-01-09 MED ORDER — AMLODIPINE BESYLATE 5 MG PO TABS
10.0000 mg | ORAL_TABLET | Freq: Every day | ORAL | Status: DC
  Administered 2024-01-09: 10 mg via ORAL
  Filled 2024-01-09: qty 2

## 2024-01-09 MED ORDER — HEPARIN (PORCINE) 25000 UT/250ML-% IV SOLN
1650.0000 [IU]/h | INTRAVENOUS | Status: AC
  Administered 2024-01-09 – 2024-01-10 (×2): 1600 [IU]/h via INTRAVENOUS
  Administered 2024-01-11 – 2024-01-12 (×2): 1650 [IU]/h via INTRAVENOUS
  Filled 2024-01-09 (×5): qty 250

## 2024-01-09 MED ORDER — METOCLOPRAMIDE HCL 5 MG/ML IJ SOLN
5.0000 mg | Freq: Once | INTRAMUSCULAR | Status: AC
  Administered 2024-01-09: 5 mg via INTRAVENOUS
  Filled 2024-01-09: qty 2

## 2024-01-09 MED ORDER — THIAMINE HCL 100 MG/ML IJ SOLN
100.0000 mg | Freq: Every day | INTRAMUSCULAR | Status: DC
  Administered 2024-01-09 – 2024-01-13 (×5): 100 mg via INTRAVENOUS
  Filled 2024-01-09 (×5): qty 2

## 2024-01-09 MED ORDER — HYDRALAZINE HCL 20 MG/ML IJ SOLN
5.0000 mg | INTRAMUSCULAR | Status: DC | PRN
  Administered 2024-01-09: 5 mg via INTRAVENOUS
  Filled 2024-01-09: qty 1

## 2024-01-09 MED ORDER — PANTOPRAZOLE SODIUM 40 MG IV SOLR
40.0000 mg | Freq: Two times a day (BID) | INTRAVENOUS | Status: DC
  Administered 2024-01-09 – 2024-01-13 (×8): 40 mg via INTRAVENOUS
  Filled 2024-01-09 (×8): qty 10

## 2024-01-09 NOTE — ED Notes (Signed)
 MD notified of patient current vital signs. See new orders.

## 2024-01-09 NOTE — H&P (Signed)
 Ct chest showing multifocal pneumonia Antibiotics continued.

## 2024-01-09 NOTE — Progress Notes (Signed)
 Lower extremity venous duplex completed. Please see CV Procedures for preliminary results.  Shona Simpson, RVT 01/09/24 2:39 PM

## 2024-01-09 NOTE — ED Notes (Signed)
 Patient encouraged to urinate for sample collection by RN. Patient provided cup to give sample and will alert nurse when he has to urinate.

## 2024-01-09 NOTE — ED Triage Notes (Signed)
 Patient coming to ED for evaluation of headache, chest discomfort, nausea, vomiting, cough, and nasal congestion.  Reports "I have an infection of some type somewhere."  Symptoms started on Saturday.  Unknown if he had a fever.  States he has had chills.

## 2024-01-09 NOTE — ED Notes (Signed)
 Lab at bedside

## 2024-01-09 NOTE — ED Notes (Signed)
Vasc at bedside.

## 2024-01-09 NOTE — ED Notes (Signed)
 Patient placed on 2L [place] by MD Criss Alvine. Upon MD Goldston's arrival to room, patient found to be on 86% room air.

## 2024-01-09 NOTE — ED Notes (Signed)
 Patient transported to CT

## 2024-01-09 NOTE — Progress Notes (Addendum)
 PHARMACY - ANTICOAGULATION CONSULT NOTE  Pharmacy Consult for heparin Indication: pulmonary embolus  No Known Allergies  Patient Measurements: Height: 5\' 8"  (172.7 cm) Weight: 114.8 kg (253 lb) IBW/kg (Calculated) : 68.4 HEPARIN DW (KG): 94.3  Vital Signs: Temp: 99.2 F (37.3 C) (04/08 1138) Temp Source: Oral (04/08 1138) BP: 136/73 (04/08 0950) Pulse Rate: 88 (04/08 1200)  Labs: Recent Labs    01/09/24 0534 01/09/24 0948 01/09/24 1315  HGB 13.9  --   --   HCT 42.0  --   --   PLT 246  --   --   CREATININE 2.62*  --   --   TROPONINIHS 164* 174* 151*    Estimated Creatinine Clearance: 41.5 mL/min (A) (by C-G formula based on SCr of 2.62 mg/dL (H)).   Medical History: Past Medical History:  Diagnosis Date   Acute gangrenous appendicitis 09/25/2022   Anxiety    CHF (congestive heart failure) (HCC)    Coronary artery disease    Depression    Diabetes mellitus without complication (HCC)    Hypertension      Assessment: 52 YOM presenting with HA/ cough and SOB, VQ scan positive for large segmental perfusion defect, he is not on anticoagulation PTA, CBC wnl.  Goal of Therapy:  Heparin level 0.3-0.7 units/ml Monitor platelets by anticoagulation protocol: Yes   Plan:  Heparin 5500 units IV x 1, and gtt at 1600 units/hr F/u 6 hour heparin level F/u long term AC plan  Addendum: Initial heparin level therapeutic on 1600 units/hr so will continue at this rate and f/u with AM labs  Daylene Posey, PharmD, [facility] Clinical Pharmacist ED Pharmacist Phone # [phone] 01/09/2024 2:53 PM

## 2024-01-09 NOTE — Progress Notes (Signed)
 Pharmacy Antibiotic Note  Leroy Simmons is a 53 y.o. male admitted on 01/09/2024 with pneumonia.  Pharmacy has been consulted for Vancomycin/Zosyn dosing. Likely multi-focal PNA per CT. Also found to have PE on VQ scan. WBC WNL. CKD close to baseline.   Plan: Vancomycin 2000 mg IV x 1, then 1750 mg IV q48h >>>Estimated AUC: 483 Zosyn 3.375G IV q8h to be infused over 4 hours Trend WBC, temp, renal function  F/U infectious work-up Drug levels as indicated   Height: 5\' 8"  (172.7 cm) Weight: 114.8 kg (253 lb) IBW/kg (Calculated) : 68.4  Temp (24hrs), Avg:99.8 F (37.7 C), Min:99 F (37.2 C), Max:100.7 F (38.2 C)  Recent Labs  Lab 01/09/24 0532 01/09/24 0534  WBC  --  6.6  CREATININE  --  2.62*  LATICACIDVEN 1.2  --     Estimated Creatinine Clearance: 41.5 mL/min (A) (by C-G formula based on SCr of 2.62 mg/dL (H)).    No Known Allergies  Abran Duke, PharmD, BCPS Clinical Pharmacist Phone: [phone]

## 2024-01-09 NOTE — H&P (Signed)
 History and Physical    Patient: Leroy Simmons DOB: 1971-05-20 DOA: 01/09/2024 DOS: the patient was seen and examined on 01/09/2024 PCP: Grayce Sessions, NP  Patient coming from: Home Chief complaint: Chief Complaint  Patient presents with   Headache   Chills   HPI:  Leroy Simmons is a 53 y.o. male with past medical history  of  hypertension, CHF, diabetes mellitus type 2, CKD stage III, anxiety, depression, history of DVT not on anticoagulation, and recurrent boils comes today with headaches chills cough congestion nausea vomiting diarrhea. Patient was last admitted in December 2024 for MRSA positive cellulitis.  A1c noted at that time was 10.8.  Home med list shows amlodipine, albuterol, lisinopril, torsemide.  Patient's symptoms reported to started on Saturday.  Patient also reported chest pain.  ED Course: Pt in ed at bedside  is alert awake oriented febrile, O2 sats 86% currently 97% on 2 L nasal cannula Vital signs in the ED were notable for the following:  Vitals:   01/09/24 1126 01/09/24 1130 01/09/24 1138 01/09/24 1200  BP:      Pulse:  86  88  Temp:   99.2 F (37.3 C)   Resp:    16  Height:      Weight:      SpO2: (!) 86% 97%  97%  TempSrc:   Oral   BMI (Calculated):       >>ED evaluation thus far shows: EKG today shows sinus tach at 102 PR of 130 QTc of 427 ,ST elevation in leads V1, V2 and V3 similar to previous EKG. initial troponin of 160 4 repeat 170 4 repeat pending.  BNP ordered and pending CMP shows sodium 134 bicarb of 19 anion gap of 11 glucose 199 BUN 35 creatinine 2.6 normal LFTs. CBC shows white count of 6.6 hemoglobin of 13.9 platelets of 246. Viral panel today negative for flu RSV and COVID. Procalcitonin ordered and pending. VQ scan pending lower extremity venous Dopplers pending. Blood cultures ordered in the ED. Admission request for respiratory failure and pneumonia.  >>While in the ED patient received the  following: Medications  cefTRIAXone (ROCEPHIN) 1 g in sodium chloride 0.9 % 100 mL IVPB (1 g Intravenous New Bag/Given 01/09/24 1200)  azithromycin (ZITHROMAX) 500 mg in sodium chloride 0.9 % 250 mL IVPB (has no administration in time range)  acetaminophen (TYLENOL) tablet 1,000 mg (1,000 mg Oral Given 01/09/24 0819)  lactated ringers bolus 1,000 mL (0 mLs Intravenous Stopped 01/09/24 1156)  metoCLOPramide (REGLAN) injection 5 mg (5 mg Intravenous Given 01/09/24 0941)  aspirin chewable tablet 324 mg (324 mg Oral Given 01/09/24 0941)   Review of Systems  Constitutional:  Positive for chills and malaise/fatigue.  HENT:  Positive for congestion.   Respiratory:  Positive for cough and shortness of breath.   Cardiovascular:  Positive for chest pain.  Gastrointestinal:  Positive for diarrhea, nausea and vomiting.   Past Medical History:  Diagnosis Date   Acute gangrenous appendicitis 09/25/2022   Anxiety    CHF (congestive heart failure) (HCC)    Coronary artery disease    Depression    Diabetes mellitus without complication (HCC)    Hypertension    Past Surgical History:  Procedure Laterality Date   INCISION AND DRAINAGE ABSCESS N/A 09/15/2023   Procedure: INCISION AND DRAINAGE OF BACK ABSCESS;  Surgeon: Manus Rudd, MD;  Location: [facility] OR;  Service: General;  Laterality: N/A;   LAPAROSCOPIC APPENDECTOMY N/A 09/26/2022   Procedure: APPENDECTOMY  LAPAROSCOPIC;  Surgeon: Campbell Lerner, MD;  Location: [facility] ORS;  Service: General;  Laterality: N/A;   LEFT HEART CATH AND CORONARY ANGIOGRAPHY N/A 03/21/2022   Procedure: LEFT HEART CATH AND CORONARY ANGIOGRAPHY;  Surgeon: Corky Crafts, MD;  Location: [facility] INVASIVE CV LAB;  Service: Cardiovascular;  Laterality: N/A;    reports that he has never smoked. He has never used smokeless tobacco. He reports current alcohol use. He reports that he does not use drugs. No Known Allergies Family History  Problem Relation Age of Onset   Heart failure  Mother    Prior to Admission medications   Medication Sig Start Date End Date Taking? Authorizing Provider  albuterol (VENTOLIN HFA) 108 (90 Base) MCG/ACT inhaler Inhale 2 puffs into the lungs every 6 (six) hours as needed for wheezing or shortness of breath. 01/13/23   Leslye Peer, MD  amLODipine (NORVASC) 10 MG tablet Take 1 tablet (10 mg total) by mouth daily. 03/22/23   Rhetta Mura, MD  blood glucose meter kit and supplies Dispense based on patient and insurance preference. Use up to four times daily as directed. (FOR ICD-10 E10.9, E11.9). 05/24/22   de [place], Buren Kos, MD  chlorhexidine (HIBICLENS) 4 % external liquid Apply topically daily as needed. 08/11/23   Halford Decamp, PA-C  doxycycline (VIBRA-TABS) 100 MG tablet Take 1 tablet (100 mg total) by mouth every 12 (twelve) hours. 09/18/23   Azucena Fallen, MD  insulin aspart (NOVOLOG) 100 UNIT/ML FlexPen Inject 5 Units into the skin 3 (three) times daily before meals. 11/27/23   Grayce Sessions, NP  Insulin Glargine Solostar (LANTUS) 100 UNIT/ML Solostar Pen Inject 45 Units into the skin 2 (two) times daily. 09/18/23   Azucena Fallen, MD  lisinopril (ZESTRIL) 10 MG tablet Take 2 tablets (20 mg total) by mouth daily. 09/18/23   Azucena Fallen, MD  polyethylene glycol powder (GLYCOLAX/MIRALAX) 17 GM/SCOOP powder Take 17 g by mouth 2 (two) times daily. Patient taking differently: Take 17 g by mouth daily as needed for severe constipation. 03/22/23   Rhetta Mura, MD  senna-docusate (SENOKOT-S) 8.6-50 MG tablet Take 1 tablet by mouth 2 (two) times daily. Patient taking differently: Take 1 tablet by mouth daily as needed for moderate constipation. 03/22/23   Rhetta Mura, MD  torsemide (DEMADEX) 20 MG tablet Take 2 tablets (40 mg total) by mouth 2 (two) times daily. 02/08/23   Robbie Lis M, PA-C                                                                                 Vitals:    01/09/24 1126 01/09/24 1130 01/09/24 1138 01/09/24 1200  BP:      Pulse:  86  88  Resp:    16  Temp:   99.2 F (37.3 C)   TempSrc:   Oral   SpO2: (!) 86% 97%  97%  Weight:      Height:       Physical Exam Vitals and nursing note reviewed.  Constitutional:      General: He is not in acute distress.    Appearance: He is obese.  HENT:  Head: Normocephalic and atraumatic.     Right Ear: Hearing normal.     Left Ear: Hearing normal.     Nose: Nose normal. No nasal deformity.     Mouth/Throat:     Lips: Pink.     Tongue: No lesions.     Pharynx: Oropharynx is clear.  Eyes:     General: Lids are normal.     Extraocular Movements: Extraocular movements intact.  Cardiovascular:     Rate and Rhythm: Normal rate and regular rhythm.     Heart sounds: Normal heart sounds.  Pulmonary:     Effort: Pulmonary effort is normal.     Breath sounds: Examination of the right-middle field reveals rhonchi. Examination of the left-middle field reveals rhonchi. Examination of the right-lower field reveals rhonchi. Examination of the left-lower field reveals rhonchi. Rhonchi present.  Abdominal:     General: Bowel sounds are normal. There is no distension.     Palpations: Abdomen is soft. There is no mass.     Tenderness: There is no abdominal tenderness.  Musculoskeletal:     Right lower leg: 2+ Edema present.     Left lower leg: 2+ Edema present.  Skin:    General: Skin is warm.  Neurological:     General: No focal deficit present.     Mental Status: He is alert and oriented to person, place, and time.     Cranial Nerves: Cranial nerves 2-12 are intact.  Psychiatric:        Attention and Perception: Attention normal.        Mood and Affect: Mood normal.        Speech: Speech normal.        Behavior: Behavior normal. Behavior is cooperative.     Labs on Admission: I have personally reviewed following labs and imaging studies CBC: Recent Labs  Lab 01/09/24 0534  WBC 6.6  NEUTROABS  4.9  HGB 13.9  HCT 42.0  MCV 89.9  PLT 246   Basic Metabolic Panel: Recent Labs  Lab 01/09/24 0534  NA 134*  K 4.1  CL 104  CO2 19*  GLUCOSE 199*  BUN 35*  CREATININE 2.62*  CALCIUM 8.2*   GFR: Estimated Creatinine Clearance: 41.5 mL/min (A) (by C-G formula based on SCr of 2.62 mg/dL (H)). Liver Function Tests: Recent Labs  Lab 01/09/24 0534  AST 36  ALT 27  ALKPHOS 107  BILITOT 0.7  PROT 5.9*  ALBUMIN 2.0*   Recent Labs  Lab 01/09/24 0534  LIPASE 33   No results for input(s): "AMMONIA" in the last 168 hours. Coagulation Profile: No results for input(s): "INR", "PROTIME" in the last 168 hours. Cardiac Enzymes: No results for input(s): "CKTOTAL", "CKMB", "CKMBINDEX", "TROPONINI" in the last 168 hours. BNP (last 3 results) No results for input(s): "PROBNP" in the last 8760 hours. HbA1C: No results for input(s): "HGBA1C" in the last 72 hours. CBG: No results for input(s): "GLUCAP" in the last 168 hours. Lipid Profile: No results for input(s): "CHOL", "HDL", "LDLCALC", "TRIG", "CHOLHDL", "LDLDIRECT" in the last 72 hours. Thyroid Function Tests: No results for input(s): "TSH", "T4TOTAL", "FREET4", "T3FREE", "THYROIDAB" in the last 72 hours. Anemia Panel: No results for input(s): "VITAMINB12", "FOLATE", "FERRITIN", "TIBC", "IRON", "RETICCTPCT" in the last 72 hours. Urine analysis:    Component Value Date/Time   COLORURINE YELLOW 09/14/2023 0247   APPEARANCEUR HAZY (A) 09/14/2023 0247   LABSPEC 1.023 09/14/2023 0247   PHURINE 5.0 09/14/2023 0247   GLUCOSEU >=500 (A) 09/14/2023  0247   HGBUR SMALL (A) 09/14/2023 0247   BILIRUBINUR NEGATIVE 09/14/2023 0247   KETONESUR NEGATIVE 09/14/2023 0247   PROTEINUR >=300 (A) 09/14/2023 0247   UROBILINOGEN 0.2 08/07/2010 0020   NITRITE NEGATIVE 09/14/2023 0247   LEUKOCYTESUR NEGATIVE 09/14/2023 0247   Radiological Exams on Admission: VAS [place] LOWER EXTREMITY VENOUS (DVT) (7a-7p) Result Date: 01/09/2024  Lower Venous DVT  Study Patient Name:  Leroy Simmons  Date of Exam:   01/09/2024 Medical Rec #: [id number]       Accession #:    [id number] Date of Birth: 31-May-1971       Patient Gender: M Patient Age:   57 years Exam Location:  [facility] Procedure:      VAS [place] LOWER EXTREMITY VENOUS (DVT) Referring Phys: Lorin Picket [place] --------------------------------------------------------------------------------  Indications: Swelling, and Edema.  Risk Factors: Obesity. Comparison Study: No changes seen since previous exam 04/18/23. Performing Technologist: Shona Simpson  Examination Guidelines: A complete evaluation includes B-mode imaging, spectral Doppler, color Doppler, and power Doppler as needed of all accessible portions of each vessel. Bilateral testing is considered an integral part of a complete examination. Limited examinations for reoccurring indications may be performed as noted. The reflux portion of the exam is performed with the patient in reverse Trendelenburg.  +---------+---------------+---------+-----------+----------+--------------+ RIGHT    CompressibilityPhasicitySpontaneityPropertiesThrombus Aging +---------+---------------+---------+-----------+----------+--------------+ CFV      Full           Yes      Yes                                 +---------+---------------+---------+-----------+----------+--------------+ SFJ      Full                                                        +---------+---------------+---------+-----------+----------+--------------+ FV Prox  Full                                                        +---------+---------------+---------+-----------+----------+--------------+ FV Mid   Full                                                        +---------+---------------+---------+-----------+----------+--------------+ FV DistalFull                                                         +---------+---------------+---------+-----------+----------+--------------+ PFV      Full                                                        +---------+---------------+---------+-----------+----------+--------------+ POP      Full  Yes      Yes                                 +---------+---------------+---------+-----------+----------+--------------+ PTV      Full                                                        +---------+---------------+---------+-----------+----------+--------------+ PERO     Full                                                        +---------+---------------+---------+-----------+----------+--------------+   +---------+---------------+---------+-----------+----------+--------------+ LEFT     CompressibilityPhasicitySpontaneityPropertiesThrombus Aging +---------+---------------+---------+-----------+----------+--------------+ CFV      Full           Yes      Yes                                 +---------+---------------+---------+-----------+----------+--------------+ SFJ      Full                                                        +---------+---------------+---------+-----------+----------+--------------+ FV Prox  Full                                                        +---------+---------------+---------+-----------+----------+--------------+ FV Mid   Full                                                        +---------+---------------+---------+-----------+----------+--------------+ FV DistalFull                                                        +---------+---------------+---------+-----------+----------+--------------+ PFV      Full                                                        +---------+---------------+---------+-----------+----------+--------------+ POP      Full           Yes      Yes                                  +---------+---------------+---------+-----------+----------+--------------+ PTV  Full                                                        +---------+---------------+---------+-----------+----------+--------------+ PERO     Partial                                                     +---------+---------------+---------+-----------+----------+--------------+     Summary: BILATERAL: - No evidence of deep vein thrombosis seen in the lower extremities, bilaterally. -No evidence of popliteal cyst, bilaterally.   *See table(s) above for measurements and observations.    Preliminary    NM Pulmonary Perfusion Addendum Date: 01/09/2024 ADDENDUM REPORT: 01/09/2024 14:45 ADDENDUM: Critical Value/emergent results were called by telephone at the time of interpretation on 01/09/2024 at 2:45 pm to provider Pricilla Loveless , who verbally acknowledged these results. Electronically Signed   By: Signa Kell M.D.   On: 01/09/2024 14:45   Result Date: 01/09/2024 CLINICAL DATA:  Evaluate for acute pulmonary embolism. EXAM: NUCLEAR MEDICINE PERFUSION LUNG SCAN TECHNIQUE: Perfusion images were obtained in multiple projections after intravenous injection of radiopharmaceutical. Ventilation scans intentionally deferred if perfusion scan and chest x-ray adequate for interpretation during COVID 19 epidemic. RADIOPHARMACEUTICALS:  4.1 mCi Tc-1m MAA IV COMPARISON:  Chest radiograph from 01/09/2024 FINDINGS: There is a large perfusion defect identified within the lateral aspect of the right upper lobe without corresponding chest radiograph abnormality. No additional segmental perfusion defects identified. IMPRESSION: Examination is positive for single large segmental perfusion defect within the right upper lobe compatible with pulmonary embolism. Electronically Signed: By: Signa Kell M.D. On: 01/09/2024 14:28   CT ABDOMEN PELVIS WO CONTRAST Result Date: 01/09/2024 CLINICAL DATA:  Nausea, vomiting, right lower  quadrant abdominal pain. EXAM: CT ABDOMEN AND PELVIS WITHOUT CONTRAST TECHNIQUE: Multidetector CT imaging of the abdomen and pelvis was performed following the standard protocol without IV contrast. RADIATION DOSE REDUCTION: This exam was performed according to the departmental dose-optimization program which includes automated exposure control, adjustment of the mA and/or kV according to patient size and/or use of iterative reconstruction technique. COMPARISON:  September 25, 2022. FINDINGS: Lower chest: Mild bibasilar opacities are noted concerning for possible pneumonia. Stable 7 mm left basilar nodule. This can be considered benign at this point with no further follow-up required. Hepatobiliary: Small gallstone is noted. No biliary dilatation. Liver is unremarkable. Pancreas: Unremarkable. No pancreatic ductal dilatation or surrounding inflammatory changes. Spleen: Normal in size without focal abnormality. Adrenals/Urinary Tract: Adrenal glands are unremarkable. Kidneys are normal, without renal calculi, focal lesion, or hydronephrosis. Bladder is unremarkable. Stomach/Bowel: Stomach is unremarkable. Status post appendectomy. There is no evidence of bowel obstruction or inflammation. Vascular/Lymphatic: No significant vascular findings are present. No enlarged abdominal or pelvic lymph nodes. Reproductive: Prostate is unremarkable. Other: No ascites or hernia. Musculoskeletal: No acute or significant osseous findings. IMPRESSION: Mild bibasilar opacities are noted in visualized lung bases concerning for possible pneumonia. Small gallstone. No other significant abnormality seen in the abdomen or pelvis. Electronically Signed   By: Lupita Raider M.D.   On: 01/09/2024 10:55   DG Chest 2 View Result Date: 01/09/2024 CLINICAL DATA:  Infection. EXAM: CHEST - 2 VIEW COMPARISON:  09/13/2023 FINDINGS:  Cardiac enlargement. No pleural fluid. Pulmonary vascular congestion with increased interstitial markings are noted  bilaterally. No airspace consolidation identified. Visualized osseous structures are unremarkable. IMPRESSION: 1. Cardiac enlargement and pulmonary vascular congestion. 2. Increased interstitial markings bilaterally may reflect interstitial edema or atypical infection. Electronically Signed   By: Signa Kell M.D.   On: 01/09/2024 06:49   Data Reviewed: Relevant notes from primary care and specialist visits, past discharge summaries as available in EHR, including Care Everywhere. Prior diagnostic testing as pertinent to current admission diagnoses, Updated medications and problem lists for reconciliation ED course, including vitals, labs, imaging, treatment and response to treatment,Triage notes, nursing and pharmacy notes and ED provider's notes Notable results as noted in HPI.Discussed case with EDMD/ ED APP/ or Specialty MD on call and as needed.  Assessment & Plan  >> Shortness of breath/acute respiratory failure with hypoxia: Will admit to telemetry unit with cardiac monitoring. Admission requested for pneumonia suspect patient has noninfectious etiology stat VQ scan offered will d/c abx .  And is  pending lower extremity venous Dopplers. Pt has V/Q scan pos for PE we will start pt on heparin and get echo along with ct chest noncontrast    >> Right lung segmental PE: Heparin per pharmacy protocol.    >> Obesity/diabetes mellitus type 2: A1c, carb consistent diet coverage with glycemic protocol.  >> Essential hypertension: amlodipine continued/ lisinopril held will follow renal function.lasix 30 mg iv x 3 doses first now.  Vitals:   01/09/24 0516 01/09/24 0744 01/09/24 0950  BP: (!) 191/105 (!) 188/108 136/73   >>CKD stage IV: Renally dose meds and avoid contrast.   >>Diarrhea: 5 days , watery ,Culture and c.diff testing.   DVT prophylaxis:  AC with heparin gtt.  Consults:  None  Advance Care Planning:    Code Status: Prior   Family Communication:  None  Disposition  Plan:  Home  Severity of Illness: The appropriate patient status for this patient is OBSERVATION. Observation status is judged to be reasonable and necessary in order to provide the required intensity of service to ensure the patient's safety. The patient's presenting symptoms, physical exam findings, and initial radiographic and laboratory data in the context of their medical condition is felt to place them at decreased risk for further clinical deterioration. Furthermore, it is anticipated that the patient will be medically stable for discharge from the hospital within 2 midnights of admission.   Unresulted Labs (From admission, onward)     Start     Ordered   01/10/24 0500  Heparin level (unfractionated)  Daily,   R     Placed in "And" Linked Group   01/09/24 1454   01/10/24 0500  CBC  Daily,   R     Placed in "And" Linked Group   01/09/24 1454   01/10/24 0500  Comprehensive metabolic panel  Tomorrow morning,   R        01/09/24 1459   01/09/24 2100  Heparin level (unfractionated)  Once-Timed,   URGENT        01/09/24 1454   01/09/24 1342  Urinalysis, Complete w Microscopic -Urine, Random  Once,   URGENT       Question:  Specimen Source  Answer:  Urine, Random   01/09/24 1341   01/09/24 1216  MRSA Next Gen by PCR, Nasal  Once,   URGENT        01/09/24 1215   01/09/24 0825  Culture, blood (routine x 2)  BLOOD CULTURE  X 2,   R      01/09/24 0824   01/09/24 0522  Urinalysis, w/ Reflex to Culture (Infection Suspected) -Urine, Clean Catch  Once,   URGENT       Question:  Specimen Source  Answer:  Urine, Clean Catch   01/09/24 0522            Orders Placed This Encounter  Procedures   Resp panel by RT-PCR (RSV, Flu A&B, Covid) Anterior Nasal Swab   Culture, blood (routine x 2)   MRSA Next Gen by PCR, Nasal   DG Chest 2 View   CT ABDOMEN PELVIS WO CONTRAST   NM Pulmonary Perfusion   CT CHEST WO CONTRAST   Comprehensive metabolic panel   CBC with Differential   Urinalysis, w/  Reflex to Culture (Infection Suspected) -Urine, Clean Catch   Lipase, blood   Procalcitonin   Ethanol   D-dimer, quantitative   Brain natriuretic peptide   Urinalysis, Complete w Microscopic -Urine, Random   Heparin level (unfractionated)   Heparin level (unfractionated)   CBC   Comprehensive metabolic panel   Diet NPO time specified   ED Cardiac monitoring   Maintain IV access   Vital signs   Notify physician (specify)   Mobility Protocol: No Restrictions RN to initiate protocols based on patient's level of care   Refer to Sidebar Report Refer to ICU, Med-Surg, Progressive, and Step-Down Mobility Protocol Sidebars   Initiate Adult Central Line Maintenance and Catheter Protocol for patients with central line (CVC, PICC, Port, Hemodialysis, Trialysis)   Daily weights   Intake and Output   Do not place and if present remove PureWick   Initiate Oral Care Protocol   Initiate Carrier Fluid Protocol   RN may order General Admission PRN Orders utilizing "General Admission PRN medications" (through manage orders) for the following patient needs: allergy symptoms (Claritin), cold sores (Carmex), cough (Robitussin DM), eye irritation (Liquifilm Tears), hemorrhoids (Tucks), indigestion (Maalox), minor skin irritation (Hydrocortisone Cream), muscle pain Romeo Apple Gay), nose irritation (saline nasal spray) and sore throat (Chloraseptic spray).   Strict intake and output   Daily weights   Full code   Consult to hospitalist   heparin per pharmacy consult   Oxygen therapy Mode or (Route): Nasal cannula   Pulse oximetry check with vital signs   EKG 12-Lead   ED EKG   Repeat EKG   EKG 12-Lead   EKG   ECHOCARDIOGRAM COMPLETE BUBBLE STUDY   Place in observation (patient's expected length of stay will be less than 2 midnights)   VAS [place] LOWER EXTREMITY VENOUS (DVT) (7a-7p)    Author: Gertha Calkin, MD 12 pm -8 pm. 01/09/2024 2:59 PM >>Please note for any concern,or critical results after hour past 8pm  please contact the [facility] hospitalist [facility] floor coverage provider from 7 PM- 7 AM. For on call review [email or web address], username [email or web address] and PW: your phone number<<

## 2024-01-09 NOTE — ED Notes (Signed)
 RN attempted at second IV access unsuccessful. Another RN to attempt USGIV.

## 2024-01-09 NOTE — ED Notes (Signed)
Patient to [place]

## 2024-01-09 NOTE — ED Provider Notes (Signed)
  EMERGENCY DEPARTMENT AT [facility] Provider Note   CSN: [id number] Arrival date & time: 01/09/24  6213     History  Chief Complaint  Patient presents with   Headache   Chills    Leroy Simmons is a 53 y.o. male.  HPI 53 year old male presents feeling like he has an infection for the past 3 days.  He has not known to have a fever but has been having headache, cough, shortness of breath, nausea and vomiting and abdominal pain.  He has a prior history of abscesses but denies any abscess recently.  Has had some diarrhea. He's also having chest pain.  Home Medications Prior to Admission medications   Medication Sig Start Date End Date Taking? Authorizing Provider  albuterol (VENTOLIN HFA) 108 (90 Base) MCG/ACT inhaler Inhale 2 puffs into the lungs every 6 (six) hours as needed for wheezing or shortness of breath. 01/13/23   Leslye Peer, MD  amLODipine (NORVASC) 10 MG tablet Take 1 tablet (10 mg total) by mouth daily. 03/22/23   Rhetta Mura, MD  blood glucose meter kit and supplies Dispense based on patient and insurance preference. Use up to four times daily as directed. (FOR ICD-10 E10.9, E11.9). 05/24/22   de [place], Buren Kos, MD  chlorhexidine (HIBICLENS) 4 % external liquid Apply topically daily as needed. 08/11/23   Halford Decamp, PA-C  doxycycline (VIBRA-TABS) 100 MG tablet Take 1 tablet (100 mg total) by mouth every 12 (twelve) hours. 09/18/23   Azucena Fallen, MD  insulin aspart (NOVOLOG) 100 UNIT/ML FlexPen Inject 5 Units into the skin 3 (three) times daily before meals. 11/27/23   Grayce Sessions, NP  Insulin Glargine Solostar (LANTUS) 100 UNIT/ML Solostar Pen Inject 45 Units into the skin 2 (two) times daily. 09/18/23   Azucena Fallen, MD  lisinopril (ZESTRIL) 10 MG tablet Take 2 tablets (20 mg total) by mouth daily. 09/18/23   Azucena Fallen, MD  polyethylene glycol powder (GLYCOLAX/MIRALAX) 17 GM/SCOOP powder Take 17 g by  mouth 2 (two) times daily. Patient taking differently: Take 17 g by mouth daily as needed for severe constipation. 03/22/23   Rhetta Mura, MD  senna-docusate (SENOKOT-S) 8.6-50 MG tablet Take 1 tablet by mouth 2 (two) times daily. Patient taking differently: Take 1 tablet by mouth daily as needed for moderate constipation. 03/22/23   Rhetta Mura, MD  torsemide (DEMADEX) 20 MG tablet Take 2 tablets (40 mg total) by mouth 2 (two) times daily. 02/08/23   Allayne Butcher, PA-C      Allergies    Patient has no known allergies.    Review of Systems   Review of Systems  Constitutional:  Negative for fever.  Respiratory:  Positive for cough and shortness of breath.   Cardiovascular:  Negative for leg swelling.  Gastrointestinal:  Positive for abdominal pain, diarrhea, nausea and vomiting.  Genitourinary:  Negative for dysuria.  Neurological:  Positive for headaches.    Physical Exam Updated Vital Signs BP 136/73   Pulse 86   Temp 99.2 F (37.3 C) (Oral)   Resp (!) 23   Ht 5\' 8"  (1.727 m)   Wt 114.8 kg   SpO2 97%   BMI 38.47 kg/m  Physical Exam Vitals and nursing note reviewed.  Constitutional:      Appearance: He is well-developed.  HENT:     Head: Normocephalic and atraumatic.  Cardiovascular:     Rate and Rhythm: Regular rhythm. Tachycardia present.  Heart sounds: Normal heart sounds.  Pulmonary:     Effort: Pulmonary effort is normal.     Breath sounds: Normal breath sounds.  Abdominal:     Palpations: Abdomen is soft.     Tenderness: There is abdominal tenderness in the right upper quadrant and right lower quadrant.  Musculoskeletal:     Cervical back: No rigidity.  Skin:    General: Skin is warm and dry.  Neurological:     Mental Status: He is alert.     ED Results / Procedures / Treatments   Labs (all labs ordered are listed, but only abnormal results are displayed) Labs Reviewed  COMPREHENSIVE METABOLIC PANEL WITH GFR - Abnormal;  Notable for the following components:      Result Value   Sodium 134 (*)    CO2 19 (*)    Glucose, Bld 199 (*)    BUN 35 (*)    Creatinine, Ser 2.62 (*)    Calcium 8.2 (*)    Total Protein 5.9 (*)    Albumin 2.0 (*)    GFR, Estimated 29 (*)    All other components within normal limits  TROPONIN I (HIGH SENSITIVITY) - Abnormal; Notable for the following components:   Troponin I (High Sensitivity) 164 (*)    All other components within normal limits  TROPONIN I (HIGH SENSITIVITY) - Abnormal; Notable for the following components:   Troponin I (High Sensitivity) 174 (*)    All other components within normal limits  RESP PANEL BY RT-PCR (RSV, FLU A&B, COVID)  RVPGX2  CULTURE, BLOOD (ROUTINE X 2)  CULTURE, BLOOD (ROUTINE X 2)  MRSA NEXT GEN BY PCR, NASAL  CBC WITH DIFFERENTIAL/PLATELET  LIPASE, BLOOD  URINALYSIS, W/ REFLEX TO CULTURE (INFECTION SUSPECTED)  PROCALCITONIN  ETHANOL  D-DIMER, QUANTITATIVE  BRAIN NATRIURETIC PEPTIDE  I-STAT CG4 LACTIC ACID, ED  TROPONIN I (HIGH SENSITIVITY)    EKG EKG Interpretation Date/Time:  Tuesday January 09 2024 05:21:37 EDT Ventricular Rate:  102 PR Interval:  130 QRS Duration:  68 QT Interval:  328 QTC Calculation: 427 R Axis:   6  Text Interpretation: Sinus tachycardia Possible Left atrial enlargement ST & T wave abnormality, consider lateral ischemia Confirmed by Pricilla Loveless [phone]) on 01/09/2024 7:34:39 AM  Radiology CT ABDOMEN PELVIS WO CONTRAST Result Date: 01/09/2024 CLINICAL DATA:  Nausea, vomiting, right lower quadrant abdominal pain. EXAM: CT ABDOMEN AND PELVIS WITHOUT CONTRAST TECHNIQUE: Multidetector CT imaging of the abdomen and pelvis was performed following the standard protocol without IV contrast. RADIATION DOSE REDUCTION: This exam was performed according to the departmental dose-optimization program which includes automated exposure control, adjustment of the mA and/or kV according to patient size and/or use of iterative  reconstruction technique. COMPARISON:  September 25, 2022. FINDINGS: Lower chest: Mild bibasilar opacities are noted concerning for possible pneumonia. Stable 7 mm left basilar nodule. This can be considered benign at this point with no further follow-up required. Hepatobiliary: Small gallstone is noted. No biliary dilatation. Liver is unremarkable. Pancreas: Unremarkable. No pancreatic ductal dilatation or surrounding inflammatory changes. Spleen: Normal in size without focal abnormality. Adrenals/Urinary Tract: Adrenal glands are unremarkable. Kidneys are normal, without renal calculi, focal lesion, or hydronephrosis. Bladder is unremarkable. Stomach/Bowel: Stomach is unremarkable. Status post appendectomy. There is no evidence of bowel obstruction or inflammation. Vascular/Lymphatic: No significant vascular findings are present. No enlarged abdominal or pelvic lymph nodes. Reproductive: Prostate is unremarkable. Other: No ascites or hernia. Musculoskeletal: No acute or significant osseous findings. IMPRESSION: Mild bibasilar opacities  are noted in visualized lung bases concerning for possible pneumonia. Small gallstone. No other significant abnormality seen in the abdomen or pelvis. Electronically Signed   By: Lupita Raider M.D.   On: 01/09/2024 10:55   DG Chest 2 View Result Date: 01/09/2024 CLINICAL DATA:  Infection. EXAM: CHEST - 2 VIEW COMPARISON:  09/13/2023 FINDINGS: Cardiac enlargement. No pleural fluid. Pulmonary vascular congestion with increased interstitial markings are noted bilaterally. No airspace consolidation identified. Visualized osseous structures are unremarkable. IMPRESSION: 1. Cardiac enlargement and pulmonary vascular congestion. 2. Increased interstitial markings bilaterally may reflect interstitial edema or atypical infection. Electronically Signed   By: Signa Kell M.D.   On: 01/09/2024 06:49    Procedures Procedures    Medications Ordered in ED Medications  cefTRIAXone  (ROCEPHIN) 1 g in sodium chloride 0.9 % 100 mL IVPB (1 g Intravenous New Bag/Given 01/09/24 1200)  azithromycin (ZITHROMAX) 500 mg in sodium chloride 0.9 % 250 mL IVPB (has no administration in time range)  thiamine (VITAMIN B1) injection 100 mg (has no administration in time range)  albuterol (PROVENTIL) (2.5 MG/3ML) 0.083% nebulizer solution 3 mL (has no administration in time range)  acetaminophen (TYLENOL) tablet 1,000 mg (1,000 mg Oral Given 01/09/24 0819)  lactated ringers bolus 1,000 mL (0 mLs Intravenous Stopped 01/09/24 1156)  metoCLOPramide (REGLAN) injection 5 mg (5 mg Intravenous Given 01/09/24 0941)  aspirin chewable tablet 324 mg (324 mg Oral Given 01/09/24 0941)    ED Course/ Medical Decision Making/ A&P                                 Medical Decision Making Amount and/or Complexity of Data Reviewed Labs: ordered.    Details: Normal WBC and lactate Radiology: ordered and independent interpretation performed.    Details: No lobar pneumonia, but CT shows atypical pneumonia. ECG/medicine tests: ordered and independent interpretation performed.    Details: Diffuse ST/T changes  Risk OTC drugs. Prescription drug management. Decision regarding hospitalization.   Patient presents with multiple symptoms concerning for infection.  Likely this is coming from his atypical pneumonia found on chest x-ray and later CT of his abdomen and pelvis.  No other findings found.  He has mildly worsened but overall similar renal dysfunction to baseline.  COVID and flu testing is negative.  He did drop his sats into the mid 80s, was put on 2 L of oxygen.  His blood pressure is improving, likely from pain/symptom control.  Troponins are elevated but flat and probably more related to his overall illness rather than ACS.  He was started on IV Rocephin and azithromycin.  I discussed case with Dr. Allena Katz, who will admit.  However given his prior history of DVTs she would like PE/DVT workup and so DVT  ultrasound has been ordered as well as VQ scan.        Final Clinical Impression(s) / ED Diagnoses Final diagnoses:  Acute respiratory failure with hypoxia [facility])    Rx / DC Orders ED Discharge Orders     None         Pricilla Loveless, MD 01/09/24 1230

## 2024-01-10 ENCOUNTER — Observation Stay (HOSPITAL_COMMUNITY)

## 2024-01-10 DIAGNOSIS — N179 Acute kidney failure, unspecified: Secondary | ICD-10-CM | POA: Diagnosis present

## 2024-01-10 DIAGNOSIS — J9601 Acute respiratory failure with hypoxia: Secondary | ICD-10-CM

## 2024-01-10 DIAGNOSIS — F419 Anxiety disorder, unspecified: Secondary | ICD-10-CM

## 2024-01-10 DIAGNOSIS — I2510 Atherosclerotic heart disease of native coronary artery without angina pectoris: Secondary | ICD-10-CM | POA: Diagnosis present

## 2024-01-10 DIAGNOSIS — D638 Anemia in other chronic diseases classified elsewhere: Secondary | ICD-10-CM

## 2024-01-10 DIAGNOSIS — E872 Acidosis, unspecified: Secondary | ICD-10-CM | POA: Diagnosis not present

## 2024-01-10 DIAGNOSIS — D631 Anemia in chronic kidney disease: Secondary | ICD-10-CM | POA: Diagnosis present

## 2024-01-10 DIAGNOSIS — Z1152 Encounter for screening for COVID-19: Secondary | ICD-10-CM | POA: Diagnosis not present

## 2024-01-10 DIAGNOSIS — E669 Obesity, unspecified: Secondary | ICD-10-CM | POA: Diagnosis not present

## 2024-01-10 DIAGNOSIS — E785 Hyperlipidemia, unspecified: Secondary | ICD-10-CM | POA: Diagnosis present

## 2024-01-10 DIAGNOSIS — I2699 Other pulmonary embolism without acute cor pulmonale: Secondary | ICD-10-CM | POA: Diagnosis present

## 2024-01-10 DIAGNOSIS — N184 Chronic kidney disease, stage 4 (severe): Secondary | ICD-10-CM

## 2024-01-10 DIAGNOSIS — I5033 Acute on chronic diastolic (congestive) heart failure: Secondary | ICD-10-CM | POA: Diagnosis present

## 2024-01-10 DIAGNOSIS — Z794 Long term (current) use of insulin: Secondary | ICD-10-CM

## 2024-01-10 DIAGNOSIS — R079 Chest pain, unspecified: Secondary | ICD-10-CM | POA: Diagnosis present

## 2024-01-10 DIAGNOSIS — I2693 Single subsegmental pulmonary embolism without acute cor pulmonale: Secondary | ICD-10-CM | POA: Diagnosis not present

## 2024-01-10 DIAGNOSIS — Z6836 Body mass index (BMI) 36.0-36.9, adult: Secondary | ICD-10-CM | POA: Diagnosis not present

## 2024-01-10 DIAGNOSIS — N189 Chronic kidney disease, unspecified: Secondary | ICD-10-CM

## 2024-01-10 DIAGNOSIS — N1832 Chronic kidney disease, stage 3b: Secondary | ICD-10-CM | POA: Diagnosis present

## 2024-01-10 DIAGNOSIS — F32A Depression, unspecified: Secondary | ICD-10-CM

## 2024-01-10 DIAGNOSIS — E1165 Type 2 diabetes mellitus with hyperglycemia: Secondary | ICD-10-CM

## 2024-01-10 DIAGNOSIS — E8809 Other disorders of plasma-protein metabolism, not elsewhere classified: Secondary | ICD-10-CM | POA: Diagnosis not present

## 2024-01-10 DIAGNOSIS — T502X5A Adverse effect of carbonic-anhydrase inhibitors, benzothiadiazides and other diuretics, initial encounter: Secondary | ICD-10-CM | POA: Diagnosis not present

## 2024-01-10 DIAGNOSIS — I82452 Acute embolism and thrombosis of left peroneal vein: Secondary | ICD-10-CM | POA: Diagnosis not present

## 2024-01-10 DIAGNOSIS — B001 Herpesviral vesicular dermatitis: Secondary | ICD-10-CM | POA: Diagnosis present

## 2024-01-10 DIAGNOSIS — A084 Viral intestinal infection, unspecified: Secondary | ICD-10-CM | POA: Diagnosis present

## 2024-01-10 DIAGNOSIS — E876 Hypokalemia: Secondary | ICD-10-CM | POA: Diagnosis not present

## 2024-01-10 DIAGNOSIS — E1122 Type 2 diabetes mellitus with diabetic chronic kidney disease: Secondary | ICD-10-CM | POA: Diagnosis present

## 2024-01-10 DIAGNOSIS — Z9049 Acquired absence of other specified parts of digestive tract: Secondary | ICD-10-CM | POA: Diagnosis not present

## 2024-01-10 DIAGNOSIS — I130 Hypertensive heart and chronic kidney disease with heart failure and stage 1 through stage 4 chronic kidney disease, or unspecified chronic kidney disease: Secondary | ICD-10-CM | POA: Diagnosis present

## 2024-01-10 DIAGNOSIS — E66812 Obesity, class 2: Secondary | ICD-10-CM | POA: Diagnosis present

## 2024-01-10 DIAGNOSIS — J189 Pneumonia, unspecified organism: Secondary | ICD-10-CM | POA: Diagnosis present

## 2024-01-10 LAB — COMPREHENSIVE METABOLIC PANEL WITH GFR
ALT: 28 U/L (ref 0–44)
AST: 39 U/L (ref 15–41)
Albumin: 1.7 g/dL — ABNORMAL LOW (ref 3.5–5.0)
Alkaline Phosphatase: 92 U/L (ref 38–126)
Anion gap: 11 (ref 5–15)
BUN: 39 mg/dL — ABNORMAL HIGH (ref 6–20)
CO2: 19 mmol/L — ABNORMAL LOW (ref 22–32)
Calcium: 7.7 mg/dL — ABNORMAL LOW (ref 8.9–10.3)
Chloride: 104 mmol/L (ref 98–111)
Creatinine, Ser: 3.03 mg/dL — ABNORMAL HIGH (ref 0.61–1.24)
GFR, Estimated: 24 mL/min — ABNORMAL LOW (ref >60)
Glucose, Bld: 129 mg/dL — ABNORMAL HIGH (ref 70–99)
Potassium: 3.5 mmol/L (ref 3.5–5.1)
Sodium: 134 mmol/L — ABNORMAL LOW (ref 135–145)
Total Bilirubin: 0.9 mg/dL (ref 0.0–1.2)
Total Protein: 5.4 g/dL — ABNORMAL LOW (ref 6.5–8.1)

## 2024-01-10 LAB — ECHOCARDIOGRAM COMPLETE BUBBLE STUDY
AR max vel: 3.49 cm2
AV Peak grad: 6.7 mmHg
Ao pk vel: 1.29 m/s
Area-P 1/2: 5.02 cm2
S' Lateral: 2.8 cm

## 2024-01-10 LAB — HIV ANTIBODY (ROUTINE TESTING W REFLEX): HIV Screen 4th Generation wRfx: NONREACTIVE

## 2024-01-10 LAB — CBC
HCT: 37.2 % — ABNORMAL LOW (ref 39.0–52.0)
Hemoglobin: 12.3 g/dL — ABNORMAL LOW (ref 13.0–17.0)
MCH: 29.1 pg (ref 26.0–34.0)
MCHC: 33.1 g/dL (ref 30.0–36.0)
MCV: 88.2 fL (ref 80.0–100.0)
Platelets: 220 10*3/uL (ref 150–400)
RBC: 4.22 MIL/uL (ref 4.22–5.81)
RDW: 12.5 % (ref 11.5–15.5)
WBC: 6.3 10*3/uL (ref 4.0–10.5)
nRBC: 0 % (ref 0.0–0.2)

## 2024-01-10 LAB — GASTROINTESTINAL PANEL BY PCR, STOOL (REPLACES STOOL CULTURE)

## 2024-01-10 LAB — GLUCOSE, CAPILLARY
Glucose-Capillary: 125 mg/dL — ABNORMAL HIGH (ref 70–99)
Glucose-Capillary: 188 mg/dL — ABNORMAL HIGH (ref 70–99)

## 2024-01-10 LAB — HEPARIN LEVEL (UNFRACTIONATED): Heparin Unfractionated: 0.54 [IU]/mL (ref 0.30–0.70)

## 2024-01-10 LAB — CBG MONITORING, ED
Glucose-Capillary: 135 mg/dL — ABNORMAL HIGH (ref 70–99)
Glucose-Capillary: 149 mg/dL — ABNORMAL HIGH (ref 70–99)

## 2024-01-10 MED ORDER — FLUTICASONE PROPIONATE 50 MCG/ACT NA SUSP
2.0000 | Freq: Every day | NASAL | Status: DC
  Administered 2024-01-11 – 2024-01-15 (×5): 2 via NASAL
  Filled 2024-01-10 (×2): qty 16

## 2024-01-10 MED ORDER — ALBUMIN HUMAN 25 % IV SOLN
25.0000 g | Freq: Once | INTRAVENOUS | Status: AC
  Administered 2024-01-10: 25 g via INTRAVENOUS
  Filled 2024-01-10: qty 100

## 2024-01-10 MED ORDER — FUROSEMIDE 10 MG/ML IJ SOLN
40.0000 mg | Freq: Two times a day (BID) | INTRAMUSCULAR | Status: DC
  Administered 2024-01-10 (×2): 40 mg via INTRAVENOUS
  Filled 2024-01-10 (×2): qty 4

## 2024-01-10 MED ORDER — GUAIFENESIN ER 600 MG PO TB12
1200.0000 mg | ORAL_TABLET | Freq: Two times a day (BID) | ORAL | Status: DC
  Administered 2024-01-10 – 2024-01-19 (×19): 1200 mg via ORAL
  Filled 2024-01-10 (×19): qty 2

## 2024-01-10 MED ORDER — ACETAMINOPHEN 325 MG PO TABS
650.0000 mg | ORAL_TABLET | Freq: Four times a day (QID) | ORAL | Status: DC | PRN
  Administered 2024-01-10 – 2024-01-11 (×4): 650 mg via ORAL
  Filled 2024-01-10 (×4): qty 2

## 2024-01-10 MED ORDER — LORATADINE 10 MG PO TABS
10.0000 mg | ORAL_TABLET | Freq: Every day | ORAL | Status: DC
  Administered 2024-01-10 – 2024-01-19 (×9): 10 mg via ORAL
  Filled 2024-01-10 (×10): qty 1

## 2024-01-10 MED ORDER — INSULIN ASPART 100 UNIT/ML IJ SOLN
0.0000 [IU] | Freq: Three times a day (TID) | INTRAMUSCULAR | Status: DC
  Administered 2024-01-10 – 2024-01-13 (×7): 1 [IU] via SUBCUTANEOUS
  Administered 2024-01-14: 2 [IU] via SUBCUTANEOUS
  Administered 2024-01-14: 3 [IU] via SUBCUTANEOUS
  Administered 2024-01-14: 1 [IU] via SUBCUTANEOUS
  Administered 2024-01-15: 4 [IU] via SUBCUTANEOUS
  Administered 2024-01-15: 1 [IU] via SUBCUTANEOUS
  Administered 2024-01-15: 2 [IU] via SUBCUTANEOUS
  Administered 2024-01-16: 1 [IU] via SUBCUTANEOUS
  Administered 2024-01-16: 7 [IU] via SUBCUTANEOUS
  Administered 2024-01-16 – 2024-01-17 (×2): 3 [IU] via SUBCUTANEOUS
  Administered 2024-01-17 – 2024-01-18 (×3): 2 [IU] via SUBCUTANEOUS
  Administered 2024-01-18: 5 [IU] via SUBCUTANEOUS
  Administered 2024-01-18 – 2024-01-19 (×2): 3 [IU] via SUBCUTANEOUS
  Administered 2024-01-19: 1 [IU] via SUBCUTANEOUS

## 2024-01-10 MED ORDER — AMLODIPINE BESYLATE 10 MG PO TABS
10.0000 mg | ORAL_TABLET | Freq: Every day | ORAL | Status: DC
  Administered 2024-01-10 – 2024-01-16 (×7): 10 mg via ORAL
  Filled 2024-01-10 (×5): qty 1
  Filled 2024-01-10: qty 2
  Filled 2024-01-10: qty 1

## 2024-01-10 MED ORDER — LOPERAMIDE HCL 2 MG PO CAPS
2.0000 mg | ORAL_CAPSULE | ORAL | Status: DC | PRN
  Administered 2024-01-11 (×3): 2 mg via ORAL
  Filled 2024-01-10 (×4): qty 1

## 2024-01-10 MED ORDER — GUAIFENESIN 100 MG/5ML PO LIQD
5.0000 mL | ORAL | Status: DC | PRN
  Administered 2024-01-10 – 2024-01-12 (×7): 5 mL via ORAL
  Filled 2024-01-10 (×7): qty 10

## 2024-01-10 MED ORDER — INSULIN GLARGINE-YFGN 100 UNIT/ML ~~LOC~~ SOLN
20.0000 [IU] | Freq: Two times a day (BID) | SUBCUTANEOUS | Status: DC
  Administered 2024-01-10 – 2024-01-16 (×13): 20 [IU] via SUBCUTANEOUS
  Filled 2024-01-10 (×14): qty 0.2

## 2024-01-10 NOTE — Progress Notes (Signed)
 Echocardiogram 2D Echocardiogram has been performed.  Lucendia Herrlich 01/10/2024, 11:45 AM

## 2024-01-10 NOTE — Care Management Obs Status (Signed)
 MEDICARE OBSERVATION STATUS NOTIFICATION   Patient Details  Name: Leroy Simmons MRN: [id number] Date of Birth: [birth year]   Medicare Observation Status Notification Given:  Yes    Oletta Cohn, RN 01/10/2024, 11:53 AM

## 2024-01-10 NOTE — Progress Notes (Signed)
 PROGRESS NOTE    Leroy Simmons  QMV:[id number] DOB: Nov 15, 1970 DOA: 01/09/2024 PCP: Grayce Sessions, NP    Chief Complaint  Patient presents with   Headache   Chills    Brief Narrative:  Patient 53 year old gentleman history of hypertension, CHF, diabetes type 2, CKD stage IIIb, anxiety/depression, history of DVT not on anticoagulation recurrent falls presenting with headaches, chills, cough, congestion, nausea, vomiting, diarrhea.  Patient seen in the ED noted to be in acute hypoxic respiratory failure requiring O2, VQ scan done concerning for PE, CT chest done concerning for multifocal pneumonia versus volume overload.  Patient placed on IV heparin and empiric IV antibiotics.    Assessment & Plan:   Principal Problem:   Pulmonary embolism (HCC) Active Problems:   Acute on chronic diastolic CHF (congestive heart failure) (HCC)   NSTEMI (non-ST elevated myocardial infarction) (HCC)   Acute kidney injury superimposed on CKD (HCC)   Uncontrolled type 2 diabetes mellitus with hyperglycemia, with long-term current use of insulin (HCC)   Deep venous thrombosis (DVT) of left peroneal vein (HCC)   Anemia of chronic disease   Obesity (BMI 30-39.9)   Diabetes mellitus (HCC)   Anxiety and depression   Coronary artery disease involving native coronary artery of native heart without angina pectoris   CKD (chronic kidney disease) stage 4, GFR 15-29 ml/min (HCC)  #1 acute hypoxic respiratory failure with hypoxia likely multifactorial secondary to acute PE/multifocal pneumonia versus acute diastolic CHF exacerbation. -Patient noted to present with cough, congestion, shortness of breath, noted to be hypoxic with sats of 86% on room air on presentation.  Patient history of DVT. -VQ scan done positive for large single segmental perfusion defect within the right upper lobe compatible with PE. -Lower extremity Dopplers done negative for DVT. -CT chest without contrast done nodularity  throughout both lungs with a basilar predominant distribution.  Favor multifocal pneumonia.  Smooth septal thickening suspicious for pulmonary edema.  Mild mediastinal adenopathy likely reactive.  Cardiomegaly with coronary artery calcifications and small pericardial effusion.  Dependent skin thickening with area of subcutaneous edema may represent cellulitis.  No obvious drainable collection in the absence of IV contrast. -2D echo ordered and pending. -Patient with lower extremity edema on examination and diffuse crackles noted. -Check a urine Legionella antigen, urine pneumococcus antigen. -Continue empiric antibiotics of vancomycin and Zosyn. -Continue heparin drip. -Place on Lasix 40 mg IV every 12 hours x 4 doses. -Strict I's and O's, daily weights.  2.  Right upper lobe PE -Continue IV heparin drip. -Could likely transition to oral DOAC in the next 48 to 72 hours if continued clinical improvement. -Patient with prior history of DVT, now with acute PE, will likely require lifetime anticoagulation. -Outpatient follow-up with hematology.  3.  Poorly controlled diabetes mellitus type 2 -Hemoglobin A1c 10.8 (09/09/2023). -Repeat hemoglobin A1c. -Place on full liquid diet and if tolerates advance to soft diet in the AM. -Per med rec patient on Lantus 45 units twice daily and NovoLog meal coverage insulin 5 units 3 times daily. -Place on Semglee 20 units twice daily, SSI for now.  4.  Diarrhea -Likely secondary to viral gastroenteritis. -C. difficile PCR negative. -GI pathogen panel negative. -Imodium as needed.  5.  Hypertension -Continue home regimen Norvasc 10 mg daily. -Patient started on IV Lasix. -Follow-up.  6.  AKI on CKD stage IIIb/IV -Check a urine sodium, urine creatinine.   -Patient started on IV Lasix.   -Monitor renal function with diuresis.   DVT  prophylaxis: Heparin GTT Code Status: Full Family Communication: Update me at bedside. Disposition: Likely home when  clinically improved  Status is: Inpatient The patient will require care spanning > 2 midnights and should be moved to inpatient because: Severity of illness   Consultants:  None  Procedures:  CT chest without contrast 01/09/2024 CT abdomen and pelvis 01/09/2024 Chest x-ray 01/09/2024 VQ scan 01/09/2024 2D echo pending Lower extremity Dopplers 01/09/2024  Antimicrobials:  Anti-infectives (From admission, onward)    Start     Dose/Rate Route Frequency Ordered Stop   01/11/24 2200  vancomycin (VANCOREADY) IVPB 1750 mg/350 mL        1,750 mg 175 mL/hr over 120 Minutes Intravenous Every 48 hours 01/09/24 2351     01/10/24 0000  piperacillin-tazobactam (ZOSYN) IVPB 3.375 g        3.375 g 12.5 mL/hr over 240 Minutes Intravenous Every 8 hours 01/09/24 2347     01/10/24 0000  vancomycin (VANCOREADY) IVPB 2000 mg/400 mL        2,000 mg 200 mL/hr over 120 Minutes Intravenous  Once 01/09/24 2348 01/10/24 0239   01/09/24 1200  cefTRIAXone (ROCEPHIN) 1 g in sodium chloride 0.9 % 100 mL IVPB        1 g 200 mL/hr over 30 Minutes Intravenous  Once 01/09/24 1154 01/09/24 1233   01/09/24 1200  azithromycin (ZITHROMAX) 500 mg in sodium chloride 0.9 % 250 mL IVPB        500 mg 250 mL/hr over 60 Minutes Intravenous  Once 01/09/24 1154 01/09/24 1504         Subjective: Patient laying on gurney.  States he feels dehydrated.  Feels some improvement with shortness of breath and chest pain.  Does endorse some nausea but no further emesis.  Stated last watery bowel movement was yesterday.  No bowel movement today.  2D echo technician at bedside.  Objective: Vitals:   01/10/24 1400 01/10/24 1500 01/10/24 1518 01/10/24 1542  BP: 128/74 (!) 160/89  (!) 140/77  Pulse: 91 98  97  Resp: (!) 30 (!) 22  (!) 21  Temp:   98.8 F (37.1 C) 99.9 F (37.7 C)  TempSrc:   Oral Oral  SpO2: 97% 95%  97%  Weight:      Height:        Intake/Output Summary (Last 24 hours) at 01/10/2024 1554 Last data filed at 01/10/2024  1245 Gross per 24 hour  Intake 620 ml  Output 1925 ml  Net -1305 ml   Filed Weights   01/09/24 0519  Weight: 114.8 kg    Examination:  General exam: NAD. Respiratory system: Some diffuse crackles/coarse breath sounds.  No wheezing.  Fair air movement.  Speaking in full sentences.  On 3 L nasal cannula.  Cardiovascular system: S1 & S2 heard, RRR. No JVD, murmurs, rubs, gallops or clicks.  2+ bilateral lower extremity edema. Gastrointestinal system: Abdomen is nondistended, soft and nontender. No organomegaly or masses felt. Normal bowel sounds heard. Central nervous system: Alert and oriented. No focal neurological deficits. Extremities: 2+ bilateral lower extremity edema.   Skin: No rashes, lesions or ulcers Psychiatry: Judgement and insight appear normal. Mood & affect appropriate.     Data Reviewed: I have personally reviewed following labs and imaging studies  CBC: Recent Labs  Lab 01/09/24 0534 01/10/24 0508  WBC 6.6 6.3  NEUTROABS 4.9  --   HGB 13.9 12.3*  HCT 42.0 37.2*  MCV 89.9 88.2  PLT 246 220  Basic Metabolic Panel: Recent Labs  Lab 01/09/24 0534 01/10/24 0508  NA 134* 134*  K 4.1 3.5  CL 104 104  CO2 19* 19*  GLUCOSE 199* 129*  BUN 35* 39*  CREATININE 2.62* 3.03*  CALCIUM 8.2* 7.7*    GFR: Estimated Creatinine Clearance: 35.9 mL/min (A) (by C-G formula based on SCr of 3.03 mg/dL (H)).  Liver Function Tests: Recent Labs  Lab 01/09/24 0534 01/10/24 0508  AST 36 39  ALT 27 28  ALKPHOS 107 92  BILITOT 0.7 0.9  PROT 5.9* 5.4*  ALBUMIN 2.0* 1.7*    CBG: Recent Labs  Lab 01/10/24 1142 01/10/24 1200  GLUCAP 135* 149*     Recent Results (from the past 240 hours)  Resp panel by RT-PCR (RSV, Flu A&B, Covid) Anterior Nasal Swab     Status: None   Collection Time: 01/09/24  5:23 AM   Specimen: Anterior Nasal Swab  Result Value Ref Range Status   SARS Coronavirus 2 by RT PCR NEGATIVE NEGATIVE Final   Influenza A by PCR NEGATIVE  NEGATIVE Final   Influenza B by PCR NEGATIVE NEGATIVE Final    Comment: (NOTE) The Xpert Xpress SARS-CoV-2/FLU/RSV plus assay is intended as an aid in the diagnosis of influenza from Nasopharyngeal swab specimens and should not be used as a sole basis for treatment. Nasal washings and aspirates are unacceptable for Xpert Xpress SARS-CoV-2/FLU/RSV testing.  Fact Sheet for Patients: BloggerCourse.com  Fact Sheet for Healthcare Providers: SeriousBroker.it  This test is not yet approved or cleared by the [place] FDA and has been authorized for detection and/or diagnosis of SARS-CoV-2 by FDA under an Emergency Use Authorization (EUA). This EUA will remain in effect (meaning this test can be used) for the duration of the COVID-19 declaration under Section 564(b)(1) of the Act, 21 U.S.C. section 360bbb-3(b)(1), unless the authorization is terminated or revoked.     Resp Syncytial Virus by PCR NEGATIVE NEGATIVE Final    Comment: (NOTE) Fact Sheet for Patients: BloggerCourse.com  Fact Sheet for Healthcare Providers: SeriousBroker.it  This test is not yet approved or cleared by the [place] FDA and has been authorized for detection and/or diagnosis of SARS-CoV-2 by FDA under an Emergency Use Authorization (EUA). This EUA will remain in effect (meaning this test can be used) for the duration of the COVID-19 declaration under Section 564(b)(1) of the Act, 21 U.S.C. section 360bbb-3(b)(1), unless the authorization is terminated or revoked.  Performed at [facility], [address], [place], [place] [address]   Culture, blood (routine x 2)     Status: None (Preliminary result)   Collection Time: 01/09/24  8:25 AM   Specimen: BLOOD LEFT FOREARM  Result Value Ref Range Status   Specimen Description BLOOD LEFT FOREARM  Final   Special Requests   Final    BOTTLES DRAWN  AEROBIC AND ANAEROBIC Blood Culture results may not be optimal due to an inadequate volume of blood received in culture bottles   Culture   Final    NO GROWTH < 24 HOURS Performed at [facility], [address], [place], [place] [address]    Report Status PENDING  Incomplete  Culture, blood (routine x 2)     Status: None (Preliminary result)   Collection Time: 01/09/24  9:43 AM   Specimen: BLOOD LEFT HAND  Result Value Ref Range Status   Specimen Description BLOOD LEFT HAND  Final   Special Requests   Final    BOTTLES  DRAWN AEROBIC AND ANAEROBIC Blood Culture results may not be optimal due to an inadequate volume of blood received in culture bottles   Culture   Final    NO GROWTH < 24 HOURS Performed at [facility], [address] [place], [place] [address]    Report Status PENDING  Incomplete  Gastrointestinal Panel by PCR , Stool     Status: None   Collection Time: 01/09/24  5:03 PM   Specimen: Stool  Result Value Ref Range Status   Campylobacter species NOT DETECTED NOT DETECTED Final   Plesimonas shigelloides NOT DETECTED NOT DETECTED Final   Salmonella species NOT DETECTED NOT DETECTED Final   Yersinia enterocolitica NOT DETECTED NOT DETECTED Final   Vibrio species NOT DETECTED NOT DETECTED Final   Vibrio cholerae NOT DETECTED NOT DETECTED Final   Enteroaggregative E coli (EAEC) NOT DETECTED NOT DETECTED Final   Enteropathogenic E coli (EPEC) NOT DETECTED NOT DETECTED Final   Enterotoxigenic E coli (ETEC) NOT DETECTED NOT DETECTED Final   Shiga like toxin producing E coli (STEC) NOT DETECTED NOT DETECTED Final   Shigella/Enteroinvasive E coli (EIEC) NOT DETECTED NOT DETECTED Final   Cryptosporidium NOT DETECTED NOT DETECTED Final   Cyclospora cayetanensis NOT DETECTED NOT DETECTED Final   Entamoeba histolytica NOT DETECTED NOT DETECTED Final   Giardia lamblia NOT DETECTED NOT DETECTED Final   Adenovirus F40/41 NOT DETECTED NOT DETECTED Final   Astrovirus NOT  DETECTED NOT DETECTED Final   Norovirus GI/GII NOT DETECTED NOT DETECTED Final   Rotavirus A NOT DETECTED NOT DETECTED Final   Sapovirus (I, II, IV, and V) NOT DETECTED NOT DETECTED Final    Comment: Performed at [facility], [address] [place], [place] [address]  C Difficile Quick Screen w PCR reflex     Status: None   Collection Time: 01/09/24  5:03 PM   Specimen: Stool  Result Value Ref Range Status   C Diff antigen NEGATIVE NEGATIVE Final   C Diff toxin NEGATIVE NEGATIVE Final   C Diff interpretation No C. difficile detected.  Final    Comment: Performed at [facility], [address], [place], [place] [address]         Radiology Studies: VAS [place] LOWER EXTREMITY VENOUS (DVT) (7a-7p) Result Date: 01/09/2024  Lower Venous DVT Study Patient Name:  Leroy Simmons  Date of Exam:   01/09/2024 Medical Rec #: [id number]       Accession #:    [id number] Date of Birth: 05-16-1971       Patient Gender: M Patient Age:   70 years Exam Location:  [facility] Procedure:      VAS [place] LOWER EXTREMITY VENOUS (DVT) Referring Phys: Lorin Picket [place] --------------------------------------------------------------------------------  Indications: Swelling, and Edema.  Risk Factors: Obesity. Comparison Study: No changes seen since previous exam 04/18/23. Performing Technologist: Shona Simpson  Examination Guidelines: A complete evaluation includes B-mode imaging, spectral Doppler, color Doppler, and power Doppler as needed of all accessible portions of each vessel. Bilateral testing is considered an integral part of a complete examination. Limited examinations for reoccurring indications may be performed as noted. The reflux portion of the exam is performed with the patient in reverse Trendelenburg.  +---------+---------------+---------+-----------+----------+--------------+ RIGHT    CompressibilityPhasicitySpontaneityPropertiesThrombus Aging  +---------+---------------+---------+-----------+----------+--------------+ CFV      Full           Yes      Yes                                 +---------+---------------+---------+-----------+----------+--------------+  SFJ      Full                                                        +---------+---------------+---------+-----------+----------+--------------+ FV Prox  Full                                                        +---------+---------------+---------+-----------+----------+--------------+ FV Mid   Full                                                        +---------+---------------+---------+-----------+----------+--------------+ FV DistalFull                                                        +---------+---------------+---------+-----------+----------+--------------+ PFV      Full                                                        +---------+---------------+---------+-----------+----------+--------------+ POP      Full           Yes      Yes                                 +---------+---------------+---------+-----------+----------+--------------+ PTV      Full                                                        +---------+---------------+---------+-----------+----------+--------------+ PERO     Full                                                        +---------+---------------+---------+-----------+----------+--------------+   +---------+---------------+---------+-----------+----------+--------------+ LEFT     CompressibilityPhasicitySpontaneityPropertiesThrombus Aging +---------+---------------+---------+-----------+----------+--------------+ CFV      Full           Yes      Yes                                 +---------+---------------+---------+-----------+----------+--------------+ SFJ      Full                                                         +---------+---------------+---------+-----------+----------+--------------+  FV Prox  Full                                                        +---------+---------------+---------+-----------+----------+--------------+ FV Mid   Full                                                        +---------+---------------+---------+-----------+----------+--------------+ FV DistalFull                                                        +---------+---------------+---------+-----------+----------+--------------+ PFV      Full                                                        +---------+---------------+---------+-----------+----------+--------------+ POP      Full           Yes      Yes                                 +---------+---------------+---------+-----------+----------+--------------+ PTV      Full                                                        +---------+---------------+---------+-----------+----------+--------------+ PERO     Partial                                                     +---------+---------------+---------+-----------+----------+--------------+     Summary: BILATERAL: - No evidence of deep vein thrombosis seen in the lower extremities, bilaterally. -No evidence of popliteal cyst, bilaterally.   *See table(s) above for measurements and observations. Electronically signed by Lemar Livings MD on 01/09/2024 at 7:49:29 PM.    Final    CT CHEST WO CONTRAST Result Date: 01/09/2024 CLINICAL DATA:  Chest pain. EXAM: CT CHEST WITHOUT CONTRAST TECHNIQUE: Multidetector CT imaging of the chest was performed following the standard protocol without IV contrast. RADIATION DOSE REDUCTION: This exam was performed according to the departmental dose-optimization program which includes automated exposure control, adjustment of the mA and/or kV according to patient size and/or use of iterative reconstruction technique. COMPARISON:  Lung bases from  abdominopelvic CT earlier today. Chest CT 03/19/2023 FINDINGS: Occasion scanned in decubitus position. Cardiovascular: The heart is enlarged. There are coronary artery calcifications. Small pericardial effusion. Aortic atherosclerosis without aneurysm. Mediastinum/Nodes: Mild mediastinal adenopathy. 13 mm paratracheal node series 8, image 32. Hilar assessment is limited in the absence of IV contrast. Decompressed esophagus.  Lungs/Pleura: Diffuse and multifocal ground-glass and ground-glass nodularity throughout both lungs, with a basilar predominant distribution. Findings are progressed from CT earlier today. Mild associated smooth septal thickening. There is central bronchial thickening. No significant pleural effusion. Upper Abdomen: Assessed on abdominopelvic CT earlier today. Musculoskeletal: Dependent skin thickening with areas of subcutaneous edema, series 8, image 57 to the left of midline and series 8, image 11 to the right of midline. No obvious drainable collection in the absence of IV contrast. There are no acute or suspicious osseous abnormalities. IMPRESSION: 1. Diffuse and multifocal ground-glass and ground-glass nodularity throughout both lungs, with a basilar predominant distribution. Findings are progressed from CT earlier today, favor multifocal pneumonia. 2. Smooth septal thickening is suspicious for pulmonary edema. 3. Mild mediastinal adenopathy, likely reactive. 4. Cardiomegaly with coronary artery calcifications and small pericardial effusion. 5. Dependent skin thickening with areas of subcutaneous edema, may represent cellulitis. No obvious drainable collection in the absence of IV contrast. Aortic Atherosclerosis (ICD10-I70.0). Electronically Signed   By: Narda Rutherford M.D.   On: 01/09/2024 18:29   NM Pulmonary Perfusion Addendum Date: 01/09/2024 ADDENDUM REPORT: 01/09/2024 14:45 ADDENDUM: Critical Value/emergent results were called by telephone at the time of interpretation on 01/09/2024  at 2:45 pm to provider Pricilla Loveless , who verbally acknowledged these results. Electronically Signed   By: Signa Kell M.D.   On: 01/09/2024 14:45   Result Date: 01/09/2024 CLINICAL DATA:  Evaluate for acute pulmonary embolism. EXAM: NUCLEAR MEDICINE PERFUSION LUNG SCAN TECHNIQUE: Perfusion images were obtained in multiple projections after intravenous injection of radiopharmaceutical. Ventilation scans intentionally deferred if perfusion scan and chest x-ray adequate for interpretation during COVID 19 epidemic. RADIOPHARMACEUTICALS:  4.1 mCi Tc-65m MAA IV COMPARISON:  Chest radiograph from 01/09/2024 FINDINGS: There is a large perfusion defect identified within the lateral aspect of the right upper lobe without corresponding chest radiograph abnormality. No additional segmental perfusion defects identified. IMPRESSION: Examination is positive for single large segmental perfusion defect within the right upper lobe compatible with pulmonary embolism. Electronically Signed: By: Signa Kell M.D. On: 01/09/2024 14:28   CT ABDOMEN PELVIS WO CONTRAST Result Date: 01/09/2024 CLINICAL DATA:  Nausea, vomiting, right lower quadrant abdominal pain. EXAM: CT ABDOMEN AND PELVIS WITHOUT CONTRAST TECHNIQUE: Multidetector CT imaging of the abdomen and pelvis was performed following the standard protocol without IV contrast. RADIATION DOSE REDUCTION: This exam was performed according to the departmental dose-optimization program which includes automated exposure control, adjustment of the mA and/or kV according to patient size and/or use of iterative reconstruction technique. COMPARISON:  September 25, 2022. FINDINGS: Lower chest: Mild bibasilar opacities are noted concerning for possible pneumonia. Stable 7 mm left basilar nodule. This can be considered benign at this point with no further follow-up required. Hepatobiliary: Small gallstone is noted. No biliary dilatation. Liver is unremarkable. Pancreas: Unremarkable. No  pancreatic ductal dilatation or surrounding inflammatory changes. Spleen: Normal in size without focal abnormality. Adrenals/Urinary Tract: Adrenal glands are unremarkable. Kidneys are normal, without renal calculi, focal lesion, or hydronephrosis. Bladder is unremarkable. Stomach/Bowel: Stomach is unremarkable. Status post appendectomy. There is no evidence of bowel obstruction or inflammation. Vascular/Lymphatic: No significant vascular findings are present. No enlarged abdominal or pelvic lymph nodes. Reproductive: Prostate is unremarkable. Other: No ascites or hernia. Musculoskeletal: No acute or significant osseous findings. IMPRESSION: Mild bibasilar opacities are noted in visualized lung bases concerning for possible pneumonia. Small gallstone. No other significant abnormality seen in the abdomen or pelvis. Electronically Signed   By: Lupita Raider  M.D.   On: 01/09/2024 10:55   DG Chest 2 View Result Date: 01/09/2024 CLINICAL DATA:  Infection. EXAM: CHEST - 2 VIEW COMPARISON:  09/13/2023 FINDINGS: Cardiac enlargement. No pleural fluid. Pulmonary vascular congestion with increased interstitial markings are noted bilaterally. No airspace consolidation identified. Visualized osseous structures are unremarkable. IMPRESSION: 1. Cardiac enlargement and pulmonary vascular congestion. 2. Increased interstitial markings bilaterally may reflect interstitial edema or atypical infection. Electronically Signed   By: Signa Kell M.D.   On: 01/09/2024 06:49        Scheduled Meds:  amLODipine  10 mg Oral Daily   [START ON 01/11/2024] fluticasone  2 spray Each Nare Daily   furosemide  40 mg Intravenous Q12H   guaiFENesin  1,200 mg Oral BID   insulin aspart  0-9 Units Subcutaneous TID WC   insulin glargine-yfgn  20 Units Subcutaneous BID   loratadine  10 mg Oral Daily   pantoprazole (PROTONIX) IV  40 mg Intravenous Q12H   sodium chloride flush  3 mL Intravenous Q12H   sodium chloride flush  3-10 mL  Intravenous Q12H   sucralfate  1 g Oral TID WC & HS   thiamine (VITAMIN B1) injection  100 mg Intravenous Daily   Continuous Infusions:  heparin 1,600 Units/hr (01/10/24 1315)   piperacillin-tazobactam (ZOSYN)  IV 3.375 g (01/10/24 1352)   [START ON 01/11/2024] vancomycin       LOS: 0 days    Time spent: 40 mins    Ramiro Harvest, MD [facility]   To contact the attending provider between 7A-7P or the covering provider during after hours 7P-7A, please log into the web site [email or web address] and access using universal [place] password for that web site. If you do not have the password, please call the hospital operator.  01/10/2024, 3:54 PM

## 2024-01-10 NOTE — Care Management CC44 (Deleted)
 Condition Code 44 Documentation Completed  Patient Details  Name: Leroy Simmons MRN: [id number] Date of Birth: 1971-09-07   Condition Code 44 given:    Patient signature on Condition Code 44 notice:    Documentation of 2 MD's agreement:    Code 44 added to claim:       Oletta Cohn, RN 01/10/2024, 11:52 AM

## 2024-01-10 NOTE — Plan of Care (Signed)

## 2024-01-10 NOTE — Progress Notes (Signed)
 Transition of Care [facility]) - Inpatient Brief Assessment   Patient Details  Name: Leroy Simmons MRN: [id number] Date of Birth: 10-31-1970  Transition of Care [facility]) CM/SW Contact:    Oletta Cohn, RN Phone Number: 01/10/2024, 11:55 AM   Clinical Narrative: RNCM following for discharge needs.  RNCM met with pt at bedside regarding discharge needs.  Pt states he has roommates and has transportation to get where he needs to go.  Pt visits [facility] for PCP needs and states he will make follow-up appointment when he is discharged.   [name], RN, BSN, [place] [phone]    Transition of Care Asessment: Insurance and Status: (P) Insurance coverage has been reviewed Patient has primary care physician: (P) Yes Home environment has been reviewed: (P) home with roommates Prior level of function:: (P) independent Prior/Current Home Services: (P) No current home services Social Drivers of Health Review: (P) SDOH reviewed no interventions necessary Readmission risk has been reviewed: (P) Yes Transition of care needs: (P) no transition of care needs at this time

## 2024-01-10 NOTE — Progress Notes (Signed)
 PHARMACY - ANTICOAGULATION CONSULT NOTE  Pharmacy Consult for heparin Indication: pulmonary embolus  No Known Allergies  Patient Measurements: Height: 5\' 8"  (172.7 cm) Weight: 114.8 kg (253 lb) IBW/kg (Calculated) : 68.4 HEPARIN DW (KG): 94.3  Vital Signs: Temp: 99.7 F (37.6 C) (04/09 0716) Temp Source: Oral (04/09 0716) BP: 144/82 (04/09 0700) Pulse Rate: 107 (04/09 0700)  Labs: Recent Labs    01/09/24 0534 01/09/24 0948 01/09/24 1315 01/09/24 1524 01/09/24 2100 01/10/24 0508  HGB 13.9  --   --   --   --  12.3*  HCT 42.0  --   --   --   --  37.2*  PLT 246  --   --   --   --  220  HEPARINUNFRC  --   --   --   --  0.52 0.54  CREATININE 2.62*  --   --   --   --  3.03*  TROPONINIHS 164* 174* 151* 168*  --   --     Estimated Creatinine Clearance: 35.9 mL/min (A) (by C-G formula based on SCr of 3.03 mg/dL (H)).  Assessment: 52 YOM presenting with HA/ cough and SOB, VQ scan positive for large segmental perfusion defect, he is not on anticoagulation PTA, CBC wnl. Heparin level remains therapeutic. No bleeding noted.   Goal of Therapy:  Heparin level 0.3-0.7 units/ml Monitor platelets by anticoagulation protocol: Yes   Plan:  Continue heparin gtt 1600 units/hr Daily heparin level and CBC  Lysle Pearl, PharmD, BCPS, BCEMP Clinical Pharmacist Please see AMION for all pharmacy numbers 01/10/2024 7:41 AM

## 2024-01-10 NOTE — Care Management Obs Status (Deleted)
 MEDICARE OBSERVATION STATUS NOTIFICATION   Patient Details  Name: Leroy Simmons MRN: [id number] Date of Birth: [birth year]   Medicare Observation Status Notification Given:  Yes    Oletta Cohn, RN 01/10/2024, 11:53 AM

## 2024-01-11 ENCOUNTER — Inpatient Hospital Stay (HOSPITAL_COMMUNITY)

## 2024-01-11 DIAGNOSIS — I2699 Other pulmonary embolism without acute cor pulmonale: Secondary | ICD-10-CM | POA: Diagnosis not present

## 2024-01-11 DIAGNOSIS — D638 Anemia in other chronic diseases classified elsewhere: Secondary | ICD-10-CM | POA: Diagnosis not present

## 2024-01-11 DIAGNOSIS — I5033 Acute on chronic diastolic (congestive) heart failure: Secondary | ICD-10-CM | POA: Diagnosis not present

## 2024-01-11 DIAGNOSIS — N179 Acute kidney failure, unspecified: Secondary | ICD-10-CM | POA: Diagnosis not present

## 2024-01-11 LAB — GLUCOSE, CAPILLARY
Glucose-Capillary: 104 mg/dL — ABNORMAL HIGH (ref 70–99)
Glucose-Capillary: 139 mg/dL — ABNORMAL HIGH (ref 70–99)
Glucose-Capillary: 101 mg/dL — ABNORMAL HIGH (ref 70–99)
Glucose-Capillary: 125 mg/dL — ABNORMAL HIGH (ref 70–99)

## 2024-01-11 LAB — URINALYSIS, COMPLETE (UACMP) WITH MICROSCOPIC
Bilirubin Urine: NEGATIVE
Glucose, UA: 150 mg/dL — AB
Ketones, ur: NEGATIVE mg/dL
Leukocytes,Ua: NEGATIVE
Nitrite: NEGATIVE
Protein, ur: >=300 mg/dL — AB
Specific Gravity, Urine: 1.015 (ref 1.005–1.030)
pH: 5 (ref 5.0–8.0)

## 2024-01-11 LAB — HEMOGLOBIN A1C
Hgb A1c MFr Bld: 9.3 % — ABNORMAL HIGH (ref 4.8–5.6)
Mean Plasma Glucose: 220.21 mg/dL

## 2024-01-11 LAB — RENAL FUNCTION PANEL
Albumin: 2 g/dL — ABNORMAL LOW (ref 3.5–5.0)
Anion gap: 15 (ref 5–15)
BUN: 46 mg/dL — ABNORMAL HIGH (ref 6–20)
CO2: 17 mmol/L — ABNORMAL LOW (ref 22–32)
Calcium: 7.9 mg/dL — ABNORMAL LOW (ref 8.9–10.3)
Chloride: 102 mmol/L (ref 98–111)
Creatinine, Ser: 3.79 mg/dL — ABNORMAL HIGH (ref 0.61–1.24)
GFR, Estimated: 19 mL/min — ABNORMAL LOW (ref >60)
Glucose, Bld: 150 mg/dL — ABNORMAL HIGH (ref 70–99)
Phosphorus: 3.5 mg/dL (ref 2.5–4.6)
Potassium: 3.9 mmol/L (ref 3.5–5.1)
Sodium: 134 mmol/L — ABNORMAL LOW (ref 135–145)

## 2024-01-11 LAB — OCCULT BLOOD X 1 CARD TO LAB, STOOL: Fecal Occult Bld: POSITIVE — AB

## 2024-01-11 LAB — PROTEIN / CREATININE RATIO, URINE
Creatinine, Urine: 51 mg/dL
Protein Creatinine Ratio: 9.96 mg/mg{creat} — ABNORMAL HIGH (ref 0.00–0.15)
Total Protein, Urine: 508 mg/dL

## 2024-01-11 LAB — MRSA NEXT GEN BY PCR, NASAL: MRSA by PCR Next Gen: NOT DETECTED

## 2024-01-11 LAB — CBC
HCT: 29.2 % — ABNORMAL LOW (ref 39.0–52.0)
Hemoglobin: 10 g/dL — ABNORMAL LOW (ref 13.0–17.0)
MCH: 29.9 pg (ref 26.0–34.0)
MCHC: 34.2 g/dL (ref 30.0–36.0)
MCV: 87.4 fL (ref 80.0–100.0)
Platelets: 174 10*3/uL (ref 150–400)
RBC: 3.34 MIL/uL — ABNORMAL LOW (ref 4.22–5.81)
RDW: 12.5 % (ref 11.5–15.5)
WBC: 6.8 10*3/uL (ref 4.0–10.5)
nRBC: 0 % (ref 0.0–0.2)

## 2024-01-11 LAB — HEPARIN LEVEL (UNFRACTIONATED): Heparin Unfractionated: 0.34 [IU]/mL (ref 0.30–0.70)

## 2024-01-11 LAB — SODIUM, URINE, RANDOM: Sodium, Ur: 57 mmol/L

## 2024-01-11 LAB — CREATININE, URINE, RANDOM: Creatinine, Urine: 64 mg/dL

## 2024-01-11 LAB — HIV ANTIBODY (ROUTINE TESTING W REFLEX): HIV Screen 4th Generation wRfx: NONREACTIVE

## 2024-01-11 MED ORDER — FUROSEMIDE 10 MG/ML IJ SOLN
40.0000 mg | Freq: Two times a day (BID) | INTRAMUSCULAR | Status: DC
  Administered 2024-01-11: 40 mg via INTRAVENOUS
  Filled 2024-01-11: qty 4

## 2024-01-11 MED ORDER — LOPERAMIDE HCL 2 MG PO CAPS: 2.0000 mg | ORAL_CAPSULE | Freq: Once | ORAL | Status: DC

## 2024-01-11 MED ORDER — SODIUM CHLORIDE 0.9 % IV SOLN
2.0000 g | Freq: Every day | INTRAVENOUS | Status: DC
  Administered 2024-01-11 – 2024-01-15 (×5): 2 g via INTRAVENOUS
  Filled 2024-01-11 (×5): qty 20

## 2024-01-11 MED ORDER — SODIUM BICARBONATE 650 MG PO TABS
650.0000 mg | ORAL_TABLET | Freq: Two times a day (BID) | ORAL | Status: DC
  Administered 2024-01-11 – 2024-01-19 (×17): 650 mg via ORAL
  Filled 2024-01-11 (×17): qty 1

## 2024-01-11 MED ORDER — SODIUM CHLORIDE 0.9 % IV SOLN
500.0000 mg | Freq: Every day | INTRAVENOUS | Status: DC
  Administered 2024-01-11 – 2024-01-12 (×2): 500 mg via INTRAVENOUS
  Filled 2024-01-11 (×2): qty 5

## 2024-01-11 MED ORDER — LIVING WELL WITH DIABETES BOOK
Freq: Once | Status: AC
  Filled 2024-01-11: qty 1

## 2024-01-11 MED ORDER — FUROSEMIDE 10 MG/ML IJ SOLN
80.0000 mg | Freq: Two times a day (BID) | INTRAMUSCULAR | Status: DC
  Administered 2024-01-12 – 2024-01-13 (×5): 80 mg via INTRAVENOUS
  Filled 2024-01-11 (×5): qty 8

## 2024-01-11 NOTE — Progress Notes (Signed)
 Mobility Specialist Progress Note:    01/11/24 1023  Mobility  Activity Stood at bedside  Level of Assistance Contact guard assist, steadying assist  Assistive Device None  Activity Response Tolerated fair  Mobility Referral Yes  Mobility visit 1 Mobility  Mobility Specialist Start Time (ACUTE ONLY) 1004  Mobility Specialist Stop Time (ACUTE ONLY) 1023  Mobility Specialist Time Calculation (min) (ACUTE ONLY) 19 min   Pt received dangling EOB, agreeable to mobility session after peri care. Pt had stool incontinence earlier during sleep, awaiting assistance for linen change. At rest, SpO2 86% on RA, increased O2 flow, SpO2 91% on 2L. Pt stood at bedside, SpO2 85% on 3L. Increased O2 flow, SpO2 91% on 6L. Pt reported feeling "loopy" and opted to sit back down. BP 167/98. Left pt dangling EOB with all needs met, BSC and table close by. RN in room.  Feliciana Rossetti Mobility Specialist Please contact via Special educational needs teacher or  Rehab office at [phone]

## 2024-01-11 NOTE — Progress Notes (Signed)
 Chaplain responded to a consult request for Advance Directive education. Mr Kraeger stated that he was not feeling well at all, but still wanted to receive AD education. (Noted below is the education that was provided for further reference.) He would like to continue to review the documents and will contact spiritual care when ready to complete or needs more information.  Chaplain provided the Advance Directive packet as well as education on Advance Directives-documents an individual completes to communicate their health care directions in advance of a time when they may need them. Chaplain informed pt the documents which may be completed here in the hospital are the Living Will and Health Care Power of [place].   Chaplain informed that the Health Care Power of [place] is a legal document in which an individual names another person, their Health Care Agent, to make health care decisions when the individual is not able to make them for themselves. The Health Care Agent's function can be temporary or permanent depending on the pt's ability to make and communicate those decisions independently. Chaplain informed pt in the absence of a Health Care Power of [place], the state of [place] directs health care providers to look to the following individuals in the order listed: legal guardian; an attorney?in?fact under a general power of attorney (POA) if that POA includes the right to make health care decisions; a husband or wife; a majority of parents and adult children; a majority of adult brothers and sisters; or an individual who has an established relationship with you, who is acting in good faith and who can convey your wishes.  If none of these person are available or willing to make medical decisions on a patient's behalf, the law allows the patient's doctor to make decisions for them as long as another doctor agrees with those decisions.  Chaplain also informed the patient that the Health Care agent has  no decision-making authority over any affairs other than those related to his or her medical care.   The chaplain further educated the pt that a Living Will is a legal document that allows an individual to state his or her desire not to receive life-prolonging measures in the event that they have a condition that is incurable and will result in their death in a short period of time; they are unconscious, and doctors are confident that they will not regain consciousness; and/or they have advanced dementia or other substantial and irreversible loss of mental function. The chaplain informed pt that life-prolonging measures are medical treatments that would only serve to postpone death, including breathing machines, kidney dialysis, antibiotics, artificial nutrition and hydration (tube feeding), and similar forms of treatment and that if an individual is able to express their wishes, they may also make them known without the use of a Living Will, but in the event that an individual is not able to express their wishes themselves, a Living Will allows medical providers and the pt's family and friends ensure that they are not making decisions on the pt's behalf, but rather serving as the pt's voice to convey decisions the pt has already made.   The patient is aware that the decision to create an advance directive is theirs alone and they may chose not to complete the documents or may chose to complete one portion or both.  The patient was informed that they can revoke the documents at any time by striking through them and writing void or by completing new documents, but that it is also  advisable that the individual verbally notify interested parties that their wishes have changed.  They are also aware that the document must be signed in the presence of a notary public and two witnesses and that this can be done while the patient is still admitted to the hospital or after discharge in the community. If they decide to  complete Advance Directives after being discharged from the hospital, they have been advised to notify all interested parties and to provide those documents to their physicians and loved ones in addition to bringing them to the hospital in the event of another hospitalization.   The chaplain informed the pt that if they desire to proceed with completing Advance Directive Documentation while they are still admitted, notary services are typically available at [facility] between the hours of 1:00 and 3:30 Monday-Thursday.    When the patient is ready to have these documents completed, the patient should request that their nurse place a spiritual care consult and indicate that the patient is ready to have their advance directives notarized so that arrangements for witnesses and notary public can be made.  Please page spiritual care if the patient desires further education or has questions.      Maryanna Shape. Carley Hammed, M.Div. [facility] Chaplain Pager [phone] Office [phone]     01/11/24 1128  Spiritual Encounters  Type of Visit Initial  Care provided to: Patient  Referral source Patient request  Reason for visit Advance directives  Advance Directives (For Healthcare)  Does Patient Have a Medical Advance Directive? No  Would patient like information on creating a medical advance directive? Yes (Inpatient - patient defers creating a medical advance directive at this time - Information given)

## 2024-01-11 NOTE — Progress Notes (Signed)
 PHARMACY - ANTICOAGULATION CONSULT NOTE  Pharmacy Consult for heparin Indication: pulmonary embolus  No Known Allergies  Patient Measurements: Height: 5\' 8"  (172.7 cm) Weight: 112.1 kg (247 lb 1.6 oz) IBW/kg (Calculated) : 68.4 HEPARIN DW (KG): 94.3  Vital Signs: Temp: 98.1 F (36.7 C) (04/10 0752) Temp Source: Oral (04/10 0752) BP: 147/74 (04/10 0833) Pulse Rate: 110 (04/10 0752)  Labs: Recent Labs    01/09/24 0534 01/09/24 0948 01/09/24 1315 01/09/24 1524 01/09/24 2100 01/10/24 0508 01/11/24 0429  HGB 13.9  --   --   --   --  12.3* 10.0*  HCT 42.0  --   --   --   --  37.2* 29.2*  PLT 246  --   --   --   --  220 174  HEPARINUNFRC  --   --   --   --  0.52 0.54 0.34  CREATININE 2.62*  --   --   --   --  3.03* 3.79*  TROPONINIHS 164* 174* 151* 168*  --   --   --     Estimated Creatinine Clearance: 28.3 mL/min (A) (by C-G formula based on SCr of 3.79 mg/dL (H)).  Assessment: 52 YOM presenting with HA/ cough and SOB, VQ scan positive for large segmental perfusion defect, he is not on anticoagulation PTA, CBC wnl. Heparin level therapeutic though at the lower end of range. No bleeding noted.   Goal of Therapy:  Heparin level 0.3-0.7 units/ml Monitor platelets by anticoagulation protocol: Yes   Plan:  Empiric increase heparin infusion to 1650 units/hr Check heparin level daily while on heparin Continue to monitor H&H and platelets F/u transition to oral AC when able  Thank you for allowing pharmacy to be a part of this patient's care.  Thelma Barge, PharmD, BCPS Clinical Pharmacist

## 2024-01-11 NOTE — Consult Note (Signed)
 Leroy Simmons  HISTORY AND PHYSICAL  Leroy Simmons is an 53 y.o. male.    Chief Complaint: SOB and LE edema  HPI: Pt is a 56 M with a PMH sig for HTN, HLD, DM II (poorly controlled), CHF, anxiety, h/o DVT, and h/o recurrent MRSA abscesses who is seen at the request of Dr Janee Morn for AKI on CKD IV.    Pt was admitted 01/09/24 with leg swelling and SOB.  Was found to have a Large segmental PE on V/Q scan.  Placed on heparin.  Diuresed with Lasix.  Of note, didn't get any contrasted studies it appears.  TTE 01/10/24 with EF 60-65%, no R heart strain.  CXR showing pulm vascular congestion.  On CAP coverage.    Baseline Cr appears to be 2.2-2.4.  Cr on admission 2.6--> 3.03-->3.79.  In this setting we are asked to see.    Pt is on O2- desatting when we walked in, O2 increased from 5-6L--> O2 returned to normal.      PMH: Past Medical History:  Diagnosis Date   Acute gangrenous appendicitis 09/25/2022   Anxiety    CHF (congestive heart failure) (HCC)    Coronary artery disease    Depression    Diabetes mellitus without complication (HCC)    Hypertension    PSH: Past Surgical History:  Procedure Laterality Date   INCISION AND DRAINAGE ABSCESS N/A 09/15/2023   Procedure: INCISION AND DRAINAGE OF BACK ABSCESS;  Surgeon: Manus Rudd, MD;  Location: [facility] OR;  Service: General;  Laterality: N/A;   LAPAROSCOPIC APPENDECTOMY N/A 09/26/2022   Procedure: APPENDECTOMY LAPAROSCOPIC;  Surgeon: Campbell Lerner, MD;  Location: [facility] ORS;  Service: General;  Laterality: N/A;   LEFT HEART CATH AND CORONARY ANGIOGRAPHY N/A 03/21/2022   Procedure: LEFT HEART CATH AND CORONARY ANGIOGRAPHY;  Surgeon: Corky Crafts, MD;  Location: [facility] INV[facility];  Service: Cardiovascular;  Laterality: N/A;     Past Medical History:  Diagnosis Date   Acute gangrenous appendicitis 09/25/2022   Anxiety    CHF (congestive heart failure) (HCC)    Coronary artery disease    Depression    Diabetes  mellitus without complication (HCC)    Hypertension     Medications:  Scheduled:  amLODipine  10 mg Oral Daily   fluticasone  2 spray Each Nare Daily   furosemide  40 mg Intravenous Q12H   guaiFENesin  1,200 mg Oral BID   insulin aspart  0-9 Units Subcutaneous TID WC   insulin glargine-yfgn  20 Units Subcutaneous BID   loperamide  2 mg Oral Once   loratadine  10 mg Oral Daily   pantoprazole (PROTONIX) IV  40 mg Intravenous Q12H   sodium bicarbonate  650 mg Oral BID   sodium chloride flush  3 mL Intravenous Q12H   sodium chloride flush  3-10 mL Intravenous Q12H   sucralfate  1 g Oral TID WC & HS   thiamine (VITAMIN B1) injection  100 mg Intravenous Daily    Medications Prior to Admission  Medication Sig Dispense Refill   albuterol (VENTOLIN HFA) 108 (90 Base) MCG/ACT inhaler Inhale 2 puffs into the lungs every 6 (six) hours as needed for wheezing or shortness of breath. 8 g 2   chlorhexidine (HIBICLENS) 4 % external liquid Apply topically daily as needed. 236 mL 0   insulin aspart (NOVOLOG) 100 UNIT/ML FlexPen Inject 5 Units into the skin 3 (three) times daily before meals. 87 mL 0   Insulin Glargine  Solostar (LANTUS) 100 UNIT/ML Solostar Pen Inject 45 Units into the skin 2 (two) times daily. 30 mL 0   lisinopril (ZESTRIL) 10 MG tablet Take 2 tablets (20 mg total) by mouth daily. 30 tablet 0   polyethylene glycol powder (GLYCOLAX/MIRALAX) 17 GM/SCOOP powder Take 17 g by mouth 2 (two) times daily. (Patient taking differently: Take 17 g by mouth daily as needed for severe constipation.) 238 g 0   senna-docusate (SENOKOT-S) 8.6-50 MG tablet Take 1 tablet by mouth 2 (two) times daily. (Patient taking differently: Take 1 tablet by mouth daily as needed for moderate constipation.) 60 tablet 0   torsemide (DEMADEX) 20 MG tablet Take 2 tablets (40 mg total) by mouth 2 (two) times daily. 120 tablet 5   amLODipine (NORVASC) 10 MG tablet Take 1 tablet (10 mg total) by mouth daily. (Patient not  taking: Reported on 01/09/2024) 30 tablet 3   blood glucose meter kit and supplies Dispense based on patient and insurance preference. Use up to four times daily as directed. (FOR ICD-10 E10.9, E11.9). 1 each 0   doxycycline (VIBRA-TABS) 100 MG tablet Take 1 tablet (100 mg total) by mouth every 12 (twelve) hours. (Patient not taking: Reported on 01/09/2024) 28 tablet 0    ALLERGIES:  No Known Allergies  FAM HX: Family History  Problem Relation Age of Onset   Heart failure Mother     Social History:   reports that he has never smoked. He has never used smokeless tobacco. He reports current alcohol use. He reports that he does not use drugs.  ROS: ROS  Blood pressure (!) 158/93, pulse (!) 102, temperature 98.4 F (36.9 C), temperature source Oral, resp. rate (!) 30, height 5\' 8"  (1.727 m), weight 112.1 kg, SpO2 94%. PHYSICAL EXAM: Physical Exam   Results for orders placed or performed during the hospital encounter of 01/09/24 (from the past 48 hours)  Troponin I (High Sensitivity)     Status: Abnormal   Collection Time: 01/09/24  3:24 PM  Result Value Ref Range   Troponin I (High Sensitivity) 168 (HH) <18 ng/L    Comment: CRITICAL VALUE NOTED.  VALUE IS CONSISTENT WITH PREVIOUSLY REPORTED AND CALLED VALUE. (NOTE) Elevated high sensitivity troponin I (hsTnI) values and significant  changes across serial measurements may suggest ACS but many other  chronic and acute conditions are known to elevate hsTnI results.  Refer to the "Links" section for chest pain algorithms and additional  guidance. Performed at [facility], [address] [place], [place] [address]   Urinalysis, w/ Reflex to Culture (Infection Suspected) -Urine, Clean Catch     Status: Abnormal   Collection Time: 01/09/24  5:03 PM  Result Value Ref Range   Specimen Source URINE, CLEAN CATCH    Color, Urine YELLOW YELLOW   APPearance HAZY (A) CLEAR   Specific Gravity, Urine 1.012 1.005 - 1.030   pH 5.0 5.0 -  8.0   Glucose, UA 150 (A) NEGATIVE mg/dL   Hgb urine dipstick MODERATE (A) NEGATIVE   Bilirubin Urine NEGATIVE NEGATIVE   Ketones, ur NEGATIVE NEGATIVE mg/dL   Protein, ur >=478 (A) NEGATIVE mg/dL   Nitrite NEGATIVE NEGATIVE   Leukocytes,Ua NEGATIVE NEGATIVE   RBC / HPF 0-5 0 - 5 RBC/hpf   WBC, UA 0-5 0 - 5 WBC/hpf    Comment:        Reflex urine culture not performed if WBC <=10, OR if Squamous epithelial cells >5. If Squamous epithelial cells >5 suggest  recollection.    Bacteria, UA RARE (A) NONE SEEN   Squamous Epithelial / HPF 0-5 0 - 5 /HPF   Mucus PRESENT    Granular Casts, UA PRESENT     Comment: Performed at [facility], [address] [place], [place] [address]  Gastrointestinal Panel by PCR , Stool     Status: None   Collection Time: 01/09/24  5:03 PM   Specimen: Stool  Result Value Ref Range   Campylobacter species NOT DETECTED NOT DETECTED   Plesimonas shigelloides NOT DETECTED NOT DETECTED   Salmonella species NOT DETECTED NOT DETECTED   Yersinia enterocolitica NOT DETECTED NOT DETECTED   Vibrio species NOT DETECTED NOT DETECTED   Vibrio cholerae NOT DETECTED NOT DETECTED   Enteroaggregative E coli (EAEC) NOT DETECTED NOT DETECTED   Enteropathogenic E coli (EPEC) NOT DETECTED NOT DETECTED   Enterotoxigenic E coli (ETEC) NOT DETECTED NOT DETECTED   Shiga like toxin producing E coli (STEC) NOT DETECTED NOT DETECTED   Shigella/Enteroinvasive E coli (EIEC) NOT DETECTED NOT DETECTED   Cryptosporidium NOT DETECTED NOT DETECTED   Cyclospora cayetanensis NOT DETECTED NOT DETECTED   Entamoeba histolytica NOT DETECTED NOT DETECTED   Giardia lamblia NOT DETECTED NOT DETECTED   Adenovirus F40/41 NOT DETECTED NOT DETECTED   Astrovirus NOT DETECTED NOT DETECTED   Norovirus GI/GII NOT DETECTED NOT DETECTED   Rotavirus A NOT DETECTED NOT DETECTED   Sapovirus (I, II, IV, and V) NOT DETECTED NOT DETECTED    Comment: Performed at [facility], [address] [place], [place] [address]  C Difficile Quick Screen w PCR reflex     Status: None   Collection Time: 01/09/24  5:03 PM   Specimen: Stool  Result Value Ref Range   C Diff antigen NEGATIVE NEGATIVE   C Diff toxin NEGATIVE NEGATIVE   C Diff interpretation No C. difficile detected.     Comment: Performed at [facility], [address], [place], [place] [address]  Heparin level (unfractionated)     Status: None   Collection Time: 01/09/24  9:00 PM  Result Value Ref Range   Heparin Unfractionated 0.52 0.30 - 0.70 IU/mL    Comment: (NOTE) The clinical reportable range upper limit is being lowered to >1.10 to align with the FDA approved guidance for the current laboratory assay.  If heparin results are below expected values, and patient dosage has  been confirmed, suggest follow up testing of antithrombin III levels. Performed at [facility], [address], [place], [place] [address]   Heparin level (unfractionated)     Status: None   Collection Time: 01/10/24  5:08 AM  Result Value Ref Range   Heparin Unfractionated 0.54 0.30 - 0.70 IU/mL    Comment: (NOTE) The clinical reportable range upper limit is being lowered to >1.10 to align with the FDA approved guidance for the current laboratory assay.  If heparin results are below expected values, and patient dosage has  been confirmed, suggest follow up testing of antithrombin III levels. Performed at [facility], [address], [place], [place] [address]   CBC     Status: Abnormal   Collection Time: 01/10/24  5:08 AM  Result Value Ref Range   WBC 6.3 4.0 - 10.5 K/uL   RBC 4.22 4.22 - 5.81 MIL/uL   Hemoglobin 12.3 (L) 13.0 - 17.0 g/dL   HCT 65.7 (L) 84.6 - 96.2 %   MCV 88.2 80.0 - 100.0  fL   MCH 29.1 26.0 - 34.0 pg   MCHC 33.1 30.0 - 36.0 g/dL   RDW 64.4 03.4 - 74.2 %   Platelets 220 150 - 400 K/uL   nRBC 0.0 0.0 - 0.2 %    Comment: Performed at [facility], [address], [place], [place]  [address]  Comprehensive metabolic panel     Status: Abnormal   Collection Time: 01/10/24  5:08 AM  Result Value Ref Range   Sodium 134 (L) 135 - 145 mmol/L   Potassium 3.5 3.5 - 5.1 mmol/L   Chloride 104 98 - 111 mmol/L   CO2 19 (L) 22 - 32 mmol/L   Glucose, Bld 129 (H) 70 - 99 mg/dL    Comment: Glucose reference range applies only to samples taken after fasting for at least 8 hours.   BUN 39 (H) 6 - 20 mg/dL   Creatinine, Ser 8.75 (H) 0.61 - 1.24 mg/dL   Calcium 7.7 (L) 8.9 - 10.3 mg/dL   Total Protein 5.4 (L) 6.5 - 8.1 g/dL   Albumin 1.7 (L) 3.5 - 5.0 g/dL   AST 39 15 - 41 U/L   ALT 28 0 - 44 U/L   Alkaline Phosphatase 92 38 - 126 U/L   Total Bilirubin 0.9 0.0 - 1.2 mg/dL   GFR, Estimated 24 (L) >60 mL/min    Comment: (NOTE) Calculated using the CKD-EPI Creatinine Equation (2021)    Anion gap 11 5 - 15    Comment: Performed at [facility], [address] [place], [place] [address]  CBG monitoring, ED     Status: Abnormal   Collection Time: 01/10/24 11:42 AM  Result Value Ref Range   Glucose-Capillary 135 (H) 70 - 99 mg/dL    Comment: Glucose reference range applies only to samples taken after fasting for at least 8 hours.  CBG monitoring, ED     Status: Abnormal   Collection Time: 01/10/24 12:00 PM  Result Value Ref Range   Glucose-Capillary 149 (H) 70 - 99 mg/dL    Comment: Glucose reference range applies only to samples taken after fasting for at least 8 hours.  Glucose, capillary     Status: Abnormal   Collection Time: 01/10/24  5:07 PM  Result Value Ref Range   Glucose-Capillary 125 (H) 70 - 99 mg/dL    Comment: Glucose reference range applies only to samples taken after fasting for at least 8 hours.  HIV Antibody (routine testing w rflx)     Status: None   Collection Time: 01/10/24  7:03 PM  Result Value Ref Range   HIV Screen 4th Generation wRfx Non Reactive Non Reactive    Comment: Performed at [facility], [address] [place], [place] [address]   Glucose, capillary     Status: Abnormal   Collection Time: 01/10/24  8:58 PM  Result Value Ref Range   Glucose-Capillary 188 (H) 70 - 99 mg/dL    Comment: Glucose reference range applies only to samples taken after fasting for at least 8 hours.  Heparin level (unfractionated)     Status: None   Collection Time: 01/11/24  4:29 AM  Result Value Ref Range   Heparin Unfractionated 0.34 0.30 - 0.70 IU/mL    Comment: (NOTE) The clinical reportable range upper limit is being lowered to >1.10 to align with the FDA approved guidance for the current laboratory assay.  If heparin results are below expected values, and patient dosage has  been confirmed, suggest follow up testing of antithrombin III levels. Performed at [facility], [address] [place], [place] [address]   CBC     Status: Abnormal   Collection Time: 01/11/24  4:29 AM  Result Value Ref Range   WBC 6.8 4.0 - 10.5 K/uL   RBC 3.34 (L) 4.22 - 5.81 MIL/uL   Hemoglobin 10.0 (L) 13.0 - 17.0 g/dL   HCT 13.0 (L) 86.5 - 78.4 %   MCV 87.4 80.0 - 100.0 fL   MCH 29.9 26.0 - 34.0 pg   MCHC 34.2 30.0 - 36.0 g/dL   RDW 69.6 29.5 - 28.4 %   Platelets 174 150 - 400 K/uL   nRBC 0.0 0.0 - 0.2 %    Comment: Performed at [facility], [address] [place], [place] [address]  Hemoglobin A1c     Status: Abnormal   Collection Time: 01/11/24  4:29 AM  Result Value Ref Range   Hgb A1c MFr Bld 9.3 (H) 4.8 - 5.6 %    Comment: (NOTE) Pre diabetes:          5.7%-6.4%  Diabetes:              >6.4%  Glycemic control for   <7.0% adults with diabetes    Mean Plasma Glucose 220.21 mg/dL    Comment: Performed at [facility], [address], [place], [place] [address]  Renal function panel     Status: Abnormal   Collection Time: 01/11/24  4:29 AM  Result Value Ref Range   Sodium 134 (L) 135 - 145 mmol/L   Potassium 3.9 3.5 - 5.1 mmol/L   Chloride 102 98 - 111 mmol/L   CO2 17 (L) 22 - 32 mmol/L   Glucose, Bld 150 (H)  70 - 99 mg/dL    Comment: Glucose reference range applies only to samples taken after fasting for at least 8 hours.   BUN 46 (H) 6 - 20 mg/dL   Creatinine, Ser 2.53 (H) 0.61 - 1.24 mg/dL   Calcium 7.9 (L) 8.9 - 10.3 mg/dL   Phosphorus 3.5 2.5 - 4.6 mg/dL   Albumin 2.0 (L) 3.5 - 5.0 g/dL   GFR, Estimated 19 (L) >60 mL/min    Comment: (NOTE) Calculated using the CKD-EPI Creatinine Equation (2021)    Anion gap 15 5 - 15    Comment: Performed at [facility], [address] [place], [place] [address]  HIV Antibody (routine testing w rflx)     Status: None   Collection Time: 01/11/24  4:29 AM  Result Value Ref Range   HIV Screen 4th Generation wRfx Non Reactive Non Reactive    Comment: Performed at [facility], [address] [place], [place] [address]  Glucose, capillary     Status: Abnormal   Collection Time: 01/11/24  5:55 AM  Result Value Ref Range   Glucose-Capillary 104 (H) 70 - 99 mg/dL    Comment: Glucose reference range applies only to samples taken after fasting for at least 8 hours.   Comment 1 QC Due   Glucose, capillary     Status: Abnormal   Collection Time: 01/11/24  8:31 AM  Result Value Ref Range   Glucose-Capillary 139 (H) 70 - 99 mg/dL    Comment: Glucose reference range applies only to samples taken after fasting for at least 8 hours.  MRSA Next Gen by PCR, Nasal     Status: None  Collection Time: 01/11/24  8:47 AM   Specimen: Nasal Mucosa; Nasal Swab  Result Value Ref Range   MRSA by PCR Next Gen NOT DETECTED NOT DETECTED    Comment: (NOTE) The GeneXpert MRSA Assay (FDA approved for NASAL specimens only), is one component of a comprehensive MRSA colonization surveillance program. It is not intended to diagnose MRSA infection nor to guide or monitor treatment for MRSA infections. Test performance is not FDA approved in patients less than 63 years old. Performed at [facility], [address], [place], [place] [address]   Urinalysis,  Complete w Microscopic -Urine, Random     Status: Abnormal   Collection Time: 01/11/24 10:35 AM  Result Value Ref Range   Color, Urine YELLOW YELLOW   APPearance CLOUDY (A) CLEAR   Specific Gravity, Urine 1.015 1.005 - 1.030   pH 5.0 5.0 - 8.0   Glucose, UA 150 (A) NEGATIVE mg/dL   Hgb urine dipstick MODERATE (A) NEGATIVE   Bilirubin Urine NEGATIVE NEGATIVE   Ketones, ur NEGATIVE NEGATIVE mg/dL   Protein, ur >=191 (A) NEGATIVE mg/dL   Nitrite NEGATIVE NEGATIVE   Leukocytes,Ua NEGATIVE NEGATIVE   RBC / HPF 0-5 0 - 5 RBC/hpf   WBC, UA 0-5 0 - 5 WBC/hpf   Bacteria, UA RARE (A) NONE SEEN   Squamous Epithelial / HPF 0-5 0 - 5 /HPF    Comment: Performed at [facility], [address], [place], [place] [address]  Sodium, urine, random     Status: None   Collection Time: 01/11/24 10:36 AM  Result Value Ref Range   Sodium, Ur 57 mmol/L    Comment: Performed at [facility], [address], [place], [place] [address]  Creatinine, urine, random     Status: None   Collection Time: 01/11/24 10:36 AM  Result Value Ref Range   Creatinine, Urine 64 mg/dL    Comment: Performed at [facility], [address] [place], [place] [address]  Glucose, capillary     Status: Abnormal   Collection Time: 01/11/24 11:41 AM  Result Value Ref Range   Glucose-Capillary 101 (H) 70 - 99 mg/dL    Comment: Glucose reference range applies only to samples taken after fasting for at least 8 hours.   Comment 1 Notify RN    Comment 2 Document in Chart     ECHOCARDIOGRAM COMPLETE BUBBLE STUDY Result Date: 01/10/2024    ECHOCARDIOGRAM REPORT   Patient Name:   ARLING CERONE Date of Exam: 01/10/2024 Medical Rec #:  [id number]      Height:       68.0 in Accession #:    [id number]     Weight:       253.0 lb Date of Birth:  09-Oct-1970      BSA:          2.258 m Patient Age:    52 years       BP:           148/72 mmHg Patient Gender: M              HR:           97 bpm. Exam Location:  Inpatient Procedure: 2D  Echo, Cardiac Doppler, Color Doppler and Saline Contrast Bubble            Study (Both Spectral and Color Flow Doppler were utilized during            procedure).  Indications:    Pulmonary Embolus                 Acute hypoxemic respiratory failure  History:        Patient has prior history of Echocardiogram examinations, most                 recent 01/19/2023. CHF and Cardiomegaly, Previous Myocardial                 Infarction and CAD, Pulmonary HTN and CKD, stage 4,                 Signs/Symptoms:Chest Pain; Risk Factors:Hypertension, Diabetes                 and Dyslipidemia.  Sonographer:    Lucendia Herrlich RCS Referring Phys: [phone] EKTA V PATEL IMPRESSIONS  1. Left ventricular ejection fraction, by estimation, is 60 to 65%. The left ventricle has normal function. The left ventricle has no regional wall motion abnormalities. There is mild left ventricular hypertrophy. Left ventricular diastolic parameters are indeterminate.  2. Right ventricular systolic function is normal. The right ventricular size is normal. Tricuspid regurgitation signal is inadequate for assessing PA pressure.  3. Left atrial size was mildly dilated.  4. The mitral valve is grossly normal. Trivial mitral valve regurgitation. No evidence of mitral stenosis.  5. The aortic valve is grossly normal. There is mild calcification of the aortic valve. Aortic valve regurgitation is not visualized. No aortic stenosis is present.  6. The inferior vena cava is normal in size with greater than 50% respiratory variability, suggesting right atrial pressure of 3 mmHg.  7. Agitated saline contrast bubble study was negative, with no evidence of any interatrial shunt. Comparison(s): No significant change from prior study. FINDINGS  Left Ventricle: Left ventricular ejection fraction, by estimation, is 60 to 65%. The left ventricle has normal function. The left ventricle has no regional wall motion abnormalities. The left ventricular internal cavity size was  normal in size. There is  mild left ventricular hypertrophy. Left ventricular diastolic parameters are indeterminate. Right Ventricle: The right ventricular size is normal. Right vetricular wall thickness was not well visualized. Right ventricular systolic function is normal. Tricuspid regurgitation signal is inadequate for assessing PA pressure. Left Atrium: Left atrial size was mildly dilated. Right Atrium: Right atrial size was normal in size. Pericardium: Trivial pericardial effusion is present. Mitral Valve: The mitral valve is grossly normal. Trivial mitral valve regurgitation. No evidence of mitral valve stenosis. Tricuspid Valve: The tricuspid valve is grossly normal. Tricuspid valve regurgitation is trivial. No evidence of tricuspid stenosis. Aortic Valve: The aortic valve is grossly normal. There is mild calcification of the aortic valve. Aortic valve regurgitation is not visualized. No aortic stenosis is present. Aortic valve peak gradient measures 6.7 mmHg. Pulmonic Valve: The pulmonic valve was not well visualized. Pulmonic valve regurgitation is not visualized. No evidence of pulmonic stenosis. Aorta: The aortic root, ascending aorta, aortic arch and descending aorta are all structurally normal, with no evidence of dilitation or obstruction. Venous: The inferior vena cava is normal in size with greater than 50% respiratory variability, suggesting right atrial pressure of 3 mmHg. IAS/Shunts: The atrial septum is grossly normal. Agitated saline contrast was given intravenously to evaluate for intracardiac shunting. Agitated saline contrast bubble study was negative, with no evidence of any interatrial shunt.  LEFT VENTRICLE PLAX 2D LVIDd:         4.30 cm   Diastology LVIDs:  2.80 cm   LV e' medial:    7.89 cm/s LV PW:         1.30 cm   LV E/e' medial:  18.8 LV IVS:        0.90 cm   LV e' lateral:   9.20 cm/s LVOT diam:     2.30 cm   LV E/e' lateral: 16.1 LV SV:         72 LV SV Index:   32 LVOT  Area:     4.15 cm  RIGHT VENTRICLE             IVC RV S prime:     13.70 cm/s  IVC diam: 1.70 cm TAPSE (M-mode): 2.3 cm LEFT ATRIUM             Index        RIGHT ATRIUM           Index LA diam:        3.70 cm 1.64 cm/m   RA Area:     19.30 cm LA Vol (A2C):   70.7 ml 31.30 ml/m  RA Volume:   55.40 ml  24.53 ml/m LA Vol (A4C):   67.7 ml 29.98 ml/m LA Biplane Vol: 70.9 ml 31.39 ml/m  AORTIC VALVE AV Area (Vmax): 3.49 cm AV Vmax:        129.00 cm/s AV Peak Grad:   6.7 mmHg LVOT Vmax:      108.40 cm/s LVOT Vmean:     72.420 cm/s LVOT VTI:       0.172 m  AORTA Ao Root diam: 3.10 cm Ao Asc diam:  3.50 cm MITRAL VALVE MV Area (PHT): 5.02 cm     SHUNTS MV Decel Time: 151 msec     Systemic VTI:  0.17 m MV E velocity: 148.00 cm/s  Systemic Diam: 2.30 cm MV A velocity: 48.00 cm/s MV E/A ratio:  3.08 Jodelle Red MD Electronically signed by Jodelle Red MD Signature Date/Time: 01/10/2024/4:40:23 PM    Final    VAS [place] LOWER EXTREMITY VENOUS (DVT) (7a-7p) Result Date: 01/09/2024  Lower Venous DVT Study Patient Name:  MINOR IDEN  Date of Exam:   01/09/2024 Medical Rec #: [id number]       Accession #:    [id number] Date of Birth: 04-02-1971       Patient Gender: M Patient Age:   78 years Exam Location:  [facility] Procedure:      VAS [place] LOWER EXTREMITY VENOUS (DVT) Referring Phys: Lorin Picket [place] --------------------------------------------------------------------------------  Indications: Swelling, and Edema.  Risk Factors: Obesity. Comparison Study: No changes seen since previous exam 04/18/23. Performing Technologist: Shona Simpson  Examination Guidelines: A complete evaluation includes B-mode imaging, spectral Doppler, color Doppler, and power Doppler as needed of all accessible portions of each vessel. Bilateral testing is considered an integral part of a complete examination. Limited examinations for reoccurring indications may be performed as noted. The reflux portion of the exam is  performed with the patient in reverse Trendelenburg.  +---------+---------------+---------+-----------+----------+--------------+ RIGHT    CompressibilityPhasicitySpontaneityPropertiesThrombus Aging +---------+---------------+---------+-----------+----------+--------------+ CFV      Full           Yes      Yes                                 +---------+---------------+---------+-----------+----------+--------------+ SFJ      Full                                                        +---------+---------------+---------+-----------+----------+--------------+  FV Prox  Full                                                        +---------+---------------+---------+-----------+----------+--------------+ FV Mid   Full                                                        +---------+---------------+---------+-----------+----------+--------------+ FV DistalFull                                                        +---------+---------------+---------+-----------+----------+--------------+ PFV      Full                                                        +---------+---------------+---------+-----------+----------+--------------+ POP      Full           Yes      Yes                                 +---------+---------------+---------+-----------+----------+--------------+ PTV      Full                                                        +---------+---------------+---------+-----------+----------+--------------+ PERO     Full                                                        +---------+---------------+---------+-----------+----------+--------------+   +---------+---------------+---------+-----------+----------+--------------+ LEFT     CompressibilityPhasicitySpontaneityPropertiesThrombus Aging +---------+---------------+---------+-----------+----------+--------------+ CFV      Full           Yes      Yes                                  +---------+---------------+---------+-----------+----------+--------------+ SFJ      Full                                                        +---------+---------------+---------+-----------+----------+--------------+ FV Prox  Full                                                        +---------+---------------+---------+-----------+----------+--------------+  FV Mid   Full                                                        +---------+---------------+---------+-----------+----------+--------------+ FV DistalFull                                                        +---------+---------------+---------+-----------+----------+--------------+ PFV      Full                                                        +---------+---------------+---------+-----------+----------+--------------+ POP      Full           Yes      Yes                                 +---------+---------------+---------+-----------+----------+--------------+ PTV      Full                                                        +---------+---------------+---------+-----------+----------+--------------+ PERO     Partial                                                     +---------+---------------+---------+-----------+----------+--------------+     Summary: BILATERAL: - No evidence of deep vein thrombosis seen in the lower extremities, bilaterally. -No evidence of popliteal cyst, bilaterally.   *See table(s) above for measurements and observations. Electronically signed by Lemar Livings MD on 01/09/2024 at 7:49:29 PM.    Final    CT CHEST WO CONTRAST Result Date: 01/09/2024 CLINICAL DATA:  Chest pain. EXAM: CT CHEST WITHOUT CONTRAST TECHNIQUE: Multidetector CT imaging of the chest was performed following the standard protocol without IV contrast. RADIATION DOSE REDUCTION: This exam was performed according to the departmental dose-optimization program which includes automated exposure  control, adjustment of the mA and/or kV according to patient size and/or use of iterative reconstruction technique. COMPARISON:  Lung bases from abdominopelvic CT earlier today. Chest CT 03/19/2023 FINDINGS: Occasion scanned in decubitus position. Cardiovascular: The heart is enlarged. There are coronary artery calcifications. Small pericardial effusion. Aortic atherosclerosis without aneurysm. Mediastinum/Nodes: Mild mediastinal adenopathy. 13 mm paratracheal node series 8, image 32. Hilar assessment is limited in the absence of IV contrast. Decompressed esophagus. Lungs/Pleura: Diffuse and multifocal ground-glass and ground-glass nodularity throughout both lungs, with a basilar predominant distribution. Findings are progressed from CT earlier today. Mild associated smooth septal thickening. There is central bronchial thickening. No significant pleural effusion. Upper Abdomen: Assessed on abdominopelvic CT earlier today. Musculoskeletal: Dependent skin thickening with areas of subcutaneous edema, series 8, image 57 to the  left of midline and series 8, image 11 to the right of midline. No obvious drainable collection in the absence of IV contrast. There are no acute or suspicious osseous abnormalities. IMPRESSION: 1. Diffuse and multifocal ground-glass and ground-glass nodularity throughout both lungs, with a basilar predominant distribution. Findings are progressed from CT earlier today, favor multifocal pneumonia. 2. Smooth septal thickening is suspicious for pulmonary edema. 3. Mild mediastinal adenopathy, likely reactive. 4. Cardiomegaly with coronary artery calcifications and small pericardial effusion. 5. Dependent skin thickening with areas of subcutaneous edema, may represent cellulitis. No obvious drainable collection in the absence of IV contrast. Aortic Atherosclerosis (ICD10-I70.0). Electronically Signed   By: Narda Rutherford M.D.   On: 01/09/2024 18:29   NM Pulmonary Perfusion Addendum Date:  01/09/2024 ADDENDUM REPORT: 01/09/2024 14:45 ADDENDUM: Critical Value/emergent results were called by telephone at the time of interpretation on 01/09/2024 at 2:45 pm to provider Pricilla Loveless , who verbally acknowledged these results. Electronically Signed   By: Signa Kell M.D.   On: 01/09/2024 14:45   Result Date: 01/09/2024 CLINICAL DATA:  Evaluate for acute pulmonary embolism. EXAM: NUCLEAR MEDICINE PERFUSION LUNG SCAN TECHNIQUE: Perfusion images were obtained in multiple projections after intravenous injection of radiopharmaceutical. Ventilation scans intentionally deferred if perfusion scan and chest x-ray adequate for interpretation during COVID 19 epidemic. RADIOPHARMACEUTICALS:  4.1 mCi Tc-2m MAA IV COMPARISON:  Chest radiograph from 01/09/2024 FINDINGS: There is a large perfusion defect identified within the lateral aspect of the right upper lobe without corresponding chest radiograph abnormality. No additional segmental perfusion defects identified. IMPRESSION: Examination is positive for single large segmental perfusion defect within the right upper lobe compatible with pulmonary embolism. Electronically Signed: By: Signa Kell M.D. On: 01/09/2024 14:28    Assessment/Plan  AKI on CKD IV:  In the setting of PE and volume overload.  TTE without R heart strain.  Hypoalbuminemic.  Longstanding h/o poorly controlled DM.    - send UA and UP/C  - renal US pending  - appears volume overloaded and needs more diuresis- Increase Lasix to 80 IV BID  - no hard indication for HD yet- hopefully will not get there  2.  Acute PE:  - hep gtt  - Dopplers without evidence of DVT  3.  Acute on chronic CHF exac:  - appears that TTE shows EF 60-65%, no r heart strain  - Lasix as above  4.  Multifocal PNA:  - CAP coverage per primary  5.  DM II, poorly controlled  - per primary  6.  Dispo: pending  Sava Proby 01/11/2024, 1:15 PM

## 2024-01-11 NOTE — Progress Notes (Signed)
 Nurse requested Mobility Specialist to perform oxygen saturation test with pt which includes removing pt from oxygen both at rest and while ambulating.  Below are the results from that testing.     Patient Saturations on Room Air at Rest = spO2 86%  Patient Saturations on Room Air while standing = sp02 85% .  Rested and performed pursed lip breathing for 1 minute with sp02 at 85%.  Patient Saturations on 6 Liters of oxygen while standing = sp02 91% Deferred ambulation d/t pt reporting severe dizziness.   At end of testing pt left in room on 3  Liters of oxygen.  Reported results to nurse.    Feliciana Rossetti Mobility Specialist Please contact via Special educational needs teacher or  Rehab office at [phone]

## 2024-01-11 NOTE — Progress Notes (Signed)
 PROGRESS NOTE    Leroy Simmons  WUX:[id number] DOB: 12/27/1970 DOA: 01/09/2024 PCP: Grayce Sessions, NP    Chief Complaint  Patient presents with   Headache   Chills    Brief Narrative:  Patient 53 year old gentleman history of hypertension, CHF, diabetes type 2, CKD stage IIIb, anxiety/depression, history of DVT not on anticoagulation recurrent falls presenting with headaches, chills, cough, congestion, nausea, vomiting, diarrhea.  Patient seen in the ED noted to be in acute hypoxic respiratory failure requiring O2, VQ scan done concerning for PE, CT chest done concerning for multifocal pneumonia versus volume overload.  Patient placed on IV heparin and empiric IV antibiotics.    Assessment & Plan:   Principal Problem:   Pulmonary embolism (HCC) Active Problems:   Acute on chronic diastolic CHF (congestive heart failure) (HCC)   NSTEMI (non-ST elevated myocardial infarction) (HCC)   Acute kidney injury superimposed on CKD (HCC)   Uncontrolled type 2 diabetes mellitus with hyperglycemia, with long-term current use of insulin (HCC)   Deep venous thrombosis (DVT) of left peroneal vein (HCC)   Anemia of chronic disease   Obesity (BMI 30-39.9)   Diabetes mellitus (HCC)   Anxiety and depression   Coronary artery disease involving native coronary artery of native heart without angina pectoris   CKD (chronic kidney disease) stage 4, GFR 15-29 ml/min (HCC)  #1 acute hypoxic respiratory failure with hypoxia likely multifactorial secondary to acute PE/multifocal pneumonia versus acute diastolic CHF exacerbation. -Patient noted to present with cough, congestion, shortness of breath, noted to be hypoxic with sats of 86% on room air on presentation.  Patient history of DVT. -VQ scan done positive for large single segmental perfusion defect within the right upper lobe compatible with PE. -Lower extremity Dopplers done negative for DVT. -CT chest without contrast done nodularity  throughout both lungs with a basilar predominant distribution.  Favor multifocal pneumonia.  Smooth septal thickening suspicious for pulmonary edema.  Mild mediastinal adenopathy likely reactive.  Cardiomegaly with coronary artery calcifications and small pericardial effusion.  Dependent skin thickening with area of subcutaneous edema may represent cellulitis.  No obvious drainable collection in the absence of IV contrast. -2D echo ordered and pending. -Patient with significant lower extremity edema on examination and diffuse crackles noted. -Urine Legionella antigen, urine pneumococcus antigen pending. -Change IV vancomycin and Zosyn to IV Rocephin and azithromycin due to worsening renal function.  -Continue heparin drip. -Continue Lasix 40 mg IV every 12 hours, may need to increase diuretics due to volume overload on examination however due to worsening renal function will have renal weigh gain.. -Strict I's and O's, daily weights.  2.  Right upper lobe PE -Continue IV heparin drip. -Could likely transition to oral DOAC in the next 48 to 72 hours if continued clinical improvement. -Patient with prior history of DVT, now with acute PE, will likely require lifetime anticoagulation. -Outpatient follow-up with hematology.  3.  Poorly controlled diabetes mellitus type 2 -Hemoglobin A1c 10.8 (09/09/2023). -Repeat hemoglobin A1c 9.3. -Tolerating current diet.  - CBG 139. -Per med rec patient on Lantus 45 units twice daily and NovoLog meal coverage insulin 5 units 3 times daily. -Continue Semglee 20 units twice daily, SSI.  4.  Diarrhea -Likely secondary to viral gastroenteritis. -C. difficile PCR negative. -GI pathogen panel negative. -Imodium as needed.  5.  Hypertension -Continue home regimen Norvasc 10 mg daily. -Patient started on IV Lasix. -Follow-up.  6.  AKI on CKD stage IIIb/IV -Patient noted to be volume overloaded  on examination which is worsened over the past 24 hours.    -Renal function trending up with diuresis.   -Check a renal ultrasound, check a urine sodium, check a urine creatinine.   -Continue Lasix 40 mg IV every 12 hours, may need to increase Lasix further due to volume overload on examination.   -Will consult with nephrology for further evaluation and management.   DVT prophylaxis: Heparin GTT Code Status: Full Family Communication: Update me at bedside. Disposition: Likely home when clinically improved  Status is: Inpatient The patient will require care spanning > 2 midnights and should be moved to inpatient because: Severity of illness   Consultants:  None  Procedures:  CT chest without contrast 01/09/2024 CT abdomen and pelvis 01/09/2024 Chest x-ray 01/09/2024 VQ scan 01/09/2024 2D echo pending Lower extremity Dopplers 01/09/2024  Antimicrobials:  Anti-infectives (From admission, onward)    Start     Dose/Rate Route Frequency Ordered Stop   01/11/24 2200  vancomycin (VANCOREADY) IVPB 1750 mg/350 mL        1,750 mg 175 mL/hr over 120 Minutes Intravenous Every 48 hours 01/09/24 2351     01/10/24 0000  piperacillin-tazobactam (ZOSYN) IVPB 3.375 g        3.375 g 12.5 mL/hr over 240 Minutes Intravenous Every 8 hours 01/09/24 2347     01/10/24 0000  vancomycin (VANCOREADY) IVPB 2000 mg/400 mL        2,000 mg 200 mL/hr over 120 Minutes Intravenous  Once 01/09/24 2348 01/10/24 0239   01/09/24 1200  cefTRIAXone (ROCEPHIN) 1 g in sodium chloride 0.9 % 100 mL IVPB        1 g 200 mL/hr over 30 Minutes Intravenous  Once 01/09/24 1154 01/09/24 1233   01/09/24 1200  azithromycin (ZITHROMAX) 500 mg in sodium chloride 0.9 % 250 mL IVPB        500 mg 250 mL/hr over 60 Minutes Intravenous  Once 01/09/24 1154 01/09/24 1504         Subjective: Patient sitting up at the side of the bed.  Stated he had a bad night and was treated badly by the night nurse and would prefer a different nurse this evening.  States he had ongoing coughing overnight.   States no significant change with shortness of breath.  Denies any chest pain.  Still with ongoing watery loose stools and stated received a dose of Imodium this morning.  Tolerating current diet.   Objective: Vitals:   01/11/24 0540 01/11/24 0605 01/11/24 0752 01/11/24 0833  BP:  (!) 148/82 (!) 147/74 (!) 147/74  Pulse: (!) 114 (!) 104 (!) 110   Resp: 20 (!) 24 20   Temp:   98.1 F (36.7 C)   TempSrc:   Oral   SpO2:   94%   Weight:      Height:        Intake/Output Summary (Last 24 hours) at 01/11/2024 1104 Last data filed at 01/11/2024 0800 Gross per 24 hour  Intake 1663.98 ml  Output 1975 ml  Net -311.02 ml   Filed Weights   01/09/24 0519 01/11/24 0316  Weight: 114.8 kg 112.1 kg    Examination:  General exam: NAD. Respiratory system: Bibasilar crackles.  No wheezing.  Fair air movement.  On 3 L nasal cannula with sats of 94%.  Cardiovascular system: RRR no murmurs rubs or gallops.  3+ bilateral lower extremity edema.  Gastrointestinal system: Abdomen is soft, nontender, nondistended, positive bowel sounds.  No rebound.  No guarding. Central  nervous system: Alert and oriented. No focal neurological deficits. Extremities: 3+ bilateral lower extremity edema.   Skin: No rashes, lesions or ulcers Psychiatry: Judgement and insight appear normal. Mood & affect appropriate.     Data Reviewed: I have personally reviewed following labs and imaging studies  CBC: Recent Labs  Lab 01/09/24 0534 01/10/24 0508 01/11/24 0429  WBC 6.6 6.3 6.8  NEUTROABS 4.9  --   --   HGB 13.9 12.3* 10.0*  HCT 42.0 37.2* 29.2*  MCV 89.9 88.2 87.4  PLT 246 220 174    Basic Metabolic Panel: Recent Labs  Lab 01/09/24 0534 01/10/24 0508 01/11/24 0429  NA 134* 134* 134*  K 4.1 3.5 3.9  CL 104 104 102  CO2 19* 19* 17*  GLUCOSE 199* 129* 150*  BUN 35* 39* 46*  CREATININE 2.62* 3.03* 3.79*  CALCIUM 8.2* 7.7* 7.9*  PHOS  --   --  3.5    GFR: Estimated Creatinine Clearance: 28.3  mL/min (A) (by C-G formula based on SCr of 3.79 mg/dL (H)).  Liver Function Tests: Recent Labs  Lab 01/09/24 0534 01/10/24 0508 01/11/24 0429  AST 36 39  --   ALT 27 28  --   ALKPHOS 107 92  --   BILITOT 0.7 0.9  --   PROT 5.9* 5.4*  --   ALBUMIN 2.0* 1.7* 2.0*    CBG: Recent Labs  Lab 01/10/24 1200 01/10/24 1707 01/10/24 2058 01/11/24 0555 01/11/24 0831  GLUCAP 149* 125* 188* 104* 139*     Recent Results (from the past 240 hours)  Resp panel by RT-PCR (RSV, Flu A&B, Covid) Anterior Nasal Swab     Status: None   Collection Time: 01/09/24  5:23 AM   Specimen: Anterior Nasal Swab  Result Value Ref Range Status   SARS Coronavirus 2 by RT PCR NEGATIVE NEGATIVE Final   Influenza A by PCR NEGATIVE NEGATIVE Final   Influenza B by PCR NEGATIVE NEGATIVE Final    Comment: (NOTE) The Xpert Xpress SARS-CoV-2/FLU/RSV plus assay is intended as an aid in the diagnosis of influenza from Nasopharyngeal swab specimens and should not be used as a sole basis for treatment. Nasal washings and aspirates are unacceptable for Xpert Xpress SARS-CoV-2/FLU/RSV testing.  Fact Sheet for Patients: BloggerCourse.com  Fact Sheet for Healthcare Providers: SeriousBroker.it  This test is not yet approved or cleared by the [place] FDA and has been authorized for detection and/or diagnosis of SARS-CoV-2 by FDA under an Emergency Use Authorization (EUA). This EUA will remain in effect (meaning this test can be used) for the duration of the COVID-19 declaration under Section 564(b)(1) of the Act, 21 U.S.C. section 360bbb-3(b)(1), unless the authorization is terminated or revoked.     Resp Syncytial Virus by PCR NEGATIVE NEGATIVE Final    Comment: (NOTE) Fact Sheet for Patients: BloggerCourse.com  Fact Sheet for Healthcare Providers: SeriousBroker.it  This test is not yet approved or  cleared by the [place] FDA and has been authorized for detection and/or diagnosis of SARS-CoV-2 by FDA under an Emergency Use Authorization (EUA). This EUA will remain in effect (meaning this test can be used) for the duration of the COVID-19 declaration under Section 564(b)(1) of the Act, 21 U.S.C. section 360bbb-3(b)(1), unless the authorization is terminated or revoked.  Performed at [facility], [address] [place], [place] [address]   Culture, blood (routine x 2)     Status: None (Preliminary result)   Collection Time: 01/09/24  8:25 AM  Specimen: BLOOD LEFT FOREARM  Result Value Ref Range Status   Specimen Description BLOOD LEFT FOREARM  Final   Special Requests   Final    BOTTLES DRAWN AEROBIC AND ANAEROBIC Blood Culture results may not be optimal due to an inadequate volume of blood received in culture bottles   Culture   Final    NO GROWTH 2 DAYS Performed at [facility], [address] [place], [place] [address]    Report Status PENDING  Incomplete  Culture, blood (routine x 2)     Status: None (Preliminary result)   Collection Time: 01/09/24  9:43 AM   Specimen: BLOOD LEFT HAND  Result Value Ref Range Status   Specimen Description BLOOD LEFT HAND  Final   Special Requests   Final    BOTTLES DRAWN AEROBIC AND ANAEROBIC Blood Culture results may not be optimal due to an inadequate volume of blood received in culture bottles   Culture   Final    NO GROWTH 2 DAYS Performed at [facility], [address] [place], [place] [address]    Report Status PENDING  Incomplete  Gastrointestinal Panel by PCR , Stool     Status: None   Collection Time: 01/09/24  5:03 PM   Specimen: Stool  Result Value Ref Range Status   Campylobacter species NOT DETECTED NOT DETECTED Final   Plesimonas shigelloides NOT DETECTED NOT DETECTED Final   Salmonella species NOT DETECTED NOT DETECTED Final   Yersinia enterocolitica NOT DETECTED NOT DETECTED Final    Vibrio species NOT DETECTED NOT DETECTED Final   Vibrio cholerae NOT DETECTED NOT DETECTED Final   Enteroaggregative E coli (EAEC) NOT DETECTED NOT DETECTED Final   Enteropathogenic E coli (EPEC) NOT DETECTED NOT DETECTED Final   Enterotoxigenic E coli (ETEC) NOT DETECTED NOT DETECTED Final   Shiga like toxin producing E coli (STEC) NOT DETECTED NOT DETECTED Final   Shigella/Enteroinvasive E coli (EIEC) NOT DETECTED NOT DETECTED Final   Cryptosporidium NOT DETECTED NOT DETECTED Final   Cyclospora cayetanensis NOT DETECTED NOT DETECTED Final   Entamoeba histolytica NOT DETECTED NOT DETECTED Final   Giardia lamblia NOT DETECTED NOT DETECTED Final   Adenovirus F40/41 NOT DETECTED NOT DETECTED Final   Astrovirus NOT DETECTED NOT DETECTED Final   Norovirus GI/GII NOT DETECTED NOT DETECTED Final   Rotavirus A NOT DETECTED NOT DETECTED Final   Sapovirus (I, II, IV, and V) NOT DETECTED NOT DETECTED Final    Comment: Performed at [facility], [address] [place], [place] [address]  C Difficile Quick Screen w PCR reflex     Status: None   Collection Time: 01/09/24  5:03 PM   Specimen: Stool  Result Value Ref Range Status   C Diff antigen NEGATIVE NEGATIVE Final   C Diff toxin NEGATIVE NEGATIVE Final   C Diff interpretation No C. difficile detected.  Final    Comment: Performed at [facility], [address] [place], [place] [address]  MRSA Next Gen by PCR, Nasal     Status: None   Collection Time: 01/11/24  8:47 AM   Specimen: Nasal Mucosa; Nasal Swab  Result Value Ref Range Status   MRSA by PCR Next Gen NOT DETECTED NOT DETECTED Final    Comment: (NOTE) The GeneXpert MRSA Assay (FDA approved for NASAL specimens only), is one component of a comprehensive MRSA colonization surveillance program. It is not intended to diagnose MRSA infection nor to guide or monitor treatment  for MRSA infections. Test performance is not FDA approved in patients less than 68  years old. Performed at [facility], [address] [place], [place] [address]          Radiology Studies: ECHOCARDIOGRAM COMPLETE BUBBLE STUDY Result Date: 01/10/2024    ECHOCARDIOGRAM REPORT   Patient Name:   Leroy Simmons Date of Exam: 01/10/2024 Medical Rec #:  [id number]      Height:       68.0 in Accession #:    [id number]     Weight:       253.0 lb Date of Birth:  12-16-1970      BSA:          2.258 m Patient Age:    52 years       BP:           148/72 mmHg Patient Gender: M              HR:           97 bpm. Exam Location:  Inpatient Procedure: 2D Echo, Cardiac Doppler, Color Doppler and Saline Contrast Bubble            Study (Both Spectral and Color Flow Doppler were utilized during            procedure). Indications:    Pulmonary Embolus                 Acute hypoxemic respiratory failure  History:        Patient has prior history of Echocardiogram examinations, most                 recent 01/19/2023. CHF and Cardiomegaly, Previous Myocardial                 Infarction and CAD, Pulmonary HTN and CKD, stage 4,                 Signs/Symptoms:Chest Pain; Risk Factors:Hypertension, Diabetes                 and Dyslipidemia.  Sonographer:    Lucendia Herrlich RCS Referring Phys: [phone] EKTA V PATEL IMPRESSIONS  1. Left ventricular ejection fraction, by estimation, is 60 to 65%. The left ventricle has normal function. The left ventricle has no regional wall motion abnormalities. There is mild left ventricular hypertrophy. Left ventricular diastolic parameters are indeterminate.  2. Right ventricular systolic function is normal. The right ventricular size is normal. Tricuspid regurgitation signal is inadequate for assessing PA pressure.  3. Left atrial size was mildly dilated.  4. The mitral valve is grossly normal. Trivial mitral valve regurgitation. No evidence of mitral stenosis.  5. The aortic valve is grossly normal. There is mild calcification of the aortic valve. Aortic valve regurgitation  is not visualized. No aortic stenosis is present.  6. The inferior vena cava is normal in size with greater than 50% respiratory variability, suggesting right atrial pressure of 3 mmHg.  7. Agitated saline contrast bubble study was negative, with no evidence of any interatrial shunt. Comparison(s): No significant change from prior study. FINDINGS  Left Ventricle: Left ventricular ejection fraction, by estimation, is 60 to 65%. The left ventricle has normal function. The left ventricle has no regional wall motion abnormalities. The left ventricular internal cavity size was normal in size. There is  mild left ventricular hypertrophy. Left ventricular diastolic parameters are indeterminate. Right Ventricle: The right ventricular size is normal. Right vetricular wall thickness was  not well visualized. Right ventricular systolic function is normal. Tricuspid regurgitation signal is inadequate for assessing PA pressure. Left Atrium: Left atrial size was mildly dilated. Right Atrium: Right atrial size was normal in size. Pericardium: Trivial pericardial effusion is present. Mitral Valve: The mitral valve is grossly normal. Trivial mitral valve regurgitation. No evidence of mitral valve stenosis. Tricuspid Valve: The tricuspid valve is grossly normal. Tricuspid valve regurgitation is trivial. No evidence of tricuspid stenosis. Aortic Valve: The aortic valve is grossly normal. There is mild calcification of the aortic valve. Aortic valve regurgitation is not visualized. No aortic stenosis is present. Aortic valve peak gradient measures 6.7 mmHg. Pulmonic Valve: The pulmonic valve was not well visualized. Pulmonic valve regurgitation is not visualized. No evidence of pulmonic stenosis. Aorta: The aortic root, ascending aorta, aortic arch and descending aorta are all structurally normal, with no evidence of dilitation or obstruction. Venous: The inferior vena cava is normal in size with greater than 50% respiratory  variability, suggesting right atrial pressure of 3 mmHg. IAS/Shunts: The atrial septum is grossly normal. Agitated saline contrast was given intravenously to evaluate for intracardiac shunting. Agitated saline contrast bubble study was negative, with no evidence of any interatrial shunt.  LEFT VENTRICLE PLAX 2D LVIDd:         4.30 cm   Diastology LVIDs:         2.80 cm   LV e' medial:    7.89 cm/s LV PW:         1.30 cm   LV E/e' medial:  18.8 LV IVS:        0.90 cm   LV e' lateral:   9.20 cm/s LVOT diam:     2.30 cm   LV E/e' lateral: 16.1 LV SV:         72 LV SV Index:   32 LVOT Area:     4.15 cm  RIGHT VENTRICLE             IVC RV S prime:     13.70 cm/s  IVC diam: 1.70 cm TAPSE (M-mode): 2.3 cm LEFT ATRIUM             Index        RIGHT ATRIUM           Index LA diam:        3.70 cm 1.64 cm/m   RA Area:     19.30 cm LA Vol (A2C):   70.7 ml 31.30 ml/m  RA Volume:   55.40 ml  24.53 ml/m LA Vol (A4C):   67.7 ml 29.98 ml/m LA Biplane Vol: 70.9 ml 31.39 ml/m  AORTIC VALVE AV Area (Vmax): 3.49 cm AV Vmax:        129.00 cm/s AV Peak Grad:   6.7 mmHg LVOT Vmax:      108.40 cm/s LVOT Vmean:     72.420 cm/s LVOT VTI:       0.172 m  AORTA Ao Root diam: 3.10 cm Ao Asc diam:  3.50 cm MITRAL VALVE MV Area (PHT): 5.02 cm     SHUNTS MV Decel Time: 151 msec     Systemic VTI:  0.17 m MV E velocity: 148.00 cm/s  Systemic Diam: 2.30 cm MV A velocity: 48.00 cm/s MV E/A ratio:  3.08 Jodelle Red MD Electronically signed by Jodelle Red MD Signature Date/Time: 01/10/2024/4:40:23 PM    Final    VAS [place] LOWER EXTREMITY VENOUS (DVT) (7a-7p) Result Date: 01/09/2024  Lower Venous DVT Study  Patient Name:  Leroy Simmons  Date of Exam:   01/09/2024 Medical Rec #: [id number]       Accession #:    [id number] Date of Birth: 1971/01/04       Patient Gender: M Patient Age:   29 years Exam Location:  [facility] Procedure:      VAS [place] LOWER EXTREMITY VENOUS (DVT) Referring Phys: Lorin Picket [place]  --------------------------------------------------------------------------------  Indications: Swelling, and Edema.  Risk Factors: Obesity. Comparison Study: No changes seen since previous exam 04/18/23. Performing Technologist: Shona Simpson  Examination Guidelines: A complete evaluation includes B-mode imaging, spectral Doppler, color Doppler, and power Doppler as needed of all accessible portions of each vessel. Bilateral testing is considered an integral part of a complete examination. Limited examinations for reoccurring indications may be performed as noted. The reflux portion of the exam is performed with the patient in reverse Trendelenburg.  +---------+---------------+---------+-----------+----------+--------------+ RIGHT    CompressibilityPhasicitySpontaneityPropertiesThrombus Aging +---------+---------------+---------+-----------+----------+--------------+ CFV      Full           Yes      Yes                                 +---------+---------------+---------+-----------+----------+--------------+ SFJ      Full                                                        +---------+---------------+---------+-----------+----------+--------------+ FV Prox  Full                                                        +---------+---------------+---------+-----------+----------+--------------+ FV Mid   Full                                                        +---------+---------------+---------+-----------+----------+--------------+ FV DistalFull                                                        +---------+---------------+---------+-----------+----------+--------------+ PFV      Full                                                        +---------+---------------+---------+-----------+----------+--------------+ POP      Full           Yes      Yes                                 +---------+---------------+---------+-----------+----------+--------------+  PTV      Full                                                        +---------+---------------+---------+-----------+----------+--------------+  PERO     Full                                                        +---------+---------------+---------+-----------+----------+--------------+   +---------+---------------+---------+-----------+----------+--------------+ LEFT     CompressibilityPhasicitySpontaneityPropertiesThrombus Aging +---------+---------------+---------+-----------+----------+--------------+ CFV      Full           Yes      Yes                                 +---------+---------------+---------+-----------+----------+--------------+ SFJ      Full                                                        +---------+---------------+---------+-----------+----------+--------------+ FV Prox  Full                                                        +---------+---------------+---------+-----------+----------+--------------+ FV Mid   Full                                                        +---------+---------------+---------+-----------+----------+--------------+ FV DistalFull                                                        +---------+---------------+---------+-----------+----------+--------------+ PFV      Full                                                        +---------+---------------+---------+-----------+----------+--------------+ POP      Full           Yes      Yes                                 +---------+---------------+---------+-----------+----------+--------------+ PTV      Full                                                        +---------+---------------+---------+-----------+----------+--------------+ PERO     Partial                                                     +---------+---------------+---------+-----------+----------+--------------+  Summary: BILATERAL: - No evidence of deep  vein thrombosis seen in the lower extremities, bilaterally. -No evidence of popliteal cyst, bilaterally.   *See table(s) above for measurements and observations. Electronically signed by Lemar Livings MD on 01/09/2024 at 7:49:29 PM.    Final    CT CHEST WO CONTRAST Result Date: 01/09/2024 CLINICAL DATA:  Chest pain. EXAM: CT CHEST WITHOUT CONTRAST TECHNIQUE: Multidetector CT imaging of the chest was performed following the standard protocol without IV contrast. RADIATION DOSE REDUCTION: This exam was performed according to the departmental dose-optimization program which includes automated exposure control, adjustment of the mA and/or kV according to patient size and/or use of iterative reconstruction technique. COMPARISON:  Lung bases from abdominopelvic CT earlier today. Chest CT 03/19/2023 FINDINGS: Occasion scanned in decubitus position. Cardiovascular: The heart is enlarged. There are coronary artery calcifications. Small pericardial effusion. Aortic atherosclerosis without aneurysm. Mediastinum/Nodes: Mild mediastinal adenopathy. 13 mm paratracheal node series 8, image 32. Hilar assessment is limited in the absence of IV contrast. Decompressed esophagus. Lungs/Pleura: Diffuse and multifocal ground-glass and ground-glass nodularity throughout both lungs, with a basilar predominant distribution. Findings are progressed from CT earlier today. Mild associated smooth septal thickening. There is central bronchial thickening. No significant pleural effusion. Upper Abdomen: Assessed on abdominopelvic CT earlier today. Musculoskeletal: Dependent skin thickening with areas of subcutaneous edema, series 8, image 57 to the left of midline and series 8, image 11 to the right of midline. No obvious drainable collection in the absence of IV contrast. There are no acute or suspicious osseous abnormalities. IMPRESSION: 1. Diffuse and multifocal ground-glass and ground-glass nodularity throughout both lungs, with a basilar  predominant distribution. Findings are progressed from CT earlier today, favor multifocal pneumonia. 2. Smooth septal thickening is suspicious for pulmonary edema. 3. Mild mediastinal adenopathy, likely reactive. 4. Cardiomegaly with coronary artery calcifications and small pericardial effusion. 5. Dependent skin thickening with areas of subcutaneous edema, may represent cellulitis. No obvious drainable collection in the absence of IV contrast. Aortic Atherosclerosis (ICD10-I70.0). Electronically Signed   By: Narda Rutherford M.D.   On: 01/09/2024 18:29   NM Pulmonary Perfusion Addendum Date: 01/09/2024 ADDENDUM REPORT: 01/09/2024 14:45 ADDENDUM: Critical Value/emergent results were called by telephone at the time of interpretation on 01/09/2024 at 2:45 pm to provider Pricilla Loveless , who verbally acknowledged these results. Electronically Signed   By: Signa Kell M.D.   On: 01/09/2024 14:45   Result Date: 01/09/2024 CLINICAL DATA:  Evaluate for acute pulmonary embolism. EXAM: NUCLEAR MEDICINE PERFUSION LUNG SCAN TECHNIQUE: Perfusion images were obtained in multiple projections after intravenous injection of radiopharmaceutical. Ventilation scans intentionally deferred if perfusion scan and chest x-ray adequate for interpretation during COVID 19 epidemic. RADIOPHARMACEUTICALS:  4.1 mCi Tc-97m MAA IV COMPARISON:  Chest radiograph from 01/09/2024 FINDINGS: There is a large perfusion defect identified within the lateral aspect of the right upper lobe without corresponding chest radiograph abnormality. No additional segmental perfusion defects identified. IMPRESSION: Examination is positive for single large segmental perfusion defect within the right upper lobe compatible with pulmonary embolism. Electronically Signed: By: Signa Kell M.D. On: 01/09/2024 14:28        Scheduled Meds:  amLODipine  10 mg Oral Daily   fluticasone  2 spray Each Nare Daily   guaiFENesin  1,200 mg Oral BID   insulin aspart   0-9 Units Subcutaneous TID WC   insulin glargine-yfgn  20 Units Subcutaneous BID   loratadine  10 mg Oral Daily   pantoprazole (PROTONIX) IV  40 mg  Intravenous Q12H   sodium bicarbonate  650 mg Oral BID   sodium chloride flush  3 mL Intravenous Q12H   sodium chloride flush  3-10 mL Intravenous Q12H   sucralfate  1 g Oral TID WC & HS   thiamine (VITAMIN B1) injection  100 mg Intravenous Daily   Continuous Infusions:  heparin 1,650 Units/hr (01/11/24 0943)   piperacillin-tazobactam (ZOSYN)  IV 12.5 mL/hr at 01/11/24 0600   vancomycin       LOS: 1 day    Time spent: 40 mins    Ramiro Harvest, MD [facility]   To contact the attending provider between 7A-7P or the covering provider during after hours 7P-7A, please log into the web site [email or web address] and access using universal [place] password for that web site. If you do not have the password, please call the hospital operator.  01/11/2024, 11:04 AM

## 2024-01-12 ENCOUNTER — Telehealth (HOSPITAL_COMMUNITY): Payer: Self-pay | Admitting: Pharmacy Technician

## 2024-01-12 ENCOUNTER — Other Ambulatory Visit (HOSPITAL_COMMUNITY): Payer: Self-pay

## 2024-01-12 DIAGNOSIS — I2699 Other pulmonary embolism without acute cor pulmonale: Secondary | ICD-10-CM | POA: Diagnosis not present

## 2024-01-12 DIAGNOSIS — J9601 Acute respiratory failure with hypoxia: Secondary | ICD-10-CM | POA: Diagnosis not present

## 2024-01-12 DIAGNOSIS — D638 Anemia in other chronic diseases classified elsewhere: Secondary | ICD-10-CM | POA: Diagnosis not present

## 2024-01-12 DIAGNOSIS — E876 Hypokalemia: Secondary | ICD-10-CM

## 2024-01-12 DIAGNOSIS — I5033 Acute on chronic diastolic (congestive) heart failure: Secondary | ICD-10-CM | POA: Diagnosis not present

## 2024-01-12 LAB — RENAL FUNCTION PANEL
Albumin: 1.8 g/dL — ABNORMAL LOW (ref 3.5–5.0)
Anion gap: 11 (ref 5–15)
BUN: 46 mg/dL — ABNORMAL HIGH (ref 6–20)
CO2: 19 mmol/L — ABNORMAL LOW (ref 22–32)
Calcium: 8.5 mg/dL — ABNORMAL LOW (ref 8.9–10.3)
Chloride: 105 mmol/L (ref 98–111)
Creatinine, Ser: 4.15 mg/dL — ABNORMAL HIGH (ref 0.61–1.24)
GFR, Estimated: 17 mL/min — ABNORMAL LOW (ref >60)
Glucose, Bld: 95 mg/dL (ref 70–99)
Phosphorus: 4.2 mg/dL (ref 2.5–4.6)
Potassium: 3.3 mmol/L — ABNORMAL LOW (ref 3.5–5.1)
Sodium: 135 mmol/L (ref 135–145)

## 2024-01-12 LAB — CBC
HCT: 38.6 % — ABNORMAL LOW (ref 39.0–52.0)
Hemoglobin: 12.9 g/dL — ABNORMAL LOW (ref 13.0–17.0)
MCH: 28.7 pg (ref 26.0–34.0)
MCHC: 33.4 g/dL (ref 30.0–36.0)
MCV: 86 fL (ref 80.0–100.0)
Platelets: 192 10*3/uL (ref 150–400)
RBC: 4.49 MIL/uL (ref 4.22–5.81)
RDW: 12.6 % (ref 11.5–15.5)
WBC: 6.3 10*3/uL (ref 4.0–10.5)
nRBC: 0 % (ref 0.0–0.2)

## 2024-01-12 LAB — GLUCOSE, CAPILLARY
Glucose-Capillary: 99 mg/dL (ref 70–99)
Glucose-Capillary: 135 mg/dL — ABNORMAL HIGH (ref 70–99)
Glucose-Capillary: 119 mg/dL — ABNORMAL HIGH (ref 70–99)
Glucose-Capillary: 153 mg/dL — ABNORMAL HIGH (ref 70–99)

## 2024-01-12 LAB — HEPARIN LEVEL (UNFRACTIONATED): Heparin Unfractionated: 0.7 [IU]/mL (ref 0.30–0.70)

## 2024-01-12 LAB — LEGIONELLA PNEUMOPHILA SEROGP 1 UR AG: L. pneumophila Serogp 1 Ur Ag: NEGATIVE

## 2024-01-12 MED ORDER — POTASSIUM CHLORIDE CRYS ER 10 MEQ PO TBCR
40.0000 meq | EXTENDED_RELEASE_TABLET | Freq: Once | ORAL | Status: AC
  Administered 2024-01-12: 40 meq via ORAL
  Filled 2024-01-12: qty 4

## 2024-01-12 MED ORDER — APIXABAN 5 MG PO TABS
5.0000 mg | ORAL_TABLET | Freq: Two times a day (BID) | ORAL | Status: DC
  Administered 2024-01-19: 5 mg via ORAL
  Filled 2024-01-12: qty 1

## 2024-01-12 MED ORDER — APIXABAN 5 MG PO TABS
10.0000 mg | ORAL_TABLET | Freq: Two times a day (BID) | ORAL | Status: AC
  Administered 2024-01-12 – 2024-01-18 (×14): 10 mg via ORAL
  Filled 2024-01-12 (×14): qty 2

## 2024-01-12 MED ORDER — ALBUMIN HUMAN 25 % IV SOLN
25.0000 g | Freq: Four times a day (QID) | INTRAVENOUS | Status: AC
  Administered 2024-01-12 – 2024-01-13 (×4): 25 g via INTRAVENOUS
  Filled 2024-01-12 (×4): qty 100

## 2024-01-12 MED ORDER — LOPERAMIDE HCL 2 MG PO CAPS
4.0000 mg | ORAL_CAPSULE | Freq: Three times a day (TID) | ORAL | Status: DC
  Administered 2024-01-12 – 2024-01-14 (×5): 4 mg via ORAL
  Filled 2024-01-12 (×5): qty 2

## 2024-01-12 NOTE — Progress Notes (Addendum)
 PHARMACY - ANTICOAGULATION CONSULT NOTE  Pharmacy Consult for heparin Indication: pulmonary embolus  No Known Allergies  Patient Measurements: Height: 5\' 8"  (172.7 cm) Weight: 112.1 kg (247 lb 1.6 oz) IBW/kg (Calculated) : 68.4 HEPARIN DW (KG): 94.3  Vital Signs: Temp: 99.9 F (37.7 C) (04/11 1121) Temp Source: Oral (04/11 1121) BP: 120/75 (04/11 1121) Pulse Rate: 93 (04/11 1121)  Labs: Recent Labs    01/09/24 1315 01/09/24 1524 01/09/24 2100 01/10/24 0508 01/11/24 0429 01/12/24 0412  HGB  --   --    < > 12.3* 10.0* 12.9*  HCT  --   --   --  37.2* 29.2* 38.6*  PLT  --   --   --  220 174 192  HEPARINUNFRC  --   --    < > 0.54 0.34 0.70  CREATININE  --   --   --  3.03* 3.79* 4.15*  TROPONINIHS 151* 168*  --   --   --   --    < > = values in this interval not displayed.    Estimated Creatinine Clearance: 25.9 mL/min (A) (by C-G formula based on SCr of 4.15 mg/dL (H)).  Assessment: 52 YOM presenting with HA/ cough and SOB, VQ scan positive for large segmental perfusion defect, he is not on anticoagulation PTA, CBC wnl. Heparin level therapeutic. No bleeding noted.   Goal of Therapy:  Heparin level 0.3-0.7 units/ml Monitor platelets by anticoagulation protocol: Yes   Plan:  Continue heparin infusion at 1650 units/hr Check heparin level daily while on heparin Continue to monitor H&H and platelets F/u transition to oral AC when able  Addendum: Patient stable, has remained therapeutic on heparin > 72 hours, now ok to transition to oral agent.  Stop IV heparin Apixaban 10 mg twice daily x7 days followed by apixaban 5 mg twice daily thereafter Continue to monitor H&H and platelets    Of note, copay for Eliquis Starter Pack for current 30 day co-pay is $694 due to a $4500 deductible, however patient qualifies for manufacturer coupon card   Thank you for allowing pharmacy to be a part of this patient's care.  Thelma Barge, PharmD, BCPS Clinical Pharmacist

## 2024-01-12 NOTE — Progress Notes (Signed)
 Subjective:  good UOP of 2300 last 24 hours on lasix-  BP normal to up -  BUN and crt up slightly -  he says he feels about the same-  did vomit this AM   Objective Vital signs in last 24 hours: Vitals:   01/12/24 0134 01/12/24 0340 01/12/24 0834 01/12/24 0851  BP:  (!) 167/90 (!) 164/98 (!) 164/98  Pulse:  (!) 101 88   Resp:  17 (!) 22   Temp:  98.8 F (37.1 C) 98.9 F (37.2 C)   TempSrc:  Oral Oral   SpO2: 96% 92% 100%   Weight:      Height:       Weight change:   Intake/Output Summary (Last 24 hours) at 01/12/2024 0935 Last data filed at 01/12/2024 0524 Gross per 24 hour  Intake 933.03 ml  Output 1880 ml  Net -946.97 ml    Assessment/ Plan: Pt is a 53 y.o. yo male with CKD who was admitted on 01/09/2024 with PE-   complication of A on CRF  Assessment/Plan: 1. Renal-  baseline CKD-  crt in the 2's with longstanding proteinuria-  likely from poorly controlled DM-  AKi in the setting of PE-  possibly some relative hypotension initially.  Is nonoliguric -  crt still rising but rate of rise slowing-  supportive care-  no indications for dialysis at present.   Prt/crt ratio over 9 grams likely chronic-  kidney sizes 11 cm with inc echogenicity-  no hydro 2. HTN/volume-  volume overloaded and third spacing due to hypoalbuminemia-  on iv lasix having good response-  no change in dose  3. Anemia-  not an issue at present  4. PE-  on anticoagulation  5. Hypoxia-  due to PS-  also PNA on abx-  CHF on lasix 6. Metabolic acidosis-  on sodium bicarb  7, hypokalemia-  on repletion     Cecille Aver    Labs: Basic Metabolic Panel: Recent Labs  Lab 01/10/24 0508 01/11/24 0429 01/12/24 0412  NA 134* 134* 135  K 3.5 3.9 3.3*  CL 104 102 105  CO2 19* 17* 19*  GLUCOSE 129* 150* 95  BUN 39* 46* 46*  CREATININE 3.03* 3.79* 4.15*  CALCIUM 7.7* 7.9* 8.5*  PHOS  --  3.5 4.2   Liver Function Tests: Recent Labs  Lab 01/09/24 0534 01/10/24 0508 01/11/24 0429 01/12/24 0412   AST 36 39  --   --   ALT 27 28  --   --   ALKPHOS 107 92  --   --   BILITOT 0.7 0.9  --   --   PROT 5.9* 5.4*  --   --   ALBUMIN 2.0* 1.7* 2.0* 1.8*   Recent Labs  Lab 01/09/24 0534  LIPASE 33   No results for input(s): "AMMONIA" in the last 168 hours. CBC: Recent Labs  Lab 01/09/24 0534 01/10/24 0508 01/11/24 0429 01/12/24 0412  WBC 6.6 6.3 6.8 6.3  NEUTROABS 4.9  --   --   --   HGB 13.9 12.3* 10.0* 12.9*  HCT 42.0 37.2* 29.2* 38.6*  MCV 89.9 88.2 87.4 86.0  PLT 246 220 174 192   Cardiac Enzymes: No results for input(s): "CKTOTAL", "CKMB", "CKMBINDEX", "TROPONINI" in the last 168 hours. CBG: Recent Labs  Lab 01/11/24 0555 01/11/24 0831 01/11/24 1141 01/11/24 1616 01/12/24 0643  GLUCAP 104* 139* 101* 125* 99    Iron Studies: No results for input(s): "IRON", "TIBC", "TRANSFERRIN", "FERRITIN" in  the last 72 hours. Studies/Results: US RENAL Result Date: 01/11/2024 CLINICAL DATA:  Acute renal insufficiency. EXAM: RENAL / URINARY TRACT ULTRASOUND COMPLETE COMPARISON:  CT abdomen pelvis dated 01/09/2024. FINDINGS: Right Kidney: Renal measurements: 11.2 x 6.1 x 5.3 cm = volume: 191 mL. Increased renal parenchymal echogenicity. No hydronephrosis or shadowing stone. Left Kidney: Renal measurements: 11.6 x 6.7 x 3.8 cm = volume: 154 mL. Increased renal parenchymal echogenicity. No hydronephrosis or shadowing stone. Bladder: Appears normal for degree of bladder distention. Other: None. IMPRESSION: Mildly echogenic kidneys likely related to medical renal disease. No hydronephrosis or shadowing stone. Electronically Signed   By: Elgie Collard M.D.   On: 01/11/2024 14:54   ECHOCARDIOGRAM COMPLETE BUBBLE STUDY Result Date: 01/10/2024    ECHOCARDIOGRAM REPORT   Patient Name:   Leroy Simmons Date of Exam: 01/10/2024 Medical Rec #:  [id number]      Height:       68.0 in Accession #:    [id number]     Weight:       253.0 lb Date of Birth:  Oct 27, 1970      BSA:          2.258 m Patient  Age:    52 years       BP:           148/72 mmHg Patient Gender: M              HR:           97 bpm. Exam Location:  Inpatient Procedure: 2D Echo, Cardiac Doppler, Color Doppler and Saline Contrast Bubble            Study (Both Spectral and Color Flow Doppler were utilized during            procedure). Indications:    Pulmonary Embolus                 Acute hypoxemic respiratory failure  History:        Patient has prior history of Echocardiogram examinations, most                 recent 01/19/2023. CHF and Cardiomegaly, Previous Myocardial                 Infarction and CAD, Pulmonary HTN and CKD, stage 4,                 Signs/Symptoms:Chest Pain; Risk Factors:Hypertension, Diabetes                 and Dyslipidemia.  Sonographer:    Lucendia Herrlich RCS Referring Phys: [phone] EKTA V PATEL IMPRESSIONS  1. Left ventricular ejection fraction, by estimation, is 60 to 65%. The left ventricle has normal function. The left ventricle has no regional wall motion abnormalities. There is mild left ventricular hypertrophy. Left ventricular diastolic parameters are indeterminate.  2. Right ventricular systolic function is normal. The right ventricular size is normal. Tricuspid regurgitation signal is inadequate for assessing PA pressure.  3. Left atrial size was mildly dilated.  4. The mitral valve is grossly normal. Trivial mitral valve regurgitation. No evidence of mitral stenosis.  5. The aortic valve is grossly normal. There is mild calcification of the aortic valve. Aortic valve regurgitation is not visualized. No aortic stenosis is present.  6. The inferior vena cava is normal in size with greater than 50% respiratory variability, suggesting right atrial pressure of 3 mmHg.  7. Agitated saline contrast bubble study was negative, with no  evidence of any interatrial shunt. Comparison(s): No significant change from prior study. FINDINGS  Left Ventricle: Left ventricular ejection fraction, by estimation, is 60 to 65%. The  left ventricle has normal function. The left ventricle has no regional wall motion abnormalities. The left ventricular internal cavity size was normal in size. There is  mild left ventricular hypertrophy. Left ventricular diastolic parameters are indeterminate. Right Ventricle: The right ventricular size is normal. Right vetricular wall thickness was not well visualized. Right ventricular systolic function is normal. Tricuspid regurgitation signal is inadequate for assessing PA pressure. Left Atrium: Left atrial size was mildly dilated. Right Atrium: Right atrial size was normal in size. Pericardium: Trivial pericardial effusion is present. Mitral Valve: The mitral valve is grossly normal. Trivial mitral valve regurgitation. No evidence of mitral valve stenosis. Tricuspid Valve: The tricuspid valve is grossly normal. Tricuspid valve regurgitation is trivial. No evidence of tricuspid stenosis. Aortic Valve: The aortic valve is grossly normal. There is mild calcification of the aortic valve. Aortic valve regurgitation is not visualized. No aortic stenosis is present. Aortic valve peak gradient measures 6.7 mmHg. Pulmonic Valve: The pulmonic valve was not well visualized. Pulmonic valve regurgitation is not visualized. No evidence of pulmonic stenosis. Aorta: The aortic root, ascending aorta, aortic arch and descending aorta are all structurally normal, with no evidence of dilitation or obstruction. Venous: The inferior vena cava is normal in size with greater than 50% respiratory variability, suggesting right atrial pressure of 3 mmHg. IAS/Shunts: The atrial septum is grossly normal. Agitated saline contrast was given intravenously to evaluate for intracardiac shunting. Agitated saline contrast bubble study was negative, with no evidence of any interatrial shunt.  LEFT VENTRICLE PLAX 2D LVIDd:         4.30 cm   Diastology LVIDs:         2.80 cm   LV e' medial:    7.89 cm/s LV PW:         1.30 cm   LV E/e' medial:   18.8 LV IVS:        0.90 cm   LV e' lateral:   9.20 cm/s LVOT diam:     2.30 cm   LV E/e' lateral: 16.1 LV SV:         72 LV SV Index:   32 LVOT Area:     4.15 cm  RIGHT VENTRICLE             IVC RV S prime:     13.70 cm/s  IVC diam: 1.70 cm TAPSE (M-mode): 2.3 cm LEFT ATRIUM             Index        RIGHT ATRIUM           Index LA diam:        3.70 cm 1.64 cm/m   RA Area:     19.30 cm LA Vol (A2C):   70.7 ml 31.30 ml/m  RA Volume:   55.40 ml  24.53 ml/m LA Vol (A4C):   67.7 ml 29.98 ml/m LA Biplane Vol: 70.9 ml 31.39 ml/m  AORTIC VALVE AV Area (Vmax): 3.49 cm AV Vmax:        129.00 cm/s AV Peak Grad:   6.7 mmHg LVOT Vmax:      108.40 cm/s LVOT Vmean:     72.420 cm/s LVOT VTI:       0.172 m  AORTA Ao Root diam: 3.10 cm Ao Asc diam:  3.50 cm MITRAL VALVE MV  Area (PHT): 5.02 cm     SHUNTS MV Decel Time: 151 msec     Systemic VTI:  0.17 m MV E velocity: 148.00 cm/s  Systemic Diam: 2.30 cm MV A velocity: 48.00 cm/s MV E/A ratio:  3.08 Jodelle Red MD Electronically signed by Jodelle Red MD Signature Date/Time: 01/10/2024/4:40:23 PM    Final    Medications: Infusions:  azithromycin 500 mg (01/11/24 1324)   cefTRIAXone (ROCEPHIN)  IV 2 g (01/12/24 0855)   heparin 1,650 Units/hr (01/12/24 0819)    Scheduled Medications:  amLODipine  10 mg Oral Daily   fluticasone  2 spray Each Nare Daily   furosemide  80 mg Intravenous Q12H   guaiFENesin  1,200 mg Oral BID   insulin aspart  0-9 Units Subcutaneous TID WC   insulin glargine-yfgn  20 Units Subcutaneous BID   loperamide  2 mg Oral Once   loratadine  10 mg Oral Daily   pantoprazole (PROTONIX) IV  40 mg Intravenous Q12H   potassium chloride  40 mEq Oral Once   sodium bicarbonate  650 mg Oral BID   sodium chloride flush  3 mL Intravenous Q12H   sucralfate  1 g Oral TID WC & HS   thiamine (VITAMIN B1) injection  100 mg Intravenous Daily    have reviewed scheduled and prn medications.  Physical Exam: General: NAD at present-  O2  sat in the high 80's Heart: slightly tachy Lungs: poor effort -  crackles R>L Abdomen: soft,  non tender Extremities: pitting edema     01/12/2024,9:35 AM  LOS: 2 days

## 2024-01-12 NOTE — Progress Notes (Signed)
.  Nurse requested Mobility Specialist to perform oxygen saturation test with pt which includes removing pt from oxygen both at rest and while ambulating.  Below are the results from that testing.     Patient Saturations on Room Air at Rest = spO2 84%  Patient Saturations on Room Air while Standing = sp02 84% .  Rested and performed pursed lip breathing for 1 minute with sp02 at 84%.  Patient Saturations on 2 Liters of oxygen while Standing = sp02 91%  At end of testing pt left in room on 2  Liters of oxygen.  Pt's activity limited by dizziness. Unable to ambulate in room.   Reported results to nurse.     Feliciana Rossetti Mobility Specialist Please contact via Special educational needs teacher or  Rehab office at [phone]

## 2024-01-12 NOTE — Progress Notes (Signed)
 Mobility Specialist Progress Note:    01/12/24 1059  Mobility  Activity Stood at bedside  Level of Assistance Contact guard assist, steadying assist  Assistive Device None  Activity Response Tolerated well  Mobility Referral Yes  Mobility visit 1 Mobility  Mobility Specialist Start Time (ACUTE ONLY) 1043  Mobility Specialist Stop Time (ACUTE ONLY) 1056  Mobility Specialist Time Calculation (min) (ACUTE ONLY) 13 min   Pt received asleep in bed, agreeable to mobility session. Took orthostatic BP's d/t pt feeling dizzy during yesterday's session. Pt able to dangle EOB and stand at bedside for a few minutes to take BP, as well as monitor SpO2. While at rest, SpO2 84% on RA. Increased O2 flow, after pursed lip breathing, SpO2 87% on 1L. Increased O2 flow again, SpO2 91% on 2L. Pt politely declined ambulating in room d/t feeling "disoriented." See BP below. Limited by dizziness. Returned pt to bed with all needs met. Notified RN of results.   Pre Mobility (Supine): 110/59 (75) Dangling EOB: 126/79 (94) Standing: 136/82 (96) Post Mobility (Supine): 109/61 (75)  Feliciana Rossetti Mobility Specialist Please contact via Special educational needs teacher or  Rehab office at [phone]

## 2024-01-12 NOTE — Progress Notes (Signed)
 PROGRESS NOTE    RALPHEAL ZAPPONE  ZOX:[id number] DOB: 1971/03/27 DOA: 01/09/2024 PCP: Grayce Sessions, NP    Chief Complaint  Patient presents with   Headache   Chills    Brief Narrative:  Patient 53 year old gentleman history of hypertension, CHF, diabetes type 2, CKD stage IIIb, anxiety/depression, history of DVT not on anticoagulation recurrent falls presenting with headaches, chills, cough, congestion, nausea, vomiting, diarrhea.  Patient seen in the ED noted to be in acute hypoxic respiratory failure requiring O2, VQ scan done concerning for PE, CT chest done concerning for multifocal pneumonia versus volume overload.  Patient placed on IV heparin and empiric IV antibiotics.  Due to patient's worsening renal function and ongoing volume overload, nephrology consulted.    Assessment & Plan:   Principal Problem:   Pulmonary embolism (HCC) Active Problems:   Acute on chronic diastolic CHF (congestive heart failure) (HCC)   NSTEMI (non-ST elevated myocardial infarction) (HCC)   Acute kidney injury superimposed on CKD (HCC)   Uncontrolled type 2 diabetes mellitus with hyperglycemia, with long-term current use of insulin (HCC)   Deep venous thrombosis (DVT) of left peroneal vein (HCC)   Anemia of chronic disease   Obesity (BMI 30-39.9)   Hypokalemia   Diabetes mellitus (HCC)   Anxiety and depression   Coronary artery disease involving native coronary artery of native heart without angina pectoris   CKD (chronic kidney disease) stage 4, GFR 15-29 ml/min (HCC)   Acute respiratory failure with hypoxia (HCC)  #1 acute hypoxic respiratory failure with hypoxia likely multifactorial secondary to acute PE/multifocal pneumonia versus acute diastolic CHF exacerbation. -Patient noted to present with cough, congestion, shortness of breath, noted to be hypoxic with sats of 86% on room air on presentation.  Patient history of DVT. -VQ scan done positive for large single segmental perfusion  defect within the right upper lobe compatible with PE. -Lower extremity Dopplers done negative for DVT. -CT chest without contrast done nodularity throughout both lungs with a basilar predominant distribution.  Favor multifocal pneumonia.  Smooth septal thickening suspicious for pulmonary edema.  Mild mediastinal adenopathy likely reactive.  Cardiomegaly with coronary artery calcifications and small pericardial effusion.  Dependent skin thickening with area of subcutaneous edema may represent cellulitis.  No obvious drainable collection in the absence of IV contrast. -2D echo with EF of 60 to 65%, and WMA, normal right ventricular systolic function, moderately elevated pulmonary artery systolic pressure.   -Patient with significant lower extremity edema on examination and diffuse crackles noted. -Patient also with hypoalbuminemia. -Urine Legionella antigen negative, urine pneumococcus antigen pending. -Was initially on IV vancomycin and IV Zosyn and has been transition to IV Rocephin and azithromycin due to worsening renal function.  -Discontinue azithromycin. -Patient volume overloaded on examination we will place on IV albumin every 6 hours x 24 hours to aid with diuresis.   -Transition from IV heparin to Eliquis.   -Continue Lasix 80 mg IV every 12 hours as recommended by nephrology.   -Strict I's and O's, daily weights.  2.  Right upper lobe PE -Patient with some clinical improvement.   -Hypoxia slowly improving.   -Patient has been on IV heparin now for 72 hours and therapeutic.  -Will transition to Eliquis.  -Patient with prior history of DVT, now with acute PE, will likely require lifetime anticoagulation. -Outpatient follow-up with hematology.  3.  Poorly controlled diabetes mellitus type 2 -Hemoglobin A1c 10.8 (09/09/2023). -Repeat hemoglobin A1c 9.3. -Tolerating current diet.  - CBG 99. -Per  med rec patient on Lantus 45 units twice daily and NovoLog meal coverage insulin 5 units 3  times daily. -Continue Semglee 20 units twice daily, SSI. -Diabetic coordinator following.  4.  Diarrhea -Likely secondary to viral gastroenteritis. -C. difficile PCR negative. -GI pathogen panel negative. -Patient still with watery loose stools and as such we will place on scheduled Imodium 4 g 3 times daily.  5.  Hypertension -Continue home regimen Norvasc 10 mg daily. -Patient started on IV Lasix and dose uptitrated.  6.  AKI on CKD stage IIIb/IV -Patient noted to be volume overloaded on examination which is worsened over the past 24 hours.   -Renal function trending up with diuresis.   -Renal ultrasound consistent with medical renal disease, negative for hydronephrosis or stones.  -Urine sodium of 57, urine creatinine of 64.   -Creatinine trending up currently at 4.15 from 2.62 on admission. -Creatinine noted to be 2.28 on 09/17/2023. -Patient volume overloaded on examination and currently on IV Lasix 80 mg every 12 hours. -Urine output of 2.380 L over the past 24 hours. -Patient seen in consultation by nephrology who are following and feel no indication for dialysis at this time and noted that patient has a protein creatinine ratio over 9 g likely chronic in nature. -Will place on IV albumin every 6 hours x 1 day to aid with diuresis. -Nephrology following and appreciate input and recommendations.  7.  Hypokalemia -Likely secondary to diuresis and GI losses. -Replete.   DVT prophylaxis: Heparin GTT>>>> Eliquis Code Status: Full Family Communication: Update me at bedside. Disposition: Likely home when clinically improved  Status is: Inpatient The patient will require care spanning > 2 midnights and should be moved to inpatient because: Severity of illness   Consultants:  Nephrology: Dr. Signe Colt 01/11/2024  Procedures:  CT chest without contrast 01/09/2024 CT abdomen and pelvis 01/09/2024 Chest x-ray 01/09/2024 VQ scan 01/09/2024 2D echo pending Lower extremity Dopplers  01/09/2024 Renal ultrasound 01/11/2024  Antimicrobials:  Anti-infectives (From admission, onward)    Start     Dose/Rate Route Frequency Ordered Stop   01/11/24 2200  vancomycin (VANCOREADY) IVPB 1750 mg/350 mL  Status:  Discontinued        1,750 mg 175 mL/hr over 120 Minutes Intravenous Every 48 hours 01/09/24 2351 01/11/24 1217   01/11/24 1300  cefTRIAXone (ROCEPHIN) 2 g in sodium chloride 0.9 % 100 mL IVPB        2 g 200 mL/hr over 30 Minutes Intravenous Daily 01/11/24 1217     01/11/24 1300  azithromycin (ZITHROMAX) 500 mg in sodium chloride 0.9 % 250 mL IVPB        500 mg 250 mL/hr over 60 Minutes Intravenous Daily 01/11/24 1217     01/10/24 0000  piperacillin-tazobactam (ZOSYN) IVPB 3.375 g  Status:  Discontinued        3.375 g 12.5 mL/hr over 240 Minutes Intravenous Every 8 hours 01/09/24 2347 01/11/24 1217   01/10/24 0000  vancomycin (VANCOREADY) IVPB 2000 mg/400 mL        2,000 mg 200 mL/hr over 120 Minutes Intravenous  Once 01/09/24 2348 01/10/24 0239   01/09/24 1200  cefTRIAXone (ROCEPHIN) 1 g in sodium chloride 0.9 % 100 mL IVPB        1 g 200 mL/hr over 30 Minutes Intravenous  Once 01/09/24 1154 01/09/24 1233   01/09/24 1200  azithromycin (ZITHROMAX) 500 mg in sodium chloride 0.9 % 250 mL IVPB        500 mg 250  mL/hr over 60 Minutes Intravenous  Once 01/09/24 1154 01/09/24 1504         Subjective: Patient sleeping but easily arousable.  Feeling fatigued.  Still with diarrhea.  Feels shortness of breath is slowly improving.  Denies any chest pain.  Some complaints with dizziness when moving around.  Objective: Vitals:   01/12/24 0834 01/12/24 0851 01/12/24 1121 01/12/24 1548  BP: (!) 164/98 (!) 164/98 120/75 106/69  Pulse: 88  93 86  Resp: (!) 22  (!) 23 (!) 23  Temp: 98.9 F (37.2 C)  99.9 F (37.7 C) 98.7 F (37.1 C)  TempSrc: Oral  Oral Oral  SpO2: 100%  93% 95%  Weight:      Height:        Intake/Output Summary (Last 24 hours) at 01/12/2024 1929 Last  data filed at 01/12/2024 1830 Gross per 24 hour  Intake 1589.06 ml  Output 5530 ml  Net -3940.94 ml   Filed Weights   01/09/24 0519 01/11/24 0316  Weight: 114.8 kg 112.1 kg    Examination:  General exam: NAD. Respiratory system: Bibasilar crackles.  No wheezing.  Fair air movement.  On 3 L nasal cannula.  Cardiovascular system: RRR no murmurs rubs or gallops.  2-3+ bilateral lower extremity edema.  Gastrointestinal system: Abdomen is soft, nontender, nondistended, positive bowel sounds.  No rebound.  No guarding. Central nervous system: Alert and oriented. No focal neurological deficits. Extremities: 2-3+ bilateral lower extremity edema.   Skin: No rashes, lesions or ulcers Psychiatry: Judgement and insight appear normal. Mood & affect appropriate.     Data Reviewed: I have personally reviewed following labs and imaging studies  CBC: Recent Labs  Lab 01/09/24 0534 01/10/24 0508 01/11/24 0429 01/12/24 0412  WBC 6.6 6.3 6.8 6.3  NEUTROABS 4.9  --   --   --   HGB 13.9 12.3* 10.0* 12.9*  HCT 42.0 37.2* 29.2* 38.6*  MCV 89.9 88.2 87.4 86.0  PLT 246 220 174 192    Basic Metabolic Panel: Recent Labs  Lab 01/09/24 0534 01/10/24 0508 01/11/24 0429 01/12/24 0412  NA 134* 134* 134* 135  K 4.1 3.5 3.9 3.3*  CL 104 104 102 105  CO2 19* 19* 17* 19*  GLUCOSE 199* 129* 150* 95  BUN 35* 39* 46* 46*  CREATININE 2.62* 3.03* 3.79* 4.15*  CALCIUM 8.2* 7.7* 7.9* 8.5*  PHOS  --   --  3.5 4.2    GFR: Estimated Creatinine Clearance: 25.9 mL/min (A) (by C-G formula based on SCr of 4.15 mg/dL (H)).  Liver Function Tests: Recent Labs  Lab 01/09/24 0534 01/10/24 0508 01/11/24 0429 01/12/24 0412  AST 36 39  --   --   ALT 27 28  --   --   ALKPHOS 107 92  --   --   BILITOT 0.7 0.9  --   --   PROT 5.9* 5.4*  --   --   ALBUMIN 2.0* 1.7* 2.0* 1.8*    CBG: Recent Labs  Lab 01/11/24 1141 01/11/24 1616 01/12/24 0643 01/12/24 1119 01/12/24 1620  GLUCAP 101* 125* 99 135*  119*     Recent Results (from the past 240 hours)  Resp panel by RT-PCR (RSV, Flu A&B, Covid) Anterior Nasal Swab     Status: None   Collection Time: 01/09/24  5:23 AM   Specimen: Anterior Nasal Swab  Result Value Ref Range Status   SARS Coronavirus 2 by RT PCR NEGATIVE NEGATIVE Final   Influenza A by PCR  NEGATIVE NEGATIVE Final   Influenza B by PCR NEGATIVE NEGATIVE Final    Comment: (NOTE) The Xpert Xpress SARS-CoV-2/FLU/RSV plus assay is intended as an aid in the diagnosis of influenza from Nasopharyngeal swab specimens and should not be used as a sole basis for treatment. Nasal washings and aspirates are unacceptable for Xpert Xpress SARS-CoV-2/FLU/RSV testing.  Fact Sheet for Patients: BloggerCourse.com  Fact Sheet for Healthcare Providers: SeriousBroker.it  This test is not yet approved or cleared by the [place] FDA and has been authorized for detection and/or diagnosis of SARS-CoV-2 by FDA under an Emergency Use Authorization (EUA). This EUA will remain in effect (meaning this test can be used) for the duration of the COVID-19 declaration under Section 564(b)(1) of the Act, 21 U.S.C. section 360bbb-3(b)(1), unless the authorization is terminated or revoked.     Resp Syncytial Virus by PCR NEGATIVE NEGATIVE Final    Comment: (NOTE) Fact Sheet for Patients: BloggerCourse.com  Fact Sheet for Healthcare Providers: SeriousBroker.it  This test is not yet approved or cleared by the [place] FDA and has been authorized for detection and/or diagnosis of SARS-CoV-2 by FDA under an Emergency Use Authorization (EUA). This EUA will remain in effect (meaning this test can be used) for the duration of the COVID-19 declaration under Section 564(b)(1) of the Act, 21 U.S.C. section 360bbb-3(b)(1), unless the authorization is terminated or revoked.  Performed at [facility], [address] [place], [place] [address]   Culture, blood (routine x 2)     Status: None (Preliminary result)   Collection Time: 01/09/24  8:25 AM   Specimen: BLOOD LEFT FOREARM  Result Value Ref Range Status   Specimen Description BLOOD LEFT FOREARM  Final   Special Requests   Final    BOTTLES DRAWN AEROBIC AND ANAEROBIC Blood Culture results may not be optimal due to an inadequate volume of blood received in culture bottles   Culture   Final    NO GROWTH 3 DAYS Performed at [facility], [address], [place], [place] [address]    Report Status PENDING  Incomplete  Culture, blood (routine x 2)     Status: None (Preliminary result)   Collection Time: 01/09/24  9:43 AM   Specimen: BLOOD LEFT HAND  Result Value Ref Range Status   Specimen Description BLOOD LEFT HAND  Final   Special Requests   Final    BOTTLES DRAWN AEROBIC AND ANAEROBIC Blood Culture results may not be optimal due to an inadequate volume of blood received in culture bottles   Culture   Final    NO GROWTH 3 DAYS Performed at [facility], [address], [place], [place] [address]    Report Status PENDING  Incomplete  Gastrointestinal Panel by PCR , Stool     Status: None   Collection Time: 01/09/24  5:03 PM   Specimen: Stool  Result Value Ref Range Status   Campylobacter species NOT DETECTED NOT DETECTED Final   Plesimonas shigelloides NOT DETECTED NOT DETECTED Final   Salmonella species NOT DETECTED NOT DETECTED Final   Yersinia enterocolitica NOT DETECTED NOT DETECTED Final   Vibrio species NOT DETECTED NOT DETECTED Final   Vibrio cholerae NOT DETECTED NOT DETECTED Final   Enteroaggregative E coli (EAEC) NOT DETECTED NOT DETECTED Final   Enteropathogenic E coli (EPEC) NOT DETECTED NOT DETECTED Final   Enterotoxigenic E coli (ETEC) NOT DETECTED NOT DETECTED Final   Shiga like toxin producing E coli (STEC)  NOT DETECTED NOT DETECTED Final   Shigella/Enteroinvasive E coli (EIEC)  NOT DETECTED NOT DETECTED Final   Cryptosporidium NOT DETECTED NOT DETECTED Final   Cyclospora cayetanensis NOT DETECTED NOT DETECTED Final   Entamoeba histolytica NOT DETECTED NOT DETECTED Final   Giardia lamblia NOT DETECTED NOT DETECTED Final   Adenovirus F40/41 NOT DETECTED NOT DETECTED Final   Astrovirus NOT DETECTED NOT DETECTED Final   Norovirus GI/GII NOT DETECTED NOT DETECTED Final   Rotavirus A NOT DETECTED NOT DETECTED Final   Sapovirus (I, II, IV, and V) NOT DETECTED NOT DETECTED Final    Comment: Performed at [facility], [address] [place], [place] [address]  C Difficile Quick Screen w PCR reflex     Status: None   Collection Time: 01/09/24  5:03 PM   Specimen: Stool  Result Value Ref Range Status   C Diff antigen NEGATIVE NEGATIVE Final   C Diff toxin NEGATIVE NEGATIVE Final   C Diff interpretation No C. difficile detected.  Final    Comment: Performed at [facility], [address] [place], [place] [address]  MRSA Next Gen by PCR, Nasal     Status: None   Collection Time: 01/11/24  8:47 AM   Specimen: Nasal Mucosa; Nasal Swab  Result Value Ref Range Status   MRSA by PCR Next Gen NOT DETECTED NOT DETECTED Final    Comment: (NOTE) The GeneXpert MRSA Assay (FDA approved for NASAL specimens only), is one component of a comprehensive MRSA colonization surveillance program. It is not intended to diagnose MRSA infection nor to guide or monitor treatment for MRSA infections. Test performance is not FDA approved in patients less than 40 years old. Performed at [facility], [address], [place], [place] [address]          Radiology Studies: US RENAL Result Date: 01/11/2024 CLINICAL DATA:  Acute renal insufficiency. EXAM: RENAL / URINARY TRACT ULTRASOUND COMPLETE COMPARISON:  CT abdomen pelvis dated 01/09/2024. FINDINGS: Right Kidney: Renal measurements: 11.2 x 6.1 x 5.3 cm = volume: 191 mL. Increased renal parenchymal echogenicity. No  hydronephrosis or shadowing stone. Left Kidney: Renal measurements: 11.6 x 6.7 x 3.8 cm = volume: 154 mL. Increased renal parenchymal echogenicity. No hydronephrosis or shadowing stone. Bladder: Appears normal for degree of bladder distention. Other: None. IMPRESSION: Mildly echogenic kidneys likely related to medical renal disease. No hydronephrosis or shadowing stone. Electronically Signed   By: Elgie Collard M.D.   On: 01/11/2024 14:54        Scheduled Meds:  amLODipine  10 mg Oral Daily   apixaban  10 mg Oral BID   Followed by   Melene Muller ON 01/19/2024] apixaban  5 mg Oral BID   fluticasone  2 spray Each Nare Daily   furosemide  80 mg Intravenous Q12H   guaiFENesin  1,200 mg Oral BID   insulin aspart  0-9 Units Subcutaneous TID WC   insulin glargine-yfgn  20 Units Subcutaneous BID   loperamide  2 mg Oral Once   loperamide  4 mg Oral TID   loratadine  10 mg Oral Daily   pantoprazole (PROTONIX) IV  40 mg Intravenous Q12H   sodium bicarbonate  650 mg Oral BID   sodium chloride flush  3 mL Intravenous Q12H   sucralfate  1 g Oral TID WC & HS   thiamine (VITAMIN B1) injection  100 mg Intravenous Daily   Continuous Infusions:  albumin human 25 g (01/12/24 1852)  azithromycin 500 mg (01/12/24 0956)   cefTRIAXone (ROCEPHIN)  IV 2 g (01/12/24 0855)     LOS: 2 days    Time spent: 40 mins    Ramiro Harvest, MD [facility]   To contact the attending provider between 7A-7P or the covering provider during after hours 7P-7A, please log into the web site [email or web address] and access using universal [place] password for that web site. If you do not have the password, please call the hospital operator.  01/12/2024, 7:29 PM

## 2024-01-12 NOTE — Inpatient Diabetes Management (Addendum)
 Inpatient Diabetes Program Recommendations  AACE/ADA: New Consensus Statement on Inpatient Glycemic Control (2015)  Target Ranges:  Prepandial:   less than 140 mg/dL      Peak postprandial:   less than 180 mg/dL (1-2 hours)      Critically ill patients:  140 - 180 mg/dL   Lab Results  Component Value Date   GLUCAP 135 (H) 01/12/2024   HGBA1C 9.3 (H) 01/11/2024    Review of Glycemic Control  Latest Reference Range & Units 01/11/24 08:31 01/11/24 11:41 01/11/24 16:16 01/12/24 06:43 01/12/24 11:19  Glucose-Capillary 70 - 99 mg/dL 161 (H) 096 (H) 045 (H) 99 135 (H)  (H): Data is abnormally high  Diabetes history: DM2 Outpatient Diabetes medications: Lantus 22 units BID, Novolog 5 units TID Current orders for Inpatient glycemic control: Semglee 20 units BID, Novolog 0-9 units TID  Met with patient at bedside.  Reviewed patient's current A1c of 9.3% (average BG of 220 mg/dL). Explained what a A1c is and what it measures. Also reviewed goal A1c with patient, importance of good glucose control @ home, and blood sugar goals.  CBG's are trending well but he is not taking in very much food.    He is current with the [facility] for PCP.  He recently reached out to them telling them his Admelog  (rapid insulin) was not covered.  They changed him to Novolog.  He tells me his Novolog and Lantus $103.83 each.  Asked him to speak with his PCP at [facility] and ask for insulin assistance.  The Clinic may be able to help with the cost.  He has [employer] which has a very high deductible.  Encouraged him to apply for Medicaid as well.  Per CMRN, he uses [facility] for his pharmacy.    He really can't tell me exactly how he is taking his insulins and doses.  Not sure if he is consistent with them as prescribed.    He does not have a glucometer and therefore does not check his CBGs.  I provided him with a [employer] glucometer.  He can obtain more supplies at [facility] when he runs out of strips or  lancets.  Asked him to check his CBGs ac/hs.    Educated on The Plate Method, CHO's, portion control, CBGs at home fasting and mid afternoon, F/U with PCP every 3 months, bring meter to PCP office, long and short term complications of uncontrolled BG, and importance of exercise.  Reviewed hypoglycemia, < 70 mg/dL, signs, symptoms and treatments.   Will continue to follow while inpatient.  Thank you, Dulce Sellar, MSN, CDCES Diabetes Coordinator Inpatient Diabetes Program [phone] (team pager from 8a-5p)

## 2024-01-12 NOTE — Plan of Care (Signed)
  Problem: Coping: Goal: Ability to adjust to condition or change in health will improve Outcome: Progressing   Problem: Nutritional: Goal: Maintenance of adequate nutrition will improve Outcome: Progressing   Problem: Skin Integrity: Goal: Risk for impaired skin integrity will decrease Outcome: Progressing   Problem: Tissue Perfusion: Goal: Adequacy of tissue perfusion will improve Outcome: Progressing   Problem: Clinical Measurements: Goal: Ability to maintain clinical measurements within normal limits will improve Outcome: Progressing Goal: Respiratory complications will improve Outcome: Progressing Goal: Cardiovascular complication will be avoided Outcome: Progressing   Problem: Activity: Goal: Risk for activity intolerance will decrease Outcome: Progressing   Problem: Coping: Goal: Level of anxiety will decrease Outcome: Progressing   Problem: Elimination: Goal: Will not experience complications related to urinary retention Outcome: Progressing   Problem: Pain Managment: Goal: General experience of comfort will improve and/or be controlled Outcome: Progressing   Problem: Safety: Goal: Ability to remain free from injury will improve Outcome: Progressing   Problem: Skin Integrity: Goal: Risk for impaired skin integrity will decrease Outcome: Progressing

## 2024-01-12 NOTE — Discharge Instructions (Signed)
 Information on my medicine - ELIQUIS (apixaban)  This medication education was reviewed with me or my healthcare representative as part of my discharge preparation.    Why was Eliquis prescribed for you? Eliquis was prescribed to treat blood clots that may have been found in the veins of your legs (deep vein thrombosis) or in your lungs (pulmonary embolism) and to reduce the risk of them occurring again.  What do You need to know about Eliquis ? The starting dose is 10 mg (two 5 mg tablets) taken TWICE daily for the FIRST SEVEN (7) DAYS, then on 01/19/2024  the dose is reduced to ONE 5 mg tablet taken TWICE daily.  Eliquis may be taken with or without food.   Try to take the dose about the same time in the morning and in the evening. If you have difficulty swallowing the tablet whole please discuss with your pharmacist how to take the medication safely.  Take Eliquis exactly as prescribed and DO NOT stop taking Eliquis without talking to the doctor who prescribed the medication.  Stopping may increase your risk of developing a new blood clot.  Refill your prescription before you run out.  After discharge, you should have regular check-up appointments with your healthcare provider that is prescribing your Eliquis.    What do you do if you miss a dose? If a dose of ELIQUIS is not taken at the scheduled time, take it as soon as possible on the same day and twice-daily administration should be resumed. The dose should not be doubled to make up for a missed dose.  Important Safety Information A possible side effect of Eliquis is bleeding. You should call your healthcare provider right away if you experience any of the following: Bleeding from an injury or your nose that does not stop. Unusual colored urine (red or dark brown) or unusual colored stools (red or black). Unusual bruising for unknown reasons. A serious fall or if you hit your head (even if there is no bleeding).  Some  medicines may interact with Eliquis and might increase your risk of bleeding or clotting while on Eliquis. To help avoid this, consult your healthcare provider or pharmacist prior to using any new prescription or non-prescription medications, including herbals, vitamins, non-steroidal anti-inflammatory drugs (NSAIDs) and supplements.  This website has more information on Eliquis (apixaban): http://[email or web address].com/eliquis/home

## 2024-01-12 NOTE — Telephone Encounter (Signed)
 Patient Product/process development scientist completed.    The patient is insured through [facility]. Patient has [employer], may use a copay card, and/or apply for patient assistance if available.    Ran test claim for Eliquis Starter Pack and the current 30 day co-pay is [id number] due to a $4500 deductible.  Ran test claim for Xarelto Starter Pack and the current 30 day co-pay is $943.87 due to a $4500 deductible.  This test claim was processed through [facility]- copay amounts may vary at other pharmacies due to pharmacy/plan contracts, or as the patient moves through the different stages of their insurance plan.     Roland Earl, CPHT Pharmacy Technician III Certified Patient Advocate [facility] Patient Advocate Team Direct Number: [phone]  Fax: [phone]

## 2024-01-13 DIAGNOSIS — I2699 Other pulmonary embolism without acute cor pulmonale: Secondary | ICD-10-CM | POA: Diagnosis not present

## 2024-01-13 DIAGNOSIS — J9601 Acute respiratory failure with hypoxia: Secondary | ICD-10-CM | POA: Diagnosis not present

## 2024-01-13 DIAGNOSIS — D638 Anemia in other chronic diseases classified elsewhere: Secondary | ICD-10-CM | POA: Diagnosis not present

## 2024-01-13 DIAGNOSIS — I5033 Acute on chronic diastolic (congestive) heart failure: Secondary | ICD-10-CM | POA: Diagnosis not present

## 2024-01-13 LAB — RENAL FUNCTION PANEL
Albumin: 2.2 g/dL — ABNORMAL LOW (ref 3.5–5.0)
Anion gap: 12 (ref 5–15)
BUN: 49 mg/dL — ABNORMAL HIGH (ref 6–20)
CO2: 20 mmol/L — ABNORMAL LOW (ref 22–32)
Calcium: 8.1 mg/dL — ABNORMAL LOW (ref 8.9–10.3)
Chloride: 103 mmol/L (ref 98–111)
Creatinine, Ser: 4.43 mg/dL — ABNORMAL HIGH (ref 0.61–1.24)
GFR, Estimated: 15 mL/min — ABNORMAL LOW (ref >60)
Glucose, Bld: 104 mg/dL — ABNORMAL HIGH (ref 70–99)
Phosphorus: 3.9 mg/dL (ref 2.5–4.6)
Potassium: 3.4 mmol/L — ABNORMAL LOW (ref 3.5–5.1)
Sodium: 135 mmol/L (ref 135–145)

## 2024-01-13 LAB — CBC
HCT: 32.6 % — ABNORMAL LOW (ref 39.0–52.0)
Hemoglobin: 11.3 g/dL — ABNORMAL LOW (ref 13.0–17.0)
MCH: 29.6 pg (ref 26.0–34.0)
MCHC: 34.7 g/dL (ref 30.0–36.0)
MCV: 85.3 fL (ref 80.0–100.0)
Platelets: 194 10*3/uL (ref 150–400)
RBC: 3.82 MIL/uL — ABNORMAL LOW (ref 4.22–5.81)
RDW: 12.6 % (ref 11.5–15.5)
WBC: 5.5 10*3/uL (ref 4.0–10.5)
nRBC: 0 % (ref 0.0–0.2)

## 2024-01-13 LAB — GLUCOSE, CAPILLARY
Glucose-Capillary: 87 mg/dL (ref 70–99)
Glucose-Capillary: 127 mg/dL — ABNORMAL HIGH (ref 70–99)
Glucose-Capillary: 147 mg/dL — ABNORMAL HIGH (ref 70–99)
Glucose-Capillary: 210 mg/dL — ABNORMAL HIGH (ref 70–99)

## 2024-01-13 MED ORDER — PANTOPRAZOLE SODIUM 40 MG PO TBEC
40.0000 mg | DELAYED_RELEASE_TABLET | Freq: Two times a day (BID) | ORAL | Status: DC
  Administered 2024-01-13 – 2024-01-19 (×11): 40 mg via ORAL
  Filled 2024-01-13 (×12): qty 1

## 2024-01-13 MED ORDER — THIAMINE MONONITRATE 100 MG PO TABS
100.0000 mg | ORAL_TABLET | Freq: Every day | ORAL | Status: DC
  Administered 2024-01-14 – 2024-01-19 (×5): 100 mg via ORAL
  Filled 2024-01-13 (×6): qty 1

## 2024-01-13 MED ORDER — POTASSIUM CHLORIDE CRYS ER 20 MEQ PO TBCR
40.0000 meq | EXTENDED_RELEASE_TABLET | Freq: Once | ORAL | Status: AC
Start: 2024-01-13 — End: 2024-01-13
  Administered 2024-01-13: 40 meq via ORAL
  Filled 2024-01-13: qty 2

## 2024-01-13 NOTE — Plan of Care (Signed)
  Problem: Coping: Goal: Ability to adjust to condition or change in health will improve Outcome: Progressing   Problem: Health Behavior/Discharge Planning: Goal: Ability to manage health-related needs will improve Outcome: Progressing   Problem: Metabolic: Goal: Ability to maintain appropriate glucose levels will improve Outcome: Progressing   Problem: Nutritional: Goal: Maintenance of adequate nutrition will improve Outcome: Progressing   Problem: Skin Integrity: Goal: Risk for impaired skin integrity will decrease Outcome: Progressing   Problem: Tissue Perfusion: Goal: Adequacy of tissue perfusion will improve Outcome: Progressing   Problem: Health Behavior/Discharge Planning: Goal: Ability to manage health-related needs will improve Outcome: Progressing   Problem: Clinical Measurements: Goal: Ability to maintain clinical measurements within normal limits will improve Outcome: Progressing Goal: Diagnostic test results will improve Outcome: Progressing Goal: Respiratory complications will improve Outcome: Progressing Goal: Cardiovascular complication will be avoided Outcome: Progressing   Problem: Activity: Goal: Risk for activity intolerance will decrease Outcome: Progressing   Problem: Nutrition: Goal: Adequate nutrition will be maintained Outcome: Progressing   Problem: Coping: Goal: Level of anxiety will decrease Outcome: Progressing   Problem: Elimination: Goal: Will not experience complications related to bowel motility Outcome: Progressing Goal: Will not experience complications related to urinary retention Outcome: Progressing   Problem: Pain Managment: Goal: General experience of comfort will improve and/or be controlled Outcome: Progressing   Problem: Safety: Goal: Ability to remain free from injury will improve Outcome: Progressing   Problem: Skin Integrity: Goal: Risk for impaired skin integrity will decrease Outcome: Progressing    Problem: Activity: Goal: Ability to tolerate increased activity will improve Outcome: Progressing   Problem: Clinical Measurements: Goal: Ability to maintain a body temperature in the normal range will improve Outcome: Progressing   Problem: Respiratory: Goal: Ability to maintain a clear airway will improve Outcome: Progressing

## 2024-01-13 NOTE — Progress Notes (Signed)
 Subjective:  great  UOP of 4800 last 24 hours on lasix-  BP normal to up -  BUN and crt up again slightly -  he says he feels about the same-   Objective Vital signs in last 24 hours: Vitals:   01/12/24 2322 01/13/24 0326 01/13/24 0654 01/13/24 0842  BP: (!) 154/90 (!) 140/87  (!) 147/87  Pulse: 87 89 (!) 101 88  Resp: 20 20 (!) 28 20  Temp: 98.8 F (37.1 C) 98.9 F (37.2 C)  98.2 F (36.8 C)  TempSrc: Oral Oral  Oral  SpO2: 96% 97%    Weight:   112.1 kg   Height:       Weight change:   Intake/Output Summary (Last 24 hours) at 01/13/2024 1610 Last data filed at 01/13/2024 0400 Gross per 24 hour  Intake 1280.17 ml  Output 2201 ml  Net -920.83 ml    Assessment/ Plan: Pt is a 53 y.o. yo male with CKD who was admitted on 01/09/2024 with PE-   complication of A on CRF  Assessment/Plan: 1. Renal-  baseline CKD-  crt in the 2's with longstanding proteinuria-  likely from poorly controlled DM-  AKi in the setting of PE-  possibly some relative hypotension initially.  Is nonoliguric -  crt still rising but rate of rise slowing-  supportive care-  no indications for dialysis at present.   Prt/crt ratio over 9 grams likely chronic-  kidney sizes 11 cm with inc echogenicity-  no hydro 2. HTN/volume-  volume overloaded and third spacing due to hypoalbuminemia-  on iv lasix having good response-  no change in dose  3. Anemia-  not an issue at present  4. PE-  on anticoagulation  5. Hypoxia-  due to PE-  also PNA on abx-  CHF on lasix 6. Metabolic acidosis-  on sodium bicarb  7, hypokalemia-  on repletion     Leroy Simmons    Labs: Basic Metabolic Panel: Recent Labs  Lab 01/11/24 0429 01/12/24 0412 01/13/24 0449  NA 134* 135 135  K 3.9 3.3* 3.4*  CL 102 105 103  CO2 17* 19* 20*  GLUCOSE 150* 95 104*  BUN 46* 46* 49*  CREATININE 3.79* 4.15* 4.43*  CALCIUM 7.9* 8.5* 8.1*  PHOS 3.5 4.2 3.9   Liver Function Tests: Recent Labs  Lab 01/09/24 0534 01/10/24 0508  01/11/24 0429 01/12/24 0412 01/13/24 0449  AST 36 39  --   --   --   ALT 27 28  --   --   --   ALKPHOS 107 92  --   --   --   BILITOT 0.7 0.9  --   --   --   PROT 5.9* 5.4*  --   --   --   ALBUMIN 2.0* 1.7* 2.0* 1.8* 2.2*   Recent Labs  Lab 01/09/24 0534  LIPASE 33   No results for input(s): "AMMONIA" in the last 168 hours. CBC: Recent Labs  Lab 01/09/24 0534 01/10/24 0508 01/11/24 0429 01/12/24 0412 01/13/24 0449  WBC 6.6 6.3 6.8 6.3 5.5  NEUTROABS 4.9  --   --   --   --   HGB 13.9 12.3* 10.0* 12.9* 11.3*  HCT 42.0 37.2* 29.2* 38.6* 32.6*  MCV 89.9 88.2 87.4 86.0 85.3  PLT 246 220 174 192 194   Cardiac Enzymes: No results for input(s): "CKTOTAL", "CKMB", "CKMBINDEX", "TROPONINI" in the last 168 hours. CBG: Recent Labs  Lab 01/12/24 [phone] 01/12/24 1119  01/12/24 1620 01/12/24 2110 01/13/24 0620  GLUCAP 99 135* 119* 153* 87    Iron Studies: No results for input(s): "IRON", "TIBC", "TRANSFERRIN", "FERRITIN" in the last 72 hours. Studies/Results: US  RENAL Result Date: 01/11/2024 CLINICAL DATA:  Acute renal insufficiency. EXAM: RENAL / URINARY TRACT ULTRASOUND COMPLETE COMPARISON:  CT abdomen pelvis dated 01/09/2024. FINDINGS: Right Kidney: Renal measurements: 11.2 x 6.1 x 5.3 cm = volume: 191 mL. Increased renal parenchymal echogenicity. No hydronephrosis or shadowing stone. Left Kidney: Renal measurements: 11.6 x 6.7 x 3.8 cm = volume: 154 mL. Increased renal parenchymal echogenicity. No hydronephrosis or shadowing stone. Bladder: Appears normal for degree of bladder distention. Other: None. IMPRESSION: Mildly echogenic kidneys likely related to medical renal disease. No hydronephrosis or shadowing stone. Electronically Signed   By: Angus Bark M.D.   On: 01/11/2024 14:54   Medications: Infusions:  albumin human 25 g (01/13/24 0100)   cefTRIAXone (ROCEPHIN)  IV 2 g (01/12/24 0855)    Scheduled Medications:  amLODipine  10 mg Oral Daily   apixaban  10 mg Oral  BID   Followed by   Cecily Cohen ON 01/19/2024] apixaban  5 mg Oral BID   fluticasone  2 spray Each Nare Daily   furosemide  80 mg Intravenous Q12H   guaiFENesin  1,200 mg Oral BID   insulin aspart  0-9 Units Subcutaneous TID WC   insulin glargine-yfgn  20 Units Subcutaneous BID   loperamide  2 mg Oral Once   loperamide  4 mg Oral TID   loratadine  10 mg Oral Daily   pantoprazole (PROTONIX) IV  40 mg Intravenous Q12H   potassium chloride  40 mEq Oral Once   sodium bicarbonate  650 mg Oral BID   sodium chloride flush  3 mL Intravenous Q12H   sucralfate  1 g Oral TID WC & HS   thiamine (VITAMIN B1) injection  100 mg Intravenous Daily    have reviewed scheduled and prn medications.  Physical Exam: General: NAD at present-  O2 sat better  Heart: slightly tachy Lungs: poor effort -  crackles R>L Abdomen: soft,  non tender Extremities: pitting edema     01/13/2024,9:42 AM  LOS: 3 days

## 2024-01-13 NOTE — Plan of Care (Signed)
  Problem: Education: Goal: Ability to describe self-care measures that may prevent or decrease complications (Diabetes Survival Skills Education) will improve Outcome: Progressing Goal: Individualized Educational Video(s) Outcome: Progressing   Problem: Coping: Goal: Ability to adjust to condition or change in health will improve Outcome: Progressing   Problem: Health Behavior/Discharge Planning: Goal: Ability to identify and utilize available resources and services will improve Outcome: Progressing Goal: Ability to manage health-related needs will improve Outcome: Progressing   Problem: Metabolic: Goal: Ability to maintain appropriate glucose levels will improve Outcome: Progressing   Problem: Nutritional: Goal: Maintenance of adequate nutrition will improve Outcome: Progressing Goal: Progress toward achieving an optimal weight will improve Outcome: Progressing   Problem: Skin Integrity: Goal: Risk for impaired skin integrity will decrease Outcome: Progressing   Problem: Tissue Perfusion: Goal: Adequacy of tissue perfusion will improve Outcome: Progressing   Problem: Education: Goal: Knowledge of General Education information will improve Description: Including pain rating scale, medication(s)/side effects and non-pharmacologic comfort measures Outcome: Progressing   Problem: Health Behavior/Discharge Planning: Goal: Ability to manage health-related needs will improve Outcome: Progressing   Problem: Clinical Measurements: Goal: Ability to maintain clinical measurements within normal limits will improve Outcome: Progressing Goal: Will remain free from infection Outcome: Progressing Goal: Diagnostic test results will improve Outcome: Progressing Goal: Respiratory complications will improve Outcome: Progressing Goal: Cardiovascular complication will be avoided Outcome: Progressing   Problem: Activity: Goal: Risk for activity intolerance will decrease Outcome:  Progressing   Problem: Nutrition: Goal: Adequate nutrition will be maintained Outcome: Progressing   Problem: Coping: Goal: Level of anxiety will decrease Outcome: Progressing   Problem: Elimination: Goal: Will not experience complications related to bowel motility Outcome: Progressing Goal: Will not experience complications related to urinary retention Outcome: Progressing   Problem: Pain Managment: Goal: General experience of comfort will improve and/or be controlled Outcome: Progressing   Problem: Safety: Goal: Ability to remain free from injury will improve Outcome: Progressing   Problem: Skin Integrity: Goal: Risk for impaired skin integrity will decrease Outcome: Progressing   Problem: Activity: Goal: Ability to tolerate increased activity will improve Outcome: Progressing   Problem: Clinical Measurements: Goal: Ability to maintain a body temperature in the normal range will improve Outcome: Progressing   Problem: Respiratory: Goal: Ability to maintain adequate ventilation will improve Outcome: Progressing Goal: Ability to maintain a clear airway will improve Outcome: Progressing

## 2024-01-13 NOTE — Progress Notes (Signed)
 PROGRESS NOTE    Leroy Simmons  ZOX:[id number] DOB: 06/16/1971 DOA: 01/09/2024 PCP: Marius Siemens, NP    Chief Complaint  Patient presents with   Headache   Chills    Brief Narrative:  Patient 53 year old gentleman history of hypertension, CHF, diabetes type 2, CKD stage IIIb, anxiety/depression, history of DVT not on anticoagulation recurrent falls presenting with headaches, chills, cough, congestion, nausea, vomiting, diarrhea.  Patient seen in the ED noted to be in acute hypoxic respiratory failure requiring O2, VQ scan done concerning for PE, CT chest done concerning for multifocal pneumonia versus volume overload.  Patient placed on IV heparin and empiric IV antibiotics.  Due to patient's worsening renal function and ongoing volume overload, nephrology consulted.    Assessment & Plan:   Principal Problem:   Pulmonary embolism (HCC) Active Problems:   Acute on chronic diastolic CHF (congestive heart failure) (HCC)   NSTEMI (non-ST elevated myocardial infarction) (HCC)   Acute kidney injury superimposed on CKD (HCC)   Uncontrolled type 2 diabetes mellitus with hyperglycemia, with long-term current use of insulin (HCC)   Deep venous thrombosis (DVT) of left peroneal vein (HCC)   Anemia of chronic disease   Obesity (BMI 30-39.9)   Hypokalemia   Diabetes mellitus (HCC)   Anxiety and depression   Coronary artery disease involving native coronary artery of native heart without angina pectoris   CKD (chronic kidney disease) stage 4, GFR 15-29 ml/min (HCC)   Acute respiratory failure with hypoxia (HCC)  #1 acute hypoxic respiratory failure with hypoxia likely multifactorial secondary to acute PE/multifocal pneumonia versus acute diastolic CHF exacerbation. -Patient noted to present with cough, congestion, shortness of breath, noted to be hypoxic with sats of 86% on room air on presentation.  Patient history of DVT. -VQ scan done positive for large single segmental perfusion  defect within the right upper lobe compatible with PE. -Lower extremity Dopplers done negative for DVT. -CT chest without contrast done nodularity throughout both lungs with a basilar predominant distribution.  Favor multifocal pneumonia.  Smooth septal thickening suspicious for pulmonary edema.  Mild mediastinal adenopathy likely reactive.  Cardiomegaly with coronary artery calcifications and small pericardial effusion.  Dependent skin thickening with area of subcutaneous edema may represent cellulitis.  No obvious drainable collection in the absence of IV contrast. -2D echo with EF of 60 to 65%, and WMA, normal right ventricular systolic function, moderately elevated pulmonary artery systolic pressure.   -Patient with significant lower extremity edema on examination and diffuse crackles noted. -Patient also with hypoalbuminemia. -Urine Legionella antigen negative, urine pneumococcus antigen pending. -Was initially on IV vancomycin and IV Zosyn and transitioned to IV Rocephin and azithromycin due to worsening renal function.  -Discontinued azithromycin. -Was on IV heparin and transition to Eliquis. -Continue Lasix 80 mg IV every 12 hours as recommended by nephrology.   -Strict I's and O's, daily weights.  2.  Right upper lobe PE -Patient with some clinical improvement.   -Hypoxia slowly improving.   -Was on IV heparin and has been transitioned to Eliquis.  -Patient with prior history of DVT, now with acute PE, will likely require lifetime anticoagulation. -Outpatient follow-up with hematology.  3.  Poorly controlled diabetes mellitus type 2 -Hemoglobin A1c 10.8 (09/09/2023). -Repeat hemoglobin A1c 9.3. -Tolerating current diet.  - CBG 87. -Per med rec patient on Lantus 45 units twice daily and NovoLog meal coverage insulin 5 units 3 times daily. -Continue Semglee 20 units twice daily, SSI. -Diabetic coordinator following.  4.  Diarrhea -Likely secondary to viral gastroenteritis. -C.  difficile PCR negative. -GI pathogen panel negative. -Diarrhea improving per RN, significantly decreased stool in the rectal pouch.   -Continue scheduled Imodium for another 24 hours and then change to as needed.   5.  Hypertension -Continue home regimen Norvasc 10 mg daily. -On IV Lasix. -Continue to hold home regimen ACE inhibitor due to worsening renal function.  6.  AKI on CKD stage IIIb/IV -Patient noted to be volume overloaded on examination which is worsened over the past 24 hours.   -Renal function trending up with diuresis.   -Renal ultrasound consistent with medical renal disease, negative for hydronephrosis or stones.  -Urine sodium of 57, urine creatinine of 64.   -Creatinine trending up currently at 4.43 from 4.15 from 2.62 on admission. -Creatinine noted to be 2.28 on 09/17/2023. -Patient volume overloaded on examination and currently on IV Lasix 80 mg every 12 hours. -Urine output of 4.8 L over the past 24 hours. -Patient seen in consultation by nephrology who are following and feel no indication for dialysis at this time and noted that patient has a protein creatinine ratio over 9 g likely chronic in nature. -Status post IV albumin every 6 hours x 1 day.  -Nephrology following and appreciate input and recommendations.  7.  Hypokalemia -Likely secondary to diuresis and GI losses. -Replete.   DVT prophylaxis: Heparin GTT>>>> Eliquis Code Status: Full Family Communication: Update me at bedside. Disposition: Likely home when clinically improved and cleared by nephrology.  Status is: Inpatient The patient will require care spanning > 2 midnights and should be moved to inpatient because: Severity of illness   Consultants:  Nephrology: Dr. Christianne Cowper 01/11/2024  Procedures:  CT chest without contrast 01/09/2024 CT abdomen and pelvis 01/09/2024 Chest x-ray 01/09/2024 VQ scan 01/09/2024 2D echo pending Lower extremity Dopplers 01/09/2024 Renal ultrasound  01/11/2024  Antimicrobials:  Anti-infectives (From admission, onward)    Start     Dose/Rate Route Frequency Ordered Stop   01/11/24 2200  vancomycin (VANCOREADY) IVPB 1750 mg/350 mL  Status:  Discontinued        1,750 mg 175 mL/hr over 120 Minutes Intravenous Every 48 hours 01/09/24 2351 01/11/24 1217   01/11/24 1300  cefTRIAXone (ROCEPHIN) 2 g in sodium chloride 0.9 % 100 mL IVPB        2 g 200 mL/hr over 30 Minutes Intravenous Daily 01/11/24 1217     01/11/24 1300  azithromycin (ZITHROMAX) 500 mg in sodium chloride 0.9 % 250 mL IVPB  Status:  Discontinued        500 mg 250 mL/hr over 60 Minutes Intravenous Daily 01/11/24 1217 01/12/24 1929   01/10/24 0000  piperacillin-tazobactam (ZOSYN) IVPB 3.375 g  Status:  Discontinued        3.375 g 12.5 mL/hr over 240 Minutes Intravenous Every 8 hours 01/09/24 2347 01/11/24 1217   01/10/24 0000  vancomycin (VANCOREADY) IVPB 2000 mg/400 mL        2,000 mg 200 mL/hr over 120 Minutes Intravenous  Once 01/09/24 2348 01/10/24 0239   01/09/24 1200  cefTRIAXone (ROCEPHIN) 1 g in sodium chloride 0.9 % 100 mL IVPB        1 g 200 mL/hr over 30 Minutes Intravenous  Once 01/09/24 1154 01/09/24 1233   01/09/24 1200  azithromycin (ZITHROMAX) 500 mg in sodium chloride 0.9 % 250 mL IVPB        500 mg 250 mL/hr over 60 Minutes Intravenous  Once 01/09/24 1154 01/09/24 1504  Subjective: Laying in bed.  Overall states he is feeling a little bit better.  Some improvement with shortness of breath.  Denies any chest pain.  No abdominal pain.  Still with watery diarrhea.  Rectal pouch noted with no stool in it.  States has good urine output.   Objective: Vitals:   01/12/24 2322 01/13/24 0326 01/13/24 0654 01/13/24 0842  BP: (!) 154/90 (!) 140/87  (!) 147/87  Pulse: 87 89 (!) 101 88  Resp: 20 20 (!) 28 20  Temp: 98.8 F (37.1 C) 98.9 F (37.2 C)  98.2 F (36.8 C)  TempSrc: Oral Oral  Oral  SpO2: 96% 97%    Weight:   112.1 kg   Height:         Intake/Output Summary (Last 24 hours) at 01/13/2024 1238 Last data filed at 01/13/2024 0948 Gross per 24 hour  Intake 1040.17 ml  Output 2851 ml  Net -1810.83 ml   Filed Weights   01/09/24 0519 01/11/24 0316 01/13/24 0654  Weight: 114.8 kg 112.1 kg 112.1 kg    Examination:  General exam: NAD. Respiratory system: Improving bibasilar crackles.  No wheezing.  Fair air movement.   on 2 L nasal cannula Cardiovascular system: Regular rate rhythm no murmurs rubs or gallops.  2-3+ bilateral lower extremity edema.  Gastrointestinal system: Abdomen is soft, nontender, nondistended, positive bowel sounds.  No rebound.  No guarding.  Central nervous system: Alert and oriented. No focal neurological deficits. Extremities: 2-3+ bilateral lower extremity edema.   Skin: No rashes, lesions or ulcers Psychiatry: Judgement and insight appear fair. Mood & affect appropriate.     Data Reviewed: I have personally reviewed following labs and imaging studies  CBC: Recent Labs  Lab 01/09/24 0534 01/10/24 0508 01/11/24 0429 01/12/24 0412 01/13/24 0449  WBC 6.6 6.3 6.8 6.3 5.5  NEUTROABS 4.9  --   --   --   --   HGB 13.9 12.3* 10.0* 12.9* 11.3*  HCT 42.0 37.2* 29.2* 38.6* 32.6*  MCV 89.9 88.2 87.4 86.0 85.3  PLT 246 220 174 192 194    Basic Metabolic Panel: Recent Labs  Lab 01/09/24 0534 01/10/24 0508 01/11/24 0429 01/12/24 0412 01/13/24 0449  NA 134* 134* 134* 135 135  K 4.1 3.5 3.9 3.3* 3.4*  CL 104 104 102 105 103  CO2 19* 19* 17* 19* 20*  GLUCOSE 199* 129* 150* 95 104*  BUN 35* 39* 46* 46* 49*  CREATININE 2.62* 3.03* 3.79* 4.15* 4.43*  CALCIUM 8.2* 7.7* 7.9* 8.5* 8.1*  PHOS  --   --  3.5 4.2 3.9    GFR: Estimated Creatinine Clearance: 24.2 mL/min (A) (by C-G formula based on SCr of 4.43 mg/dL (H)).  Liver Function Tests: Recent Labs  Lab 01/09/24 0534 01/10/24 0508 01/11/24 0429 01/12/24 0412 01/13/24 0449  AST 36 39  --   --   --   ALT 27 28  --   --   --    ALKPHOS 107 92  --   --   --   BILITOT 0.7 0.9  --   --   --   PROT 5.9* 5.4*  --   --   --   ALBUMIN 2.0* 1.7* 2.0* 1.8* 2.2*    CBG: Recent Labs  Lab 01/12/24 0643 01/12/24 1119 01/12/24 1620 01/12/24 2110 01/13/24 0620  GLUCAP 99 135* 119* 153* 87     Recent Results (from the past 240 hours)  Resp panel by RT-PCR (RSV, Flu A&B, Covid)  Anterior Nasal Swab     Status: None   Collection Time: 01/09/24  5:23 AM   Specimen: Anterior Nasal Swab  Result Value Ref Range Status   SARS Coronavirus 2 by RT PCR NEGATIVE NEGATIVE Final   Influenza A by PCR NEGATIVE NEGATIVE Final   Influenza B by PCR NEGATIVE NEGATIVE Final    Comment: (NOTE) The Xpert Xpress SARS-CoV-2/FLU/RSV plus assay is intended as an aid in the diagnosis of influenza from Nasopharyngeal swab specimens and should not be used as a sole basis for treatment. Nasal washings and aspirates are unacceptable for Xpert Xpress SARS-CoV-2/FLU/RSV testing.  Fact Sheet for Patients: BloggerCourse.com  Fact Sheet for Healthcare Providers: SeriousBroker.it  This test is not yet approved or cleared by the [place]  FDA and has been authorized for detection and/or diagnosis of SARS-CoV-2 by FDA under an Emergency Use Authorization (EUA). This EUA will remain in effect (meaning this test can be used) for the duration of the COVID-19 declaration under Section 564(b)(1) of the Act, 21 U.S.C. section 360bbb-3(b)(1), unless the authorization is terminated or revoked.     Resp Syncytial Virus by PCR NEGATIVE NEGATIVE Final    Comment: (NOTE) Fact Sheet for Patients: BloggerCourse.com  Fact Sheet for Healthcare Providers: SeriousBroker.it  This test is not yet approved or cleared by the [place]  FDA and has been authorized for detection and/or diagnosis of SARS-CoV-2 by FDA under an Emergency Use Authorization  (EUA). This EUA will remain in effect (meaning this test can be used) for the duration of the COVID-19 declaration under Section 564(b)(1) of the Act, 21 U.S.C. section 360bbb-3(b)(1), unless the authorization is terminated or revoked.  Performed at [facility], [address] [place], [place] [address]   Culture, blood (routine x 2)     Status: None (Preliminary result)   Collection Time: 01/09/24  8:25 AM   Specimen: BLOOD LEFT FOREARM  Result Value Ref Range Status   Specimen Description BLOOD LEFT FOREARM  Final   Special Requests   Final    BOTTLES DRAWN AEROBIC AND ANAEROBIC Blood Culture results may not be optimal due to an inadequate volume of blood received in culture bottles   Culture   Final    NO GROWTH 4 DAYS Performed at [facility], [address] [place], [place] [address]    Report Status PENDING  Incomplete  Culture, blood (routine x 2)     Status: None (Preliminary result)   Collection Time: 01/09/24  9:43 AM   Specimen: BLOOD LEFT HAND  Result Value Ref Range Status   Specimen Description BLOOD LEFT HAND  Final   Special Requests   Final    BOTTLES DRAWN AEROBIC AND ANAEROBIC Blood Culture results may not be optimal due to an inadequate volume of blood received in culture bottles   Culture   Final    NO GROWTH 4 DAYS Performed at [facility], [address] [place], [place] [address]    Report Status PENDING  Incomplete  Gastrointestinal Panel by PCR , Stool     Status: None   Collection Time: 01/09/24  5:03 PM   Specimen: Stool  Result Value Ref Range Status   Campylobacter species NOT DETECTED NOT DETECTED Final   Plesimonas shigelloides NOT DETECTED NOT DETECTED Final   Salmonella species NOT DETECTED NOT DETECTED Final   Yersinia enterocolitica NOT DETECTED NOT DETECTED Final   Vibrio species NOT DETECTED NOT DETECTED Final   Vibrio cholerae NOT  DETECTED NOT DETECTED Final   Enteroaggregative E coli (EAEC) NOT DETECTED NOT  DETECTED Final   Enteropathogenic E coli (EPEC) NOT DETECTED NOT DETECTED Final   Enterotoxigenic E coli (ETEC) NOT DETECTED NOT DETECTED Final   Shiga like toxin producing E coli (STEC) NOT DETECTED NOT DETECTED Final   Shigella/Enteroinvasive E coli (EIEC) NOT DETECTED NOT DETECTED Final   Cryptosporidium NOT DETECTED NOT DETECTED Final   Cyclospora cayetanensis NOT DETECTED NOT DETECTED Final   Entamoeba histolytica NOT DETECTED NOT DETECTED Final   Giardia lamblia NOT DETECTED NOT DETECTED Final   Adenovirus F40/41 NOT DETECTED NOT DETECTED Final   Astrovirus NOT DETECTED NOT DETECTED Final   Norovirus GI/GII NOT DETECTED NOT DETECTED Final   Rotavirus A NOT DETECTED NOT DETECTED Final   Sapovirus (I, II, IV, and V) NOT DETECTED NOT DETECTED Final    Comment: Performed at [facility], [address] [place], [place] [address]  C Difficile Quick Screen w PCR reflex     Status: None   Collection Time: 01/09/24  5:03 PM   Specimen: Stool  Result Value Ref Range Status   C Diff antigen NEGATIVE NEGATIVE Final   C Diff toxin NEGATIVE NEGATIVE Final   C Diff interpretation No C. difficile detected.  Final    Comment: Performed at [facility], [address], [place], [place] [address]  MRSA Next Gen by PCR, Nasal     Status: None   Collection Time: 01/11/24  8:47 AM   Specimen: Nasal Mucosa; Nasal Swab  Result Value Ref Range Status   MRSA by PCR Next Gen NOT DETECTED NOT DETECTED Final    Comment: (NOTE) The GeneXpert MRSA Assay (FDA approved for NASAL specimens only), is one component of a comprehensive MRSA colonization surveillance program. It is not intended to diagnose MRSA infection nor to guide or monitor treatment for MRSA infections. Test performance is not FDA approved in patients less than 27 years old. Performed at [facility], [address] [place], [place] [address]          Radiology Studies: No results  found.       Scheduled Meds:  amLODipine  10 mg Oral Daily   apixaban  10 mg Oral BID   Followed by   Cecily Cohen ON 01/19/2024] apixaban  5 mg Oral BID   fluticasone  2 spray Each Nare Daily   furosemide  80 mg Intravenous Q12H   guaiFENesin  1,200 mg Oral BID   insulin aspart  0-9 Units Subcutaneous TID WC   insulin glargine-yfgn  20 Units Subcutaneous BID   loperamide  2 mg Oral Once   loperamide  4 mg Oral TID   loratadine  10 mg Oral Daily   pantoprazole  40 mg Oral BID   sodium bicarbonate  650 mg Oral BID   sodium chloride flush  3 mL Intravenous Q12H   sucralfate  1 g Oral TID WC & HS   [START ON 01/14/2024] thiamine  100 mg Oral Daily   Continuous Infusions:  cefTRIAXone (ROCEPHIN)  IV 2 g (01/13/24 1133)     LOS: 3 days    Time spent: 40 mins    Hilda Lovings, MD [facility]   To contact the attending provider between 7A-7P or the covering provider during after hours 7P-7A, please log into the web site [email or web address] and access using universal [place] password for that web site. If you do not have the password, please  call the hospital operator.  01/13/2024, 12:38 PM

## 2024-01-14 DIAGNOSIS — D638 Anemia in other chronic diseases classified elsewhere: Secondary | ICD-10-CM | POA: Diagnosis not present

## 2024-01-14 DIAGNOSIS — I5033 Acute on chronic diastolic (congestive) heart failure: Secondary | ICD-10-CM | POA: Diagnosis not present

## 2024-01-14 DIAGNOSIS — I2699 Other pulmonary embolism without acute cor pulmonale: Secondary | ICD-10-CM | POA: Diagnosis not present

## 2024-01-14 DIAGNOSIS — J9601 Acute respiratory failure with hypoxia: Secondary | ICD-10-CM | POA: Diagnosis not present

## 2024-01-14 LAB — GLUCOSE, CAPILLARY
Glucose-Capillary: 125 mg/dL — ABNORMAL HIGH (ref 70–99)
Glucose-Capillary: 169 mg/dL — ABNORMAL HIGH (ref 70–99)
Glucose-Capillary: 242 mg/dL — ABNORMAL HIGH (ref 70–99)
Glucose-Capillary: 303 mg/dL — ABNORMAL HIGH (ref 70–99)

## 2024-01-14 LAB — CBC
HCT: 33.5 % — ABNORMAL LOW (ref 39.0–52.0)
Hemoglobin: 11.2 g/dL — ABNORMAL LOW (ref 13.0–17.0)
MCH: 28.9 pg (ref 26.0–34.0)
MCHC: 33.4 g/dL (ref 30.0–36.0)
MCV: 86.6 fL (ref 80.0–100.0)
Platelets: 217 10*3/uL (ref 150–400)
RBC: 3.87 MIL/uL — ABNORMAL LOW (ref 4.22–5.81)
RDW: 12.6 % (ref 11.5–15.5)
WBC: 4.6 10*3/uL (ref 4.0–10.5)
nRBC: 0 % (ref 0.0–0.2)

## 2024-01-14 LAB — RENAL FUNCTION PANEL
Albumin: 2.1 g/dL — ABNORMAL LOW (ref 3.5–5.0)
Anion gap: 13 (ref 5–15)
BUN: 56 mg/dL — ABNORMAL HIGH (ref 6–20)
CO2: 19 mmol/L — ABNORMAL LOW (ref 22–32)
Calcium: 8.1 mg/dL — ABNORMAL LOW (ref 8.9–10.3)
Chloride: 104 mmol/L (ref 98–111)
Creatinine, Ser: 4.04 mg/dL — ABNORMAL HIGH (ref 0.61–1.24)
GFR, Estimated: 17 mL/min — ABNORMAL LOW (ref >60)
Glucose, Bld: 216 mg/dL — ABNORMAL HIGH (ref 70–99)
Phosphorus: 4 mg/dL (ref 2.5–4.6)
Potassium: 3.5 mmol/L (ref 3.5–5.1)
Sodium: 136 mmol/L (ref 135–145)

## 2024-01-14 LAB — CULTURE, BLOOD (ROUTINE X 2)
Culture: NO GROWTH
Culture: NO GROWTH

## 2024-01-14 MED ORDER — FUROSEMIDE 10 MG/ML IJ SOLN
120.0000 mg | Freq: Two times a day (BID) | INTRAVENOUS | Status: DC
  Administered 2024-01-14 – 2024-01-19 (×11): 120 mg via INTRAVENOUS
  Filled 2024-01-14: qty 2
  Filled 2024-01-14: qty 10
  Filled 2024-01-14 (×2): qty 2
  Filled 2024-01-14: qty 120
  Filled 2024-01-14: qty 12
  Filled 2024-01-14: qty 10
  Filled 2024-01-14: qty 12
  Filled 2024-01-14: qty 2
  Filled 2024-01-14: qty 10
  Filled 2024-01-14: qty 2
  Filled 2024-01-14: qty 10
  Filled 2024-01-14: qty 2

## 2024-01-14 MED ORDER — POTASSIUM CHLORIDE CRYS ER 20 MEQ PO TBCR
40.0000 meq | EXTENDED_RELEASE_TABLET | Freq: Once | ORAL | Status: AC
  Administered 2024-01-14: 40 meq via ORAL
  Filled 2024-01-14: qty 2

## 2024-01-14 MED ORDER — LOPERAMIDE HCL 2 MG PO CAPS: 2.0000 mg | ORAL_CAPSULE | ORAL | Status: DC | PRN

## 2024-01-14 NOTE — Progress Notes (Signed)
 Subjective:    UOP of 3200 last 24 hours on lasix- weight down total of 10 pounds- he says he can feel it a little- BP normal to up -  BUN up but crt down today -    Objective Vital signs in last 24 hours: Vitals:   01/13/24 2335 01/14/24 0311 01/14/24 0500 01/14/24 0816  BP: (!) 147/95 (!) 145/79  132/73  Pulse: 92 82  82  Resp: 15 20  16   Temp: 97.8 F (36.6 C) 98.5 F (36.9 C)  98.3 F (36.8 C)  TempSrc: Oral Oral  Oral  SpO2: 99% 96%  100%  Weight:   110.4 kg   Height:       Weight change: -1.7 kg  Intake/Output Summary (Last 24 hours) at 01/14/2024 1101 Last data filed at 01/14/2024 0514 Gross per 24 hour  Intake 480 ml  Output 2550 ml  Net -2070 ml    Assessment/ Plan: Pt is a 53 y.o. yo male with CKD who was admitted on 01/09/2024 with PE-   complication of A on CRF  Assessment/Plan: 1. Renal-  baseline CKD-  crt in the 2's with longstanding proteinuria-  likely from poorly controlled DM-  AKi in the setting of PE-  possibly some relative hypotension initially.  Is nonoliguric -  crt trending down today-  supportive care-  no indications for dialysis at present.   Prt/crt ratio over 9 grams likely chronic-  kidney sizes 11 cm with inc echogenicity-  no hydro.  Happy to see that crt trended down.  He will need nephrology follow up at discharge 2. HTN/volume-  volume overloaded and third spacing due to hypoalbuminemia-  on iv lasix having good response-  will inc dose today slightly as we have a ways to go 3. Anemia-  not an issue at present  4. PE-  on anticoagulation  5. Hypoxia-  due to PE-  also PNA on abx-  CHF on lasix 6. Metabolic acidosis-  on sodium bicarb  7, hypokalemia-  on repletion     Cecille Aver    Labs: Basic Metabolic Panel: Recent Labs  Lab 01/12/24 0412 01/13/24 0449 01/14/24 0354  NA 135 135 136  K 3.3* 3.4* 3.5  CL 105 103 104  CO2 19* 20* 19*  GLUCOSE 95 104* 216*  BUN 46* 49* 56*  CREATININE 4.15* 4.43* 4.04*  CALCIUM 8.5*  8.1* 8.1*  PHOS 4.2 3.9 4.0   Liver Function Tests: Recent Labs  Lab 01/09/24 0534 01/10/24 0508 01/11/24 0429 01/12/24 0412 01/13/24 0449 01/14/24 0354  AST 36 39  --   --   --   --   ALT 27 28  --   --   --   --   ALKPHOS 107 92  --   --   --   --   BILITOT 0.7 0.9  --   --   --   --   PROT 5.9* 5.4*  --   --   --   --   ALBUMIN 2.0* 1.7*   < > 1.8* 2.2* 2.1*   < > = values in this interval not displayed.   Recent Labs  Lab 01/09/24 0534  LIPASE 33   No results for input(s): "AMMONIA" in the last 168 hours. CBC: Recent Labs  Lab 01/09/24 0534 01/10/24 0508 01/11/24 0429 01/12/24 0412 01/13/24 0449 01/14/24 0354  WBC 6.6 6.3 6.8 6.3 5.5 4.6  NEUTROABS 4.9  --   --   --   --   --  HGB 13.9 12.3* 10.0* 12.9* 11.3* 11.2*  HCT 42.0 37.2* 29.2* 38.6* 32.6* 33.5*  MCV 89.9 88.2 87.4 86.0 85.3 86.6  PLT 246 220 174 192 194 217   Cardiac Enzymes: No results for input(s): "CKTOTAL", "CKMB", "CKMBINDEX", "TROPONINI" in the last 168 hours. CBG: Recent Labs  Lab 01/13/24 0620 01/13/24 1253 01/13/24 1631 01/13/24 2045 01/14/24 0555  GLUCAP 87 127* 147* 210* 125*    Iron Studies: No results for input(s): "IRON", "TIBC", "TRANSFERRIN", "FERRITIN" in the last 72 hours. Studies/Results: No results found.  Medications: Infusions:  cefTRIAXone (ROCEPHIN)  IV 2 g (01/14/24 0838)    Scheduled Medications:  amLODipine  10 mg Oral Daily   apixaban  10 mg Oral BID   Followed by   Cecily Cohen ON 01/19/2024] apixaban  5 mg Oral BID   fluticasone  2 spray Each Nare Daily   furosemide  80 mg Intravenous Q12H   guaiFENesin  1,200 mg Oral BID   insulin aspart  0-9 Units Subcutaneous TID WC   insulin glargine-yfgn  20 Units Subcutaneous BID   loperamide  2 mg Oral Once   loratadine  10 mg Oral Daily   pantoprazole  40 mg Oral BID   potassium chloride  40 mEq Oral Once   sodium bicarbonate  650 mg Oral BID   sodium chloride flush  3 mL Intravenous Q12H   sucralfate  1 g  Oral TID WC & HS   thiamine  100 mg Oral Daily    have reviewed scheduled and prn medications.  Physical Exam: General: NAD at present-  O2 sat better  Heart: slightly tachy Lungs: poor effort -  crackles R>L Abdomen: soft,  non tender Extremities: pitting edema throughout     01/14/2024,11:01 AM  LOS: 4 days

## 2024-01-14 NOTE — Progress Notes (Signed)
 PROGRESS NOTE    Leroy Simmons  UUV:[id number] DOB: 27-Oct-1970 DOA: 01/09/2024 PCP: Marius Siemens, NP    Chief Complaint  Patient presents with   Headache   Chills    Brief Narrative:  Patient 53 year old gentleman history of hypertension, CHF, diabetes type 2, CKD stage IIIb, anxiety/depression, history of DVT not on anticoagulation recurrent falls presenting with headaches, chills, cough, congestion, nausea, vomiting, diarrhea.  Patient seen in the ED noted to be in acute hypoxic respiratory failure requiring O2, VQ scan done concerning for PE, CT chest done concerning for multifocal pneumonia versus volume overload.  Patient placed on IV heparin and empiric IV antibiotics.  Due to patient's worsening renal function and ongoing volume overload, nephrology consulted.    Assessment & Plan:   Principal Problem:   Pulmonary embolism (HCC) Active Problems:   Acute on chronic diastolic CHF (congestive heart failure) (HCC)   NSTEMI (non-ST elevated myocardial infarction) (HCC)   Acute kidney injury superimposed on CKD (HCC)   Uncontrolled type 2 diabetes mellitus with hyperglycemia, with long-term current use of insulin (HCC)   Deep venous thrombosis (DVT) of left peroneal vein (HCC)   Anemia of chronic disease   Obesity (BMI 30-39.9)   Hypokalemia   Diabetes mellitus (HCC)   Anxiety and depression   Coronary artery disease involving native coronary artery of native heart without angina pectoris   CKD (chronic kidney disease) stage 4, GFR 15-29 ml/min (HCC)   Acute respiratory failure with hypoxia (HCC)  #1 acute hypoxic respiratory failure with hypoxia likely multifactorial secondary to acute PE/multifocal pneumonia versus acute diastolic CHF exacerbation. -Patient noted to present with cough, congestion, shortness of breath, noted to be hypoxic with sats of 86% on room air on presentation.  Patient history of DVT. -VQ scan done positive for large single segmental perfusion  defect within the right upper lobe compatible with PE. -Lower extremity Dopplers done negative for DVT. -CT chest without contrast done nodularity throughout both lungs with a basilar predominant distribution.  Favor multifocal pneumonia.  Smooth septal thickening suspicious for pulmonary edema.  Mild mediastinal adenopathy likely reactive.  Cardiomegaly with coronary artery calcifications and small pericardial effusion.  Dependent skin thickening with area of subcutaneous edema may represent cellulitis.  No obvious drainable collection in the absence of IV contrast. -2D echo with EF of 60 to 65%, and WMA, normal right ventricular systolic function, moderately elevated pulmonary artery systolic pressure.   -Patient with significant lower extremity edema on examination and diffuse crackles noted. -Patient also with hypoalbuminemia. -Urine Legionella antigen negative, urine pneumococcus antigen pending. -Was initially on IV vancomycin and IV Zosyn and transitioned to IV Rocephin and azithromycin due to worsening renal function.  -Discontinued azithromycin. -Was on IV heparin and transitioned to Eliquis. -IV Lasix dose increased to 120 mg every 12 hours as recommended per nephrology.  -Strict I's and O's, daily weights.  2.  Right upper lobe PE -Patient with some clinical improvement.   -Hypoxia slowly improving.   -Was on IV heparin and has been transitioned to Eliquis.  -Patient with prior history of DVT, now with acute PE, will likely require lifetime anticoagulation. -Outpatient follow-up with hematology.  3.  Poorly controlled diabetes mellitus type 2 -Hemoglobin A1c 10.8 (09/09/2023). -Repeat hemoglobin A1c 9.3. -Tolerating current diet.  - CBG 125. -Per med rec patient on Lantus 45 units twice daily and NovoLog meal coverage insulin 5 units 3 times daily. -Continue Semglee 20 units twice daily, SSI. -Diabetic coordinator following.  4.  Diarrhea -Likely secondary to viral  gastroenteritis. -C. difficile PCR negative. -GI pathogen panel negative. -Diarrhea improving per RN, significantly decreased stool in the rectal pouch.   -Discontinue scheduled Imodium and changed to as needed Imodium.   5.  Hypertension -Continue home regimen Norvasc 10 mg daily. -On IV Lasix. -Continue to hold home regimen ACE inhibitor due to worsening renal function.  6.  AKI on CKD stage IIIb/IV -Patient noted to be volume overloaded on examination which is worsened over the past 24 hours.   -Renal function trending up with diuresis.   -Renal ultrasound consistent with medical renal disease, negative for hydronephrosis or stones.  -Urine sodium of 57, urine creatinine of 64.   -Creatinine was trending up and starting to trend back down with creatinine currently at 4.04 from 4.43 from 4.15 from 2.62 on admission. -Creatinine noted to be 2.28 on 09/17/2023. -Patient volume overloaded on examination and was on IV Lasix 80 mg every 12 hours. -Urine output of 3.2 L over the past 24 hours. --Lasix dose increased to 120 mg IV every 12 hours. -Patient seen in consultation by nephrology who are following and feel no indication for dialysis at this time and noted that patient has a protein creatinine ratio over 9 g likely chronic in nature. -Status post IV albumin every 6 hours x 1 day.  -Nephrology following and appreciate input and recommendations.  7.  Hypokalemia -Likely secondary to diuresis and GI losses. -Repleted.   DVT prophylaxis: Heparin GTT>>>> Eliquis Code Status: Full Family Communication: No family at bedside. Disposition: Likely home when clinically improved and cleared by nephrology.  Status is: Inpatient The patient will require care spanning > 2 midnights and should be moved to inpatient because: Severity of illness   Consultants:  Nephrology: Dr. Christianne Cowper 01/11/2024  Procedures:  CT chest without contrast 01/09/2024 CT abdomen and pelvis 01/09/2024 Chest x-ray  01/09/2024 VQ scan 01/09/2024 2D echo pending Lower extremity Dopplers 01/09/2024 Renal ultrasound 01/11/2024  Antimicrobials:  Anti-infectives (From admission, onward)    Start     Dose/Rate Route Frequency Ordered Stop   01/11/24 2200  vancomycin (VANCOREADY) IVPB 1750 mg/350 mL  Status:  Discontinued        1,750 mg 175 mL/hr over 120 Minutes Intravenous Every 48 hours 01/09/24 2351 01/11/24 1217   01/11/24 1300  cefTRIAXone (ROCEPHIN) 2 g in sodium chloride 0.9 % 100 mL IVPB        2 g 200 mL/hr over 30 Minutes Intravenous Daily 01/11/24 1217     01/11/24 1300  azithromycin (ZITHROMAX) 500 mg in sodium chloride 0.9 % 250 mL IVPB  Status:  Discontinued        500 mg 250 mL/hr over 60 Minutes Intravenous Daily 01/11/24 1217 01/12/24 1929   01/10/24 0000  piperacillin-tazobactam (ZOSYN) IVPB 3.375 g  Status:  Discontinued        3.375 g 12.5 mL/hr over 240 Minutes Intravenous Every 8 hours 01/09/24 2347 01/11/24 1217   01/10/24 0000  vancomycin (VANCOREADY) IVPB 2000 mg/400 mL        2,000 mg 200 mL/hr over 120 Minutes Intravenous  Once 01/09/24 2348 01/10/24 0239   01/09/24 1200  cefTRIAXone (ROCEPHIN) 1 g in sodium chloride 0.9 % 100 mL IVPB        1 g 200 mL/hr over 30 Minutes Intravenous  Once 01/09/24 1154 01/09/24 1233   01/09/24 1200  azithromycin (ZITHROMAX) 500 mg in sodium chloride 0.9 % 250 mL IVPB  500 mg 250 mL/hr over 60 Minutes Intravenous  Once 01/09/24 1154 01/09/24 1504         Subjective: Patient in bed.  Denies any chest pain.  Feels shortness of breath is improving.  States diarrhea is improving.  Good urine output.    Objective: Vitals:   01/14/24 0311 01/14/24 0500 01/14/24 0816 01/14/24 1157  BP: (!) 145/79  132/73 (!) 165/98  Pulse: 82  82 86  Resp: 20  16 14   Temp: 98.5 F (36.9 C)  98.3 F (36.8 C) 98.1 F (36.7 C)  TempSrc: Oral  Oral Oral  SpO2: 96%  100% 95%  Weight:  110.4 kg    Height:        Intake/Output Summary (Last 24 hours)  at 01/14/2024 1226 Last data filed at 01/14/2024 0514 Gross per 24 hour  Intake 480 ml  Output 2550 ml  Net -2070 ml   Filed Weights   01/11/24 0316 01/13/24 0654 01/14/24 0500  Weight: 112.1 kg 112.1 kg 110.4 kg    Examination:  General exam: NAD Respiratory system: Bibasilar crackles.  No wheezing, no rhonchi.  Fair air movement.  Speaking in full sentences.  Cardiovascular system: RRR no murmurs rubs or gallops.  2-3+ bilateral lower extremity edema.   Gastrointestinal system: Abdomen is soft, nontender, nondistended, positive bowel sounds.  No rebound.  No guarding.  Central nervous system: Alert and oriented. No focal neurological deficits. Extremities: 2-3+ bilateral lower extremity edema.   Skin: No rashes, lesions or ulcers Psychiatry: Judgement and insight appear fair. Mood & affect appropriate.     Data Reviewed: I have personally reviewed following labs and imaging studies  CBC: Recent Labs  Lab 01/09/24 0534 01/10/24 0508 01/11/24 0429 01/12/24 0412 01/13/24 0449 01/14/24 0354  WBC 6.6 6.3 6.8 6.3 5.5 4.6  NEUTROABS 4.9  --   --   --   --   --   HGB 13.9 12.3* 10.0* 12.9* 11.3* 11.2*  HCT 42.0 37.2* 29.2* 38.6* 32.6* 33.5*  MCV 89.9 88.2 87.4 86.0 85.3 86.6  PLT 246 220 174 192 194 217    Basic Metabolic Panel: Recent Labs  Lab 01/10/24 0508 01/11/24 0429 01/12/24 0412 01/13/24 0449 01/14/24 0354  NA 134* 134* 135 135 136  K 3.5 3.9 3.3* 3.4* 3.5  CL 104 102 105 103 104  CO2 19* 17* 19* 20* 19*  GLUCOSE 129* 150* 95 104* 216*  BUN 39* 46* 46* 49* 56*  CREATININE 3.03* 3.79* 4.15* 4.43* 4.04*  CALCIUM 7.7* 7.9* 8.5* 8.1* 8.1*  PHOS  --  3.5 4.2 3.9 4.0    GFR: Estimated Creatinine Clearance: 26.4 mL/min (A) (by C-G formula based on SCr of 4.04 mg/dL (H)).  Liver Function Tests: Recent Labs  Lab 01/09/24 0534 01/10/24 0508 01/11/24 0429 01/12/24 0412 01/13/24 0449 01/14/24 0354  AST 36 39  --   --   --   --   ALT 27 28  --   --   --    --   ALKPHOS 107 92  --   --   --   --   BILITOT 0.7 0.9  --   --   --   --   PROT 5.9* 5.4*  --   --   --   --   ALBUMIN 2.0* 1.7* 2.0* 1.8* 2.2* 2.1*    CBG: Recent Labs  Lab 01/13/24 1253 01/13/24 1631 01/13/24 2045 01/14/24 0555 01/14/24 1155  GLUCAP 127* 147* 210* 125* 169*  Recent Results (from the past 240 hours)  Resp panel by RT-PCR (RSV, Flu A&B, Covid) Anterior Nasal Swab     Status: None   Collection Time: 01/09/24  5:23 AM   Specimen: Anterior Nasal Swab  Result Value Ref Range Status   SARS Coronavirus 2 by RT PCR NEGATIVE NEGATIVE Final   Influenza A by PCR NEGATIVE NEGATIVE Final   Influenza B by PCR NEGATIVE NEGATIVE Final    Comment: (NOTE) The Xpert Xpress SARS-CoV-2/FLU/RSV plus assay is intended as an aid in the diagnosis of influenza from Nasopharyngeal swab specimens and should not be used as a sole basis for treatment. Nasal washings and aspirates are unacceptable for Xpert Xpress SARS-CoV-2/FLU/RSV testing.  Fact Sheet for Patients: BloggerCourse.com  Fact Sheet for Healthcare Providers: SeriousBroker.it  This test is not yet approved or cleared by the [place]  FDA and has been authorized for detection and/or diagnosis of SARS-CoV-2 by FDA under an Emergency Use Authorization (EUA). This EUA will remain in effect (meaning this test can be used) for the duration of the COVID-19 declaration under Section 564(b)(1) of the Act, 21 U.S.C. section 360bbb-3(b)(1), unless the authorization is terminated or revoked.     Resp Syncytial Virus by PCR NEGATIVE NEGATIVE Final    Comment: (NOTE) Fact Sheet for Patients: BloggerCourse.com  Fact Sheet for Healthcare Providers: SeriousBroker.it  This test is not yet approved or cleared by the [place]  FDA and has been authorized for detection and/or diagnosis of SARS-CoV-2 by FDA under an  Emergency Use Authorization (EUA). This EUA will remain in effect (meaning this test can be used) for the duration of the COVID-19 declaration under Section 564(b)(1) of the Act, 21 U.S.C. section 360bbb-3(b)(1), unless the authorization is terminated or revoked.  Performed at [facility], [address] [place], [place] [address]   Culture, blood (routine x 2)     Status: None   Collection Time: 01/09/24  8:25 AM   Specimen: BLOOD LEFT FOREARM  Result Value Ref Range Status   Specimen Description BLOOD LEFT FOREARM  Final   Special Requests   Final    BOTTLES DRAWN AEROBIC AND ANAEROBIC Blood Culture results may not be optimal due to an inadequate volume of blood received in culture bottles   Culture   Final    NO GROWTH 5 DAYS Performed at [facility], [address] [place], [place] [address]    Report Status 01/14/2024 FINAL  Final  Culture, blood (routine x 2)     Status: None   Collection Time: 01/09/24  9:43 AM   Specimen: BLOOD LEFT HAND  Result Value Ref Range Status   Specimen Description BLOOD LEFT HAND  Final   Special Requests   Final    BOTTLES DRAWN AEROBIC AND ANAEROBIC Blood Culture results may not be optimal due to an inadequate volume of blood received in culture bottles   Culture   Final    NO GROWTH 5 DAYS Performed at [facility], [address], [place], [place] [address]    Report Status 01/14/2024 FINAL  Final  Gastrointestinal Panel by PCR , Stool     Status: None   Collection Time: 01/09/24  5:03 PM   Specimen: Stool  Result Value Ref Range Status   Campylobacter species NOT DETECTED NOT DETECTED Final   Plesimonas shigelloides NOT DETECTED NOT DETECTED Final   Salmonella species NOT DETECTED NOT DETECTED Final   Yersinia enterocolitica NOT DETECTED NOT DETECTED Final  Vibrio species NOT DETECTED NOT DETECTED Final   Vibrio cholerae NOT DETECTED NOT DETECTED Final   Enteroaggregative E coli (EAEC) NOT DETECTED NOT DETECTED  Final   Enteropathogenic E coli (EPEC) NOT DETECTED NOT DETECTED Final   Enterotoxigenic E coli (ETEC) NOT DETECTED NOT DETECTED Final   Shiga like toxin producing E coli (STEC) NOT DETECTED NOT DETECTED Final   Shigella/Enteroinvasive E coli (EIEC) NOT DETECTED NOT DETECTED Final   Cryptosporidium NOT DETECTED NOT DETECTED Final   Cyclospora cayetanensis NOT DETECTED NOT DETECTED Final   Entamoeba histolytica NOT DETECTED NOT DETECTED Final   Giardia lamblia NOT DETECTED NOT DETECTED Final   Adenovirus F40/41 NOT DETECTED NOT DETECTED Final   Astrovirus NOT DETECTED NOT DETECTED Final   Norovirus GI/GII NOT DETECTED NOT DETECTED Final   Rotavirus A NOT DETECTED NOT DETECTED Final   Sapovirus (I, II, IV, and V) NOT DETECTED NOT DETECTED Final    Comment: Performed at [facility], [address] [place], [place] [address]  C Difficile Quick Screen w PCR reflex     Status: None   Collection Time: 01/09/24  5:03 PM   Specimen: Stool  Result Value Ref Range Status   C Diff antigen NEGATIVE NEGATIVE Final   C Diff toxin NEGATIVE NEGATIVE Final   C Diff interpretation No C. difficile detected.  Final    Comment: Performed at [facility], [address], [place], [place] [address]  MRSA Next Gen by PCR, Nasal     Status: None   Collection Time: 01/11/24  8:47 AM   Specimen: Nasal Mucosa; Nasal Swab  Result Value Ref Range Status   MRSA by PCR Next Gen NOT DETECTED NOT DETECTED Final    Comment: (NOTE) The GeneXpert MRSA Assay (FDA approved for NASAL specimens only), is one component of a comprehensive MRSA colonization surveillance program. It is not intended to diagnose MRSA infection nor to guide or monitor treatment for MRSA infections. Test performance is not FDA approved in patients less than 36 years old. Performed at [facility], [address], [place], [place] [address]          Radiology Studies: No results found.       Scheduled Meds:   amLODipine  10 mg Oral Daily   apixaban  10 mg Oral BID   Followed by   Cecily Cohen ON 01/19/2024] apixaban  5 mg Oral BID   fluticasone  2 spray Each Nare Daily   guaiFENesin  1,200 mg Oral BID   insulin aspart  0-9 Units Subcutaneous TID WC   insulin glargine-yfgn  20 Units Subcutaneous BID   loperamide  2 mg Oral Once   loratadine  10 mg Oral Daily   pantoprazole  40 mg Oral BID   sodium bicarbonate  650 mg Oral BID   sodium chloride flush  3 mL Intravenous Q12H   sucralfate  1 g Oral TID WC & HS   thiamine  100 mg Oral Daily   Continuous Infusions:  cefTRIAXone (ROCEPHIN)  IV 2 g (01/14/24 0838)   furosemide       LOS: 4 days    Time spent: 40 mins    Hilda Lovings, MD [facility]   To contact the attending provider between 7A-7P or the covering provider during after hours 7P-7A, please log into the web site [email or web address] and access using universal [place] password for that web site. If you do not have the password, please call the  hospital operator.  01/14/2024, 12:26 PM

## 2024-01-14 NOTE — Progress Notes (Addendum)
 Mobility Specialist Progress Note:    01/14/24 1223  Mobility  Activity Stood at bedside (Marched in place)  Level of Assistance Standby assist, set-up cues, supervision of patient - no hands on  Assistive Device None  Activity Response Tolerated well  Mobility Referral Yes  Mobility visit 1 Mobility  Mobility Specialist Start Time (ACUTE ONLY) 1223  Mobility Specialist Stop Time (ACUTE ONLY) 1232  Mobility Specialist Time Calculation (min) (ACUTE ONLY) 9 min   Pt received in bed, agreeable to mobility session. Limited activity d/t dizziness. Performed orthostatics. Pt able to march in place at bedside. Returned pt to bed, all needs met, call bell in reach. See BP below.   Supine: 118/76 (90) EOB: 144/96 (110) Standing 1 min: 147/84 (103) Post Mobility: 141/83 (100)   Kary Pages Mobility Specialist Please contact via Special educational needs teacher or  Rehab office at [phone]

## 2024-01-15 DIAGNOSIS — I5033 Acute on chronic diastolic (congestive) heart failure: Secondary | ICD-10-CM | POA: Diagnosis not present

## 2024-01-15 DIAGNOSIS — D638 Anemia in other chronic diseases classified elsewhere: Secondary | ICD-10-CM | POA: Diagnosis not present

## 2024-01-15 DIAGNOSIS — J9601 Acute respiratory failure with hypoxia: Secondary | ICD-10-CM | POA: Diagnosis not present

## 2024-01-15 DIAGNOSIS — I2699 Other pulmonary embolism without acute cor pulmonale: Secondary | ICD-10-CM | POA: Diagnosis not present

## 2024-01-15 LAB — RENAL FUNCTION PANEL
Albumin: 2.5 g/dL — ABNORMAL LOW (ref 3.5–5.0)
Anion gap: 12 (ref 5–15)
BUN: 55 mg/dL — ABNORMAL HIGH (ref 6–20)
CO2: 22 mmol/L (ref 22–32)
Calcium: 8.6 mg/dL — ABNORMAL LOW (ref 8.9–10.3)
Chloride: 104 mmol/L (ref 98–111)
Creatinine, Ser: 3.45 mg/dL — ABNORMAL HIGH (ref 0.61–1.24)
GFR, Estimated: 21 mL/min — ABNORMAL LOW (ref >60)
Glucose, Bld: 187 mg/dL — ABNORMAL HIGH (ref 70–99)
Phosphorus: 2.9 mg/dL (ref 2.5–4.6)
Potassium: 3.6 mmol/L (ref 3.5–5.1)
Sodium: 138 mmol/L (ref 135–145)

## 2024-01-15 LAB — GLUCOSE, CAPILLARY
Glucose-Capillary: 168 mg/dL — ABNORMAL HIGH (ref 70–99)
Glucose-Capillary: 145 mg/dL — ABNORMAL HIGH (ref 70–99)
Glucose-Capillary: 231 mg/dL — ABNORMAL HIGH (ref 70–99)
Glucose-Capillary: 198 mg/dL — ABNORMAL HIGH (ref 70–99)

## 2024-01-15 LAB — CBC
HCT: 40.1 % (ref 39.0–52.0)
Hemoglobin: 13.6 g/dL (ref 13.0–17.0)
MCH: 29.1 pg (ref 26.0–34.0)
MCHC: 33.9 g/dL (ref 30.0–36.0)
MCV: 85.7 fL (ref 80.0–100.0)
Platelets: 331 10*3/uL (ref 150–400)
RBC: 4.68 MIL/uL (ref 4.22–5.81)
RDW: 12.6 % (ref 11.5–15.5)
WBC: 6.9 10*3/uL (ref 4.0–10.5)
nRBC: 0 % (ref 0.0–0.2)

## 2024-01-15 MED ORDER — INSULIN ASPART 100 UNIT/ML IJ SOLN
4.0000 [IU] | Freq: Three times a day (TID) | INTRAMUSCULAR | Status: DC
  Administered 2024-01-15 – 2024-01-16 (×5): 4 [IU] via SUBCUTANEOUS

## 2024-01-15 MED ORDER — AMOXICILLIN-POT CLAVULANATE 875-125 MG PO TABS
1.0000 | ORAL_TABLET | Freq: Two times a day (BID) | ORAL | Status: AC
  Administered 2024-01-16 – 2024-01-17 (×4): 1 via ORAL
  Filled 2024-01-15 (×4): qty 1

## 2024-01-15 NOTE — Inpatient Diabetes Management (Signed)
 Inpatient Diabetes Program Recommendations  AACE/ADA: New Consensus Statement on Inpatient Glycemic Control (2015)  Target Ranges:  Prepandial:   less than 140 mg/dL      Peak postprandial:   less than 180 mg/dL (1-2 hours)      Critically ill patients:  140 - 180 mg/dL   Lab Results  Component Value Date   GLUCAP 168 (H) 01/15/2024   HGBA1C 9.3 (H) 01/11/2024    Review of Glycemic Control  Latest Reference Range & Units 01/14/24 05:55 01/14/24 11:55 01/14/24 16:13 01/14/24 21:48 01/15/24 06:37  Glucose-Capillary 70 - 99 mg/dL 962 (H) 952 (H) 841 (H) 303 (H) 168 (H)    Diabetes history: DM2 Outpatient Diabetes medications: Lantus 22 units BID, Novolog 5 units TID Current orders for Inpatient glycemic control:  Semglee 20 units BID Novolog 0-9 units TID  Note: post prandial glucose trends increase. Pt take meal coverage insulin at home.  -   Start Novolog 4 units tid meal coverage if eating >50% of meals  Will continue to follow while inpatient.  Thanks, Eloise Hake RN, MSN, BC-ADM Inpatient Diabetes Coordinator Team Pager [phone] (8a-5p)

## 2024-01-15 NOTE — Plan of Care (Signed)
  Problem: Education: Goal: Ability to describe self-care measures that may prevent or decrease complications (Diabetes Survival Skills Education) will improve Outcome: Progressing   Problem: Education: Goal: Individualized Educational Video(s) Outcome: Progressing   Problem: Coping: Goal: Ability to adjust to condition or change in health will improve Outcome: Progressing   Problem: Fluid Volume: Goal: Ability to maintain a balanced intake and output will improve Outcome: Progressing   Problem: Health Behavior/Discharge Planning: Goal: Ability to identify and utilize available resources and services will improve Outcome: Progressing Goal: Ability to manage health-related needs will improve Outcome: Progressing   Problem: Metabolic: Goal: Ability to maintain appropriate glucose levels will improve Outcome: Progressing   Problem: Nutritional: Goal: Maintenance of adequate nutrition will improve Outcome: Progressing   Problem: Clinical Measurements: Goal: Ability to maintain clinical measurements within normal limits will improve Outcome: Progressing Goal: Will remain free from infection Outcome: Progressing Goal: Diagnostic test results will improve Outcome: Progressing   Problem: Nutrition: Goal: Adequate nutrition will be maintained Outcome: Progressing   Problem: Coping: Goal: Level of anxiety will decrease Outcome: Progressing   Problem: Elimination: Goal: Will not experience complications related to bowel motility Outcome: Progressing

## 2024-01-15 NOTE — Plan of Care (Signed)

## 2024-01-15 NOTE — Progress Notes (Signed)
 PROGRESS NOTE    Leroy Simmons  WUJ:[id number] DOB: 1971-04-09 DOA: 01/09/2024 PCP: Grayce Sessions, NP    Chief Complaint  Patient presents with   Headache   Chills    Brief Narrative:  Patient 53 year old gentleman history of hypertension, CHF, diabetes type 2, CKD stage IIIb, anxiety/depression, history of DVT not on anticoagulation recurrent falls presenting with headaches, chills, cough, congestion, nausea, vomiting, diarrhea.  Patient seen in the ED noted to be in acute hypoxic respiratory failure requiring O2, VQ scan done concerning for PE, CT chest done concerning for multifocal pneumonia versus volume overload.  Patient placed on IV heparin and empiric IV antibiotics.  Due to patient's worsening renal function and ongoing volume overload, nephrology consulted.    Assessment & Plan:   Principal Problem:   Pulmonary embolism (HCC) Active Problems:   Acute on chronic diastolic CHF (congestive heart failure) (HCC)   NSTEMI (non-ST elevated myocardial infarction) (HCC)   Acute kidney injury superimposed on CKD (HCC)   Uncontrolled type 2 diabetes mellitus with hyperglycemia, with long-term current use of insulin (HCC)   Deep venous thrombosis (DVT) of left peroneal vein (HCC)   Anemia of chronic disease   Obesity (BMI 30-39.9)   Hypokalemia   Diabetes mellitus (HCC)   Anxiety and depression   Coronary artery disease involving native coronary artery of native heart without angina pectoris   CKD (chronic kidney disease) stage 4, GFR 15-29 ml/min (HCC)   Acute respiratory failure with hypoxia (HCC)  #1 acute hypoxic respiratory failure with hypoxia likely multifactorial secondary to acute PE/multifocal pneumonia versus acute diastolic CHF exacerbation. -Patient noted to present with cough, congestion, shortness of breath, noted to be hypoxic with sats of 86% on room air on presentation.  Patient history of DVT. -VQ scan done positive for large single segmental perfusion  defect within the right upper lobe compatible with PE. -Lower extremity Dopplers done negative for DVT. -CT chest without contrast done nodularity throughout both lungs with a basilar predominant distribution.  Favor multifocal pneumonia.  Smooth septal thickening suspicious for pulmonary edema.  Mild mediastinal adenopathy likely reactive.  Cardiomegaly with coronary artery calcifications and small pericardial effusion.  Dependent skin thickening with area of subcutaneous edema may represent cellulitis.  No obvious drainable collection in the absence of IV contrast. -2D echo with EF of 60 to 65%, and WMA, normal right ventricular systolic function, moderately elevated pulmonary artery systolic pressure.   -Patient with significant lower extremity edema on examination and diffuse crackles noted. -Patient also with hypoalbuminemia. -Urine Legionella antigen negative, urine pneumococcus antigen pending. -Was initially on IV vancomycin and IV Zosyn and transitioned to IV Rocephin and azithromycin due to worsening renal function.  -Discontinued azithromycin. -Transition from IV Rocephin to Augmentin to complete a 7-day course of antibiotic treatment. -Was on IV heparin and transitioned to Eliquis. -IV Lasix dose increased to 120 mg every 12 hours as recommended per nephrology.  -Strict I's and O's, daily weights.  2.  Right upper lobe PE -Patient with some clinical improvement.   -Hypoxia slowly improving.   -Was on IV heparin and has been transitioned to Eliquis.  -Patient with prior history of DVT, now with acute PE, will likely require lifetime anticoagulation. -Outpatient follow-up with hematology.  3.  Poorly controlled diabetes mellitus type 2 -Hemoglobin A1c 10.8 (09/09/2023). -Repeat hemoglobin A1c 9.3. -Tolerating current diet.  - CBG 168. -Per med rec patient on Lantus 45 units twice daily and NovoLog meal coverage insulin 5 units 3  times daily. -Continue Semglee 20 units twice daily,  SSI. -Diabetic coordinator following.  4.  Diarrhea -Likely secondary to viral gastroenteritis. -C. difficile PCR negative. -GI pathogen panel negative. -Diarrhea improved.   - Scheduled Imodium discontinued currently on Imodium as needed.   5.  Hypertension - Continue Norvasc 10 mg daily.   - On IV Lasix.   - Continue to hold home regimen ACE inhibitor due to worsening renal function.   6.  AKI on CKD stage IIIb/IV -Patient noted to be volume overloaded on examination which is worsened over the past 24 hours.   -Renal function trending up with diuresis.   -Renal ultrasound consistent with medical renal disease, negative for hydronephrosis or stones.  -Urine sodium of 57, urine creatinine of 64.   -Creatinine was trending up and starting to trend back down with creatinine currently at 3.45 from 4.04 from 4.43 from 4.15 from 2.62 on admission. -Creatinine noted to be 2.28 on 09/17/2023. -Patient volume overloaded on examination and was on IV Lasix 80 mg every 12 hours and dose increased to 120 mg IV every 12 hours per nephrology. -Patient with urine output of 4.125 L over the past 24 hours. -Patient seen in consultation by nephrology who are following and feel no indication for dialysis at this time and noted that patient has a protein creatinine ratio over 9 g likely chronic in nature. -Status post IV albumin every 6 hours x 1 day.  -Nephrology following and appreciate input and recommendations.  7.  Hypokalemia -Likely secondary to diuresis and GI losses. -Repleted. - Potassium at 3.6. - Repeat labs in the AM.   DVT prophylaxis: Heparin GTT>>>> Eliquis Code Status: Full Family Communication: No family at bedside. Disposition: Likely home when clinically improved and cleared by nephrology.  Status is: Inpatient The patient will require care spanning > 2 midnights and should be moved to inpatient because: Severity of illness   Consultants:  Nephrology: Dr. Christianne Cowper  01/11/2024  Procedures:  CT chest without contrast 01/09/2024 CT abdomen and pelvis 01/09/2024 Chest x-ray 01/09/2024 VQ scan 01/09/2024 2D echo pending Lower extremity Dopplers 01/09/2024 Renal ultrasound 01/11/2024  Antimicrobials:  Anti-infectives (From admission, onward)    Start     Dose/Rate Route Frequency Ordered Stop   01/11/24 2200  vancomycin (VANCOREADY) IVPB 1750 mg/350 mL  Status:  Discontinued        1,750 mg 175 mL/hr over 120 Minutes Intravenous Every 48 hours 01/09/24 2351 01/11/24 1217   01/11/24 1300  cefTRIAXone (ROCEPHIN) 2 g in sodium chloride 0.9 % 100 mL IVPB        2 g 200 mL/hr over 30 Minutes Intravenous Daily 01/11/24 1217     01/11/24 1300  azithromycin (ZITHROMAX) 500 mg in sodium chloride 0.9 % 250 mL IVPB  Status:  Discontinued        500 mg 250 mL/hr over 60 Minutes Intravenous Daily 01/11/24 1217 01/12/24 1929   01/10/24 0000  piperacillin-tazobactam (ZOSYN) IVPB 3.375 g  Status:  Discontinued        3.375 g 12.5 mL/hr over 240 Minutes Intravenous Every 8 hours 01/09/24 2347 01/11/24 1217   01/10/24 0000  vancomycin (VANCOREADY) IVPB 2000 mg/400 mL        2,000 mg 200 mL/hr over 120 Minutes Intravenous  Once 01/09/24 2348 01/10/24 0239   01/09/24 1200  cefTRIAXone (ROCEPHIN) 1 g in sodium chloride 0.9 % 100 mL IVPB        1 g 200 mL/hr over 30 Minutes Intravenous  Once 01/09/24 1154 01/09/24 1233   01/09/24 1200  azithromycin (ZITHROMAX) 500 mg in sodium chloride 0.9 % 250 mL IVPB        500 mg 250 mL/hr over 60 Minutes Intravenous  Once 01/09/24 1154 01/09/24 1504         Subjective: Laying in bed.  No chest pain.  Shortness of breath improving.  Diarrhea improved.  Good urine output.  Tolerating current diet.    Objective: Vitals:   01/14/24 2333 01/15/24 0400 01/15/24 0429 01/15/24 0740  BP: 138/73 (!) 153/97  (!) 155/99  Pulse: 91 88 99 95  Resp: 20     Temp: 98.3 F (36.8 C) 97.9 F (36.6 C)    TempSrc: Oral Oral    SpO2: 98% 98%     Weight:   109 kg   Height:        Intake/Output Summary (Last 24 hours) at 01/15/2024 0944 Last data filed at 01/15/2024 0724 Gross per 24 hour  Intake 1164 ml  Output 4625 ml  Net -3461 ml   Filed Weights   01/13/24 0654 01/14/24 0500 01/15/24 0429  Weight: 112.1 kg 110.4 kg 109 kg    Examination:  General exam: NAD Respiratory system: Improving bibasilar crackles.  No wheezing, no rhonchi.  Fair air movement.  Speaking in full sentences.   Cardiovascular system: Regular rate rhythm no murmurs rubs or gallops.  2-3+ bilateral lower extremity edema. Gastrointestinal system: Abdomen is soft, obese, nontender, nondistended, positive bowel sounds.  No rebound.  No guarding.  Central nervous system: Alert and oriented. No focal neurological deficits. Extremities: 2-3+ bilateral lower extremity edema.   Skin: No rashes, lesions or ulcers Psychiatry: Judgement and insight appear fair. Mood & affect appropriate.     Data Reviewed: I have personally reviewed following labs and imaging studies  CBC: Recent Labs  Lab 01/09/24 0534 01/10/24 0508 01/11/24 0429 01/12/24 0412 01/13/24 0449 01/14/24 0354 01/15/24 0420  WBC 6.6   < > 6.8 6.3 5.5 4.6 6.9  NEUTROABS 4.9  --   --   --   --   --   --   HGB 13.9   < > 10.0* 12.9* 11.3* 11.2* 13.6  HCT 42.0   < > 29.2* 38.6* 32.6* 33.5* 40.1  MCV 89.9   < > 87.4 86.0 85.3 86.6 85.7  PLT 246   < > 174 192 194 217 331   < > = values in this interval not displayed.    Basic Metabolic Panel: Recent Labs  Lab 01/11/24 0429 01/12/24 0412 01/13/24 0449 01/14/24 0354 01/15/24 0420  NA 134* 135 135 136 138  K 3.9 3.3* 3.4* 3.5 3.6  CL 102 105 103 104 104  CO2 17* 19* 20* 19* 22  GLUCOSE 150* 95 104* 216* 187*  BUN 46* 46* 49* 56* 55*  CREATININE 3.79* 4.15* 4.43* 4.04* 3.45*  CALCIUM 7.9* 8.5* 8.1* 8.1* 8.6*  PHOS 3.5 4.2 3.9 4.0 2.9    GFR: Estimated Creatinine Clearance: 30.7 mL/min (A) (by C-G formula based on SCr of 3.45  mg/dL (H)).  Liver Function Tests: Recent Labs  Lab 01/09/24 0534 01/10/24 0508 01/11/24 0429 01/12/24 0412 01/13/24 0449 01/14/24 0354 01/15/24 0420  AST 36 39  --   --   --   --   --   ALT 27 28  --   --   --   --   --   ALKPHOS 107 92  --   --   --   --   --  BILITOT 0.7 0.9  --   --   --   --   --   PROT 5.9* 5.4*  --   --   --   --   --   ALBUMIN 2.0* 1.7* 2.0* 1.8* 2.2* 2.1* 2.5*    CBG: Recent Labs  Lab 01/14/24 0555 01/14/24 1155 01/14/24 1613 01/14/24 2148 01/15/24 0637  GLUCAP 125* 169* 242* 303* 168*     Recent Results (from the past 240 hours)  Resp panel by RT-PCR (RSV, Flu A&B, Covid) Anterior Nasal Swab     Status: None   Collection Time: 01/09/24  5:23 AM   Specimen: Anterior Nasal Swab  Result Value Ref Range Status   SARS Coronavirus 2 by RT PCR NEGATIVE NEGATIVE Final   Influenza A by PCR NEGATIVE NEGATIVE Final   Influenza B by PCR NEGATIVE NEGATIVE Final    Comment: (NOTE) The Xpert Xpress SARS-CoV-2/FLU/RSV plus assay is intended as an aid in the diagnosis of influenza from Nasopharyngeal swab specimens and should not be used as a sole basis for treatment. Nasal washings and aspirates are unacceptable for Xpert Xpress SARS-CoV-2/FLU/RSV testing.  Fact Sheet for Patients: BloggerCourse.com  Fact Sheet for Healthcare Providers: SeriousBroker.it  This test is not yet approved or cleared by the [place] FDA and has been authorized for detection and/or diagnosis of SARS-CoV-2 by FDA under an Emergency Use Authorization (EUA). This EUA will remain in effect (meaning this test can be used) for the duration of the COVID-19 declaration under Section 564(b)(1) of the Act, 21 U.S.C. section 360bbb-3(b)(1), unless the authorization is terminated or revoked.     Resp Syncytial Virus by PCR NEGATIVE NEGATIVE Final    Comment: (NOTE) Fact Sheet for  Patients: BloggerCourse.com  Fact Sheet for Healthcare Providers: SeriousBroker.it  This test is not yet approved or cleared by the [place] FDA and has been authorized for detection and/or diagnosis of SARS-CoV-2 by FDA under an Emergency Use Authorization (EUA). This EUA will remain in effect (meaning this test can be used) for the duration of the COVID-19 declaration under Section 564(b)(1) of the Act, 21 U.S.C. section 360bbb-3(b)(1), unless the authorization is terminated or revoked.  Performed at [facility], [address], [place], [place] [address]   Culture, blood (routine x 2)     Status: None   Collection Time: 01/09/24  8:25 AM   Specimen: BLOOD LEFT FOREARM  Result Value Ref Range Status   Specimen Description BLOOD LEFT FOREARM  Final   Special Requests   Final    BOTTLES DRAWN AEROBIC AND ANAEROBIC Blood Culture results may not be optimal due to an inadequate volume of blood received in culture bottles   Culture   Final    NO GROWTH 5 DAYS Performed at [facility], [address] [place], [place] [address]    Report Status 01/14/2024 FINAL  Final  Culture, blood (routine x 2)     Status: None   Collection Time: 01/09/24  9:43 AM   Specimen: BLOOD LEFT HAND  Result Value Ref Range Status   Specimen Description BLOOD LEFT HAND  Final   Special Requests   Final    BOTTLES DRAWN AEROBIC AND ANAEROBIC Blood Culture results may not be optimal due to an inadequate volume of blood received in culture bottles   Culture   Final    NO GROWTH 5 DAYS Performed at [facility], [address] [place], [place] [address]  Report Status 01/14/2024 FINAL  Final  Gastrointestinal Panel by PCR , Stool     Status: None   Collection Time: 01/09/24  5:03 PM   Specimen: Stool  Result Value Ref Range Status   Campylobacter species NOT DETECTED NOT DETECTED Final   Plesimonas shigelloides NOT DETECTED  NOT DETECTED Final   Salmonella species NOT DETECTED NOT DETECTED Final   Yersinia enterocolitica NOT DETECTED NOT DETECTED Final   Vibrio species NOT DETECTED NOT DETECTED Final   Vibrio cholerae NOT DETECTED NOT DETECTED Final   Enteroaggregative E coli (EAEC) NOT DETECTED NOT DETECTED Final   Enteropathogenic E coli (EPEC) NOT DETECTED NOT DETECTED Final   Enterotoxigenic E coli (ETEC) NOT DETECTED NOT DETECTED Final   Shiga like toxin producing E coli (STEC) NOT DETECTED NOT DETECTED Final   Shigella/Enteroinvasive E coli (EIEC) NOT DETECTED NOT DETECTED Final   Cryptosporidium NOT DETECTED NOT DETECTED Final   Cyclospora cayetanensis NOT DETECTED NOT DETECTED Final   Entamoeba histolytica NOT DETECTED NOT DETECTED Final   Giardia lamblia NOT DETECTED NOT DETECTED Final   Adenovirus F40/41 NOT DETECTED NOT DETECTED Final   Astrovirus NOT DETECTED NOT DETECTED Final   Norovirus GI/GII NOT DETECTED NOT DETECTED Final   Rotavirus A NOT DETECTED NOT DETECTED Final   Sapovirus (I, II, IV, and V) NOT DETECTED NOT DETECTED Final    Comment: Performed at [facility], [address] [place], [place] [address]  C Difficile Quick Screen w PCR reflex     Status: None   Collection Time: 01/09/24  5:03 PM   Specimen: Stool  Result Value Ref Range Status   C Diff antigen NEGATIVE NEGATIVE Final   C Diff toxin NEGATIVE NEGATIVE Final   C Diff interpretation No C. difficile detected.  Final    Comment: Performed at [facility], [address], [place], [place] [address]  MRSA Next Gen by PCR, Nasal     Status: None   Collection Time: 01/11/24  8:47 AM   Specimen: Nasal Mucosa; Nasal Swab  Result Value Ref Range Status   MRSA by PCR Next Gen NOT DETECTED NOT DETECTED Final    Comment: (NOTE) The GeneXpert MRSA Assay (FDA approved for NASAL specimens only), is one component of a comprehensive MRSA colonization surveillance program. It is not intended to diagnose MRSA infection  nor to guide or monitor treatment for MRSA infections. Test performance is not FDA approved in patients less than 47 years old. Performed at [facility], [address], [place], [place] [address]          Radiology Studies: No results found.       Scheduled Meds:  amLODipine  10 mg Oral Daily   apixaban  10 mg Oral BID   Followed by   Cecily Cohen ON 01/19/2024] apixaban  5 mg Oral BID   fluticasone  2 spray Each Nare Daily   guaiFENesin  1,200 mg Oral BID   insulin aspart  0-9 Units Subcutaneous TID WC   insulin glargine-yfgn  20 Units Subcutaneous BID   loperamide  2 mg Oral Once   loratadine  10 mg Oral Daily   pantoprazole  40 mg Oral BID   sodium bicarbonate  650 mg Oral BID   sodium chloride flush  3 mL Intravenous Q12H   sucralfate  1 g Oral TID WC & HS   thiamine  100 mg Oral Daily   Continuous Infusions:  cefTRIAXone (ROCEPHIN)  IV 2  g (01/15/24 9811)   furosemide Stopped (01/15/24 0034)     LOS: 5 days    Time spent: 40 mins    Hilda Lovings, MD [facility]   To contact the attending provider between 7A-7P or the covering provider during after hours 7P-7A, please log into the web site [email or web address] and access using universal [place] password for that web site. If you do not have the password, please call the hospital operator.  01/15/2024, 9:44 AM

## 2024-01-15 NOTE — Progress Notes (Signed)
 Mobility Specialist Progress Note:  Nurse requested Mobility Specialist to perform oxygen saturation test with pt which includes removing pt from oxygen both at rest and while ambulating.  Below are the results from that testing.     Patient Saturations on Room Air at Rest = spO2 97%  Patient Saturations on Room Air while Ambulating = sp02 93% .  Rested and performed pursed lip breathing for 1 minute with sp02 at 95%.  At end of testing pt left in room on 0  Liters of oxygen.  Reported results to nurse.   Inetta Manes Mobility Specialist  Please contact vis Secure Chat or  Rehab Office [phone]

## 2024-01-15 NOTE — Progress Notes (Signed)
 Mobility Specialist Progress Note:    01/15/24 1200  Mobility  Activity Ambulated with assistance in hallway  Level of Assistance Contact guard assist, steadying assist  Assistive Device Other (Comment) (Hand rails)  Distance Ambulated (ft) 100 ft  Activity Response Tolerated well  Mobility Referral Yes  Mobility visit 1 Mobility  Mobility Specialist Start Time (ACUTE ONLY) 1020  Mobility Specialist Stop Time (ACUTE ONLY) 1035  Mobility Specialist Time Calculation (min) (ACUTE ONLY) 15 min   Pt received in bed hesitant but agreeable to mobility. Pt stated he was very "groggy" today but otherwise no c/o. Ambulated on RA throughout ambulation, VSS. Returned to room w/o fault. Call bell and personal belongings in reach. Left seated EOB. RN aware.  Inetta Manes Mobility Specialist  Please contact vis Secure Chat or  Rehab Office [phone]

## 2024-01-15 NOTE — Progress Notes (Signed)
 Patient ID: Leroy Simmons, male   DOB: [birth year], 53 y.o.   MRN: [id number] S: No new complaints.  O:BP (!) 146/94 (BP Location: Left Arm)   Pulse 89   Temp 98.3 F (36.8 C) (Oral)   Resp 20   Ht 5\' 8"  (1.727 m)   Wt 109 kg   SpO2 95%   BMI 36.54 kg/m   Intake/Output Summary (Last 24 hours) at 01/15/2024 1336 Last data filed at 01/15/2024 1244 Gross per 24 hour  Intake 724 ml  Output 4575 ml  Net -3851 ml   Intake/Output: I/O last 3 completed shifts: In: 1644 [P.O.:1320; IV Piggyback:324] Out: 6175 [Urine:6175]  Intake/Output this shift:  Total I/O In: -  Out: 1650 [Urine:1650] Weight change: -1.4 kg Gen:NAD CVS: RRR Resp:CTA Abd: +BS, soft, NT/ND Ext: 2+ pretibial edema bilaterally  Recent Labs  Lab 01/09/24 0534 01/10/24 0508 01/11/24 0429 01/12/24 0412 01/13/24 0449 01/14/24 0354 01/15/24 0420  NA 134* 134* 134* 135 135 136 138  K 4.1 3.5 3.9 3.3* 3.4* 3.5 3.6  CL 104 104 102 105 103 104 104  CO2 19* 19* 17* 19* 20* 19* 22  GLUCOSE 199* 129* 150* 95 104* 216* 187*  BUN 35* 39* 46* 46* 49* 56* 55*  CREATININE 2.62* 3.03* 3.79* 4.15* 4.43* 4.04* 3.45*  ALBUMIN 2.0* 1.7* 2.0* 1.8* 2.2* 2.1* 2.5*  CALCIUM 8.2* 7.7* 7.9* 8.5* 8.1* 8.1* 8.6*  PHOS  --   --  3.5 4.2 3.9 4.0 2.9  AST 36 39  --   --   --   --   --   ALT 27 28  --   --   --   --   --    Liver Function Tests: Recent Labs  Lab 01/09/24 0534 01/10/24 0508 01/11/24 0429 01/13/24 0449 01/14/24 0354 01/15/24 0420  AST 36 39  --   --   --   --   ALT 27 28  --   --   --   --   ALKPHOS 107 92  --   --   --   --   BILITOT 0.7 0.9  --   --   --   --   PROT 5.9* 5.4*  --   --   --   --   ALBUMIN 2.0* 1.7*   < > 2.2* 2.1* 2.5*   < > = values in this interval not displayed.   Recent Labs  Lab 01/09/24 0534  LIPASE 33   No results for input(s): "AMMONIA" in the last 168 hours. CBC: Recent Labs  Lab 01/09/24 0534 01/10/24 0508 01/11/24 0429 01/12/24 0412 01/13/24 0449 01/14/24 0354  01/15/24 0420  WBC 6.6   < > 6.8 6.3 5.5 4.6 6.9  NEUTROABS 4.9  --   --   --   --   --   --   HGB 13.9   < > 10.0* 12.9* 11.3* 11.2* 13.6  HCT 42.0   < > 29.2* 38.6* 32.6* 33.5* 40.1  MCV 89.9   < > 87.4 86.0 85.3 86.6 85.7  PLT 246   < > 174 192 194 217 331   < > = values in this interval not displayed.   Cardiac Enzymes: No results for input(s): "CKTOTAL", "CKMB", "CKMBINDEX", "TROPONINI" in the last 168 hours. CBG: Recent Labs  Lab 01/14/24 1155 01/14/24 1613 01/14/24 2148 01/15/24 0637 01/15/24 1226  GLUCAP 169* 242* 303* 168* 145*    Iron Studies: No results for input(s): "  IRON", "TIBC", "TRANSFERRIN", "FERRITIN" in the last 72 hours. Studies/Results: No results found.  amLODipine  10 mg Oral Daily   [START ON 01/16/2024] amoxicillin-clavulanate  1 tablet Oral Q12H   apixaban  10 mg Oral BID   Followed by   Cecily Cohen ON 01/19/2024] apixaban  5 mg Oral BID   fluticasone  2 spray Each Nare Daily   guaiFENesin  1,200 mg Oral BID   insulin aspart  0-9 Units Subcutaneous TID WC   insulin aspart  4 Units Subcutaneous TID WC   insulin glargine-yfgn  20 Units Subcutaneous BID   loperamide  2 mg Oral Once   loratadine  10 mg Oral Daily   pantoprazole  40 mg Oral BID   sodium bicarbonate  650 mg Oral BID   sodium chloride flush  3 mL Intravenous Q12H   sucralfate  1 g Oral TID WC & HS   thiamine  100 mg Oral Daily    BMET    Component Value Date/Time   NA 138 01/15/2024 0420   NA 139 03/29/2022 1059   K 3.6 01/15/2024 0420   CL 104 01/15/2024 0420   CO2 22 01/15/2024 0420   GLUCOSE 187 (H) 01/15/2024 0420   BUN 55 (H) 01/15/2024 0420   BUN 29 (H) 03/29/2022 1059   CREATININE 3.45 (H) 01/15/2024 0420   CALCIUM 8.6 (L) 01/15/2024 0420   GFRNONAA 21 (L) 01/15/2024 0420   GFRAA  10/26/2010 1143    >60        The eGFR has been calculated using the MDRD equation. This calculation has not been validated in all clinical situations. eGFR's persistently <60 mL/min  signify possible Chronic Kidney Disease.   CBC    Component Value Date/Time   WBC 6.9 01/15/2024 0420   RBC 4.68 01/15/2024 0420   HGB 13.6 01/15/2024 0420   HCT 40.1 01/15/2024 0420   PLT 331 01/15/2024 0420   MCV 85.7 01/15/2024 0420   MCH 29.1 01/15/2024 0420   MCHC 33.9 01/15/2024 0420   RDW 12.6 01/15/2024 0420   LYMPHSABS 0.8 01/09/2024 0534   MONOABS 0.8 01/09/2024 0534   EOSABS 0.0 01/09/2024 0534   BASOSABS 0.0 01/09/2024 0534     Assessment/Plan:  AKI/CKD stage IV - in setting of PE and volume overload.  Underlying CKD likely due to longstanding, poorly controlled DM.  BUN/Cr slowly improving towards his baseline.  Continue with current regimen for now.  Will need to f/u with our office after his discharge.  Acute PE - no evidence of DVT by dopplers.  Currently on Eliquis Acute on chronic HFpEF - responding to IV lasix Multifocal pneumonia - abx per primary svc DM type 2 - poorly controlled.  HTN/volume - as above.  Responding to lasix. Metabolic acidosis - on sodium bicarb Hypokalemia - cont with supplementation  Benjamin Brands, MD [facility] [phone]

## 2024-01-16 DIAGNOSIS — I5033 Acute on chronic diastolic (congestive) heart failure: Secondary | ICD-10-CM | POA: Diagnosis not present

## 2024-01-16 DIAGNOSIS — J9601 Acute respiratory failure with hypoxia: Secondary | ICD-10-CM | POA: Diagnosis not present

## 2024-01-16 DIAGNOSIS — D638 Anemia in other chronic diseases classified elsewhere: Secondary | ICD-10-CM | POA: Diagnosis not present

## 2024-01-16 DIAGNOSIS — I2699 Other pulmonary embolism without acute cor pulmonale: Secondary | ICD-10-CM | POA: Diagnosis not present

## 2024-01-16 LAB — RENAL FUNCTION PANEL
Albumin: 2.3 g/dL — ABNORMAL LOW (ref 3.5–5.0)
Anion gap: 11 (ref 5–15)
BUN: 53 mg/dL — ABNORMAL HIGH (ref 6–20)
CO2: 25 mmol/L (ref 22–32)
Calcium: 8.3 mg/dL — ABNORMAL LOW (ref 8.9–10.3)
Chloride: 102 mmol/L (ref 98–111)
Creatinine, Ser: 3.05 mg/dL — ABNORMAL HIGH (ref 0.61–1.24)
GFR, Estimated: 24 mL/min — ABNORMAL LOW (ref >60)
Glucose, Bld: 197 mg/dL — ABNORMAL HIGH (ref 70–99)
Phosphorus: 2.8 mg/dL (ref 2.5–4.6)
Potassium: 3.3 mmol/L — ABNORMAL LOW (ref 3.5–5.1)
Sodium: 138 mmol/L (ref 135–145)

## 2024-01-16 LAB — GLUCOSE, CAPILLARY
Glucose-Capillary: 147 mg/dL — ABNORMAL HIGH (ref 70–99)
Glucose-Capillary: 205 mg/dL — ABNORMAL HIGH (ref 70–99)
Glucose-Capillary: 302 mg/dL — ABNORMAL HIGH (ref 70–99)
Glucose-Capillary: 284 mg/dL — ABNORMAL HIGH (ref 70–99)

## 2024-01-16 LAB — CBC
HCT: 39.3 % (ref 39.0–52.0)
Hemoglobin: 13.5 g/dL (ref 13.0–17.0)
MCH: 29.2 pg (ref 26.0–34.0)
MCHC: 34.4 g/dL (ref 30.0–36.0)
MCV: 85.1 fL (ref 80.0–100.0)
Platelets: 370 10*3/uL (ref 150–400)
RBC: 4.62 MIL/uL (ref 4.22–5.81)
RDW: 12.4 % (ref 11.5–15.5)
WBC: 7.5 10*3/uL (ref 4.0–10.5)
nRBC: 0 % (ref 0.0–0.2)

## 2024-01-16 MED ORDER — INSULIN ASPART 100 UNIT/ML IJ SOLN
6.0000 [IU] | Freq: Three times a day (TID) | INTRAMUSCULAR | Status: DC
  Administered 2024-01-17 – 2024-01-19 (×8): 6 [IU] via SUBCUTANEOUS

## 2024-01-16 MED ORDER — POTASSIUM CHLORIDE CRYS ER 20 MEQ PO TBCR
40.0000 meq | EXTENDED_RELEASE_TABLET | Freq: Once | ORAL | Status: AC
  Administered 2024-01-16: 40 meq via ORAL
  Filled 2024-01-16: qty 2

## 2024-01-16 MED ORDER — INSULIN GLARGINE-YFGN 100 UNIT/ML ~~LOC~~ SOLN
25.0000 [IU] | Freq: Two times a day (BID) | SUBCUTANEOUS | Status: DC
  Administered 2024-01-16 – 2024-01-18 (×4): 25 [IU] via SUBCUTANEOUS
  Filled 2024-01-16 (×5): qty 0.25

## 2024-01-16 NOTE — Progress Notes (Addendum)
 PROGRESS NOTE    Leroy Simmons  YNW:[id number] DOB: [birth year] DOA: 01/09/2024 PCP: Grayce Sessions, NP    Chief Complaint  Patient presents with   Headache   Chills    Brief Narrative:  Patient 53 year old gentleman history of hypertension, CHF, diabetes type 2, CKD stage IIIb, anxiety/depression, history of DVT not on anticoagulation recurrent falls presenting with headaches, chills, cough, congestion, nausea, vomiting, diarrhea.  Patient seen in the ED noted to be in acute hypoxic respiratory failure requiring O2, VQ scan done concerning for PE, CT chest done concerning for multifocal pneumonia versus volume overload.  Patient placed on IV heparin and empiric IV antibiotics.  Due to patient's worsening renal function and ongoing volume overload, nephrology consulted.    Assessment & Plan:   Principal Problem:   Pulmonary embolism (HCC) Active Problems:   Acute on chronic diastolic CHF (congestive heart failure) (HCC)   NSTEMI (non-ST elevated myocardial infarction) (HCC)   Acute kidney injury superimposed on CKD (HCC)   Uncontrolled type 2 diabetes mellitus with hyperglycemia, with long-term current use of insulin (HCC)   Deep venous thrombosis (DVT) of left peroneal vein (HCC)   Anemia of chronic disease   Obesity (BMI 30-39.9)   Hypokalemia   Diabetes mellitus (HCC)   Anxiety and depression   Coronary artery disease involving native coronary artery of native heart without angina pectoris   CKD (chronic kidney disease) stage 4, GFR 15-29 ml/min (HCC)   Acute respiratory failure with hypoxia (HCC)  #1 acute hypoxic respiratory failure with hypoxia likely multifactorial secondary to acute PE/multifocal pneumonia versus acute diastolic CHF exacerbation. -Patient noted to present with cough, congestion, shortness of breath, noted to be hypoxic with sats of 86% on room air on presentation.  Patient history of DVT. -VQ scan done positive for large single segmental perfusion  defect within the right upper lobe compatible with PE. -Lower extremity Dopplers done negative for DVT. -CT chest without contrast done nodularity throughout both lungs with a basilar predominant distribution.  Favor multifocal pneumonia.  Smooth septal thickening suspicious for pulmonary edema.  Mild mediastinal adenopathy likely reactive.  Cardiomegaly with coronary artery calcifications and small pericardial effusion.  Dependent skin thickening with area of subcutaneous edema may represent cellulitis.  No obvious drainable collection in the absence of IV contrast. -2D echo with EF of 60 to 65%, and WMA, normal right ventricular systolic function, moderately elevated pulmonary artery systolic pressure.   -Patient with significant lower extremity edema on examination and diffuse crackles noted. -Patient also with hypoalbuminemia. -Urine Legionella antigen negative, urine pneumococcus antigen pending. -Was initially on IV vancomycin and IV Zosyn and transitioned to IV Rocephin and azithromycin due to worsening renal function.  -Discontinued azithromycin. -Transitioned from IV Rocephin to Augmentin to complete a 7-day course of antibiotic treatment. -Was on IV heparin and transitioned to Eliquis. -IV Lasix dose increased to 120 mg every 12 hours as recommended per nephrology.  Roland Rack boots. -Strict I's and O's, daily weights.  2.  Right upper lobe PE -Patient with some clinical improvement.   - Hypoxia improved currently with sats of 92 to 96% on room air.  -Was on IV heparin and has been transitioned to Eliquis.  -Patient with prior history of DVT, now with acute PE, will likely require lifetime anticoagulation. -Outpatient follow-up with hematology.  3.  Poorly controlled diabetes mellitus type 2 -Hemoglobin A1c 10.8 (09/09/2023). -Repeat hemoglobin A1c 9.3. -Tolerating current diet.  - CBG 147. -Per med rec patient on Lantus 45  units twice daily and NovoLog meal coverage insulin 5 units 3  times daily. - Increase Semglee to 25 units twice daily.  - Increase meal coverage NovoLog to 6 units 3 times daily. - Continue SSI.  -Diabetic coordinator following.  4.  Diarrhea -Likely secondary to viral gastroenteritis. -C. difficile PCR negative. -GI pathogen panel negative. -Diarrhea improved.   - Scheduled Imodium discontinued currently on Imodium as needed.  - Supportive care.  5.  Hypertension - Norvasc 10 mg daily.   - On IV Lasix.   - Continue to hold home regimen ACE inhibitor.   6.  AKI on CKD stage IIIb/IV -Patient noted to be volume overloaded on examination which is worsened over the past 24 hours.   -Renal function trending up with diuresis.   -Renal ultrasound consistent with medical renal disease, negative for hydronephrosis or stones.  -Urine sodium of 57, urine creatinine of 64.   -Creatinine was trending up and starting to trend back down with creatinine currently at 3.05 from 3.45 from 4.04 from 4.43 from 4.15 from 2.62 on admission. -Creatinine noted to be 2.28 on 09/17/2023. -Patient volume overloaded on examination and was on IV Lasix 80 mg every 12 hours and dose increased to 120 mg IV every 12 hours per nephrology. -Patient with urine output of 5.150 L over the past 24 hours. -Patient seen in consultation by nephrology who are following and feel no indication for dialysis at this time and noted that patient has a protein creatinine ratio over 9 g likely chronic in nature. -Status post IV albumin every 6 hours x 1 day.  -Will need outpatient follow-up with nephrology on discharge. -Nephrology following and appreciate input and recommendations.  7.  Hypokalemia -Likely secondary to diuresis and GI losses. - Potassium at 3.3.   -KDur 40 mEq p.o. x 1. - Repeat labs in the AM.   DVT prophylaxis: Heparin GTT>>>> Eliquis Code Status: Full Family Communication: No family at bedside. Disposition: Likely home when clinically improved and cleared by  nephrology.  Status is: Inpatient The patient will require care spanning > 2 midnights and should be moved to inpatient because: Severity of illness   Consultants:  Nephrology: Dr. Christianne Cowper 01/11/2024  Procedures:  CT chest without contrast 01/09/2024 CT abdomen and pelvis 01/09/2024 Chest x-ray 01/09/2024 VQ scan 01/09/2024 2D echo pending Lower extremity Dopplers 01/09/2024 Renal ultrasound 01/11/2024  Antimicrobials:  Anti-infectives (From admission, onward)    Start     Dose/Rate Route Frequency Ordered Stop   01/16/24 1000  amoxicillin-clavulanate (AUGMENTIN) 875-125 MG per tablet 1 tablet        1 tablet Oral Every 12 hours 01/15/24 0945 01/18/24 0959   01/11/24 2200  vancomycin (VANCOREADY) IVPB 1750 mg/350 mL  Status:  Discontinued        1,750 mg 175 mL/hr over 120 Minutes Intravenous Every 48 hours 01/09/24 2351 01/11/24 1217   01/11/24 1300  cefTRIAXone (ROCEPHIN) 2 g in sodium chloride 0.9 % 100 mL IVPB  Status:  Discontinued        2 g 200 mL/hr over 30 Minutes Intravenous Daily 01/11/24 1217 01/15/24 0945   01/11/24 1300  azithromycin (ZITHROMAX) 500 mg in sodium chloride 0.9 % 250 mL IVPB  Status:  Discontinued        500 mg 250 mL/hr over 60 Minutes Intravenous Daily 01/11/24 1217 01/12/24 1929   01/10/24 0000  piperacillin-tazobactam (ZOSYN) IVPB 3.375 g  Status:  Discontinued        3.375 g 12.5  mL/hr over 240 Minutes Intravenous Every 8 hours 01/09/24 2347 01/11/24 1217   01/10/24 0000  vancomycin (VANCOREADY) IVPB 2000 mg/400 mL        2,000 mg 200 mL/hr over 120 Minutes Intravenous  Once 01/09/24 2348 01/10/24 0239   01/09/24 1200  cefTRIAXone (ROCEPHIN) 1 g in sodium chloride 0.9 % 100 mL IVPB        1 g 200 mL/hr over 30 Minutes Intravenous  Once 01/09/24 1154 01/09/24 1233   01/09/24 1200  azithromycin (ZITHROMAX) 500 mg in sodium chloride 0.9 % 250 mL IVPB        500 mg 250 mL/hr over 60 Minutes Intravenous  Once 01/09/24 1154 01/09/24 1504          Subjective: Patient seen at the side of the bed.  Feels shortness of breath is slowly improving.  Still with significant lower extremity edema.  Asking whether Unna boots will be helpful for the lower extremity edema.  Denies any chest pain.  Good urine output.  Diarrhea improved.  Tolerating current diet.  Objective: Vitals:   01/15/24 2257 01/16/24 0319 01/16/24 0555 01/16/24 0900  BP: (!) 157/99 (!) 147/91  (!) 147/96  Pulse: 96 96 94 94  Resp: 14 18 (!) 23 16  Temp: 97.6 F (36.4 C) 98.2 F (36.8 C)  97.6 F (36.4 C)  TempSrc: Oral Oral  Oral  SpO2: 95% 96%  92%  Weight:   106.8 kg   Height:        Intake/Output Summary (Last 24 hours) at 01/16/2024 1204 Last data filed at 01/16/2024 0554 Gross per 24 hour  Intake 240 ml  Output 3850 ml  Net -3610 ml   Filed Weights   01/14/24 0500 01/15/24 0429 01/16/24 0555  Weight: 110.4 kg 109 kg 106.8 kg    Examination:  General exam: NAD Respiratory system: Decreasing bibasilar crackles.  No wheezing.  No rhonchi.  Fair air movement.  Speaking in full sentences.   Cardiovascular system: RRR no murmurs rubs or gallops.  No JVD.  2-3+ bilateral lower extremity edema.  Gastrointestinal system: Abdomen is soft, obese, nontender, nondistended, positive bowel sounds.  No rebound.  No guarding.  Central nervous system: Alert and oriented. No focal neurological deficits. Extremities: 2-3+ bilateral lower extremity edema.   Skin: No rashes, lesions or ulcers Psychiatry: Judgement and insight appear fair. Mood & affect appropriate.     Data Reviewed: I have personally reviewed following labs and imaging studies  CBC: Recent Labs  Lab 01/12/24 0412 01/13/24 0449 01/14/24 0354 01/15/24 0420 01/16/24 0414  WBC 6.3 5.5 4.6 6.9 7.5  HGB 12.9* 11.3* 11.2* 13.6 13.5  HCT 38.6* 32.6* 33.5* 40.1 39.3  MCV 86.0 85.3 86.6 85.7 85.1  PLT 192 194 217 331 370    Basic Metabolic Panel: Recent Labs  Lab 01/12/24 0412 01/13/24 0449  01/14/24 0354 01/15/24 0420 01/16/24 0414  NA 135 135 136 138 138  K 3.3* 3.4* 3.5 3.6 3.3*  CL 105 103 104 104 102  CO2 19* 20* 19* 22 25  GLUCOSE 95 104* 216* 187* 197*  BUN 46* 49* 56* 55* 53*  CREATININE 4.15* 4.43* 4.04* 3.45* 3.05*  CALCIUM 8.5* 8.1* 8.1* 8.6* 8.3*  PHOS 4.2 3.9 4.0 2.9 2.8    GFR: Estimated Creatinine Clearance: 34.3 mL/min (A) (by C-G formula based on SCr of 3.05 mg/dL (H)).  Liver Function Tests: Recent Labs  Lab 01/10/24 1914 01/11/24 7829 01/12/24 5621 01/13/24 0449 01/14/24 0354 01/15/24 0420  01/16/24 0414  AST 39  --   --   --   --   --   --   ALT 28  --   --   --   --   --   --   ALKPHOS 92  --   --   --   --   --   --   BILITOT 0.9  --   --   --   --   --   --   PROT 5.4*  --   --   --   --   --   --   ALBUMIN 1.7*   < > 1.8* 2.2* 2.1* 2.5* 2.3*   < > = values in this interval not displayed.    CBG: Recent Labs  Lab 01/15/24 1226 01/15/24 1723 01/15/24 2106 01/16/24 0553 01/16/24 1140  GLUCAP 145* 231* 198* 147* 205*     Recent Results (from the past 240 hours)  Resp panel by RT-PCR (RSV, Flu A&B, Covid) Anterior Nasal Swab     Status: None   Collection Time: 01/09/24  5:23 AM   Specimen: Anterior Nasal Swab  Result Value Ref Range Status   SARS Coronavirus 2 by RT PCR NEGATIVE NEGATIVE Final   Influenza A by PCR NEGATIVE NEGATIVE Final   Influenza B by PCR NEGATIVE NEGATIVE Final    Comment: (NOTE) The Xpert Xpress SARS-CoV-2/FLU/RSV plus assay is intended as an aid in the diagnosis of influenza from Nasopharyngeal swab specimens and should not be used as a sole basis for treatment. Nasal washings and aspirates are unacceptable for Xpert Xpress SARS-CoV-2/FLU/RSV testing.  Fact Sheet for Patients: BloggerCourse.com  Fact Sheet for Healthcare Providers: SeriousBroker.it  This test is not yet approved or cleared by the [place]  FDA and has been authorized for  detection and/or diagnosis of SARS-CoV-2 by FDA under an Emergency Use Authorization (EUA). This EUA will remain in effect (meaning this test can be used) for the duration of the COVID-19 declaration under Section 564(b)(1) of the Act, 21 U.S.C. section 360bbb-3(b)(1), unless the authorization is terminated or revoked.     Resp Syncytial Virus by PCR NEGATIVE NEGATIVE Final    Comment: (NOTE) Fact Sheet for Patients: BloggerCourse.com  Fact Sheet for Healthcare Providers: SeriousBroker.it  This test is not yet approved or cleared by the [place]  FDA and has been authorized for detection and/or diagnosis of SARS-CoV-2 by FDA under an Emergency Use Authorization (EUA). This EUA will remain in effect (meaning this test can be used) for the duration of the COVID-19 declaration under Section 564(b)(1) of the Act, 21 U.S.C. section 360bbb-3(b)(1), unless the authorization is terminated or revoked.  Performed at [facility], [address] [place], [place] [address]   Culture, blood (routine x 2)     Status: None   Collection Time: 01/09/24  8:25 AM   Specimen: BLOOD LEFT FOREARM  Result Value Ref Range Status   Specimen Description BLOOD LEFT FOREARM  Final   Special Requests   Final    BOTTLES DRAWN AEROBIC AND ANAEROBIC Blood Culture results may not be optimal due to an inadequate volume of blood received in culture bottles   Culture   Final    NO GROWTH 5 DAYS Performed at [facility], [address] [place], [place] [address]    Report Status 01/14/2024 FINAL  Final  Culture, blood (routine x 2)     Status: None   Collection Time: 01/09/24  9:43 AM   Specimen: BLOOD LEFT HAND  Result Value Ref Range Status   Specimen Description BLOOD LEFT HAND  Final   Special Requests   Final    BOTTLES DRAWN AEROBIC AND ANAEROBIC Blood Culture results may not be optimal due to an inadequate volume of blood received in  culture bottles   Culture   Final    NO GROWTH 5 DAYS Performed at [facility], [address], [place], [place] [address]    Report Status 01/14/2024 FINAL  Final  Gastrointestinal Panel by PCR , Stool     Status: None   Collection Time: 01/09/24  5:03 PM   Specimen: Stool  Result Value Ref Range Status   Campylobacter species NOT DETECTED NOT DETECTED Final   Plesimonas shigelloides NOT DETECTED NOT DETECTED Final   Salmonella species NOT DETECTED NOT DETECTED Final   Yersinia enterocolitica NOT DETECTED NOT DETECTED Final   Vibrio species NOT DETECTED NOT DETECTED Final   Vibrio cholerae NOT DETECTED NOT DETECTED Final   Enteroaggregative E coli (EAEC) NOT DETECTED NOT DETECTED Final   Enteropathogenic E coli (EPEC) NOT DETECTED NOT DETECTED Final   Enterotoxigenic E coli (ETEC) NOT DETECTED NOT DETECTED Final   Shiga like toxin producing E coli (STEC) NOT DETECTED NOT DETECTED Final   Shigella/Enteroinvasive E coli (EIEC) NOT DETECTED NOT DETECTED Final   Cryptosporidium NOT DETECTED NOT DETECTED Final   Cyclospora cayetanensis NOT DETECTED NOT DETECTED Final   Entamoeba histolytica NOT DETECTED NOT DETECTED Final   Giardia lamblia NOT DETECTED NOT DETECTED Final   Adenovirus F40/41 NOT DETECTED NOT DETECTED Final   Astrovirus NOT DETECTED NOT DETECTED Final   Norovirus GI/GII NOT DETECTED NOT DETECTED Final   Rotavirus A NOT DETECTED NOT DETECTED Final   Sapovirus (I, II, IV, and V) NOT DETECTED NOT DETECTED Final    Comment: Performed at [facility], [address] [place], [place] [address]  C Difficile Quick Screen w PCR reflex     Status: None   Collection Time: 01/09/24  5:03 PM   Specimen: Stool  Result Value Ref Range Status   C Diff antigen NEGATIVE NEGATIVE Final   C Diff toxin NEGATIVE NEGATIVE Final   C Diff interpretation No C. difficile detected.  Final    Comment: Performed at [facility], [address] [place], [place] [address]   MRSA Next Gen by PCR, Nasal     Status: None   Collection Time: 01/11/24  8:47 AM   Specimen: Nasal Mucosa; Nasal Swab  Result Value Ref Range Status   MRSA by PCR Next Gen NOT DETECTED NOT DETECTED Final    Comment: (NOTE) The GeneXpert MRSA Assay (FDA approved for NASAL specimens only), is one component of a comprehensive MRSA colonization surveillance program. It is not intended to diagnose MRSA infection nor to guide or monitor treatment for MRSA infections. Test performance is not FDA approved in patients less than 45 years old. Performed at [facility], [address], [place], [place] [address]          Radiology Studies: No results found.       Scheduled Meds:  amLODipine  10 mg Oral Daily   amoxicillin-clavulanate  1 tablet Oral Q12H   apixaban  10 mg Oral BID   Followed by   Cecily Cohen ON 01/19/2024] apixaban  5 mg Oral BID   fluticasone  2 spray Each Nare Daily   guaiFENesin  1,200 mg  Oral BID   insulin aspart  0-9 Units Subcutaneous TID WC   insulin aspart  4 Units Subcutaneous TID WC   insulin glargine-yfgn  20 Units Subcutaneous BID   loperamide  2 mg Oral Once   loratadine  10 mg Oral Daily   pantoprazole  40 mg Oral BID   sodium bicarbonate  650 mg Oral BID   sodium chloride flush  3 mL Intravenous Q12H   sucralfate  1 g Oral TID WC & HS   thiamine  100 mg Oral Daily   Continuous Infusions:  furosemide 120 mg (01/16/24 0709)     LOS: 6 days    Time spent: 40 mins    Hilda Lovings, MD [facility]   To contact the attending provider between 7A-7P or the covering provider during after hours 7P-7A, please log into the web site [email or web address] and access using universal [place] password for that web site. If you do not have the password, please call the hospital operator.  01/16/2024, 12:04 PM

## 2024-01-16 NOTE — Evaluation (Signed)
 Physical Therapy Evaluation Patient Details Name: Leroy Simmons MRN: [id number] DOB: 11-28-1970 Today's Date: 01/16/2024  History of Present Illness  Pt is a 53 yr old male who presented 01/09/24 due to acute hypoxic respiratory failure. VQ scan showed positive for large single segmental perfusion defect within the right upper lobe compatible with PE and CT showed favor multifocal pneumonia. PMH: CHF, anxiety, depression, T2DM, HTN,  laparoscopic appendectomy 12/23 due to gangrenous appendicitis, NSTEMI, AKI, DVT, CAD   Clinical Impression  Pt admitted for above. PTA pt living with roommates, working, and indep without AD. Pt now presenting with generalized deconditioning and weakness due to prolonged hospital stay. Pt with c/o dizziness upon initial ambulation but diminished with increased distance. Pt with noted DOE however SpO2 > 95% on RA. Pt to benefit from acute PT and mobility team while in hospital to improved activity tolerance and advance to functional independence however I do not anticipate he will need PT post d/c.        If plan is discharge home, recommend the following: Assist for transportation   Can travel by private vehicle        Equipment Recommendations None recommended by PT  Recommendations for Other Services       Functional Status Assessment Patient has had a recent decline in their functional status and demonstrates the ability to make significant improvements in function in a reasonable and predictable amount of time.     Precautions / Restrictions Precautions Precautions: Fall Precaution/Restrictions Comments: pt with bilat compression stockings due to bilat LE edema Restrictions Weight Bearing Restrictions Per Provider Order: No      Mobility  Bed Mobility Overal bed mobility: Needs Assistance Bed Mobility: Sidelying to Sit   Sidelying to sit: Contact guard assist       General bed mobility comments: increased time, labored effort due to body  habitus, noted SOB, SPO2 > 96% on RA    Transfers Overall transfer level: Needs assistance Equipment used: None Transfers: Sit to/from Stand Sit to Stand: Contact guard assist           General transfer comment: contact guard for safety due to report of dizziness, wide base of support, increased time, placed hand on top of IV pole    Ambulation/Gait Ambulation/Gait assistance: Contact guard assist Gait Distance (Feet): 160 Feet Assistive device: IV Pole Gait Pattern/deviations: Step-through pattern, Decreased stride length, Wide base of support Gait velocity: dec Gait velocity interpretation: 1.31 - 2.62 ft/sec, indicative of limited community ambulator   General Gait Details: pt initially with c/o "whew I'm dizzy" however declined needing to sit. pt reports "it feels like I'm drunk or something" improved t/o ambulation. noted DOE, SPO2 > 95% on RA  Stairs            Wheelchair Mobility     Tilt Bed    Modified Rankin (Stroke Patients Only)       Balance Overall balance assessment: Needs assistance Sitting-balance support: Feet supported, No upper extremity supported Sitting balance-Leahy Scale: Fair     Standing balance support: Single extremity supported, During functional activity Standing balance-Leahy Scale: Fair Standing balance comment: benefit from IV pole during amb                             Pertinent Vitals/Pain Pain Assessment Pain Assessment: Faces Faces Pain Scale: Hurts a little bit Pain Location: bilat calves during amb, reports "feels like i just worked out"  Pain Descriptors / Indicators: Burning Pain Intervention(s): Monitored during session    Home Living Family/patient expects to be discharged to:: Private residence Living Arrangements: Non-relatives/Friends (4-5 housemates) Available Help at Discharge: Friend(s);Available PRN/intermittently (everyone works) Type of Home: House Home Access: Level entry       Home  Layout: Able to live on main level with bedroom/bathroom Home Equipment: Agricultural consultant (2 wheels)      Prior Function Prior Level of Function : Independent/Modified Independent;Driving;Working/employed             Mobility Comments: no AD, works in [employer] and goes into work ADLs Comments: indep     Extremity/Trunk Assessment   Upper Extremity Assessment Upper Extremity Assessment: Defer to OT evaluation    Lower Extremity Assessment Lower Extremity Assessment: Generalized weakness (bilat LE edema)    Cervical / Trunk Assessment Cervical / Trunk Assessment: Other exceptions Cervical / Trunk Exceptions: distended abdomen  Communication   Communication Communication: No apparent difficulties    Cognition Arousal: Alert Behavior During Therapy: Flat affect                           PT - Cognition Comments: pt reports "My memory is off" however no family/friends to verify baseline level of cognition Following commands: Intact       Cueing Cueing Techniques: Verbal cues     General Comments General comments (skin integrity, edema, etc.): VSS, bilat LE edema, compression stockings pulled up above knee for optimal support    Exercises     Assessment/Plan    PT Assessment Patient needs continued PT services  PT Problem List Decreased strength;Decreased activity tolerance;Decreased balance;Decreased mobility       PT Treatment Interventions DME instruction;Gait training;Stair training;Functional mobility training;Therapeutic activities;Therapeutic exercise;Balance training;Neuromuscular re-education    PT Goals (Current goals can be found in the Care Plan section)  Acute Rehab PT Goals Patient Stated Goal: didn't state PT Goal Formulation: With patient Time For Goal Achievement: 01/30/24 Potential to Achieve Goals: Good    Frequency Min 2X/week     Co-evaluation               AM-PAC PT "6 Clicks" Mobility  Outcome Measure Help  needed turning from your back to your side while in a flat bed without using bedrails?: A Little Help needed moving from lying on your back to sitting on the side of a flat bed without using bedrails?: A Little Help needed moving to and from a bed to a chair (including a wheelchair)?: A Little Help needed standing up from a chair using your arms (e.g., wheelchair or bedside chair)?: A Little Help needed to walk in hospital room?: A Little Help needed climbing 3-5 steps with a railing? : A Lot 6 Click Score: 17    End of Session Equipment Utilized During Treatment: Gait belt Activity Tolerance: Patient limited by fatigue Patient left: in chair;with call bell/phone within reach Nurse Communication: Mobility status PT Visit Diagnosis: Unsteadiness on feet (R26.81);Muscle weakness (generalized) (M62.81);Difficulty in walking, not elsewhere classified (R26.2)    Time: 1610-9604 PT Time Calculation (min) (ACUTE ONLY): 19 min   Charges:   PT Evaluation $PT Eval Low Complexity: 1 Low   PT General Charges $$ ACUTE PT VISIT: 1 Visit         Lewis Shock, PT, DPT Acute Rehabilitation Services Secure chat preferred Office #: [phone]   Iona Hansen 01/16/2024, 8:40 AM

## 2024-01-16 NOTE — Progress Notes (Signed)
 Patient ID: Leroy Simmons, male   DOB: [birth year], 53 y.o.   MRN: [id number] S: No new complaints O:BP (!) 147/96 (BP Location: Left Arm)   Pulse 94   Temp 97.6 F (36.4 C) (Oral)   Resp 16   Ht 5\' 8"  (1.727 m)   Wt 106.8 kg   SpO2 92%   BMI 35.79 kg/m   Intake/Output Summary (Last 24 hours) at 01/16/2024 1050 Last data filed at 01/16/2024 0554 Gross per 24 hour  Intake 240 ml  Output 3850 ml  Net -3610 ml   Intake/Output: I/O last 3 completed shifts: In: 662 [P.O.:600; IV Piggyback:62] Out: 7075 [Urine:7075]  Intake/Output this shift:  No intake/output data recorded. Weight change: -2.223 kg Gen: NAD CVS: RRR Resp:CTA Abd: +Bs, soft, NT/Nd Ext: 2-3+ pedal edema, 2+ pretibial edema  Recent Labs  Lab 01/10/24 0508 01/11/24 0429 01/12/24 0412 01/13/24 0449 01/14/24 0354 01/15/24 0420 01/16/24 0414  NA 134* 134* 135 135 136 138 138  K 3.5 3.9 3.3* 3.4* 3.5 3.6 3.3*  CL 104 102 105 103 104 104 102  CO2 19* 17* 19* 20* 19* 22 25  GLUCOSE 129* 150* 95 104* 216* 187* 197*  BUN 39* 46* 46* 49* 56* 55* 53*  CREATININE 3.03* 3.79* 4.15* 4.43* 4.04* 3.45* 3.05*  ALBUMIN 1.7* 2.0* 1.8* 2.2* 2.1* 2.5* 2.3*  CALCIUM 7.7* 7.9* 8.5* 8.1* 8.1* 8.6* 8.3*  PHOS  --  3.5 4.2 3.9 4.0 2.9 2.8  AST 39  --   --   --   --   --   --   ALT 28  --   --   --   --   --   --    Liver Function Tests: Recent Labs  Lab 01/10/24 0508 01/11/24 0429 01/14/24 0354 01/15/24 0420 01/16/24 0414  AST 39  --   --   --   --   ALT 28  --   --   --   --   ALKPHOS 92  --   --   --   --   BILITOT 0.9  --   --   --   --   PROT 5.4*  --   --   --   --   ALBUMIN 1.7*   < > 2.1* 2.5* 2.3*   < > = values in this interval not displayed.   No results for input(s): "LIPASE", "AMYLASE" in the last 168 hours. No results for input(s): "AMMONIA" in the last 168 hours. CBC: Recent Labs  Lab 01/12/24 0412 01/13/24 0449 01/14/24 0354 01/15/24 0420 01/16/24 0414  WBC 6.3 5.5 4.6 6.9 7.5  HGB 12.9*  11.3* 11.2* 13.6 13.5  HCT 38.6* 32.6* 33.5* 40.1 39.3  MCV 86.0 85.3 86.6 85.7 85.1  PLT 192 194 217 331 370   Cardiac Enzymes: No results for input(s): "CKTOTAL", "CKMB", "CKMBINDEX", "TROPONINI" in the last 168 hours. CBG: Recent Labs  Lab 01/15/24 0637 01/15/24 1226 01/15/24 1723 01/15/24 2106 01/16/24 0553  GLUCAP 168* 145* 231* 198* 147*    Iron Studies: No results for input(s): "IRON", "TIBC", "TRANSFERRIN", "FERRITIN" in the last 72 hours. Studies/Results: No results found.  amLODipine  10 mg Oral Daily   amoxicillin-clavulanate  1 tablet Oral Q12H   apixaban  10 mg Oral BID   Followed by   Melene Muller ON 01/19/2024] apixaban  5 mg Oral BID   fluticasone  2 spray Each Nare Daily   guaiFENesin  1,200 mg Oral BID  insulin aspart  0-9 Units Subcutaneous TID WC   insulin aspart  4 Units Subcutaneous TID WC   insulin glargine-yfgn  20 Units Subcutaneous BID   loperamide  2 mg Oral Once   loratadine  10 mg Oral Daily   pantoprazole  40 mg Oral BID   sodium bicarbonate  650 mg Oral BID   sodium chloride flush  3 mL Intravenous Q12H   sucralfate  1 g Oral TID WC & HS   thiamine  100 mg Oral Daily    BMET    Component Value Date/Time   NA 138 01/16/2024 0414   NA 139 03/29/2022 1059   K 3.3 (L) 01/16/2024 0414   CL 102 01/16/2024 0414   CO2 25 01/16/2024 0414   GLUCOSE 197 (H) 01/16/2024 0414   BUN 53 (H) 01/16/2024 0414   BUN 29 (H) 03/29/2022 1059   CREATININE 3.05 (H) 01/16/2024 0414   CALCIUM 8.3 (L) 01/16/2024 0414   GFRNONAA 24 (L) 01/16/2024 0414   GFRAA  10/26/2010 1143    >60        The eGFR has been calculated using the MDRD equation. This calculation has not been validated in all clinical situations. eGFR's persistently <60 mL/min signify possible Chronic Kidney Disease.   CBC    Component Value Date/Time   WBC 7.5 01/16/2024 0414   RBC 4.62 01/16/2024 0414   HGB 13.5 01/16/2024 0414   HCT 39.3 01/16/2024 0414   PLT 370 01/16/2024 0414    MCV 85.1 01/16/2024 0414   MCH 29.2 01/16/2024 0414   MCHC 34.4 01/16/2024 0414   RDW 12.4 01/16/2024 0414   LYMPHSABS 0.8 01/09/2024 0534   MONOABS 0.8 01/09/2024 0534   EOSABS 0.0 01/09/2024 0534   BASOSABS 0.0 01/09/2024 0534    Assessment/Plan:   AKI/CKD stage IV - in setting of PE and volume overload.  Underlying CKD likely due to longstanding, poorly controlled DM.  BUN/Cr slowly improving towards his baseline.  Continue with current regimen for now.  Will need to f/u with our office after his discharge.  Acute PE - no evidence of DVT by dopplers.  Currently on Eliquis Acute on chronic HFpEF - responding to IV lasix but still with significant lower extremity edema.  Would benefit from [employer]. Multifocal pneumonia - abx per primary svc DM type 2 - poorly controlled.  HTN/volume - as above.  Responding to lasix. Metabolic acidosis - on sodium bicarb Hypokalemia - cont with supplementation  Benjamin Brands, MD [facility]

## 2024-01-16 NOTE — Evaluation (Signed)
 Occupational Therapy Evaluation Patient Details Name: Leroy Simmons MRN: [id number] DOB: 14-Mar-1971 Today's Date: 01/16/2024   History of Present Illness   Pt is a 53 yr old male who presented 01/09/24 due to acute hypoxic respiratory failure. VQ scan showed positive for large single segmental perfusion defect within the right upper lobe compatible with PE and CT showed favor multifocal pneumonia. PMH: CHF, anxiety, depression, T2DM, HTN,  laparoscopic appendectomy 12/23 due to gangrenous appendicitis, NSTEMI, AKI, DVT, CAD     Clinical Impressions Pt reported at PLOF they work and live other housemates and did not use any DME. Pt at this time noted with increase in time able to complete LE dressing when sitting. Pt with ambulation wanted to use rail in hall as reported feeling unsteady. Pt's BP pre ambulation 139/100, post ambulation 158/102, and  post sitting 163/92 with max HR 102 and 02 in 90%. At this time will follow at acute level but no follow up post dc at this time.      If plan is discharge home, recommend the following:   Assistance with cooking/housework     Functional Status Assessment   Patient has had a recent decline in their functional status and demonstrates the ability to make significant improvements in function in a reasonable and predictable amount of time.     Equipment Recommendations   None recommended by OT (spoke about shower chair but unclear if can use with housemates)     Recommendations for Other Services         Precautions/Restrictions   Precautions Precautions: Fall Precaution/Restrictions Comments: pt with bilat compression stockings due to bilat LE edema Restrictions Weight Bearing Restrictions Per Provider Order: No     Mobility Bed Mobility               General bed mobility comments: Pt presented sitting in chair    Transfers Overall transfer level: Needs assistance Equipment used: None Transfers: Sit to/from  Stand Sit to Stand: Contact guard assist                  Balance Overall balance assessment: Needs assistance Sitting-balance support: Feet supported, No upper extremity supported Sitting balance-Leahy Scale: Good     Standing balance support: Single extremity supported, No upper extremity supported Standing balance-Leahy Scale: Fair Standing balance comment: liked to use the railing in hall, noted swaying when standing                           ADL either performed or assessed with clinical judgement   ADL Overall ADL's : Needs assistance/impaired Eating/Feeding: Independent;Sitting   Grooming: Wash/dry hands;Wash/dry face;Contact guard assist;Standing   Upper Body Bathing: Set up;Sitting   Lower Body Bathing: Supervison/ safety;Sitting/lateral leans   Upper Body Dressing : Set up;Sitting   Lower Body Dressing: Supervision/safety;Sit to/from stand   Toilet Transfer: Supervision/safety;Contact guard assist;Ambulation   Psychiatrist and Hygiene: Contact guard assist;Sit to/from stand;Sitting/lateral lean       Functional mobility during ADLs: Contact guard assist;Supervision/safety General ADL Comments: used railing in hall     Vision Baseline Vision/History: 1 Wears glasses Ability to See in Adequate Light: 0 Adequate Patient Visual Report: No change from baseline Vision Assessment?: No apparent visual deficits     Perception Perception: Within Functional Limits       Praxis Praxis: WFL       Pertinent Vitals/Pain Pain Assessment Pain Assessment: No/denies pain  Extremity/Trunk Assessment Upper Extremity Assessment Upper Extremity Assessment: Generalized weakness;Right hand dominant   Lower Extremity Assessment Lower Extremity Assessment: Defer to PT evaluation   Cervical / Trunk Assessment Cervical / Trunk Assessment: Other exceptions Cervical / Trunk Exceptions: distended abdomen   Communication  Communication Communication: No apparent difficulties   Cognition Arousal: Alert Behavior During Therapy: Flat affect                                 Following commands: Intact       Cueing  General Comments   Cueing Techniques: Verbal cues  BP pre ambulation 139/100 post ambulation 158/102 post sitting 163/92   Exercises     Shoulder Instructions      Home Living Family/patient expects to be discharged to:: Private residence Living Arrangements: Non-relatives/Friends (4-5 with house mates) Available Help at Discharge: Friend(s);Available PRN/intermittently (everyone works) Type of Home: House Home Access: Level entry     Home Layout: Able to live on main level with bedroom/bathroom     Bathroom Shower/Tub: Tub/shower unit;Curtain   Bathroom Toilet: Standard Bathroom Accessibility: Yes   Home Equipment: Agricultural consultant (2 wheels)          Prior Functioning/Environment Prior Level of Function : Independent/Modified Independent;Driving;Working/employed             Mobility Comments: no AD, works in [employer] and goes into work ADLs Comments: indep    OT Problem List: Decreased activity tolerance;Impaired balance (sitting and/or standing);Decreased safety awareness;Decreased knowledge of use of DME or AE;Cardiopulmonary status limiting activity   OT Treatment/Interventions: Self-care/ADL training;DME and/or AE instruction;Therapeutic activities;Patient/family education;Balance training      OT Goals(Current goals can be found in the care plan section)   Acute Rehab OT Goals Patient Stated Goal: to get better OT Goal Formulation: With patient Time For Goal Achievement: 01/30/24 Potential to Achieve Goals: Good   OT Frequency:  Min 1X/week    Co-evaluation              AM-PAC OT "6 Clicks" Daily Activity     Outcome Measure Help from another person eating meals?: None Help from another person taking care of personal  grooming?: A Little Help from another person toileting, which includes using toliet, bedpan, or urinal?: A Little Help from another person bathing (including washing, rinsing, drying)?: A Little Help from another person to put on and taking off regular upper body clothing?: A Little Help from another person to put on and taking off regular lower body clothing?: A Little 6 Click Score: 19   End of Session Equipment Utilized During Treatment: Gait belt Nurse Communication: Mobility status  Activity Tolerance: Patient limited by fatigue Patient left: in chair;with call bell/phone within reach  OT Visit Diagnosis: Unsteadiness on feet (R26.81);Other abnormalities of gait and mobility (R26.89);Repeated falls (R29.6);Muscle weakness (generalized) (M62.81)                Time: 9629-5284 OT Time Calculation (min): 23 min Charges:  OT General Charges $OT Visit: 1 Visit OT Evaluation $OT Eval Low Complexity: 1 Low OT Treatments $Self Care/Home Management : 8-22 mins  Presley Raddle OTR/L  Acute Rehab Services  [phone] office number   Alphia Moh 01/16/2024, 10:23 AM

## 2024-01-17 DIAGNOSIS — E669 Obesity, unspecified: Secondary | ICD-10-CM | POA: Diagnosis not present

## 2024-01-17 DIAGNOSIS — I2699 Other pulmonary embolism without acute cor pulmonale: Secondary | ICD-10-CM | POA: Diagnosis not present

## 2024-01-17 DIAGNOSIS — J9601 Acute respiratory failure with hypoxia: Secondary | ICD-10-CM | POA: Diagnosis not present

## 2024-01-17 DIAGNOSIS — E1165 Type 2 diabetes mellitus with hyperglycemia: Secondary | ICD-10-CM | POA: Diagnosis not present

## 2024-01-17 LAB — RENAL FUNCTION PANEL
Albumin: 2.4 g/dL — ABNORMAL LOW (ref 3.5–5.0)
Anion gap: 10 (ref 5–15)
BUN: 59 mg/dL — ABNORMAL HIGH (ref 6–20)
CO2: 24 mmol/L (ref 22–32)
Calcium: 8.5 mg/dL — ABNORMAL LOW (ref 8.9–10.3)
Chloride: 103 mmol/L (ref 98–111)
Creatinine, Ser: 2.96 mg/dL — ABNORMAL HIGH (ref 0.61–1.24)
GFR, Estimated: 25 mL/min — ABNORMAL LOW (ref >60)
Glucose, Bld: 258 mg/dL — ABNORMAL HIGH (ref 70–99)
Phosphorus: 2.5 mg/dL (ref 2.5–4.6)
Potassium: 3.4 mmol/L — ABNORMAL LOW (ref 3.5–5.1)
Sodium: 137 mmol/L (ref 135–145)

## 2024-01-17 LAB — GLUCOSE, CAPILLARY
Glucose-Capillary: 229 mg/dL — ABNORMAL HIGH (ref 70–99)
Glucose-Capillary: 164 mg/dL — ABNORMAL HIGH (ref 70–99)
Glucose-Capillary: 188 mg/dL — ABNORMAL HIGH (ref 70–99)
Glucose-Capillary: 236 mg/dL — ABNORMAL HIGH (ref 70–99)

## 2024-01-17 LAB — CBC
HCT: 41 % (ref 39.0–52.0)
Hemoglobin: 13.8 g/dL (ref 13.0–17.0)
MCH: 28.9 pg (ref 26.0–34.0)
MCHC: 33.7 g/dL (ref 30.0–36.0)
MCV: 86 fL (ref 80.0–100.0)
Platelets: 462 10*3/uL — ABNORMAL HIGH (ref 150–400)
RBC: 4.77 MIL/uL (ref 4.22–5.81)
RDW: 12.5 % (ref 11.5–15.5)
WBC: 9.4 10*3/uL (ref 4.0–10.5)
nRBC: 0 % (ref 0.0–0.2)

## 2024-01-17 MED ORDER — CARVEDILOL 6.25 MG PO TABS
6.2500 mg | ORAL_TABLET | Freq: Two times a day (BID) | ORAL | Status: DC
  Administered 2024-01-17: 6.25 mg via ORAL
  Filled 2024-01-17: qty 1

## 2024-01-17 MED ORDER — CARVEDILOL 12.5 MG PO TABS
12.5000 mg | ORAL_TABLET | Freq: Two times a day (BID) | ORAL | Status: DC
  Administered 2024-01-17 – 2024-01-19 (×4): 12.5 mg via ORAL
  Filled 2024-01-17 (×4): qty 1

## 2024-01-17 NOTE — Progress Notes (Signed)
 Physical Therapy Treatment Patient Details Name: Leroy Simmons MRN: [id number] DOB: 04/07/1971 Today's Date: 01/17/2024   History of Present Illness Pt is a 53 yr old male who presented 01/09/24 due to acute hypoxic respiratory failure. VQ scan showed positive for large single segmental perfusion defect within the right upper lobe compatible with PE and CT showed favor multifocal pneumonia. PMH: CHF, anxiety, depression, T2DM, HTN,  laparoscopic appendectomy 12/23 due to gangrenous appendicitis, NSTEMI, AKI, DVT, CAD    PT Comments  Pt making steady progress with mobility and activity tolerance. No c/o's of dizziness or dyspnea with amb.    If plan is discharge home, recommend the following: Assist for transportation   Can travel by private vehicle        Equipment Recommendations  None recommended by PT    Recommendations for Other Services       Precautions / Restrictions Precautions Precautions: Fall Restrictions Weight Bearing Restrictions Per Provider Order: No     Mobility  Bed Mobility               General bed mobility comments: Pt up in chair    Transfers Overall transfer level: Modified independent Equipment used: None Transfers: Sit to/from Stand Sit to Stand: Modified independent (Device/Increase time)                Ambulation/Gait Ambulation/Gait assistance: Supervision Gait Distance (Feet): 225 Feet Assistive device: None (occasional used wall rail) Gait Pattern/deviations: Step-through pattern, Decreased stride length, Wide base of support Gait velocity: dec Gait velocity interpretation: 1.31 - 2.62 ft/sec, indicative of limited community ambulator   General Gait Details: No c/o's of dizziness. No dyspnea.   Stairs             Wheelchair Mobility     Tilt Bed    Modified Rankin (Stroke Patients Only)       Balance Overall balance assessment: Needs assistance Sitting-balance support: Feet supported, No upper extremity  supported Sitting balance-Leahy Scale: Good     Standing balance support: During functional activity, No upper extremity supported Standing balance-Leahy Scale: Good                              Communication Communication Communication: No apparent difficulties  Cognition Arousal: Alert Behavior During Therapy: Flat affect   PT - Cognitive impairments: No apparent impairments                         Following commands: Intact      Cueing Cueing Techniques: Verbal cues  Exercises      General Comments General comments (skin integrity, edema, etc.): VSS on RA      Pertinent Vitals/Pain Pain Assessment Pain Assessment: No/denies pain    Home Living                          Prior Function            PT Goals (current goals can now be found in the care plan section) Acute Rehab PT Goals Patient Stated Goal: didn't state Progress towards PT goals: Progressing toward goals    Frequency    Min 2X/week      PT Plan      Co-evaluation              AM-PAC PT "6 Clicks" Mobility   Outcome Measure  Help needed  turning from your back to your side while in a flat bed without using bedrails?: A Little Help needed moving from lying on your back to sitting on the side of a flat bed without using bedrails?: A Little Help needed moving to and from a bed to a chair (including a wheelchair)?: A Little Help needed standing up from a chair using your arms (e.g., wheelchair or bedside chair)?: None Help needed to walk in hospital room?: A Little Help needed climbing 3-5 steps with a railing? : A Little 6 Click Score: 19    End of Session   Activity Tolerance: Patient tolerated treatment well Patient left: in chair;with call bell/phone within reach   PT Visit Diagnosis: Unsteadiness on feet (R26.81);Muscle weakness (generalized) (M62.81);Difficulty in walking, not elsewhere classified (R26.2)     Time: 4742-5956 PT Time  Calculation (min) (ACUTE ONLY): 15 min  Charges:    $Gait Training: 8-22 mins PT General Charges $$ ACUTE PT VISIT: 1 Visit                     [facility] PT Acute Rehabilitation Services Office [phone]    Pura Browns [facility] 01/17/2024, 4:50 PM

## 2024-01-17 NOTE — Progress Notes (Signed)
 Patient ID: DECODA VAN, male   DOB: 1971-09-06, 53 y.o.   MRN: [id number] S: No new complaints this morning O:BP (!) 179/118 (BP Location: Left Arm)   Pulse 99   Temp 97.6 F (36.4 C) (Oral)   Resp 17   Ht 5\' 8"  (1.727 m)   Wt 107 kg   SpO2 95%   BMI 35.87 kg/m   Intake/Output Summary (Last 24 hours) at 01/17/2024 1043 Last data filed at 01/17/2024 0620 Gross per 24 hour  Intake 240 ml  Output 2775 ml  Net -2535 ml   Intake/Output: I/O last 3 completed shifts: In: 480 [P.O.:480] Out: 5075 [Urine:5075]  Intake/Output this shift:  No intake/output data recorded. Weight change: 0.223 kg Gen: NAD CVS: RRR Resp:CTA Abd: +BS, soft, NT/ND Ext: 2+ edema  Recent Labs  Lab 01/11/24 0429 01/12/24 0412 01/13/24 0449 01/14/24 0354 01/15/24 0420 01/16/24 0414 01/17/24 0351  NA 134* 135 135 136 138 138 137  K 3.9 3.3* 3.4* 3.5 3.6 3.3* 3.4*  CL 102 105 103 104 104 102 103  CO2 17* 19* 20* 19* 22 25 24   GLUCOSE 150* 95 104* 216* 187* 197* 258*  BUN 46* 46* 49* 56* 55* 53* 59*  CREATININE 3.79* 4.15* 4.43* 4.04* 3.45* 3.05* 2.96*  ALBUMIN 2.0* 1.8* 2.2* 2.1* 2.5* 2.3* 2.4*  CALCIUM 7.9* 8.5* 8.1* 8.1* 8.6* 8.3* 8.5*  PHOS 3.5 4.2 3.9 4.0 2.9 2.8 2.5   Liver Function Tests: Recent Labs  Lab 01/15/24 0420 01/16/24 0414 01/17/24 0351  ALBUMIN 2.5* 2.3* 2.4*   No results for input(s): "LIPASE", "AMYLASE" in the last 168 hours. No results for input(s): "AMMONIA" in the last 168 hours. CBC: Recent Labs  Lab 01/13/24 0449 01/14/24 0354 01/15/24 0420 01/16/24 0414 01/17/24 0351  WBC 5.5 4.6 6.9 7.5 9.4  HGB 11.3* 11.2* 13.6 13.5 13.8  HCT 32.6* 33.5* 40.1 39.3 41.0  MCV 85.3 86.6 85.7 85.1 86.0  PLT 194 217 331 370 462*   Cardiac Enzymes: No results for input(s): "CKTOTAL", "CKMB", "CKMBINDEX", "TROPONINI" in the last 168 hours. CBG: Recent Labs  Lab 01/16/24 0553 01/16/24 1140 01/16/24 1617 01/16/24 2112 01/17/24 0545  GLUCAP 147* 205* 302* 284* 229*     Iron Studies: No results for input(s): "IRON", "TIBC", "TRANSFERRIN", "FERRITIN" in the last 72 hours. Studies/Results: No results found.  amoxicillin-clavulanate  1 tablet Oral Q12H   apixaban  10 mg Oral BID   Followed by   Melene Muller ON 01/19/2024] apixaban  5 mg Oral BID   carvedilol  6.25 mg Oral BID WC   fluticasone  2 spray Each Nare Daily   guaiFENesin  1,200 mg Oral BID   insulin aspart  0-9 Units Subcutaneous TID WC   insulin aspart  6 Units Subcutaneous TID WC   insulin glargine-yfgn  25 Units Subcutaneous BID   loperamide  2 mg Oral Once   loratadine  10 mg Oral Daily   pantoprazole  40 mg Oral BID   sodium bicarbonate  650 mg Oral BID   sodium chloride flush  3 mL Intravenous Q12H   sucralfate  1 g Oral TID WC & HS   thiamine  100 mg Oral Daily    BMET    Component Value Date/Time   NA 137 01/17/2024 0351   NA 139 03/29/2022 1059   K 3.4 (L) 01/17/2024 0351   CL 103 01/17/2024 0351   CO2 24 01/17/2024 0351   GLUCOSE 258 (H) 01/17/2024 0351   BUN 59 (  H) 01/17/2024 0351   BUN 29 (H) 03/29/2022 1059   CREATININE 2.96 (H) 01/17/2024 0351   CALCIUM 8.5 (L) 01/17/2024 0351   GFRNONAA 25 (L) 01/17/2024 0351   GFRAA  10/26/2010 1143    >60        The eGFR has been calculated using the MDRD equation. This calculation has not been validated in all clinical situations. eGFR's persistently <60 mL/min signify possible Chronic Kidney Disease.   CBC    Component Value Date/Time   WBC 9.4 01/17/2024 0351   RBC 4.77 01/17/2024 0351   HGB 13.8 01/17/2024 0351   HCT 41.0 01/17/2024 0351   PLT 462 (H) 01/17/2024 0351   MCV 86.0 01/17/2024 0351   MCH 28.9 01/17/2024 0351   MCHC 33.7 01/17/2024 0351   RDW 12.5 01/17/2024 0351   LYMPHSABS 0.8 01/09/2024 0534   MONOABS 0.8 01/09/2024 0534   EOSABS 0.0 01/09/2024 0534   BASOSABS 0.0 01/09/2024 0534    Assessment/Plan:   AKI/CKD stage IV - in setting of PE and volume overload.  Underlying CKD likely due to  longstanding, poorly controlled DM.  BUN/Cr slowly improving towards his baseline.  Continue with current regimen for now.  Will need to f/u with our office after his discharge.  Acute PE - no evidence of DVT by dopplers.  Currently on Eliquis Acute on chronic HFpEF - responding to IV lasix but still with significant lower extremity edema.  Ordered Unna boots yesterday, awaiting consult. Multifocal pneumonia - abx per primary svc DM type 2 - poorly controlled.  HTN/volume - as above.  Responding to lasix.  Will increase carvedilol to 12.5 mg and follow bp Metabolic acidosis - on sodium bicarb Hypokalemia - cont with supplementation  Benjamin Brands, MD [facility] [phone]

## 2024-01-17 NOTE — Progress Notes (Signed)
 Mobility Specialist Progress Note:   01/17/24 0956  Mobility  Activity Ambulated with assistance in hallway;Ambulated with assistance in room  Level of Assistance Modified independent, requires aide device or extra time  Assistive Device Other (Comment) (hand rails)  Distance Ambulated (ft) 160 ft  Activity Response Tolerated well  Mobility Referral Yes  Mobility visit 1 Mobility  Mobility Specialist Start Time (ACUTE ONLY) 0945  Mobility Specialist Stop Time (ACUTE ONLY) 0956  Mobility Specialist Time Calculation (min) (ACUTE ONLY) 11 min   Pt received dangling EOB, agreeable to mobility session. Ambulated in hallway, ModI with hand rails for support. Tolerated well, c/o "SOB" and feeling "swimmy." SpO2 95% on RA and BP 167/102 (120) post mobility. Pt returned to room, left with all needs met.    Leroy Simmons Mobility Specialist Please contact via Special educational needs teacher or  Rehab office at [phone]

## 2024-01-17 NOTE — Progress Notes (Signed)
 PROGRESS NOTE  Leroy Simmons WUJ:[id number] DOB: 10/27/1970   PCP: Grayce Sessions, NP  Patient is from: Home.  DOA: 01/09/2024 LOS: 7  Chief complaints Chief Complaint  Patient presents with   Headache   Chills     Brief Narrative / Interim history: 53 year old gentleman history of hypertension, CHF, diabetes type 2, CKD stage IIIb, anxiety/depression, history of DVT not on anticoagulation recurrent falls presenting with headaches, chills, cough, congestion, nausea, vomiting, diarrhea. Patient seen in the ED noted to be in acute hypoxic respiratory failure requiring O2, VQ scan done concerning for PE, CT chest done concerning for multifocal pneumonia versus volume overload. Patient placed on IV heparin and empiric IV antibiotics. Due to patient's worsening renal function and ongoing volume overload, nephrology consulted.   Subjective: Seen and examined earlier this morning.  No major events overnight of this morning.  No complaints other than leg swelling and some dry cough.  Objective: Vitals:   01/16/24 2331 01/17/24 0400 01/17/24 0743 01/17/24 1244  BP: (!) 158/100 (!) 178/112 (!) 179/118 (!) 155/106  Pulse: 99 100 99 96  Resp: 20 18 17 20   Temp: 97.9 F (36.6 C) 97.9 F (36.6 C) 97.6 F (36.4 C) (!) 97.5 F (36.4 C)  TempSrc: Oral Oral Oral Oral  SpO2: 95% 96% 95% 95%  Weight:  107 kg    Height:        Examination:  GENERAL: No apparent distress.  Nontoxic. HEENT: MMM.  Vision and hearing grossly intact.  NECK: Supple.  No apparent JVD.  RESP:  No IWOB.  Diminished aeration bilaterally.  Bibasilar crackles. CVS:  RRR. Heart sounds normal.  ABD/GI/GU: BS+. Abd soft, NTND.  MSK/EXT:  Moves extremities. No apparent deformity.  2+ BLE edema. SKIN: no apparent skin lesion or wound NEURO: Awake, alert and oriented appropriately.  No apparent focal neuro deficit. PSYCH: Calm. Normal affect.   Consultants:  Nephrology  Procedures: None  Microbiology  summarized: 4/8-COVID-19, influenza and RSV PCR nonreactive 4/8-C. difficile and GIP negative 4/8-blood cultures NGTD 4/10-MRSA PCR screen negative   Assessment and plan: Acute hypoxic respiratory failure with hypoxia-multifactorial including PE, multifocal pneumonia and CHF.  Noted to be hypoxic to 86% on RA on presentation.  Respiratory failure resolved. -Treat treatable causes/individual problems as below -IS  Acute PE: VQ scan positive for large single segmental perfusion defect within the RUL.  LE venous Doppler negative for DVT.  TTE with LVEF of 60 to 65%, WMA, normal RV SF but moderately elevated PASP -Continue Eliquis  Multifocal pneumonia: CT chest without contrast showed nodularity throughout both lungs with basilar predominance suggesting multifocal pneumonia. -Antibiotics escalated to p.o. Augmentin.  Acute on chronic diastolic CHF: TTE as above.  Still with significant BLE edema and bibasilar crackles.  On IV Lasix per nephrology.  About 2.8 L UOP/24 hours.  Net -80 L.  Creatinine improving. -Continue IV Lasix per nephrology -Agree with Roland Rack boot -Encourage leg elevation -Strict intake and output, daily weight, renal functions and electrolytes -Discontinue amlodipine  AKI on CKD-3B: Baseline Cr appears to be about 2.3.  Improving. Recent Labs    09/17/23 0915 01/09/24 0534 01/10/24 0508 01/11/24 0429 01/12/24 0412 01/13/24 0449 01/14/24 0354 01/15/24 0420 01/16/24 0414 01/17/24 0351  BUN 43* 35* 39* 46* 46* 49* 56* 55* 53* 59*  CREATININE 2.28* 2.62* 3.03* 3.79* 4.15* 4.43* 4.04* 3.45* 3.05* 2.96*  - Nephrology managing - Avoid nephrotoxic meds  Poorly controlled DM-2 with hyperglycemia: A1c 9.3% (was 10.8% on 12/7). Recent  Labs  Lab 01/16/24 1140 01/16/24 1617 01/16/24 2112 01/17/24 0545 01/17/24 1241  GLUCAP 205* 302* 284* 229* 164*  - Continue Semglee 25 units daily - NovoLog 6 units 3 times daily with meals - SSI-sensitive   Diarrhea: Felt to  be due to viral gastroenteritis.  C. difficile and GI panel negative. - Imodium as needed  Uncontrolled hypertension: BP elevated. -Discontinue amlodipine -Start Coreg 6.25 mg twice daily and titrate as tolerated - IV Lasix per nephrology  Hypokalemia - Monitor replenish as appropriate  Class II obesity Body mass index is 35.87 kg/m.           DVT prophylaxis:   apixaban (ELIQUIS) tablet 10 mg  apixaban (ELIQUIS) tablet 5 mg  Code Status: Full code Family Communication: None at bedside Level of care: Telemetry Cardiac Status is: Inpatient Remains inpatient appropriate because: Acute CHF/AKI   Final disposition: Likely home once medically stable   55 minutes with more than 50% spent in reviewing records, counseling patient/family and coordinating care.   Sch Meds:  Scheduled Meds:  amoxicillin-clavulanate  1 tablet Oral Q12H   apixaban  10 mg Oral BID   Followed by   Melene Muller ON 01/19/2024] apixaban  5 mg Oral BID   carvedilol  12.5 mg Oral BID WC   fluticasone  2 spray Each Nare Daily   guaiFENesin  1,200 mg Oral BID   insulin aspart  0-9 Units Subcutaneous TID WC   insulin aspart  6 Units Subcutaneous TID WC   insulin glargine-yfgn  25 Units Subcutaneous BID   loperamide  2 mg Oral Once   loratadine  10 mg Oral Daily   pantoprazole  40 mg Oral BID   sodium bicarbonate  650 mg Oral BID   sodium chloride flush  3 mL Intravenous Q12H   sucralfate  1 g Oral TID WC & HS   thiamine  100 mg Oral Daily   Continuous Infusions:  furosemide 120 mg (01/17/24 0628)   PRN Meds:.acetaminophen, albuterol, guaiFENesin, loperamide, ondansetron (ZOFRAN) IV, sodium chloride flush  Antimicrobials: Anti-infectives (From admission, onward)    Start     Dose/Rate Route Frequency Ordered Stop   01/16/24 1000  amoxicillin-clavulanate (AUGMENTIN) 875-125 MG per tablet 1 tablet        1 tablet Oral Every 12 hours 01/15/24 0945 01/18/24 0959   01/11/24 2200  vancomycin  (VANCOREADY) IVPB 1750 mg/350 mL  Status:  Discontinued        1,750 mg 175 mL/hr over 120 Minutes Intravenous Every 48 hours 01/09/24 2351 01/11/24 1217   01/11/24 1300  cefTRIAXone (ROCEPHIN) 2 g in sodium chloride 0.9 % 100 mL IVPB  Status:  Discontinued        2 g 200 mL/hr over 30 Minutes Intravenous Daily 01/11/24 1217 01/15/24 0945   01/11/24 1300  azithromycin (ZITHROMAX) 500 mg in sodium chloride 0.9 % 250 mL IVPB  Status:  Discontinued        500 mg 250 mL/hr over 60 Minutes Intravenous Daily 01/11/24 1217 01/12/24 1929   01/10/24 0000  piperacillin-tazobactam (ZOSYN) IVPB 3.375 g  Status:  Discontinued        3.375 g 12.5 mL/hr over 240 Minutes Intravenous Every 8 hours 01/09/24 2347 01/11/24 1217   01/10/24 0000  vancomycin (VANCOREADY) IVPB 2000 mg/400 mL        2,000 mg 200 mL/hr over 120 Minutes Intravenous  Once 01/09/24 2348 01/10/24 0239   01/09/24 1200  cefTRIAXone (ROCEPHIN) 1 g in sodium chloride  0.9 % 100 mL IVPB        1 g 200 mL/hr over 30 Minutes Intravenous  Once 01/09/24 1154 01/09/24 1233   01/09/24 1200  azithromycin (ZITHROMAX) 500 mg in sodium chloride 0.9 % 250 mL IVPB        500 mg 250 mL/hr over 60 Minutes Intravenous  Once 01/09/24 1154 01/09/24 1504        I have personally reviewed the following labs and images: CBC: Recent Labs  Lab 01/13/24 0449 01/14/24 0354 01/15/24 0420 01/16/24 0414 01/17/24 0351  WBC 5.5 4.6 6.9 7.5 9.4  HGB 11.3* 11.2* 13.6 13.5 13.8  HCT 32.6* 33.5* 40.1 39.3 41.0  MCV 85.3 86.6 85.7 85.1 86.0  PLT 194 217 331 370 462*   BMP &GFR Recent Labs  Lab 01/13/24 0449 01/14/24 0354 01/15/24 0420 01/16/24 0414 01/17/24 0351  NA 135 136 138 138 137  K 3.4* 3.5 3.6 3.3* 3.4*  CL 103 104 104 102 103  CO2 20* 19* 22 25 24   GLUCOSE 104* 216* 187* 197* 258*  BUN 49* 56* 55* 53* 59*  CREATININE 4.43* 4.04* 3.45* 3.05* 2.96*  CALCIUM 8.1* 8.1* 8.6* 8.3* 8.5*  PHOS 3.9 4.0 2.9 2.8 2.5   Estimated Creatinine  Clearance: 35.4 mL/min (A) (by C-G formula based on SCr of 2.96 mg/dL (H)). Liver & Pancreas: Recent Labs  Lab 01/13/24 0449 01/14/24 0354 01/15/24 0420 01/16/24 0414 01/17/24 0351  ALBUMIN 2.2* 2.1* 2.5* 2.3* 2.4*   No results for input(s): "LIPASE", "AMYLASE" in the last 168 hours. No results for input(s): "AMMONIA" in the last 168 hours. Diabetic: No results for input(s): "HGBA1C" in the last 72 hours. Recent Labs  Lab 01/16/24 1140 01/16/24 1617 01/16/24 2112 01/17/24 0545 01/17/24 1241  GLUCAP 205* 302* 284* 229* 164*   Cardiac Enzymes: No results for input(s): "CKTOTAL", "CKMB", "CKMBINDEX", "TROPONINI" in the last 168 hours. No results for input(s): "PROBNP" in the last 8760 hours. Coagulation Profile: No results for input(s): "INR", "PROTIME" in the last 168 hours. Thyroid Function Tests: No results for input(s): "TSH", "T4TOTAL", "FREET4", "T3FREE", "THYROIDAB" in the last 72 hours. Lipid Profile: No results for input(s): "CHOL", "HDL", "LDLCALC", "TRIG", "CHOLHDL", "LDLDIRECT" in the last 72 hours. Anemia Panel: No results for input(s): "VITAMINB12", "FOLATE", "FERRITIN", "TIBC", "IRON", "RETICCTPCT" in the last 72 hours. Urine analysis:    Component Value Date/Time   COLORURINE YELLOW 01/11/2024 1035   APPEARANCEUR CLOUDY (A) 01/11/2024 1035   LABSPEC 1.015 01/11/2024 1035   PHURINE 5.0 01/11/2024 1035   GLUCOSEU 150 (A) 01/11/2024 1035   HGBUR MODERATE (A) 01/11/2024 1035   BILIRUBINUR NEGATIVE 01/11/2024 1035   KETONESUR NEGATIVE 01/11/2024 1035   PROTEINUR >=300 (A) 01/11/2024 1035   UROBILINOGEN 0.2 08/07/2010 0020   NITRITE NEGATIVE 01/11/2024 1035   LEUKOCYTESUR NEGATIVE 01/11/2024 1035   Sepsis Labs: Invalid input(s): "PROCALCITONIN", "LACTICIDVEN"  Microbiology: Recent Results (from the past 240 hours)  Resp panel by RT-PCR (RSV, Flu A&B, Covid) Anterior Nasal Swab     Status: None   Collection Time: 01/09/24  5:23 AM   Specimen: Anterior  Nasal Swab  Result Value Ref Range Status   SARS Coronavirus 2 by RT PCR NEGATIVE NEGATIVE Final   Influenza A by PCR NEGATIVE NEGATIVE Final   Influenza B by PCR NEGATIVE NEGATIVE Final    Comment: (NOTE) The Xpert Xpress SARS-CoV-2/FLU/RSV plus assay is intended as an aid in the diagnosis of influenza from Nasopharyngeal swab specimens and should not be used as  a sole basis for treatment. Nasal washings and aspirates are unacceptable for Xpert Xpress SARS-CoV-2/FLU/RSV testing.  Fact Sheet for Patients: BloggerCourse.com  Fact Sheet for Healthcare Providers: SeriousBroker.it  This test is not yet approved or cleared by the [place] FDA and has been authorized for detection and/or diagnosis of SARS-CoV-2 by FDA under an Emergency Use Authorization (EUA). This EUA will remain in effect (meaning this test can be used) for the duration of the COVID-19 declaration under Section 564(b)(1) of the Act, 21 U.S.C. section 360bbb-3(b)(1), unless the authorization is terminated or revoked.     Resp Syncytial Virus by PCR NEGATIVE NEGATIVE Final    Comment: (NOTE) Fact Sheet for Patients: BloggerCourse.com  Fact Sheet for Healthcare Providers: SeriousBroker.it  This test is not yet approved or cleared by the [place] FDA and has been authorized for detection and/or diagnosis of SARS-CoV-2 by FDA under an Emergency Use Authorization (EUA). This EUA will remain in effect (meaning this test can be used) for the duration of the COVID-19 declaration under Section 564(b)(1) of the Act, 21 U.S.C. section 360bbb-3(b)(1), unless the authorization is terminated or revoked.  Performed at [facility], [address] [place], [place] [address]   Culture, blood (routine x 2)     Status: None   Collection Time: 01/09/24  8:25 AM   Specimen: BLOOD LEFT FOREARM  Result Value Ref  Range Status   Specimen Description BLOOD LEFT FOREARM  Final   Special Requests   Final    BOTTLES DRAWN AEROBIC AND ANAEROBIC Blood Culture results may not be optimal due to an inadequate volume of blood received in culture bottles   Culture   Final    NO GROWTH 5 DAYS Performed at [facility], [address], [place], [place] [address]    Report Status 01/14/2024 FINAL  Final  Culture, blood (routine x 2)     Status: None   Collection Time: 01/09/24  9:43 AM   Specimen: BLOOD LEFT HAND  Result Value Ref Range Status   Specimen Description BLOOD LEFT HAND  Final   Special Requests   Final    BOTTLES DRAWN AEROBIC AND ANAEROBIC Blood Culture results may not be optimal due to an inadequate volume of blood received in culture bottles   Culture   Final    NO GROWTH 5 DAYS Performed at [facility], [address] [place], [place] [address]    Report Status 01/14/2024 FINAL  Final  Gastrointestinal Panel by PCR , Stool     Status: None   Collection Time: 01/09/24  5:03 PM   Specimen: Stool  Result Value Ref Range Status   Campylobacter species NOT DETECTED NOT DETECTED Final   Plesimonas shigelloides NOT DETECTED NOT DETECTED Final   Salmonella species NOT DETECTED NOT DETECTED Final   Yersinia enterocolitica NOT DETECTED NOT DETECTED Final   Vibrio species NOT DETECTED NOT DETECTED Final   Vibrio cholerae NOT DETECTED NOT DETECTED Final   Enteroaggregative E coli (EAEC) NOT DETECTED NOT DETECTED Final   Enteropathogenic E coli (EPEC) NOT DETECTED NOT DETECTED Final   Enterotoxigenic E coli (ETEC) NOT DETECTED NOT DETECTED Final   Shiga like toxin producing E coli (STEC) NOT DETECTED NOT DETECTED Final   Shigella/Enteroinvasive E coli (EIEC) NOT DETECTED NOT DETECTED Final   Cryptosporidium NOT DETECTED NOT DETECTED Final   Cyclospora cayetanensis NOT DETECTED NOT DETECTED Final   Entamoeba histolytica NOT DETECTED NOT DETECTED Final   Giardia  lamblia NOT DETECTED  NOT DETECTED Final   Adenovirus F40/41 NOT DETECTED NOT DETECTED Final   Astrovirus NOT DETECTED NOT DETECTED Final   Norovirus GI/GII NOT DETECTED NOT DETECTED Final   Rotavirus A NOT DETECTED NOT DETECTED Final   Sapovirus (I, II, IV, and V) NOT DETECTED NOT DETECTED Final    Comment: Performed at [facility], [address] [place], [place] [address]  C Difficile Quick Screen w PCR reflex     Status: None   Collection Time: 01/09/24  5:03 PM   Specimen: Stool  Result Value Ref Range Status   C Diff antigen NEGATIVE NEGATIVE Final   C Diff toxin NEGATIVE NEGATIVE Final   C Diff interpretation No C. difficile detected.  Final    Comment: Performed at [facility], [address], [place], [place] [address]  MRSA Next Gen by PCR, Nasal     Status: None   Collection Time: 01/11/24  8:47 AM   Specimen: Nasal Mucosa; Nasal Swab  Result Value Ref Range Status   MRSA by PCR Next Gen NOT DETECTED NOT DETECTED Final    Comment: (NOTE) The GeneXpert MRSA Assay (FDA approved for NASAL specimens only), is one component of a comprehensive MRSA colonization surveillance program. It is not intended to diagnose MRSA infection nor to guide or monitor treatment for MRSA infections. Test performance is not FDA approved in patients less than 91 years old. Performed at [facility], [address], [place], [place] [address]     Radiology Studies: No results found.    Meir Elwood T. Dula Havlik [facility]  If 7PM-7AM, please contact night-coverage [email or web address] 01/17/2024, 2:20 PM

## 2024-01-18 ENCOUNTER — Telehealth (HOSPITAL_COMMUNITY): Payer: Self-pay | Admitting: Pharmacy Technician

## 2024-01-18 ENCOUNTER — Other Ambulatory Visit (HOSPITAL_COMMUNITY): Payer: Self-pay

## 2024-01-18 DIAGNOSIS — E669 Obesity, unspecified: Secondary | ICD-10-CM | POA: Diagnosis not present

## 2024-01-18 DIAGNOSIS — E1165 Type 2 diabetes mellitus with hyperglycemia: Secondary | ICD-10-CM | POA: Diagnosis not present

## 2024-01-18 DIAGNOSIS — J9601 Acute respiratory failure with hypoxia: Secondary | ICD-10-CM | POA: Diagnosis not present

## 2024-01-18 DIAGNOSIS — I2699 Other pulmonary embolism without acute cor pulmonale: Secondary | ICD-10-CM | POA: Diagnosis not present

## 2024-01-18 LAB — CBC
HCT: 40.4 % (ref 39.0–52.0)
Hemoglobin: 13.7 g/dL (ref 13.0–17.0)
MCH: 28.8 pg (ref 26.0–34.0)
MCHC: 33.9 g/dL (ref 30.0–36.0)
MCV: 85.1 fL (ref 80.0–100.0)
Platelets: 445 10*3/uL — ABNORMAL HIGH (ref 150–400)
RBC: 4.75 MIL/uL (ref 4.22–5.81)
RDW: 12.5 % (ref 11.5–15.5)
WBC: 10.2 10*3/uL (ref 4.0–10.5)
nRBC: 0 % (ref 0.0–0.2)

## 2024-01-18 LAB — MAGNESIUM: Magnesium: 1.7 mg/dL (ref 1.7–2.4)

## 2024-01-18 LAB — GLUCOSE, CAPILLARY
Glucose-Capillary: 172 mg/dL — ABNORMAL HIGH (ref 70–99)
Glucose-Capillary: 223 mg/dL — ABNORMAL HIGH (ref 70–99)
Glucose-Capillary: 259 mg/dL — ABNORMAL HIGH (ref 70–99)
Glucose-Capillary: 304 mg/dL — ABNORMAL HIGH (ref 70–99)

## 2024-01-18 LAB — RENAL FUNCTION PANEL
Albumin: 2.4 g/dL — ABNORMAL LOW (ref 3.5–5.0)
Anion gap: 14 (ref 5–15)
BUN: 59 mg/dL — ABNORMAL HIGH (ref 6–20)
CO2: 24 mmol/L (ref 22–32)
Calcium: 8.6 mg/dL — ABNORMAL LOW (ref 8.9–10.3)
Chloride: 100 mmol/L (ref 98–111)
Creatinine, Ser: 3.05 mg/dL — ABNORMAL HIGH (ref 0.61–1.24)
GFR, Estimated: 24 mL/min — ABNORMAL LOW (ref >60)
Glucose, Bld: 198 mg/dL — ABNORMAL HIGH (ref 70–99)
Phosphorus: 3.9 mg/dL (ref 2.5–4.6)
Potassium: 3.5 mmol/L (ref 3.5–5.1)
Sodium: 138 mmol/L (ref 135–145)

## 2024-01-18 MED ORDER — INSULIN GLARGINE-YFGN 100 UNIT/ML ~~LOC~~ SOLN
30.0000 [IU] | Freq: Two times a day (BID) | SUBCUTANEOUS | Status: DC
  Administered 2024-01-18 – 2024-01-19 (×2): 30 [IU] via SUBCUTANEOUS
  Filled 2024-01-18 (×3): qty 0.3

## 2024-01-18 MED ORDER — HYDRALAZINE HCL 25 MG PO TABS: 25.0000 mg | ORAL_TABLET | Freq: Four times a day (QID) | ORAL | Status: DC | PRN

## 2024-01-18 NOTE — Progress Notes (Signed)
 PROGRESS NOTE  Leroy Simmons:[id number] DOB: 04/07/1971   PCP: Grayce Sessions, NP  Patient is from: Home.  DOA: 01/09/2024 LOS: 8  Chief complaints Chief Complaint  Patient presents with   Headache   Chills     Brief Narrative / Interim history: 53 year old gentleman history of hypertension, CHF, diabetes type 2, CKD stage IIIb, anxiety/depression, history of DVT not on anticoagulation recurrent falls presenting with headaches, chills, cough, congestion, nausea, vomiting, diarrhea. Patient seen in the ED noted to be in acute hypoxic respiratory failure requiring O2, VQ scan done concerning for PE, CT chest done concerning for multifocal pneumonia versus volume overload. Patient placed on IV heparin and empiric IV antibiotics. Due to patient's worsening renal function and ongoing volume overload, nephrology consulted and following..   Subjective: Seen and examined earlier this morning.  No major events overnight of this morning.  No new complaints other than some dry cough with little phlegm.  Also had some cold sore on his lip.  No other complaints.  Objective: Vitals:   01/18/24 0020 01/18/24 0339 01/18/24 0850 01/18/24 1238  BP:  117/81 (!) 155/99 (!) 158/96  Pulse:  89 94 94  Resp: 18 18 15 16   Temp:  97.7 F (36.5 C) 97.6 F (36.4 C) 98.4 F (36.9 C)  TempSrc:  Oral Oral Oral  SpO2:  95% 95% 94%  Weight: 106.3 kg     Height:        Examination:  GENERAL: No apparent distress.  Nontoxic. HEENT: MMM.  Vision and hearing grossly intact.  NECK: Supple.  No apparent JVD.  RESP:  No IWOB.  Diminished aeration bilaterally.  Bibasilar crackles. CVS:  RRR. Heart sounds normal.  ABD/GI/GU: BS+. Abd soft, NTND.  MSK/EXT:  Moves extremities. No apparent deformity.  2+ BLE edema. SKIN: no apparent skin lesion or wound NEURO: Awake, alert and oriented appropriately.  No apparent focal neuro deficit. PSYCH: Calm. Normal affect.   Consultants:   Nephrology  Procedures: None  Microbiology summarized: 4/8-COVID-19, influenza and RSV PCR nonreactive 4/8-C. difficile and GIP negative 4/8-blood cultures NGTD 4/10-MRSA PCR screen negative   Assessment and plan: Acute hypoxic respiratory failure with hypoxia-multifactorial including PE, multifocal pneumonia and CHF.  Noted to be hypoxic to 86% on RA on presentation.  Respiratory failure resolved. -Treat treatable causes/individual problems as below -IS  Acute PE: VQ scan positive for large single segmental perfusion defect within the RUL.  LE venous Doppler negative for DVT.  TTE with LVEF of 60 to 65%, WMA, normal RV SF but moderately elevated PASP.  He was on IV heparin and transitioned to p.o. Eliquis. -Continue Eliquis for now but high co-pay due to high deductible -Will discuss with pharmacy  Multifocal pneumonia: CT chest without contrast showed nodularity throughout both lungs with basilar predominance suggesting multifocal pneumonia. -Antibiotics descalated to p.o. Augmentin.  Acute on chronic diastolic CHF: TTE as above.  Still with significant BLE edema and bibasilar crackles.  On IV Lasix per nephrology.  About 2.8 L UOP/24 hours.  Net -80 L.  Creatinine improving. -Continue IV Lasix per nephrology -Agree with Roland Rack boot -Encourage leg elevation -Strict intake and output, daily weight, renal functions and electrolytes -Discontinue amlodipine  AKI on CKD-3B: Baseline Cr appears to be about 2.3.  Seems to be leveling off at about 3.0 Recent Labs    01/09/24 0534 01/10/24 0508 01/11/24 0429 01/12/24 0412 01/13/24 0449 01/14/24 0354 01/15/24 0420 01/16/24 0414 01/17/24 0351 01/18/24 0351  BUN 35* 39*  46* 46* 49* 56* 55* 53* 59* 59*  CREATININE 2.62* 3.03* 3.79* 4.15* 4.43* 4.04* 3.45* 3.05* 2.96* 3.05*  - Nephrology managing - Avoid nephrotoxic meds  Poorly controlled DM-2 with hyperglycemia: A1c 9.3% (was 10.8% on 12/7). Recent Labs  Lab 01/17/24 1241  01/17/24 1614 01/17/24 2235 01/18/24 0615 01/18/24 1234  GLUCAP 164* 188* 236* 172* 223*  - Increase Semglee from 25 units twice daily to 30 units twice daily - NovoLog 6 units 3 times daily with meals - Continue SSI-sensitive   Diarrhea: Felt to be due to viral gastroenteritis.  C. difficile and GI panel negative. - Imodium as needed  Uncontrolled hypertension: BP elevated. -Discontinued amlodipine and started Coreg on 4/16 -IV Lasix per nephrology  Hypokalemia -Monitor replenish as appropriate  Possible herpes labialis -Supportive care.  Doubt utility of Valtrex at this point  Class II obesity Body mass index is 35.64 kg/m.           DVT prophylaxis:   apixaban (ELIQUIS) tablet 10 mg  apixaban (ELIQUIS) tablet 5 mg  Code Status: Full code Family Communication: None at bedside Level of care: Telemetry Cardiac Status is: Inpatient Remains inpatient appropriate because: Acute CHF/AKI   Final disposition: Likely home once medically stable   55 minutes with more than 50% spent in reviewing records, counseling patient/family and coordinating care.   Sch Meds:  Scheduled Meds:  apixaban  10 mg Oral BID   Followed by   Cecily Cohen ON 01/19/2024] apixaban  5 mg Oral BID   carvedilol  12.5 mg Oral BID WC   fluticasone  2 spray Each Nare Daily   guaiFENesin  1,200 mg Oral BID   insulin aspart  0-9 Units Subcutaneous TID WC   insulin aspart  6 Units Subcutaneous TID WC   insulin glargine-yfgn  25 Units Subcutaneous BID   loperamide  2 mg Oral Once   loratadine  10 mg Oral Daily   pantoprazole  40 mg Oral BID   sodium bicarbonate  650 mg Oral BID   sodium chloride flush  3 mL Intravenous Q12H   sucralfate  1 g Oral TID WC & HS   thiamine  100 mg Oral Daily   Continuous Infusions:  furosemide 120 mg (01/18/24 0631)   PRN Meds:.acetaminophen, albuterol, guaiFENesin, loperamide, ondansetron (ZOFRAN) IV, sodium chloride flush  Antimicrobials: Anti-infectives (From  admission, onward)    Start     Dose/Rate Route Frequency Ordered Stop   01/16/24 1000  amoxicillin-clavulanate (AUGMENTIN) 875-125 MG per tablet 1 tablet        1 tablet Oral Every 12 hours 01/15/24 0945 01/17/24 2207   01/11/24 2200  vancomycin (VANCOREADY) IVPB 1750 mg/350 mL  Status:  Discontinued        1,750 mg 175 mL/hr over 120 Minutes Intravenous Every 48 hours 01/09/24 2351 01/11/24 1217   01/11/24 1300  cefTRIAXone (ROCEPHIN) 2 g in sodium chloride 0.9 % 100 mL IVPB  Status:  Discontinued        2 g 200 mL/hr over 30 Minutes Intravenous Daily 01/11/24 1217 01/15/24 0945   01/11/24 1300  azithromycin (ZITHROMAX) 500 mg in sodium chloride 0.9 % 250 mL IVPB  Status:  Discontinued        500 mg 250 mL/hr over 60 Minutes Intravenous Daily 01/11/24 1217 01/12/24 1929   01/10/24 0000  piperacillin-tazobactam (ZOSYN) IVPB 3.375 g  Status:  Discontinued        3.375 g 12.5 mL/hr over 240 Minutes Intravenous Every 8 hours  01/09/24 2347 01/11/24 1217   01/10/24 0000  vancomycin (VANCOREADY) IVPB 2000 mg/400 mL        2,000 mg 200 mL/hr over 120 Minutes Intravenous  Once 01/09/24 2348 01/10/24 0239   01/09/24 1200  cefTRIAXone (ROCEPHIN) 1 g in sodium chloride 0.9 % 100 mL IVPB        1 g 200 mL/hr over 30 Minutes Intravenous  Once 01/09/24 1154 01/09/24 1233   01/09/24 1200  azithromycin (ZITHROMAX) 500 mg in sodium chloride 0.9 % 250 mL IVPB        500 mg 250 mL/hr over 60 Minutes Intravenous  Once 01/09/24 1154 01/09/24 1504        I have personally reviewed the following labs and images: CBC: Recent Labs  Lab 01/14/24 0354 01/15/24 0420 01/16/24 0414 01/17/24 0351 01/18/24 0351  WBC 4.6 6.9 7.5 9.4 10.2  HGB 11.2* 13.6 13.5 13.8 13.7  HCT 33.5* 40.1 39.3 41.0 40.4  MCV 86.6 85.7 85.1 86.0 85.1  PLT 217 331 370 462* 445*   BMP &GFR Recent Labs  Lab 01/14/24 0354 01/15/24 0420 01/16/24 0414 01/17/24 0351 01/18/24 0351  NA 136 138 138 137 138  K 3.5 3.6 3.3*  3.4* 3.5  CL 104 104 102 103 100  CO2 19* 22 25 24 24   GLUCOSE 216* 187* 197* 258* 198*  BUN 56* 55* 53* 59* 59*  CREATININE 4.04* 3.45* 3.05* 2.96* 3.05*  CALCIUM 8.1* 8.6* 8.3* 8.5* 8.6*  MG  --   --   --   --  1.7  PHOS 4.0 2.9 2.8 2.5 3.9   Estimated Creatinine Clearance: 34.3 mL/min (A) (by C-G formula based on SCr of 3.05 mg/dL (H)). Liver & Pancreas: Recent Labs  Lab 01/14/24 0354 01/15/24 0420 01/16/24 0414 01/17/24 0351 01/18/24 0351  ALBUMIN 2.1* 2.5* 2.3* 2.4* 2.4*   No results for input(s): "LIPASE", "AMYLASE" in the last 168 hours. No results for input(s): "AMMONIA" in the last 168 hours. Diabetic: No results for input(s): "HGBA1C" in the last 72 hours. Recent Labs  Lab 01/17/24 1241 01/17/24 1614 01/17/24 2235 01/18/24 0615 01/18/24 1234  GLUCAP 164* 188* 236* 172* 223*   Cardiac Enzymes: No results for input(s): "CKTOTAL", "CKMB", "CKMBINDEX", "TROPONINI" in the last 168 hours. No results for input(s): "PROBNP" in the last 8760 hours. Coagulation Profile: No results for input(s): "INR", "PROTIME" in the last 168 hours. Thyroid Function Tests: No results for input(s): "TSH", "T4TOTAL", "FREET4", "T3FREE", "THYROIDAB" in the last 72 hours. Lipid Profile: No results for input(s): "CHOL", "HDL", "LDLCALC", "TRIG", "CHOLHDL", "LDLDIRECT" in the last 72 hours. Anemia Panel: No results for input(s): "VITAMINB12", "FOLATE", "FERRITIN", "TIBC", "IRON", "RETICCTPCT" in the last 72 hours. Urine analysis:    Component Value Date/Time   COLORURINE YELLOW 01/11/2024 1035   APPEARANCEUR CLOUDY (A) 01/11/2024 1035   LABSPEC 1.015 01/11/2024 1035   PHURINE 5.0 01/11/2024 1035   GLUCOSEU 150 (A) 01/11/2024 1035   HGBUR MODERATE (A) 01/11/2024 1035   BILIRUBINUR NEGATIVE 01/11/2024 1035   KETONESUR NEGATIVE 01/11/2024 1035   PROTEINUR >=300 (A) 01/11/2024 1035   UROBILINOGEN 0.2 08/07/2010 0020   NITRITE NEGATIVE 01/11/2024 1035   LEUKOCYTESUR NEGATIVE  01/11/2024 1035   Sepsis Labs: Invalid input(s): "PROCALCITONIN", "LACTICIDVEN"  Microbiology: Recent Results (from the past 240 hours)  Resp panel by RT-PCR (RSV, Flu A&B, Covid) Anterior Nasal Swab     Status: None   Collection Time: 01/09/24  5:23 AM   Specimen: Anterior Nasal Swab  Result Value  Ref Range Status   SARS Coronavirus 2 by RT PCR NEGATIVE NEGATIVE Final   Influenza A by PCR NEGATIVE NEGATIVE Final   Influenza B by PCR NEGATIVE NEGATIVE Final    Comment: (NOTE) The Xpert Xpress SARS-CoV-2/FLU/RSV plus assay is intended as an aid in the diagnosis of influenza from Nasopharyngeal swab specimens and should not be used as a sole basis for treatment. Nasal washings and aspirates are unacceptable for Xpert Xpress SARS-CoV-2/FLU/RSV testing.  Fact Sheet for Patients: BloggerCourse.com  Fact Sheet for Healthcare Providers: SeriousBroker.it  This test is not yet approved or cleared by the [place] FDA and has been authorized for detection and/or diagnosis of SARS-CoV-2 by FDA under an Emergency Use Authorization (EUA). This EUA will remain in effect (meaning this test can be used) for the duration of the COVID-19 declaration under Section 564(b)(1) of the Act, 21 U.S.C. section 360bbb-3(b)(1), unless the authorization is terminated or revoked.     Resp Syncytial Virus by PCR NEGATIVE NEGATIVE Final    Comment: (NOTE) Fact Sheet for Patients: BloggerCourse.com  Fact Sheet for Healthcare Providers: SeriousBroker.it  This test is not yet approved or cleared by the [place] FDA and has been authorized for detection and/or diagnosis of SARS-CoV-2 by FDA under an Emergency Use Authorization (EUA). This EUA will remain in effect (meaning this test can be used) for the duration of the COVID-19 declaration under Section 564(b)(1) of the Act, 21 U.S.C. section  360bbb-3(b)(1), unless the authorization is terminated or revoked.  Performed at [facility], [address], [place], [place] [address]   Culture, blood (routine x 2)     Status: None   Collection Time: 01/09/24  8:25 AM   Specimen: BLOOD LEFT FOREARM  Result Value Ref Range Status   Specimen Description BLOOD LEFT FOREARM  Final   Special Requests   Final    BOTTLES DRAWN AEROBIC AND ANAEROBIC Blood Culture results may not be optimal due to an inadequate volume of blood received in culture bottles   Culture   Final    NO GROWTH 5 DAYS Performed at [facility], [address] [place], [place] [address]    Report Status 01/14/2024 FINAL  Final  Culture, blood (routine x 2)     Status: None   Collection Time: 01/09/24  9:43 AM   Specimen: BLOOD LEFT HAND  Result Value Ref Range Status   Specimen Description BLOOD LEFT HAND  Final   Special Requests   Final    BOTTLES DRAWN AEROBIC AND ANAEROBIC Blood Culture results may not be optimal due to an inadequate volume of blood received in culture bottles   Culture   Final    NO GROWTH 5 DAYS Performed at [facility], [address] [place], [place] [address]    Report Status 01/14/2024 FINAL  Final  Gastrointestinal Panel by PCR , Stool     Status: None   Collection Time: 01/09/24  5:03 PM   Specimen: Stool  Result Value Ref Range Status   Campylobacter species NOT DETECTED NOT DETECTED Final   Plesimonas shigelloides NOT DETECTED NOT DETECTED Final   Salmonella species NOT DETECTED NOT DETECTED Final   Yersinia enterocolitica NOT DETECTED NOT DETECTED Final   Vibrio species NOT DETECTED NOT DETECTED Final   Vibrio cholerae NOT DETECTED NOT DETECTED Final   Enteroaggregative E coli (EAEC) NOT DETECTED NOT DETECTED Final   Enteropathogenic E coli (EPEC) NOT DETECTED NOT DETECTED Final  Enterotoxigenic E coli (ETEC) NOT DETECTED NOT DETECTED Final   Shiga like toxin producing E coli (STEC) NOT DETECTED NOT  DETECTED Final   Shigella/Enteroinvasive E coli (EIEC) NOT DETECTED NOT DETECTED Final   Cryptosporidium NOT DETECTED NOT DETECTED Final   Cyclospora cayetanensis NOT DETECTED NOT DETECTED Final   Entamoeba histolytica NOT DETECTED NOT DETECTED Final   Giardia lamblia NOT DETECTED NOT DETECTED Final   Adenovirus F40/41 NOT DETECTED NOT DETECTED Final   Astrovirus NOT DETECTED NOT DETECTED Final   Norovirus GI/GII NOT DETECTED NOT DETECTED Final   Rotavirus A NOT DETECTED NOT DETECTED Final   Sapovirus (I, II, IV, and V) NOT DETECTED NOT DETECTED Final    Comment: Performed at [facility], [address] [place], [place] [address]  C Difficile Quick Screen w PCR reflex     Status: None   Collection Time: 01/09/24  5:03 PM   Specimen: Stool  Result Value Ref Range Status   C Diff antigen NEGATIVE NEGATIVE Final   C Diff toxin NEGATIVE NEGATIVE Final   C Diff interpretation No C. difficile detected.  Final    Comment: Performed at [facility], [address], [place], [place] [address]  MRSA Next Gen by PCR, Nasal     Status: None   Collection Time: 01/11/24  8:47 AM   Specimen: Nasal Mucosa; Nasal Swab  Result Value Ref Range Status   MRSA by PCR Next Gen NOT DETECTED NOT DETECTED Final    Comment: (NOTE) The GeneXpert MRSA Assay (FDA approved for NASAL specimens only), is one component of a comprehensive MRSA colonization surveillance program. It is not intended to diagnose MRSA infection nor to guide or monitor treatment for MRSA infections. Test performance is not FDA approved in patients less than 40 years old. Performed at [facility], [address], [place], [place] [address]     Radiology Studies: No results found.    Jahir Halt [facility]  If 7PM-7AM, please contact night-coverage [email or web address] 01/18/2024, 1:00 PM

## 2024-01-18 NOTE — Progress Notes (Signed)
 Occupational Therapy Treatment Patient Details Name: Leroy Simmons MRN: [id number] DOB: 09/07/1971 Today's Date: 01/18/2024   History of present illness Pt is a 53 yr old male who presented 01/09/24 due to acute hypoxic respiratory failure. VQ scan showed positive for large single segmental perfusion defect within the right upper lobe compatible with PE and CT showed favor multifocal pneumonia. PMH: CHF, anxiety, depression, T2DM, HTN,  laparoscopic appendectomy 12/23 due to gangrenous appendicitis, NSTEMI, AKI, DVT, CAD   OT comments  Pt at this time presented at EOB and voicing concerns about return to work and cost of medications. At this time agreeable to session and completed first attempt of mobility with holding onto rail in halls but kept feeling "drunk" and then was educated on use of 4WW. He noted to feel good and increase in speed and double distance of ambulation. At this time no recommendations at dc as he wants to hold on getting shower seat.       If plan is discharge home, recommend the following:  Assistance with cooking/housework   Equipment Recommendations   [phone])    Recommendations for Other Services      Precautions / Restrictions Precautions Precautions: Fall Precaution/Restrictions Comments: una boots donned Restrictions Weight Bearing Restrictions Per Provider Order: No       Mobility Bed Mobility               General bed mobility comments: presented at EOB    Transfers Overall transfer level: Modified independent Equipment used: Rollator (4 wheels), None Transfers: Sit to/from Stand Sit to Stand: Modified independent (Device/Increase time)                 Balance Overall balance assessment: Needs assistance Sitting-balance support: Feet supported, No upper extremity supported Sitting balance-Leahy Scale: Good     Standing balance support: During functional activity, No upper extremity supported Standing balance-Leahy Scale:  Fair Standing balance comment: pt first attempted used rail in tthe home and then trialed 4WW and noted increase in confidecne and less "drunk" feeling                           ADL either performed or assessed with clinical judgement   ADL Overall ADL's : Needs assistance/impaired Eating/Feeding: Independent;Sitting   Grooming: Wash/dry hands;Supervision/safety   Upper Body Bathing: Modified independent;Sitting   Lower Body Bathing: Supervison/ safety;Sit to/from stand   Upper Body Dressing : Set up;Sitting   Lower Body Dressing: Supervision/safety;Sit to/from stand   Toilet Transfer: Supervision/safety;Cueing for safety;Cueing for sequencing   Toileting- Clothing Manipulation and Hygiene: Supervision/safety       Functional mobility during ADLs: Supervision/safety;Contact guard assist      Extremity/Trunk Assessment Upper Extremity Assessment Upper Extremity Assessment: Generalized weakness   Lower Extremity Assessment Lower Extremity Assessment: Defer to PT evaluation        Vision   Vision Assessment?: No apparent visual deficits   Perception Perception Perception: Within Functional Limits   Praxis Praxis Praxis: WFL   Communication Communication Communication: No apparent difficulties   Cognition Arousal: Alert Behavior During Therapy: Flat affect                                 Following commands: Intact        Cueing   Cueing Techniques: Verbal cues  Exercises      Shoulder Instructions  General Comments      Pertinent Vitals/ Pain       Pain Assessment Pain Assessment: No/denies pain  Home Living                                          Prior Functioning/Environment              Frequency  Min 1X/week        Progress Toward Goals  OT Goals(current goals can now be found in the care plan section)  Progress towards OT goals: Progressing toward goals  Acute Rehab OT  Goals Patient Stated Goal: to feel better OT Goal Formulation: With patient Time For Goal Achievement: 01/30/24 Potential to Achieve Goals: Good ADL Goals Pt Will Transfer to Toilet: with modified independence Pt Will Perform Tub/Shower Transfer: Tub transfer;with modified independence;ambulating Additional ADL Goal #1: Pt will be able to complete gathering of ADL supplies with no LOB or assist  Plan      Co-evaluation                 AM-PAC OT "6 Clicks" Daily Activity     Outcome Measure   Help from another person eating meals?: None Help from another person taking care of personal grooming?: None Help from another person toileting, which includes using toliet, bedpan, or urinal?: A Little Help from another person bathing (including washing, rinsing, drying)?: A Little Help from another person to put on and taking off regular upper body clothing?: None Help from another person to put on and taking off regular lower body clothing?: A Little 6 Click Score: 21    End of Session Equipment Utilized During Treatment: Gait belt;Rollator (4 wheels)  OT Visit Diagnosis: Unsteadiness on feet (R26.81);Other abnormalities of gait and mobility (R26.89);Repeated falls (R29.6);Muscle weakness (generalized) (M62.81)   Activity Tolerance Patient limited by fatigue   Patient Left in bed;with call bell/phone within reach   Nurse Communication Mobility status        Time: 1610-9604 OT Time Calculation (min): 40 min  Charges: OT General Charges $OT Visit: 1 Visit OT Treatments $Self Care/Home Management : 38-52 mins  Erving Heather OTR/L  Acute Rehab Services  [phone] office number   Stevphen Elders 01/18/2024, 12:33 PM

## 2024-01-18 NOTE — Telephone Encounter (Signed)
 Patient Product/process development scientist completed.    The patient is insured through [facility]. Patient has [employer], may use a copay card, and/or apply for patient assistance if available.    Ran test claim for enoxaparin (Lovenox) 100 mg/mland the current 30 day co-pay is $155.88.   This test claim was processed through [facility]- copay amounts may vary at other pharmacies due to pharmacy/plan contracts, or as the patient moves through the different stages of their insurance plan.     Morgan Arab, CPHT Pharmacy Technician III Certified Patient Advocate [facility] Patient Advocate Team Direct Number: [phone]  Fax: [phone]

## 2024-01-18 NOTE — Progress Notes (Signed)
 Patient ID: Leroy Simmons, male   DOB: Aug 03, 1971, 53 y.o.   MRN: [id number] S: Feels lightheaded this morning.  Had UNNA boots placed today. O:BP (!) 155/99 (BP Location: Left Arm)   Pulse 94   Temp 97.6 F (36.4 C) (Oral)   Resp 15   Ht 5\' 8"  (1.727 m)   Wt 106.3 kg   SpO2 95%   BMI 35.64 kg/m   Intake/Output Summary (Last 24 hours) at 01/18/2024 1132 Last data filed at 01/18/2024 0917 Gross per 24 hour  Intake 368.54 ml  Output 2705 ml  Net -2336.46 ml   Intake/Output: I/O last 3 completed shifts: In: 608.5 [P.O.:240; IV Piggyback:368.5] Out: 4505 [Urine:4505]  Intake/Output this shift:  Total I/O In: -  Out: 300 [Urine:300] Weight change: -0.677 kg Gen: NAD CVS: RRR Resp:CTA Abd: +_BS, soft, NT/ND Ext: 2+ edema, unna boots in place  Recent Labs  Lab 01/12/24 0412 01/13/24 0449 01/14/24 0354 01/15/24 0420 01/16/24 0414 01/17/24 0351 01/18/24 0351  NA 135 135 136 138 138 137 138  K 3.3* 3.4* 3.5 3.6 3.3* 3.4* 3.5  CL 105 103 104 104 102 103 100  CO2 19* 20* 19* 22 25 24 24   GLUCOSE 95 104* 216* 187* 197* 258* 198*  BUN 46* 49* 56* 55* 53* 59* 59*  CREATININE 4.15* 4.43* 4.04* 3.45* 3.05* 2.96* 3.05*  ALBUMIN 1.8* 2.2* 2.1* 2.5* 2.3* 2.4* 2.4*  CALCIUM 8.5* 8.1* 8.1* 8.6* 8.3* 8.5* 8.6*  PHOS 4.2 3.9 4.0 2.9 2.8 2.5 3.9   Liver Function Tests: Recent Labs  Lab 01/16/24 0414 01/17/24 0351 01/18/24 0351  ALBUMIN 2.3* 2.4* 2.4*   No results for input(s): "LIPASE", "AMYLASE" in the last 168 hours. No results for input(s): "AMMONIA" in the last 168 hours. CBC: Recent Labs  Lab 01/14/24 0354 01/15/24 0420 01/16/24 0414 01/17/24 0351 01/18/24 0351  WBC 4.6 6.9 7.5 9.4 10.2  HGB 11.2* 13.6 13.5 13.8 13.7  HCT 33.5* 40.1 39.3 41.0 40.4  MCV 86.6 85.7 85.1 86.0 85.1  PLT 217 331 370 462* 445*   Cardiac Enzymes: No results for input(s): "CKTOTAL", "CKMB", "CKMBINDEX", "TROPONINI" in the last 168 hours. CBG: Recent Labs  Lab 01/17/24 0545  01/17/24 1241 01/17/24 1614 01/17/24 2235 01/18/24 0615  GLUCAP 229* 164* 188* 236* 172*    Iron Studies: No results for input(s): "IRON", "TIBC", "TRANSFERRIN", "FERRITIN" in the last 72 hours. Studies/Results: No results found.  apixaban  10 mg Oral BID   Followed by   Melene Muller ON 01/19/2024] apixaban  5 mg Oral BID   carvedilol  12.5 mg Oral BID WC   fluticasone  2 spray Each Nare Daily   guaiFENesin  1,200 mg Oral BID   insulin aspart  0-9 Units Subcutaneous TID WC   insulin aspart  6 Units Subcutaneous TID WC   insulin glargine-yfgn  25 Units Subcutaneous BID   loperamide  2 mg Oral Once   loratadine  10 mg Oral Daily   pantoprazole  40 mg Oral BID   sodium bicarbonate  650 mg Oral BID   sodium chloride flush  3 mL Intravenous Q12H   sucralfate  1 g Oral TID WC & HS   thiamine  100 mg Oral Daily    BMET    Component Value Date/Time   NA 138 01/18/2024 0351   NA 139 03/29/2022 1059   K 3.5 01/18/2024 0351   CL 100 01/18/2024 0351   CO2 24 01/18/2024 0351   GLUCOSE 198 (H)  01/18/2024 0351   BUN 59 (H) 01/18/2024 0351   BUN 29 (H) 03/29/2022 1059   CREATININE 3.05 (H) 01/18/2024 0351   CALCIUM 8.6 (L) 01/18/2024 0351   GFRNONAA 24 (L) 01/18/2024 0351   GFRAA  10/26/2010 1143    >60        The eGFR has been calculated using the MDRD equation. This calculation has not been validated in all clinical situations. eGFR's persistently <60 mL/min signify possible Chronic Kidney Disease.   CBC    Component Value Date/Time   WBC 10.2 01/18/2024 0351   RBC 4.75 01/18/2024 0351   HGB 13.7 01/18/2024 0351   HCT 40.4 01/18/2024 0351   PLT 445 (H) 01/18/2024 0351   MCV 85.1 01/18/2024 0351   MCH 28.8 01/18/2024 0351   MCHC 33.9 01/18/2024 0351   RDW 12.5 01/18/2024 0351   LYMPHSABS 0.8 01/09/2024 0534   MONOABS 0.8 01/09/2024 0534   EOSABS 0.0 01/09/2024 0534   BASOSABS 0.0 01/09/2024 0534    Assessment/Plan:   AKI/CKD stage IV - in setting of PE and volume  overload.  Underlying CKD likely due to longstanding, poorly controlled DM.  BUN/Cr slowly improving towards his baseline.  Continue with current regimen for now.  Will need to f/u with our office after his discharge.  Acute PE - no evidence of DVT by dopplers.  Currently on Eliquis but can't afford as an outpatient.  Plan per primary svc Acute on chronic HFpEF - responding to IV lasix but still with significant lower extremity edema.  Unna boots placed this morning. Multifocal pneumonia - abx per primary svc DM type 2 - poorly controlled.  HTN/volume - as above.  Responding to lasix.  Will increase carvedilol to 12.5 mg and follow bp Metabolic acidosis - on sodium bicarb Hypokalemia - cont with supplementation  Benjamin Brands, MD [facility] [phone]

## 2024-01-18 NOTE — Progress Notes (Signed)
 Orthopedic Tech Progress Note Patient Details:  Leroy Simmons 01/30/1971 [id number]  Ortho Devices Type of Ortho Device: Radio broadcast assistant Ortho Device/Splint Location: BLE Ortho Device/Splint Interventions: Application   Post Interventions Patient Tolerated: Well  [name] [name] 01/18/2024, 9:15 AM

## 2024-01-19 ENCOUNTER — Other Ambulatory Visit (HOSPITAL_COMMUNITY): Payer: Self-pay

## 2024-01-19 DIAGNOSIS — I82452 Acute embolism and thrombosis of left peroneal vein: Secondary | ICD-10-CM | POA: Diagnosis not present

## 2024-01-19 DIAGNOSIS — I2510 Atherosclerotic heart disease of native coronary artery without angina pectoris: Secondary | ICD-10-CM | POA: Diagnosis not present

## 2024-01-19 DIAGNOSIS — J9601 Acute respiratory failure with hypoxia: Secondary | ICD-10-CM | POA: Diagnosis not present

## 2024-01-19 DIAGNOSIS — I2699 Other pulmonary embolism without acute cor pulmonale: Secondary | ICD-10-CM | POA: Diagnosis not present

## 2024-01-19 DIAGNOSIS — I214 Non-ST elevation (NSTEMI) myocardial infarction: Secondary | ICD-10-CM

## 2024-01-19 LAB — RENAL FUNCTION PANEL
Albumin: 2.7 g/dL — ABNORMAL LOW (ref 3.5–5.0)
Anion gap: 14 (ref 5–15)
BUN: 59 mg/dL — ABNORMAL HIGH (ref 6–20)
CO2: 24 mmol/L (ref 22–32)
Calcium: 9 mg/dL (ref 8.9–10.3)
Chloride: 99 mmol/L (ref 98–111)
Creatinine, Ser: 3.1 mg/dL — ABNORMAL HIGH (ref 0.61–1.24)
GFR, Estimated: 24 mL/min — ABNORMAL LOW (ref >60)
Glucose, Bld: 200 mg/dL — ABNORMAL HIGH (ref 70–99)
Phosphorus: 4.5 mg/dL (ref 2.5–4.6)
Potassium: 3.6 mmol/L (ref 3.5–5.1)
Sodium: 137 mmol/L (ref 135–145)

## 2024-01-19 LAB — GLUCOSE, CAPILLARY
Glucose-Capillary: 147 mg/dL — ABNORMAL HIGH (ref 70–99)
Glucose-Capillary: 154 mg/dL — ABNORMAL HIGH (ref 70–99)

## 2024-01-19 MED ORDER — INSULIN LISPRO (1 UNIT DIAL) 100 UNIT/ML (KWIKPEN)
5.0000 [IU] | PEN_INJECTOR | Freq: Three times a day (TID) | SUBCUTANEOUS | 11 refills | Status: DC
Start: 2024-01-19 — End: 2024-05-27
  Filled 2024-01-19: qty 15, 100d supply, fill #0
  Filled 2024-04-15: qty 12, 80d supply, fill #1

## 2024-01-19 MED ORDER — CARVEDILOL 12.5 MG PO TABS
12.5000 mg | ORAL_TABLET | Freq: Two times a day (BID) | ORAL | 1 refills | Status: DC
  Filled 2024-01-19: qty 180, 90d supply, fill #0
  Filled 2024-04-15: qty 180, 90d supply, fill #1

## 2024-01-19 MED ORDER — INSULIN PEN NEEDLE 32G X 4 MM MISC
0 refills | Status: DC
  Filled 2024-01-19: qty 100, 30d supply, fill #0

## 2024-01-19 MED ORDER — TORSEMIDE 100 MG PO TABS
100.0000 mg | ORAL_TABLET | Freq: Two times a day (BID) | ORAL | 0 refills | Status: DC
  Filled 2024-01-19: qty 60, 30d supply, fill #0
  Filled 2024-01-19: qty 172, 86d supply, fill #0
  Filled 2024-01-20 – 2024-03-15 (×5): qty 60, 30d supply, fill #0
  Filled 2024-04-15: qty 60, 30d supply, fill #1

## 2024-01-19 MED ORDER — PANTOPRAZOLE SODIUM 40 MG PO TBEC
40.0000 mg | DELAYED_RELEASE_TABLET | Freq: Every day | ORAL | 0 refills | Status: DC
  Filled 2024-01-19: qty 30, 30d supply, fill #0

## 2024-01-19 MED ORDER — APIXABAN 5 MG PO TABS
5.0000 mg | ORAL_TABLET | Freq: Two times a day (BID) | ORAL | 1 refills | Status: DC
  Filled 2024-01-19: qty 180, 90d supply, fill #0
  Filled 2024-02-16 – 2024-02-19 (×3): qty 180, 90d supply, fill #1
  Filled 2024-02-19 – 2024-03-15 (×3): qty 180, 90d supply, fill #0

## 2024-01-19 MED ORDER — TORSEMIDE 20 MG PO TABS: 100.0000 mg | ORAL_TABLET | Freq: Two times a day (BID) | ORAL | Status: DC

## 2024-01-19 NOTE — Inpatient Diabetes Management (Addendum)
 Inpatient Diabetes Program Recommendations  AACE/ADA: New Consensus Statement on Inpatient Glycemic Control (2015)  Target Ranges:  Prepandial:   less than 140 mg/dL      Peak postprandial:   less than 180 mg/dL (1-2 hours)      Critically ill patients:  140 - 180 mg/dL   Lab Results  Component Value Date   GLUCAP 147 (H) 01/19/2024   HGBA1C 9.3 (H) 01/11/2024    Review of Glycemic Control  Diabetes history: DM2 Outpatient Diabetes medications: Lantus  25 units BID, Novolog  5 units TID Current orders for Inpatient glycemic control: Semglee  30 units BID, Novolog  0-9 units correction scale TID, Novolog  6 units TID  Inpatient Diabetes Program Recommendations:   Received diabetes coordinator consult. Our inpatient team spoke with him at length on 01/12/24.  Patient was given a Lilly coupon for Basaglar  and Humalog  or 75/25 insulin .  Patient states that he does not mind taking 4 injections per day.   Will talk with patient to see if he would be interested in changing to the mixed insulin . Patient states that cost wise he would be better with the Basaglar  and Humalog  for month supply each for $35.   Patient states that he does have some Novolog  left at home and will probably use the rest of it.  Marjorie Lunger RN BSN CDE Diabetes Coordinator Pager: [phone]  8am-5pm

## 2024-01-19 NOTE — TOC Transition Note (Signed)
 Transition of Care (TOC) - Discharge Note Leroy Dingwall RN, BSN Transitions of Care Unit 4E- RN Case Manager See Treatment Team for direct phone #   Patient Details  Name: Leroy Simmons MRN: [id number] Date of Birth: 1970-12-12  Transition of Care [facility]) CM/SW Contact:  Leroy Cope, RN Phone Number: 01/19/2024, 2:29 PM   Clinical Narrative:    Pt stable for transition home today, orders placed for [facility] and DME needs.   CM in to speak with pt at beside- discussed HH and DME needs- pt has [facility]- DME agency [facility] only DME provider known to be in network- pt agreeable to provider.  Discussed HHRN needs, list provided for [facility] choice Per CMS guidelines from [email or web address] website with star ratings (copy placed in shadow chart)- pt voiced he does not have a preference.   Pt states he has someone to call to provide transportation home.   Address,  phone # and PCP confirmed.   Also discussed medication cost for Eliquis - PharmD has supplied pt with copay assist card. TOC pharmacy to fill meds for discharge. Pt to f/u with PCP and [facility] pharmacy with regards to future medication assistance.   Call made to [employer] liaison for DME needs- rollator to be delivered to room prior to discharge.   Calls made to following [facility] agencies: [facility]- [facility]-  no RN availability at this time [place]- referral has been accepted with plan for start of care first of next week. Info placed on AVS  No further TOC needs noted.    Final next level of care: Home w Home Health Services Barriers to Discharge: No Barriers Identified   Patient Goals and CMS Choice Patient states their goals for this hospitalization and ongoing recovery are:: return home CMS Medicare.gov Compare Post Acute Care list provided to:: Patient Choice offered to / list presented to : Patient      Discharge Placement               Home w/ [facility]        Discharge Plan and  Services Additional resources added to the After Visit Summary for     Discharge Planning Services: CM Consult, Medication Assistance Post Acute Care Choice: Home Health, Durable Medical Equipment          DME Arranged: Walker rolling with seat DME Agency: [facility] Date DME Agency Contacted: 01/19/24 Time DME Agency Contacted: 1230 Representative spoke with at DME Agency: Otho Blitz HH Arranged: RN HH Agency: [facility] Date [facility] Agency Contacted: 01/19/24 Time HH Agency Contacted: 1428 Representative spoke with at [facility] Agency: [facility]  Social Drivers of Health (SDOH) Interventions SDOH Screenings   Food Insecurity: Food Insecurity Present (01/09/2024)  Housing: High Risk (01/09/2024)  Transportation Needs: No Transportation Needs (01/09/2024)  Utilities: Not At Risk (01/09/2024)  Alcohol Screen: Low Risk  (01/20/2023)  Depression (PHQ2-9): High Risk (06/01/2023)  Financial Resource Strain: Low Risk  (01/20/2023)  Tobacco Use: Low Risk  (01/09/2024)     Readmission Risk Interventions    01/19/2024    2:29 PM 03/22/2023   11:38 AM 01/23/2023    3:20 PM  Readmission Risk Prevention Plan  Transportation Screening Complete Complete Complete  PCP or Specialist Appt within 3-5 Days   Complete  HRI or Home Care Consult   --  Social Work Consult for Recovery Care Planning/Counseling   --  Palliative Care Screening   Not Applicable  Medication Review [employer]) Complete Complete Complete  PCP or Specialist appointment within 3-5 days of discharge  Complete   HRI or Home Care Consult Complete Complete   SW Recovery Care/Counseling Consult Complete Complete   Palliative Care Screening Not Applicable Not Applicable   Skilled Nursing Facility Not Applicable Not Applicable

## 2024-01-19 NOTE — Discharge Summary (Signed)
 Physician Discharge Summary  Leroy Simmons:[id number] DOB: Sep 15, 1971 DOA: 01/09/2024  PCP: Marius Siemens, NP  Admit date: 01/09/2024 Discharge date: 01/19/24  Admitted From: Home Disposition: Home Recommendations for Outpatient Follow-up:  Follow up with PCP in 1 week Nephrology to arrange outpatient follow-up Check blood pressure, CMP and CBC at follow-up Please follow up on the following pending results: None  Home Health: HH RN Equipment/Devices: Rollator  Discharge Condition: Stable CODE STATUS: Full code  Follow-up Information     Marius Siemens, NP. Schedule an appointment as soon as possible for a visit in 1 week(s).   Specialty: Internal Medicine Contact information: [address] [place] [address] [phone]         [employer] Follow up.   Why: rollator arranged- to be delivered to room prior to discharge Contact information: [address] [place] [address] [phone]                 Hospital course 53 year old gentleman history of hypertension, CHF, diabetes type 2, CKD stage IIIb, anxiety/depression, history of DVT not on anticoagulation recurrent falls presenting with headaches, chills, cough, congestion, nausea, vomiting, diarrhea. Patient seen in the ED noted to be in acute hypoxic respiratory failure requiring O2, VQ scan done concerning for RUL PE.  CT chest suggested multifocal pneumonia versus volume overload. Patient placed on IV heparin  and empiric IV antibiotics, and admitted with working diagnosis acute respiratory failure with hypoxia in the setting of acute PE, multifocal pneumonia, acute on chronic diastolic CHF and AKI. Due to patient's worsening renal function and ongoing volume overload, nephrology consulted.  Patient was diuresed with IV Lasix  with net negative of 22 L.  Weight improved from 253 pounds to 233 pounds.  Cr plateaued at 3.0.  He was cleared for discharge by nephrology on  p.o. torsemide  100 mg twice daily.  Nephrology to arrange outpatient follow-up.  In regards to multifocal pneumonia, patient completed antibiotic course in house.  He was liberated off oxygen.  In regards to acute PE, he is discharged on p.o. Eliquis .   See individual problem list below for more.   Problems addressed during this hospitalization Acute hypoxic respiratory failure with hypoxia-multifactorial including PE, multifocal pneumonia and CHF.  Noted to be hypoxic to 86% on RA on presentation.  Respiratory failure resolved.   Acute PE: VQ scan positive for large single segmental perfusion defect within the RUL.  LE venous Doppler negative for DVT.  TTE with LVEF of 60 to 65%, WMA, normal RV SF but moderately elevated PASP.  He was on IV heparin  and transitioned to p.o. Eliquis . - Discharged on p.o. Eliquis .   Multifocal pneumonia: CT chest without contrast showed nodularity throughout both lungs with basilar predominance suggesting multifocal pneumonia. -Completed antibiotic course from 4/8-/16   Acute on chronic diastolic CHF: TTE as above.  Still with significant BLE edema and bibasilar crackles.  Aggressively diuresed with IV Lasix  with nephrologist guidance.  Net -22 L.  Weight down from 253 pounds to 233 pounds.  Creatinine plateaued at 3.0.  Cleared for discharge on p.o. torsemide  100 mg twice daily -Torsemide  100 mg twice daily -Leg elevation and Unna boot.  [facility] RN ordered. - Amlodipine  discontinued.   AKI on CKD-3B: Baseline Cr appears to be about 2.3.  Seems to be leveling off at about 3.0 -Cleared for discharge on torsemide  100 mg twice daily as recommended by nephrology -Discontinue lisinopril  to allow renal recovery   Poorly controlled DM-2 with  hyperglycemia: A1c 9.3% (was 10.8% on 12/7). -Continue home Lantus  45 units twice daily -Changed NovoLog  to Humalog  due to insurance preference   Diarrhea: Felt to be due to viral gastroenteritis.  C. difficile and GI panel  negative.  Resolved.   Uncontrolled hypertension: BP improved. -Discontinued amlodipine  due to edema and lisinopril  due to AKI -Discharged on Coreg  12.5 mg twice daily  -Torsemide  as above.   Hypokalemia: Resolved.   Possible herpes labialis -Supportive care.  Doubt utility of Valtrex at this point   Class II obesity Body mass index is 35.52 kg/m. -Encourage lifestyle change to lose weight -Consider GLP-1 agonist outpatient.            Time spent 35 minutes  Vital signs Vitals:   01/18/24 2314 01/19/24 0311 01/19/24 0748 01/19/24 1113  BP: (!) 144/92 (!) 136/93 (!) 164/99 (!) 143/89  Pulse: 89 90 90 91  Temp: 97.6 F (36.4 C) 98.2 F (36.8 C) 98.2 F (36.8 C) 98.3 F (36.8 C)  Resp: 20 20 20 13   Height:      Weight:  106 kg    SpO2: 96% 93% 94% 96%  TempSrc: Oral Oral Oral Oral  BMI (Calculated):  35.53       Discharge exam  GENERAL: No apparent distress.  Nontoxic. HEENT: MMM.  Vision and hearing grossly intact.  NECK: Supple.  No apparent JVD.  RESP:  No IWOB.  Fair aeration bilaterally. CVS:  RRR. Heart sounds normal.  ABD/GI/GU: BS+. Abd soft, NTND.  MSK/EXT:  Moves extremities.  Unna boots bilaterally.  1+ bilateral pedal edema. SKIN: no apparent skin lesion or wound NEURO: Awake and alert. Oriented appropriately.  No apparent focal neuro deficit. PSYCH: Calm. Normal affect.   Discharge Instructions Discharge Instructions     Diet - low sodium heart healthy   Complete by: As directed    Diet Carb Modified   Complete by: As directed    Discharge instructions   Complete by: As directed    It has been a pleasure taking care of you!  You were hospitalized due to pulmonary embolism (blood clot in your lung), pneumonia (lung infection), heart failure exacerbation and acute kidney failure.  You have been started on blood thinner for blood clot.  You have completed antibiotic course for pneumonia.  You have been treated with intravenous Lasix  for heart  failure exacerbation.  Your kidney function seems to have stabilized.  We are discharging you on blood thinner for blood clot.  We also made some changes to your home medications during this hospitalization.  Please review your new medication list and the directions on your medications before you take them.  Follow-up with your primary care doctor in 1 to 2 weeks or sooner if needed.  Follow-up with your cardiologist and nephrologist per their recommendation.   Take care,   Increase activity slowly   Complete by: As directed       Allergies as of 01/19/2024   No Known Allergies      Medication List     STOP taking these medications    amLODipine  10 MG tablet Commonly known as: NORVASC    doxycycline  100 MG tablet Commonly known as: VIBRA -TABS   lisinopril  10 MG tablet Commonly known as: ZESTRIL    NovoLOG  FlexPen 100 UNIT/ML FlexPen Generic drug: insulin  aspart       TAKE these medications    albuterol  108 (90 Base) MCG/ACT inhaler Commonly known as: VENTOLIN  HFA Inhale 2 puffs into the lungs every  6 (six) hours as needed for wheezing or shortness of breath.   apixaban  5 MG Tabs tablet Commonly known as: ELIQUIS  Take 1 tablet (5 mg total) by mouth 2 (two) times daily.   Basaglar  KwikPen 100 UNIT/ML Inject 45 Units into the skin 2 (two) times daily.   blood glucose meter kit and supplies Dispense based on patient and insurance preference. Use up to four times daily as directed. (FOR ICD-10 E10.9, E11.9).   carvedilol  12.5 MG tablet Commonly known as: COREG  Take 1 tablet (12.5 mg total) by mouth 2 (two) times daily with a meal.   chlorhexidine  4 % external liquid Commonly known as: HIBICLENS  Apply topically daily as needed.   insulin  lispro 100 UNIT/ML KwikPen Commonly known as: HUMALOG  Inject 5 Units into the skin 3 (three) times daily before meals.   pantoprazole  40 MG tablet Commonly known as: PROTONIX  Take 1 tablet (40 mg total) by mouth daily.    polyethylene glycol powder 17 GM/SCOOP powder Commonly known as: GLYCOLAX /MIRALAX  Take 17 g by mouth 2 (two) times daily. What changed:  when to take this reasons to take this   Senexon-S 8.6-50 MG tablet Generic drug: senna-docusate Take 1 tablet by mouth 2 (two) times daily. What changed:  when to take this reasons to take this   torsemide  100 MG tablet Commonly known as: DEMADEX  Take 1 tablet (100 mg total) by mouth 2 (two) times daily. What changed:  medication strength how much to take               Durable Medical Equipment  (From admission, onward)           Start     Ordered   01/19/24 1214  For home use only DME 4 wheeled rolling walker with seat  Once       Question:  Patient needs a walker to treat with the following condition  Answer:  Generalized weakness   01/19/24 1213            Consultations: Nephrology  Procedures/Studies:   US  RENAL Result Date: 01/11/2024 CLINICAL DATA:  Acute renal insufficiency. EXAM: RENAL / URINARY TRACT ULTRASOUND COMPLETE COMPARISON:  CT abdomen pelvis dated 01/09/2024. FINDINGS: Right Kidney: Renal measurements: 11.2 x 6.1 x 5.3 cm = volume: 191 mL. Increased renal parenchymal echogenicity. No hydronephrosis or shadowing stone. Left Kidney: Renal measurements: 11.6 x 6.7 x 3.8 cm = volume: 154 mL. Increased renal parenchymal echogenicity. No hydronephrosis or shadowing stone. Bladder: Appears normal for degree of bladder distention. Other: None. IMPRESSION: Mildly echogenic kidneys likely related to medical renal disease. No hydronephrosis or shadowing stone. Electronically Signed   By: Angus Bark M.D.   On: 01/11/2024 14:54   ECHOCARDIOGRAM COMPLETE BUBBLE STUDY Result Date: 01/10/2024    ECHOCARDIOGRAM REPORT   Patient Name:   Leroy Simmons Date of Exam: 01/10/2024 Medical Rec #:  [id number]      Height:       68.0 in Accession #:    [id number]     Weight:       253.0 lb Date of Birth:  11/16/1970      BSA:           2.258 m Patient Age:    52 years       BP:           148/72 mmHg Patient Gender: M              HR:  97 bpm. Exam Location:  Inpatient Procedure: 2D Echo, Cardiac Doppler, Color Doppler and Saline Contrast Bubble            Study (Both Spectral and Color Flow Doppler were utilized during            procedure). Indications:    Pulmonary Embolus                 Acute hypoxemic respiratory failure  History:        Patient has prior history of Echocardiogram examinations, most                 recent 01/19/2023. CHF and Cardiomegaly, Previous Myocardial                 Infarction and CAD, Pulmonary HTN and CKD, stage 4,                 Signs/Symptoms:Chest Pain; Risk Factors:Hypertension, Diabetes                 and Dyslipidemia.  Sonographer:    Terrilee Few RCS Referring Phys: [phone] EKTA V PATEL IMPRESSIONS  1. Left ventricular ejection fraction, by estimation, is 60 to 65%. The left ventricle has normal function. The left ventricle has no regional wall motion abnormalities. There is mild left ventricular hypertrophy. Left ventricular diastolic parameters are indeterminate.  2. Right ventricular systolic function is normal. The right ventricular size is normal. Tricuspid regurgitation signal is inadequate for assessing PA pressure.  3. Left atrial size was mildly dilated.  4. The mitral valve is grossly normal. Trivial mitral valve regurgitation. No evidence of mitral stenosis.  5. The aortic valve is grossly normal. There is mild calcification of the aortic valve. Aortic valve regurgitation is not visualized. No aortic stenosis is present.  6. The inferior vena cava is normal in size with greater than 50% respiratory variability, suggesting right atrial pressure of 3 mmHg.  7. Agitated saline contrast bubble study was negative, with no evidence of any interatrial shunt. Comparison(s): No significant change from prior study. FINDINGS  Left Ventricle: Left ventricular ejection fraction, by  estimation, is 60 to 65%. The left ventricle has normal function. The left ventricle has no regional wall motion abnormalities. The left ventricular internal cavity size was normal in size. There is  mild left ventricular hypertrophy. Left ventricular diastolic parameters are indeterminate. Right Ventricle: The right ventricular size is normal. Right vetricular wall thickness was not well visualized. Right ventricular systolic function is normal. Tricuspid regurgitation signal is inadequate for assessing PA pressure. Left Atrium: Left atrial size was mildly dilated. Right Atrium: Right atrial size was normal in size. Pericardium: Trivial pericardial effusion is present. Mitral Valve: The mitral valve is grossly normal. Trivial mitral valve regurgitation. No evidence of mitral valve stenosis. Tricuspid Valve: The tricuspid valve is grossly normal. Tricuspid valve regurgitation is trivial. No evidence of tricuspid stenosis. Aortic Valve: The aortic valve is grossly normal. There is mild calcification of the aortic valve. Aortic valve regurgitation is not visualized. No aortic stenosis is present. Aortic valve peak gradient measures 6.7 mmHg. Pulmonic Valve: The pulmonic valve was not well visualized. Pulmonic valve regurgitation is not visualized. No evidence of pulmonic stenosis. Aorta: The aortic root, ascending aorta, aortic arch and descending aorta are all structurally normal, with no evidence of dilitation or obstruction. Venous: The inferior vena cava is normal in size with greater than 50% respiratory variability, suggesting right atrial pressure of 3 mmHg. IAS/Shunts: The atrial septum  is grossly normal. Agitated saline contrast was given intravenously to evaluate for intracardiac shunting. Agitated saline contrast bubble study was negative, with no evidence of any interatrial shunt.  LEFT VENTRICLE PLAX 2D LVIDd:         4.30 cm   Diastology LVIDs:         2.80 cm   LV e' medial:    7.89 cm/s LV PW:          1.30 cm   LV E/e' medial:  18.8 LV IVS:        0.90 cm   LV e' lateral:   9.20 cm/s LVOT diam:     2.30 cm   LV E/e' lateral: 16.1 LV SV:         72 LV SV Index:   32 LVOT Area:     4.15 cm  RIGHT VENTRICLE             IVC RV S prime:     13.70 cm/s  IVC diam: 1.70 cm TAPSE (M-mode): 2.3 cm LEFT ATRIUM             Index        RIGHT ATRIUM           Index LA diam:        3.70 cm 1.64 cm/m   RA Area:     19.30 cm LA Vol (A2C):   70.7 ml 31.30 ml/m  RA Volume:   55.40 ml  24.53 ml/m LA Vol (A4C):   67.7 ml 29.98 ml/m LA Biplane Vol: 70.9 ml 31.39 ml/m  AORTIC VALVE AV Area (Vmax): 3.49 cm AV Vmax:        129.00 cm/s AV Peak Grad:   6.7 mmHg LVOT Vmax:      108.40 cm/s LVOT Vmean:     72.420 cm/s LVOT VTI:       0.172 m  AORTA Ao Root diam: 3.10 cm Ao Asc diam:  3.50 cm MITRAL VALVE MV Area (PHT): 5.02 cm     SHUNTS MV Decel Time: 151 msec     Systemic VTI:  0.17 m MV E velocity: 148.00 cm/s  Systemic Diam: 2.30 cm MV A velocity: 48.00 cm/s MV E/A ratio:  3.08 Sheryle Donning MD Electronically signed by Sheryle Donning MD Signature Date/Time: 01/10/2024/4:40:23 PM    Final    VAS US  LOWER EXTREMITY VENOUS (DVT) (7a-7p) Result Date: 01/09/2024  Lower Venous DVT Study Patient Name:  Leroy Simmons  Date of Exam:   01/09/2024 Medical Rec #: [id number]       Accession #:    [id number] Date of Birth: 03/22/1971       Patient Gender: M Patient Age:   62 years Exam Location:  [facility] Procedure:      VAS US  LOWER EXTREMITY VENOUS (DVT) Referring Phys: Geralyn Knee [place] --------------------------------------------------------------------------------  Indications: Swelling, and Edema.  Risk Factors: Obesity. Comparison Study: No changes seen since previous exam 04/18/23. Performing Technologist: Estanislao Heimlich  Examination Guidelines: A complete evaluation includes B-mode imaging, spectral Doppler, color Doppler, and power Doppler as needed of all accessible portions of each vessel. Bilateral testing is  considered an integral part of a complete examination. Limited examinations for reoccurring indications may be performed as noted. The reflux portion of the exam is performed with the patient in reverse Trendelenburg.  +---------+---------------+---------+-----------+----------+--------------+ RIGHT    CompressibilityPhasicitySpontaneityPropertiesThrombus Aging +---------+---------------+---------+-----------+----------+--------------+ CFV      Full  Yes      Yes                                 +---------+---------------+---------+-----------+----------+--------------+ SFJ      Full                                                        +---------+---------------+---------+-----------+----------+--------------+ FV Prox  Full                                                        +---------+---------------+---------+-----------+----------+--------------+ FV Mid   Full                                                        +---------+---------------+---------+-----------+----------+--------------+ FV DistalFull                                                        +---------+---------------+---------+-----------+----------+--------------+ PFV      Full                                                        +---------+---------------+---------+-----------+----------+--------------+ POP      Full           Yes      Yes                                 +---------+---------------+---------+-----------+----------+--------------+ PTV      Full                                                        +---------+---------------+---------+-----------+----------+--------------+ PERO     Full                                                        +---------+---------------+---------+-----------+----------+--------------+   +---------+---------------+---------+-----------+----------+--------------+ LEFT      CompressibilityPhasicitySpontaneityPropertiesThrombus Aging +---------+---------------+---------+-----------+----------+--------------+ CFV      Full           Yes      Yes                                 +---------+---------------+---------+-----------+----------+--------------+ SFJ  Full                                                        +---------+---------------+---------+-----------+----------+--------------+ FV Prox  Full                                                        +---------+---------------+---------+-----------+----------+--------------+ FV Mid   Full                                                        +---------+---------------+---------+-----------+----------+--------------+ FV DistalFull                                                        +---------+---------------+---------+-----------+----------+--------------+ PFV      Full                                                        +---------+---------------+---------+-----------+----------+--------------+ POP      Full           Yes      Yes                                 +---------+---------------+---------+-----------+----------+--------------+ PTV      Full                                                        +---------+---------------+---------+-----------+----------+--------------+ PERO     Partial                                                     +---------+---------------+---------+-----------+----------+--------------+     Summary: BILATERAL: - No evidence of deep vein thrombosis seen in the lower extremities, bilaterally. -No evidence of popliteal cyst, bilaterally.   *See table(s) above for measurements and observations. Electronically signed by Angela Kell MD on 01/09/2024 at 7:49:29 PM.    Final    CT CHEST WO CONTRAST Result Date: 01/09/2024 CLINICAL DATA:  Chest pain. EXAM: CT CHEST WITHOUT CONTRAST TECHNIQUE: Multidetector CT imaging of the  chest was performed following the standard protocol without IV contrast. RADIATION DOSE REDUCTION: This exam was performed according to the departmental dose-optimization program which includes automated exposure control, adjustment of the mA and/or kV according to patient size and/or use of iterative reconstruction technique. COMPARISON:  Lung bases from abdominopelvic CT earlier today. Chest CT 03/19/2023 FINDINGS: Occasion scanned in decubitus position. Cardiovascular: The heart is enlarged. There are coronary artery calcifications. Small pericardial effusion. Aortic atherosclerosis without aneurysm. Mediastinum/Nodes: Mild mediastinal adenopathy. 13 mm paratracheal node series 8, image 32. Hilar assessment is limited in the absence of IV contrast. Decompressed esophagus. Lungs/Pleura: Diffuse and multifocal ground-glass and ground-glass nodularity throughout both lungs, with a basilar predominant distribution. Findings are progressed from CT earlier today. Mild associated smooth septal thickening. There is central bronchial thickening. No significant pleural effusion. Upper Abdomen: Assessed on abdominopelvic CT earlier today. Musculoskeletal: Dependent skin thickening with areas of subcutaneous edema, series 8, image 57 to the left of midline and series 8, image 11 to the right of midline. No obvious drainable collection in the absence of IV contrast. There are no acute or suspicious osseous abnormalities. IMPRESSION: 1. Diffuse and multifocal ground-glass and ground-glass nodularity throughout both lungs, with a basilar predominant distribution. Findings are progressed from CT earlier today, favor multifocal pneumonia. 2. Smooth septal thickening is suspicious for pulmonary edema. 3. Mild mediastinal adenopathy, likely reactive. 4. Cardiomegaly with coronary artery calcifications and small pericardial effusion. 5. Dependent skin thickening with areas of subcutaneous edema, may represent cellulitis. No obvious  drainable collection in the absence of IV contrast. Aortic Atherosclerosis (ICD10-I70.0). Electronically Signed   By: Chadwick Colonel M.D.   On: 01/09/2024 18:29   NM Pulmonary Perfusion Addendum Date: 01/09/2024 ADDENDUM REPORT: 01/09/2024 14:45 ADDENDUM: Critical Value/emergent results were called by telephone at the time of interpretation on 01/09/2024 at 2:45 pm to provider Jerilynn Montenegro , who verbally acknowledged these results. Electronically Signed   By: Kimberley Penman M.D.   On: 01/09/2024 14:45   Result Date: 01/09/2024 CLINICAL DATA:  Evaluate for acute pulmonary embolism. EXAM: NUCLEAR MEDICINE PERFUSION LUNG SCAN TECHNIQUE: Perfusion images were obtained in multiple projections after intravenous injection of radiopharmaceutical. Ventilation scans intentionally deferred if perfusion scan and chest x-ray adequate for interpretation during COVID 19 epidemic. RADIOPHARMACEUTICALS:  4.1 mCi Tc-52m MAA IV COMPARISON:  Chest radiograph from 01/09/2024 FINDINGS: There is a large perfusion defect identified within the lateral aspect of the right upper lobe without corresponding chest radiograph abnormality. No additional segmental perfusion defects identified. IMPRESSION: Examination is positive for single large segmental perfusion defect within the right upper lobe compatible with pulmonary embolism. Electronically Signed: By: Kimberley Penman M.D. On: 01/09/2024 14:28   CT ABDOMEN PELVIS WO CONTRAST Result Date: 01/09/2024 CLINICAL DATA:  Nausea, vomiting, right lower quadrant abdominal pain. EXAM: CT ABDOMEN AND PELVIS WITHOUT CONTRAST TECHNIQUE: Multidetector CT imaging of the abdomen and pelvis was performed following the standard protocol without IV contrast. RADIATION DOSE REDUCTION: This exam was performed according to the departmental dose-optimization program which includes automated exposure control, adjustment of the mA and/or kV according to patient size and/or use of iterative reconstruction  technique. COMPARISON:  September 25, 2022. FINDINGS: Lower chest: Mild bibasilar opacities are noted concerning for possible pneumonia. Stable 7 mm left basilar nodule. This can be considered benign at this point with no further follow-up required. Hepatobiliary: Small gallstone is noted. No biliary dilatation. Liver is unremarkable. Pancreas: Unremarkable. No pancreatic ductal dilatation or surrounding inflammatory changes. Spleen: Normal in size without focal abnormality. Adrenals/Urinary Tract: Adrenal glands are unremarkable. Kidneys are normal, without renal calculi, focal lesion, or hydronephrosis. Bladder is unremarkable. Stomach/Bowel: Stomach is unremarkable. Status post appendectomy. There is no evidence of bowel obstruction or inflammation. Vascular/Lymphatic: No significant vascular findings are present. No  enlarged abdominal or pelvic lymph nodes. Reproductive: Prostate is unremarkable. Other: No ascites or hernia. Musculoskeletal: No acute or significant osseous findings. IMPRESSION: Mild bibasilar opacities are noted in visualized lung bases concerning for possible pneumonia. Small gallstone. No other significant abnormality seen in the abdomen or pelvis. Electronically Signed   By: Rosalene Colon M.D.   On: 01/09/2024 10:55   DG Chest 2 View Result Date: 01/09/2024 CLINICAL DATA:  Infection. EXAM: CHEST - 2 VIEW COMPARISON:  09/13/2023 FINDINGS: Cardiac enlargement. No pleural fluid. Pulmonary vascular congestion with increased interstitial markings are noted bilaterally. No airspace consolidation identified. Visualized osseous structures are unremarkable. IMPRESSION: 1. Cardiac enlargement and pulmonary vascular congestion. 2. Increased interstitial markings bilaterally may reflect interstitial edema or atypical infection. Electronically Signed   By: Kimberley Penman M.D.   On: 01/09/2024 06:49       The results of significant diagnostics from this hospitalization (including imaging,  microbiology, ancillary and laboratory) are listed below for reference.     Microbiology: Recent Results (from the past 240 hours)  Gastrointestinal Panel by PCR , Stool     Status: None   Collection Time: 01/09/24  5:03 PM   Specimen: Stool  Result Value Ref Range Status   Campylobacter species NOT DETECTED NOT DETECTED Final   Plesimonas shigelloides NOT DETECTED NOT DETECTED Final   Salmonella species NOT DETECTED NOT DETECTED Final   Yersinia enterocolitica NOT DETECTED NOT DETECTED Final   Vibrio species NOT DETECTED NOT DETECTED Final   Vibrio cholerae NOT DETECTED NOT DETECTED Final   Enteroaggregative E coli (EAEC) NOT DETECTED NOT DETECTED Final   Enteropathogenic E coli (EPEC) NOT DETECTED NOT DETECTED Final   Enterotoxigenic E coli (ETEC) NOT DETECTED NOT DETECTED Final   Shiga like toxin producing E coli (STEC) NOT DETECTED NOT DETECTED Final   Shigella/Enteroinvasive E coli (EIEC) NOT DETECTED NOT DETECTED Final   Cryptosporidium NOT DETECTED NOT DETECTED Final   Cyclospora cayetanensis NOT DETECTED NOT DETECTED Final   Entamoeba histolytica NOT DETECTED NOT DETECTED Final   Giardia lamblia NOT DETECTED NOT DETECTED Final   Adenovirus F40/41 NOT DETECTED NOT DETECTED Final   Astrovirus NOT DETECTED NOT DETECTED Final   Norovirus GI/GII NOT DETECTED NOT DETECTED Final   Rotavirus A NOT DETECTED NOT DETECTED Final   Sapovirus (I, II, IV, and V) NOT DETECTED NOT DETECTED Final    Comment: Performed at [facility], [address] [place], [place] [address]  C Difficile Quick Screen w PCR reflex     Status: None   Collection Time: 01/09/24  5:03 PM   Specimen: Stool  Result Value Ref Range Status   C Diff antigen NEGATIVE NEGATIVE Final   C Diff toxin NEGATIVE NEGATIVE Final   C Diff interpretation No C. difficile detected.  Final    Comment: Performed at [facility], [address], [place], [place] [address]  MRSA Next Gen by PCR, Nasal     Status:  None   Collection Time: 01/11/24  8:47 AM   Specimen: Nasal Mucosa; Nasal Swab  Result Value Ref Range Status   MRSA by PCR Next Gen NOT DETECTED NOT DETECTED Final    Comment: (NOTE) The GeneXpert MRSA Assay (FDA approved for NASAL specimens only), is one component of a comprehensive MRSA colonization surveillance program. It is not intended to diagnose MRSA infection nor to guide or monitor treatment for MRSA infections. Test performance is not FDA approved in patients less than 2 years  old. Performed at [facility], [address] [place], [place] [address]      Labs:  CBC: Recent Labs  Lab 01/14/24 0354 01/15/24 0420 01/16/24 0414 01/17/24 0351 01/18/24 0351  WBC 4.6 6.9 7.5 9.4 10.2  HGB 11.2* 13.6 13.5 13.8 13.7  HCT 33.5* 40.1 39.3 41.0 40.4  MCV 86.6 85.7 85.1 86.0 85.1  PLT 217 331 370 462* 445*   BMP &GFR Recent Labs  Lab 01/15/24 0420 01/16/24 0414 01/17/24 0351 01/18/24 0351 01/19/24 0347  NA 138 138 137 138 137  K 3.6 3.3* 3.4* 3.5 3.6  CL 104 102 103 100 99  CO2 22 25 24 24 24   GLUCOSE 187* 197* 258* 198* 200*  BUN 55* 53* 59* 59* 59*  CREATININE 3.45* 3.05* 2.96* 3.05* 3.10*  CALCIUM  8.6* 8.3* 8.5* 8.6* 9.0  MG  --   --   --  1.7  --   PHOS 2.9 2.8 2.5 3.9 4.5   Estimated Creatinine Clearance: 33.6 mL/min (A) (by C-G formula based on SCr of 3.1 mg/dL (H)). Liver & Pancreas: Recent Labs  Lab 01/15/24 0420 01/16/24 0414 01/17/24 0351 01/18/24 0351 01/19/24 0347  ALBUMIN  2.5* 2.3* 2.4* 2.4* 2.7*   No results for input(s): "LIPASE", "AMYLASE" in the last 168 hours. No results for input(s): "AMMONIA" in the last 168 hours. Diabetic: No results for input(s): "HGBA1C" in the last 72 hours. Recent Labs  Lab 01/18/24 1234 01/18/24 1706 01/18/24 2129 01/19/24 0601 01/19/24 1115  GLUCAP 223* 259* 304* 147* 154*   Cardiac Enzymes: No results for input(s): "CKTOTAL", "CKMB", "CKMBINDEX", "TROPONINI" in the last 168 hours. No  results for input(s): "PROBNP" in the last 8760 hours. Coagulation Profile: No results for input(s): "INR", "PROTIME" in the last 168 hours. Thyroid Function Tests: No results for input(s): "TSH", "T4TOTAL", "FREET4", "T3FREE", "THYROIDAB" in the last 72 hours. Lipid Profile: No results for input(s): "CHOL", "HDL", "LDLCALC", "TRIG", "CHOLHDL", "LDLDIRECT" in the last 72 hours. Anemia Panel: No results for input(s): "VITAMINB12", "FOLATE", "FERRITIN", "TIBC", "IRON", "RETICCTPCT" in the last 72 hours. Urine analysis:    Component Value Date/Time   COLORURINE YELLOW 01/11/2024 1035   APPEARANCEUR CLOUDY (A) 01/11/2024 1035   LABSPEC 1.015 01/11/2024 1035   PHURINE 5.0 01/11/2024 1035   GLUCOSEU 150 (A) 01/11/2024 1035   HGBUR MODERATE (A) 01/11/2024 1035   BILIRUBINUR NEGATIVE 01/11/2024 1035   KETONESUR NEGATIVE 01/11/2024 1035   PROTEINUR >=300 (A) 01/11/2024 1035   UROBILINOGEN 0.2 08/07/2010 0020   NITRITE NEGATIVE 01/11/2024 1035   LEUKOCYTESUR NEGATIVE 01/11/2024 1035   Sepsis Labs: Invalid input(s): "PROCALCITONIN", "LACTICIDVEN"   SIGNED:  Theadore Finger, MD  [facility] 01/19/2024, 1:23 PM

## 2024-01-19 NOTE — Plan of Care (Signed)
  Problem: Fluid Volume: Goal: Ability to maintain a balanced intake and output will improve Outcome: Not Progressing

## 2024-01-19 NOTE — Progress Notes (Signed)
 Physical Therapy Treatment Patient Details Name: Leroy Simmons MRN: [id number] DOB: [birth year] Today's Date: 01/19/2024   History of Present Illness Pt is a 53 yr old male who presented 01/09/24 due to acute hypoxic respiratory failure. VQ scan showed positive for large single segmental perfusion defect within the right upper lobe compatible with PE and CT showed favor multifocal pneumonia. PMH: CHF, anxiety, depression, T2DM, HTN,  laparoscopic appendectomy 12/23 due to gangrenous appendicitis, NSTEMI, AKI, DVT, CAD    PT Comments  Pt received sitting EOB and agreeable to therapy session, and continuing to make progress to acute goals. Educated pt on rollator use with pt able to demonstrate and verbalize understanding. Pt demonstrated sit<>stand with supervision for safety. Pt continuing to progress gait tolerance with rollator support this session with no c/o of dizziness during activity, with supervision for safety. Pt continues to benefit from skilled PT services to progress toward functional mobility goals.        If plan is discharge home, recommend the following: Assist for transportation   Can travel by private Psychologist, Clinical (4 wheels)    Recommendations for Other Services       Precautions / Restrictions Precautions Precautions: Fall Precaution/Restrictions Comments: una boots donned Restrictions Weight Bearing Restrictions Per Provider Order: No     Mobility  Bed Mobility Overal bed mobility: Modified Independent             General bed mobility comments: presented at EOB    Transfers Overall transfer level: Modified independent Equipment used: Rollator (4 wheels), None Transfers: Sit to/from Stand Sit to Stand: Modified independent (Device/Increase time)           General transfer comment: supervision for saftey    Ambulation/Gait Ambulation/Gait assistance: Supervision Gait Distance (Feet): 350 Feet Assistive  device: Standup Rollator Gait Pattern/deviations: WFL(Within Functional Limits) Gait velocity: decr     General Gait Details: No c/o's of dizziness during session   Stairs             Wheelchair Mobility     Tilt Bed    Modified Rankin (Stroke Patients Only)       Balance Overall balance assessment: No apparent balance deficits (not formally assessed) Sitting-balance support: Feet supported, No upper extremity supported Sitting balance-Leahy Scale: Good Sitting balance - Comments: no LOB during session   Standing balance support: Bilateral upper extremity supported, During functional activity Standing balance-Leahy Scale: Fair Standing balance comment: no LOB during session                            Communication Communication Communication: No apparent difficulties  Cognition Arousal: Alert Behavior During Therapy: WFL for tasks assessed/performed   PT - Cognitive impairments: No apparent impairments                       PT - Cognition Comments: pt A&Ox4 Following commands: Intact      Cueing Cueing Techniques: Verbal cues  Exercises      General Comments General comments (skin integrity, edema, etc.): VSS on RA, no c/o dizziness during session      Pertinent Vitals/Pain Pain Assessment Pain Assessment: No/denies pain Pain Intervention(s): Monitored during session    Home Living                          Prior Function  PT Goals (current goals can now be found in the care plan section) Acute Rehab PT Goals Patient Stated Goal: go to a petting zoo PT Goal Formulation: With patient Time For Goal Achievement: 01/30/24 Progress towards PT goals: Progressing toward goals    Frequency    Min 2X/week      PT Plan      Co-evaluation              AM-PAC PT 6 Clicks Mobility   Outcome Measure  Help needed turning from your back to your side while in a flat bed without using bedrails?:  None Help needed moving from lying on your back to sitting on the side of a flat bed without using bedrails?: None Help needed moving to and from a bed to a chair (including a wheelchair)?: A Little Help needed standing up from a chair using your arms (e.g., wheelchair or bedside chair)?: A Little Help needed to walk in hospital room?: A Little Help needed climbing 3-5 steps with a railing? : A Little 6 Click Score: 20    End of Session Equipment Utilized During Treatment: Gait belt Activity Tolerance: Patient tolerated treatment well Patient left: in chair;with call bell/phone within reach Nurse Communication: Mobility status PT Visit Diagnosis: Unsteadiness on feet (R26.81);Muscle weakness (generalized) (M62.81);Difficulty in walking, not elsewhere classified (R26.2)     Time: 8945-8888 PT Time Calculation (min) (ACUTE ONLY): 17 min  Charges:    $Gait Training: 8-22 mins PT General Charges $$ ACUTE PT VISIT: 1 Visit                     Lauraine Luann JACQUE Lauraine 01/19/2024, 1:03 PM

## 2024-01-19 NOTE — Progress Notes (Signed)
 Reviewed AVS med details with patient.  Case manager still working on [employer] to finish discharge

## 2024-01-19 NOTE — Progress Notes (Signed)
 Patient ID: CRUZITO STANDRE, male   DOB: 23-Oct-1970, 53 y.o.   MRN: [id number] S: No new complaints O:BP (!) 143/89 (BP Location: Left Arm)   Pulse 91   Temp 98.3 F (36.8 C) (Oral)   Resp 13   Ht 5\' 8"  (1.727 m)   Wt 106 kg   SpO2 96%   BMI 35.52 kg/m   Intake/Output Summary (Last 24 hours) at 01/19/2024 1157 Last data filed at 01/19/2024 0602 Gross per 24 hour  Intake 607.46 ml  Output 1850 ml  Net -1242.54 ml   Intake/Output: I/O last 3 completed shifts: In: 976 [P.O.:480; IV Piggyback:496] Out: 3330 [Urine:3330]  Intake/Output this shift:  No intake/output data recorded. Weight change: -0.363 kg Gen: NAD CVS: RRR Resp:CTA Abd: +BS, soft, NT/ND Ext: 2+ edema BLE, unna boots in place  Recent Labs  Lab 01/13/24 0449 01/14/24 0354 01/15/24 0420 01/16/24 0414 01/17/24 0351 01/18/24 0351 01/19/24 0347  NA 135 136 138 138 137 138 137  K 3.4* 3.5 3.6 3.3* 3.4* 3.5 3.6  CL 103 104 104 102 103 100 99  CO2 20* 19* 22 25 24 24 24   GLUCOSE 104* 216* 187* 197* 258* 198* 200*  BUN 49* 56* 55* 53* 59* 59* 59*  CREATININE 4.43* 4.04* 3.45* 3.05* 2.96* 3.05* 3.10*  ALBUMIN  2.2* 2.1* 2.5* 2.3* 2.4* 2.4* 2.7*  CALCIUM  8.1* 8.1* 8.6* 8.3* 8.5* 8.6* 9.0  PHOS 3.9 4.0 2.9 2.8 2.5 3.9 4.5   Liver Function Tests: Recent Labs  Lab 01/17/24 0351 01/18/24 0351 01/19/24 0347  ALBUMIN  2.4* 2.4* 2.7*   No results for input(s): "LIPASE", "AMYLASE" in the last 168 hours. No results for input(s): "AMMONIA" in the last 168 hours. CBC: Recent Labs  Lab 01/14/24 0354 01/15/24 0420 01/16/24 0414 01/17/24 0351 01/18/24 0351  WBC 4.6 6.9 7.5 9.4 10.2  HGB 11.2* 13.6 13.5 13.8 13.7  HCT 33.5* 40.1 39.3 41.0 40.4  MCV 86.6 85.7 85.1 86.0 85.1  PLT 217 331 370 462* 445*   Cardiac Enzymes: No results for input(s): "CKTOTAL", "CKMB", "CKMBINDEX", "TROPONINI" in the last 168 hours. CBG: Recent Labs  Lab 01/18/24 1234 01/18/24 1706 01/18/24 2129 01/19/24 0601 01/19/24 1115   GLUCAP 223* 259* 304* 147* 154*    Iron Studies: No results for input(s): "IRON", "TIBC", "TRANSFERRIN", "FERRITIN" in the last 72 hours. Studies/Results: No results found.  apixaban   5 mg Oral BID   carvedilol   12.5 mg Oral BID WC   fluticasone   2 spray Each Nare Daily   guaiFENesin   1,200 mg Oral BID   insulin  aspart  0-9 Units Subcutaneous TID WC   insulin  aspart  6 Units Subcutaneous TID WC   insulin  glargine-yfgn  30 Units Subcutaneous BID   loperamide   2 mg Oral Once   loratadine   10 mg Oral Daily   pantoprazole   40 mg Oral BID   sodium bicarbonate   650 mg Oral BID   sodium chloride  flush  3 mL Intravenous Q12H   sucralfate   1 g Oral TID WC & HS   thiamine   100 mg Oral Daily    BMET    Component Value Date/Time   NA 137 01/19/2024 0347   NA 139 03/29/2022 1059   K 3.6 01/19/2024 0347   CL 99 01/19/2024 0347   CO2 24 01/19/2024 0347   GLUCOSE 200 (H) 01/19/2024 0347   BUN 59 (H) 01/19/2024 0347   BUN 29 (H) 03/29/2022 1059   CREATININE 3.10 (H) 01/19/2024 4098  CALCIUM  9.0 01/19/2024 0347   GFRNONAA 24 (L) 01/19/2024 0347   GFRAA  10/26/2010 1143    >60        The eGFR has been calculated using the MDRD equation. This calculation has not been validated in all clinical situations. eGFR's persistently <60 mL/min signify possible Chronic Kidney Disease.   CBC    Component Value Date/Time   WBC 10.2 01/18/2024 0351   RBC 4.75 01/18/2024 0351   HGB 13.7 01/18/2024 0351   HCT 40.4 01/18/2024 0351   PLT 445 (H) 01/18/2024 0351   MCV 85.1 01/18/2024 0351   MCH 28.8 01/18/2024 0351   MCHC 33.9 01/18/2024 0351   RDW 12.5 01/18/2024 0351   LYMPHSABS 0.8 01/09/2024 0534   MONOABS 0.8 01/09/2024 0534   EOSABS 0.0 01/09/2024 0534   BASOSABS 0.0 01/09/2024 0534    Assessment/Plan:   AKI/CKD stage IV - in setting of PE and volume overload.  Underlying CKD likely due to longstanding, poorly controlled DM.  BUN/Cr slowly improving towards his baseline.  Stable  for discharge from renal standpoint.  Would change diuretics to torsemide  100 mg bid.  Will need to f/u with our office after his discharge.  Acute PE - no evidence of DVT by dopplers.  Currently on Eliquis  but can't afford as an outpatient.  Plan per primary svc Acute on chronic HFpEF - responding to IV lasix  but still with significant lower extremity edema.  Unna boots placed this morning. Multifocal pneumonia - abx per primary svc DM type 2 - poorly controlled.  HTN/volume - as above.  Responding to lasix .  Will increase carvedilol  to 12.5 mg and follow bp Metabolic acidosis - on sodium bicarb Hypokalemia - cont with supplementation  Benjamin Brands, MD [facility] [phone]

## 2024-01-19 NOTE — Progress Notes (Signed)
 DISCHARGE NOTE HOME Leroy TOWRY to be discharged Home per MD order. Discussed prescriptions and follow up appointments with the patient. Prescriptions given to patient; medication list explained in detail. Patient verbalized understanding.  Skin clean, dry and intact without evidence of skin break down, no evidence of skin tears noted. IV catheter discontinued intact. Site without signs and symptoms of complications. Dressing and pressure applied. Pt denies pain at the site currently. No complaints noted.  Patient free of lines, drains, and wounds.   An After Visit Summary (AVS) was printed and given to the patient. Patient escorted via wheelchair, and discharged home via private auto.  Peyton SHAUNNA Pepper, RN

## 2024-01-20 ENCOUNTER — Other Ambulatory Visit (HOSPITAL_COMMUNITY): Payer: Self-pay

## 2024-01-22 ENCOUNTER — Other Ambulatory Visit (HOSPITAL_COMMUNITY): Payer: Self-pay

## 2024-01-22 ENCOUNTER — Telehealth: Payer: Self-pay | Admitting: *Deleted

## 2024-01-22 NOTE — Transitions of Care (Post Inpatient/ED Visit) (Signed)
   01/22/2024  Name: Leroy Simmons MRN: [id number] DOB: 03-Mar-1971  Today's TOC FU Call Status: Today's TOC FU Call Status:: Unsuccessful Call (1st Attempt) Unsuccessful Call (1st Attempt) Date: 01/22/24  Attempted to reach the patient regarding the most recent Inpatient/ED visit.  Follow Up Plan: Additional outreach attempts will be made to reach the patient to complete the Transitions of Care (Post Inpatient/ED visit) call.   Arna Better RN, BSN [place]  [facility] RN Care Manager [phone]

## 2024-01-23 ENCOUNTER — Telehealth: Payer: Self-pay

## 2024-01-23 NOTE — Transitions of Care (Post Inpatient/ED Visit) (Signed)
   01/23/2024  Name: Leroy Simmons MRN: [id number] DOB: [birth year]  Today's TOC FU Call Status: Today's TOC FU Call Status:: Unsuccessful Call (2nd Attempt) Unsuccessful Call (2nd Attempt) Date: 01/23/24  Attempted to reach the patient regarding the most recent Inpatient/ED visit.  Follow Up Plan: Additional outreach attempts will be made to reach the patient to complete the Transitions of Care (Post Inpatient/ED visit) call.   Dorthey Depace J. Mahki Spikes RN, MSN [facility] RN Care Manager Direct Dial : [phone]  Fax: [phone] Website: [email or web address]

## 2024-01-24 ENCOUNTER — Telehealth: Payer: Self-pay | Admitting: *Deleted

## 2024-01-24 NOTE — Transitions of Care (Post Inpatient/ED Visit) (Signed)
   01/24/2024  Name: FENDER HERDER MRN: [id number] DOB: 03-06-1971  Today's TOC FU Call Status: Today's TOC FU Call Status:: Unsuccessful Call (3rd Attempt) Unsuccessful Call (3rd Attempt) Date: 01/24/24  Attempted to reach the patient regarding the most recent Inpatient/ED visit.  Follow Up Plan: No further outreach attempts will be made at this time. We have been unable to contact the patient.  Arna Better RN, BSN [place]  [facility] RN Care Manager [phone]

## 2024-01-31 ENCOUNTER — Telehealth (INDEPENDENT_AMBULATORY_CARE_PROVIDER_SITE_OTHER): Payer: Self-pay | Admitting: Primary Care

## 2024-01-31 NOTE — Telephone Encounter (Signed)
[place] Squibb would you be able to assist with this

## 2024-01-31 NOTE — Telephone Encounter (Signed)
 Copied from CRM [id number]. Topic: Clinical - Home Health Verbal Orders >> Jan 31, 2024 11:47 AM Opal Bill wrote: Caller/Agency: Christy/[facility] Callback Number: [phone] Service Requested: Social Worker and additional resources Frequency: n/a Any new concerns about the patient? Yes. Christy called to set up appt yesterday to see patient today and he said that he was getting evicted and the sheriff was going to put a pad lock on the home today. Patient is now not answering, calls are all going to voicemail. He did say he was going to be staying with friends, but would not disclose where. Kathaleen Pale feels the doctor should be made aware and she will be contacting [facility]. Please follow up.

## 2024-02-01 NOTE — Telephone Encounter (Signed)
 I spoke to [place], RN/ [facility].  She explained that she called the patient to schedule a home visit and he told her that he was being evicted and the sheriff was coming to walk him out of the house.  She said he told her he was going to stay with friends but did not tell her where he is going.  She has tried to reach him again and has been unsuccessful.  She stated she called [facility] yesterday and they never called her back so she will reach out to them again. I told her that I will try to reach the patient and will provide her with any new information I obtain,  I tried to reach the patient and had to leave a message requesting a call back.

## 2024-02-01 NOTE — Telephone Encounter (Signed)
 Thank you [place] Squibb!

## 2024-02-06 NOTE — Telephone Encounter (Signed)
I tried to reach the patient again and had to leave a message requesting a call back

## 2024-02-12 NOTE — Telephone Encounter (Signed)
 I spoke to the patient and he explained that he and his friend were evicted from their home 01/31/2024.  His is currently staying with a friend in [place]. I have updated Epic with his current address. He confirmed he has an income but has not started to search for an apartment. I explained that the SW with [facility] tried to reach him but was not successful.  He said that I could call her and update her with his housing situation.  I told him that I will call her but I am not sure that the referral for their services is still open.  I also offered to refer him to Audry Leavell, RN/ [facility] to see if she has any options for housing for him and he was in agreement to placing that referral.  Raven - would you be able to call him and discuss any options for housing that may be available to him? thanks

## 2024-02-15 ENCOUNTER — Other Ambulatory Visit (HOSPITAL_COMMUNITY): Payer: Self-pay

## 2024-02-15 MED ORDER — LATANOPROST 0.005 % OP SOLN
1.0000 [drp] | Freq: Every evening | OPHTHALMIC | 7 refills | Status: AC
  Filled 2024-02-16 (×2): qty 2.5, 50d supply, fill #0
  Filled 2024-04-15: qty 2.5, 50d supply, fill #1
  Filled 2024-08-21: qty 2.5, 50d supply, fill #2

## 2024-02-16 ENCOUNTER — Other Ambulatory Visit (INDEPENDENT_AMBULATORY_CARE_PROVIDER_SITE_OTHER): Payer: Self-pay | Admitting: Primary Care

## 2024-02-16 ENCOUNTER — Other Ambulatory Visit (HOSPITAL_COMMUNITY): Payer: Self-pay

## 2024-02-16 ENCOUNTER — Other Ambulatory Visit (HOSPITAL_BASED_OUTPATIENT_CLINIC_OR_DEPARTMENT_OTHER): Payer: Self-pay

## 2024-02-19 ENCOUNTER — Other Ambulatory Visit: Payer: Self-pay

## 2024-02-19 ENCOUNTER — Other Ambulatory Visit (HOSPITAL_COMMUNITY): Payer: Self-pay | Admitting: Student

## 2024-02-19 ENCOUNTER — Other Ambulatory Visit (HOSPITAL_COMMUNITY): Payer: Self-pay

## 2024-02-20 ENCOUNTER — Other Ambulatory Visit (HOSPITAL_COMMUNITY): Payer: Self-pay

## 2024-02-20 ENCOUNTER — Encounter (HOSPITAL_COMMUNITY): Payer: Self-pay

## 2024-02-20 NOTE — Telephone Encounter (Signed)
 Thank you so much Raven!

## 2024-02-22 NOTE — Telephone Encounter (Signed)
 FYI-  I spoke to Christy/ [facility] and told her that I was able to speak to the patient and obtain his current address.  She said that they have closed the referral and if the provider wants him to be seen, a new referral would have to be placed.  Before a referral can be placed, he would need a visit with PCP as it has been > 90 days since he was seen by his PCP>

## 2024-02-27 ENCOUNTER — Other Ambulatory Visit: Payer: Self-pay

## 2024-02-27 ENCOUNTER — Other Ambulatory Visit (HOSPITAL_COMMUNITY): Payer: Self-pay

## 2024-02-27 MED ORDER — DORZOLAMIDE HCL-TIMOLOL MAL 2-0.5 % OP SOLN
1.0000 [drp] | Freq: Two times a day (BID) | OPHTHALMIC | 11 refills | Status: AC
  Filled 2024-02-27: qty 10, 50d supply, fill #0
  Filled 2024-04-15: qty 10, 50d supply, fill #1
  Filled 2024-08-21: qty 10, 50d supply, fill #2

## 2024-02-27 MED ORDER — LATANOPROST 0.005 % OP SOLN
1.0000 [drp] | Freq: Every day | OPHTHALMIC | 11 refills | Status: DC
  Filled 2024-02-27: qty 7.5, 75d supply, fill #0

## 2024-03-01 ENCOUNTER — Encounter (HOSPITAL_COMMUNITY): Payer: Self-pay

## 2024-03-01 ENCOUNTER — Other Ambulatory Visit (HOSPITAL_COMMUNITY): Payer: Self-pay

## 2024-03-01 ENCOUNTER — Other Ambulatory Visit (INDEPENDENT_AMBULATORY_CARE_PROVIDER_SITE_OTHER): Payer: Self-pay | Admitting: Primary Care

## 2024-03-12 ENCOUNTER — Other Ambulatory Visit (HOSPITAL_COMMUNITY): Payer: Self-pay

## 2024-03-15 ENCOUNTER — Other Ambulatory Visit (HOSPITAL_COMMUNITY): Payer: Self-pay

## 2024-04-13 DIAGNOSIS — Z419 Encounter for procedure for purposes other than remedying health state, unspecified: Secondary | ICD-10-CM | POA: Diagnosis not present

## 2024-04-15 ENCOUNTER — Other Ambulatory Visit (HOSPITAL_COMMUNITY): Payer: Self-pay

## 2024-04-15 ENCOUNTER — Other Ambulatory Visit: Payer: Self-pay

## 2024-04-18 DIAGNOSIS — H40053 Ocular hypertension, bilateral: Secondary | ICD-10-CM | POA: Diagnosis not present

## 2024-04-18 DIAGNOSIS — H3581 Retinal edema: Secondary | ICD-10-CM | POA: Diagnosis not present

## 2024-04-18 DIAGNOSIS — H2513 Age-related nuclear cataract, bilateral: Secondary | ICD-10-CM | POA: Diagnosis not present

## 2024-04-18 DIAGNOSIS — E113553 Type 2 diabetes mellitus with stable proliferative diabetic retinopathy, bilateral: Secondary | ICD-10-CM | POA: Diagnosis not present

## 2024-04-18 DIAGNOSIS — H35033 Hypertensive retinopathy, bilateral: Secondary | ICD-10-CM | POA: Diagnosis not present

## 2024-04-24 ENCOUNTER — Telehealth (INDEPENDENT_AMBULATORY_CARE_PROVIDER_SITE_OTHER): Payer: Self-pay | Admitting: Primary Care

## 2024-04-24 NOTE — Telephone Encounter (Signed)
 Called pt to confirm appt. Pt will be present.

## 2024-04-25 ENCOUNTER — Ambulatory Visit (INDEPENDENT_AMBULATORY_CARE_PROVIDER_SITE_OTHER): Admitting: Primary Care

## 2024-04-25 ENCOUNTER — Other Ambulatory Visit (HOSPITAL_COMMUNITY): Payer: Self-pay

## 2024-04-25 ENCOUNTER — Encounter (INDEPENDENT_AMBULATORY_CARE_PROVIDER_SITE_OTHER): Payer: Self-pay | Admitting: Primary Care

## 2024-04-25 VITALS — BP 163/102 | HR 90 | Resp 16 | Ht 68.0 in | Wt 250.6 lb

## 2024-04-25 DIAGNOSIS — Z1159 Encounter for screening for other viral diseases: Secondary | ICD-10-CM | POA: Diagnosis not present

## 2024-04-25 DIAGNOSIS — Z760 Encounter for issue of repeat prescription: Secondary | ICD-10-CM | POA: Diagnosis not present

## 2024-04-25 DIAGNOSIS — E119 Type 2 diabetes mellitus without complications: Secondary | ICD-10-CM

## 2024-04-25 DIAGNOSIS — Z1211 Encounter for screening for malignant neoplasm of colon: Secondary | ICD-10-CM

## 2024-04-25 DIAGNOSIS — Z0184 Encounter for antibody response examination: Secondary | ICD-10-CM

## 2024-04-25 DIAGNOSIS — I10 Essential (primary) hypertension: Secondary | ICD-10-CM | POA: Diagnosis not present

## 2024-04-25 DIAGNOSIS — Z794 Long term (current) use of insulin: Secondary | ICD-10-CM

## 2024-04-25 DIAGNOSIS — L602 Onychogryphosis: Secondary | ICD-10-CM

## 2024-04-25 LAB — POCT GLYCOSYLATED HEMOGLOBIN (HGB A1C): HbA1c, POC (controlled diabetic range): 10.3 % — AB (ref 0.0–7.0)

## 2024-04-25 MED ORDER — INSULIN GLARGINE SOLOSTAR 100 UNIT/ML ~~LOC~~ SOPN
45.0000 [IU] | PEN_INJECTOR | Freq: Two times a day (BID) | SUBCUTANEOUS | 1 refills | Status: DC
  Filled 2024-04-25: qty 15, 17d supply, fill #0
  Filled 2024-05-16: qty 15, 17d supply, fill #1

## 2024-04-25 NOTE — Telephone Encounter (Signed)
 Copied from CRM [id number]. Topic: General - Transportation >> Apr 25, 2024  1:50 PM Leroy Simmons wrote:  Reason for CRM: patient called stated he is running late due to the bus schedule. Advised him of the 10 minute late policy and he may be asked to reschedule

## 2024-04-25 NOTE — Progress Notes (Signed)
 Subjective:  Patient ID: Leroy Simmons, male    DOB: Jun 18, 1971  Age: 53 y.o. MRN: [id number]  CC: Hypertension (Pt hasn't taken any medications today ) and Diabetes   Leroy Simmons presents for Follow-up of diabetes. Patient does not check blood sugar at home.  Patient is followed by cardiology.  Blood pressure is elevated today Leroy Simmons has not taking his medicine.  Patient does have medicine in his possession Leroy Simmons was given a cup of ice water and took his medicine during this appointment.  States Leroy Simmons does not miss his blood pressure medicines. Hypertension This is a chronic problem. The current episode started more than 1 year ago. The problem has been waxing and waning since onset. The problem is uncontrolled. Associated symptoms include anxiety, chest pain and headaches. Agents associated with hypertension include NSAIDs. Risk factors for coronary artery disease include diabetes mellitus, dyslipidemia, obesity, male gender and stress. Past treatments include beta blockers and diuretics. The current treatment provides no improvement. Compliance problems include exercise.   Diabetes Hypoglycemia symptoms include headaches. Associated symptoms include chest pain.    Compliant with meds - No Checking CBGs? No  Fasting avg -   Postprandial average -  Exercising regularly? - Yes Watching carbohydrate intake? - No Neuropathy ? - Yes Hypoglycemic events - No  - Recovers with :   Pertinent ROS:  Polyuria - Yes Polydipsia - Yes Vision problems - Yes  Medications as noted below. Taking them regularly without complication/adverse reaction being reported today.   History Leroy Simmons has a past medical history of Acute gangrenous appendicitis (09/25/2022), Anxiety, CHF (congestive heart failure) (HCC), Coronary artery disease, Depression, Diabetes mellitus without complication (HCC), and Hypertension.   Leroy Simmons has a past surgical history that includes LEFT HEART CATH AND CORONARY ANGIOGRAPHY (N/A,  03/21/2022); laparoscopic appendectomy (N/A, 09/26/2022); and Incision and drainage abscess (N/A, 09/15/2023).   His family history includes Heart failure in his mother.Leroy Simmons reports that Leroy Simmons has never smoked. Leroy Simmons has never used smokeless tobacco. Leroy Simmons reports current alcohol use. Leroy Simmons reports that Leroy Simmons does not use drugs.  Current Outpatient Medications on File Prior to Visit  Medication Sig Dispense Refill   albuterol  (VENTOLIN  HFA) 108 (90 Base) MCG/ACT inhaler Inhale 2 puffs into the lungs every 6 (six) hours as needed for wheezing or shortness of breath. 8 g 2   apixaban  (ELIQUIS ) 5 MG TABS tablet Take 1 tablet (5 mg total) by mouth 2 (two) times daily. 180 tablet 1   blood glucose meter kit and supplies Dispense based on patient and insurance preference. Use up to four times daily as directed. (FOR ICD-10 E10.9, E11.9). 1 each 0   carvedilol  (COREG ) 12.5 MG tablet Take 1 tablet (12.5 mg total) by mouth 2 (two) times daily with a meal. 180 tablet 1   dorzolamide -timolol  (COSOPT ) 2-0.5 % ophthalmic solution Place 1 drop into both eyes 2 (two) times daily. 10 mL 11   insulin  lispro (HUMALOG ) 100 UNIT/ML KwikPen Inject 5 Units into the skin 3 (three) times daily before meals. 15 mL 11   Insulin  Pen Needle 32G X 4 MM MISC Use with insulin  100 each 0   latanoprost  (XALATAN ) 0.005 % ophthalmic solution Administer 1 drop into the left eye nightly. 2.5 mL 7   latanoprost  (XALATAN ) 0.005 % ophthalmic solution Place 1 drop into both eyes at bedtime. 7.5 mL 11   pantoprazole  (PROTONIX ) 40 MG tablet Take 1 tablet (40 mg total) by mouth daily. 30 tablet 0   polyethylene glycol  powder (GLYCOLAX /MIRALAX ) 17 GM/SCOOP powder Take 17 g by mouth 2 (two) times daily. (Patient taking differently: Take 17 g by mouth daily as needed for severe constipation.) 238 g 0   senna-docusate (SENOKOT-S) 8.6-50 MG tablet Take 1 tablet by mouth 2 (two) times daily. (Patient taking differently: Take 1 tablet by mouth daily as needed for  moderate constipation.) 60 tablet 0   torsemide  (DEMADEX ) 100 MG tablet Take 1 tablet (100 mg total) by mouth 2 (two) times daily. 180 tablet 0   No current facility-administered medications on file prior to visit.    Review of Systems  Cardiovascular:  Positive for chest pain.  Neurological:  Positive for headaches.   Comprehensive ROS Pertinent positive and negative noted in HPI   Objective:  BP (!) 177/108 (BP Location: Left Arm, Patient Position: Sitting, Cuff Size: Normal)   Pulse 90   Resp 16   Ht 5' 8 (1.727 m)   Wt 250 lb 9.6 oz (113.7 kg)   SpO2 97%   BMI 38.10 kg/m   BP Readings from Last 3 Encounters:  04/25/24 (!) 177/108  01/19/24 (!) 143/89  09/18/23 (!) 150/95    Wt Readings from Last 3 Encounters:  04/25/24 250 lb 9.6 oz (113.7 kg)  01/19/24 233 lb 9.6 oz (106 kg)  09/16/23 253 lb (114.8 kg)    Physical Exam Vitals reviewed.  Constitutional:      Appearance: Leroy Simmons is obese.  HENT:     Head: Normocephalic.     Right Ear: Tympanic membrane, ear canal and external ear normal.     Left Ear: Tympanic membrane, ear canal and external ear normal.     Nose: Nose normal.  Eyes:     Extraocular Movements: Extraocular movements intact.  Cardiovascular:     Rate and Rhythm: Normal rate and regular rhythm.  Pulmonary:     Effort: Pulmonary effort is normal.     Breath sounds: Normal breath sounds.  Abdominal:     General: Bowel sounds are normal. There is distension.     Palpations: Abdomen is soft.  Musculoskeletal:        General: Normal range of motion.     Cervical back: Normal range of motion and neck supple.     Right lower leg: Edema present.     Left lower leg: Edema present.     Comments: 2+  Skin:    General: Skin is warm and dry.  Neurological:     Mental Status: Leroy Simmons is oriented to person, place, and time.  Psychiatric:        Mood and Affect: Mood normal.        Behavior: Behavior normal.        Thought Content: Thought content normal.         Judgment: Judgment normal.    Body mass index is 38.1 kg/m.  Lab Results  Component Value Date   HGBA1C 10.3 (A) 04/25/2024   HGBA1C 9.3 (H) 01/11/2024   HGBA1C 10.8 (H) 09/09/2023    Lab Results  Component Value Date   WBC 10.2 01/18/2024   HGB 13.7 01/18/2024   HCT 40.4 01/18/2024   PLT 445 (H) 01/18/2024   GLUCOSE 200 (H) 01/19/2024   CHOL 218 (H) 01/19/2023   TRIG 228 (H) 01/19/2023   HDL 47 01/19/2023   LDLCALC 125 (H) 01/19/2023   ALT 28 01/10/2024   AST 39 01/10/2024   NA 137 01/19/2024   K 3.6 01/19/2024   CL 99 01/19/2024  CREATININE 3.10 (H) 01/19/2024   BUN 59 (H) 01/19/2024   CO2 24 01/19/2024   TSH 1.787 09/21/2009   INR 1.1 01/19/2023   HGBA1C 10.3 (A) 04/25/2024   MICROALBUR 2.91 (H) 09/21/2009    Title   Diabetic Foot Exam - detailed Date & Time: 04/25/2024  3:28 PM Diabetic Foot exam was performed with the following findings: Yes  Visual Foot Exam completed.: Yes  Is there a history of foot ulcer?: No Is there a foot ulcer now?: No Is there swelling?: Yes Is there elevated skin temperature?: No Is there abnormal foot shape?: No Is there a claw toe deformity?: No Are the toenails long?: Yes Are the toenails thick?: Yes Are the toenails ingrown?: No Is the skin thin, fragile, shiny and hairless?: No Normal Range of Motion?: Yes Is there foot or ankle muscle weakness?: No Do you have pain in calf while walking?: No Are the shoes appropriate in style and fit?: Yes Can the patient see the bottom of their feet?: No (Comment: TAUGHT TO USE A MIRROR) Pulse Foot Exam completed.: Yes   Right Dorsalis Pedis: Diminished Left Dorsalis Pedis: Diminished     Sensory Foot Exam Completed.: Yes Semmes-Weinstein Monofilament Test + means has sensation and - means no sensation  R Foot Test Control: Pos L Foot Test Control: Pos   R Site 1-Great Toe: Pos L Site 1-Great Toe: Pos   R Site 4: Pos L Site 4: Pos   R site 5: Pos L Site 5: Pos   R Site 6: Pos L Site 6: Pos     Image components are not supported.   Image components are not supported. Image components are not supported.  Tuning [place] Right vibratory: present Left vibratory: present  Comments      Assessment & Plan:   Devlyn was seen today for hypertension and diabetes.  Diagnoses and all orders for this visit:  Type 2 diabetes mellitus without complication, without long-term current use of insulin  (HCC)  D/w Complications from uncontrolled diabetes -diabetic retinopathy leading to blindness, diabetic nephropathy leading to dialysis, decrease in circulation decrease in sores or wound healing which may lead to amputations and increase of heart attack and stroke . Effects on the liver- non-alcoholic fatty liver disease (NAFLD), which can progress to non-alcoholic steatohepatitis (NASH), cirrhosis, liver failure, and even liver cancer , lead to fat accumulation in the liver, causing NAFLD, and if left untreated, can lead to more severe liver damage.  -     POCT glycosylated hemoglobin (Hb A1C) -     Microalbumin / creatinine urine ratio -     CBC with Differential/Platelet -     CMP14+EGFR -     Lipid panel  Essential hypertension FOLLOWED BY CARDIOLOGY SEE HPI -     CMP14+EGFR  Screening for viral disease -     HCV Ab w Reflex to Quant PCR  Screening for colon cancer -     Ambulatory referral to Gastroenterology  Immunity status testing -     Hepatitis B surface antibody,qualitative  Other orders/ Medication refill  -     Insulin  Glargine Solostar (LANTUS ) 100 UNIT/ML Solostar Pen; Inject 45 Units into the skin 2 (two) times daily.    Follow-up:  Return in about 3 months (around 07/26/2024).  The above assessment and management plan was discussed with the patient. The patient verbalized understanding of and has agreed to the management plan. Patient is aware to call the clinic if symptoms fail to  improve or worsen. Patient is aware when to return to  the clinic for a follow-up visit. Patient educated on when it is appropriate to go to the emergency department.   Rosaline Bohr, NP-C

## 2024-04-25 NOTE — Telephone Encounter (Signed)
 Redirecting to the correct office.

## 2024-04-26 LAB — CMP14+EGFR
ALT: 25 IU/L (ref 0–44)
AST: 22 IU/L (ref 0–40)
Albumin: 3.7 g/dL — ABNORMAL LOW (ref 4.1–5.1)
Alkaline Phosphatase: 158 IU/L — ABNORMAL HIGH (ref 44–121)
BUN/Creatinine Ratio: 15 (ref 9–20)
BUN: 45 mg/dL — ABNORMAL HIGH (ref 6–24)
Bilirubin Total: 0.2 mg/dL (ref 0.0–1.2)
CO2: 21 mmol/L (ref 20–29)
Calcium: 8.7 mg/dL (ref 8.7–10.2)
Chloride: 100 mmol/L (ref 96–106)
Creatinine, Ser: 3.02 mg/dL — ABNORMAL HIGH (ref 0.76–1.27)
Globulin, Total: 2.7 g/dL (ref 1.5–4.5)
Glucose: 404 mg/dL — ABNORMAL HIGH (ref 70–99)
Potassium: 4.1 mmol/L (ref 3.5–5.2)
Sodium: 137 mmol/L (ref 134–144)
Total Protein: 6.4 g/dL (ref 6.0–8.5)
eGFR: 24 mL/min/1.73 — ABNORMAL LOW (ref >59)

## 2024-04-26 LAB — LIPID PANEL
Chol/HDL Ratio: 6.7 ratio — ABNORMAL HIGH (ref 0.0–5.0)
Cholesterol, Total: 248 mg/dL — ABNORMAL HIGH (ref 100–199)
HDL: 37 mg/dL — ABNORMAL LOW (ref >39)
LDL Chol Calc (NIH): 126 mg/dL — ABNORMAL HIGH (ref 0–99)
Triglycerides: 475 mg/dL — ABNORMAL HIGH (ref 0–149)
VLDL Cholesterol Cal: 85 mg/dL — ABNORMAL HIGH (ref 5–40)

## 2024-04-26 LAB — CBC WITH DIFFERENTIAL/PLATELET
Basophils Absolute: 0 x10E3/uL (ref 0.0–0.2)
Basos: 1 %
EOS (ABSOLUTE): 0.2 x10E3/uL (ref 0.0–0.4)
Eos: 3 %
Hematocrit: 41.3 % (ref 37.5–51.0)
Hemoglobin: 13.2 g/dL (ref 13.0–17.7)
Immature Grans (Abs): 0 x10E3/uL (ref 0.0–0.1)
Immature Granulocytes: 0 %
Lymphocytes Absolute: 2.1 x10E3/uL (ref 0.7–3.1)
Lymphs: 32 %
MCH: 30 pg (ref 26.6–33.0)
MCHC: 32 g/dL (ref 31.5–35.7)
MCV: 94 fL (ref 79–97)
Monocytes Absolute: 0.5 x10E3/uL (ref 0.1–0.9)
Monocytes: 8 %
Neutrophils Absolute: 3.8 x10E3/uL (ref 1.4–7.0)
Neutrophils: 56 %
Platelets: 259 x10E3/uL (ref 150–450)
RBC: 4.4 x10E6/uL (ref 4.14–5.80)
RDW: 13.3 % (ref 11.6–15.4)
WBC: 6.6 x10E3/uL (ref 3.4–10.8)

## 2024-04-26 LAB — HEPATITIS B SURFACE ANTIBODY,QUALITATIVE: Hep B Surface Ab, Qual: NONREACTIVE

## 2024-04-26 LAB — MICROALBUMIN / CREATININE URINE RATIO
Creatinine, Urine: 64 mg/dL
Microalb/Creat Ratio: 5697 mg/g{creat} — ABNORMAL HIGH (ref 0–29)
Microalbumin, Urine: 3646.2 ug/mL

## 2024-04-26 LAB — HCV AB W REFLEX TO QUANT PCR: HCV Ab: NONREACTIVE

## 2024-04-26 LAB — HCV INTERPRETATION

## 2024-04-27 ENCOUNTER — Other Ambulatory Visit: Payer: Self-pay | Admitting: Primary Care

## 2024-04-27 ENCOUNTER — Ambulatory Visit: Payer: Self-pay | Admitting: Primary Care

## 2024-04-27 DIAGNOSIS — E782 Mixed hyperlipidemia: Secondary | ICD-10-CM

## 2024-04-27 MED ORDER — ROSUVASTATIN CALCIUM 40 MG PO TABS
40.0000 mg | ORAL_TABLET | Freq: Every day | ORAL | 1 refills | Status: DC
  Filled 2024-04-27: qty 90, 90d supply, fill #0

## 2024-04-29 ENCOUNTER — Other Ambulatory Visit (HOSPITAL_COMMUNITY): Payer: Self-pay

## 2024-05-13 ENCOUNTER — Other Ambulatory Visit: Payer: Self-pay

## 2024-05-14 DIAGNOSIS — Z419 Encounter for procedure for purposes other than remedying health state, unspecified: Secondary | ICD-10-CM | POA: Diagnosis not present

## 2024-05-16 ENCOUNTER — Other Ambulatory Visit (HOSPITAL_COMMUNITY): Payer: Self-pay

## 2024-05-22 DIAGNOSIS — H2513 Age-related nuclear cataract, bilateral: Secondary | ICD-10-CM | POA: Diagnosis not present

## 2024-05-24 ENCOUNTER — Telehealth: Payer: Self-pay | Admitting: Primary Care

## 2024-05-24 NOTE — Telephone Encounter (Signed)
 Confirmed appt for 8/25

## 2024-05-27 ENCOUNTER — Encounter: Payer: Self-pay | Admitting: Pharmacist

## 2024-05-27 ENCOUNTER — Ambulatory Visit: Attending: Primary Care | Admitting: Pharmacist

## 2024-05-27 ENCOUNTER — Other Ambulatory Visit: Payer: Self-pay

## 2024-05-27 VITALS — BP 191/124 | HR 92

## 2024-05-27 DIAGNOSIS — I5032 Chronic diastolic (congestive) heart failure: Secondary | ICD-10-CM

## 2024-05-27 DIAGNOSIS — I2510 Atherosclerotic heart disease of native coronary artery without angina pectoris: Secondary | ICD-10-CM

## 2024-05-27 DIAGNOSIS — Z794 Long term (current) use of insulin: Secondary | ICD-10-CM | POA: Diagnosis not present

## 2024-05-27 DIAGNOSIS — E782 Mixed hyperlipidemia: Secondary | ICD-10-CM | POA: Diagnosis not present

## 2024-05-27 DIAGNOSIS — E1165 Type 2 diabetes mellitus with hyperglycemia: Secondary | ICD-10-CM | POA: Diagnosis not present

## 2024-05-27 MED ORDER — APIXABAN 5 MG PO TABS
5.0000 mg | ORAL_TABLET | Freq: Two times a day (BID) | ORAL | 1 refills | Status: AC
  Filled 2024-05-27: qty 180, 90d supply, fill #0

## 2024-05-27 MED ORDER — TOUJEO MAX SOLOSTAR 300 UNIT/ML ~~LOC~~ SOPN
52.0000 [IU] | PEN_INJECTOR | Freq: Every day | SUBCUTANEOUS | 2 refills | Status: DC
  Filled 2024-05-27: qty 6, 34d supply, fill #0
  Filled 2024-07-08: qty 6, 34d supply, fill #1

## 2024-05-27 MED ORDER — PANTOPRAZOLE SODIUM 40 MG PO TBEC
40.0000 mg | DELAYED_RELEASE_TABLET | Freq: Every day | ORAL | 1 refills | Status: AC
  Filled 2024-05-27: qty 30, 30d supply, fill #0

## 2024-05-27 MED ORDER — CARVEDILOL 12.5 MG PO TABS
12.5000 mg | ORAL_TABLET | Freq: Two times a day (BID) | ORAL | 1 refills | Status: DC
  Filled 2024-05-27: qty 180, 90d supply, fill #0

## 2024-05-27 MED ORDER — INSULIN PEN NEEDLE 32G X 4 MM MISC
3 refills | Status: AC
  Filled 2024-05-27: qty 100, 25d supply, fill #0

## 2024-05-27 MED ORDER — INSULIN LISPRO (1 UNIT DIAL) 100 UNIT/ML (KWIKPEN)
8.0000 [IU] | PEN_INJECTOR | Freq: Three times a day (TID) | SUBCUTANEOUS | 2 refills | Status: DC
  Filled 2024-05-27 (×2): qty 15, 63d supply, fill #0

## 2024-05-27 MED ORDER — ROSUVASTATIN CALCIUM 40 MG PO TABS
40.0000 mg | ORAL_TABLET | Freq: Every day | ORAL | 1 refills | Status: DC
  Filled 2024-05-27: qty 90, 90d supply, fill #0

## 2024-05-27 MED ORDER — TORSEMIDE 100 MG PO TABS
100.0000 mg | ORAL_TABLET | Freq: Two times a day (BID) | ORAL | 1 refills | Status: DC
  Filled 2024-05-27: qty 180, 90d supply, fill #0

## 2024-05-27 MED ORDER — INSULIN LISPRO (1 UNIT DIAL) 100 UNIT/ML (KWIKPEN)
12.0000 [IU] | PEN_INJECTOR | Freq: Three times a day (TID) | SUBCUTANEOUS | 2 refills | Status: DC
  Filled 2024-05-27: qty 15, 42d supply, fill #0

## 2024-05-27 MED ORDER — TOUJEO MAX SOLOSTAR 300 UNIT/ML ~~LOC~~ SOPN
60.0000 [IU] | PEN_INJECTOR | Freq: Every day | SUBCUTANEOUS | 2 refills | Status: DC
  Filled 2024-05-27: qty 6, 30d supply, fill #0

## 2024-05-27 MED ORDER — ALBUTEROL SULFATE HFA 108 (90 BASE) MCG/ACT IN AERS
2.0000 | INHALATION_SPRAY | Freq: Four times a day (QID) | RESPIRATORY_TRACT | 2 refills | Status: AC | PRN
  Filled 2024-05-27: qty 18, 25d supply, fill #0

## 2024-05-27 NOTE — Progress Notes (Signed)
 S:     No chief complaint on file.  53 y.o. male who presents for diabetes evaluation, education, and management. Patient arrives in good spirits and presents without any assistance.   Patient was referred and last seen by Primary Care Provider, Rosaline Bohr, NP, on 04/25/24. At last visit, patient's A1c was 10.3% (up from 9.3% prior). Over the last year, patient's A1c has stayed consistently high with between 9-10%.  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  Patient reports diabetes is longstanding. Today, he endorses adherence to taking medications. Presents with the chief complaint of fatigue and feeling off. He also reports shaking in his arms that feels similar to how he feels with low blood sugar. Sugar at time of visit today is 262 mg/dL. While his speech is not slurred today, he does struggle with verbal hesitancy.  Endorses not checking blood sugar since last night. His reading last night was in the 190s. He ate this morning but has not eaten since. He has not taken any medication today, including his insulin .  Family/Social History: Mother: HF Does not smoke or use smokeless tobacco. Does not drink. Does not do drugs  Current diabetes medications include: Lantus  45 units twice daily (reports taking 22 units BID), Humalog  5 units three times a day with meals Current hypertension medications include: carvedilol  12.5 mg twice daily with meals, torsemide  100 mg twice daily Current hyperlipidemia medications include: rosuvastatin  40 mg once daily  Patient reports adherence to taking all medications as prescribed.   Insurance coverage: [place]  Medicaid  Patient reports symptoms of hypoglycemic events but gives no readings.   Patient reports nocturia (nighttime urination).  Patient reports neuropathy (nerve pain). Patient reports visual changes. Patient reported self foot exams.   O:   Lab Results  Component Value Date   HGBA1C 10.3 (A) 04/25/2024   There were no vitals filed for this visit.  Lipid Panel     Component Value Date/Time   CHOL 248 (H) 04/25/2024 1607   TRIG 475 (H) 04/25/2024 1607   HDL 37 (L) 04/25/2024 1607   CHOLHDL 6.7 (H) 04/25/2024 1607   CHOLHDL 4.6 01/19/2023 0617   VLDL 46 (H) 01/19/2023 0617   LDLCALC 126 (H) 04/25/2024 1607    Clinical Atherosclerotic Cardiovascular Disease (ASCVD): Yes  The ASCVD Risk score (Arnett DK, et al., 2019) failed to calculate for the following reasons:   Risk score cannot be calculated because patient has a medical history suggesting prior/existing ASCVD   Patient is participating in a Managed Medicaid Plan: YES    A/P: Diabetes longstanding currently uncontrolled. Patient is symptomatic both from a hyper and hypoglycemic standpoint today. He is able to verbalize appropriate hypoglycemia management plan. Medication adherence appears to be okay but we will set him up with our pharmacy and optimize his insulin  regimen for better control.  -Discontinued Lantus . -Started Toujeo  Max. Take 52 units in the morning.  -Increased Humalog  to 8 units TID before meals.  -Patient educated on purpose, proper use, and potential adverse effects of Toujeo , Humalog .  -Extensively discussed pathophysiology of diabetes, recommended lifestyle interventions, dietary effects on blood sugar control.  -Counseled on s/sx of and  management of hypoglycemia.  -Next A1c anticipated 07/2024.   Written patient instructions provided. Patient verbalized understanding of treatment plan.  Total time in face to face counseling 45 minutes.    Follow-up:  Pharmacist in 1 month.  Herlene Fleeta Morris, PharmD, JAQUELINE, CPP Clinical Pharmacist [facility] [phone]

## 2024-05-28 ENCOUNTER — Other Ambulatory Visit: Payer: Self-pay

## 2024-05-29 ENCOUNTER — Other Ambulatory Visit: Payer: Self-pay

## 2024-05-30 ENCOUNTER — Other Ambulatory Visit: Payer: Self-pay

## 2024-05-30 ENCOUNTER — Ambulatory Visit: Payer: Self-pay

## 2024-05-30 ENCOUNTER — Telehealth: Payer: Self-pay

## 2024-05-30 ENCOUNTER — Telehealth (INDEPENDENT_AMBULATORY_CARE_PROVIDER_SITE_OTHER): Payer: Self-pay | Admitting: Primary Care

## 2024-05-30 DIAGNOSIS — H2513 Age-related nuclear cataract, bilateral: Secondary | ICD-10-CM | POA: Diagnosis not present

## 2024-05-30 DIAGNOSIS — Z538 Procedure and treatment not carried out for other reasons: Secondary | ICD-10-CM | POA: Diagnosis not present

## 2024-05-30 NOTE — Telephone Encounter (Signed)
 Copied from CRM #[id number]. Topic: General - Other >> May 30, 2024  1:37 PM Tobias L wrote: Reason for CRM: Patient returning call to [place] Requesting callback, [phone]  Can leave voicemail per patient.

## 2024-05-30 NOTE — Telephone Encounter (Signed)
 Received a call from Dr Marthann Bp 200/110 only taking lisinopril . Explain just saw Clinical pharmacist and medication was adjusted. Explain tell him he is going to have a stroke or heart attack  or both. Dr agreed and told him the same thing and no surgery

## 2024-05-30 NOTE — Telephone Encounter (Signed)
 Left message on voicemail to return call.

## 2024-05-30 NOTE — Telephone Encounter (Signed)
 FYI Only or Action Required?: Action required by provider: update on patient condition.  Patient was last seen in primary care on 04/25/2024 by Celestia Rosaline SQUIBB, NP.  Called Nurse Triage reporting Hypertension.  Symptoms began several weeks ago.  Interventions attempted: Prescription medications: carvedilol .  Symptoms are: headache yesterday, hypertension, slurred speech/change in speech x 1 week, intermittent SOB x few weeks gradually worsening.  Triage Disposition: Go to ED Now (Notify PCP)  Patient/caregiver understands and will follow disposition?: Unsure              Copied from CRM [id number]. Topic: Clinical - Red Word Triage >> May 30, 2024 11:41 AM Leroy Simmons wrote: Red Word that prompted transfer to Nurse Triage: blood pressure 190/117 Reason for Disposition  [1] Systolic BP >= 160 OR Diastolic >= 100 AND [2] cardiac (e.g., breathing difficulty, chest pain) or neurologic symptoms (e.g., new-onset blurred or double vision, unsteady gait)  Answer Assessment - Initial Assessment Questions Patient states he saw Dr Herlene who told him it likely isn't a stroke, advised that it could still be emergent or a stroke slowly developing and best advice is to go to ED. Patient states he will consider it. Called [facility] and notified staff member.  1. BLOOD PRESSURE: What is your blood pressure? Did you take at least two measurements 5 minutes apart?     190/117.  2. ONSET: When did you take your blood pressure?     This morning in the past hour.  3. HOW: How did you take your blood pressure? (e.g., automatic home BP monitor, visiting nurse)     Taken at [facility], automatic BP monitor.  4. HISTORY: Do you have a history of high blood pressure?     Yes.  5. MEDICINES: Are you taking any medicines for blood pressure? Have you missed any doses recently?     Carvedilol , no missed doses recently.  6. OTHER SYMPTOMS: Do you have any symptoms? (e.g., blurred vision,  chest pain, difficulty breathing, headache, weakness)     Intermittent shortness of breath x few weeks, headache yesterday (resolved), bilateral feet neuropathy, stumbling over his words and slurring speech x 1 week . Denies chest pain, unilateral numbness or weakness.  7. PREGNANCY: Is there any chance you are pregnant? When was your last menstrual period?     N/A.  Protocols used: Blood Pressure - High-A-AH

## 2024-05-30 NOTE — Telephone Encounter (Signed)
 Copied from CRM #[id number]. Topic: Clinical - Medication Question >> May 30, 2024 11:39 AM Delon DASEN wrote: Reason for CRM: Still having issues with blood pressure, was unable to have eye surgery due to the bp being too high, eye doctor suggesting a beta blocker to be added to existing meds- [phone]

## 2024-05-31 ENCOUNTER — Other Ambulatory Visit (INDEPENDENT_AMBULATORY_CARE_PROVIDER_SITE_OTHER): Payer: Self-pay | Admitting: *Deleted

## 2024-05-31 ENCOUNTER — Other Ambulatory Visit: Payer: Self-pay

## 2024-05-31 ENCOUNTER — Other Ambulatory Visit (INDEPENDENT_AMBULATORY_CARE_PROVIDER_SITE_OTHER): Payer: Self-pay | Admitting: Primary Care

## 2024-05-31 ENCOUNTER — Other Ambulatory Visit (HOSPITAL_COMMUNITY): Payer: Self-pay

## 2024-05-31 DIAGNOSIS — R4701 Aphasia: Secondary | ICD-10-CM

## 2024-05-31 DIAGNOSIS — I10 Essential (primary) hypertension: Secondary | ICD-10-CM

## 2024-05-31 MED ORDER — HYDRALAZINE HCL 10 MG PO TABS
10.0000 mg | ORAL_TABLET | Freq: Three times a day (TID) | ORAL | 0 refills | Status: DC
  Filled 2024-05-31 (×2): qty 90, 30d supply, fill #0

## 2024-05-31 MED ORDER — HYDRALAZINE HCL 10 MG PO TABS
10.0000 mg | ORAL_TABLET | Freq: Three times a day (TID) | ORAL | 1 refills | Status: DC
Start: 2024-05-31 — End: 2024-05-31

## 2024-05-31 NOTE — Telephone Encounter (Addendum)
 Please ask patient what medication for BP he is taking.   Lisinopril  is not on the medication list.  Has  Carvedilol  and torsemide 

## 2024-05-31 NOTE — Telephone Encounter (Signed)
 done

## 2024-06-02 ENCOUNTER — Other Ambulatory Visit: Payer: Self-pay

## 2024-06-05 ENCOUNTER — Telehealth (INDEPENDENT_AMBULATORY_CARE_PROVIDER_SITE_OTHER): Payer: Self-pay | Admitting: *Deleted

## 2024-06-05 NOTE — Telephone Encounter (Signed)
 Interventions made and noted on another encounter. Patient was made aware of interventions as well. Has follow up appointment.

## 2024-06-05 NOTE — Telephone Encounter (Signed)
 SABRA

## 2024-06-05 NOTE — Telephone Encounter (Signed)
 Will contact pt and see if I can reach him

## 2024-06-05 NOTE — Telephone Encounter (Signed)
 Message below and in the note from 05/31/2024 at 1539 is from [facility], CPP. Route to PCP from a secure chat message in EP[facility].   Yes I think we may need to add hydralazine . He is already on a BB (carvedilol ). Other first-line agents are out because of prior intolerances (swelling with amlodipine  and poor renal function so no ACEs/ARBs/or thiazides). If we were to increase his dose of carvedilol  and add hydralazine  10 mg TID, I think this could help his BP come down

## 2024-06-10 ENCOUNTER — Other Ambulatory Visit (HOSPITAL_COMMUNITY): Payer: Self-pay

## 2024-06-10 DIAGNOSIS — H2513 Age-related nuclear cataract, bilateral: Secondary | ICD-10-CM | POA: Diagnosis not present

## 2024-06-10 DIAGNOSIS — H35033 Hypertensive retinopathy, bilateral: Secondary | ICD-10-CM | POA: Diagnosis not present

## 2024-06-10 DIAGNOSIS — H40053 Ocular hypertension, bilateral: Secondary | ICD-10-CM | POA: Diagnosis not present

## 2024-06-10 DIAGNOSIS — E113553 Type 2 diabetes mellitus with stable proliferative diabetic retinopathy, bilateral: Secondary | ICD-10-CM | POA: Diagnosis not present

## 2024-06-10 DIAGNOSIS — H3581 Retinal edema: Secondary | ICD-10-CM | POA: Diagnosis not present

## 2024-06-10 MED ORDER — DORZOLAMIDE HCL-TIMOLOL MAL 2-0.5 % OP SOLN
1.0000 [drp] | Freq: Two times a day (BID) | OPHTHALMIC | 11 refills | Status: DC
  Filled 2024-06-10: qty 10, 50d supply, fill #0

## 2024-06-10 MED ORDER — LATANOPROST 0.005 % OP SOLN
1.0000 [drp] | Freq: Every evening | OPHTHALMIC | 11 refills | Status: DC
  Filled 2024-06-10: qty 2.5, 25d supply, fill #0

## 2024-06-12 ENCOUNTER — Ambulatory Visit (INDEPENDENT_AMBULATORY_CARE_PROVIDER_SITE_OTHER): Admitting: Primary Care

## 2024-06-12 ENCOUNTER — Encounter (INDEPENDENT_AMBULATORY_CARE_PROVIDER_SITE_OTHER): Payer: Self-pay

## 2024-06-12 VITALS — BP 156/91 | HR 88 | Resp 14

## 2024-06-12 DIAGNOSIS — I10 Essential (primary) hypertension: Secondary | ICD-10-CM

## 2024-06-12 DIAGNOSIS — G4733 Obstructive sleep apnea (adult) (pediatric): Secondary | ICD-10-CM

## 2024-06-12 DIAGNOSIS — G4709 Other insomnia: Secondary | ICD-10-CM

## 2024-06-12 DIAGNOSIS — Z1211 Encounter for screening for malignant neoplasm of colon: Secondary | ICD-10-CM

## 2024-06-12 DIAGNOSIS — F32A Depression, unspecified: Secondary | ICD-10-CM

## 2024-06-12 MED ORDER — FLUOXETINE HCL 20 MG PO TABS: 20.0000 mg | ORAL_TABLET | Freq: Every day | ORAL | 1 refills | Status: DC

## 2024-06-12 MED ORDER — HYDRALAZINE HCL 10 MG PO TABS: 10.0000 mg | ORAL_TABLET | Freq: Three times a day (TID) | ORAL | 0 refills | Status: AC

## 2024-06-12 NOTE — Progress Notes (Signed)
 [facility]   Leroy Simmons is a 53 y.o. male presents for hypertension evaluation, patient has given his partner Leroy Simmons to be present at his appointment provide information that Leroy Simmons the patient does not provide example does not not take blood pressure medication consistently.  Initially he was in today for blood pressure follow-up he presented with clammy pale dehydrated no chest pains today but has had them in the past.  Also no endorses having shortness of breath, and headaches,.  Bilateral lateral 3+ edema.  EKG performed and normal due to LVH.  Explained to patient will adjust medication must take it daily monitor diet start exercising or will have to transfer him to cardiology that can manage his blood pressure.   Hypertension emergency hydralazine  50 mg.  Given in clinic.  Patient will return in 2 weeks taking his medication as directed before adjustments are made. .Patient presents with possible obstructive sleep apnea. Patent has a 3 years history of symptoms of daytime fatigue, morning fatigue, morning headache, and hypertension. Patient generally gets 3 or 4 hours of sleep per night, and states they generally have difficulty falling asleep, nightime awakenings, and difficulty falling back asleep if awakened. Snoring of severe severity is present. Apneic episodes is present. Nasal obstruction is not present.  Patient has not had tonsillectomy.    Patient denies adherence with medications.  Dietary habits include: sneak eat Exercise habits include:none Family / Social history: no   Past Medical History:  Diagnosis Date   Acute gangrenous appendicitis 09/25/2022   Anxiety    CHF (congestive heart failure) (HCC)    Coronary artery disease    Depression    Diabetes mellitus without complication (HCC)    Hypertension    Past Surgical History:  Procedure Laterality Date   INCISION AND DRAINAGE ABSCESS N/A 09/15/2023   Procedure: INCISION AND DRAINAGE OF BACK  ABSCESS;  Surgeon: Belinda Cough, MD;  Location: [facility] OR;  Service: General;  Laterality: N/A;   LAPAROSCOPIC APPENDECTOMY N/A 09/26/2022   Procedure: APPENDECTOMY LAPAROSCOPIC;  Surgeon: Lane Shope, MD;  Location: [facility] ORS;  Service: General;  Laterality: N/A;   LEFT HEART CATH AND CORONARY ANGIOGRAPHY N/A 03/21/2022   Procedure: LEFT HEART CATH AND CORONARY ANGIOGRAPHY;  Surgeon: Dann Candyce RAMAN, MD;  Location: [facility] INVASIVE CV LAB;  Service: Cardiovascular;  Laterality: N/A;   No Known Allergies Current Outpatient Medications on File Prior to Visit  Medication Sig Dispense Refill   albuterol  (VENTOLIN  HFA) 108 (90 Base) MCG/ACT inhaler Inhale 2 puffs into the lungs every 6 (six) hours as needed for wheezing or shortness of breath. 18 g 2   apixaban  (ELIQUIS ) 5 MG TABS tablet Take 1 tablet (5 mg total) by mouth 2 (two) times daily. 180 tablet 1   blood glucose meter kit and supplies Dispense based on patient and insurance preference. Use up to four times daily as directed. (FOR ICD-10 E10.9, E11.9). 1 each 0   carvedilol  (COREG ) 12.5 MG tablet Take 1 tablet (12.5 mg total) by mouth 2 (two) times daily with a meal. 180 tablet 1   dorzolamide -timolol  (COSOPT ) 2-0.5 % ophthalmic solution Place 1 drop into both eyes 2 (two) times daily. 10 mL 11   dorzolamide -timolol  (COSOPT ) 2-0.5 % ophthalmic solution Place 1 drop into both eyes 2 (two) times daily. 10 mL 11   hydrALAZINE  (APRESOLINE ) 10 MG tablet Take 1 tablet (10 mg total) by mouth 3 (three) times daily. 90 tablet 0   insulin  glargine, 2 Unit Dial , (TOUJEO   MAX SOLOSTAR) 300 UNIT/ML Solostar Pen Inject 52 Units into the skin daily. 9 mL 2   insulin  lispro (HUMALOG ) 100 UNIT/ML KwikPen Inject 8 Units into the skin 3 (three) times daily before meals. 15 mL 2   Insulin  Pen Needle 32G X 4 MM MISC Use with insulin  100 each 3   latanoprost  (XALATAN ) 0.005 % ophthalmic solution Administer 1 drop into the left eye nightly. 2.5 mL 7   latanoprost   (XALATAN ) 0.005 % ophthalmic solution Place 1 drop into both eyes at bedtime. 7.5 mL 11   latanoprost  (XALATAN ) 0.005 % ophthalmic solution Place 1 drop into both eyes at bedtime. 7.5 mL 11   pantoprazole  (PROTONIX ) 40 MG tablet Take 1 tablet (40 mg total) by mouth daily. 90 tablet 1   polyethylene glycol powder (GLYCOLAX /MIRALAX ) 17 GM/SCOOP powder Take 17 g by mouth 2 (two) times daily. (Patient taking differently: Take 17 g by mouth daily as needed for severe constipation.) 238 g 0   rosuvastatin  (CRESTOR ) 40 MG tablet Take 1 tablet (40 mg total) by mouth daily. 90 tablet 1   senna-docusate (SENOKOT-S) 8.6-50 MG tablet Take 1 tablet by mouth 2 (two) times daily. (Patient taking differently: Take 1 tablet by mouth daily as needed for moderate constipation.) 60 tablet 0   torsemide  (DEMADEX ) 100 MG tablet Take 1 tablet (100 mg total) by mouth 2 (two) times daily. 180 tablet 1   No current facility-administered medications on file prior to visit.   Social History   Socioeconomic History   Marital status: Single    Spouse name: Not on file   Number of children: 0   Years of education: Not on file   Highest education level: High school graduate  Occupational History   Occupation: retail  Tobacco Use   Smoking status: Never   Smokeless tobacco: Never  Vaping Use   Vaping status: Never Used  Substance and Sexual Activity   Alcohol use: Yes    Comment: rarely   Drug use: Never   Sexual activity: Not on file  Other Topics Concern   Not on file  Social History Narrative   Not on file   Social Drivers of Health   Financial Resource Strain: Low Risk  (01/20/2023)   Overall Financial Resource Strain (CARDIA)    Difficulty of Paying Living Expenses: Not very hard  Food Insecurity: Food Insecurity Present (01/09/2024)   Hunger Vital Sign    Worried About Running Out of Food in the Last Year: Sometimes true    Ran Out of Food in the Last Year: Sometimes true  Transportation Needs: No  Transportation Needs (01/09/2024)   PRAPARE - Administrator, Civil Service (Medical): No    Lack of Transportation (Non-Medical): No  Physical Activity: Not on file  Stress: Not on file  Social Connections: Not on file  Intimate Partner Violence: Not At Risk (02/14/2024)   Received from [facility]   Humiliation, Afraid, Rape, and Kick questionnaire    Within the last year, have you been afraid of your partner or ex-partner?: No    Within the last year, have you been humiliated or emotionally abused in other ways by your partner or ex-partner?: No    Within the last year, have you been kicked, hit, slapped, or otherwise physically hurt by your partner or ex-partner?: No    Within the last year, have you been raped or forced to have any kind of sexual activity by your partner or ex-partner?: No  Family History  Problem Relation Age of Onset   Heart failure Mother    Health Maintenance  Topic Date Due   Hepatitis B Vaccines 19-59 Average Risk (1 of 3 - 19+ 3-dose series) Never done   Colonoscopy  Never done   DTaP/Tdap/Td (2 - Tdap) 09/22/2019   Zoster Vaccines- Shingrix (1 of 2) Never done   Influenza Vaccine  05/03/2024   COVID-19 Vaccine (1 - 2024-25 season) Never done   HEMOGLOBIN A1C  10/26/2024   OPHTHALMOLOGY EXAM  02/13/2025   Diabetic kidney evaluation - eGFR measurement  04/25/2025   Diabetic kidney evaluation - Urine ACR  04/25/2025   FOOT EXAM  04/25/2025   Pneumococcal Vaccine: 50+ Years  Completed   Hepatitis C Screening  Completed   HIV Screening  Completed   HPV VACCINES  Aged Out   Meningococcal B Vaccine  Aged Out     OBJECTIVE:  Vitals:   06/12/24 0836 06/12/24 0912  BP: (!) 185/110 (!) 180/100  Pulse: 80   Resp: 14   SpO2: 99%     Physical Exam Vitals reviewed.  Constitutional:      Appearance: He is obese.  HENT:     Head: Normocephalic.     Right Ear: Tympanic membrane and external ear normal.     Left Ear: Tympanic membrane  and external ear normal.     Nose: Nose normal.  Eyes:     Extraocular Movements: Extraocular movements intact.  Cardiovascular:     Rate and Rhythm: Normal rate and regular rhythm.  Pulmonary:     Effort: Pulmonary effort is normal.     Breath sounds: Normal breath sounds.  Abdominal:     General: Bowel sounds are normal. There is distension.     Palpations: Abdomen is soft.  Musculoskeletal:        General: Normal range of motion.     Cervical back: Normal range of motion and neck supple.  Skin:    General: Skin is warm and dry.  Neurological:     Mental Status: He is alert and oriented to person, place, and time.  Psychiatric:        Mood and Affect: Mood normal.        Behavior: Behavior normal.        Thought Content: Thought content normal.        Judgment: Judgment normal.      ROS  Last 3 Office BP readings: BP Readings from Last 3 Encounters:  06/12/24 (!) 180/100  05/27/24 (!) 191/124  04/25/24 (!) 163/102    BMET    Component Value Date/Time   NA 137 04/25/2024 1607   K 4.1 04/25/2024 1607   CL 100 04/25/2024 1607   CO2 21 04/25/2024 1607   GLUCOSE 404 (H) 04/25/2024 1607   GLUCOSE 200 (H) 01/19/2024 0347   BUN 45 (H) 04/25/2024 1607   CREATININE 3.02 (H) 04/25/2024 1607   CALCIUM  8.7 04/25/2024 1607   GFRNONAA 24 (L) 01/19/2024 0347   GFRAA  10/26/2010 1143    >60        The eGFR has been calculated using the MDRD equation. This calculation has not been validated in all clinical situations. eGFR's persistently <60 mL/min signify possible Chronic Kidney Disease.    Renal function: CrCl cannot be calculated (Patient's most recent lab result is older than the maximum 21 days allowed.).  Clinical ASCVD: No  The ASCVD Risk score (Arnett DK, et al., 2019) failed to calculate for  the following reasons:   Risk score cannot be calculated because patient has a medical history suggesting prior/existing ASCVD  ASCVD risk factors include-  [place]   ASSESSMENT & PLAN: Leroy Simmons was seen today for hypertension.  Diagnoses and all orders for this visit:     Anxiety and depression -     FLUoxetine  (PROZAC ) 20 MG tablet; Take 1 tablet (20 mg total) by mouth daily.  Other insomnia -     Ambulatory referral to Pulmonology  OSA (obstructive sleep apnea) -     Ambulatory referral to Pulmonology  Colon cancer screening -     Ambulatory referral to Gastroenterology  Other orders -     hydrALAZINE  (APRESOLINE ) 10 MG tablet; Take 1 tablet (10 mg total) by mouth 3 (three) times daily.    Essential hypertension -Counseled on lifestyle modifications for blood pressure control including reduced dietary sodium, increased exercise, weight reduction and adequate sleep. Also, educated patient about the risk for cardiovascular events, stroke and heart attack. Also counseled patient about the importance of medication adherence. If you participate in smoking, it is important to stop using tobacco as this will increase the risks associated with uncontrolled blood pressure.   Goal BP:  For patients younger than 60: Goal BP < 130/80. For patients 60 and older: Goal BP < 140/90. For patients with diabetes: Goal BP < 130/80. Your most recent BP: 154/91  Minimize salt intake. Minimize alcohol intake -     EKG 12-Lead   This note has been created with Education officer, environmental. Any transcriptional errors are unintentional.   Leroy SHAUNNA Bohr, NP 06/12/2024, 9:35 AM Leroy Simmons might be listed

## 2024-06-12 NOTE — Patient Instructions (Addendum)
 Fluoxetine  Capsules or Tablets (Depression/Mood Disorders) What is this medication? FLUOXETINE  (floo OX e teen) treats depression, anxiety, obsessive-compulsive disorder (OCD), and eating disorders. It increases the amount of serotonin in the brain, a hormone that helps regulate mood. It belongs to a group of medications called SSRIs. This medicine may be used for other purposes; ask your health care provider or pharmacist if you have questions. COMMON BRAND NAME(S): Prozac  What should I tell my care team before I take this medication? They need to know if you have any of these conditions: Bipolar disorder or a family history of bipolar disorder Bleeding disorder Glaucoma Heart disease Liver disease Low levels of sodium in the blood Seizures Suicidal thoughts, plans, or attempt by you or a family member Take an MAOI, such as Carbex, Eldepryl, Marplan, Nardil, or Parnate Take medications that treat or prevent blood clots Thyroid disease An unusual or allergic reaction to fluoxetine , other medications, foods, dyes, or preservatives Pregnant or trying to get pregnant Breastfeeding How should I use this medication? Take this medication by mouth with a glass of water. Follow the directions on the prescription label. You can take this medication with or without food. Take your medication at regular intervals. Do not take it more often than directed. Do not stop taking this medication suddenly except upon the advice of your care team. Stopping this medication too quickly may cause serious side effects or your condition may worsen. A special MedGuide will be given to you by the pharmacist with each prescription and refill. Be sure to read this information carefully each time. Talk to your care team about the use of this medication in children. While it may be prescribed for children as young as 7 years for selected conditions, precautions do apply. Overdosage: If you think you have taken too much of  this medicine contact a poison control center or emergency room at once. NOTE: This medicine is only for you. Do not share this medicine with others. What if I miss a dose? If you miss a dose, skip the missed dose and go back to your regular dosing schedule. Do not take double or extra doses. What may interact with this medication? Do not take this medication with any of the following: Other medications containing fluoxetine , such as Sarafem  or Symbyax Cisapride Dronedarone Linezolid  MAOIs, such as Carbex, Eldepryl, Marplan, Nardil, and Parnate Methylene blue (injected into a vein) Pimozide Thioridazine This medication may also interact with the following: Alcohol Amphetamines Aspirin  and aspirin -like medications Carbamazepine Certain medications for mental health conditions Certain medications for migraine headache, such as almotriptan, eletriptan, frovatriptan, naratriptan, rizatriptan, sumatriptan, zolmitriptan Digoxin Diuretics Fentanyl  Flecainide Furazolidone Isoniazid Lithium Medications that help you fall asleep Medications that treat or prevent blood clots, such as warfarin, enoxaparin , and dalteparin NSAIDs, medications for pain and inflammation, such as ibuprofen or naproxen Other medications that cause heart rhythm changes Phenytoin Procarbazine Propafenone Rasagiline Ritonavir Supplements, such as St. John's wort, kava kava, valerian Tramadol Tryptophan Vinblastine This list may not describe all possible interactions. Give your health care provider a list of all the medicines, herbs, non-prescription drugs, or dietary supplements you use. Also tell them if you smoke, drink alcohol, or use illegal drugs. Some items may interact with your medicine. What should I watch for while using this medication? Tell your care team if your symptoms do not get better or if they get worse. Visit your care team for regular checks on your progress. Because it may take several  weeks to see  the full effects of this medication, it is important to continue your treatment as prescribed. Watch for new or worsening thoughts of suicide or depression. This includes sudden changes in mood, behavior, or thoughts. These changes can happen at any time but are more common in the beginning of treatment or after a change in dose. Call your care team right away if you experience these thoughts or worsening depression. This medication may cause mood and behavior changes, such as anxiety, nervousness, irritability, hostility, restlessness, excitability, hyperactivity, or trouble sleeping. These changes can happen at any time but are more common in the beginning of treatment or after a change in dose. Call your care team right away if you notice any of these symptoms. This medication may affect your coordination, reaction time, or judgment. Do not drive or operate machinery until you know how this medication affects you. Sit up or stand slowly to reduce the risk of dizzy or fainting spells. Drinking alcohol with this medication can increase the risk of these side effects. Your mouth may get dry. Chewing sugarless gum or sucking hard candy and drinking plenty of water may help. Contact your care team if the problem does not go away or is severe. This medication may affect blood sugar levels. If you have diabetes, check with your care team before you make changes to your diet or medications. What side effects may I notice from receiving this medication? Side effects that you should report to your care team as soon as possible: Allergic reactions--skin rash, itching, hives, swelling of the face, lips, tongue, or throat Bleeding--bloody or black, tar-like stools, red or dark brown urine, vomiting blood or brown material that looks like coffee grounds, small, red or purple spots on skin, unusual bleeding or bruising Heart rhythm changes--fast or irregular heartbeat, dizziness, feeling faint or  lightheaded, chest pain, trouble breathing Loss of appetite with weight loss Low sodium level--muscle weakness, fatigue, dizziness, headache, confusion Serotonin syndrome--irritability, confusion, fast or irregular heartbeat, muscle stiffness, twitching muscles, sweating, high fever, seizure, chills, vomiting, diarrhea Sudden eye pain or change in vision such as blurry vision, seeing halos around lights, vision loss Thoughts of suicide or self-harm, worsening mood, feelings of depression Side effects that usually do not require medical attention (report to your care team if they continue or are bothersome): Anxiety, nervousness Change in sex drive or performance Diarrhea Dry mouth Headache Excessive sweating Nausea Tremors or shaking Trouble sleeping Upset stomach This list may not describe all possible side effects. Call your doctor for medical advice about side effects. You may report side effects to FDA at 1-800-FDA-1088. Where should I keep my medication? Keep out of the reach of children and pets. Store at room temperature between 15 and 30 degrees C (59 and 86 degrees F). Get rid of any unused medication after the expiration date. NOTE: This sheet is a summary. It may not cover all possible information. If you have questions about this medicine, talk to your doctor, pharmacist, or health care provide Compression stockings Look for zipper easier to put on How many hours should you wear compression stockings? You should wear your compression stockings during the day and take them off before going to bed. Put them on again first thing in the morning. You should be given at least 2 stockings, or 2 pairs if you're wearing them on both legs. This means you can wear 1 stocking (or pair) while the other is being washed and dried.   2024 Elsevier/Gold Standard (2022-07-05 00:00:00)

## 2024-06-14 DIAGNOSIS — Z419 Encounter for procedure for purposes other than remedying health state, unspecified: Secondary | ICD-10-CM | POA: Diagnosis not present

## 2024-06-18 ENCOUNTER — Encounter (HOSPITAL_BASED_OUTPATIENT_CLINIC_OR_DEPARTMENT_OTHER): Payer: Self-pay

## 2024-06-18 ENCOUNTER — Ambulatory Visit (HOSPITAL_BASED_OUTPATIENT_CLINIC_OR_DEPARTMENT_OTHER)

## 2024-06-18 VITALS — BP 143/79 | HR 82 | Ht 67.0 in | Wt 245.0 lb

## 2024-06-18 DIAGNOSIS — R918 Other nonspecific abnormal finding of lung field: Secondary | ICD-10-CM | POA: Diagnosis not present

## 2024-06-18 DIAGNOSIS — G4710 Hypersomnia, unspecified: Secondary | ICD-10-CM | POA: Diagnosis not present

## 2024-06-18 DIAGNOSIS — R9389 Abnormal findings on diagnostic imaging of other specified body structures: Secondary | ICD-10-CM

## 2024-06-18 DIAGNOSIS — G4719 Other hypersomnia: Secondary | ICD-10-CM

## 2024-06-18 NOTE — Progress Notes (Signed)
 @Patient  ID: Leroy Simmons, male    DOB: Feb 08, 1971, 53 y.o.   MRN: [id number]  Chief Complaint  Patient presents with   Establish Care    New Sleep     Referring provider: Celestia Rosaline SQUIBB, NP  HPI: Leroy JONELLE. Damron is a 53 year old male with past medical history of hypertension, heart failure, asthma, DVT/PE, seasonal allergies who presents as a new patient for evaluation of sleep.  He reports that he has had witnessed apneas, snoring, paroxysmal nocturnal dyspnea, excessive daytime sleepiness, decreased energy, and morning headaches.  He also reports that during previous hospital stays he has been told that he has apnea and has required the administration of either oxygen or CPAP during the admission.  He states that he has never been evaluated for it before and has not had a sleep test in the past.  He denies chest pain, shortness of breath, cough, fever, chills, weight loss.  He reports that he has gained weight in the last couple of years but is unable to quantify it.  TEST/EVENTS :  Chest CT 01/09/2024: IMPRESSION: 1. Diffuse and multifocal ground-glass and ground-glass nodularity throughout both lungs, with a basilar predominant distribution. Findings are progressed from CT earlier today, favor multifocal pneumonia. 2. Smooth septal thickening is suspicious for pulmonary edema. 3. Mild mediastinal adenopathy, likely reactive. 4. Cardiomegaly with coronary artery calcifications and small pericardial effusion. 5. Dependent skin thickening with areas of subcutaneous edema, may represent cellulitis. No obvious drainable collection in the absence of IV contrast.   Immunization History  Administered Date(s) Administered   Influenza Whole 09/21/2009   Influenza,inj,Quad PF,6+ Mos 08/06/2014, 07/18/2019   PNEUMOCOCCAL CONJUGATE-20 03/24/2022   Pneumococcal Polysaccharide-23 09/21/2009, 03/27/2014   Td 09/21/2009    Past Medical History:  Diagnosis Date   Acute gangrenous  appendicitis 09/25/2022   Anxiety    CHF (congestive heart failure) (HCC)    Coronary artery disease    Depression    Diabetes mellitus without complication (HCC)    Hypertension     Tobacco History: Social History   Tobacco Use  Smoking Status Never  Smokeless Tobacco Never   Counseling given: Not Answered   Outpatient Medications Prior to Visit  Medication Sig Dispense Refill   albuterol  (VENTOLIN  HFA) 108 (90 Base) MCG/ACT inhaler Inhale 2 puffs into the lungs every 6 (six) hours as needed for wheezing or shortness of breath. 18 g 2   apixaban  (ELIQUIS ) 5 MG TABS tablet Take 1 tablet (5 mg total) by mouth 2 (two) times daily. 180 tablet 1   blood glucose meter kit and supplies Dispense based on patient and insurance preference. Use up to four times daily as directed. (FOR ICD-10 E10.9, E11.9). 1 each 0   carvedilol  (COREG ) 12.5 MG tablet Take 1 tablet (12.5 mg total) by mouth 2 (two) times daily with a meal. 180 tablet 1   dorzolamide -timolol  (COSOPT ) 2-0.5 % ophthalmic solution Place 1 drop into both eyes 2 (two) times daily. 10 mL 11   dorzolamide -timolol  (COSOPT ) 2-0.5 % ophthalmic solution Place 1 drop into both eyes 2 (two) times daily. 10 mL 11   FLUoxetine  (PROZAC ) 20 MG tablet Take 1 tablet (20 mg total) by mouth daily. 90 tablet 1   hydrALAZINE  (APRESOLINE ) 10 MG tablet Take 1 tablet (10 mg total) by mouth 3 (three) times daily. 90 tablet 0   insulin  glargine, 2 Unit Dial , (TOUJEO  MAX SOLOSTAR) 300 UNIT/ML Solostar Pen Inject 52 Units into the skin daily. 9 mL 2  insulin  lispro (HUMALOG ) 100 UNIT/ML KwikPen Inject 8 Units into the skin 3 (three) times daily before meals. 15 mL 2   Insulin  Pen Needle 32G X 4 MM MISC Use with insulin  100 each 3   latanoprost  (XALATAN ) 0.005 % ophthalmic solution Administer 1 drop into the left eye nightly. 2.5 mL 7   latanoprost  (XALATAN ) 0.005 % ophthalmic solution Place 1 drop into both eyes at bedtime. 7.5 mL 11   latanoprost  (XALATAN )  0.005 % ophthalmic solution Place 1 drop into both eyes at bedtime. 7.5 mL 11   pantoprazole  (PROTONIX ) 40 MG tablet Take 1 tablet (40 mg total) by mouth daily. 90 tablet 1   rosuvastatin  (CRESTOR ) 40 MG tablet Take 1 tablet (40 mg total) by mouth daily. 90 tablet 1   torsemide  (DEMADEX ) 100 MG tablet Take 1 tablet (100 mg total) by mouth 2 (two) times daily. 180 tablet 1   No facility-administered medications prior to visit.     Review of Systems: As per HPI  Constitutional:   No  weight loss, night sweats,  Fevers, chills, fatigue, or  lassitude.  HEENT:   No headaches,  Difficulty swallowing,  Tooth/dental problems, or  Sore throat,                No sneezing, itching, ear ache, nasal congestion, post nasal drip,   CV:  No chest pain,  Orthopnea, PND, swelling in lower extremities, anasarca, dizziness, palpitations, syncope.   GI  No heartburn, indigestion, abdominal pain, nausea, vomiting, diarrhea, change in bowel habits, loss of appetite, bloody stools.   Resp: No shortness of breath with exertion or at rest.  No excess mucus, no productive cough,  No non-productive cough,  No coughing up of blood.  No change in color of mucus.  No wheezing.  No chest wall deformity  Skin: no rash or lesions.  GU: no dysuria, change in color of urine, no urgency or frequency.  No flank pain, no hematuria   MS:  No joint pain or swelling.  No decreased range of motion.  No back pain.    Physical Exam  BP (!) 143/79   Pulse 82   Ht 5' 7 (1.702 m)   Wt 245 lb (111.1 kg)   SpO2 96%   BMI 38.37 kg/m   GEN: A/Ox3; pleasant , NAD, well nourished    HEENT:  [place]/AT,  EACs-clear, TMs-wnl, NOSE-clear, THROAT-clear, no lesions, no postnasal drip or exudate noted.  Mallampati 4  NECK:  Supple w/ fair ROM; no JVD; normal carotid impulses w/o bruits; no thyromegaly or nodules palpated; no lymphadenopathy.    RESP  Clear  P & A; w/o, wheezes/ rales/ or rhonchi. no accessory muscle use, no dullness  to percussion  CARD:  RRR, no m/r/g, 1+ peripheral edema bilaterally, pulses intact, no cyanosis or clubbing.  GI:   Obese, soft & nt; nml bowel sounds; no organomegaly or masses detected.   Musco: Warm bil, no deformities or joint swelling noted.   Neuro: alert, no focal deficits noted.    Skin: Warm, no lesions or rashes    Lab Results:  CBC    Component Value Date/Time   WBC 6.6 04/25/2024 1607   WBC 10.2 01/18/2024 0351   RBC 4.40 04/25/2024 1607   RBC 4.75 01/18/2024 0351   HGB 13.2 04/25/2024 1607   HCT 41.3 04/25/2024 1607   PLT 259 04/25/2024 1607   MCV 94 04/25/2024 1607   MCH 30.0 04/25/2024 1607   MCH  28.8 01/18/2024 0351   MCHC 32.0 04/25/2024 1607   MCHC 33.9 01/18/2024 0351   RDW 13.3 04/25/2024 1607   LYMPHSABS 2.1 04/25/2024 1607   MONOABS 0.8 01/09/2024 0534   EOSABS 0.2 04/25/2024 1607   BASOSABS 0.0 04/25/2024 1607    BMET    Component Value Date/Time   NA 137 04/25/2024 1607   K 4.1 04/25/2024 1607   CL 100 04/25/2024 1607   CO2 21 04/25/2024 1607   GLUCOSE 404 (H) 04/25/2024 1607   GLUCOSE 200 (H) 01/19/2024 0347   BUN 45 (H) 04/25/2024 1607   CREATININE 3.02 (H) 04/25/2024 1607   CALCIUM  8.7 04/25/2024 1607   GFRNONAA 24 (L) 01/19/2024 0347   GFRAA  10/26/2010 1143    >60        The eGFR has been calculated using the MDRD equation. This calculation has not been validated in all clinical situations. eGFR's persistently <60 mL/min signify possible Chronic Kidney Disease.    BNP    Component Value Date/Time   BNP 230.6 (H) 01/09/2024 0534    ProBNP No results found for: PROBNP  Imaging: No results found.  Administration History     None           No data to display          No results found for: NITRICOXIDE   Assessment & Plan:   Assessment & Plan Excessive daytime sleepiness - Based on complaints of witnessed apneas, snoring, PND, headaches, excessive daytime sleepiness, and decreased energy, this  is concerning for a diagnosis of sleep apnea. -Plan for home sleep test; follow-up in clinic 4 to 6 weeks after the test for review of results. -Sleep hygiene discussed today; informational packets for sleep apnea and sleep study included on AVS. Abnormal chest CT - May need follow-up CT to determine resolution of GGO's as noted on report. - Readdress at follow-up appointment.   Return in about 6 weeks (around 07/30/2024) for sleep study review.  Candis Dandy, PA-C 06/18/2024

## 2024-06-18 NOTE — Progress Notes (Signed)
 Epworth Sleepiness Scale  Use the following scale to choose the most appropriate number for each situation. 0 Would never nod off 1  Slight  chance of nodding off 2 Moderate chance of nodding off 3 High chance of nodding off  Sitting and reading: 2 Watching TV: 2 Sitting, inactive, in a public place (e.g., in a meeting, theater, or dinner event): 2 As a passenger in a car for an hour or more without stopping for a break: 2 Lying down to rest when circumstances permit:2 Sitting and talking to someone: 2  Sitting quietly after a meal without alcohol: 2  In a car, while stopped for a few  minutes in traffic or at a light: 0   TOTOAL: 14

## 2024-06-18 NOTE — Patient Instructions (Signed)
 Complete home sleep test as ordered.  Follow up in 4-6 weeks to discuss results.  Follow sleep hygiene as discussed; see attached sleep apnea information.

## 2024-06-19 ENCOUNTER — Other Ambulatory Visit (HOSPITAL_COMMUNITY): Payer: Self-pay

## 2024-06-20 ENCOUNTER — Other Ambulatory Visit: Payer: Self-pay

## 2024-06-20 ENCOUNTER — Other Ambulatory Visit: Payer: Self-pay | Admitting: Pharmacist

## 2024-06-20 MED ORDER — FLUOXETINE HCL 20 MG PO CAPS: 20.0000 mg | ORAL_CAPSULE | Freq: Every day | ORAL | 3 refills | Status: AC

## 2024-06-21 ENCOUNTER — Telehealth (INDEPENDENT_AMBULATORY_CARE_PROVIDER_SITE_OTHER): Payer: Self-pay | Admitting: Primary Care

## 2024-06-21 NOTE — Telephone Encounter (Signed)
 Contacted pt resch appt 9/24 to 10/13  pcp not in the office

## 2024-06-26 ENCOUNTER — Ambulatory Visit (INDEPENDENT_AMBULATORY_CARE_PROVIDER_SITE_OTHER)

## 2024-07-03 ENCOUNTER — Telehealth: Payer: Self-pay

## 2024-07-03 NOTE — Telephone Encounter (Signed)
 Called to confirm Blood thinner status. Per Med list patient is taking Eliquis . Pt advised to call and confirm. If he is he will need to be set up for an OV before we can proceed with his colonoscopy.

## 2024-07-07 NOTE — Progress Notes (Signed)
 S:     No chief complaint on file.  53 y.o. male who presents for diabetes evaluation, education, and management. Patient arrives in good spirits and presents without any assistance.   Patient was referred and last seen by Primary Care Provider, Rosaline Bohr, NP, on 04/25/24. At last visit, patient's A1c was 10.3% (up from 9.3% prior). Over the last year, patient's A1c has stayed consistently high with between 9-10%.  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 Patient reports diabetes is longstanding.   Last saw pharmacy 05/27/24. Patient presented with chief complaint of fatigue and feeling off. He reported shaking in his arms that feels similar to how he feels with low blood sugar. Sugar at time of visit was 262 mg/dL. While his speech was not slurred, he did struggle with verbal hesitancy.  Endorsed not checking blood sugar since the previous night, with reading in the 190s.  He had eaten breakfast, but nothing prior to appointment with no medications taken. Lantus  was discontinued, Toujeo  Max initiated at 52 units in the morning, and Humalog  increased to 8 units TID with meals.  Today, patient presents to appointment for follow-up.  Tolerating insulin ? Checking bg? Readings? Hypo? Hyper?  Family/Social History: Mother: HF Does not smoke or use smokeless tobacco. Does not drink. Does not do drugs  Current diabetes medications include: Lantus  45 units twice daily (reports taking 22 units BID), Humalog  5 units three times a day with meals Current hypertension medications include: carvedilol  12.5 mg twice daily with meals, torsemide  100 mg  twice daily Current hyperlipidemia medications include: rosuvastatin  40 mg once daily  Patient reports adherence to taking all medications as prescribed.   Insurance coverage: [place] Medicaid  Patient reports symptoms of hypoglycemic events but gives no readings.   Patient reports nocturia (nighttime urination).  Patient reports neuropathy (nerve pain). Patient reports visual changes. Patient reported self foot exams.   O:   Lab Results  Component Value Date   HGBA1C 10.3 (A) 04/25/2024   There were no vitals filed for this visit.  Lipid Panel     Component Value Date/Time   CHOL 248 (H) 04/25/2024 1607   TRIG 475 (H) 04/25/2024 1607   HDL 37 (L) 04/25/2024 1607   CHOLHDL 6.7 (H) 04/25/2024 1607   CHOLHDL 4.6 01/19/2023 0617   VLDL 46 (H) 01/19/2023 0617   LDLCALC 126 (H) 04/25/2024 1607    Clinical Atherosclerotic Cardiovascular Disease (ASCVD): Yes  The ASCVD Risk score (Arnett DK, et al., 2019) failed to calculate for the following reasons:   Risk score cannot be calculated because patient has a medical history suggesting prior/existing ASCVD   Patient is participating in a Managed Medicaid Plan: YES    A/P: Diabetes longstanding currently uncontrolled. Patient is symptomatic both from a hyper and hypoglycemic standpoint today. He is able to verbalize appropriate hypoglycemia management plan. Medication adherence appears to be okay but we will set him up with our pharmacy and optimize his insulin  regimen for better control.  -Discontinued Lantus . -Started Toujeo  Max. Take 52 units in the morning.  -Increased Humalog  to 8 units TID before meals.  -Patient educated on purpose, proper use, and potential adverse  effects of Toujeo , Humalog .  -Extensively discussed pathophysiology of diabetes, recommended lifestyle interventions, dietary effects on blood sugar control.  -Counseled on s/sx of and management of hypoglycemia.  -Next A1c anticipated 07/2024.   Written patient  instructions provided. Patient verbalized understanding of treatment plan.  Total time in face to face counseling 45 minutes.    Follow-up:  Pharmacist in 1 month.  Jenkins Graces, PharmD PGY1 Pharmacy Resident [phone]

## 2024-07-08 ENCOUNTER — Telehealth: Payer: Self-pay

## 2024-07-08 ENCOUNTER — Telehealth: Payer: Self-pay | Admitting: Pharmacist

## 2024-07-08 ENCOUNTER — Ambulatory Visit: Attending: Primary Care | Admitting: Pharmacist

## 2024-07-08 ENCOUNTER — Encounter: Payer: Self-pay | Admitting: Pharmacist

## 2024-07-08 ENCOUNTER — Other Ambulatory Visit: Payer: Self-pay

## 2024-07-08 DIAGNOSIS — Z794 Long term (current) use of insulin: Secondary | ICD-10-CM | POA: Diagnosis not present

## 2024-07-08 DIAGNOSIS — E1165 Type 2 diabetes mellitus with hyperglycemia: Secondary | ICD-10-CM | POA: Diagnosis not present

## 2024-07-08 LAB — POCT GLYCOSYLATED HEMOGLOBIN (HGB A1C): HbA1c, POC (controlled diabetic range): 8 % — AB (ref 0.0–7.0)

## 2024-07-08 MED ORDER — FREESTYLE LIBRE 3 READER DEVI
0 refills | Status: AC
  Filled 2024-07-08: qty 1, 90d supply, fill #0

## 2024-07-08 MED ORDER — FREESTYLE LIBRE 3 PLUS SENSOR MISC
6 refills | Status: AC
  Filled 2024-07-08: qty 2, 30d supply, fill #0

## 2024-07-08 NOTE — Telephone Encounter (Signed)
 Patient with T2DM dx but is on two types of insulin , 4 injections daily. Can we initiate a PA for [place] CGM?

## 2024-07-08 NOTE — Telephone Encounter (Signed)
 Pharmacy Patient Advocate Encounter   Received notification from CoverMyMeds that prior authorization for FREESTYLE LIBRE 3 PLUS SENSOR is required/requested.   Insurance verification completed.   The patient is insured through [employer] MEDICAID.   Per test claim: PA required; PA submitted to above mentioned insurance via CoverMyMeds Key/confirmation #/EOC [id number] Status is pending

## 2024-07-09 ENCOUNTER — Other Ambulatory Visit: Payer: Self-pay

## 2024-07-09 LAB — LIPID PANEL
Chol/HDL Ratio: 5.7 ratio — ABNORMAL HIGH (ref 0.0–5.0)
Cholesterol, Total: 210 mg/dL — ABNORMAL HIGH (ref 100–199)
HDL: 37 mg/dL — ABNORMAL LOW (ref >39)
LDL Chol Calc (NIH): 136 mg/dL — ABNORMAL HIGH (ref 0–99)
Triglycerides: 207 mg/dL — ABNORMAL HIGH (ref 0–149)
VLDL Cholesterol Cal: 37 mg/dL (ref 5–40)

## 2024-07-09 LAB — COMPREHENSIVE METABOLIC PANEL WITH GFR
ALT: 20 IU/L (ref 0–44)
AST: 18 IU/L (ref 0–40)
Albumin: 3.7 g/dL — ABNORMAL LOW (ref 4.1–5.1)
Alkaline Phosphatase: 150 IU/L — ABNORMAL HIGH (ref 47–123)
BUN/Creatinine Ratio: 15 (ref 9–20)
BUN: 40 mg/dL — ABNORMAL HIGH (ref 6–24)
Bilirubin Total: 0.5 mg/dL (ref 0.0–1.2)
CO2: 23 mmol/L (ref 20–29)
Calcium: 9 mg/dL (ref 8.7–10.2)
Chloride: 108 mmol/L — ABNORMAL HIGH (ref 96–106)
Creatinine, Ser: 2.74 mg/dL — ABNORMAL HIGH (ref 0.76–1.27)
Globulin, Total: 2.7 g/dL (ref 1.5–4.5)
Glucose: 156 mg/dL — ABNORMAL HIGH (ref 70–99)
Potassium: 4.7 mmol/L (ref 3.5–5.2)
Sodium: 142 mmol/L (ref 134–144)
Total Protein: 6.4 g/dL (ref 6.0–8.5)
eGFR: 27 mL/min/1.73 — ABNORMAL LOW (ref >59)

## 2024-07-09 LAB — LIPASE: Lipase: 47 U/L (ref 13–78)

## 2024-07-09 NOTE — Telephone Encounter (Signed)
 Pharmacy Patient Advocate Encounter  Received notification from [facility] MEDICAID that Prior Authorization for FREESTYLE LIBRE 3 PLUS SENSOR has been APPROVED from 07/08/2024 to 01/04/2025   PA #/Case ID/Reference #: [id number]

## 2024-07-10 ENCOUNTER — Other Ambulatory Visit: Payer: Self-pay

## 2024-07-10 ENCOUNTER — Telehealth: Payer: Self-pay | Admitting: Pharmacist

## 2024-07-10 ENCOUNTER — Encounter

## 2024-07-10 MED ORDER — OZEMPIC (0.25 OR 0.5 MG/DOSE) 2 MG/3ML ~~LOC~~ SOPN
0.2500 mg | PEN_INJECTOR | SUBCUTANEOUS | 1 refills | Status: AC
  Filled 2024-07-10: qty 3, 42d supply, fill #0
  Filled 2024-07-29: qty 3, 28d supply, fill #0

## 2024-07-10 NOTE — Telephone Encounter (Signed)
 Contacted patient to go over results from [facility] labs (07/08/24).   CMP shows improvement in Scr. While his eGFR is < 30 his Scr improved from 3.02 to 2.74 with improved glycemic and BP control. Glucose shows tremendous improvement. Down from 404 mg/dL in July to 843 mg/dL this week. Liver function stable. Electrolytes normal.   Lipids reveal improved TC and TG levels, likely due to improved glycemic control. LDL remains elevated at 136. Of note, he is on high intensity statin therapy with rosuvastatin  40 mg daily. We will continue this and look to add on therapy in the future should LDL levels remain elevated.   Lipase nl. We can start Ozempic at this time.   I called the patient and confirmed these results with him. Ozempic sent for the starting dose to his pharmacy.   Herlene Fleeta Morris, PharmD, JAQUELINE, CPP Clinical Pharmacist [facility] [phone]

## 2024-07-10 NOTE — Telephone Encounter (Signed)
Can we start a PA for this patient's Ozempic? 

## 2024-07-11 ENCOUNTER — Other Ambulatory Visit: Payer: Self-pay

## 2024-07-11 ENCOUNTER — Telehealth: Payer: Self-pay

## 2024-07-11 NOTE — Telephone Encounter (Signed)
 Pharmacy Patient Advocate Encounter   Received notification from CoverMyMeds that prior authorization for OZEMPIC is required/requested.   Insurance verification completed.   The patient is insured through [facility] MEDICAID.   Per test claim: PA required; PA submitted to above mentioned insurance via CoverMyMeds Key/confirmation #/EOC [facility] Status is pending

## 2024-07-12 ENCOUNTER — Other Ambulatory Visit: Payer: Self-pay

## 2024-07-12 NOTE — Telephone Encounter (Signed)
 Pharmacy Patient Advocate Encounter  Received notification from [facility] MEDICAID that Prior Authorization for [facility] has been APPROVED from 07/11/2024 to 07/11/2025   PA #/Case ID/Reference #: [id number]

## 2024-07-14 DIAGNOSIS — Z419 Encounter for procedure for purposes other than remedying health state, unspecified: Secondary | ICD-10-CM | POA: Diagnosis not present

## 2024-07-15 ENCOUNTER — Ambulatory Visit (INDEPENDENT_AMBULATORY_CARE_PROVIDER_SITE_OTHER)

## 2024-07-15 NOTE — Telephone Encounter (Signed)
 Noted

## 2024-07-16 ENCOUNTER — Ambulatory Visit

## 2024-07-18 ENCOUNTER — Other Ambulatory Visit: Payer: Self-pay

## 2024-07-19 ENCOUNTER — Ambulatory Visit (HOSPITAL_BASED_OUTPATIENT_CLINIC_OR_DEPARTMENT_OTHER)

## 2024-07-19 DIAGNOSIS — G4719 Other hypersomnia: Secondary | ICD-10-CM

## 2024-07-22 ENCOUNTER — Ambulatory Visit (INDEPENDENT_AMBULATORY_CARE_PROVIDER_SITE_OTHER): Payer: Self-pay | Admitting: Primary Care

## 2024-07-22 DIAGNOSIS — N1831 Chronic kidney disease, stage 3a: Secondary | ICD-10-CM

## 2024-07-23 ENCOUNTER — Other Ambulatory Visit: Payer: Self-pay | Admitting: Pharmacist

## 2024-07-23 ENCOUNTER — Other Ambulatory Visit: Payer: Self-pay

## 2024-07-23 MED ORDER — ATORVASTATIN CALCIUM 80 MG PO TABS
80.0000 mg | ORAL_TABLET | Freq: Every day | ORAL | 3 refills | Status: AC
Start: 2024-07-23 — End: ?
  Filled 2024-07-23: qty 90, 90d supply, fill #0

## 2024-07-24 ENCOUNTER — Encounter: Admitting: Internal Medicine

## 2024-07-29 ENCOUNTER — Other Ambulatory Visit: Payer: Self-pay

## 2024-07-29 ENCOUNTER — Ambulatory Visit (INDEPENDENT_AMBULATORY_CARE_PROVIDER_SITE_OTHER): Payer: Self-pay | Admitting: Primary Care

## 2024-07-30 ENCOUNTER — Ambulatory Visit (HOSPITAL_BASED_OUTPATIENT_CLINIC_OR_DEPARTMENT_OTHER)

## 2024-07-30 ENCOUNTER — Other Ambulatory Visit: Payer: Self-pay

## 2024-08-06 ENCOUNTER — Observation Stay (HOSPITAL_COMMUNITY)

## 2024-08-06 ENCOUNTER — Emergency Department (HOSPITAL_COMMUNITY)

## 2024-08-06 ENCOUNTER — Inpatient Hospital Stay (HOSPITAL_COMMUNITY)
Admission: EM | Admit: 2024-08-06 | Discharge: 2024-08-16 | DRG: 286 | Disposition: A | Discharge status: Home / Self Care | Attending: Infectious Diseases | Admitting: Infectious Diseases

## 2024-08-06 ENCOUNTER — Encounter (HOSPITAL_COMMUNITY): Payer: Self-pay

## 2024-08-06 ENCOUNTER — Other Ambulatory Visit: Payer: Self-pay

## 2024-08-06 DIAGNOSIS — E1165 Type 2 diabetes mellitus with hyperglycemia: Secondary | ICD-10-CM | POA: Diagnosis present

## 2024-08-06 DIAGNOSIS — R600 Localized edema: Principal | ICD-10-CM

## 2024-08-06 DIAGNOSIS — J9601 Acute respiratory failure with hypoxia: Secondary | ICD-10-CM | POA: Diagnosis not present

## 2024-08-06 DIAGNOSIS — R748 Abnormal levels of other serum enzymes: Secondary | ICD-10-CM | POA: Diagnosis present

## 2024-08-06 DIAGNOSIS — I2510 Atherosclerotic heart disease of native coronary artery without angina pectoris: Secondary | ICD-10-CM | POA: Diagnosis present

## 2024-08-06 DIAGNOSIS — N1831 Chronic kidney disease, stage 3a: Secondary | ICD-10-CM

## 2024-08-06 DIAGNOSIS — R0609 Other forms of dyspnea: Secondary | ICD-10-CM | POA: Diagnosis not present

## 2024-08-06 DIAGNOSIS — E876 Hypokalemia: Secondary | ICD-10-CM | POA: Diagnosis present

## 2024-08-06 DIAGNOSIS — D638 Anemia in other chronic diseases classified elsewhere: Secondary | ICD-10-CM | POA: Diagnosis present

## 2024-08-06 DIAGNOSIS — D631 Anemia in chronic kidney disease: Secondary | ICD-10-CM | POA: Diagnosis present

## 2024-08-06 DIAGNOSIS — J45909 Unspecified asthma, uncomplicated: Secondary | ICD-10-CM | POA: Diagnosis not present

## 2024-08-06 DIAGNOSIS — I130 Hypertensive heart and chronic kidney disease with heart failure and stage 1 through stage 4 chronic kidney disease, or unspecified chronic kidney disease: Secondary | ICD-10-CM

## 2024-08-06 DIAGNOSIS — Z86711 Personal history of pulmonary embolism: Secondary | ICD-10-CM

## 2024-08-06 DIAGNOSIS — Z794 Long term (current) use of insulin: Secondary | ICD-10-CM

## 2024-08-06 DIAGNOSIS — R051 Acute cough: Secondary | ICD-10-CM | POA: Diagnosis not present

## 2024-08-06 DIAGNOSIS — Z23 Encounter for immunization: Secondary | ICD-10-CM

## 2024-08-06 DIAGNOSIS — E785 Hyperlipidemia, unspecified: Secondary | ICD-10-CM | POA: Diagnosis present

## 2024-08-06 DIAGNOSIS — I132 Hypertensive heart and chronic kidney disease with heart failure and with stage 5 chronic kidney disease, or end stage renal disease: Principal | ICD-10-CM | POA: Diagnosis present

## 2024-08-06 DIAGNOSIS — E1122 Type 2 diabetes mellitus with diabetic chronic kidney disease: Secondary | ICD-10-CM | POA: Diagnosis present

## 2024-08-06 DIAGNOSIS — Z91148 Patient's other noncompliance with medication regimen for other reason: Secondary | ICD-10-CM

## 2024-08-06 DIAGNOSIS — N2581 Secondary hyperparathyroidism of renal origin: Secondary | ICD-10-CM | POA: Diagnosis present

## 2024-08-06 DIAGNOSIS — R0602 Shortness of breath: Secondary | ICD-10-CM | POA: Diagnosis not present

## 2024-08-06 DIAGNOSIS — N250 Renal osteodystrophy: Secondary | ICD-10-CM | POA: Diagnosis present

## 2024-08-06 DIAGNOSIS — Z86718 Personal history of other venous thrombosis and embolism: Secondary | ICD-10-CM | POA: Diagnosis not present

## 2024-08-06 DIAGNOSIS — Z5902 Unsheltered homelessness: Secondary | ICD-10-CM

## 2024-08-06 DIAGNOSIS — T501X5A Adverse effect of loop [high-ceiling] diuretics, initial encounter: Secondary | ICD-10-CM | POA: Diagnosis present

## 2024-08-06 DIAGNOSIS — R809 Proteinuria, unspecified: Secondary | ICD-10-CM | POA: Diagnosis not present

## 2024-08-06 DIAGNOSIS — R7989 Other specified abnormal findings of blood chemistry: Secondary | ICD-10-CM | POA: Diagnosis not present

## 2024-08-06 DIAGNOSIS — I272 Pulmonary hypertension, unspecified: Secondary | ICD-10-CM | POA: Diagnosis present

## 2024-08-06 DIAGNOSIS — G4733 Obstructive sleep apnea (adult) (pediatric): Secondary | ICD-10-CM | POA: Diagnosis present

## 2024-08-06 DIAGNOSIS — E8809 Other disorders of plasma-protein metabolism, not elsewhere classified: Secondary | ICD-10-CM | POA: Diagnosis present

## 2024-08-06 DIAGNOSIS — R0989 Other specified symptoms and signs involving the circulatory and respiratory systems: Secondary | ICD-10-CM | POA: Diagnosis not present

## 2024-08-06 DIAGNOSIS — Z6839 Body mass index (BMI) 39.0-39.9, adult: Secondary | ICD-10-CM

## 2024-08-06 DIAGNOSIS — I5033 Acute on chronic diastolic (congestive) heart failure: Secondary | ICD-10-CM | POA: Diagnosis not present

## 2024-08-06 DIAGNOSIS — N184 Chronic kidney disease, stage 4 (severe): Secondary | ICD-10-CM | POA: Diagnosis present

## 2024-08-06 DIAGNOSIS — Z79899 Other long term (current) drug therapy: Secondary | ICD-10-CM

## 2024-08-06 DIAGNOSIS — N179 Acute kidney failure, unspecified: Secondary | ICD-10-CM | POA: Diagnosis present

## 2024-08-06 DIAGNOSIS — E119 Type 2 diabetes mellitus without complications: Secondary | ICD-10-CM

## 2024-08-06 DIAGNOSIS — I509 Heart failure, unspecified: Secondary | ICD-10-CM

## 2024-08-06 DIAGNOSIS — K219 Gastro-esophageal reflux disease without esophagitis: Secondary | ICD-10-CM | POA: Diagnosis present

## 2024-08-06 DIAGNOSIS — Z5941 Food insecurity: Secondary | ICD-10-CM

## 2024-08-06 DIAGNOSIS — N185 Chronic kidney disease, stage 5: Secondary | ICD-10-CM | POA: Diagnosis present

## 2024-08-06 DIAGNOSIS — Z7901 Long term (current) use of anticoagulants: Secondary | ICD-10-CM

## 2024-08-06 DIAGNOSIS — E872 Acidosis, unspecified: Secondary | ICD-10-CM | POA: Diagnosis present

## 2024-08-06 DIAGNOSIS — D509 Iron deficiency anemia, unspecified: Secondary | ICD-10-CM | POA: Diagnosis present

## 2024-08-06 DIAGNOSIS — I517 Cardiomegaly: Secondary | ICD-10-CM | POA: Diagnosis not present

## 2024-08-06 DIAGNOSIS — Z7985 Long-term (current) use of injectable non-insulin antidiabetic drugs: Secondary | ICD-10-CM

## 2024-08-06 DIAGNOSIS — Z8249 Family history of ischemic heart disease and other diseases of the circulatory system: Secondary | ICD-10-CM

## 2024-08-06 DIAGNOSIS — Z1152 Encounter for screening for COVID-19: Secondary | ICD-10-CM

## 2024-08-06 LAB — RESP PANEL BY RT-PCR (RSV, FLU A&B, COVID)  RVPGX2
Influenza A by PCR: NEGATIVE
Influenza B by PCR: NEGATIVE
Resp Syncytial Virus by PCR: NEGATIVE
SARS Coronavirus 2 by RT PCR: NEGATIVE

## 2024-08-06 LAB — ECHOCARDIOGRAM COMPLETE
AR max vel: 4.22 cm2
AV Area VTI: 4.35 cm2
AV Area mean vel: 3.57 cm2
AV Mean grad: 3 mmHg
AV Peak grad: 6.8 mmHg
Ao pk vel: 1.3 m/s
Area-P 1/2: 7.74 cm2
Calc EF: 62.3 %
Height: 68 in
MV VTI: 3.35 cm2
S' Lateral: 3.3 cm
Single Plane A2C EF: 66.5 %
Single Plane A4C EF: 56.3 %
Weight: 3920 [oz_av]

## 2024-08-06 LAB — CBC
HCT: 34.9 % — ABNORMAL LOW (ref 39.0–52.0)
Hemoglobin: 11.5 g/dL — ABNORMAL LOW (ref 13.0–17.0)
MCH: 30.8 pg (ref 26.0–34.0)
MCHC: 33 g/dL (ref 30.0–36.0)
MCV: 93.6 fL (ref 80.0–100.0)
Platelets: 316 K/uL (ref 150–400)
RBC: 3.73 MIL/uL — ABNORMAL LOW (ref 4.22–5.81)
RDW: 11.6 % (ref 11.5–15.5)
WBC: 8.9 K/uL (ref 4.0–10.5)
nRBC: 0 % (ref 0.0–0.2)

## 2024-08-06 LAB — HEMOGLOBIN A1C
Hgb A1c MFr Bld: 7.2 % — ABNORMAL HIGH (ref 4.8–5.6)
Mean Plasma Glucose: 159.94 mg/dL

## 2024-08-06 LAB — BASIC METABOLIC PANEL WITH GFR
Anion gap: 11 (ref 5–15)
BUN: 48 mg/dL — ABNORMAL HIGH (ref 6–20)
CO2: 23 mmol/L (ref 22–32)
Calcium: 8.7 mg/dL — ABNORMAL LOW (ref 8.9–10.3)
Chloride: 104 mmol/L (ref 98–111)
Creatinine, Ser: 3.39 mg/dL — ABNORMAL HIGH (ref 0.61–1.24)
GFR, Estimated: 21 mL/min — ABNORMAL LOW (ref >60)
Glucose, Bld: 180 mg/dL — ABNORMAL HIGH (ref 70–99)
Potassium: 4 mmol/L (ref 3.5–5.1)
Sodium: 138 mmol/L (ref 135–145)

## 2024-08-06 LAB — URINALYSIS, ROUTINE W REFLEX MICROSCOPIC
Bacteria, UA: NONE SEEN
Bilirubin Urine: NEGATIVE
Glucose, UA: >=500 mg/dL — AB
Hgb urine dipstick: NEGATIVE
Ketones, ur: NEGATIVE mg/dL
Leukocytes,Ua: NEGATIVE
Nitrite: NEGATIVE
Protein, ur: >=300 mg/dL — AB
Specific Gravity, Urine: 1.011 (ref 1.005–1.030)
pH: 6 (ref 5.0–8.0)

## 2024-08-06 LAB — TROPONIN I (HIGH SENSITIVITY)
Troponin I (High Sensitivity): 72 ng/L — ABNORMAL HIGH (ref <18)
Troponin I (High Sensitivity): 56 ng/L — ABNORMAL HIGH (ref <18)

## 2024-08-06 LAB — RENAL FUNCTION PANEL
Albumin: 2.6 g/dL — ABNORMAL LOW (ref 3.5–5.0)
Anion gap: 12 (ref 5–15)
BUN: 46 mg/dL — ABNORMAL HIGH (ref 6–20)
CO2: 24 mmol/L (ref 22–32)
Calcium: 8.3 mg/dL — ABNORMAL LOW (ref 8.9–10.3)
Chloride: 105 mmol/L (ref 98–111)
Creatinine, Ser: 3.47 mg/dL — ABNORMAL HIGH (ref 0.61–1.24)
GFR, Estimated: 21 mL/min — ABNORMAL LOW (ref >60)
Glucose, Bld: 169 mg/dL — ABNORMAL HIGH (ref 70–99)
Phosphorus: 3.6 mg/dL (ref 2.5–4.6)
Potassium: 3.7 mmol/L (ref 3.5–5.1)
Sodium: 141 mmol/L (ref 135–145)

## 2024-08-06 LAB — CBG MONITORING, ED: Glucose-Capillary: 168 mg/dL — ABNORMAL HIGH (ref 70–99)

## 2024-08-06 LAB — RAPID URINE DRUG SCREEN, HOSP PERFORMED
Amphetamines: NOT DETECTED
Barbiturates: NOT DETECTED
Benzodiazepines: NOT DETECTED
Cocaine: NOT DETECTED
Opiates: NOT DETECTED
Tetrahydrocannabinol: NOT DETECTED

## 2024-08-06 LAB — HEPATIC FUNCTION PANEL
ALT: 26 U/L (ref 0–44)
AST: 22 U/L (ref 15–41)
Albumin: 2.6 g/dL — ABNORMAL LOW (ref 3.5–5.0)
Alkaline Phosphatase: 133 U/L — ABNORMAL HIGH (ref 38–126)
Bilirubin, Direct: 0.1 mg/dL (ref 0.0–0.2)
Indirect Bilirubin: 0.7 mg/dL (ref 0.3–0.9)
Total Bilirubin: 0.8 mg/dL (ref 0.0–1.2)
Total Protein: 6.5 g/dL (ref 6.5–8.1)

## 2024-08-06 LAB — GLUCOSE, CAPILLARY
Glucose-Capillary: 169 mg/dL — ABNORMAL HIGH (ref 70–99)
Glucose-Capillary: 214 mg/dL — ABNORMAL HIGH (ref 70–99)

## 2024-08-06 LAB — BRAIN NATRIURETIC PEPTIDE: B Natriuretic Peptide: 241.1 pg/mL — ABNORMAL HIGH (ref 0.0–100.0)

## 2024-08-06 LAB — I-STAT CG4 LACTIC ACID, ED: Lactic Acid, Venous: 0.5 mmol/L (ref 0.5–1.9)

## 2024-08-06 LAB — MAGNESIUM: Magnesium: 2 mg/dL (ref 1.7–2.4)

## 2024-08-06 MED ORDER — SENNA 8.6 MG PO TABS
1.0000 | ORAL_TABLET | Freq: Two times a day (BID) | ORAL | Status: DC
  Administered 2024-08-06 – 2024-08-16 (×11): 8.6 mg via ORAL
  Filled 2024-08-06 (×20): qty 1

## 2024-08-06 MED ORDER — ALBUTEROL SULFATE (2.5 MG/3ML) 0.083% IN NEBU: 2.5000 mg | INHALATION_SOLUTION | Freq: Four times a day (QID) | RESPIRATORY_TRACT | Status: DC | PRN

## 2024-08-06 MED ORDER — SODIUM CHLORIDE 0.9 % IV SOLN
1.0000 g | Freq: Once | INTRAVENOUS | Status: DC
  Filled 2024-08-06: qty 10

## 2024-08-06 MED ORDER — INSULIN GLARGINE-YFGN 100 UNIT/ML ~~LOC~~ SOLN
25.0000 [IU] | Freq: Every day | SUBCUTANEOUS | Status: DC
  Administered 2024-08-06 – 2024-08-10 (×5): 25 [IU] via SUBCUTANEOUS
  Filled 2024-08-06 (×5): qty 0.25

## 2024-08-06 MED ORDER — APIXABAN 5 MG PO TABS
5.0000 mg | ORAL_TABLET | Freq: Two times a day (BID) | ORAL | Status: DC
  Administered 2024-08-06 – 2024-08-16 (×21): 5 mg via ORAL
  Filled 2024-08-06 (×21): qty 1

## 2024-08-06 MED ORDER — INSULIN ASPART 100 UNIT/ML IJ SOLN
0.0000 [IU] | Freq: Three times a day (TID) | INTRAMUSCULAR | Status: DC
  Administered 2024-08-06: 4 [IU] via SUBCUTANEOUS
  Administered 2024-08-07: 3 [IU] via SUBCUTANEOUS
  Administered 2024-08-07 (×2): 4 [IU] via SUBCUTANEOUS
  Administered 2024-08-08: 3 [IU] via SUBCUTANEOUS
  Administered 2024-08-08: 7 [IU] via SUBCUTANEOUS
  Administered 2024-08-09: 4 [IU] via SUBCUTANEOUS
  Administered 2024-08-09: 7 [IU] via SUBCUTANEOUS
  Administered 2024-08-09: 3 [IU] via SUBCUTANEOUS
  Administered 2024-08-10: 11 [IU] via SUBCUTANEOUS
  Administered 2024-08-10: 7 [IU] via SUBCUTANEOUS
  Administered 2024-08-10 – 2024-08-11 (×2): 3 [IU] via SUBCUTANEOUS
  Administered 2024-08-11 (×2): 4 [IU] via SUBCUTANEOUS
  Administered 2024-08-12: 7 [IU] via SUBCUTANEOUS
  Administered 2024-08-12 (×2): 4 [IU] via SUBCUTANEOUS
  Administered 2024-08-13: 7 [IU] via SUBCUTANEOUS
  Administered 2024-08-13: 3 [IU] via SUBCUTANEOUS
  Administered 2024-08-13 – 2024-08-14 (×4): 4 [IU] via SUBCUTANEOUS
  Administered 2024-08-15: 7 [IU] via SUBCUTANEOUS
  Administered 2024-08-15: 3 [IU] via SUBCUTANEOUS
  Administered 2024-08-15 – 2024-08-16 (×2): 4 [IU] via SUBCUTANEOUS
  Filled 2024-08-06: qty 4
  Filled 2024-08-06: qty 3
  Filled 2024-08-06: qty 7
  Filled 2024-08-06: qty 4
  Filled 2024-08-06: qty 11
  Filled 2024-08-06 (×3): qty 4
  Filled 2024-08-06: qty 7
  Filled 2024-08-06: qty 3
  Filled 2024-08-06 (×4): qty 4
  Filled 2024-08-06 (×2): qty 3
  Filled 2024-08-06: qty 7
  Filled 2024-08-06: qty 4
  Filled 2024-08-06: qty 3
  Filled 2024-08-06: qty 7
  Filled 2024-08-06 (×2): qty 4
  Filled 2024-08-06: qty 3
  Filled 2024-08-06: qty 6
  Filled 2024-08-06: qty 7
  Filled 2024-08-06: qty 4
  Filled 2024-08-06: qty 3
  Filled 2024-08-06: qty 4

## 2024-08-06 MED ORDER — INSULIN ASPART 100 UNIT/ML IJ SOLN
6.0000 [IU] | Freq: Three times a day (TID) | INTRAMUSCULAR | Status: DC
  Administered 2024-08-06 – 2024-08-16 (×28): 6 [IU] via SUBCUTANEOUS
  Filled 2024-08-06 (×27): qty 6

## 2024-08-06 MED ORDER — ACETAMINOPHEN 650 MG RE SUPP: 650.0000 mg | Freq: Four times a day (QID) | RECTAL | Status: DC | PRN

## 2024-08-06 MED ORDER — CARVEDILOL 12.5 MG PO TABS
12.5000 mg | ORAL_TABLET | Freq: Two times a day (BID) | ORAL | Status: DC
  Administered 2024-08-06 – 2024-08-09 (×6): 12.5 mg via ORAL
  Filled 2024-08-06 (×6): qty 1

## 2024-08-06 MED ORDER — SODIUM CHLORIDE 0.9 % IV SOLN
500.0000 mg | Freq: Once | INTRAVENOUS | Status: AC
  Administered 2024-08-06: 500 mg via INTRAVENOUS
  Filled 2024-08-06: qty 5

## 2024-08-06 MED ORDER — PANTOPRAZOLE SODIUM 40 MG PO TBEC
40.0000 mg | DELAYED_RELEASE_TABLET | Freq: Every day | ORAL | Status: DC
  Administered 2024-08-06 – 2024-08-16 (×11): 40 mg via ORAL
  Filled 2024-08-06 (×11): qty 1

## 2024-08-06 MED ORDER — FUROSEMIDE 10 MG/ML IJ SOLN
80.0000 mg | Freq: Once | INTRAMUSCULAR | Status: AC
  Administered 2024-08-06: 80 mg via INTRAVENOUS
  Filled 2024-08-06: qty 8

## 2024-08-06 MED ORDER — ALBUTEROL SULFATE (2.5 MG/3ML) 0.083% IN NEBU: 2.5000 mg | INHALATION_SOLUTION | RESPIRATORY_TRACT | Status: DC | PRN

## 2024-08-06 MED ORDER — TECHNETIUM TO 99M ALBUMIN AGGREGATED
4.0000 | Freq: Once | INTRAVENOUS | Status: AC | PRN
  Administered 2024-08-06: 4.4 via INTRAVENOUS

## 2024-08-06 MED ORDER — FUROSEMIDE 10 MG/ML IJ SOLN
60.0000 mg | Freq: Once | INTRAMUSCULAR | Status: AC
  Administered 2024-08-06: 60 mg via INTRAVENOUS
  Filled 2024-08-06: qty 6

## 2024-08-06 MED ORDER — SODIUM CHLORIDE 0.9 % IV SOLN
2.0000 g | Freq: Once | INTRAVENOUS | Status: AC
  Administered 2024-08-06: 2 g via INTRAVENOUS
  Filled 2024-08-06: qty 20

## 2024-08-06 MED ORDER — ACETAMINOPHEN 500 MG PO TABS
1000.0000 mg | ORAL_TABLET | Freq: Four times a day (QID) | ORAL | Status: DC | PRN
  Administered 2024-08-10: 1000 mg via ORAL
  Filled 2024-08-06: qty 2

## 2024-08-06 MED ORDER — INFLUENZA VIRUS VACC SPLIT PF (FLUZONE) 0.5 ML IM SUSY
0.5000 mL | PREFILLED_SYRINGE | INTRAMUSCULAR | Status: AC
  Administered 2024-08-12: 0.5 mL via INTRAMUSCULAR

## 2024-08-06 MED ORDER — ATORVASTATIN CALCIUM 80 MG PO TABS
80.0000 mg | ORAL_TABLET | Freq: Every day | ORAL | Status: DC
  Administered 2024-08-06 – 2024-08-15 (×10): 80 mg via ORAL
  Filled 2024-08-06 (×10): qty 1

## 2024-08-06 MED ORDER — DORZOLAMIDE HCL-TIMOLOL MAL 2-0.5 % OP SOLN
1.0000 [drp] | Freq: Two times a day (BID) | OPHTHALMIC | Status: DC
Start: 2024-08-06 — End: 2024-08-16
  Administered 2024-08-06 – 2024-08-16 (×19): 1 [drp] via OPHTHALMIC
  Filled 2024-08-06: qty 10

## 2024-08-06 MED ORDER — INSULIN ASPART 100 UNIT/ML IJ SOLN
0.0000 [IU] | Freq: Every day | INTRAMUSCULAR | Status: DC
  Administered 2024-08-06 – 2024-08-14 (×7): 2 [IU] via SUBCUTANEOUS
  Filled 2024-08-06 (×2): qty 2
  Filled 2024-08-06: qty 3
  Filled 2024-08-06 (×4): qty 2

## 2024-08-06 MED ORDER — HYDRALAZINE HCL 10 MG PO TABS
10.0000 mg | ORAL_TABLET | Freq: Three times a day (TID) | ORAL | Status: DC
  Administered 2024-08-06 – 2024-08-16 (×30): 10 mg via ORAL
  Filled 2024-08-06 (×30): qty 1

## 2024-08-06 MED ORDER — LATANOPROST 0.005 % OP SOLN
1.0000 [drp] | Freq: Every day | OPHTHALMIC | Status: DC
  Administered 2024-08-06 – 2024-08-15 (×10): 1 [drp] via OPHTHALMIC
  Filled 2024-08-06: qty 2.5

## 2024-08-06 MED ORDER — IPRATROPIUM-ALBUTEROL 0.5-2.5 (3) MG/3ML IN SOLN
3.0000 mL | Freq: Once | RESPIRATORY_TRACT | Status: AC
  Administered 2024-08-06: 3 mL via RESPIRATORY_TRACT
  Filled 2024-08-06: qty 3

## 2024-08-06 NOTE — ED Notes (Signed)
 Patient endorses being told he may have sleep apnea but no diagnosis he is aware of. MD notified of patient's desaturation during rest and oxygen application at 2L/min.

## 2024-08-06 NOTE — ED Notes (Signed)
Vasc at bedside.

## 2024-08-06 NOTE — ED Notes (Signed)
 Pt off on unit for imaging.

## 2024-08-06 NOTE — H&P (Addendum)
 Date: 08/06/2024               Patient Name:  Leroy Simmons MRN: [id number]  DOB: [birth year] Age / Sex: 53 y.o., male   PCP: Celestia Rosaline SQUIBB, NP         Medical Service: Internal Medicine Teaching Service         Attending Physician: Dr. Reyes Fenton      First Contact: Viktoria King, DO}    Second Contact: Dr. Damien Lease, DO         Pager Information: First Contact Pager: [phone]   Second Contact Pager: [phone]   SUBJECTIVE   Chief Complaint: Shortness of breath  History of Present Illness: Leroy Simmons is a 53 y.o. male with PMH of HFpEF, Left peroneal DVT in 2024, PE in 2025, uncontrolled DM, HTN, CKD4, presenting to the [facility] for worsening shortness of breath for the past week  Patient with worsening cough, chest pain, shortness of breath that started about a week ago. He has mild rhinorrhea and congestion about a week ago, none today. His main concerning symptoms if shortness of breath and heaviness in his body, which usually happens when he has too much fluid in his lungs. Currently denies chest pain at rest, when moving, or with deep breaths. He also endorses orthopnea and PND for the past week.  Patient has had decrease urine production in the past few weeks as well. No increased pressure with the start of the stream, dysuria, or polyuria. Has been urinating for about once per day in the past 2 days. Urine is about the same color.   He has been taking some of his medications, but does not know the names of them all. He tells me he often does not take a lot of them. Specifically asked about Torsemide , which he is prescribed 100 mg BID, has not taken for ~4 days this past week. Patient also reports that he was told he needed Eliquis  for ~6 months, which he had been taking intermittently until recently (~within the past month).Notes worsening lower extremity swelling, abdominal distension, and the breathing symptoms above. No recent travel.   Denies fevers, chills,  hemoptysis, nausea or vomiting. No dizziness or headaches.   Patient was evicted from his previous living arrangement yesterday. Slept in the car last night prior to coming in. Supported by his friend, Dorise, in the room with us .   Patient with elevated BNP, BUN and Cr. Given questionable adherence to Eliquis , V/Q scan ordered. CXR without consolidation and with evidence of pulmonary congestion. S/p 60 mg lasix . Admitted to IMTS for further management  Meds:  Patient reported:  Albuterol  - has with them Eliquis  5 mg BID - last taken last month Carvedilol  12.5 mg BID - not currently taking Hydralazine  10 mg TID, but mostly once a day Insulin  Glargine 52 - recently changed Short acting 8 TID with meals - recently changed Fluoxetine - not taking Eye drops; see dispensary Torsemide  100 mg BID - missing >4 times in the past week Will be starting on Ozempic in January  Current Meds  Medication Sig   albuterol  (VENTOLIN  HFA) 108 (90 Base) MCG/ACT inhaler Inhale 2 puffs into the lungs every 6 (six) hours as needed for wheezing or shortness of breath.   apixaban  (ELIQUIS ) 5 MG TABS tablet Take 1 tablet (5 mg total) by mouth 2 (two) times daily.   atorvastatin  (LIPITOR ) 80 MG tablet Take 1 tablet (80 mg total) by mouth daily. Stop the rosuvastatin .  carvedilol  (COREG ) 12.5 MG tablet Take 1 tablet (12.5 mg total) by mouth 2 (two) times daily with a meal.   dorzolamide -timolol  (COSOPT ) 2-0.5 % ophthalmic solution Place 1 drop into both eyes 2 (two) times daily.   FLUoxetine  (PROZAC ) 20 MG capsule Take 1 capsule (20 mg total) by mouth daily.   hydrALAZINE  (APRESOLINE ) 10 MG tablet Take 1 tablet (10 mg total) by mouth 3 (three) times daily.   insulin  glargine, 2 Unit Dial , (TOUJEO  MAX SOLOSTAR) 300 UNIT/ML Solostar Pen Inject 52 Units into the skin daily.   insulin  lispro (HUMALOG ) 100 UNIT/ML KwikPen Inject 8 Units into the skin 3 (three) times daily before meals.   latanoprost  (XALATAN ) 0.005 %  ophthalmic solution Administer 1 drop into the left eye nightly.   pantoprazole  (PROTONIX ) 40 MG tablet Take 1 tablet (40 mg total) by mouth daily.   torsemide  (DEMADEX ) 100 MG tablet Take 1 tablet (100 mg total) by mouth 2 (two) times daily.    Past Medical History Past Medical History:  Diagnosis Date   Acute gangrenous appendicitis 09/25/2022   Anxiety    CHF (congestive heart failure) (HCC)    Coronary artery disease    Depression    Diabetes mellitus without complication (HCC)    Hypertension      Past Surgical History Past Surgical History:  Procedure Laterality Date   INCISION AND DRAINAGE ABSCESS N/A 09/15/2023   Procedure: INCISION AND DRAINAGE OF BACK ABSCESS;  Surgeon: Belinda Cough, MD;  Location: [facility] OR;  Service: General;  Laterality: N/A;   LAPAROSCOPIC APPENDECTOMY N/A 09/26/2022   Procedure: APPENDECTOMY LAPAROSCOPIC;  Surgeon: Lane Shope, MD;  Location: [facility] ORS;  Service: General;  Laterality: N/A;   LEFT HEART CATH AND CORONARY ANGIOGRAPHY N/A 03/21/2022   Procedure: LEFT HEART CATH AND CORONARY ANGIOGRAPHY;  Surgeon: Dann Candyce RAMAN, MD;  Location: [facility] INVASIVE CV LAB;  Service: Cardiovascular;  Laterality: N/A;     [place]  [place] as he has been evicted.  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   OBJECTIVE:   Physical Exam: Blood pressure (!) 158/102, pulse 100, temperature 98.1 F (36.7 C), temperature source Oral, resp. rate (!) 24, height 5' 8 (1.727 m), weight 111.1 kg, SpO2 100%.  Constitutional:  well-appearing man sitting on edge of the bed, in no acute distress HENT: normocephalic atraumatic, mucous membranes moist.  Eyes: conjunctiva non-erythematous Neck: supple. JVP to midneck Cardiovascular: regular rate and rhythm, no m/r/g. Bilateral radial and PT pulses, symmetrical Pulmonary/Chest: normal work of breathing on RA, bilateral crackles. No wheezing Abdominal: soft, non-tender, distended. Edema to bilateral flanks MSK: 2+ edema above the knee, worst on the L > R.  Neurological: alert & oriented x 3, 5/5 strength in bilateral upper and lower extremities, normal gait Skin: warm and dry Psych: Pleasant mood and affect  Labs: CBC    Component Value Date/Time   WBC 8.9 08/06/2024 0146   RBC 3.73 (L) 08/06/2024 0146   HGB 11.5 (L) 08/06/2024 0146   HGB 13.2 04/25/2024 1607   HCT 34.9 (L)  08/06/2024 0146   HCT 41.3 04/25/2024 1607   PLT 316 08/06/2024 0146   PLT 259 04/25/2024 1607   MCV 93.6 08/06/2024 0146   MCV 94 04/25/2024 1607   MCH 30.8 08/06/2024 0146   MCHC 33.0 08/06/2024 0146   RDW 11.6 08/06/2024 0146   RDW 13.3 04/25/2024 1607   LYMPHSABS 2.1 04/25/2024 1607   MONOABS 0.8 01/09/2024 0534   EOSABS 0.2 04/25/2024 1607   BASOSABS 0.0 04/25/2024 1607     CMP     Component Value Date/Time   NA 138 08/06/2024 0146   NA 142 07/08/2024 1427   K 4.0 08/06/2024 0146   CL 104 08/06/2024 0146   CO2 23 08/06/2024 0146   GLUCOSE 180 (H) 08/06/2024 0146   BUN 48 (H) 08/06/2024 0146   BUN 40 (H) 07/08/2024 1427   CREATININE 3.39 (H) 08/06/2024 0146   CALCIUM  8.7 (L) 08/06/2024 0146   PROT 6.5 08/06/2024 0739   PROT 6.4 07/08/2024 1427   ALBUMIN  2.6 (L) 08/06/2024 0739   ALBUMIN  3.7 (L) 07/08/2024 1427   AST 22 08/06/2024 0739   ALT 26 08/06/2024 0739   ALKPHOS 133 (H) 08/06/2024 0739   BILITOT 0.8 08/06/2024 0739   BILITOT 0.5 07/08/2024 1427   GFRNONAA 21 (L) 08/06/2024 0146   GFRAA  10/26/2010 1143    >60        The eGFR has been calculated using the  MDRD equation. This calculation has not been validated in all clinical situations. eGFR's persistently <60 mL/min signify possible Chronic Kidney Disease.    Imaging:  ECHOCARDIOGRAM COMPLETE Result Date: 08/06/2024    ECHOCARDIOGRAM REPORT   Patient Name:   Leroy Simmons Date of Exam: 08/06/2024 Medical Rec #:  [id number]      Height:       68.0 in Accession #:    [id number]     Weight:       245.0 lb Date of Birth:  Nov 12, 1970      BSA:          2.228 m Patient Age:    52 years       BP:           158/102 mmHg Patient Gender: M              HR:           83 bpm. Exam Location:  Inpatient Procedure: 2D Echo (Both Spectral and Color Flow Doppler were utilized during            procedure). STAT ECHO Indications:    Dyspnea R06.00  History:        Patient has prior history of Echocardiogram examinations, most                 recent 01/10/2024. Cardiomegaly, CAD, Pulmonary HTN,                 Signs/Symptoms:Chest Pain and Dyspnea; Risk                 Factors:Hypertension, Diabetes and Hyperlipidemia.  Sonographer:    BERNARDA ROCKS Referring Phys: [id number] HAYLEY N NAASZ IMPRESSIONS  1. Left ventricular ejection fraction, by estimation, is 60 to 65%. The left ventricle has normal function. The left ventricle has no regional wall motion abnormalities. Indeterminate diastolic filling due to E-A fusion.  2. Right ventricular systolic function is normal. The right ventricular size is normal. Tricuspid regurgitation signal is inadequate for assessing PA pressure.  3. Left  atrial size was moderately dilated.  4. The mitral valve is normal in structure. No evidence of mitral valve regurgitation. No evidence of mitral stenosis.  5. The aortic valve was not well visualized. Aortic valve regurgitation is not visualized. No aortic stenosis is present.  6. The inferior vena cava is normal in size with greater than 50% respiratory variability, suggesting right atrial pressure of 3 mmHg. Comparison(s): No significant change  from prior study. Prior images reviewed side by side. FINDINGS  Left Ventricle: Left ventricular ejection fraction, by estimation, is 60 to 65%. The left ventricle has normal function. The left ventricle has no regional wall motion abnormalities. The left ventricular internal cavity size was normal in size. There is  no left ventricular hypertrophy. Indeterminate diastolic filling due to E-A fusion. Right Ventricle: The right ventricular size is normal. No increase in right ventricular wall thickness. Right ventricular systolic function is normal. Tricuspid regurgitation signal is inadequate for assessing PA pressure. Left Atrium: Left atrial size was moderately dilated. Right Atrium: Right atrial size was normal in size. Pericardium: There is no evidence of pericardial effusion. Mitral Valve: The mitral valve is normal in structure. No evidence of mitral valve regurgitation. No evidence of mitral valve stenosis. MV peak gradient, 9.5 mmHg. The mean mitral valve gradient is 3.0 mmHg. Tricuspid Valve: The tricuspid valve is normal in structure. Tricuspid valve regurgitation is not demonstrated. No evidence of tricuspid stenosis. Aortic Valve: The aortic valve was not well visualized. Aortic valve regurgitation is not visualized. No aortic stenosis is present. Aortic valve mean gradient measures 3.0 mmHg. Aortic valve peak gradient measures 6.8 mmHg. Aortic valve area, by VTI measures 4.35 cm. Pulmonic Valve: The pulmonic valve was normal in structure. Pulmonic valve regurgitation is not visualized. No evidence of pulmonic stenosis. Aorta: The aortic root and ascending aorta are structurally normal, with no evidence of dilitation. Venous: The inferior vena cava is normal in size with greater than 50% respiratory variability, suggesting right atrial pressure of 3 mmHg. IAS/Shunts: No atrial level shunt detected by color flow Doppler.  LEFT VENTRICLE PLAX 2D LVIDd:         5.50 cm      Diastology LVIDs:         3.30 cm       LV e' medial:    6.74 cm/s LV PW:         0.90 cm      LV E/e' medial:  22.0 LV IVS:        1.00 cm      LV e' lateral:   9.03 cm/s LVOT diam:     2.30 cm      LV E/e' lateral: 16.4 LV SV:         92 LV SV Index:   41 LVOT Area:     4.15 cm  LV Volumes (MOD) LV vol d, MOD A2C: 173.0 ml LV vol d, MOD A4C: 183.0 ml LV vol s, MOD A2C: 57.9 ml LV vol s, MOD A4C: 80.0 ml LV SV MOD A2C:     115.1 ml LV SV MOD A4C:     183.0 ml LV SV MOD BP:      115.6 ml RIGHT VENTRICLE RV Basal diam:  3.60 cm RV S prime:     12.60 cm/s TAPSE (M-mode): 2.1 cm LEFT ATRIUM              Index        RIGHT ATRIUM  Index LA diam:        5.00 cm  2.24 cm/m   RA Area:     13.70 cm LA Vol (A2C):   82.4 ml  36.99 ml/m  RA Volume:   27.50 ml  12.34 ml/m LA Vol (A4C):   109.0 ml 48.93 ml/m LA Biplane Vol: 97.2 ml  43.63 ml/m  AORTIC VALVE                    PULMONIC VALVE AV Area (Vmax):    4.22 cm     PV Vmax:          1.01 m/s AV Area (Vmean):   3.57 cm     PV Peak grad:     4.0 mmHg AV Area (VTI):     4.35 cm     PR End Diast Vel: 2.93 msec AV Vmax:           130.00 cm/s AV Vmean:          82.700 cm/s AV VTI:            0.211 m AV Peak Grad:      6.8 mmHg AV Mean Grad:      3.0 mmHg LVOT Vmax:         132.00 cm/s LVOT Vmean:        71.000 cm/s LVOT VTI:          0.221 m LVOT/AV VTI ratio: 1.05  AORTA Ao Root diam: 3.40 cm Ao Asc diam:  3.60 cm MITRAL VALVE MV Area (PHT): 7.74 cm     SHUNTS MV Area VTI:   3.35 cm     Systemic VTI:  0.22 m MV Peak grad:  9.5 mmHg     Systemic Diam: 2.30 cm MV Mean grad:  3.0 mmHg MV Vmax:       1.54 m/s MV Vmean:      77.6 cm/s MV Decel Time: 98 msec MV E velocity: 148.00 cm/s MV A velocity: 109.00 cm/s MV E/A ratio:  1.36 Stanly Leavens MD Electronically signed by Stanly Leavens MD Signature Date/Time: 08/06/2024/12:04:35 PM    Final    DG Chest 2 View Result Date: 08/06/2024 CLINICAL DATA:  Shortness of breath. EXAM: CHEST - 2 VIEW COMPARISON:  January 09, 2024 FINDINGS: The  cardiac silhouette is mildly enlarged and unchanged in size. There is prominence of the pulmonary vasculature with mild, diffusely increased interstitial lung markings noted. No focal consolidation, pleural effusion or pneumothorax is identified. The visualized skeletal structures are unremarkable. IMPRESSION: Mild cardiomegaly with mild pulmonary vascular congestion. Electronically Signed   By: Suzen Dials M.D.   On: 08/06/2024 02:56     EKG: personally reviewed my interpretation is NSR with LVH. Prior EKG 01/2024  ASSESSMENT & PLAN:   Assessment & Plan by Problem: Principal Problem:   Acute on chronic diastolic heart failure with preserved ejection fraction (HCC)  Acute on chronic diastolic HF Suspect in setting of new PNA vs medication nonadherence. No ischemic signs on EKG. Troponin peaked at 56. Patient endorses intermittent use of the his Torsemide . He also has worsening renal function; unclear if it is the culprit for the acute decompensation or a result of it. Significantly overloaded on exam. Congestion on CXR. BNP 241. Last TTE 01/2024 with LVEF 60 - 65% without wall motion abnormalities with LA dilation. No lactic acidosis and well perfused on exam. Currently on 2L [place] - S/p 60 mg Lasix  IV in the ED; minimal  UOP  - Will need to assess UOP by mid afternoon  - Will re-dose lasix  at higher dose  - Follow up BMP this afternoon - Strict in/outs - Daily standing weights  Acute Hypoxic respiratory failure Hx of PE 2025 Hx L peroneal DVT in 2024 ?CAP Likely in setting of pulmonary congestion. However, patient with hx of PE in 01/2024. S/p 6 months of Eliquis . PERC 2+. Moderate risk factor for PE. Unclear the etiology of prior PE. However, since 01/2024 was the second episode of thrombotic event, patient was to be on lifetime anticoagulation. Finished course of 6 months of Eliquis  last month. No overt consolidation on CXR to suspect PNA. RVP negative on admission. Suspect cough and dyspnea  in setting of volume overload.  - V/Q exam this admission - Follow up TTE for signs of right heart strain - Will resume Eliquis  for now - Hematology follow up outpatient - Expectorated sputum - S/p CTX and Azithromycin    - Will continue for now - Follow fever curve - ambulatory pulse ox, incentive spirometer, PT/OT  L>R leg edema, new Suspect in setting of acute HFpEF, however, given size discrepancy and hx of DVT will further evaluate - VAS US  LE bilaterally - On AC as above  CKD4 Proteinuria Worsened kidney function since it was checked 4 weeks ago at PCP BUN 40, Cr 2.74 and GFR 27. Here: Cr 3.39 BUN 48, and . Patient was to follow up with [facility] after last hospitalization in April of  2025. High risk for nephrotic syndrome given anasarca and hyperlipidemia.  - Strict in/out - Weight daily - Bladder scan post void residual: 0mL - Monitor BUN this admission - Monitor K - Urine prot creatinine ratio  Hypertension Uncontrolled during this admission - Restarted Hydralazine  10 mg TID - May need IMDUR  during this admission if symptomatic while uncontrolled  OSA suspected Patient awaiting sleep studies. Desaturating here while sleeping - CPAP at night  Asthma - PRN albuterol    CAD Hyperlipidemia Left heart cath 03/2022 that revealed 50% D2, 40% mid LAD, and 25% ramus lesions. Previously on Rosuvastatin  and Carvedilol , not dispensed in the past month or so. No signs of ischemia or chest pain\ - Restart Rosuvastatin  40 mg daily - Restart Carvedilol   Diabetes mellitus on chronic insulin  use PTA on 52 Glargine and Aspart 8 units TID - Restarted 25 units semglee  and 6 units TID with meals  GERD - Continued PPI  Elevated Alkaline phosphatase No hepatic enzyme elevation - stable from prior - Will work up as outpatient  Normocytic anemia Suspect anemia of CKD Hgb 11.5 today - Iron studies - B12  Obesity, BMI 37 - Patient is about to start of GLP1 RA  Anticoagulation:  Eliquis  Reason: Prior VTE and PE and for prophylaxis  SDOH TOC consulted for housing information  Best practice: Diet: Heart Healthy VTE: DOAC Code: Full  Disposition planning: Prior to Admission Living Arrangement: Home, living alone Anticipated Discharge Location: pending PT and OT  Dispo: Admit patient to Observation with expected length of stay less than 2 midnights.  Signed: Elnora Ip, MD Internal Medicine Resident  08/06/2024, 12:46 PM  On Call pager: [phone]

## 2024-08-06 NOTE — ED Triage Notes (Signed)
 Pt arrived from home via POV c/o sob x 5 days that is continuously getting worse. Pt noted to have edematous BLE. Hx PE in last year

## 2024-08-06 NOTE — Plan of Care (Signed)
   Problem: Education: Goal: Knowledge of General Education information will improve Description: Including pain rating scale, medication(s)/side effects and non-pharmacologic comfort measures Outcome: Progressing   Problem: Clinical Measurements: Goal: Respiratory complications will improve Outcome: Progressing   Problem: Activity: Goal: Risk for activity intolerance will decrease Outcome: Progressing

## 2024-08-06 NOTE — ED Notes (Signed)
Pt transported to nuclear medicine. 

## 2024-08-06 NOTE — ED Notes (Signed)
 This pt told triage staff that he is newly homeless. Stated he was evicted today, Triage RN aware

## 2024-08-06 NOTE — Hospital Course (Addendum)
 Leroy Simmons, 53 yo male, PMHx of HFpEF, PE in 2025, DVT in 2024, CKD4, DMT2 presented on 11/4 with one week of worsening SOB, CP and URI symptoms and admitted for acute on chronic heart failure exacerbation.   Acute on chronic diastolic HF On admission, no ischemic signs on EKG. Troponin peaked at 56. Congestion on CXR. BNP 241. Last TTE 01/2024 with LVEF 60 - 65% without wall motion abnormalities with LA dilation. Patient endorsed intermittent use of the his Torsemide . Covid and flu negative. Suspect exacerbated by medication non adherence and worsening kidney disease. This admission, TTE LVEF 60-65% and LV w/o RMWA. Ferritin 116, Sat ratios 12, given 500 mg IV Iron on 11/4. Patient was aggressively diuresed with total UOP since admission *** and weight down by *** lbs. RHC on 11/11 showed RA pressure 4 suggesting the hypervolemia is from his kidney disease. GDMT limited due to Cr, continued on hydralazine  10 mg TID and carvedilol  25 mg BID inpatient, will discharge on ***. Has outpatient follow up with Dr. Laurence team outpatient.   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 Historically followed with [facility]. On admission, Cr 3.47 BUN 46, GFR 21. In 01/2024, urine protein creatinine was 9.96, repeat on admission result outside reportable range. Vit D 25 15.30, PTH 112. Cr and BUN up trended while GFR down trended throughout admission likely due to IV Lasix  and one day of entresto. Care team discussed with patient the likelihood of him needing dialysis in the future. While admitted, he had no signs of uremia and no acute need for HD. Cr plateaued at *** on ***. Started on Sevelamer 800 mg TID while here. Will discharge on ***   Acute Hypoxic respiratory failure, resolved No overt consolidation on CXR to suspect PNA. RVP negative on admission. V/Q scan negative for PE. Cough and dyspnea resolved with diuresis. Breathing well on  room air and walked with mobility while in hospital everyday.   Hypercoagulable, VTE Hx Patient with hx of DVT in 2024 and PE in 01/2024. Finished course of 6 months of Eliquis  last month. However, because the PE 01/2024 was the second episode of thrombotic event, patient was to be on lifetime anticoagulation.  PERC 2+ on admission. Moderate risk factor for PE. V/Q scan on admission negative for PE.  BL Vas US  on admission c/w subcutaneous edema and w/o evidence of DVT. Continued on Eliquis  5 mg BID in hospital and on discharge. ***   Hypertension Increased his home carvedilol  to 25 mg BID and BP remained stable throughout admission on carvedilol  and hydralazine  10 mg TID. Will discharge on this regimen. ***   Diabetes mellitus on chronic insulin  PTA on 52 Glargine and Aspart 8 units TID. A1c on admission 7.2. Throughout admission, sugars controlled on 28-32 units glargine at bedtime and 6 units aspart with meals. Fasting blood glucose *** on day of discharge. Will discharge home on *** and close follow up with PCP.   Normocytic anemia Hgb 11.5 on admission, likely due to kidney disease. Fe 28, Saturation ratios 12, TIBC 242 this admission. Gave IV Iron 11/5. Hgb *** on day of discharge and stable.   Non obstructive CAD Hyperlipidemia Left heart cath 03/2022 that revealed 50% D2, 40% mid LAD, and 25% ramus lesions. Presenting chest pain resolved with diuresis. Continue home rosuvastatin  40 mg daily and increased carvedilol  25 mg BID on discharge ***.  OSA suspected Follows with pulmonology outpatient and is being worked up for excessive daytime sleepiness  with home sleep test. He did not wear CPAP while in hospital.    GERD Continue pantoprazole  40 mg daily.    Elevated Alkaline phosphatase Alk phos 133 on admission and, per chart, stable from prior two hepatic function panels. No hepatic enzyme elevation.    SDOH TOC consulted for housing information, met with him and provided resources on  11/7. ***

## 2024-08-06 NOTE — Medical Student Note (Incomplete)
 [facility]-EMERGENCY DEPT Provider Student Note For educational purposes for Medical, PA and NP students only and not part of the legal medical record.   CSN: [id number] Arrival date & time: 08/06/24  0121      History   Chief Complaint Chief Complaint  Patient presents with  . Shortness of Breath    HPI Leroy Simmons is a 53 y.o. male with PMH of CHF, CKD 3a, hx of PE, asthma, diabetes presents to the ED with 6 day history of cough, chest pain, SOB, malaise, subjective fever, rhinorrhea and congestion. Pt denies N/V/D, urinary changes. He states that he has only been taking cough drops which have not helped. Pt has also noticed bilateral leg swelling that have been getting progressively worse. Pt states that he has not taken his eliquis  and torsemide  for few days. Pt denies smoking, alcohol, illicit drug use.    Shortness of Breath Associated symptoms: cough and headaches   Associated symptoms: no abdominal pain and no wheezing     Past Medical History:  Diagnosis Date  . Acute gangrenous appendicitis 09/25/2022  . Anxiety   . CHF (congestive heart failure) (HCC)   . Coronary artery disease   . Depression   . Diabetes mellitus without complication (HCC)   . Hypertension     Patient Active Problem List   Diagnosis Date Noted  . Acute respiratory failure with hypoxia (HCC) 01/12/2024  . CKD (chronic kidney disease) stage 4, GFR 15-29 ml/min (HCC) 01/09/2024  . Pulmonary embolism (HCC) 01/09/2024  . Abscess of upper back excluding scapular region 09/18/2023  . Cellulitis 09/14/2023  . Leukocytosis 09/14/2023  . Anemia of chronic disease 09/14/2023  . Protein calorie malnutrition 09/14/2023  . History of DVT (deep vein thrombosis) 09/14/2023  . AKI (acute kidney injury) 03/16/2023  . Sepsis due to cellulitis (HCC) 03/13/2023  . Chronic kidney disease, stage 3a (HCC) 03/13/2023  . Pulmonary HTN (HCC) 02/08/2023  . Deep venous thrombosis (DVT) of left peroneal vein (HCC)  01/20/2023  . NSTEMI (non-ST elevated myocardial infarction) (HCC) 01/18/2023  . Anasarca 01/18/2023  . Hepatitis 01/18/2023  . Status post laparoscopic appendectomy 10/06/2022  . Obesity (BMI 30-39.9) 09/26/2022  . Pre-operative cardiovascular examination 09/26/2022  . Heart failure with preserved ejection fraction (HCC) 09/26/2022  . Hypertensive urgency 09/25/2022  . Uncontrolled type 2 diabetes mellitus with hyperglycemia, with long-term current use of insulin  (HCC) 09/25/2022  . Acute on chronic diastolic CHF (congestive heart failure) (HCC) 06/02/2022  . Left lower lobe pulmonary nodule 06/02/2022  . Acute kidney injury superimposed on CKD 03/18/2022  . Coronary artery disease involving native coronary artery of native heart without angina pectoris 03/18/2022  . Acute exacerbation of CHF (congestive heart failure) (HCC) 03/18/2022  . Cellulitis of right lower extremity   . CHF exacerbation (HCC) 03/17/2022  . Anxiety and depression 03/10/2022  . Cardiomegaly 01/18/2022  . Diabetes mellitus (HCC) 12/07/2021  . Hyperlipidemia associated with type 2 diabetes mellitus (HCC) 12/07/2021  . Pain of both shoulder joints 10/22/2021  . Cellulitis of back except buttock 10/01/2020  . Asthma 10/26/2019  . Cellulitis of left heel 10/26/2019  . Chest pain 10/26/2019  . Left foot pain 10/26/2019  . Cellulitis and abscess of neck 07/13/2019  . Abscess of back, except buttock 05/30/2019  . Hypokalemia 09/07/2009  . Morbid obesity (HCC) 09/07/2009  . Anxiety 09/07/2009  . Depression 09/07/2009  . Hypertension associated with diabetes (HCC) 09/07/2009  . Asthma 09/07/2009  . DIARRHEA, CHRONIC 09/07/2009  Past Surgical History:  Procedure Laterality Date  . INCISION AND DRAINAGE ABSCESS N/A 09/15/2023   Procedure: INCISION AND DRAINAGE OF BACK ABSCESS;  Surgeon: Belinda Cough, MD;  Location: [facility] OR;  Service: General;  Laterality: N/A;  . LAPAROSCOPIC APPENDECTOMY N/A 09/26/2022    Procedure: APPENDECTOMY LAPAROSCOPIC;  Surgeon: Lane Shope, MD;  Location: [facility] ORS;  Service: General;  Laterality: N/A;  . LEFT HEART CATH AND CORONARY ANGIOGRAPHY N/A 03/21/2022   Procedure: LEFT HEART CATH AND CORONARY ANGIOGRAPHY;  Surgeon: Dann Candyce RAMAN, MD;  Location: [facility] INVASIVE CV LAB;  Service: Cardiovascular;  Laterality: N/A;       Home Medications    Prior to Admission medications   Medication Sig Start Date End Date Taking? Authorizing Provider  albuterol  (VENTOLIN  HFA) 108 (90 Base) MCG/ACT inhaler Inhale 2 puffs into the lungs every 6 (six) hours as needed for wheezing or shortness of breath. 05/27/24   Delbert Clam, MD  apixaban  (ELIQUIS ) 5 MG TABS tablet Take 1 tablet (5 mg total) by mouth 2 (two) times daily. 05/27/24   Newlin, Enobong, MD  atorvastatin  (LIPITOR ) 80 MG tablet Take 1 tablet (80 mg total) by mouth daily. Stop the rosuvastatin . 07/23/24   Newlin, Enobong, MD  blood glucose meter kit and supplies Dispense based on patient and insurance preference. Use up to four times daily as directed. (FOR ICD-10 E10.9, E11.9). 05/24/22   de [place], Quintin PARAS, MD  carvedilol  (COREG ) 12.5 MG tablet Take 1 tablet (12.5 mg total) by mouth 2 (two) times daily with a meal. 05/27/24   Delbert Clam, MD  Continuous Glucose Receiver (FREESTYLE LIBRE 3 READER) DEVI Use to check blood sugar continuously. 07/08/24   Newlin, Enobong, MD  Continuous Glucose Sensor (FREESTYLE LIBRE 3 PLUS SENSOR) MISC Change sensor every 15 days. Use to check blood sugar continuously. 07/08/24   Newlin, Enobong, MD  dorzolamide -timolol  (COSOPT ) 2-0.5 % ophthalmic solution Place 1 drop into both eyes 2 (two) times daily. 02/27/24     dorzolamide -timolol  (COSOPT ) 2-0.5 % ophthalmic solution Place 1 drop into both eyes 2 (two) times daily. 06/10/24     FLUoxetine  (PROZAC ) 20 MG capsule Take 1 capsule (20 mg total) by mouth daily. 06/20/24   Newlin, Enobong, MD  hydrALAZINE  (APRESOLINE ) 10 MG tablet Take 1  tablet (10 mg total) by mouth 3 (three) times daily. 06/12/24   Celestia Rosaline SQUIBB, NP  insulin  glargine, 2 Unit Dial , (TOUJEO  MAX SOLOSTAR) 300 UNIT/ML Solostar Pen Inject 52 Units into the skin daily. 05/27/24   Newlin, Enobong, MD  insulin  lispro (HUMALOG ) 100 UNIT/ML KwikPen Inject 8 Units into the skin 3 (three) times daily before meals. 05/27/24   Newlin, Enobong, MD  Insulin  Pen Needle 32G X 4 MM MISC Use with insulin  05/27/24   Newlin, Enobong, MD  latanoprost  (XALATAN ) 0.005 % ophthalmic solution Administer 1 drop into the left eye nightly. 02/15/24     latanoprost  (XALATAN ) 0.005 % ophthalmic solution Place 1 drop into both eyes at bedtime. 02/27/24     latanoprost  (XALATAN ) 0.005 % ophthalmic solution Place 1 drop into both eyes at bedtime. 06/10/24     pantoprazole  (PROTONIX ) 40 MG tablet Take 1 tablet (40 mg total) by mouth daily. 05/27/24   Newlin, Enobong, MD  Semaglutide,0.25 or 0.5MG /DOS, (OZEMPIC, 0.25 OR 0.5 MG/DOSE,) 2 MG/3ML SOPN Inject 0.25 mg into the skin once a week. For 4 weeks. Then, increase to 0.5 mg weekly thereafter. 07/10/24   Newlin, Enobong, MD  torsemide  (DEMADEX ) 100 MG tablet Take  1 tablet (100 mg total) by mouth 2 (two) times daily. 05/27/24   Delbert Clam, MD    Family History Family History  Problem Relation Age of Onset  . Heart failure Mother     Social History Social History   Tobacco Use  . Smoking status: Never  . Smokeless tobacco: Never  Vaping Use  . Vaping status: Never Used  Substance Use Topics  . Alcohol use: Yes    Comment: rarely  . Drug use: Never     Allergies   Patient has no known allergies.   Review of Systems Review of Systems  Constitutional:  Positive for chills and fatigue.  HENT:  Positive for congestion.   Respiratory:  Positive for cough, chest tightness and shortness of breath. Negative for wheezing.   Cardiovascular:  Positive for leg swelling.  Gastrointestinal:  Negative for abdominal pain, diarrhea and nausea.   Genitourinary:  Negative for difficulty urinating and dysuria.  Musculoskeletal:  Positive for myalgias.  Neurological:  Positive for headaches.     Physical Exam Updated Vital Signs BP (!) 167/104   Pulse 91   Temp 98.2 F (36.8 C)   Resp 16   Ht 5' 8 (1.727 m)   Wt 111.1 kg   SpO2 99%   BMI 37.25 kg/m   Physical Exam Cardiovascular:     Rate and Rhythm: Normal rate.  Pulmonary:     Effort: Pulmonary effort is normal. No accessory muscle usage or respiratory distress.     Breath sounds: Decreased breath sounds present. No wheezing.  Chest:     Chest wall: Tenderness present.  Musculoskeletal:     Right lower leg: Edema (3 + pitting edema) present.     Left lower leg: Edema (3 + pitting edema) present.  Skin:    General: Skin is warm.      ED Treatments / Results  Labs (all labs ordered are listed, but only abnormal results are displayed) Labs Reviewed  BASIC METABOLIC PANEL WITH GFR - Abnormal; Notable for the following components:      Result Value   Glucose, Bld 180 (*)    BUN 48 (*)    Creatinine, Ser 3.39 (*)    Calcium  8.7 (*)    GFR, Estimated 21 (*)    All other components within normal limits  CBC - Abnormal; Notable for the following components:   RBC 3.73 (*)    Hemoglobin 11.5 (*)    HCT 34.9 (*)    All other components within normal limits  BRAIN NATRIURETIC PEPTIDE - Abnormal; Notable for the following components:   B Natriuretic Peptide 241.1 (*)    All other components within normal limits  RESP PANEL BY RT-PCR (RSV, FLU A&B, COVID)  RVPGX2  HEPATIC FUNCTION PANEL  TROPONIN I (HIGH SENSITIVITY)    EKG  Radiology DG Chest 2 View Result Date: 08/06/2024 CLINICAL DATA:  Shortness of breath. EXAM: CHEST - 2 VIEW COMPARISON:  January 09, 2024 FINDINGS: The cardiac silhouette is mildly enlarged and unchanged in size. There is prominence of the pulmonary vasculature with mild, diffusely increased interstitial lung markings noted. No focal  consolidation, pleural effusion or pneumothorax is identified. The visualized skeletal structures are unremarkable. IMPRESSION: Mild cardiomegaly with mild pulmonary vascular congestion. Electronically Signed   By: Suzen Dials M.D.   On: 08/06/2024 02:56    Procedures Procedures (including critical care time)  Medications Ordered in ED Medications  furosemide  (LASIX ) injection 60 mg (has no administration in time  range)  ipratropium-albuterol  (DUONEB) 0.5-2.5 (3) MG/3ML nebulizer solution 3 mL (3 mLs Nebulization Given 08/06/24 0744)     Initial Impression / Assessment and Plan / ED Course  I have reviewed the triage vital signs and the nursing notes.  Pertinent labs & imaging results that were available during my care of the patient were reviewed by me and considered in my medical decision making (see chart for details).     ***  Final Clinical Impressions(s) / ED Diagnoses   Final diagnoses:  None    New Prescriptions New Prescriptions   No medications on file

## 2024-08-06 NOTE — ED Notes (Signed)
 Patient endorses cough with associated shob and CP. PA notified.

## 2024-08-06 NOTE — ED Provider Notes (Signed)
 [place] EMERGENCY DEPARTMENT AT [facility] Provider Note   CSN: [id number] Arrival date & time: 08/06/24  0121     Patient presents with: Shortness of Breath   Leroy Simmons is a 53 y.o. male with h/o CHF, CKD, hx of PE prescribed Eliquis , asthma, diabetes presents to the ED with 6 day history of cough, chest pain, SOB, malaise, subjective fever, rhinorrhea and congestion. He describes the cough as dry and intermittent, no hemoptysis. He does have some diffuse lower chest pain with coughing. Pt denies N/V/D, urinary changes. He states that he has only been taking cough drops which have not helped. Pt has also noticed bilateral leg swelling that have been getting progressively worse. The patient has been non compliant with medications for a while.  Pt denies smoking, alcohol, illicit drug use.    Shortness of Breath Associated symptoms: chest pain, cough and fever (subjective)   Associated symptoms: no abdominal pain and no vomiting        Prior to Admission medications   Medication Sig Start Date End Date Taking? Authorizing Provider  albuterol  (VENTOLIN  HFA) 108 (90 Base) MCG/ACT inhaler Inhale 2 puffs into the lungs every 6 (six) hours as needed for wheezing or shortness of breath. 05/27/24   Delbert Clam, MD  apixaban  (ELIQUIS ) 5 MG TABS tablet Take 1 tablet (5 mg total) by mouth 2 (two) times daily. 05/27/24   Newlin, Enobong, MD  atorvastatin  (LIPITOR ) 80 MG tablet Take 1 tablet (80 mg total) by mouth daily. Stop the rosuvastatin . 07/23/24   Newlin, Enobong, MD  blood glucose meter kit and supplies Dispense based on patient and insurance preference. Use up to four times daily as directed. (FOR ICD-10 E10.9, E11.9). 05/24/22   [place], Quintin PARAS, MD  carvedilol  (COREG ) 12.5 MG tablet Take 1 tablet (12.5 mg total) by mouth 2 (two) times daily with a meal. 05/27/24   Delbert Clam, MD  Continuous Glucose Receiver (FREESTYLE LIBRE 3 READER) DEVI Use to check blood sugar  continuously. 07/08/24   Newlin, Enobong, MD  Continuous Glucose Sensor (FREESTYLE LIBRE 3 PLUS SENSOR) MISC Change sensor every 15 days. Use to check blood sugar continuously. 07/08/24   Newlin, Enobong, MD  dorzolamide -timolol  (COSOPT ) 2-0.5 % ophthalmic solution Place 1 drop into both eyes 2 (two) times daily. 02/27/24     dorzolamide -timolol  (COSOPT ) 2-0.5 % ophthalmic solution Place 1 drop into both eyes 2 (two) times daily. 06/10/24     FLUoxetine  (PROZAC ) 20 MG capsule Take 1 capsule (20 mg total) by mouth daily. 06/20/24   Newlin, Enobong, MD  hydrALAZINE  (APRESOLINE ) 10 MG tablet Take 1 tablet (10 mg total) by mouth 3 (three) times daily. 06/12/24   Celestia Rosaline SQUIBB, NP  insulin  glargine, 2 Unit Dial , (TOUJEO  MAX SOLOSTAR) 300 UNIT/ML Solostar Pen Inject 52 Units into the skin daily. 05/27/24   Newlin, Enobong, MD  insulin  lispro (HUMALOG ) 100 UNIT/ML KwikPen Inject 8 Units into the skin 3 (three) times daily before meals. 05/27/24   Newlin, Enobong, MD  Insulin  Pen Needle 32G X 4 MM MISC Use with insulin  05/27/24   Newlin, Enobong, MD  latanoprost  (XALATAN ) 0.005 % ophthalmic solution Administer 1 drop into the left eye nightly. 02/15/24     latanoprost  (XALATAN ) 0.005 % ophthalmic solution Place 1 drop into both eyes at bedtime. 02/27/24     latanoprost  (XALATAN ) 0.005 % ophthalmic solution Place 1 drop into both eyes at bedtime. 06/10/24     pantoprazole  (PROTONIX ) 40 MG tablet  Take 1 tablet (40 mg total) by mouth daily. 05/27/24   Newlin, Enobong, MD  Semaglutide,0.25 or 0.5MG /DOS, (OZEMPIC, 0.25 OR 0.5 MG/DOSE,) 2 MG/3ML SOPN Inject 0.25 mg into the skin once a week. For 4 weeks. Then, increase to 0.5 mg weekly thereafter. 07/10/24   Newlin, Enobong, MD  torsemide  (DEMADEX ) 100 MG tablet Take 1 tablet (100 mg total) by mouth 2 (two) times daily. 05/27/24   Newlin, Enobong, MD    Allergies: Patient has no known allergies.    Review of Systems  Constitutional:  Positive for fever (subjective).   HENT:  Positive for congestion and rhinorrhea.   Respiratory:  Positive for cough and shortness of breath.   Cardiovascular:  Positive for chest pain and leg swelling.  Gastrointestinal:  Negative for abdominal pain, nausea and vomiting.  Genitourinary:  Negative for difficulty urinating.    Updated Vital Signs BP (!) 167/104   Pulse 91   Temp 98.2 F (36.8 C)   Resp 16   Ht 5' 8 (1.727 m)   Wt 111.1 kg   SpO2 99%   BMI 37.25 kg/m   Physical Exam Vitals and nursing note reviewed.  Constitutional:      Comments: Disheveled, uncomfortable  HENT:     Nose:     Comments: Bilateral nasal turbinate edema and erythema with clear nasal discharge Cardiovascular:     Rate and Rhythm: Normal rate.  Pulmonary:     Effort: Pulmonary effort is normal. No respiratory distress.     Breath sounds: Normal breath sounds. No decreased breath sounds.     Comments: Clear to auscultation bilaterally.  Speaking in full sentences with ease, satting well room air without increased work of breathing. Chest:     Chest wall: Tenderness present.     Comments: Diffuse chest wall tenderness palpation. Abdominal:     Palpations: Abdomen is soft.     Tenderness: There is no abdominal tenderness.  Musculoskeletal:     Right lower leg: Edema present.     Left lower leg: Edema present.     Comments: 3+ pitting edema bilaterally.  Coloration, temperature, and size appear and feel symmetric bilaterally.  Skin:    General: Skin is warm and dry.  Neurological:     Mental Status: He is alert.     (all labs ordered are listed, but only abnormal results are displayed) Labs Reviewed  BASIC METABOLIC PANEL WITH GFR - Abnormal; Notable for the following components:      Result Value   Glucose, Bld 180 (*)    BUN 48 (*)    Creatinine, Ser 3.39 (*)    Calcium  8.7 (*)    GFR, Estimated 21 (*)    All other components within normal limits  CBC - Abnormal; Notable for the following components:   RBC 3.73  (*)    Hemoglobin 11.5 (*)    HCT 34.9 (*)    All other components within normal limits  BRAIN NATRIURETIC PEPTIDE - Abnormal; Notable for the following components:   B Natriuretic Peptide 241.1 (*)    All other components within normal limits  HEPATIC FUNCTION PANEL - Abnormal; Notable for the following components:   Albumin  2.6 (*)    Alkaline Phosphatase 133 (*)    All other components within normal limits  TROPONIN I (HIGH SENSITIVITY) - Abnormal; Notable for the following components:   Troponin I (High Sensitivity) 72 (*)    All other components within normal limits  RESP PANEL BY RT-PCR (  RSV, FLU A&B, COVID)  RVPGX2  TROPONIN I (HIGH SENSITIVITY)    EKG: EKG Interpretation Date/Time:  Tuesday August 06 2024 01:40:03 EST Ventricular Rate:  98 PR Interval:  138 QRS Duration:  70 QT Interval:  368 QTC Calculation: 469 R Axis:   10  Text Interpretation: Normal sinus rhythm Minimal voltage criteria for LVH, may be normal variant ( R in aVL ) Anterior infarct , age undetermined Artifact Confirmed by Franklyn Gills [phone]) on 08/06/2024 7:15:04 AM  Radiology: DG Chest 2 View Result Date: 08/06/2024 CLINICAL DATA:  Shortness of breath. EXAM: CHEST - 2 VIEW COMPARISON:  January 09, 2024 FINDINGS: The cardiac silhouette is mildly enlarged and unchanged in size. There is prominence of the pulmonary vasculature with mild, diffusely increased interstitial lung markings noted. No focal consolidation, pleural effusion or pneumothorax is identified. The visualized skeletal structures are unremarkable. IMPRESSION: Mild cardiomegaly with mild pulmonary vascular congestion. Electronically Signed   By: Suzen Dials M.D.   On: 08/06/2024 02:56   Procedures   Medications Ordered in the ED  ipratropium-albuterol  (DUONEB) 0.5-2.5 (3) MG/3ML nebulizer solution 3 mL (3 mLs Nebulization Given 08/06/24 0744)  furosemide  (LASIX ) injection 60 mg (60 mg Intravenous Given 08/06/24 9188)    Medical  Decision Making Amount and/or Complexity of Data Reviewed Labs: ordered. Radiology: ordered.  Risk Prescription drug management. Decision regarding hospitalization.   53 y.o. male presents to the ER for evaluation of SOB, cough, leg swelling. Differential diagnosis includes but is not limited to CHF, pericardial effusion/tamponade, arrhythmias, ACS, COPD, asthma, bronchitis, pneumonia, pneumothorax, PE, anemia. Vital signs elevated BP otherwise unremarkable. Physical exam as noted above.   The patient has not been on any of his daily medications in some time. Concern for PE given history of noncompliance with medications however given patient's kidney function, cannot proceed with contrasted study.  He has had previous VQ scan in April 2025. The patient has 3+ pitting edema to his lower extremities.  His lung sounds are clear however the patient does appear to be hypervolemic.  This to be on torsemide  however has been noncompliant.  Will order him IV Lasix .  Will continue with workup.   I independently reviewed and interpreted the patient's labs.  CBC shows anemia with hemoglobin 11.5, patient's baseline.  No leukocytosis.  BMP shows Leukos of 180, BUN of 48 with creatinine 3.39.  Mildly increased patient's baseline around 3.0.  Calcium  8.7.  Hepatic function panel shows albumin  2.6 and alk phos at 133, otherwise no electrolyte or LFT abnormality.  BNP is elevated to 41.1 which appears to be higher than patient's previous.  Initial troponin at 72,  pending delta.  His previous troponins were in the 100s.  COVID, flu, RSV negative.  CXR shows  Mild cardiomegaly with mild pulmonary vascular congestion. Per radiologist's interpretation.    EKG reviewed and interpreted by my attending and read as normal sinus rhythm Minimal voltage criteria for LVH, may be normal variant ( R in aVL ) Anterior infarct , age undetermined Artifact.  Given patient's multiple morbidities, do feel this is more CHF  exacerbation however could be occult pneumonia or viral illness or CHF exacerbation.  Given his presentation, do feel admission is warranted for further workup.  Admit to Internal Medicine Teaching Service.  Discussed, will order VQ scan and antibiotics in case this is pneumonia versus PE versus CHF exacerbation.  I discussed this case with my attending physician who cosigned this note including patient's presenting symptoms, physical exam, and  planned diagnostics and interventions. Attending physician stated agreement with plan or made changes to plan which were implemented.   Portions of this report may have been transcribed using voice recognition software. Every effort was made to ensure accuracy; however, inadvertent computerized transcription errors may be present.    Final diagnoses:  Peripheral edema  Acute cough  Acute on chronic congestive heart failure, unspecified heart failure type [facility])    ED Discharge Orders     None          Bernis Ernst, PA-C 08/06/24 1023    Franklyn Sid SAILOR, MD 08/06/24 1123

## 2024-08-07 ENCOUNTER — Other Ambulatory Visit: Payer: Self-pay

## 2024-08-07 ENCOUNTER — Observation Stay (HOSPITAL_COMMUNITY)

## 2024-08-07 DIAGNOSIS — E8809 Other disorders of plasma-protein metabolism, not elsewhere classified: Secondary | ICD-10-CM | POA: Diagnosis not present

## 2024-08-07 DIAGNOSIS — R0602 Shortness of breath: Secondary | ICD-10-CM | POA: Diagnosis not present

## 2024-08-07 DIAGNOSIS — I132 Hypertensive heart and chronic kidney disease with heart failure and with stage 5 chronic kidney disease, or end stage renal disease: Secondary | ICD-10-CM | POA: Diagnosis not present

## 2024-08-07 DIAGNOSIS — E1122 Type 2 diabetes mellitus with diabetic chronic kidney disease: Secondary | ICD-10-CM | POA: Diagnosis present

## 2024-08-07 DIAGNOSIS — I110 Hypertensive heart disease with heart failure: Secondary | ICD-10-CM | POA: Diagnosis not present

## 2024-08-07 DIAGNOSIS — I10 Essential (primary) hypertension: Secondary | ICD-10-CM | POA: Diagnosis not present

## 2024-08-07 DIAGNOSIS — K219 Gastro-esophageal reflux disease without esophagitis: Secondary | ICD-10-CM | POA: Diagnosis not present

## 2024-08-07 DIAGNOSIS — I130 Hypertensive heart and chronic kidney disease with heart failure and stage 1 through stage 4 chronic kidney disease, or unspecified chronic kidney disease: Secondary | ICD-10-CM | POA: Diagnosis not present

## 2024-08-07 DIAGNOSIS — I509 Heart failure, unspecified: Secondary | ICD-10-CM | POA: Diagnosis not present

## 2024-08-07 DIAGNOSIS — Z79899 Other long term (current) drug therapy: Secondary | ICD-10-CM

## 2024-08-07 DIAGNOSIS — Z7901 Long term (current) use of anticoagulants: Secondary | ICD-10-CM | POA: Diagnosis not present

## 2024-08-07 DIAGNOSIS — Z23 Encounter for immunization: Secondary | ICD-10-CM | POA: Diagnosis not present

## 2024-08-07 DIAGNOSIS — R0989 Other specified symptoms and signs involving the circulatory and respiratory systems: Secondary | ICD-10-CM | POA: Diagnosis not present

## 2024-08-07 DIAGNOSIS — E1165 Type 2 diabetes mellitus with hyperglycemia: Secondary | ICD-10-CM | POA: Diagnosis present

## 2024-08-07 DIAGNOSIS — E785 Hyperlipidemia, unspecified: Secondary | ICD-10-CM | POA: Diagnosis present

## 2024-08-07 DIAGNOSIS — N179 Acute kidney failure, unspecified: Secondary | ICD-10-CM | POA: Diagnosis not present

## 2024-08-07 DIAGNOSIS — D631 Anemia in chronic kidney disease: Secondary | ICD-10-CM

## 2024-08-07 DIAGNOSIS — Z1152 Encounter for screening for COVID-19: Secondary | ICD-10-CM | POA: Diagnosis not present

## 2024-08-07 DIAGNOSIS — N185 Chronic kidney disease, stage 5: Secondary | ICD-10-CM | POA: Diagnosis not present

## 2024-08-07 DIAGNOSIS — G4733 Obstructive sleep apnea (adult) (pediatric): Secondary | ICD-10-CM | POA: Diagnosis present

## 2024-08-07 DIAGNOSIS — N2581 Secondary hyperparathyroidism of renal origin: Secondary | ICD-10-CM | POA: Diagnosis present

## 2024-08-07 DIAGNOSIS — J45909 Unspecified asthma, uncomplicated: Secondary | ICD-10-CM | POA: Diagnosis not present

## 2024-08-07 DIAGNOSIS — R051 Acute cough: Secondary | ICD-10-CM | POA: Diagnosis not present

## 2024-08-07 DIAGNOSIS — Z794 Long term (current) use of insulin: Secondary | ICD-10-CM | POA: Diagnosis not present

## 2024-08-07 DIAGNOSIS — I2510 Atherosclerotic heart disease of native coronary artery without angina pectoris: Secondary | ICD-10-CM | POA: Diagnosis present

## 2024-08-07 DIAGNOSIS — R600 Localized edema: Secondary | ICD-10-CM

## 2024-08-07 DIAGNOSIS — J9601 Acute respiratory failure with hypoxia: Secondary | ICD-10-CM | POA: Diagnosis not present

## 2024-08-07 DIAGNOSIS — I5033 Acute on chronic diastolic (congestive) heart failure: Secondary | ICD-10-CM | POA: Diagnosis not present

## 2024-08-07 DIAGNOSIS — I517 Cardiomegaly: Secondary | ICD-10-CM | POA: Diagnosis not present

## 2024-08-07 DIAGNOSIS — Z5902 Unsheltered homelessness: Secondary | ICD-10-CM | POA: Diagnosis not present

## 2024-08-07 DIAGNOSIS — E872 Acidosis, unspecified: Secondary | ICD-10-CM | POA: Diagnosis not present

## 2024-08-07 DIAGNOSIS — I272 Pulmonary hypertension, unspecified: Secondary | ICD-10-CM | POA: Diagnosis not present

## 2024-08-07 DIAGNOSIS — N184 Chronic kidney disease, stage 4 (severe): Secondary | ICD-10-CM | POA: Diagnosis not present

## 2024-08-07 DIAGNOSIS — Z6837 Body mass index (BMI) 37.0-37.9, adult: Secondary | ICD-10-CM

## 2024-08-07 DIAGNOSIS — E876 Hypokalemia: Secondary | ICD-10-CM | POA: Diagnosis present

## 2024-08-07 DIAGNOSIS — E669 Obesity, unspecified: Secondary | ICD-10-CM

## 2024-08-07 LAB — BASIC METABOLIC PANEL WITH GFR
Anion gap: 12 (ref 5–15)
Anion gap: 11 (ref 5–15)
BUN: 48 mg/dL — ABNORMAL HIGH (ref 6–20)
BUN: 44 mg/dL — ABNORMAL HIGH (ref 6–20)
CO2: 24 mmol/L (ref 22–32)
CO2: 25 mmol/L (ref 22–32)
Calcium: 8.3 mg/dL — ABNORMAL LOW (ref 8.9–10.3)
Calcium: 8.2 mg/dL — ABNORMAL LOW (ref 8.9–10.3)
Chloride: 106 mmol/L (ref 98–111)
Chloride: 103 mmol/L (ref 98–111)
Creatinine, Ser: 3.53 mg/dL — ABNORMAL HIGH (ref 0.61–1.24)
Creatinine, Ser: 3.45 mg/dL — ABNORMAL HIGH (ref 0.61–1.24)
GFR, Estimated: 20 mL/min — ABNORMAL LOW (ref >60)
GFR, Estimated: 21 mL/min — ABNORMAL LOW (ref >60)
Glucose, Bld: 126 mg/dL — ABNORMAL HIGH (ref 70–99)
Glucose, Bld: 137 mg/dL — ABNORMAL HIGH (ref 70–99)
Potassium: 3.7 mmol/L (ref 3.5–5.1)
Potassium: 3.7 mmol/L (ref 3.5–5.1)
Sodium: 142 mmol/L (ref 135–145)
Sodium: 139 mmol/L (ref 135–145)

## 2024-08-07 LAB — RENAL FUNCTION PANEL
Albumin: 2.4 g/dL — ABNORMAL LOW (ref 3.5–5.0)
Anion gap: 12 (ref 5–15)
BUN: 50 mg/dL — ABNORMAL HIGH (ref 6–20)
CO2: 24 mmol/L (ref 22–32)
Calcium: 8.3 mg/dL — ABNORMAL LOW (ref 8.9–10.3)
Chloride: 104 mmol/L (ref 98–111)
Creatinine, Ser: 3.95 mg/dL — ABNORMAL HIGH (ref 0.61–1.24)
GFR, Estimated: 18 mL/min — ABNORMAL LOW (ref >60)
Glucose, Bld: 193 mg/dL — ABNORMAL HIGH (ref 70–99)
Phosphorus: 3.8 mg/dL (ref 2.5–4.6)
Potassium: 4.1 mmol/L (ref 3.5–5.1)
Sodium: 140 mmol/L (ref 135–145)

## 2024-08-07 LAB — FERRITIN: Ferritin: 116 ng/mL (ref 24–336)

## 2024-08-07 LAB — CBC
HCT: 30.2 % — ABNORMAL LOW (ref 39.0–52.0)
Hemoglobin: 10.2 g/dL — ABNORMAL LOW (ref 13.0–17.0)
MCH: 31 pg (ref 26.0–34.0)
MCHC: 33.8 g/dL (ref 30.0–36.0)
MCV: 91.8 fL (ref 80.0–100.0)
Platelets: 261 K/uL (ref 150–400)
RBC: 3.29 MIL/uL — ABNORMAL LOW (ref 4.22–5.81)
RDW: 11.8 % (ref 11.5–15.5)
WBC: 8.5 K/uL (ref 4.0–10.5)
nRBC: 0 % (ref 0.0–0.2)

## 2024-08-07 LAB — GLUCOSE, CAPILLARY
Glucose-Capillary: 122 mg/dL — ABNORMAL HIGH (ref 70–99)
Glucose-Capillary: 132 mg/dL — ABNORMAL HIGH (ref 70–99)
Glucose-Capillary: 154 mg/dL — ABNORMAL HIGH (ref 70–99)
Glucose-Capillary: 178 mg/dL — ABNORMAL HIGH (ref 70–99)
Glucose-Capillary: 171 mg/dL — ABNORMAL HIGH (ref 70–99)

## 2024-08-07 LAB — PROTEIN / CREATININE RATIO, URINE
Creatinine, Urine: 91 mg/dL
Total Protein, Urine: >600 mg/dL

## 2024-08-07 LAB — EXPECTORATED SPUTUM ASSESSMENT W GRAM STAIN, RFLX TO RESP C

## 2024-08-07 LAB — IRON AND TIBC
Iron: 28 ug/dL — ABNORMAL LOW (ref 45–182)
Saturation Ratios: 12 % — ABNORMAL LOW (ref 17.9–39.5)
TIBC: 242 ug/dL — ABNORMAL LOW (ref 250–450)
UIBC: 214 ug/dL

## 2024-08-07 LAB — VITAMIN B12: Vitamin B-12: 370 pg/mL (ref 180–914)

## 2024-08-07 LAB — MAGNESIUM: Magnesium: 2.1 mg/dL (ref 1.7–2.4)

## 2024-08-07 LAB — MRSA NEXT GEN BY PCR, NASAL: MRSA by PCR Next Gen: NOT DETECTED

## 2024-08-07 MED ORDER — FUROSEMIDE 10 MG/ML IJ SOLN
120.0000 mg | Freq: Three times a day (TID) | INTRAMUSCULAR | Status: DC
  Administered 2024-08-07 – 2024-08-12 (×14): 120 mg via INTRAVENOUS
  Filled 2024-08-07 (×2): qty 10
  Filled 2024-08-07: qty 2
  Filled 2024-08-07: qty 12
  Filled 2024-08-07 (×3): qty 10
  Filled 2024-08-07: qty 12
  Filled 2024-08-07: qty 10
  Filled 2024-08-07: qty 2
  Filled 2024-08-07 (×2): qty 12
  Filled 2024-08-07 (×5): qty 120

## 2024-08-07 MED ORDER — IRON SUCROSE 500 MG IVPB - SIMPLE MED
500.0000 mg | Freq: Once | INTRAVENOUS | Status: DC
  Filled 2024-08-07: qty 275

## 2024-08-07 MED ORDER — SODIUM CHLORIDE 0.9 % IV SOLN
500.0000 mg | Freq: Once | INTRAVENOUS | Status: AC
  Administered 2024-08-07: 500 mg via INTRAVENOUS
  Filled 2024-08-07: qty 25

## 2024-08-07 MED ORDER — SODIUM CHLORIDE 0.9 % IV SOLN: 2.0000 g | Freq: Once | INTRAVENOUS | Status: DC

## 2024-08-07 MED ORDER — SODIUM CHLORIDE 0.9 % IV SOLN: 500.0000 mg | Freq: Once | INTRAVENOUS | Status: DC

## 2024-08-07 MED ORDER — FUROSEMIDE 10 MG/ML IJ SOLN
120.0000 mg | Freq: Once | INTRAVENOUS | Status: AC
  Administered 2024-08-07: 120 mg via INTRAVENOUS
  Filled 2024-08-07: qty 10

## 2024-08-07 NOTE — Progress Notes (Signed)
 BLE venous duplex has been completed.   Results can be found under chart review under CV PROC. 08/07/2024 11:59 AM Mycah Mcdougall RVT, RDMS

## 2024-08-07 NOTE — Plan of Care (Signed)
 Alert and oriented.  Unna boots placed.  IV iron continues to infuse.    Problem: Education: Goal: Ability to describe self-care measures that may prevent or decrease complications (Diabetes Survival Skills Education) will improve Outcome: Progressing Goal: Individualized Educational Video(s) Outcome: Progressing   Problem: Coping: Goal: Ability to adjust to condition or change in health will improve Outcome: Progressing   Problem: Fluid Volume: Goal: Ability to maintain a balanced intake and output will improve Outcome: Progressing   Problem: Health Behavior/Discharge Planning: Goal: Ability to identify and utilize available resources and services will improve Outcome: Progressing Goal: Ability to manage health-related needs will improve Outcome: Progressing   Problem: Metabolic: Goal: Ability to maintain appropriate glucose levels will improve Outcome: Progressing

## 2024-08-07 NOTE — Evaluation (Signed)
 Physical Therapy Brief Evaluation and Discharge Note Patient Details Name: Leroy Simmons MRN: [id number] DOB: May 10, 1971 Today's Date: 08/07/2024   History of Present Illness  Pt is a 53 y.o male admitted 11/4 for BLE edema. Acute on chronic diastolic HF, and acute hypoxic respiratory failure. PMH: CHF, anxiety, depression, T2DM, HTN,  laparoscopic appendectomy 12/23 due to gangrenous appendicitis, NSTEMI, AKI, DVT, CAD, PE  Clinical Impression  Pt mobilizing at or very close to baseline. The only assist I gave was pushing his IV pole. No further PT needed. Will refer to mobility team to make sure he continues to mobilize while here.        PT Assessment Patient does not need any further PT services  Assistance Needed at Discharge  None    Equipment Recommendations None recommended by PT  Recommendations for Other Services       Precautions/Restrictions Precautions Precautions: None Restrictions Weight Bearing Restrictions Per Provider Order: No        Mobility  Bed Mobility       General bed mobility comments: Sitting EOB  Transfers Overall transfer level: Modified independent Equipment used: Rollator (4 wheels), None Transfers: Sit to/from Stand                  Ambulation/Gait Ambulation/Gait assistance: Supervision Gait Distance (Feet): 200 Feet Assistive device: Rollator (4 wheels), None Gait Pattern/deviations: Step-through pattern Gait Speed: Pace WFL General Gait Details: Steady gait with or without rollator. Supervision only to push IV pole  Home Activity Instructions    Stairs            Modified Rankin (Stroke Patients Only)        Balance Overall balance assessment: Mild deficits observed, not formally tested                        Pertinent Vitals/Pain PT - Brief Vital Signs All Vital Signs Stable: Yes Pain Assessment Pain Assessment: No/denies pain     Home Living Family/patient expects to be discharged to::  Unsure             Additional Comments: Pt just evicted from home. Has a rollator but it is in storage.    Prior Function Level of Independence: Independent Comments: Has not been using rollator.    UE/LE Assessment   UE ROM/Strength/Tone/Coordination:  (defer to OT)    LE ROM/Strength/Tone/Coordination: [facility]      Communication   Communication Communication: No apparent difficulties     Cognition Overall Cognitive Status: Appears within functional limits for tasks assessed/performed       General Comments General comments (skin integrity, edema, etc.): Bil pitting edema noted in BLEs    Exercises     Assessment/Plan    PT Problem List         PT Visit Diagnosis Other abnormalities of gait and mobility (R26.89)    No Skilled PT Patient at baseline level of functioning;Patient is modified independent with all activity/mobility   Co-evaluation                AMPAC 6 Clicks Help needed turning from your back to your side while in a flat bed without using bedrails?: None Help needed moving from lying on your back to sitting on the side of a flat bed without using bedrails?: None Help needed moving to and from a bed to a chair (including a wheelchair)?: None Help needed standing up from a chair using your arms (e.g.,  wheelchair or bedside chair)?: None Help needed to walk in hospital room?: None Help needed climbing 3-5 steps with a railing? : None 6 Click Score: 24      End of Session   Activity Tolerance: Patient tolerated treatment well Patient left: in bed;with call bell/phone within reach (sitting EOB)   PT Visit Diagnosis: Other abnormalities of gait and mobility (R26.89)     Time: 8470-8459 PT Time Calculation (min) (ACUTE ONLY): 11 min  Charges:   PT Evaluation $PT Eval Low Complexity: 1 Low      [facility] PT Acute Rehabilitation Services Office [phone]   [facility]  08/07/2024, 4:51 PM

## 2024-08-07 NOTE — Plan of Care (Signed)
  Problem: Skin Integrity: Goal: Risk for impaired skin integrity will decrease Outcome: Progressing   Problem: Tissue Perfusion: Goal: Adequacy of tissue perfusion will improve Outcome: Progressing   Problem: Clinical Measurements: Goal: Respiratory complications will improve Outcome: Progressing Goal: Cardiovascular complication will be avoided Outcome: Progressing   Problem: Nutrition: Goal: Adequate nutrition will be maintained Outcome: Progressing   Problem: Elimination: Goal: Will not experience complications related to bowel motility Outcome: Progressing Goal: Will not experience complications related to urinary retention Outcome: Progressing   Problem: Safety: Goal: Ability to remain free from injury will improve Outcome: Progressing

## 2024-08-07 NOTE — Progress Notes (Signed)
 HD#0 SUBJECTIVE:  Patient Summary: Leroy Simmons is a 53 y.o. male with PMH of HFpEF, Left peroneal DVT in 2024, PE in 2025, uncontrolled DM, HTN, CKD4, presenting to the [facility] for worsening shortness of breath for the past week and admitted for acute on chronic HFpEF.  Overnight Events: none  Interim History: He slept well, has been peeing more with the medicine than he was at home, and is less SOB than when he arrived. He has been tolerating food well. Discussed plan to continue diuresing him today.   OBJECTIVE:  Vital Signs: Vitals:   08/07/24 0006 08/07/24 0550 08/07/24 0609 08/07/24 0800  BP: (!) 152/107 (!) 160/97 (!) 160/97 (!) 148/85  Pulse: 85 82  85  Resp: 19 18  (!) 21  Temp: 98.5 F (36.9 C) 98.3 F (36.8 C)  98 F (36.7 C)  TempSrc: Oral Oral  Oral  SpO2: 99% 96%  98%  Weight:  110.7 kg    Height:       Supplemental O2: Room Air SpO2: 98 % O2 Flow Rate (L/min): 2 L/min  Filed Weights   08/06/24 0149 08/06/24 1500 08/07/24 0550  Weight: 111.1 kg 110.5 kg 110.7 kg     Intake/Output Summary (Last 24 hours) at 08/07/2024 1142 Last data filed at 08/07/2024 1100 Gross per 24 hour  Intake 541.18 ml  Output 2150 ml  Net -1608.82 ml   Net IO Since Admission: -2,058.45 mL [08/07/24 1142]  Physical Exam: Physical Exam Vitals reviewed.  Constitutional:      General: He is not in acute distress. Cardiovascular:     Rate and Rhythm: Normal rate and regular rhythm.     Pulses: Normal pulses.     Heart sounds: Normal heart sounds.     Comments: Swelling up to abdomen, anasarca  Pulmonary:     Effort: Pulmonary effort is normal.     Breath sounds: Normal breath sounds. No decreased breath sounds.  Musculoskeletal:     Right lower leg: Edema present.     Left lower leg: Edema present.     Comments: 2+ pitting edema BL LE  Skin:    General: Skin is warm.  Neurological:     Mental Status: He is alert and oriented to person, place, and time.  Psychiatric:         Mood and Affect: Mood normal.        Behavior: Behavior normal.     Patient Lines/Drains/Airways Status     Active Line/Drains/Airways     Name Placement date Placement time Site Days   Peripheral IV 08/06/24 20 G Left Antecubital 08/06/24  0736  Antecubital  1            Pertinent labs and imaging:      Latest Ref Rng & Units 08/07/2024    3:22 AM 08/06/2024    1:46 AM 04/25/2024    4:07 PM  CBC  WBC 4.0 - 10.5 K/uL 8.5  8.9  6.6   Hemoglobin 13.0 - 17.0 g/dL 89.7  88.4  86.7   Hematocrit 39.0 - 52.0 % 30.2  34.9  41.3   Platelets 150 - 400 K/uL 261  316  259        Latest Ref Rng & Units 08/07/2024    3:22 AM 08/06/2024    3:46 PM 08/06/2024    7:39 AM  CMP  Glucose 70 - 99 mg/dL 873  830    BUN 6 - 20 mg/dL 48  46    Creatinine 0.61 - 1.24 mg/dL 6.46  6.52    Sodium 864 - 145 mmol/L 142  141    Potassium 3.5 - 5.1 mmol/L 3.7  3.7    Chloride 98 - 111 mmol/L 106  105    CO2 22 - 32 mmol/L 24  24    Calcium  8.9 - 10.3 mg/dL 8.3  8.3    Total Protein 6.5 - 8.1 g/dL   6.5   Total Bilirubin 0.0 - 1.2 mg/dL   0.8   Alkaline Phos 38 - 126 U/L   133   AST 15 - 41 U/L   22   ALT 0 - 44 U/L   26     NM Pulmonary Perfusion Result Date: 08/06/2024 EXAM: NM Lung Perfusion Scan. CLINICAL HISTORY: Pulmonary embolism (PE) suspected, high prob. TECHNIQUE: Radiolabeled MAA was administered intravenously and planar images of the lungs were obtained in multiple projections. RADIOPHARMACEUTICAL: 4.4 millicurie technetium-65m albumin  aggregated (MAA) injection solution. COMPARISON: X-ray at 11:45. FINDINGS: PERFUSION: No wedge-shaped peripheral perfusion defect within left or right lung to suggest acute pulmonary embolism. Normal perfusion pattern. IMPRESSION: 1. No perfusion defects to indicate pulmonary embolism. Electronically signed by: Norleen Boxer MD 08/06/2024 02:35 PM EST RP Workstation: [id number]    ASSESSMENT/PLAN:  Assessment: Principal Problem:   Acute on chronic  diastolic heart failure with preserved ejection fraction (HCC) Active Problems:   Uncontrolled type 2 diabetes mellitus with hyperglycemia, with long-term current use of insulin  (HCC)   Anemia of chronic disease   History of DVT (deep vein thrombosis), LLE 2024   CKD (chronic kidney disease) stage 4, GFR 15-29 ml/min (HCC)   History of pulmonary embolism, LLL 01/2024   Peripheral edema   Plan:  Acute on chronic diastolic HF S/p 60 mg and 80 mg Lasix  IV w/ UOP 2250 mL. This admission, TTE LVEF 60-65% and LV w/o RMWA. Ferritin 116, Sat ratios 12. 106 Kg on d/c in 01/2024, today 110kg, will give another 120 mg IV. He is still hypervolemic on exam but feels like his breathing is easier. If still volume overloaded, consider adding metolazone   - Gave 500 mg IV Iron 11/4 - recheck BMP 1130 and in AM - carvedilol  12.5 mg BID, hydralazine  10 mg TID - Strict in/outs - Daily standing weights  CKD4 Nephrotic Range Proteinuria After 60 and 80 mg IV Lasix , BUN 48, Cr 3.53, GFR 20. 6 months ago protein creatine ratio was 9.96, repeat on admission result outside reportable range, albumin  2.6, and hyperlipidemia (LDL 136, total cholesterol 210 1 mo ago). Consulted nephro for likely nephrotic syndrome, appreciate time and recommendations   - renal diet with salt and fluid restriction - consider adding ACEi/ARB and or SGLT2i for nephro protection  - Strict in/out - Weight daily   Hypertension 160/97 this AM. Will see how he does after diuresis before changing anything, likely increase carvedilol  to 25 mg BID - Hydralazine  10 mg TID  CAD Hyperlipidemia Left heart cath 03/2022 that revealed 50% D2, 40% mid LAD, and 25% ramus lesions.  - atorvastatin  80 mg daily - Carvedilol  12.5 mg bid   Diabetes mellitus on chronic insulin  use A1c this admission 7.2. FBG this AM 122.  - 25 units semglee  and 6 units TID with meals  Acute Hypoxic respiratory failure, resolving  Suspect in setting of acute HFpEF.  V/Q not c/w PE. Afebrile, normal WBC, low suspicious for bacterial infection will d/c abx. SOB has improved with diuresis, breathing comfortably on room  air.  - Eliquis  5 mg BID - ambulatory pulse ox, incentive spirometer, PT/OT   Hypercoagulable, VTE Hx BL Vas US  c/w subQ edema and w/o evidence of DVT - Eliquis  5 mg BID  Normocytic anemia with iron deficiency Hgb 11.5 on admission, 10.2 today. Ferritin 116, Iron 28, Sat ratios 12. B12 370. Could be from redistribution of fluid as all cell lines dropped, likely closer to baseline after diuresis. Gave IV Iron today, will monitor  -CBC AM    OSA suspected Patient awaiting sleep studies. Did not wear CPAP last night.  - CPAP at night   Asthma - PRN albuterol    GERD - pantoprazole  40 mg daily   Elevated Alkaline phosphatase No hepatic enzyme elevation, hep C and Hep B in 04/2024 nonreactive    Obesity, BMI 37 - Patient is about to start of GLP1 RA  Best Practice: Diet:  Renal diet with fluid restriction  IVF: Fluids: none, Rate: None VTE: SCDs Start: 08/06/24 1014, apixaban   Code: Full  Disposition planning: Therapy Recs: Pending, DME: none DISPO: Anticipated discharge in 2-3 days to Home pending medical management and clinical improvement.  Signature:  Viktoria Charmayne [facility]  11:42 AM, 08/07/2024  On Call pager [phone]

## 2024-08-07 NOTE — Progress Notes (Signed)
 Sent message below to Pharmacy:  Patient with only 1 peripheral IV. Currently infusing iron for another hour and 30 mins. Please retime dose for 1900 as the lasix  and iron are not compatible. Thanks.   Leroy Simmons [facility] replied and retimed furosemide  120 mg dose for 2000 due to Iron infusion and only one IV access.

## 2024-08-07 NOTE — Progress Notes (Signed)
 Transported off unit to Vascular Lab at this time.  Transport method: BED  Escorted by: PATIENT TRANSPORTER

## 2024-08-07 NOTE — Progress Notes (Signed)
 Arrived back to unit after Vascular Lab.  Transport method: Bed Accompanied by: Patient Transporter.     08/07/24 1332  Vitals  Temp 98.5 F (36.9 C)  Temp Source Oral  BP 136/78  MAP (mmHg) 95  BP Location Right Arm  BP Method Automatic  Patient Position (if appropriate) Sitting  Pulse Rate 83  Pulse Rate Source Monitor  ECG Heart Rate 85  Resp 17  Level of Consciousness  Level of Consciousness Alert  MEWS COLOR  MEWS Score Color Green  Oxygen Therapy  SpO2 95 %  O2 Device Room Air  Pain Assessment  Pain Scale 0-10  Pain Score 0  MEWS Score  MEWS Temp 0  MEWS Systolic 0  MEWS Pulse 0  MEWS RR 0  MEWS LOC 0  MEWS Score 0

## 2024-08-07 NOTE — Consult Note (Signed)
 Reason for Consult: Renal failure Referring Physician: Dr. Viktoria King  Chief Complaint: Shortness of breath  Assessment/Plan: CKDIV with a baseline creatinine in the 3-3.45 range presumably from diabetic nephropathy.  He already had a UPC of 5.2 and April 2024, 10 g in April 2025 - Increase in proteinuria is almost certainly secondary to diabetic nephropathy; we can send a limited serologic w/u. He also already has very advanced baseline chronic kidney disease. - Maximize medical therapies such as ACEi/ARB (as tolerated), SGLT2i (on Jardiance ), GLP1 agonist; we can challenge with finerenone as outpt. May be high risk for developing hyperkalemia. JENITA boots  - Lasix  120mg  q8hrs and will monitor response  -Monitor Daily I/Os, Daily weight  -Maintain MAP>65 for optimal renal perfusion.  - Avoid nephrotoxic agents such as IV contrast, NSAIDs, and phosphate containing bowel preps (FLEETS)  HFpEF LVEF 60% - responding nicely to diuresis. - Already net neg 2.1L Hypertension Renal osteodystrophy -check a 25 vitamin D, PTH  Anemia -TSAT 12% F116 -> already given Venofer 500mg . DM - On Semglee  and Novolog  H/o PE on Eliquis    HPI: [name] is an 53 y.o. male with a history of HFpEF, PE on Eliquis , CASHD, asthma, diabetes, CKD followed by Dr. Saralee Stank presenting with nonproductive cough x 6 days as well as chest pain, shortness of breath, objective fevers, rhinorrhea and congestion.  He had chest pain only when he was coughing but denies any nausea, vomiting, diarrhea, dysuria, hematuria, obstructive like symptoms.  Patient also noted lower extremity edema that steadily worsened but he has been noncompliant with his medications admittedly.  Patient was last seen by Dr. Saralee Stank for CKD in January 2024.  ROS Pertinent items are noted in HPI.  Chemistry and CBC: Creatinine, Ser  Date/Time Value Ref Range Status  08/07/2024 11:15 AM 3.45 (H) 0.61 - 1.24 mg/dL Final  88/94/7974  96:77 AM 3.53 (H) 0.61 - 1.24 mg/dL Final  88/95/7974 96:53 PM 3.47 (H) 0.61 - 1.24 mg/dL Final  88/95/7974 98:53 AM 3.39 (H) 0.61 - 1.24 mg/dL Final  89/93/7974 97:72 PM 2.74 (H) 0.76 - 1.27 mg/dL Final  92/75/7974 95:92 PM 3.02 (H) 0.76 - 1.27 mg/dL Final  95/81/7974 96:52 AM 3.10 (H) 0.61 - 1.24 mg/dL Final  95/82/7974 96:48 AM 3.05 (H) 0.61 - 1.24 mg/dL Final  95/83/7974 96:48 AM 2.96 (H) 0.61 - 1.24 mg/dL Final  95/84/7974 95:85 AM 3.05 (H) 0.61 - 1.24 mg/dL Final  95/85/7974 95:79 AM 3.45 (H) 0.61 - 1.24 mg/dL Final  95/86/7974 96:45 AM 4.04 (H) 0.61 - 1.24 mg/dL Final  95/87/7974 95:50 AM 4.43 (H) 0.61 - 1.24 mg/dL Final  95/88/7974 95:87 AM 4.15 (H) 0.61 - 1.24 mg/dL Final  95/89/7974 95:70 AM 3.79 (H) 0.61 - 1.24 mg/dL Final  95/90/7974 94:91 AM 3.03 (H) 0.61 - 1.24 mg/dL Final  95/91/7974 94:65 AM 2.62 (H) 0.61 - 1.24 mg/dL Final  87/84/7975 90:84 AM 2.28 (H) 0.61 - 1.24 mg/dL Final  87/84/7975 93:44 AM 2.46 (H) 0.61 - 1.24 mg/dL Final  87/85/7975 94:74 AM 2.76 (H) 0.61 - 1.24 mg/dL Final  87/86/7975 93:61 AM 2.45 (H) 0.61 - 1.24 mg/dL Final  87/87/7975 94:93 AM 2.10 (H) 0.61 - 1.24 mg/dL Final  87/88/7975 97:75 PM 2.21 (H) 0.61 - 1.24 mg/dL Final  87/92/7975 96:53 PM 1.97 (H) 0.61 - 1.24 mg/dL Final  91/70/7975 93:85 PM 1.92 (H) 0.61 - 1.24 mg/dL Final  93/80/7975 87:74 AM 2.23 (H) 0.61 - 1.24 mg/dL Final  93/81/7975 90:88 AM 2.40 (  H) 0.61 - 1.24 mg/dL Final  93/82/7975 92:83 AM 2.64 (H) 0.61 - 1.24 mg/dL Final  93/83/7975 95:47 AM 3.13 (H) 0.61 - 1.24 mg/dL Final  93/84/7975 96:50 AM 4.12 (H) 0.61 - 1.24 mg/dL Final  93/85/7975 95:84 AM 5.28 (H) 0.61 - 1.24 mg/dL Final  93/86/7975 95:76 AM 3.91 (H) 0.61 - 1.24 mg/dL Final  93/87/7975 96:97 AM 2.67 (H) 0.61 - 1.24 mg/dL Final  93/88/7975 98:91 AM 1.74 (H) 0.61 - 1.24 mg/dL Final  93/89/7975 95:61 PM 1.60 (H) 0.61 - 1.24 mg/dL Final  94/91/7975 97:53 PM 2.07 (H) 0.61 - 1.24 mg/dL Final  95/76/7975 87:52 AM 1.90 (H) 0.61 -  1.24 mg/dL Final  95/77/7975 87:44 AM 1.78 (H) 0.61 - 1.24 mg/dL Final  95/78/7975 98:93 AM 1.79 (H) 0.61 - 1.24 mg/dL Final  95/79/7975 97:74 AM 1.95 (H) 0.61 - 1.24 mg/dL Final  95/80/7975 87:42 AM 2.12 (H) 0.61 - 1.24 mg/dL Final  95/81/7975 93:82 AM 1.97 (H) 0.61 - 1.24 mg/dL Final  95/81/7975 97:85 AM 1.86 (H) 0.61 - 1.24 mg/dL Final  95/82/7975 93:58 AM 1.71 (H) 0.61 - 1.24 mg/dL Final  98/95/7975 87:92 PM 1.33 (H) 0.61 - 1.24 mg/dL Final  87/70/7976 94:72 AM 2.15 (H) 0.61 - 1.24 mg/dL Final  87/71/7976 94:46 AM 2.92 (H) 0.61 - 1.24 mg/dL Final  87/72/7976 95:63 AM 3.02 (H) 0.61 - 1.24 mg/dL Final  87/73/7976 87:78 PM 2.51 (H) 0.61 - 1.24 mg/dL Final  87/73/7976 95:92 AM 2.06 (H) 0.61 - 1.24 mg/dL Final  87/74/7976 87:45 AM 1.35 (H) 0.61 - 1.24 mg/dL Final  87/75/7976 97:60 PM 1.37 (H) 0.61 - 1.24 mg/dL Final   Recent Labs  Lab 08/06/24 0146 08/06/24 1546 08/07/24 0322 08/07/24 1115  NA 138 141 142 139  K 4.0 3.7 3.7 3.7  CL 104 105 106 103  CO2 23 24 24 25   GLUCOSE 180* 169* 126* 137*  BUN 48* 46* 48* 44*  CREATININE 3.39* 3.47* 3.53* 3.45*  CALCIUM  8.7* 8.3* 8.3* 8.2*  PHOS  --  3.6  --   --    Recent Labs  Lab 08/06/24 0146 08/07/24 0322  WBC 8.9 8.5  HGB 11.5* 10.2*  HCT 34.9* 30.2*  MCV 93.6 91.8  PLT 316 261   Liver Function Tests: Recent Labs  Lab 08/06/24 0739 08/06/24 1546  AST 22  --   ALT 26  --   ALKPHOS 133*  --   BILITOT 0.8  --   PROT 6.5  --   ALBUMIN  2.6* 2.6*   No results for input(s): LIPASE, AMYLASE in the last 168 hours. No results for input(s): AMMONIA in the last 168 hours. Cardiac Enzymes: No results for input(s): CKTOTAL, CKMB, CKMBINDEX, TROPONINI in the last 168 hours. Iron Studies:  Recent Labs    08/07/24 0322  IRON 28*  TIBC 242*  FERRITIN 116   PT/INR: @LABRCNTIP (inr:5)  Xrays/Other Studies: ) Results for orders placed or performed during the hospital encounter of 08/06/24 (from the past 48  hours)  Basic metabolic panel     Status: Abnormal   Collection Time: 08/06/24  1:46 AM  Result Value Ref Range   Sodium 138 135 - 145 mmol/L   Potassium 4.0 3.5 - 5.1 mmol/L   Chloride 104 98 - 111 mmol/L   CO2 23 22 - 32 mmol/L   Glucose, Bld 180 (H) 70 - 99 mg/dL    Comment: Glucose reference range applies only to samples taken after fasting for at least 8 hours.  BUN 48 (H) 6 - 20 mg/dL   Creatinine, Ser 6.60 (H) 0.61 - 1.24 mg/dL   Calcium  8.7 (L) 8.9 - 10.3 mg/dL   GFR, Estimated 21 (L) >60 mL/min    Comment: (NOTE) Calculated using the CKD-EPI Creatinine Equation (2021)    Anion gap 11 5 - 15    Comment: Performed at [facility], [address], [place], [place] [address]  CBC     Status: Abnormal   Collection Time: 08/06/24  1:46 AM  Result Value Ref Range   WBC 8.9 4.0 - 10.5 K/uL   RBC 3.73 (L) 4.22 - 5.81 MIL/uL   Hemoglobin 11.5 (L) 13.0 - 17.0 g/dL   HCT 65.0 (L) 60.9 - 47.9 %   MCV 93.6 80.0 - 100.0 fL   MCH 30.8 26.0 - 34.0 pg   MCHC 33.0 30.0 - 36.0 g/dL   RDW 88.3 88.4 - 84.4 %   Platelets 316 150 - 400 K/uL   nRBC 0.0 0.0 - 0.2 %    Comment: Performed at [facility], [address], [place], [place] [address]  BNP (Order if Patient has history of Heart Failure)     Status: Abnormal   Collection Time: 08/06/24  1:46 AM  Result Value Ref Range   B Natriuretic Peptide 241.1 (H) 0.0 - 100.0 pg/mL    Comment: Performed at [facility], [address], [place], [place] [address]  Resp panel by RT-PCR (RSV, Flu A&B, Covid) Anterior Nasal Swab     Status: None   Collection Time: 08/06/24  7:02 AM   Specimen: Anterior Nasal Swab  Result Value Ref Range   SARS Coronavirus 2 by RT PCR NEGATIVE NEGATIVE   Influenza A by PCR NEGATIVE NEGATIVE   Influenza B by PCR NEGATIVE NEGATIVE    Comment: (NOTE) The Xpert Xpress SARS-CoV-2/FLU/RSV plus assay is intended as an aid in the diagnosis of influenza from Nasopharyngeal swab specimens and should  not be used as a sole basis for treatment. Nasal washings and aspirates are unacceptable for Xpert Xpress SARS-CoV-2/FLU/RSV testing.  Fact Sheet for Patients: bloggercourse.com  Fact Sheet for Healthcare Providers: seriousbroker.it  This test is not yet approved or cleared by the [place]  FDA and has been authorized for detection and/or diagnosis of SARS-CoV-2 by FDA under an Emergency Use Authorization (EUA). This EUA will remain in effect (meaning this test can be used) for the duration of the COVID-19 declaration under Section 564(b)(1) of the Act, 21 U.S.C. section 360bbb-3(b)(1), unless the authorization is terminated or revoked.     Resp Syncytial Virus by PCR NEGATIVE NEGATIVE    Comment: (NOTE) Fact Sheet for Patients: bloggercourse.com  Fact Sheet for Healthcare Providers: seriousbroker.it  This test is not yet approved or cleared by the [place]  FDA and has been authorized for detection and/or diagnosis of SARS-CoV-2 by FDA under an Emergency Use Authorization (EUA). This EUA will remain in effect (meaning this test can be used) for the duration of the COVID-19 declaration under Section 564(b)(1) of the Act, 21 U.S.C. section 360bbb-3(b)(1), unless the authorization is terminated or revoked.  Performed at [facility], [address] [place], [place] [address]   Troponin I (High Sensitivity)     Status: Abnormal   Collection Time: 08/06/24  7:39 AM  Result Value Ref Range   Troponin I (High Sensitivity) 72 (H) <18 ng/L    Comment: (NOTE) Elevated high sensitivity troponin I (hsTnI) values and  significant  changes across serial measurements may suggest ACS but many other  chronic and acute conditions are known to elevate hsTnI results.  Refer to the Links section for chest pain algorithms and additional  guidance. Performed at [facility], [address], [place], [place] [address]   Hepatic function panel     Status: Abnormal   Collection Time: 08/06/24  7:39 AM  Result Value Ref Range   Total Protein 6.5 6.5 - 8.1 g/dL   Albumin  2.6 (L) 3.5 - 5.0 g/dL   AST 22 15 - 41 U/L   ALT 26 0 - 44 U/L   Alkaline Phosphatase 133 (H) 38 - 126 U/L   Total Bilirubin 0.8 0.0 - 1.2 mg/dL   Bilirubin, Direct 0.1 0.0 - 0.2 mg/dL   Indirect Bilirubin 0.7 0.3 - 0.9 mg/dL    Comment: Performed at [facility], [address] [place], [place] [address]  Rapid urine drug screen (hospital performed)     Status: None   Collection Time: 08/06/24 10:12 AM  Result Value Ref Range   Opiates NONE DETECTED NONE DETECTED   Cocaine NONE DETECTED NONE DETECTED   Benzodiazepines NONE DETECTED NONE DETECTED   Amphetamines NONE DETECTED NONE DETECTED   Tetrahydrocannabinol NONE DETECTED NONE DETECTED   Barbiturates NONE DETECTED NONE DETECTED    Comment: (NOTE) DRUG SCREEN FOR MEDICAL PURPOSES ONLY.  IF CONFIRMATION IS NEEDED FOR ANY PURPOSE, NOTIFY LAB WITHIN 5 DAYS.  LOWEST DETECTABLE LIMITS FOR URINE DRUG SCREEN Drug Class                     Cutoff (ng/mL) Amphetamine and metabolites    1000 Barbiturate and metabolites    200 Benzodiazepine                 200 Opiates and metabolites        300 Cocaine and metabolites        300 THC                            50 Performed at [facility], [address] [place], [place] [address]   Urinalysis, Routine w reflex microscopic -Urine, Clean Catch     Status: Abnormal   Collection Time: 08/06/24 10:13 AM  Result Value Ref Range   Color, Urine YELLOW YELLOW   APPearance CLEAR CLEAR   Specific Gravity, Urine 1.011 1.005 - 1.030   pH 6.0 5.0 - 8.0   Glucose, UA >=500 (A) NEGATIVE mg/dL   Hgb urine dipstick NEGATIVE NEGATIVE   Bilirubin Urine NEGATIVE NEGATIVE   Ketones, ur NEGATIVE NEGATIVE mg/dL   Protein, ur >=699 (A) NEGATIVE mg/dL   Nitrite NEGATIVE NEGATIVE    Leukocytes,Ua NEGATIVE NEGATIVE   RBC / HPF 0-5 0 - 5 RBC/hpf   WBC, UA 0-5 0 - 5 WBC/hpf   Bacteria, UA NONE SEEN NONE SEEN   Squamous Epithelial / HPF 0-5 0 - 5 /HPF   Mucus PRESENT    Hyaline Casts, UA PRESENT     Comment: Performed at [facility], [address], [place], [place] [address]  Protein / creatinine ratio, urine     Status: None   Collection Time: 08/06/24 10:13 AM  Result Value Ref Range   Creatinine, Urine 91 mg/dL   Total Protein, Urine >600 mg/dL    Comment: RESULT CONFIRMED BY MANUAL DILUTION NO NORMAL RANGE  ESTABLISHED FOR THIS TEST    Protein Creatinine Ratio        0.00 - 0.15 mg/mg[Cre]    Comment: RESULT OUTSIDE REPORTABLE RANGE, UNABLE TO CALCULATE. Performed at [facility], [address], [place], [place] [address]   Magnesium      Status: None   Collection Time: 08/06/24 10:16 AM  Result Value Ref Range   Magnesium  2.0 1.7 - 2.4 mg/dL    Comment: Performed at [facility], [address], [place], [place] [address]  Troponin I (High Sensitivity)     Status: Abnormal   Collection Time: 08/06/24 10:17 AM  Result Value Ref Range   Troponin I (High Sensitivity) 56 (H) <18 ng/L    Comment: (NOTE) Elevated high sensitivity troponin I (hsTnI) values and significant  changes across serial measurements may suggest ACS but many other  chronic and acute conditions are known to elevate hsTnI results.  Refer to the Links section for chest pain algorithms and additional  guidance. Performed at [facility], [address] [place], [place] [address]   I-Stat CG4 Lactic Acid     Status: None   Collection Time: 08/06/24 10:25 AM  Result Value Ref Range   Lactic Acid, Venous 0.5 0.5 - 1.9 mmol/L  CBG monitoring, ED     Status: Abnormal   Collection Time: 08/06/24  1:31 PM  Result Value Ref Range   Glucose-Capillary 168 (H) 70 - 99 mg/dL    Comment: Glucose reference range applies only to samples taken after fasting for at least 8  hours.  Renal function panel     Status: Abnormal   Collection Time: 08/06/24  3:46 PM  Result Value Ref Range   Sodium 141 135 - 145 mmol/L   Potassium 3.7 3.5 - 5.1 mmol/L   Chloride 105 98 - 111 mmol/L   CO2 24 22 - 32 mmol/L   Glucose, Bld 169 (H) 70 - 99 mg/dL    Comment: Glucose reference range applies only to samples taken after fasting for at least 8 hours.   BUN 46 (H) 6 - 20 mg/dL   Creatinine, Ser 6.52 (H) 0.61 - 1.24 mg/dL   Calcium  8.3 (L) 8.9 - 10.3 mg/dL   Phosphorus 3.6 2.5 - 4.6 mg/dL   Albumin  2.6 (L) 3.5 - 5.0 g/dL   GFR, Estimated 21 (L) >60 mL/min    Comment: (NOTE) Calculated using the CKD-EPI Creatinine Equation (2021)    Anion gap 12 5 - 15    Comment: Performed at [facility], [address] [place], [place] [address]  Hemoglobin A1c     Status: Abnormal   Collection Time: 08/06/24  3:46 PM  Result Value Ref Range   Hgb A1c MFr Bld 7.2 (H) 4.8 - 5.6 %    Comment: (NOTE) Diagnosis of Diabetes The following HbA1c ranges recommended by the American Diabetes Association (ADA) may be used as an aid in the diagnosis of diabetes mellitus.  Hemoglobin             Suggested A1C NGSP%              Diagnosis  <5.7                   Non Diabetic  5.7-6.4                Pre-Diabetic  >6.4  Diabetic  <7.0                   Glycemic control for                       adults with diabetes.     Mean Plasma Glucose 159.94 mg/dL    Comment: Performed at [facility], [address] [place], [place] [address]  Glucose, capillary     Status: Abnormal   Collection Time: 08/06/24  4:10 PM  Result Value Ref Range   Glucose-Capillary 169 (H) 70 - 99 mg/dL    Comment: Glucose reference range applies only to samples taken after fasting for at least 8 hours.  Glucose, capillary     Status: Abnormal   Collection Time: 08/06/24  9:48 PM  Result Value Ref Range   Glucose-Capillary 214 (H) 70 - 99 mg/dL    Comment: Glucose reference range  applies only to samples taken after fasting for at least 8 hours.  Expectorated Sputum Assessment w Gram Stain, Rflx to Resp Cult     Status: None   Collection Time: 08/07/24 12:15 AM   Specimen: Expectorated Sputum  Result Value Ref Range   Specimen Description EXPECTORATED SPUTUM    Special Requests NONE    Sputum evaluation      THIS SPECIMEN IS ACCEPTABLE FOR SPUTUM CULTURE Performed at [facility], [address], [place], [place] [address]    Report Status 08/07/2024 FINAL   Culture, Respiratory w Gram Stain     Status: None (Preliminary result)   Collection Time: 08/07/24 12:15 AM  Result Value Ref Range   Specimen Description EXPECTORATED SPUTUM    Special Requests NONE Reflexed from [id number]    Gram Stain      FEW WBC PRESENT, PREDOMINANTLY PMN FEW GRAM NEGATIVE RODS RARE GRAM POSITIVE COCCI Performed at [facility], [address] [place], [place] [address]    Culture PENDING    Report Status PENDING   MRSA Next Gen by PCR, Nasal     Status: None   Collection Time: 08/07/24 12:16 AM   Specimen: Nasal Mucosa; Nasal Swab  Result Value Ref Range   MRSA by PCR Next Gen NOT DETECTED NOT DETECTED    Comment: (NOTE) The GeneXpert MRSA Assay (FDA approved for NASAL specimens only), is one component of a comprehensive MRSA colonization surveillance program. It is not intended to diagnose MRSA infection nor to guide or monitor treatment for MRSA infections. Test performance is not FDA approved in patients less than 81 years old. Performed at [facility], [address] [place], [place] [address]   Basic metabolic panel     Status: Abnormal   Collection Time: 08/07/24  3:22 AM  Result Value Ref Range   Sodium 142 135 - 145 mmol/L   Potassium 3.7 3.5 - 5.1 mmol/L   Chloride 106 98 - 111 mmol/L   CO2 24 22 - 32 mmol/L   Glucose, Bld 126 (H) 70 - 99 mg/dL    Comment: Glucose reference range applies only to samples taken after fasting for at least 8  hours.   BUN 48 (H) 6 - 20 mg/dL   Creatinine, Ser 6.46 (H) 0.61 - 1.24 mg/dL   Calcium  8.3 (L) 8.9 - 10.3 mg/dL   GFR, Estimated 20 (L) >60 mL/min    Comment: (NOTE) Calculated using the CKD-EPI Creatinine Equation (2021)    Anion gap 12  5 - 15    Comment: Performed at [facility], [address] [place], [place] [address]  Magnesium      Status: None   Collection Time: 08/07/24  3:22 AM  Result Value Ref Range   Magnesium  2.1 1.7 - 2.4 mg/dL    Comment: Performed at [facility], [address] [place], [place] [address]  CBC     Status: Abnormal   Collection Time: 08/07/24  3:22 AM  Result Value Ref Range   WBC 8.5 4.0 - 10.5 K/uL   RBC 3.29 (L) 4.22 - 5.81 MIL/uL   Hemoglobin 10.2 (L) 13.0 - 17.0 g/dL   HCT 69.7 (L) 60.9 - 47.9 %   MCV 91.8 80.0 - 100.0 fL   MCH 31.0 26.0 - 34.0 pg   MCHC 33.8 30.0 - 36.0 g/dL   RDW 88.1 88.4 - 84.4 %   Platelets 261 150 - 400 K/uL   nRBC 0.0 0.0 - 0.2 %    Comment: Performed at [facility], [address] [place], [place] [address]  Iron and TIBC     Status: Abnormal   Collection Time: 08/07/24  3:22 AM  Result Value Ref Range   Iron 28 (L) 45 - 182 ug/dL   TIBC 757 (L) 749 - 549 ug/dL   Saturation Ratios 12 (L) 17.9 - 39.5 %   UIBC 214 ug/dL    Comment: Performed at [facility], [address] [place], [place] [address]  Ferritin     Status: None   Collection Time: 08/07/24  3:22 AM  Result Value Ref Range   Ferritin 116 24 - 336 ng/mL    Comment: Performed at [facility], [address] [place], [place] [address]  Vitamin B12     Status: None   Collection Time: 08/07/24  3:22 AM  Result Value Ref Range   Vitamin B-12 370 180 - 914 pg/mL    Comment: (NOTE) This assay is not validated for testing neonatal or myeloproliferative syndrome specimens for Vitamin B12 levels. Performed at [facility], [address] [place], [place] [address]   Glucose, capillary     Status: Abnormal    Collection Time: 08/07/24  6:53 AM  Result Value Ref Range   Glucose-Capillary 122 (H) 70 - 99 mg/dL    Comment: Glucose reference range applies only to samples taken after fasting for at least 8 hours.  Basic metabolic panel     Status: Abnormal   Collection Time: 08/07/24 11:15 AM  Result Value Ref Range   Sodium 139 135 - 145 mmol/L   Potassium 3.7 3.5 - 5.1 mmol/L   Chloride 103 98 - 111 mmol/L   CO2 25 22 - 32 mmol/L   Glucose, Bld 137 (H) 70 - 99 mg/dL    Comment: Glucose reference range applies only to samples taken after fasting for at least 8 hours.   BUN 44 (H) 6 - 20 mg/dL   Creatinine, Ser 6.54 (H) 0.61 - 1.24 mg/dL   Calcium  8.2 (L) 8.9 - 10.3 mg/dL   GFR, Estimated 21 (L) >60 mL/min    Comment: (NOTE) Calculated using the CKD-EPI Creatinine Equation (2021)    Anion gap 11 5 - 15    Comment: Performed at [facility], [address] [place], [place] [address]  Glucose, capillary     Status: Abnormal   Collection Time: 08/07/24 11:18 AM  Result Value Ref  Range   Glucose-Capillary 132 (H) 70 - 99 mg/dL    Comment: Glucose reference range applies only to samples taken after fasting for at least 8 hours.   Comment 1 Notify RN    Comment 2 Document in Chart   Glucose, capillary     Status: Abnormal   Collection Time: 08/07/24  1:30 PM  Result Value Ref Range   Glucose-Capillary 154 (H) 70 - 99 mg/dL    Comment: Glucose reference range applies only to samples taken after fasting for at least 8 hours.   VAS US  LOWER EXTREMITY VENOUS (DVT) Result Date: 08/07/2024  Lower Venous DVT Study Patient Name:  ZAHKI HOOGENDOORN  Date of Exam:   08/07/2024 Medical Rec #: [id number]       Accession #:    [id number] Date of Birth: 18-Jul-1971       Patient Gender: M Patient Age:   34 years Exam Location:  [facility] Procedure:      VAS US  LOWER EXTREMITY VENOUS (DVT) Referring Phys: JEFFREY HATCHER --------------------------------------------------------------------------------   Indications: SOB, and Edema (L>R).  Risk Factors: PE 2025 DVT LLE peroneal 2024. Anticoagulation: Eliquis  (non compliant). Comparison Study: Previous exam on 01/09/2024 showed partial thrombus in left                   peroneal vein. Performing Technologist: Ezzie Potters RVT, RDMS  Examination Guidelines: A complete evaluation includes B-mode imaging, spectral Doppler, color Doppler, and power Doppler as needed of all accessible portions of each vessel. Bilateral testing is considered an integral part of a complete examination. Limited examinations for reoccurring indications may be performed as noted. The reflux portion of the exam is performed with the patient in reverse Trendelenburg.  +---------+---------------+---------+-----------+----------+--------------+ RIGHT    CompressibilityPhasicitySpontaneityPropertiesThrombus Aging +---------+---------------+---------+-----------+----------+--------------+ CFV      Full           Yes      Yes                                 +---------+---------------+---------+-----------+----------+--------------+ SFJ      Full                                                        +---------+---------------+---------+-----------+----------+--------------+ FV Prox  Full           Yes      Yes                                 +---------+---------------+---------+-----------+----------+--------------+ FV Mid   Full           Yes      Yes                                 +---------+---------------+---------+-----------+----------+--------------+ FV DistalFull           Yes      Yes                                 +---------+---------------+---------+-----------+----------+--------------+ PFV      Full                                                        +---------+---------------+---------+-----------+----------+--------------+  POP      Full           Yes      Yes                                  +---------+---------------+---------+-----------+----------+--------------+ PTV      Full                                                        +---------+---------------+---------+-----------+----------+--------------+ PERO     Full                                                        +---------+---------------+---------+-----------+----------+--------------+   +---------+---------------+---------+-----------+----------+--------------+ LEFT     CompressibilityPhasicitySpontaneityPropertiesThrombus Aging +---------+---------------+---------+-----------+----------+--------------+ CFV      Full           Yes      Yes                                 +---------+---------------+---------+-----------+----------+--------------+ SFJ      Full                                                        +---------+---------------+---------+-----------+----------+--------------+ FV Prox  Full           Yes      Yes                                 +---------+---------------+---------+-----------+----------+--------------+ FV Mid   Full           Yes      Yes                                 +---------+---------------+---------+-----------+----------+--------------+ FV DistalFull           Yes      Yes                                 +---------+---------------+---------+-----------+----------+--------------+ PFV      Full                                                        +---------+---------------+---------+-----------+----------+--------------+ POP      Full           Yes      Yes                                 +---------+---------------+---------+-----------+----------+--------------+ PTV  Full                                                        +---------+---------------+---------+-----------+----------+--------------+ PERO     Full                                                         +---------+---------------+---------+-----------+----------+--------------+     Summary: BILATERAL: - No evidence of deep vein thrombosis seen in the lower extremities, bilaterally. -No evidence of popliteal cyst, bilaterally. -Subcutaneous edema, bilaterally.  *See table(s) above for measurements and observations.    Preliminary    NM Pulmonary Perfusion Result Date: 08/06/2024 EXAM: NM Lung Perfusion Scan. CLINICAL HISTORY: Pulmonary embolism (PE) suspected, high prob. TECHNIQUE: Radiolabeled MAA was administered intravenously and planar images of the lungs were obtained in multiple projections. RADIOPHARMACEUTICAL: 4.4 millicurie technetium-28m albumin  aggregated (MAA) injection solution. COMPARISON: X-ray at 11:45. FINDINGS: PERFUSION: No wedge-shaped peripheral perfusion defect within left or right lung to suggest acute pulmonary embolism. Normal perfusion pattern. IMPRESSION: 1. No perfusion defects to indicate pulmonary embolism. Electronically signed by: Norleen Boxer MD 08/06/2024 02:35 PM EST RP Workstation: [id number]   ECHOCARDIOGRAM COMPLETE Result Date: 08/06/2024    ECHOCARDIOGRAM REPORT   Patient Name:   ILIJAH DOUCET Date of Exam: 08/06/2024 Medical Rec #:  [id number]      Height:       68.0 in Accession #:    [id number]     Weight:       245.0 lb Date of Birth:  [birth year]      BSA:          2.228 m Patient Age:    52 years       BP:           158/102 mmHg Patient Gender: M              HR:           83 bpm. Exam Location:  Inpatient Procedure: 2D Echo (Both Spectral and Color Flow Doppler were utilized during            procedure). STAT ECHO Indications:    Dyspnea R06.00  History:        Patient has prior history of Echocardiogram examinations, most                 recent 01/10/2024. Cardiomegaly, CAD, Pulmonary HTN,                 Signs/Symptoms:Chest Pain and Dyspnea; Risk                 Factors:Hypertension, Diabetes and Hyperlipidemia.  Sonographer:    BERNARDA ROCKS Referring Phys:  [id number] HAYLEY N NAASZ IMPRESSIONS  1. Left ventricular ejection fraction, by estimation, is 60 to 65%. The left ventricle has normal function. The left ventricle has no regional wall motion abnormalities. Indeterminate diastolic filling due to E-A fusion.  2. Right ventricular systolic function is normal. The right ventricular size is normal. Tricuspid regurgitation signal is inadequate for assessing PA pressure.  3. Left atrial size was moderately dilated.  4. The mitral valve is normal in structure. No  evidence of mitral valve regurgitation. No evidence of mitral stenosis.  5. The aortic valve was not well visualized. Aortic valve regurgitation is not visualized. No aortic stenosis is present.  6. The inferior vena cava is normal in size with greater than 50% respiratory variability, suggesting right atrial pressure of 3 mmHg. Comparison(s): No significant change from prior study. Prior images reviewed side by side. FINDINGS  Left Ventricle: Left ventricular ejection fraction, by estimation, is 60 to 65%. The left ventricle has normal function. The left ventricle has no regional wall motion abnormalities. The left ventricular internal cavity size was normal in size. There is  no left ventricular hypertrophy. Indeterminate diastolic filling due to E-A fusion. Right Ventricle: The right ventricular size is normal. No increase in right ventricular wall thickness. Right ventricular systolic function is normal. Tricuspid regurgitation signal is inadequate for assessing PA pressure. Left Atrium: Left atrial size was moderately dilated. Right Atrium: Right atrial size was normal in size. Pericardium: There is no evidence of pericardial effusion. Mitral Valve: The mitral valve is normal in structure. No evidence of mitral valve regurgitation. No evidence of mitral valve stenosis. MV peak gradient, 9.5 mmHg. The mean mitral valve gradient is 3.0 mmHg. Tricuspid Valve: The tricuspid valve is normal in structure. Tricuspid  valve regurgitation is not demonstrated. No evidence of tricuspid stenosis. Aortic Valve: The aortic valve was not well visualized. Aortic valve regurgitation is not visualized. No aortic stenosis is present. Aortic valve mean gradient measures 3.0 mmHg. Aortic valve peak gradient measures 6.8 mmHg. Aortic valve area, by VTI measures 4.35 cm. Pulmonic Valve: The pulmonic valve was normal in structure. Pulmonic valve regurgitation is not visualized. No evidence of pulmonic stenosis. Aorta: The aortic root and ascending aorta are structurally normal, with no evidence of dilitation. Venous: The inferior vena cava is normal in size with greater than 50% respiratory variability, suggesting right atrial pressure of 3 mmHg. IAS/Shunts: No atrial level shunt detected by color flow Doppler.  LEFT VENTRICLE PLAX 2D LVIDd:         5.50 cm      Diastology LVIDs:         3.30 cm      LV e' medial:    6.74 cm/s LV PW:         0.90 cm      LV E/e' medial:  22.0 LV IVS:        1.00 cm      LV e' lateral:   9.03 cm/s LVOT diam:     2.30 cm      LV E/e' lateral: 16.4 LV SV:         92 LV SV Index:   41 LVOT Area:     4.15 cm  LV Volumes (MOD) LV vol d, MOD A2C: 173.0 ml LV vol d, MOD A4C: 183.0 ml LV vol s, MOD A2C: 57.9 ml LV vol s, MOD A4C: 80.0 ml LV SV MOD A2C:     115.1 ml LV SV MOD A4C:     183.0 ml LV SV MOD BP:      115.6 ml RIGHT VENTRICLE RV Basal diam:  3.60 cm RV S prime:     12.60 cm/s TAPSE (M-mode): 2.1 cm LEFT ATRIUM              Index        RIGHT ATRIUM           Index LA diam:  5.00 cm  2.24 cm/m   RA Area:     13.70 cm LA Vol (A2C):   82.4 ml  36.99 ml/m  RA Volume:   27.50 ml  12.34 ml/m LA Vol (A4C):   109.0 ml 48.93 ml/m LA Biplane Vol: 97.2 ml  43.63 ml/m  AORTIC VALVE                    PULMONIC VALVE AV Area (Vmax):    4.22 cm     PV Vmax:          1.01 m/s AV Area (Vmean):   3.57 cm     PV Peak grad:     4.0 mmHg AV Area (VTI):     4.35 cm     PR End Diast Vel: 2.93 msec AV Vmax:            130.00 cm/s AV Vmean:          82.700 cm/s AV VTI:            0.211 m AV Peak Grad:      6.8 mmHg AV Mean Grad:      3.0 mmHg LVOT Vmax:         132.00 cm/s LVOT Vmean:        71.000 cm/s LVOT VTI:          0.221 m LVOT/AV VTI ratio: 1.05  AORTA Ao Root diam: 3.40 cm Ao Asc diam:  3.60 cm MITRAL VALVE MV Area (PHT): 7.74 cm     SHUNTS MV Area VTI:   3.35 cm     Systemic VTI:  0.22 m MV Peak grad:  9.5 mmHg     Systemic Diam: 2.30 cm MV Mean grad:  3.0 mmHg MV Vmax:       1.54 m/s MV Vmean:      77.6 cm/s MV Decel Time: 98 msec MV E velocity: 148.00 cm/s MV A velocity: 109.00 cm/s MV E/A ratio:  1.36 Stanly Leavens MD Electronically signed by Stanly Leavens MD Signature Date/Time: 08/06/2024/12:04:35 PM    Final    DG Chest 2 View Result Date: 08/06/2024 CLINICAL DATA:  Shortness of breath. EXAM: CHEST - 2 VIEW COMPARISON:  January 09, 2024 FINDINGS: The cardiac silhouette is mildly enlarged and unchanged in size. There is prominence of the pulmonary vasculature with mild, diffusely increased interstitial lung markings noted. No focal consolidation, pleural effusion or pneumothorax is identified. The visualized skeletal structures are unremarkable. IMPRESSION: Mild cardiomegaly with mild pulmonary vascular congestion. Electronically Signed   By: Suzen Dials M.D.   On: 08/06/2024 02:56    PMH:   Past Medical History:  Diagnosis Date   Acute gangrenous appendicitis 09/25/2022   Anxiety    CHF (congestive heart failure) (HCC)    Coronary artery disease    Depression    Diabetes mellitus without complication (HCC)    Hypertension     PSH:   Past Surgical History:  Procedure Laterality Date   INCISION AND DRAINAGE ABSCESS N/A 09/15/2023   Procedure: INCISION AND DRAINAGE OF BACK ABSCESS;  Surgeon: Belinda Cough, MD;  Location: [facility] OR;  Service: General;  Laterality: N/A;   LAPAROSCOPIC APPENDECTOMY N/A 09/26/2022   Procedure: APPENDECTOMY LAPAROSCOPIC;  Surgeon: Lane Shope,  MD;  Location: [facility] ORS;  Service: General;  Laterality: N/A;   LEFT HEART CATH AND CORONARY ANGIOGRAPHY N/A 03/21/2022   Procedure: LEFT HEART CATH AND CORONARY ANGIOGRAPHY;  Surgeon: Dann Candyce RAMAN, MD;  Location:  [facility] INVASIVE [facility];  Service: Cardiovascular;  Laterality: N/A;    Allergies: No Known Allergies  Medications:   Prior to Admission medications   Medication Sig Start Date End Date Taking? Authorizing Provider  albuterol  (VENTOLIN  HFA) 108 (90 Base) MCG/ACT inhaler Inhale 2 puffs into the lungs every 6 (six) hours as needed for wheezing or shortness of breath. 05/27/24  Yes Newlin, Enobong, MD  apixaban  (ELIQUIS ) 5 MG TABS tablet Take 1 tablet (5 mg total) by mouth 2 (two) times daily. 05/27/24  Yes Newlin, Enobong, MD  atorvastatin  (LIPITOR ) 80 MG tablet Take 1 tablet (80 mg total) by mouth daily. Stop the rosuvastatin . 07/23/24  Yes Newlin, Corrina, MD  carvedilol  (COREG ) 12.5 MG tablet Take 1 tablet (12.5 mg total) by mouth 2 (two) times daily with a meal. 05/27/24  Yes Newlin, Enobong, MD  dorzolamide -timolol  (COSOPT ) 2-0.5 % ophthalmic solution Place 1 drop into both eyes 2 (two) times daily. 02/27/24  Yes   FLUoxetine  (PROZAC ) 20 MG capsule Take 1 capsule (20 mg total) by mouth daily. 06/20/24  Yes Newlin, Enobong, MD  hydrALAZINE  (APRESOLINE ) 10 MG tablet Take 1 tablet (10 mg total) by mouth 3 (three) times daily. 06/12/24  Yes Celestia Rosaline SQUIBB, NP  insulin  glargine, 2 Unit Dial , (TOUJEO  MAX SOLOSTAR) 300 UNIT/ML Solostar Pen Inject 52 Units into the skin daily. 05/27/24  Yes Newlin, Enobong, MD  insulin  lispro (HUMALOG ) 100 UNIT/ML KwikPen Inject 8 Units into the skin 3 (three) times daily before meals. 05/27/24  Yes Newlin, Enobong, MD  latanoprost  (XALATAN ) 0.005 % ophthalmic solution Administer 1 drop into the left eye nightly. 02/15/24  Yes   pantoprazole  (PROTONIX ) 40 MG tablet Take 1 tablet (40 mg total) by mouth daily. 05/27/24  Yes Newlin, Enobong, MD  torsemide   (DEMADEX ) 100 MG tablet Take 1 tablet (100 mg total) by mouth 2 (two) times daily. 05/27/24  Yes Newlin, Enobong, MD  blood glucose meter kit and supplies Dispense based on patient and insurance preference. Use up to four times daily as directed. (FOR ICD-10 E10.9, E11.9). 05/24/22   [place], Quintin PARAS, MD  Continuous Glucose Receiver (FREESTYLE LIBRE 3 READER) DEVI Use to check blood sugar continuously. 07/08/24   Newlin, Enobong, MD  Continuous Glucose Sensor (FREESTYLE LIBRE 3 PLUS SENSOR) MISC Change sensor every 15 days. Use to check blood sugar continuously. 07/08/24   Newlin, Enobong, MD  Insulin  Pen Needle 32G X 4 MM MISC Use with insulin  05/27/24   Newlin, Enobong, MD  Semaglutide,0.25 or 0.5MG /DOS, (OZEMPIC, 0.25 OR 0.5 MG/DOSE,) 2 MG/3ML SOPN Inject 0.25 mg into the skin once a week. For 4 weeks. Then, increase to 0.5 mg weekly thereafter. Patient not taking: Reported on 08/06/2024 07/10/24   Newlin, Enobong, MD    Discontinued Meds:   Medications Discontinued During This Encounter  Medication Reason   cefTRIAXone  (ROCEPHIN ) 1 g in sodium chloride  0.9 % 100 mL IVPB    latanoprost  (XALATAN ) 0.005 % ophthalmic solution Duplicate   albuterol  (PROVENTIL ) (2.5 MG/3ML) 0.083% nebulizer solution 2.5 mg    dorzolamide -timolol  (COSOPT ) 2-0.5 % ophthalmic solution Duplicate   latanoprost  (XALATAN ) 0.005 % ophthalmic solution Duplicate   azithromycin  (ZITHROMAX ) 500 mg in sodium chloride  0.9 % 250 mL IVPB    cefTRIAXone  (ROCEPHIN ) 2 g in sodium chloride  0.9 % 100 mL IVPB    iron sucrose (VENOFER) 500 mg in sodium chloride  0.9 % 250 mL IVPB     Social History:  reports that he has never smoked. He has  never used smokeless tobacco. He reports current alcohol use. He reports that he does not use drugs.  Family History:   Family History  Problem Relation Age of Onset   Heart failure Mother     Blood pressure 136/78, pulse 83, temperature 98.5 F (36.9 C), temperature source Oral, resp. rate 17,  height 5' 6 (1.676 m), weight 110.7 kg, SpO2 95%. General appearance: alert, cooperative, and appears stated age Head: Normocephalic, without obvious abnormality, atraumatic Eyes: negative Neck: no adenopathy, no carotid bruit, supple, symmetrical, trachea midline, and thyroid not enlarged, symmetric, no tenderness/mass/nodules Back: negative Resp: clear to auscultation bilaterally Cardio: regular rate and rhythm GI: soft, non-tender; bowel sounds normal; no masses,  no organomegaly Extremities: edema 2-3+ Pulses: 2+ and symmetric Skin: Skin color, texture, turgor normal. No rashes or lesions       Makenze Ellett, LYNWOOD ORN, MD 08/07/2024, 2:13 PM

## 2024-08-07 NOTE — Evaluation (Signed)
 Occupational Therapy Evaluation Patient Details Name: Leroy Simmons MRN: [id number] DOB: Dec 29, 1970 Today's Date: 08/07/2024   History of Present Illness   Pt is a 53 y.o male admitted 11/4 for BLE edema. Acute on chronic diastolic HF, and acute hypoxic respiratory failure. PMH: CHF, anxiety, depression, T2DM, HTN,  laparoscopic appendectomy 12/23 due to gangrenous appendicitis, NSTEMI, AKI, DVT, CAD, PE     Clinical Impressions Pt admitted based on above, and was seen based on problem list below. PTA pt was independent with ADLs and IADLs. Today pt is requiring set up  to CGA for ADLs. Functional transfers are  CGA without AD. Pt benefits from BUE support of RW. Pt currently limited by decreased activity tolerance, noted BLE pitting edema. Anticipate will progress well, no follow up OT needs. OT will continue to follow acutely to maximize functional independence.     If plan is discharge home, recommend the following:   A little help with walking and/or transfers;A little help with bathing/dressing/bathroom;Assistance with cooking/housework     Functional Status Assessment   Patient has had a recent decline in their functional status and demonstrates the ability to make significant improvements in function in a reasonable and predictable amount of time.     Equipment Recommendations   None recommended by OT      Precautions/Restrictions   Precautions Precautions: Fall Recall of Precautions/Restrictions: Intact Restrictions Weight Bearing Restrictions Per Provider Order: No     Mobility Bed Mobility   General bed mobility comments: received EOB    Transfers Overall transfer level: Needs assistance Equipment used: Rolling walker (2 wheels) Transfers: Sit to/from Stand Sit to Stand: Contact guard assist           General transfer comment: Inital STS and mobility without RW CGA, with RW supervision      Balance Overall balance assessment: Needs  assistance Sitting-balance support: No upper extremity supported, Feet supported Sitting balance-Leahy Scale: Fair     Standing balance support: No upper extremity supported, During functional activity, Reliant on assistive device for balance Standing balance-Leahy Scale: Fair Standing balance comment: Benefits from UE support       ADL either performed or assessed with clinical judgement   ADL Overall ADL's : Needs assistance/impaired Eating/Feeding: Set up;Sitting   Grooming: Contact guard assist;Standing           Upper Body Dressing : Set up;Sitting   Lower Body Dressing: Contact guard assist;Sit to/from stand Lower Body Dressing Details (indicate cue type and reason): Able to figure 4 legs Toilet Transfer: Contact guard assist;Supervision/safety;Rolling walker (2 wheels);Ambulation   Toileting- Clothing Manipulation and Hygiene: Contact guard assist;Sit to/from stand       Functional mobility during ADLs: Contact guard assist;Rolling walker (2 wheels) General ADL Comments: Pt limited by decreased activity tolerance     Vision Baseline Vision/History: 4 Cataracts Patient Visual Report: No change from baseline Vision Assessment?: No apparent visual deficits Additional Comments: Pt with depth perception deficits at baseline            Pertinent Vitals/Pain Pain Assessment Pain Assessment: No/denies pain     Extremity/Trunk Assessment Upper Extremity Assessment Upper Extremity Assessment: Generalized weakness   Lower Extremity Assessment Lower Extremity Assessment: Defer to PT evaluation   Cervical / Trunk Assessment Cervical / Trunk Assessment: Kyphotic   Communication Communication Communication: No apparent difficulties   Cognition Arousal: Alert Behavior During Therapy: WFL for tasks assessed/performed Cognition: No apparent impairments     Following commands: Intact  Cueing  General Comments   Cueing Techniques: Verbal cues  Bil  pitting edema noted in BLEs           Home Living Family/patient expects to be discharged to:: Unsure   Additional Comments: Has rollator that he got last year, unsure of location      Prior Functioning/Environment Prior Level of Function : Independent/Modified Independent             Mobility Comments: Ind, prn use of rollator. reports x2 falls d/t vision deficits ADLs Comments: Ind    OT Problem List: Decreased strength;Decreased range of motion;Decreased activity tolerance;Impaired balance (sitting and/or standing);Impaired vision/perception;Decreased knowledge of use of DME or AE;Decreased knowledge of precautions;Cardiopulmonary status limiting activity   OT Treatment/Interventions: Self-care/ADL training;Therapeutic exercise;Energy conservation;DME and/or AE instruction;Therapeutic activities;Patient/family education;Balance training      OT Goals(Current goals can be found in the care plan section)   Acute Rehab OT Goals Patient Stated Goal: To get better OT Goal Formulation: With patient Time For Goal Achievement: 08/21/24 Potential to Achieve Goals: Good   OT Frequency:  Min 2X/week       AM-PAC OT 6 Clicks Daily Activity     Outcome Measure Help from another person eating meals?: None Help from another person taking care of personal grooming?: A Little Help from another person toileting, which includes using toliet, bedpan, or urinal?: A Little Help from another person bathing (including washing, rinsing, drying)?: A Little Help from another person to put on and taking off regular upper body clothing?: A Little Help from another person to put on and taking off regular lower body clothing?: A Little 6 Click Score: 19   End of Session Equipment Utilized During Treatment: Gait belt;Rolling walker (2 wheels) Nurse Communication: Mobility status  Activity Tolerance: Patient tolerated treatment well Patient left: in chair;with call bell/phone within  reach  OT Visit Diagnosis: Unsteadiness on feet (R26.81);Other abnormalities of gait and mobility (R26.89);Muscle weakness (generalized) (M62.81);History of falling (Z91.81)                Time: 8951-8891 OT Time Calculation (min): 20 min Charges:  OT General Charges $OT Visit: 1 Visit OT Evaluation $OT Eval Moderate Complexity: 1 Mod  Adrianne BROCKS, OT  Acute Rehabilitation Services Office [phone] Secure chat preferred   Adrianne GORMAN Savers 08/07/2024, 2:55 PM

## 2024-08-07 NOTE — Progress Notes (Signed)
 Orthopedic Tech Progress Note Patient Details:  Leroy Simmons 1971/08/08 [id number]  Ortho Devices Type of Ortho Device: Radio broadcast assistant Ortho Device/Splint Location: Bilateral Ortho Device/Splint Interventions: Ordered, Application, Adjustment   Post Interventions Patient Tolerated: Well Instructions Provided: Adjustment of device, Care of device  Farley Crooker F Bear Osten 08/07/2024, 4:53 PM

## 2024-08-07 NOTE — Progress Notes (Signed)
 Order received from Dr. Melia for Leroy Simmons. Page sent to [employer] for call back for assistance in placing [employer]

## 2024-08-08 ENCOUNTER — Ambulatory Visit: Admitting: Pharmacist

## 2024-08-08 DIAGNOSIS — I130 Hypertensive heart and chronic kidney disease with heart failure and stage 1 through stage 4 chronic kidney disease, or unspecified chronic kidney disease: Secondary | ICD-10-CM | POA: Diagnosis not present

## 2024-08-08 DIAGNOSIS — I2510 Atherosclerotic heart disease of native coronary artery without angina pectoris: Secondary | ICD-10-CM | POA: Diagnosis not present

## 2024-08-08 DIAGNOSIS — I5033 Acute on chronic diastolic (congestive) heart failure: Secondary | ICD-10-CM | POA: Diagnosis not present

## 2024-08-08 DIAGNOSIS — E785 Hyperlipidemia, unspecified: Secondary | ICD-10-CM | POA: Diagnosis not present

## 2024-08-08 DIAGNOSIS — N184 Chronic kidney disease, stage 4 (severe): Secondary | ICD-10-CM | POA: Diagnosis not present

## 2024-08-08 DIAGNOSIS — D631 Anemia in chronic kidney disease: Secondary | ICD-10-CM | POA: Diagnosis not present

## 2024-08-08 DIAGNOSIS — E1122 Type 2 diabetes mellitus with diabetic chronic kidney disease: Secondary | ICD-10-CM | POA: Diagnosis not present

## 2024-08-08 DIAGNOSIS — J45909 Unspecified asthma, uncomplicated: Secondary | ICD-10-CM | POA: Diagnosis not present

## 2024-08-08 DIAGNOSIS — Z794 Long term (current) use of insulin: Secondary | ICD-10-CM | POA: Diagnosis not present

## 2024-08-08 DIAGNOSIS — Z79899 Other long term (current) drug therapy: Secondary | ICD-10-CM | POA: Diagnosis not present

## 2024-08-08 LAB — CBC
HCT: 32.2 % — ABNORMAL LOW (ref 39.0–52.0)
Hemoglobin: 10.6 g/dL — ABNORMAL LOW (ref 13.0–17.0)
MCH: 30.3 pg (ref 26.0–34.0)
MCHC: 32.9 g/dL (ref 30.0–36.0)
MCV: 92 fL (ref 80.0–100.0)
Platelets: 266 K/uL (ref 150–400)
RBC: 3.5 MIL/uL — ABNORMAL LOW (ref 4.22–5.81)
RDW: 11.7 % (ref 11.5–15.5)
WBC: 8.7 K/uL (ref 4.0–10.5)
nRBC: 0 % (ref 0.0–0.2)

## 2024-08-08 LAB — VITAMIN D 25 HYDROXY (VIT D DEFICIENCY, FRACTURES): Vit D, 25-Hydroxy: 15.3 ng/mL — ABNORMAL LOW (ref 30–100)

## 2024-08-08 LAB — BASIC METABOLIC PANEL WITH GFR
Anion gap: 14 (ref 5–15)
BUN: 50 mg/dL — ABNORMAL HIGH (ref 6–20)
CO2: 24 mmol/L (ref 22–32)
Calcium: 8.4 mg/dL — ABNORMAL LOW (ref 8.9–10.3)
Chloride: 103 mmol/L (ref 98–111)
Creatinine, Ser: 3.88 mg/dL — ABNORMAL HIGH (ref 0.61–1.24)
GFR, Estimated: 18 mL/min — ABNORMAL LOW (ref >60)
Glucose, Bld: 128 mg/dL — ABNORMAL HIGH (ref 70–99)
Potassium: 3.8 mmol/L (ref 3.5–5.1)
Sodium: 141 mmol/L (ref 135–145)

## 2024-08-08 LAB — MAGNESIUM: Magnesium: 2.1 mg/dL (ref 1.7–2.4)

## 2024-08-08 LAB — GLUCOSE, CAPILLARY
Glucose-Capillary: 135 mg/dL — ABNORMAL HIGH (ref 70–99)
Glucose-Capillary: 201 mg/dL — ABNORMAL HIGH (ref 70–99)
Glucose-Capillary: 119 mg/dL — ABNORMAL HIGH (ref 70–99)
Glucose-Capillary: 154 mg/dL — ABNORMAL HIGH (ref 70–99)

## 2024-08-08 MED ORDER — SACUBITRIL-VALSARTAN 24-26 MG PO TABS
1.0000 | ORAL_TABLET | Freq: Two times a day (BID) | ORAL | Status: DC
  Administered 2024-08-08 – 2024-08-09 (×3): 1 via ORAL
  Filled 2024-08-08 (×3): qty 1

## 2024-08-08 NOTE — TOC CM/SW Note (Signed)
 Transition of Care [facility]) - Inpatient Brief Assessment   Patient Details  Name: TARUS BRISKI MRN: [id number] Date of Birth: [birth year]  [facility] CM/SW Contact:    Waddell Barnie Rama, RN Phone Number: 08/08/2024, 3:56 PM   Clinical Narrative: Homeless, has PCP and insurance on file, states has no HH services in place at this time , has a walker in [place].  States he lives out of his car so he has transportation at [employer] and  has no family  support system, states gets medications from [facility].  Pta self ambulatory.   He will need housing resources and food resources.  NCM asked CSW to assist patient with resources.    Transition of Care Asessment: Insurance and Status: Insurance coverage has been reviewed Patient has primary care physician: Yes Home environment has been reviewed: Homeless Prior level of function:: indep Prior/Current Home Services: Current home services (walker in storage) Social Drivers of Health Review: SDOH reviewed needs interventions Readmission risk has been reviewed: Yes Transition of care needs: transition of care needs identified, TOC will continue to follow

## 2024-08-08 NOTE — Progress Notes (Signed)
 Patient ID: Leroy Simmons, male   DOB: [birth year], 53 y.o.   MRN: [id number] S: No new complaints.  Diuresing well.   O:BP (!) 145/91   Pulse 79   Temp 98 F (36.7 C) (Oral)   Resp 20   Ht 5' 6 (1.676 m)   Wt 111.1 kg   SpO2 98%   BMI 39.53 kg/m   Intake/Output Summary (Last 24 hours) at 08/08/2024 1523 Last data filed at 08/08/2024 1100 Gross per 24 hour  Intake 1068.6 ml  Output 2055 ml  Net -986.4 ml   Intake/Output: I/O last 3 completed shifts: In: 1129.8 [P.O.:720; IV Piggyback:409.8] Out: 2630 [Urine:2630]  Intake/Output this shift:  Total I/O In: 480 [P.O.:480] Out: 475 [Urine:475] Weight change: 0.6 kg Gen: NAD CVS: RRR Resp: bilateral crackles Abd: +BS, soft, NT/ND Ext: 2 + BLE and unna boots in place  Recent Labs  Lab 08/06/24 0146 08/06/24 0739 08/06/24 1546 08/07/24 0322 08/07/24 1115 08/07/24 2007 08/08/24 0559  NA 138  --  141 142 139 140 141  K 4.0  --  3.7 3.7 3.7 4.1 3.8  CL 104  --  105 106 103 104 103  CO2 23  --  24 24 25 24 24   GLUCOSE 180*  --  169* 126* 137* 193* 128*  BUN 48*  --  46* 48* 44* 50* 50*  CREATININE 3.39*  --  3.47* 3.53* 3.45* 3.95* 3.88*  ALBUMIN   --  2.6* 2.6*  --   --  2.4*  --   CALCIUM  8.7*  --  8.3* 8.3* 8.2* 8.3* 8.4*  PHOS  --   --  3.6  --   --  3.8  --   AST  --  22  --   --   --   --   --   ALT  --  26  --   --   --   --   --    Liver Function Tests: Recent Labs  Lab 08/06/24 0739 08/06/24 1546 08/07/24 2007  AST 22  --   --   ALT 26  --   --   ALKPHOS 133*  --   --   BILITOT 0.8  --   --   PROT 6.5  --   --   ALBUMIN  2.6* 2.6* 2.4*   No results for input(s): LIPASE, AMYLASE in the last 168 hours. No results for input(s): AMMONIA in the last 168 hours. CBC: Recent Labs  Lab 08/06/24 0146 08/07/24 0322 08/08/24 0559  WBC 8.9 8.5 8.7  HGB 11.5* 10.2* 10.6*  HCT 34.9* 30.2* 32.2*  MCV 93.6 91.8 92.0  PLT 316 261 266   Cardiac Enzymes: No results for input(s): CKTOTAL, CKMB,  CKMBINDEX, TROPONINI in the last 168 hours. CBG: Recent Labs  Lab 08/07/24 1330 08/07/24 1508 08/07/24 2049 08/08/24 0630 08/08/24 1046  GLUCAP 154* 178* 171* 135* 201*    Iron Studies:  Recent Labs    08/07/24 0322  IRON 28*  TIBC 242*  FERRITIN 116   Studies/Results: VAS US  LOWER EXTREMITY VENOUS (DVT) Result Date: 08/07/2024  Lower Venous DVT Study Patient Name:  Leroy Simmons  Date of Exam:   08/07/2024 Medical Rec #: [id number]       Accession #:    [id number] Date of Birth: 19-Feb-1971       Patient Gender: M Patient Age:   86 years Exam Location:  [facility] Procedure:      VAS US   LOWER EXTREMITY VENOUS (DVT) Referring Phys: JEFFREY HATCHER --------------------------------------------------------------------------------  Indications: SOB, and Edema (L>R).  Risk Factors: PE 2025 DVT LLE peroneal 2024. Anticoagulation: Eliquis  (non compliant). Comparison Study: Previous exam on 01/09/2024 showed partial thrombus in left                   peroneal vein. Performing Technologist: Ezzie Potters RVT, RDMS  Examination Guidelines: A complete evaluation includes B-mode imaging, spectral Doppler, color Doppler, and power Doppler as needed of all accessible portions of each vessel. Bilateral testing is considered an integral part of a complete examination. Limited examinations for reoccurring indications may be performed as noted. The reflux portion of the exam is performed with the patient in reverse Trendelenburg.  +---------+---------------+---------+-----------+----------+--------------+ RIGHT    CompressibilityPhasicitySpontaneityPropertiesThrombus Aging +---------+---------------+---------+-----------+----------+--------------+ CFV      Full           Yes      Yes                                 +---------+---------------+---------+-----------+----------+--------------+ SFJ      Full                                                         +---------+---------------+---------+-----------+----------+--------------+ FV Prox  Full           Yes      Yes                                 +---------+---------------+---------+-----------+----------+--------------+ FV Mid   Full           Yes      Yes                                 +---------+---------------+---------+-----------+----------+--------------+ FV DistalFull           Yes      Yes                                 +---------+---------------+---------+-----------+----------+--------------+ PFV      Full                                                        +---------+---------------+---------+-----------+----------+--------------+ POP      Full           Yes      Yes                                 +---------+---------------+---------+-----------+----------+--------------+ PTV      Full                                                        +---------+---------------+---------+-----------+----------+--------------+ PERO  Full                                                        +---------+---------------+---------+-----------+----------+--------------+   +---------+---------------+---------+-----------+----------+--------------+ LEFT     CompressibilityPhasicitySpontaneityPropertiesThrombus Aging +---------+---------------+---------+-----------+----------+--------------+ CFV      Full           Yes      Yes                                 +---------+---------------+---------+-----------+----------+--------------+ SFJ      Full                                                        +---------+---------------+---------+-----------+----------+--------------+ FV Prox  Full           Yes      Yes                                 +---------+---------------+---------+-----------+----------+--------------+ FV Mid   Full           Yes      Yes                                  +---------+---------------+---------+-----------+----------+--------------+ FV DistalFull           Yes      Yes                                 +---------+---------------+---------+-----------+----------+--------------+ PFV      Full                                                        +---------+---------------+---------+-----------+----------+--------------+ POP      Full           Yes      Yes                                 +---------+---------------+---------+-----------+----------+--------------+ PTV      Full                                                        +---------+---------------+---------+-----------+----------+--------------+ PERO     Full                                                        +---------+---------------+---------+-----------+----------+--------------+     Summary:  BILATERAL: - No evidence of deep vein thrombosis seen in the lower extremities, bilaterally. -No evidence of popliteal cyst, bilaterally. -Subcutaneous edema, bilaterally.  *See table(s) above for measurements and observations. Electronically signed by Lonni Gaskins MD on 08/07/2024 at 4:02:18 PM.    Final     apixaban   5 mg Oral BID   atorvastatin   80 mg Oral QHS   carvedilol   12.5 mg Oral BID WC   dorzolamide -timolol   1 drop Both Eyes BID   hydrALAZINE   10 mg Oral Q8H   influenza vac split trivalent PF  0.5 mL Intramuscular Tomorrow-1000   insulin  aspart  0-20 Units Subcutaneous TID WC   insulin  aspart  0-5 Units Subcutaneous QHS   insulin  aspart  6 Units Subcutaneous TID WC   insulin  glargine-yfgn  25 Units Subcutaneous QHS   latanoprost   1 drop Both Eyes QHS   pantoprazole   40 mg Oral Daily   sacubitril-valsartan  1 tablet Oral BID   senna  1 tablet Oral BID    BMET    Component Value Date/Time   NA 141 08/08/2024 0559   NA 142 07/08/2024 1427   K 3.8 08/08/2024 0559   CL 103 08/08/2024 0559   CO2 24 08/08/2024 0559   GLUCOSE 128 (H) 08/08/2024 0559    BUN 50 (H) 08/08/2024 0559   BUN 40 (H) 07/08/2024 1427   CREATININE 3.88 (H) 08/08/2024 0559   CALCIUM  8.4 (L) 08/08/2024 0559   GFRNONAA 18 (L) 08/08/2024 0559   GFRAA  10/26/2010 1143    >60        The eGFR has been calculated using the MDRD equation. This calculation has not been validated in all clinical situations. eGFR's persistently <60 mL/min signify possible Chronic Kidney Disease.   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  He is followed by Dr. Dennise of [facility] in [place].  He already had a UPC of 5.2 and April 2024, 10 g in April 2025 - Increase in proteinuria is almost certainly secondary to diabetic nephropathy; we can send a limited serologic w/u. He also already has very advanced baseline chronic kidney disease. - Maximize medical therapies such as ACEi/ARB (as tolerated), SGLT2i (on Jardiance ), GLP1 agonist; we can challenge with finerenone as outpt. May be high risk for developing hyperkalemia. JENITA boots  - Lasix  120mg  q8hrs and will monitor response - Scr peaked at 3.95, down to 3.88 today.   -Monitor Daily I/Os, Daily weight  -Maintain MAP>65 for optimal renal perfusion.  - Avoid nephrotoxic agents such as IV contrast, NSAIDs, and phosphate containing bowel preps (FLEETS)   HFpEF LVEF 60% - responding nicely to diuresis. - Already net neg  2.1L - This has been a recurring issue and is not followed by cardiology.  He would benefit from cardiology or advanced heart failure evaluation while he remains an inpatient to help with CHF management and arrange for close outpatient follow up.  Hypertension Renal osteodystrophy -check a 25 vitamin D, PTH  Anemia -TSAT 12% F116 -> already given Venofer 500mg . DM - On Semglee  and Novolog  H/o PE on Eliquis   Fairy RONAL Sellar, MD [facility]

## 2024-08-08 NOTE — Discharge Instructions (Addendum)
 Thank you for allowing us  to be part of your care. You were hospitalized for an exacerbation of your heart and kidney disease. We treated you with medicines that helped you rid of excess fluid and blood pressure medicine.   See the changes in your medications and management of your chronic conditions below:  *For your chronic kidney disease -We have STARTED you on these medications:  - Furosemide  40 mg daily   - Sevelamer 800 mg three times daily with meals    -We have STOPPED the following medications:  - Torsemide  100 mg twice daily, do not take this anymore   *For your heart and blood pressure -We have STARTED you on these medications:  - Carvedilol  25 mg twice daily. You were previously taking 12.5 mg, we increased this to 25 mg twice daily.  -Continue taking your Hydralazine  10 mg three times daily   *For your diabetes -We are changing your insulin  regimen:  - Inject 32 units of Glargine daily   - Inject 6 units of Lispro three times a day   *For your itching  -We sent you home with five days worth of Hydroxyzine  25 mg. Please take one tablet three times daily as needed for itching.   FOLLOW UP APPOINTMENTS: Please see your primary care doctor within 10 days of hospital discharge.   Please go to your cardiology appointment on 09/12/2024.   You should be contacted about scheduling an appointment with the kidney doctors.   Please call your PCP or our clinic if you have any questions or concerns, we may be able to help and keep you from a long and expensive emergency room wait. Our clinic and after hours phone number is [phone]. The best time to call is Monday through Friday 9 am to 4 pm but there is always someone available 24/7 if you have an emergency. If you need medication refills please notify your pharmacy one week in advance and they will send us  a request.   We are glad you are feeling better,  Leroy Simmons Internal Medicine Inpatient Teaching Service at [facility] assistance programs Crisis assistance programs  -[facility] Coordinated Entry Program. If you are experiencing homelessness in [place], [place] , your first point of contact should be Pensions Consultant. You can reach Coordinated Entry by calling [phone] or by emailing [email or web address] [email or web address].  Community access points: [employer] [phone], [place]) every Tuesday from 9am-10am. [facility] ([address] [place]) every Wednesday from 8am-9am.   -[place] Coordinated Re-entry Daniel [place]: Dial  211 and request. Offers referrals to homeless shelters in the area.    -The [employer] [phone]) offers several services to local families, as funding allows. The Emergency Assistance Program (EAP), which they administer, provides household goods, free food, clothing, and financial aid to people in need in the [place]  area. The EAP program does have some qualification, and counselors will interview clients for financial assistance by written referral only. Referrals need to be made by the Department of Social Services or by other EAP approved human services agencies or charities in the area.  -[facility] of [employer], which can be reached at [phone], offers emergency assistance programs for those in need of help, such as food, rent assistance, a soup kitchen, shelter, and clothing. They are based in [facility] [place]  but provide a number of services to those that qualify for assistance.   -  [facility] Department of Social Services may be able to offer temporary financial assistance and cash grants for paying rent and utilities, Help may be provided for local county residents who may be experiencing personal crisis when other resources, including government programs, are not available. Call [phone]  -High [employer] is a  [employer] agency, The organization can offer emergency assistance for paying rent, caremark rx, utilities, food, household products and furniture. They offer extensive emergency and transitional housing for families, children and single women, and also run a Boy's and [employer]. Thrift Shops, Secondary School Teacher, and other aid offered too. [address] [place], [place]  [address], [phone]  -[place] Low Income Energy Assistance Program -- This is offered for [facility] families. The federal government created [employer] Program provides a one-time cash grant payment to help eligible low-income families pay their electric and heating bills. [address], [place], [place]  [address], [phone]  -[place] Emergency Assistance -- A program offers emergency utility and rent funds for greater [employer] area residents. The program can also provide counseling and referrals to charities and government programs. Also provides food and a free meal program that serves lunch Mondays - Saturdays and dinner seven days per week to individuals in the community. [address] [place], [place]  [address], [phone]  -[employer] - Offers affordable apartment and housing communities across      [place] and [place]. The low income and seniors can access public housing, rental assistance to qualified applicants, and apply for the section 8 rent subsidy program. Other programs include Chiropractor and Engineer, Maintenance. [address] [place], [place]  [address], dial  [phone].  -The [facility] provides transitional housing to veterans and the disabled. Clients will also access other services too, including assistance in applying for Disability, life skills classes, case management, and assistance in finding permanent housing. [address],  [place], [place]  [address], call [phone]  -[place] Transitional Housing through [employer] is for people who were just evicted or that are formerly homeless. The non-profit will also help then gain self-sufficiency, find a home or apartment to live in, and also provides information on rent assistance when needed. Phone [phone]  -The [facility] helps low income, elderly, or disabled residents in seven counties in the [place] ([place], [place], [place], [place], [place], [place], [place], and [place]) save energy and reduce their utility bills by improving energy efficiency. Phone [phone].  -[employer] is located in the [place] [facility] in the [employer], [address], [place], [place] [address]. Parking is in the rear of the building. Phone: [phone]   General Email: info@gsohc .org  GHC provides free housing counseling assistance in locating affordable rental housing or housing with support services for families and individuals in crisis and the chronically homeless. We provide potential resources for other housing needs like utilities. Our trained counselors also work with clients on budgeting and financial literacy in effort to empower them to take control of their financial situations. [employer] collaborates with homeless service providers and other stakeholders as part of the [employer] COC (Continuum of Care). The (COC) is a regional/local planning body that coordinates housing and services funding for homeless families and individuals. The role of [facility] in the COC is through housing counseling to work with people we serve on diversion  strategies for those that are at imminent risk of becoming homeless. We also work with the Coordinated Assessment/Entry Specialist who attempts to find temporary solutions and/or  connects the people to Housing First, Rapid Re-housing or transitional housing programs. Our Homelessness Prevention Housing Counselors meet with clients on business days (Monday-Fridays, except scheduled holidays) from 8:30 am to 4:30 pm.  Legal assistance for evictions, foreclosure, and more -If you need free legal advice on civil issues, such as foreclosures, evictions, electronics engineer, government programs, domestic issues and more, Landscape Architect of [place]  [facility]) is a associate professor firm that provides free legal services and counsel to lower income people, seniors, disabled, and others, The goal is to ensure everyone has access to justice and fair representation. Call them at [phone].  [facility] can provide info about obtaining legal assistance with evictions. Phone [phone].  Data Processing Manager  The [employer], [employer]. offers job and dispensing optician. Resources are focused on helping students obtain the skills and experiences that are necessary to compete in today's challenging and tight job market. The non-profit faith-based community action agency offers internship trainings as well as classroom instruction. Classes are tailored to meet the needs of people in the [facility] region. [place], [place] [address], [phone]  Foreclosure prevention/Debt Services Family Services of the [employer] Credit Counseling Service inludes debt and foreclosure prevention programs for local families. This includes money management, financial advice, budget review and development of a written action plan with a pensions consultant to help solve specific individual financial problems. In addition, housing and mortgage counselors can also provide pre- and post-purchase homeownership counseling, default resolution counseling (to prevent foreclosure) and reverse mortgage counseling. A Debt  Management Program allows people and families with a high level of credit card or medical debt to consolidate and repay consumer debt and loans to creditors and rebuild positive credit ratings and scores. Contact [phone] [phone].  Community clinics in [place] -Health Department [facility]: [address] [place], [place], [address]. [phone].  -Health Department [place] [facility]: [address], [facility], [address]. [phone].  -[facility] offers medical care through a group of doctors, pharmacies and other healthcare related agencies that offer services for low income, uninsured adults in [place]. Also offers adult Dental care and assistance with applying for an [employer]. Call [phone].   [facility]. This center provides low-cost health care to those without health insurance. Services offered include an onsite pharmacy. Phone [phone]. [address], [place].  -Medication Assistance Program serves as a link between pharmaceutical companies and patients to provide low cost or free prescription medications. This service is available for residents who meet certain income restrictions and have no insurance coverage. PLEASE CALL [phone] [place]) OR [phone] ([place])  -One Step Further: Materials Engineer, The [employer] Support & Nutrition Program, [place]. Call [phone]/ [phone].  Food pantry and assistance -[facility]-Food Bank: [address], [place] [address]. Phone [phone]  -[facility] Food Pantry: [address] [place], [place] [address]. [phone].  -Missionary Ministry: has the purpose of visiting the sick and shut-ins and provide for needs in the surrounding communities. Call [phone]. Email: [email or web address] This program provides: Food box for seniors, Financial assistance,  Food to meet basic nutritional needs.  -Meals on Wheels with Senior Resources: [facility]  residents age 45 and over who are homebound and unable to obtain and prepare a nutritious meal for themselves are eligible for this service. There may be a waiting list in certain parts of [facility] if the route in that area is full. If you are in [facility] and [place] call [phone] to register. For all other areas call [phone] to register.  -Greater Dietitian: https://findfood.bargaincontractor.si  TRANSPORTATION: -[employer] Department of Health: Call [facility] and [employer] at [phone] for details. attractionguides.es  -Access [place]: Access [place] is the [employer]'s shared-ride transportation service for eligible riders who have a disability that prevents them from riding the fixed route bus. Call [phone]. Access [place] riders must pay a fare of $1.50 per trip, or may purchase a 10-ride punch card for $14.00 ($1.40 per ride) or a 40-ride punch card for $48.00 ($1.20 per ride).  -The Shepherd's WHEELS rideshare transportation service is provided for senior citizens (60+) who live independently within [place] city limits and are unable to drive or have limited access to transportation. Call [phone] to schedule an appointment.  -[place] Transportation: For Medicare or Medicaid recipients call [phone]?[facility] Ambulance, wheelchair fleeta, and ambulatory quotes available.   FLEEING VIOLENCE: -Family Services of the [place]- 24/7 Crisis line [phone]) -[facility] Justice Centers: (336) [phone]-[phone] [phone])  [place] 2-1-1 is another useful way to locate resources in the community. Visit shedsizes.ch to find service information online. If you need additional assistance, 2-1-1 Referral Specialists are available 24 hours a day,  every day by dialing 2-1-1 or [phone] from any phone. The call is free, confidential, and available in any language.  Affordable Housing Search http://www.nchousingsearch.[facility])   M-F 8a-3p [address] [place]  [place], [place] [address] [phone] Services include: laundry, barbering, support groups, case management, phone & computer access, showers, AA/NA mtgs, mental health/substance abuse nurse, job skills class, disability information, VA assistance, spiritual classes, etc. Winter Shelter available when temperatures are less than 32 degrees.   HOMELESS SHELTERS [facility] at [facility]- Call [phone] ext. [phone] or ext. [phone]. Located at [address] [place], [place] [address]  [facility] Mens [facility]- Call [phone]. Located at [address] [place] [address].  [facility] [employer]. Call [phone]. Office located at [address], [place] [address].  [facility] Family [facility] through [place] [phone].  [facility]- Call [phone]. Located at [address] [place], [place], [place] [address].  Room at the Inn-For Pregnant mothers. Call [phone]. Located at [address] [place], [address].  [facility]-For men in [place]. Call [phone]. [facility]ter in [place]. Call [phone].  Home of [employer] for [employer] [phone]. Office located at [address], [place], [address].  [employer] be agreeable to help with chores. Call [phone] ext. [phone].  Men's: [address] [place], [place] [address]. Women's: [facility]  [address] [place], [place] [address]  Crisis Services [facility] Mobile Crisis Management- [phone]  [facility] [address] [place], [place] [address]. Phone: ([phone])  [phone]  ____________________________________________________________________  Low Sodium Nutrition Therapy  Eating less sodium can help you if you have high blood pressure, heart failure, or kidney or liver disease.   Your body needs a little sodium, but too much sodium can cause your body to hold onto extra  water. This extra water will raise your blood pressure and can cause damage to your heart, kidneys, or liver as they are forced to work harder.   Sometimes you can see how the extra fluid affects you because your hands, legs, or belly swell. You may also hold water around your heart and lungs, which makes it hard to breathe.   Even if you take medication for blood pressure or a water pill (diuretic) to remove fluid, it is still important to have less salt in your diet.   Check with your primary care provider before drinking alcohol since it may affect the amount of fluid in your body and how your heart, kidneys, or liver work. Sodium in Food A low-sodium meal plan limits the sodium that you get from food and beverages to 1,500-2,000 milligrams (mg) per day. Salt is the main source of sodium. Read the nutrition label on the package to find out how much sodium is in one serving of a food.  Select foods with 140 milligrams (mg) of sodium or less per serving.  You may be able to eat one or two servings of foods with a little more than 140 milligrams (mg) of sodium if you are closely watching how much sodium you eat in a day.  Check the serving size on the label. The amount of sodium listed on the label shows the amount in one serving of the food. So, if you eat more than one serving, you will get more sodium than the amount listed.  Tips Cutting Back on Sodium Eat more fresh foods.  Fresh fruits and vegetables are low in sodium, as well as frozen vegetables and fruits that have no added juices or sauces.  Fresh meats are lower in sodium than processed meats, such as bacon, sausage, and  hotdogs.  Not all processed foods are unhealthy, but some processed foods may have too much sodium.  Eat less salt at the table and when cooking. One of the ingredients in salt is sodium.  One teaspoon of table salt has 2,300 milligrams of sodium.  Leave the salt out of recipes for pasta, casseroles, and soups. Be a engineer, building services.  Food packages that say "Salt-free", sodium-free", "very low sodium," and "low sodium" have less than 140 milligrams of sodium per serving.  Beware of products identified as "Unsalted," "No Salt Added," "Reduced Sodium," or "Lower Sodium." These items may still be high in sodium. You should always check the nutrition label. Add flavors to your food without adding sodium.  Try lemon juice, lime juice, or vinegar.  Dry or fresh herbs add flavor.  Buy a sodium-free seasoning blend or make your own at home. You can purchase salt-free or sodium-free condiments like barbeque sauce in stores and online. Ask your registered dietitian nutritionist for recommendations and where to find them.   Eating in Restaurants Choose foods carefully when you eat outside your home. Restaurant foods can be very high in sodium. Many restaurants provide nutrition facts on their menus or their websites. If you cannot find that information, ask your server. Let your server know that you want your food to be cooked without salt and that you would like your salad dressing and sauces to be served on the side.    Foods Recommended Food Group Foods Recommended  Grains Bread, bagels, rolls without salted tops Homemade bread made with reduced-sodium baking powder Cold cereals, especially shredded wheat and puffed rice Oats, grits, or cream of wheat Pastas, quinoa, and rice Popcorn,  pretzels or crackers without salt Corn tortillas  Protein Foods Fresh meats and fish; turkey bacon (check the nutrition labels - make sure they are not packaged in a sodium solution) Canned or packed tuna (no more than  4 ounces at 1 serving) Beans and peas Soybeans) and tofu Eggs Nuts or nut butters without salt  Dairy Milk or milk powder Plant milks, such as rice and soy Yogurt, including Greek yogurt Small amounts of natural cheese (blocks of cheese) or reduced-sodium cheese can be used in moderation. (Swiss, ricotta, and fresh mozzarella cheese are lower in sodium than the others) Cream Cheese Low sodium cottage cheese  Vegetables Fresh and frozen vegetables without added sauces or salt Homemade soups (without salt) Low-sodium, salt-free or sodium-free canned vegetables and soups  Fruit Fresh and canned fruits Dried fruits, such as raisins, cranberries, and prunes  Oils Tub or liquid margarine, regular or without salt Canola, corn, peanut, olive, safflower, or sunflower oils  Condiments Fresh or dried herbs such as basil, bay leaf, dill, mustard (dry), nutmeg, paprika, parsley, rosemary, sage, or thyme.  Low sodium ketchup Vinegar  Lemon or lime juice Pepper, red pepper flakes, and cayenne. Hot sauce contains sodium, but if you use just a drop or two, it will not add up to much.  Salt-free or sodium-free seasoning mixes and marinades Simple salad dressings: vinegar and oil   Foods Not Recommended Food Group Foods Not Recommended  Grains Breads or crackers topped with salt Cereals (hot/cold) with more than 300 mg sodium per serving Biscuits, cornbread, and other "quick" breads prepared with baking soda Pre-packaged bread crumbs Seasoned and packaged rice and pasta mixes Self-rising flours  Protein Foods Cured meats: Bacon, ham, sausage, pepperoni and hot dogs Canned meats (chili, vienna sausage, or sardines) Smoked fish and meats Frozen meals that have more than 600 mg of sodium per serving Egg substitute (with added sodium)  Dairy Buttermilk Processed cheese spreads Cottage cheese (1 cup may have over 500 mg of sodium; look for low-sodium.) American or feta cheese Shredded Cheese has  more sodium than blocks of cheese String cheese  Vegetables Canned vegetables (unless they are salt-free, sodium-free or low sodium) Frozen vegetables with seasoning and sauces Sauerkraut and pickled vegetables Canned or dried soups (unless they are salt-free, sodium-free, or low sodium) French fries and onion rings  Fruit Dried fruits preserved with additives that have sodium  Oils Salted butter or margarine, all types of olives  Condiments Salt, sea salt, kosher salt, onion salt, and garlic salt Seasoning mixes with salt Bouillon cubes Ketchup Barbeque sauce and Worcestershire sauce unless low sodium Soy sauce Salsa, pickles, olives, relish Salad dressings: ranch, blue cheese, Italian, and French.   Low Sodium Sample 1-Day Menu  Breakfast 1 cup cooked oatmeal  1 slice whole wheat bread toast  1 tablespoon peanut butter without salt  1 banana  1 cup 1% milk  Lunch Tacos made with: 2 corn tortillas   cup black beans, low sodium   cup roasted or grilled chicken (without skin)   avocado  Squeeze of lime juice  1 cup salad greens  1 tablespoon low-sodium salad dressing   cup strawberries  1 orange  Afternoon Snack 1/3 cup grapes  6 ounces yogurt  Evening Meal 3 ounces herb-baked fish  1 baked potato  2 teaspoons olive oil   cup cooked carrots  2 thick slices tomatoes on:  2 lettuce leaves  1 teaspoon olive oil  1 teaspoon balsamic vinegar  1 cup  1% milk  Evening Snack 1 apple   cup almonds without salt   Low-Sodium Vegetarian (Lacto-Ovo) Sample 1-Day Menu  Breakfast 1 cup cooked oatmeal  1 slice whole wheat toast  1 tablespoon peanut butter without salt  1 banana  1 cup 1% milk  Lunch Tacos made with: 2 corn tortillas   cup black beans, low sodium   cup roasted or grilled chicken (without skin)   avocado  Squeeze of lime juice  1 cup salad greens  1 tablespoon low-sodium salad dressing   cup strawberries  1 orange  Evening Meal Stir fry made with:   cup tofu  1 cup brown rice   cup broccoli   cup green beans   cup peppers   tablespoon peanut oil  1 orange  1 cup 1% milk  Evening Snack 4 strips celery  2 tablespoons hummus  1 hard-boiled egg   Low-Sodium Vegan Sample 1-Day Menu  Breakfast 1 cup cooked oatmeal  1 tablespoon peanut butter without salt  1 cup blueberries  1 cup soymilk fortified with calcium , vitamin B12, and vitamin D  Lunch 1 small whole wheat pita   cup cooked lentils  2 tablespoons hummus  4 carrot sticks  1 medium apple  1 cup soymilk fortified with calcium , vitamin B12, and vitamin D  Evening Meal Stir fry made with:  cup tofu  1 cup brown rice   cup broccoli   cup green beans   cup peppers   tablespoon peanut oil  1 cup cantaloupe  Evening Snack 1 cup soy yogurt   cup mixed nuts  Copyright 2020  Academy of Nutrition and Dietetics. All rights reserved  Sodium Free Flavoring Tips  When cooking, the following items may be used for flavoring instead of salt or seasonings that contain sodium. Remember: A little bit of spice goes a long way! Be careful not to overseason. Spice Blend Recipe (makes about ? cup) 5 teaspoons onion powder  2 teaspoons garlic powder  2 teaspoons paprika  2 teaspoon dry mustard  1 teaspoon crushed thyme leaves   teaspoon white pepper   teaspoon celery seed Food Item Flavorings  Beef Basil, bay leaf, caraway, curry, dill, dry mustard, garlic, grape jelly, green pepper, mace, marjoram, mushrooms (fresh), nutmeg, onion or onion powder, parsley, pepper, rosemary, sage  Chicken Basil, cloves, cranberries, mace, mushrooms (fresh), nutmeg, oregano, paprika, parsley, pineapple, saffron, sage, savory, tarragon, thyme, tomato, turmeric  Egg Chervil, curry, dill, dry mustard, garlic or garlic powder, green pepper, jelly, mushrooms (fresh), nutmeg, onion powder, paprika, parsley, rosemary, tarragon, tomato  Fish Basil, bay leaf, chervil, curry, dill, dry mustard,  green pepper, lemon juice, marjoram, mushrooms (fresh), paprika, pepper, tarragon, tomato, turmeric  Lamb Cloves, curry, dill, garlic or garlic powder, mace, mint, mint jelly, onion, oregano, parsley, pineapple, rosemary, tarragon, thyme  Pork Applesauce, basil, caraway, chives, cloves, garlic or garlic powder, onion or onion powder, rosemary, thyme  Veal Apricots, basil, bay leaf, currant jelly, curry, ginger, marjoram, mushrooms (fresh), oregano, paprika  Vegetables Basil, dill, garlic or garlic powder, ginger, lemon juice, mace, marjoram, nutmeg, onion or onion powder, tarragon, tomato, sugar or sugar substitute, salt-free salad dressing, vinegar  Desserts Allspice, anise, cinnamon, cloves, ginger, mace, nutmeg, vanilla extract, other extracts   Copyright 2020  [employer]. All rights reserved  Fluid Restricted Nutrition Therapy  You have been prescribed this diet because your condition affects how much fluid you can eat or drink. If your heart, liver, or  kidneys aren't working properly, you may not be able to effectively eliminate fluids from the body and this may cause swelling (edema) in the legs, arms, and/or stomach. Drink no more than _________ liters or ________ ounces or ________cups of fluid per day.  You don't need to stop eating or drinking the same fluids you normally would, but you may need to eat or drink less than usual.  Your registered dietitian nutritionist will help you determine the correct amount of fluid to consume during the day Breakfast Include fluids taken with medications  Lunch Include fluids taken with medications  Dinner Include fluids taken with medications  Bedtime Snack Include fluids taken with medications     Tips What Are Fluids?  A fluid is anything that is liquid or anything that would melt if left at room temperature. You will need to count these foods and liquids--including any liquid used to take medication--as part of your  daily fluid intake. Some examples are: Alcohol (drink only with your doctor's permission)  Coffee, tea, and other hot beverages  Gelatin (Jell-O)  Gravy  Ice cream, sherbet, sorbet  Ice cubes, ice chips  Milk, liquid creamer  Nutritional supplements  Popsicles  Vegetable and fruit juices; fluid in canned fruit  Watermelon  Yogurt  Soft drinks, lemonade, limeade  Soups  Syrup How Do I Measure My Fluid Intake? Record your fluid intake daily.  Tip: Every day, each time you eat or drink fluids, pour water in the same amount into an empty container that can hold the same amount of fluids you are allowed daily. This may help you keep track of how much fluid you are taking in throughout the day.  To accurately keep track of how much liquid you take in, measure the size of the cups, glasses, and bowls you use. If you eat soup, measure how much of it is liquid and how much is solid (such as noodles, vegetables, meat). Conversions for Measuring Fluid Intake  Milliliters (mL) Liters (L) Ounces (oz) Cups (c)  1000 1 32 4  1200 1.2 40 5  1500 1.5 50 6 1/4  1800 1.8 60 7 1/2  2000 2 67 8 1/3  Tips to Reduce Your Thirst Chew gum or suck on hard candy.  Rinse or gargle with mouthwash. Do not swallow.  Ice chips or popsicles my help quench thirst, but this too needs to be calculated into the total restriction. Melt ice chips or cubes first to figure out how much fluid they produce (for example, experiment with melting  cup ice chips or 2 ice cubes).  Add a lemon wedge to your water.  Limit how much salt you take in. A high salt intake might make you thirstier.  Don't eat or drink all your allowed liquids at once. Space your liquids out through the day.  Use small glasses and cups and sip slowly. If allowed, take your medications with fluids you eat or drink during a meal.   Fluid-Restricted Nutrition Therapy Sample 1-Day Menu  Breakfast 1 slice wheat toast  1 tablespoon peanut butter  1/2 cup  yogurt (120 milliliters)  1/2 cup blueberries  1 cup milk (240 milliliters)   Lunch 3 ounces sliced turkey  2 slices whole wheat bread  1/2 cup lettuce for sandwich  2 slices tomato for sandwich  1 ounce reduced-fat, reduced-sodium cheese  1/2 cup fresh carrot sticks  1 banana  1 cup unsweetened tea (240 milliliters)   Evening Meal 8 ounces soup (240  milliliters)  3 ounces salmon  1/2 cup quinoa  1 cup green beans  1 cup mixed greens salad  1 tablespoon olive oil  1 cup coffee (240 milliliters)  Evening Snack 1/2 cup sliced peaches  1/2 cup frozen yogurt (120 milliliters)  1 cup water (240 milliliters)  Copyright 2020  [employer]. All rights reserved   __________________________________________________________

## 2024-08-08 NOTE — TOC CM/SW Note (Signed)
 Transition of Care (TOC) CM/SW Note   CSW spoke with patient regarding SDOH resources. CSW provided resources on patients AVS. CSW to provide resources at bedside.   Andres Bantz, MSW, LCSWA Transitions of Care [phone]

## 2024-08-08 NOTE — Progress Notes (Signed)
 HD#1 SUBJECTIVE:  Patient Summary: Leroy Simmons is a 53 y.o. with a pertinent PMH of HFpEF, PE 2025, CAD, asthma, T2DM, and CKD4 who presented with SOB and admitted for acute on chronic heart failure exacerbation.   Overnight Events: none  Interim History: Slept well last night, breathing is a little better, no pain with breathing. Still coughing some but no production. Feels like his throat has stuff in it. No pain in his feet or legs. He's peeing and thinks its helping get his fluid off. No stomach pain, eating well. No chest pain.    OBJECTIVE:  Vital Signs: Vitals:   08/07/24 2125 08/07/24 2300 08/08/24 0356 08/08/24 0617  BP: (!) 153/92 (!) 160/90 (!) 150/98 (!) 157/99  Pulse:  81 77   Resp:  19 20   Temp:  98.7 F (37.1 C) 98.1 F (36.7 C)   TempSrc:  Oral Oral   SpO2:  95% 98%   Weight:   111.1 kg   Height:   5' 6 (1.676 m)    Supplemental O2: Room Air SpO2: 98 % O2 Flow Rate (L/min): 2 L/min  Filed Weights   08/06/24 1500 08/07/24 0550 08/08/24 0356  Weight: 110.5 kg 110.7 kg 111.1 kg     Intake/Output Summary (Last 24 hours) at 08/08/2024 0719 Last data filed at 08/08/2024 9352 Gross per 24 hour  Intake 889.78 ml  Output 2180 ml  Net -1290.22 ml   Net IO Since Admission: -2,929.85 mL [08/08/24 0719]  Physical Exam: Physical Exam Vitals reviewed.  Constitutional:      Appearance: He is not ill-appearing.  Eyes:     Comments: Glasses on today  Cardiovascular:     Rate and Rhythm: Normal rate and regular rhythm.     Pulses: Normal pulses.     Heart sounds: Normal heart sounds.  Pulmonary:     Effort: Pulmonary effort is normal.     Breath sounds: Decreased breath sounds present.  Abdominal:     General: Bowel sounds are normal.     Palpations: Abdomen is soft.  Musculoskeletal:     Right lower leg: Tenderness present. Edema present.     Left lower leg: Tenderness present. Edema present.     Comments: BL unna boots and socks on  Pitting edema  at least above the BL knees  Skin:    General: Skin is warm.     Coloration: Skin is not cyanotic.  Neurological:     General: No focal deficit present.     Mental Status: He is alert and oriented to person, place, and time.  Psychiatric:        Mood and Affect: Mood normal.        Behavior: Behavior normal.     Patient Lines/Drains/Airways Status     Active Line/Drains/Airways     Name Placement date Placement time Site Days   Peripheral IV 08/06/24 20 G Left Antecubital 08/06/24  0736  Antecubital  2            Pertinent labs and imaging:      Latest Ref Rng & Units 08/08/2024    5:59 AM 08/07/2024    3:22 AM 08/06/2024    1:46 AM  CBC  WBC 4.0 - 10.5 K/uL 8.7  8.5  8.9   Hemoglobin 13.0 - 17.0 g/dL 89.3  89.7  88.4   Hematocrit 39.0 - 52.0 % 32.2  30.2  34.9   Platelets 150 - 400 K/uL 266  261  316        Latest Ref Rng & Units 08/08/2024    5:59 AM 08/07/2024    8:07 PM 08/07/2024   11:15 AM  CMP  Glucose 70 - 99 mg/dL 871  806  862   BUN 6 - 20 mg/dL 50  50  44   Creatinine 0.61 - 1.24 mg/dL 6.11  6.04  6.54   Sodium 135 - 145 mmol/L 141  140  139   Potassium 3.5 - 5.1 mmol/L 3.8  4.1  3.7   Chloride 98 - 111 mmol/L 103  104  103   CO2 22 - 32 mmol/L 24  24  25    Calcium  8.9 - 10.3 mg/dL 8.4  8.3  8.2     VAS US  LOWER EXTREMITY VENOUS (DVT) Result Date: 08/07/2024  Lower Venous DVT Study Patient Name:  Leroy Simmons  Date of Exam:   08/07/2024 Medical Rec #: [id number]       Accession #:    [id number] Date of Birth: 09/28/1971       Patient Gender: M Patient Age:   21 years Exam Location:  [facility] Procedure:      VAS US  LOWER EXTREMITY VENOUS (DVT) Referring Phys: JEFFREY HATCHER --------------------------------------------------------------------------------  Indications: SOB, and Edema (L>R).  Risk Factors: PE 2025 DVT LLE peroneal 2024. Anticoagulation: Eliquis  (non compliant). Comparison Study: Previous exam on 01/09/2024 showed partial thrombus in  left                   peroneal vein. Performing Technologist: Ezzie Potters RVT, RDMS  Examination Guidelines: A complete evaluation includes B-mode imaging, spectral Doppler, color Doppler, and power Doppler as needed of all accessible portions of each vessel. Bilateral testing is considered an integral part of a complete examination. Limited examinations for reoccurring indications may be performed as noted. The reflux portion of the exam is performed with the patient in reverse Trendelenburg.  +---------+---------------+---------+-----------+----------+--------------+ RIGHT    CompressibilityPhasicitySpontaneityPropertiesThrombus Aging +---------+---------------+---------+-----------+----------+--------------+ CFV      Full           Yes      Yes                                 +---------+---------------+---------+-----------+----------+--------------+ SFJ      Full                                                        +---------+---------------+---------+-----------+----------+--------------+ FV Prox  Full           Yes      Yes                                 +---------+---------------+---------+-----------+----------+--------------+ FV Mid   Full           Yes      Yes                                 +---------+---------------+---------+-----------+----------+--------------+ FV DistalFull           Yes      Yes                                 +---------+---------------+---------+-----------+----------+--------------+  PFV      Full                                                        +---------+---------------+---------+-----------+----------+--------------+ POP      Full           Yes      Yes                                 +---------+---------------+---------+-----------+----------+--------------+ PTV      Full                                                        +---------+---------------+---------+-----------+----------+--------------+ PERO      Full                                                        +---------+---------------+---------+-----------+----------+--------------+   +---------+---------------+---------+-----------+----------+--------------+ LEFT     CompressibilityPhasicitySpontaneityPropertiesThrombus Aging +---------+---------------+---------+-----------+----------+--------------+ CFV      Full           Yes      Yes                                 +---------+---------------+---------+-----------+----------+--------------+ SFJ      Full                                                        +---------+---------------+---------+-----------+----------+--------------+ FV Prox  Full           Yes      Yes                                 +---------+---------------+---------+-----------+----------+--------------+ FV Mid   Full           Yes      Yes                                 +---------+---------------+---------+-----------+----------+--------------+ FV DistalFull           Yes      Yes                                 +---------+---------------+---------+-----------+----------+--------------+ PFV      Full                                                        +---------+---------------+---------+-----------+----------+--------------+  POP      Full           Yes      Yes                                 +---------+---------------+---------+-----------+----------+--------------+ PTV      Full                                                        +---------+---------------+---------+-----------+----------+--------------+ PERO     Full                                                        +---------+---------------+---------+-----------+----------+--------------+     Summary: BILATERAL: - No evidence of deep vein thrombosis seen in the lower extremities, bilaterally. -No evidence of popliteal cyst, bilaterally. -Subcutaneous edema, bilaterally.  *See table(s) above  for measurements and observations. Electronically signed by Lonni Gaskins MD on 08/07/2024 at 4:02:18 PM.    Final     ASSESSMENT/PLAN:  Assessment: Principal Problem:   Acute on chronic diastolic heart failure with preserved ejection fraction (HCC) Active Problems:   Uncontrolled type 2 diabetes mellitus with hyperglycemia, with long-term current use of insulin  (HCC)   Anemia of chronic disease   History of DVT (deep vein thrombosis), LLE 2024   CKD (chronic kidney disease) stage 4, GFR 15-29 ml/min (HCC)   History of pulmonary embolism, LLL 01/2024   Peripheral edema   Plan: Acute on chronic diastolic HF TTE LVEF 60-65% and LV w/o RMWA. Dry weight 106 Kg. Net I/O - . Has had good output, still volume overloaded on exam. Will continue diuresis today and add sacubitril-valsartan 24-26 mg BID for cardiorenal protection, monitor hyperkalemia  - BMP Mg AM - IV Lasix  120 mg Q8HR - carvedilol  12.5 mg BID, hydralazine  10 mg TID, sacubitril-valsartan 24-26 mg BID - Strict in/outs - Daily standing weights   CKD4 Nephrotic Range Proteinuria Nephro following, appreciate recommendations. Vit 25 15.3, PTH pending, consider supplementation. Starting sacubitril-valsartan 24-26 mg BID today - IV Lasix  120 mg Q8HR - renal diet with salt and fluid restriction - consider SGLT2i for nephro protection  - Unna boots - Goal MAP > 65   Hypertension 150/98 this AM. If still elevated after adding sacubitril-valsartan, room to increase carvedilol  if necessary  - Hydralazine  10 mg TID  Diabetes mellitus on chronic insulin  use A1c this admission 7.2. FBG this AM 135. 29 units aspart yesterday  - 25 units semglee  and 6 units TID with meals   CAD Hyperlipidemia Left heart cath 03/2022 that revealed 50% D2, 40% mid LAD, and 25% ramus lesions.  - atorvastatin  80 mg daily - Carvedilol  12.5 mg bid   Hypercoagulable, VTE Hx BL Vas US  c/w subQ edema and w/o evidence of DVT - Eliquis  5 mg BID    Normocytic anemia with iron deficiency Hgb 11.5 on admission. Gave IV Iron 11/5, will monitor  -CBC AM    OSA suspected Patient awaiting sleep studies. Did not wear CPAP last night.  - CPAP at night   Asthma - PRN albuterol    GERD - pantoprazole   40 mg daily  Best Practice: Diet: Renal diet IVF: Fluids: none, Rate: None VTE: SCDs Start: 08/06/24 1014 Code: Full  Disposition planning: Therapy Recs: None, DME: none DISPO: Anticipated discharge 1-2 days to Home pending med mgmt and clinical improvement.  Signature:  Viktoria Charmayne [facility]  7:19 AM, 08/08/2024  On Call pager [phone]

## 2024-08-08 NOTE — Progress Notes (Signed)
 Mobility Specialist Progress Note:    08/08/24 1149  Mobility  Activity Ambulated with assistance  Level of Assistance Contact guard assist, steadying assist  Assistive Device Front wheel walker  Distance Ambulated (ft) 100 ft  Activity Response Tolerated fair  Mobility Referral Yes  Mobility visit 1 Mobility  Mobility Specialist Start Time (ACUTE ONLY) 1149  Mobility Specialist Stop Time (ACUTE ONLY) 1154  Mobility Specialist Time Calculation (min) (ACUTE ONLY) 5 min   Received pt laying in bed agreeable to session. Pt c/o of being a little lightheaded and dizzy but otherwise no other symptoms. Pt moving and ambulating well. One bout of small LOB but self corrected. Returned pt to [facility] for lunch and all needs met.   Venetia Keel Mobility Specialist Please Neurosurgeon or Rehab Office at [phone]

## 2024-08-09 DIAGNOSIS — N184 Chronic kidney disease, stage 4 (severe): Secondary | ICD-10-CM | POA: Diagnosis not present

## 2024-08-09 DIAGNOSIS — I10 Essential (primary) hypertension: Secondary | ICD-10-CM

## 2024-08-09 DIAGNOSIS — Z86711 Personal history of pulmonary embolism: Secondary | ICD-10-CM | POA: Diagnosis not present

## 2024-08-09 DIAGNOSIS — I5033 Acute on chronic diastolic (congestive) heart failure: Secondary | ICD-10-CM | POA: Diagnosis not present

## 2024-08-09 DIAGNOSIS — I2510 Atherosclerotic heart disease of native coronary artery without angina pectoris: Secondary | ICD-10-CM | POA: Diagnosis not present

## 2024-08-09 DIAGNOSIS — E1122 Type 2 diabetes mellitus with diabetic chronic kidney disease: Secondary | ICD-10-CM | POA: Diagnosis not present

## 2024-08-09 DIAGNOSIS — I130 Hypertensive heart and chronic kidney disease with heart failure and stage 1 through stage 4 chronic kidney disease, or unspecified chronic kidney disease: Secondary | ICD-10-CM | POA: Diagnosis not present

## 2024-08-09 DIAGNOSIS — Z86718 Personal history of other venous thrombosis and embolism: Secondary | ICD-10-CM | POA: Diagnosis not present

## 2024-08-09 DIAGNOSIS — E1165 Type 2 diabetes mellitus with hyperglycemia: Secondary | ICD-10-CM | POA: Diagnosis not present

## 2024-08-09 DIAGNOSIS — D631 Anemia in chronic kidney disease: Secondary | ICD-10-CM | POA: Diagnosis not present

## 2024-08-09 DIAGNOSIS — E785 Hyperlipidemia, unspecified: Secondary | ICD-10-CM | POA: Diagnosis not present

## 2024-08-09 LAB — BASIC METABOLIC PANEL WITH GFR
Anion gap: 12 (ref 5–15)
BUN: 60 mg/dL — ABNORMAL HIGH (ref 6–20)
CO2: 22 mmol/L (ref 22–32)
Calcium: 8.2 mg/dL — ABNORMAL LOW (ref 8.9–10.3)
Chloride: 103 mmol/L (ref 98–111)
Creatinine, Ser: 4.78 mg/dL — ABNORMAL HIGH (ref 0.61–1.24)
GFR, Estimated: 14 mL/min — ABNORMAL LOW (ref >60)
Glucose, Bld: 164 mg/dL — ABNORMAL HIGH (ref 70–99)
Potassium: 3.9 mmol/L (ref 3.5–5.1)
Sodium: 137 mmol/L (ref 135–145)

## 2024-08-09 LAB — CULTURE, RESPIRATORY W GRAM STAIN: Culture: NORMAL

## 2024-08-09 LAB — GLUCOSE, CAPILLARY
Glucose-Capillary: 183 mg/dL — ABNORMAL HIGH (ref 70–99)
Glucose-Capillary: 212 mg/dL — ABNORMAL HIGH (ref 70–99)
Glucose-Capillary: 138 mg/dL — ABNORMAL HIGH (ref 70–99)
Glucose-Capillary: 233 mg/dL — ABNORMAL HIGH (ref 70–99)

## 2024-08-09 LAB — MAGNESIUM: Magnesium: 2 mg/dL (ref 1.7–2.4)

## 2024-08-09 LAB — RENAL FUNCTION PANEL
Albumin: 2.6 g/dL — ABNORMAL LOW (ref 3.5–5.0)
Anion gap: 14 (ref 5–15)
BUN: 57 mg/dL — ABNORMAL HIGH (ref 6–20)
CO2: 21 mmol/L — ABNORMAL LOW (ref 22–32)
Calcium: 8.3 mg/dL — ABNORMAL LOW (ref 8.9–10.3)
Chloride: 101 mmol/L (ref 98–111)
Creatinine, Ser: 4.41 mg/dL — ABNORMAL HIGH (ref 0.61–1.24)
GFR, Estimated: 15 mL/min — ABNORMAL LOW (ref >60)
Glucose, Bld: 235 mg/dL — ABNORMAL HIGH (ref 70–99)
Phosphorus: 4.6 mg/dL (ref 2.5–4.6)
Potassium: 3.5 mmol/L (ref 3.5–5.1)
Sodium: 136 mmol/L (ref 135–145)

## 2024-08-09 LAB — CBC
HCT: 32.8 % — ABNORMAL LOW (ref 39.0–52.0)
Hemoglobin: 11.3 g/dL — ABNORMAL LOW (ref 13.0–17.0)
MCH: 31.3 pg (ref 26.0–34.0)
MCHC: 34.5 g/dL (ref 30.0–36.0)
MCV: 90.9 fL (ref 80.0–100.0)
Platelets: 335 K/uL (ref 150–400)
RBC: 3.61 MIL/uL — ABNORMAL LOW (ref 4.22–5.81)
RDW: 11.5 % (ref 11.5–15.5)
WBC: 8.4 K/uL (ref 4.0–10.5)
nRBC: 0 % (ref 0.0–0.2)

## 2024-08-09 MED ORDER — GUAIFENESIN ER 600 MG PO TB12
600.0000 mg | ORAL_TABLET | Freq: Two times a day (BID) | ORAL | Status: DC
  Administered 2024-08-09 – 2024-08-16 (×13): 600 mg via ORAL
  Filled 2024-08-09 (×14): qty 1

## 2024-08-09 MED ORDER — CARVEDILOL 25 MG PO TABS
25.0000 mg | ORAL_TABLET | Freq: Two times a day (BID) | ORAL | Status: DC
  Administered 2024-08-09 – 2024-08-16 (×14): 25 mg via ORAL
  Filled 2024-08-09 (×14): qty 1

## 2024-08-09 MED ORDER — POTASSIUM CHLORIDE 20 MEQ PO PACK
20.0000 meq | PACK | ORAL | Status: AC
  Administered 2024-08-09 (×2): 20 meq via ORAL
  Filled 2024-08-09 (×2): qty 1

## 2024-08-09 NOTE — Progress Notes (Signed)
 Patient ID: Leroy Simmons, male   DOB: 07/19/1971, 53 y.o.   MRN: [id number] S:  2L UOP yesterday neg 4L total; weights no much change Lasix  120 IV q8h, Started entresto 08/08/24 SCr up to 4.4, K 3.5 BPs ok on RA  Tells me has not been seen at [facility] in some time and lives in [place].  He wishes to be seen in the [place] area for nephrology care.  He  O:BP (!) 145/81 (BP Location: Right Arm)   Pulse 81   Temp 98 F (36.7 C) (Oral)   Resp 20   Ht 5' 6 (1.676 m)   Wt 110.7 kg   SpO2 95%   BMI 39.39 kg/m   Intake/Output Summary (Last 24 hours) at 08/09/2024 0857 Last data filed at 08/09/2024 0852 Gross per 24 hour  Intake 604 ml  Output 1950 ml  Net -1346 ml   Intake/Output: I/O last 3 completed shifts: In: 1312.6 [P.O.:840; IV Piggyback:472.6] Out: 3530 [Urine:3530]  Intake/Output this shift:  Total I/O In: 120 [P.O.:120] Out: -  Weight change: -0.4 kg Gen: NAD CVS: RRR Resp: bilateral crackles Abd: +BS, soft, NT/ND Ext: unna boots in place  Recent Labs  Lab 08/06/24 0146 08/06/24 0739 08/06/24 1546 08/07/24 0322 08/07/24 1115 08/07/24 2007 08/08/24 0559 08/09/24 0308  NA 138  --  141 142 139 140 141 136  K 4.0  --  3.7 3.7 3.7 4.1 3.8 3.5  CL 104  --  105 106 103 104 103 101  CO2 23  --  24 24 25 24 24  21*  GLUCOSE 180*  --  169* 126* 137* 193* 128* 235*  BUN 48*  --  46* 48* 44* 50* 50* 57*  CREATININE 3.39*  --  3.47* 3.53* 3.45* 3.95* 3.88* 4.41*  ALBUMIN   --  2.6* 2.6*  --   --  2.4*  --  2.6*  CALCIUM  8.7*  --  8.3* 8.3* 8.2* 8.3* 8.4* 8.3*  PHOS  --   --  3.6  --   --  3.8  --  4.6  AST  --  22  --   --   --   --   --   --   ALT  --  26  --   --   --   --   --   --    Liver Function Tests: Recent Labs  Lab 08/06/24 0739 08/06/24 1546 08/07/24 2007 08/09/24 0308  AST 22  --   --   --   ALT 26  --   --   --   ALKPHOS 133*  --   --   --   BILITOT 0.8  --   --   --   PROT 6.5  --   --   --   ALBUMIN  2.6* 2.6* 2.4* 2.6*   No results for  input(s): LIPASE, AMYLASE in the last 168 hours. No results for input(s): AMMONIA in the last 168 hours. CBC: Recent Labs  Lab 08/06/24 0146 08/07/24 0322 08/08/24 0559 08/09/24 0308  WBC 8.9 8.5 8.7 8.4  HGB 11.5* 10.2* 10.6* 11.3*  HCT 34.9* 30.2* 32.2* 32.8*  MCV 93.6 91.8 92.0 90.9  PLT 316 261 266 335   Cardiac Enzymes: No results for input(s): CKTOTAL, CKMB, CKMBINDEX, TROPONINI in the last 168 hours. CBG: Recent Labs  Lab 08/08/24 0630 08/08/24 1046 08/08/24 1543 08/08/24 2045 08/09/24 0627  GLUCAP 135* 201* 119* 154* 183*    Iron Studies:  Recent Labs    08/07/24 0322  IRON 28*  TIBC 242*  FERRITIN 116   Studies/Results: VAS US  LOWER EXTREMITY VENOUS (DVT) Result Date: 08/07/2024  Lower Venous DVT Study Patient Name:  ARJUN HARD  Date of Exam:   08/07/2024 Medical Rec #: [id number]       Accession #:    [id number] Date of Birth: 25-Oct-1970       Patient Gender: M Patient Age:   65 years Exam Location:  [facility] Procedure:      VAS US  LOWER EXTREMITY VENOUS (DVT) Referring Phys: JEFFREY HATCHER --------------------------------------------------------------------------------  Indications: SOB, and Edema (L>R).  Risk Factors: PE 2025 DVT LLE peroneal 2024. Anticoagulation: Eliquis  (non compliant). Comparison Study: Previous exam on 01/09/2024 showed partial thrombus in left                   peroneal vein. Performing Technologist: Ezzie Potters RVT, RDMS  Examination Guidelines: A complete evaluation includes B-mode imaging, spectral Doppler, color Doppler, and power Doppler as needed of all accessible portions of each vessel. Bilateral testing is considered an integral part of a complete examination. Limited examinations for reoccurring indications may be performed as noted. The reflux portion of the exam is performed with the patient in reverse Trendelenburg.  +---------+---------------+---------+-----------+----------+--------------+ RIGHT     CompressibilityPhasicitySpontaneityPropertiesThrombus Aging +---------+---------------+---------+-----------+----------+--------------+ CFV      Full           Yes      Yes                                 +---------+---------------+---------+-----------+----------+--------------+ SFJ      Full                                                        +---------+---------------+---------+-----------+----------+--------------+ FV Prox  Full           Yes      Yes                                 +---------+---------------+---------+-----------+----------+--------------+ FV Mid   Full           Yes      Yes                                 +---------+---------------+---------+-----------+----------+--------------+ FV DistalFull           Yes      Yes                                 +---------+---------------+---------+-----------+----------+--------------+ PFV      Full                                                        +---------+---------------+---------+-----------+----------+--------------+ POP      Full           Yes      Yes                                 +---------+---------------+---------+-----------+----------+--------------+  PTV      Full                                                        +---------+---------------+---------+-----------+----------+--------------+ PERO     Full                                                        +---------+---------------+---------+-----------+----------+--------------+   +---------+---------------+---------+-----------+----------+--------------+ LEFT     CompressibilityPhasicitySpontaneityPropertiesThrombus Aging +---------+---------------+---------+-----------+----------+--------------+ CFV      Full           Yes      Yes                                 +---------+---------------+---------+-----------+----------+--------------+ SFJ      Full                                                         +---------+---------------+---------+-----------+----------+--------------+ FV Prox  Full           Yes      Yes                                 +---------+---------------+---------+-----------+----------+--------------+ FV Mid   Full           Yes      Yes                                 +---------+---------------+---------+-----------+----------+--------------+ FV DistalFull           Yes      Yes                                 +---------+---------------+---------+-----------+----------+--------------+ PFV      Full                                                        +---------+---------------+---------+-----------+----------+--------------+ POP      Full           Yes      Yes                                 +---------+---------------+---------+-----------+----------+--------------+ PTV      Full                                                        +---------+---------------+---------+-----------+----------+--------------+  PERO     Full                                                        +---------+---------------+---------+-----------+----------+--------------+     Summary: BILATERAL: - No evidence of deep vein thrombosis seen in the lower extremities, bilaterally. -No evidence of popliteal cyst, bilaterally. -Subcutaneous edema, bilaterally.  *See table(s) above for measurements and observations. Electronically signed by Lonni Gaskins MD on 08/07/2024 at 4:02:18 PM.    Final     apixaban   5 mg Oral BID   atorvastatin   80 mg Oral QHS   carvedilol   12.5 mg Oral BID WC   dorzolamide -timolol   1 drop Both Eyes BID   hydrALAZINE   10 mg Oral Q8H   influenza vac split trivalent PF  0.5 mL Intramuscular Tomorrow-1000   insulin  aspart  0-20 Units Subcutaneous TID WC   insulin  aspart  0-5 Units Subcutaneous QHS   insulin  aspart  6 Units Subcutaneous TID WC   insulin  glargine-yfgn  25 Units Subcutaneous QHS   latanoprost   1 drop Both  Eyes QHS   pantoprazole   40 mg Oral Daily   potassium chloride   20 mEq Oral Q4H   sacubitril-valsartan  1 tablet Oral BID   senna  1 tablet Oral BID    BMET    Component Value Date/Time   NA 136 08/09/2024 0308   NA 142 07/08/2024 1427   K 3.5 08/09/2024 0308   CL 101 08/09/2024 0308   CO2 21 (L) 08/09/2024 0308   GLUCOSE 235 (H) 08/09/2024 0308   BUN 57 (H) 08/09/2024 0308   BUN 40 (H) 07/08/2024 1427   CREATININE 4.41 (H) 08/09/2024 0308   CALCIUM  8.3 (L) 08/09/2024 0308   GFRNONAA 15 (L) 08/09/2024 0308   GFRAA  10/26/2010 1143    >60        The eGFR has been calculated using the MDRD equation. This calculation has not been validated in all clinical situations. eGFR's persistently <60 mL/min signify possible Chronic Kidney Disease.   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   Has historically been followed by [facility], no visits since January 2024 and patient asks to be seen and [place] as he lives locally..   High risk of progression to dialysis dependence, not indicated currently GFR worsened over past 24 hours likely from Entresto initiation and ongoing IV diuretics If creatinine worsens again tomorrow would stop Entresto, marginal for use here with very low GFR Continue current dosing of Lasix , monitor for transition to oral diuretics; weights not changing Consult dietitian for low-sodium diet    HFpEF LVEF 60% -  responding nicely to diuresis.  Not sure he will tolerate Entresto. Hypertension: Trend with diuresis Renal osteodystrophy -low vitamin D normal calcium  and phosphorus,, PTH. Anemia -TSAT 12% F116 -> already given Venofer 500mg . DM - On Semglee  and Novolog  H/o PE on Eliquis   With weekend approaching we will go ahead and begin process of close follow-up in our office.  Bernardino KATHEE Gasman, MD  [facility]

## 2024-08-09 NOTE — Progress Notes (Signed)
 Mobility Specialist Progress Note:    08/09/24 1029  Mobility  Activity Ambulated with assistance  Level of Assistance Standby assist, set-up cues, supervision of patient - no hands on  Assistive Device Front wheel walker  Distance Ambulated (ft) 100 ft  Activity Response Tolerated fair  Mobility Referral Yes  Mobility visit 1 Mobility  Mobility Specialist Start Time (ACUTE ONLY) 1029  Mobility Specialist Stop Time (ACUTE ONLY) 1037  Mobility Specialist Time Calculation (min) (ACUTE ONLY) 8 min   Received pt in bed agreeable to session. Pt c/o of chest feeling like its full of fluid and SOB towards end of section but VSS and everything alright. Pt moving and ambulating well. Pt cued to stand within walker and correct posture. Stated it felt better and easier to use. Returned pt to chair w/ all needs met. RN notified.   Venetia Keel Mobility Specialist Please Neurosurgeon or Rehab Office at [phone]

## 2024-08-09 NOTE — Plan of Care (Signed)
   Problem: Education: Goal: Ability to describe self-care measures that may prevent or decrease complications (Diabetes Survival Skills Education) will improve Outcome: Progressing Goal: Individualized Educational Video(s) Outcome: Progressing   Problem: Coping: Goal: Ability to adjust to condition or change in health will improve Outcome: Progressing

## 2024-08-09 NOTE — Progress Notes (Signed)
 HD#2 SUBJECTIVE:  Patient Summary:  Leroy Simmons is a 53 y.o. with a pertinent PMH of HFpEF, PE 2025, CAD, asthma, T2DM, and CKD4 who presented with SOB and admitted for acute on chronic heart failure exacerbation.    Overnight Events: none   Interim History: He's feeling okay today, kinda stuffy but still not producing a lot of mucus. Feels like his nose is stuffy. His breathing is better but he still feels SOB when he gets up and moving around. Legs feel about the same. Encouraged him to elevate his legs and keep walking around. Discussed plan for cards to come see him.   OBJECTIVE:  Vital Signs: Vitals:   08/08/24 1703 08/08/24 2031 08/08/24 2342 08/09/24 0300  BP: 137/82 (!) 147/87 (!) 143/89 (!) 144/85  Pulse: 80 80 79 79  Resp:  18 20 20   Temp:  98.2 F (36.8 C) 98.7 F (37.1 C) 98.1 F (36.7 C)  TempSrc:  Oral Oral Oral  SpO2:  97% 97% 96%  Weight:    110.7 kg  Height:    5' 6 (1.676 m)   Supplemental O2: Room Air SpO2: 96 % O2 Flow Rate (L/min): 2 L/min  Filed Weights   08/07/24 0550 08/08/24 0356 08/09/24 0300  Weight: 110.7 kg 111.1 kg 110.7 kg     Intake/Output Summary (Last 24 hours) at 08/09/2024 9371 Last data filed at 08/09/2024 0600 Gross per 24 hour  Intake 844 ml  Output 2250 ml  Net -1406 ml   Net IO Since Admission: -4,035.85 mL [08/09/24 0628]  Physical Exam: Physical Exam Vitals reviewed.  HENT:     Head:     Comments: Nasally voice today, sounds more congested Eyes:     Extraocular Movements: Extraocular movements intact.  Cardiovascular:     Rate and Rhythm: Normal rate and regular rhythm.  Pulmonary:     Effort: Pulmonary effort is normal.     Breath sounds: No decreased breath sounds.  Abdominal:     Palpations: Abdomen is soft.  Musculoskeletal:     Cervical back: Normal range of motion.     Right lower leg: Edema present.     Left lower leg: Edema present.     Comments: BL pitting edema at least up to over the knees  R>L Unna boots and socks on  Still appreciate dome shaped swelling of dorsal BL feet with pitting edema   Skin:    General: Skin is warm.  Neurological:     General: No focal deficit present.     Mental Status: He is alert and oriented to person, place, and time.  Psychiatric:        Mood and Affect: Mood normal.        Behavior: Behavior normal.     Patient Lines/Drains/Airways Status     Active Line/Drains/Airways     Name Placement date Placement time Site Days   Peripheral IV 08/06/24 20 G Left Antecubital 08/06/24  0736  Antecubital  3            Pertinent labs and imaging:      Latest Ref Rng & Units 08/09/2024    3:08 AM 08/08/2024    5:59 AM 08/07/2024    3:22 AM  CBC  WBC 4.0 - 10.5 K/uL 8.4  8.7  8.5   Hemoglobin 13.0 - 17.0 g/dL 88.6  89.3  89.7   Hematocrit 39.0 - 52.0 % 32.8  32.2  30.2   Platelets 150 - 400  K/uL 335  266  261        Latest Ref Rng & Units 08/09/2024    3:08 AM 08/08/2024    5:59 AM 08/07/2024    8:07 PM  CMP  Glucose 70 - 99 mg/dL 764  871  806   BUN 6 - 20 mg/dL 57  50  50   Creatinine 0.61 - 1.24 mg/dL 5.58  6.11  6.04   Sodium 135 - 145 mmol/L 136  141  140   Potassium 3.5 - 5.1 mmol/L 3.5  3.8  4.1   Chloride 98 - 111 mmol/L 101  103  104   CO2 22 - 32 mmol/L 21  24  24    Calcium  8.9 - 10.3 mg/dL 8.3  8.4  8.3     No results found.  ASSESSMENT/PLAN:  Assessment: Principal Problem:   Acute on chronic diastolic heart failure with preserved ejection fraction (HCC) Active Problems:   Uncontrolled type 2 diabetes mellitus with hyperglycemia, with long-term current use of insulin  (HCC)   Anemia of chronic disease   History of DVT (deep vein thrombosis), LLE 2024   CKD (chronic kidney disease) stage 4, GFR 15-29 ml/min (HCC)   History of pulmonary embolism, LLL 01/2024   Peripheral edema   Plan: Acute on chronic diastolic HF Dry weight 106 Kg, today 110.7kg. Net I/O - . Cr 3.88 > 4.41 overnight likely 2/2 starting  entresto and continued aggressive diuresis. Has had good output, still volume overloaded on exam. Will continue IV diuresis with plan to switch to PO when he appears less volume overloaded on exam -consulted cards, appreciate recs and time, will d/c entresto - BMP Mg this evening and AM - IV Lasix  120 mg Q8HR - increase carvedilol  12.5 to 25 mg BID, hydralazine  10 mg TID - Strict in/outs - Daily standing weights   CKD4 Nephrotic Range Proteinuria Nephro following, appreciate recommendations. Vit 25 15.3, PTH pending, consider supplementation. Consulted nutrition for renal/low sodium diet  - IV Lasix  120 mg Q8HR - renal diet with salt and fluid restriction - consider ACEi/ARB for nephro protection  - Unna boots, encourage elevation  - Goal MAP > 65   Hypertension 144/85 this AM. Increased carvedilol  11/7 - Hydralazine  10 mg TID, carvedilol  25 mg BID   Diabetes mellitus on chronic insulin  use A1c this admission 7.2. FBG this AM 183. Required 32 units aspart yesterday - 25 units semglee  and 6 units TID with meals   CAD Hyperlipidemia Left heart cath 03/2022 that revealed 50% D2, 40% mid LAD, and 25% ramus lesions.  - atorvastatin  80 mg daily - Carvedilol  12.5 mg bid   Hypercoagulable, VTE Hx BL Vas US  c/w subQ edema and w/o evidence of DVT - Eliquis  5 mg BID   Normocytic anemia with iron deficiency Hgb 11.5 on admission. Gave IV Iron 11/5, 11.3 today -CBC AM    OSA suspected Patient awaiting sleep studies. Did not wear CPAP last night.  - CPAP at night   Asthma - PRN albuterol    GERD - pantoprazole  40 mg daily  Best Practice: Diet: Renal diet IVF: Fluids: none, Rate: None VTE: SCDs Start: 08/06/24 1014 Code: Full  Disposition planning: Therapy Recs: None, DME: none DISPO: Anticipated discharge 2-3 days to Home pending clinical improvement and med mgmt.  Signature:  Viktoria Charmayne [facility]  6:28 AM, 08/09/2024  On Call pager  [phone]

## 2024-08-09 NOTE — Plan of Care (Signed)
   Problem: Fluid Volume: Goal: Ability to maintain a balanced intake and output will improve Outcome: Progressing

## 2024-08-09 NOTE — Consult Note (Addendum)
 Cardiology Consultation   Patient ID: Leroy Simmons MRN: [id number]; DOB: 06-Feb-1971  Admit date: 08/06/2024 Date of Consult: 08/09/2024  PCP:  Celestia Rosaline SQUIBB, NP   [facility] Providers Cardiologist:  Soyla DELENA Merck, MD     Patient Profile: Leroy Simmons is a 53 y.o. male with a h/o HFpEF, Nonobstructive CAD by LHC in 2023, history of DVT/PE on anticoagulation, Type 2DM (Hgb A1c 7.2), HTN, HLD, CKD, obesity, anxiety, and asthma. who is being seen 08/09/2024 for the evaluation of heart failure exacerbation at the request of Reyes Fenton, MD.  History of Present Illness: Mr. Bosher was last seen by cardiology 02/2023 for hospital follow-up after an admission for acute on chronic HFpEF with massive volume overload. He had been quite hypertensive with demand ischemia troponins elevated to the 300s. He was diuresed with IV Lasix , GDMT was limited by his renal dysfunction.  During that admission he also had an acute DVT started on Eliquis .  Dry weight on discharge was 218 pounds.  At the follow-up appointment, patient was noted to be volume overloaded with 14 pound weight increase, he was advised to improve medication compliance, increase his diuresis, proceed with RHC and a sleep study.  Patient did not show up for RHC or follow-up appointment.  On chart review most recent echocardiogram prior to this admission showed LVEF 60-65% with no RWMA.  Mild LVH.  Normal RV function. Mildly dilated LA. Trivial MR. Was obtained in the setting of an admission for acute respiratory failure in the setting of acute PE, pneumonia, viral gastroenteritis, and AKI on CKD.  Patient did recently establish with pulmonary 06/2024 for excessive daytime sleepiness.  He was advised to undergo home sleep test with concern for OSA. Of note patient is recently homeless, he was evicted on 11/4.  Patient presented to ED on 11/4 for progressive shortness of breath and peripheral edema for the last 5  days. BP 169/98    HR 92   ECG sinus rhythm with late R wave progression nonspecific ST changes baseline artifact VR 98  CXR showed mild cardiomegaly with mild vascular congestion NM Pulmonary perfusion unremarkable DVT US  negative Echocardiogram showed LVEF 60 to 65% with no RWMA.  Normal RV function.  Dilated LA.  Valvular disease.  Pertinent lab results Cr 3.39  -> 4.41     hgb ~11 Alk phos 133    Albumin  2.6  BNP 241      Troponin 72 -> 56 UA showed substantial glucose and protein  Has received two doses of IV lasix , now transitioned to 120 mg q8h. Net IO Since Admission: -3,915.85 mL [08/09/24 1205]  On interview, patient shared he has shortness of breath with cough. Reports orthopnea and PND though little DOE. Only notices palpations when severely short of breath. Denied chest pain. Reported medication compliance prior to admission.   Past Medical History:  Diagnosis Date   Acute gangrenous appendicitis 09/25/2022   Anxiety    CHF (congestive heart failure) (HCC)    Coronary artery disease    Depression    Diabetes mellitus without complication (HCC)    Hypertension     Past Surgical History:  Procedure Laterality Date   INCISION AND DRAINAGE ABSCESS N/A 09/15/2023   Procedure: INCISION AND DRAINAGE OF BACK ABSCESS;  Surgeon: Belinda Cough, MD;  Location: [facility] OR;  Service: General;  Laterality: N/A;   LAPAROSCOPIC APPENDECTOMY N/A 09/26/2022   Procedure: APPENDECTOMY LAPAROSCOPIC;  Surgeon: Lane Shope, MD;  Location: [facility] ORS;  Service: General;  Laterality: N/A;   LEFT HEART CATH AND CORONARY ANGIOGRAPHY N/A 03/21/2022   Procedure: LEFT HEART CATH AND CORONARY ANGIOGRAPHY;  Surgeon: Dann Candyce RAMAN, MD;  Location: [facility] INVASIVE CV LAB;  Service: Cardiovascular;  Laterality: N/A;       Scheduled Meds:  apixaban   5 mg Oral BID   atorvastatin   80 mg Oral QHS   carvedilol   12.5 mg Oral BID WC   dorzolamide -timolol   1 drop Both Eyes BID   hydrALAZINE   10 mg  Oral Q8H   influenza vac split trivalent PF  0.5 mL Intramuscular Tomorrow-1000   insulin  aspart  0-20 Units Subcutaneous TID WC   insulin  aspart  0-5 Units Subcutaneous QHS   insulin  aspart  6 Units Subcutaneous TID WC   insulin  glargine-yfgn  25 Units Subcutaneous QHS   latanoprost   1 drop Both Eyes QHS   pantoprazole   40 mg Oral Daily   potassium chloride   20 mEq Oral Q4H   sacubitril-valsartan  1 tablet Oral BID   senna  1 tablet Oral BID   Continuous Infusions:  furosemide  120 mg (08/09/24 0637)   PRN Meds: acetaminophen  **OR** acetaminophen , albuterol   Allergies:   No Known Allergies  Social History:   Social History   Socioeconomic History   Marital status: Single    Spouse name: Not on file   Number of children: 0   Years of education: Not on file   Highest education level: High school graduate  Occupational History   Occupation: retail  Tobacco Use   Smoking status: Never   Smokeless tobacco: Never  Vaping Use   Vaping status: Never Used  Substance and Sexual Activity   Alcohol use: Yes    Comment: rarely   Drug use: Never   Sexual activity: Not on file  Other Topics Concern   Not on file  Social History Narrative   Not on file   Social Drivers of Health   Financial Resource Strain: Low Risk  (01/20/2023)   Overall Financial Resource Strain (CARDIA)    Difficulty of Paying Living Expenses: Not very hard  Food Insecurity: Food Insecurity Present (08/06/2024)   Hunger Vital Sign    Worried About Running Out of Food in the Last Year: Sometimes true    Ran Out of Food in the Last Year: Sometimes true  Transportation Needs: No Transportation Needs (08/06/2024)   PRAPARE - Administrator, Civil Service (Medical): No    Lack of Transportation (Non-Medical): No  Physical Activity: Not on file  Stress: Not on file  Social Connections: Socially Isolated (08/06/2024)   Social Connection and Isolation Panel    Frequency of Communication with Friends  and Family: More than three times a week    Frequency of Social Gatherings with Friends and Family: More than three times a week    Attends Religious Services: Never    Database Administrator or Organizations: No    Attends Banker Meetings: Never    Marital Status: Never married  Intimate Partner Violence: Not At Risk (08/06/2024)   Humiliation, Afraid, Rape, and Kick questionnaire    Fear of Current or Ex-Partner: No    Emotionally Abused: No    Physically Abused: No    Sexually Abused: No    Family History:   Family History  Problem Relation Age of Onset   Heart failure Mother      ROS:  Please see the history of present illness.  All  other ROS reviewed and negative.     Physical Exam/Data: Vitals:   08/09/24 0300 08/09/24 0634 08/09/24 0850 08/09/24 1117  BP: (!) 144/85 (!) 143/89 (!) 145/81 (!) 142/91  Pulse: 79  81 79  Resp: 20  20 18   Temp: 98.1 F (36.7 C)  98 F (36.7 C) 97.9 F (36.6 C)  TempSrc: Oral  Oral Oral  SpO2: 96%  95% 97%  Weight: 110.7 kg     Height: 5' 6 (1.676 m)       Intake/Output Summary (Last 24 hours) at 08/09/2024 1135 Last data filed at 08/09/2024 0852 Gross per 24 hour  Intake 484 ml  Output 1475 ml  Net -991 ml      08/09/2024    3:00 AM 08/08/2024    3:56 AM 08/07/2024    5:50 AM  Last 3 Weights  Weight (lbs) 244 lb 0.8 oz 244 lb 14.9 oz 244 lb 0.8 oz  Weight (kg) 110.7 kg 111.1 kg 110.7 kg     Body mass index is 39.39 kg/m.  General:  Chronically ill male, in no acute distress though coughing often HEENT: normal Neck: no JVD though hard to assess Vascular: Distal pulses 2+ bilaterally Cardiac:  normal S1, S2; RRR; no murmur  Lungs:  Breath sounds diminished bilaterally, though patient unable to deeply inspire with coughing Abd: soft, nontender, no hepatomegaly  Ext: 1+ pitting edema to BLE with unna boots in place Musculoskeletal:  No deformities, BUE and BLE strength normal and equal Skin: warm and dry   Neuro:  CNs 2-12 intact, no focal abnormalities noted Psych:  Normal affect   EKG:  The EKG was personally reviewed and demonstrates:  see hpi Telemetry:  Telemetry was personally reviewed and demonstrates:  sinus rhythm HR 80   Relevant CV Studies: Echocardiogram 01/2024 IMPRESSIONS     1. Left ventricular ejection fraction, by estimation, is 60 to 65%. The  left ventricle has normal function. The left ventricle has no regional  wall motion abnormalities. There is mild left ventricular hypertrophy.  Left ventricular diastolic parameters  are indeterminate.   2. Right ventricular systolic function is normal. The right ventricular  size is normal. Tricuspid regurgitation signal is inadequate for assessing  PA pressure.   3. Left atrial size was mildly dilated.   4. The mitral valve is grossly normal. Trivial mitral valve  regurgitation. No evidence of mitral stenosis.   5. The aortic valve is grossly normal. There is mild calcification of the  aortic valve. Aortic valve regurgitation is not visualized. No aortic  stenosis is present.   6. The inferior vena cava is normal in size with greater than 50%  respiratory variability, suggesting right atrial pressure of 3 mmHg.   7. Agitated saline contrast bubble study was negative, with no evidence  of any interatrial shunt.   Laboratory Data: High Sensitivity Troponin:   Recent Labs  Lab 08/06/24 0739 08/06/24 1017  TROPONINIHS 72* 56*     Chemistry Recent Labs  Lab 08/07/24 0322 08/07/24 1115 08/07/24 2007 08/08/24 0559 08/09/24 0308  NA 142   < > 140 141 136  K 3.7   < > 4.1 3.8 3.5  CL 106   < > 104 103 101  CO2 24   < > 24 24 21*  GLUCOSE 126*   < > 193* 128* 235*  BUN 48*   < > 50* 50* 57*  CREATININE 3.53*   < > 3.95* 3.88* 4.41*  CALCIUM  8.3*   < >  8.3* 8.4* 8.3*  MG 2.1  --   --  2.1 2.0  GFRNONAA 20*   < > 18* 18* 15*  ANIONGAP 12   < > 12 14 14    < > = values in this interval not displayed.    Recent  Labs  Lab 08/06/24 0739 08/06/24 1546 08/07/24 2007 08/09/24 0308  PROT 6.5  --   --   --   ALBUMIN  2.6* 2.6* 2.4* 2.6*  AST 22  --   --   --   ALT 26  --   --   --   ALKPHOS 133*  --   --   --   BILITOT 0.8  --   --   --    Hematology Recent Labs  Lab 08/07/24 0322 08/08/24 0559 08/09/24 0308  WBC 8.5 8.7 8.4  RBC 3.29* 3.50* 3.61*  HGB 10.2* 10.6* 11.3*  HCT 30.2* 32.2* 32.8*  MCV 91.8 92.0 90.9  MCH 31.0 30.3 31.3  MCHC 33.8 32.9 34.5  RDW 11.8 11.7 11.5  PLT 261 266 335   BNP Recent Labs  Lab 08/06/24 0146  BNP 241.1*     Radiology/Studies:  VAS US  LOWER EXTREMITY VENOUS (DVT) Result Date: 08/07/2024  Lower Venous DVT Study Patient Name:  Leroy Simmons  Date of Exam:   08/07/2024 Medical Rec #: [id number]       Accession #:    [id number] Date of Birth: [birth year]       Patient Gender: M Patient Age:   9 years Exam Location:  [facility] Procedure:      VAS US  LOWER EXTREMITY VENOUS (DVT) Referring Phys: JEFFREY HATCHER --------------------------------------------------------------------------------  Indications: SOB, and Edema (L>R).  Risk Factors: PE 2025 DVT LLE peroneal 2024. Anticoagulation: Eliquis  (non compliant). Comparison Study: Previous exam on 01/09/2024 showed partial thrombus in left                   peroneal vein. Performing Technologist: Ezzie Potters RVT, RDMS  Examination Guidelines: A complete evaluation includes B-mode imaging, spectral Doppler, color Doppler, and power Doppler as needed of all accessible portions of each vessel. Bilateral testing is considered an integral part of a complete examination. Limited examinations for reoccurring indications may be performed as noted. The reflux portion of the exam is performed with the patient in reverse Trendelenburg.  +---------+---------------+---------+-----------+----------+--------------+ RIGHT    CompressibilityPhasicitySpontaneityPropertiesThrombus Aging  +---------+---------------+---------+-----------+----------+--------------+ CFV      Full           Yes      Yes                                 +---------+---------------+---------+-----------+----------+--------------+ SFJ      Full                                                        +---------+---------------+---------+-----------+----------+--------------+ FV Prox  Full           Yes      Yes                                 +---------+---------------+---------+-----------+----------+--------------+ FV Mid   Full  Yes      Yes                                 +---------+---------------+---------+-----------+----------+--------------+ FV DistalFull           Yes      Yes                                 +---------+---------------+---------+-----------+----------+--------------+ PFV      Full                                                        +---------+---------------+---------+-----------+----------+--------------+ POP      Full           Yes      Yes                                 +---------+---------------+---------+-----------+----------+--------------+ PTV      Full                                                        +---------+---------------+---------+-----------+----------+--------------+ PERO     Full                                                        +---------+---------------+---------+-----------+----------+--------------+   +---------+---------------+---------+-----------+----------+--------------+ LEFT     CompressibilityPhasicitySpontaneityPropertiesThrombus Aging +---------+---------------+---------+-----------+----------+--------------+ CFV      Full           Yes      Yes                                 +---------+---------------+---------+-----------+----------+--------------+ SFJ      Full                                                         +---------+---------------+---------+-----------+----------+--------------+ FV Prox  Full           Yes      Yes                                 +---------+---------------+---------+-----------+----------+--------------+ FV Mid   Full           Yes      Yes                                 +---------+---------------+---------+-----------+----------+--------------+ FV DistalFull           Yes      Yes                                 +---------+---------------+---------+-----------+----------+--------------+  PFV      Full                                                        +---------+---------------+---------+-----------+----------+--------------+ POP      Full           Yes      Yes                                 +---------+---------------+---------+-----------+----------+--------------+ PTV      Full                                                        +---------+---------------+---------+-----------+----------+--------------+ PERO     Full                                                        +---------+---------------+---------+-----------+----------+--------------+     Summary: BILATERAL: - No evidence of deep vein thrombosis seen in the lower extremities, bilaterally. -No evidence of popliteal cyst, bilaterally. -Subcutaneous edema, bilaterally.  *See table(s) above for measurements and observations. Electronically signed by Lonni Gaskins MD on 08/07/2024 at 4:02:18 PM.    Final    NM Pulmonary Perfusion Result Date: 08/06/2024 EXAM: NM Lung Perfusion Scan. CLINICAL HISTORY: Pulmonary embolism (PE) suspected, high prob. TECHNIQUE: Radiolabeled MAA was administered intravenously and planar images of the lungs were obtained in multiple projections. RADIOPHARMACEUTICAL: 4.4 millicurie technetium-58m albumin  aggregated (MAA) injection solution. COMPARISON: X-ray at 11:45. FINDINGS: PERFUSION: No wedge-shaped peripheral perfusion defect within left or right  lung to suggest acute pulmonary embolism. Normal perfusion pattern. IMPRESSION: 1. No perfusion defects to indicate pulmonary embolism. Electronically signed by: Norleen Boxer MD 08/06/2024 02:35 PM EST RP Workstation: [id number]   ECHOCARDIOGRAM COMPLETE Result Date: 08/06/2024    ECHOCARDIOGRAM REPORT   Patient Name:   Leroy Simmons Date of Exam: 08/06/2024 Medical Rec #:  [id number]      Height:       68.0 in Accession #:    [id number]     Weight:       245.0 lb Date of Birth:  January 13, 1971      BSA:          2.228 m Patient Age:    52 years       BP:           158/102 mmHg Patient Gender: M              HR:           83 bpm. Exam Location:  Inpatient Procedure: 2D Echo (Both Spectral and Color Flow Doppler were utilized during            procedure). STAT ECHO Indications:    Dyspnea R06.00  History:        Patient has prior history of Echocardiogram examinations, most  recent 01/10/2024. Cardiomegaly, CAD, Pulmonary HTN,                 Signs/Symptoms:Chest Pain and Dyspnea; Risk                 Factors:Hypertension, Diabetes and Hyperlipidemia.  Sonographer:    BERNARDA ROCKS Referring Phys: [id number] HAYLEY N NAASZ IMPRESSIONS  1. Left ventricular ejection fraction, by estimation, is 60 to 65%. The left ventricle has normal function. The left ventricle has no regional wall motion abnormalities. Indeterminate diastolic filling due to E-A fusion.  2. Right ventricular systolic function is normal. The right ventricular size is normal. Tricuspid regurgitation signal is inadequate for assessing PA pressure.  3. Left atrial size was moderately dilated.  4. The mitral valve is normal in structure. No evidence of mitral valve regurgitation. No evidence of mitral stenosis.  5. The aortic valve was not well visualized. Aortic valve regurgitation is not visualized. No aortic stenosis is present.  6. The inferior vena cava is normal in size with greater than 50% respiratory variability, suggesting right atrial  pressure of 3 mmHg. Comparison(s): No significant change from prior study. Prior images reviewed side by side. FINDINGS  Left Ventricle: Left ventricular ejection fraction, by estimation, is 60 to 65%. The left ventricle has normal function. The left ventricle has no regional wall motion abnormalities. The left ventricular internal cavity size was normal in size. There is  no left ventricular hypertrophy. Indeterminate diastolic filling due to E-A fusion. Right Ventricle: The right ventricular size is normal. No increase in right ventricular wall thickness. Right ventricular systolic function is normal. Tricuspid regurgitation signal is inadequate for assessing PA pressure. Left Atrium: Left atrial size was moderately dilated. Right Atrium: Right atrial size was normal in size. Pericardium: There is no evidence of pericardial effusion. Mitral Valve: The mitral valve is normal in structure. No evidence of mitral valve regurgitation. No evidence of mitral valve stenosis. MV peak gradient, 9.5 mmHg. The mean mitral valve gradient is 3.0 mmHg. Tricuspid Valve: The tricuspid valve is normal in structure. Tricuspid valve regurgitation is not demonstrated. No evidence of tricuspid stenosis. Aortic Valve: The aortic valve was not well visualized. Aortic valve regurgitation is not visualized. No aortic stenosis is present. Aortic valve mean gradient measures 3.0 mmHg. Aortic valve peak gradient measures 6.8 mmHg. Aortic valve area, by VTI measures 4.35 cm. Pulmonic Valve: The pulmonic valve was normal in structure. Pulmonic valve regurgitation is not visualized. No evidence of pulmonic stenosis. Aorta: The aortic root and ascending aorta are structurally normal, with no evidence of dilitation. Venous: The inferior vena cava is normal in size with greater than 50% respiratory variability, suggesting right atrial pressure of 3 mmHg. IAS/Shunts: No atrial level shunt detected by color flow Doppler.  LEFT VENTRICLE PLAX 2D  LVIDd:         5.50 cm      Diastology LVIDs:         3.30 cm      LV e' medial:    6.74 cm/s LV PW:         0.90 cm      LV E/e' medial:  22.0 LV IVS:        1.00 cm      LV e' lateral:   9.03 cm/s LVOT diam:     2.30 cm      LV E/e' lateral: 16.4 LV SV:         92 LV SV Index:   41  LVOT Area:     4.15 cm  LV Volumes (MOD) LV vol d, MOD A2C: 173.0 ml LV vol d, MOD A4C: 183.0 ml LV vol s, MOD A2C: 57.9 ml LV vol s, MOD A4C: 80.0 ml LV SV MOD A2C:     115.1 ml LV SV MOD A4C:     183.0 ml LV SV MOD BP:      115.6 ml RIGHT VENTRICLE RV Basal diam:  3.60 cm RV S prime:     12.60 cm/s TAPSE (M-mode): 2.1 cm LEFT ATRIUM              Index        RIGHT ATRIUM           Index LA diam:        5.00 cm  2.24 cm/m   RA Area:     13.70 cm LA Vol (A2C):   82.4 ml  36.99 ml/m  RA Volume:   27.50 ml  12.34 ml/m LA Vol (A4C):   109.0 ml 48.93 ml/m LA Biplane Vol: 97.2 ml  43.63 ml/m  AORTIC VALVE                    PULMONIC VALVE AV Area (Vmax):    4.22 cm     PV Vmax:          1.01 m/s AV Area (Vmean):   3.57 cm     PV Peak grad:     4.0 mmHg AV Area (VTI):     4.35 cm     PR End Diast Vel: 2.93 msec AV Vmax:           130.00 cm/s AV Vmean:          82.700 cm/s AV VTI:            0.211 m AV Peak Grad:      6.8 mmHg AV Mean Grad:      3.0 mmHg LVOT Vmax:         132.00 cm/s LVOT Vmean:        71.000 cm/s LVOT VTI:          0.221 m LVOT/AV VTI ratio: 1.05  AORTA Ao Root diam: 3.40 cm Ao Asc diam:  3.60 cm MITRAL VALVE MV Area (PHT): 7.74 cm     SHUNTS MV Area VTI:   3.35 cm     Systemic VTI:  0.22 m MV Peak grad:  9.5 mmHg     Systemic Diam: 2.30 cm MV Mean grad:  3.0 mmHg MV Vmax:       1.54 m/s MV Vmean:      77.6 cm/s MV Decel Time: 98 msec MV E velocity: 148.00 cm/s MV A velocity: 109.00 cm/s MV E/A ratio:  1.36 Stanly Leavens MD Electronically signed by Stanly Leavens MD Signature Date/Time: 08/06/2024/12:04:35 PM    Final    DG Chest 2 View Result Date: 08/06/2024 CLINICAL DATA:  Shortness of breath.  EXAM: CHEST - 2 VIEW COMPARISON:  January 09, 2024 FINDINGS: The cardiac silhouette is mildly enlarged and unchanged in size. There is prominence of the pulmonary vasculature with mild, diffusely increased interstitial lung markings noted. No focal consolidation, pleural effusion or pneumothorax is identified. The visualized skeletal structures are unremarkable. IMPRESSION: Mild cardiomegaly with mild pulmonary vascular congestion. Electronically Signed   By: Suzen Dials M.D.   On: 08/06/2024 02:56    Assessment and Plan: Acute on chronic HFpEF Presenting with progressive shortness  of breath and peripheral edema BNP 241, mildly elevated.  Imaging shows signs of volume overload  Patient was given two doses of IV lasix , now receiving 120 mg q8h Cr 3.39 -> 4.4 since admission. Nephrology following, within nephrotic range. Albumin  2.6.  On exam appears, volume up though given degree of hypoalbuminemia hard to assess etiology based on physical exam.   Interesting patient has little to no DOE. At this point of diuresis would expect improvement in symptoms, though patient still reporting similar symptom severity in regards to his shortness of breath. With rising cr [though could be from entresto] would recommend RHC to assess for intravascular overload vs extravascular overload. Will discuss with Dr. Sheena  Diuresis per nephrology Given lack of systolic heart failure and degree of renal dysfunction, no indication for entresto usage and with rising creatinine would discontinue. Can revisit ARB usage later, though would defer that to nephrology. Unable to use SGLT2i and MRA 2/2 renal dysfunction.   Hypertension  BP: 142/91 Continue Hydralazine  10 TID Increase coreg  12.5 mg BID to 25 mg BID Diuresis as above  Hyperlipidemia  07/2024 LDL 136   HDL 37 Continue lipitor  80 mg   CAD Denied chest pain Defer antiplatelet 2/2 chronic anticoagulation  Coreg  as above.   Hx of DVT/PE Eliquis  5 mg  BID  Per primary  Anemia AKI on CKD T2DM Asthma GERD OSA, suspected  Risk Assessment/Risk Scores:     [place]  Heart Association (NYHA) Functional Class NYHA Class II   For questions or updates, please contact  [facility] Please consult [email or web address] for contact info under   Signed, Leontine LOISE Salen, PA-C  08/09/2024 11:35 AM  Patient seen and examined, note reviewed with the signed Advanced Practice Provider. I personally reviewed laboratory data, imaging studies and relevant notes. I independently examined the patient and formulated the important aspects of the plan. I have personally discussed the plan with the patient and/or family. Comments or changes to the note/plan are indicated below.  Acute on chronic Heart Failure preserved Ejection  Hypertension  Hyperlipidemia  CAD Hx of DVT/PE  Clinically he appears to be volume overloaded. Currently on Lasix  120 mg BID.  Clinically still appears volume up but for now we will keep this and monitor because his creatinine slightly bumped up today.  Nephrology is also following the patient.  Hopefully tomorrow we can again reassess the need for high-dose diuretics or transition for term on a Lasix  drip.  His shortness of breath could be multifactorial likely also does have untreated sleep apnea.  He will benefit from a right heart catheterization.  We can diurese him over the weekend and hopefully on Monday or see with his right heart catheterization.  Agree with stopping the Entresto for now. He is still hypertensive.  Has been adjusted to 25 mg twice daily. Hydralazine  can be increase if he continues to be hypertensive to 25 mg TID.  No anginal symptoms. No need for ischemic evaluation at this time.     Rosangelica Pevehouse DO, MS [facility] Attending Cardiologist [facility]  [address] [place], [place] [address] [phone] Website: [email or web address]

## 2024-08-09 NOTE — Progress Notes (Signed)
 Occupational Therapy Treatment Patient Details Name: Leroy Simmons MRN: [id number] DOB: 02-03-1971 Today's Date: 08/09/2024   History of present illness Pt is a 53 y.o male admitted 11/4 for BLE edema. Acute on chronic diastolic HF, and acute hypoxic respiratory failure. PMH: CHF, anxiety, depression, T2DM, HTN,  laparoscopic appendectomy 12/23 due to gangrenous appendicitis, NSTEMI, AKI, DVT, CAD, PE   OT comments  Pt. Seen for skilled OT treatment session.  Pt. Able to complete LB dressing with figure 4.  Reviewed energy conservation strategies for LB ADLs.  CGA/S for in room ambulation to/from b.room.  pt. With light furniture walking but no lob or safety issues noted.  Cont. With acute OT POC.        If plan is discharge home, recommend the following:      Equipment Recommendations       Recommendations for Other Services      Precautions / Restrictions Precautions Precautions: None Recall of Precautions/Restrictions: Intact       Mobility Bed Mobility               General bed mobility comments: received EOB    Transfers Overall transfer level: Modified independent Equipment used: None Transfers: Sit to/from Stand, Bed to chair/wheelchair/BSC Sit to Stand: Contact guard assist     Step pivot transfers: Contact guard assist           Balance                                           ADL either performed or assessed with clinical judgement   ADL Overall ADL's : Needs assistance/impaired                     Lower Body Dressing: Set up;Sitting/lateral leans Lower Body Dressing Details (indicate cue type and reason): Able to figure 4 legs Toilet Transfer: Contact guard assist;Ambulation Toilet Transfer Details (indicate cue type and reason): light furniture walking with use of b.room counter         Functional mobility during ADLs: Contact guard assist      Extremity/Trunk Assessment              Vision        Perception     Praxis     Communication Communication Communication: No apparent difficulties   Cognition Arousal: Alert Behavior During Therapy: WFL for tasks assessed/performed Cognition: No apparent impairments                               Following commands: Intact        Cueing   Cueing Techniques: Verbal cues  Exercises      Shoulder Instructions       General Comments  [place] stuffed animals in his room are larry and jimmy, pts. Dream is to be able to get to a zoo or place where he can hold monkeys in person and see them in their habitat    Pertinent Vitals/ Pain       Pain Assessment Pain Assessment: Faces Faces Pain Scale: Hurts a little bit Pain Location: RLE during figure four Pain Descriptors / Indicators: Discomfort Pain Intervention(s): Repositioned  Home Living  Prior Functioning/Environment              Frequency           Progress Toward Goals  OT Goals(current goals can now be found in the care plan section)  Progress towards OT goals: Progressing toward goals     Plan      Co-evaluation                 AM-PAC OT 6 Clicks Daily Activity     Outcome Measure                    End of Session        Activity Tolerance Patient tolerated treatment well   Patient Left with call bell/phone within reach;Other (comment);in chair   Nurse Communication Other (comment);Mobility status (rn states ok to work with pt., reviewed pt. up in recliner at end of session)        Time: 8764-8754 OT Time Calculation (min): 10 min  Charges: OT General Charges $OT Visit: 1 Visit OT Treatments $Self Care/Home Management : 8-22 mins  Randall, COTA/L Acute Rehabilitation [phone]   CHRISTELLA Nest Lorraine-COTA/L  08/09/2024, 2:05 PM

## 2024-08-10 ENCOUNTER — Inpatient Hospital Stay (HOSPITAL_COMMUNITY)

## 2024-08-10 DIAGNOSIS — E8809 Other disorders of plasma-protein metabolism, not elsewhere classified: Secondary | ICD-10-CM | POA: Diagnosis not present

## 2024-08-10 DIAGNOSIS — N184 Chronic kidney disease, stage 4 (severe): Secondary | ICD-10-CM | POA: Diagnosis not present

## 2024-08-10 DIAGNOSIS — E1122 Type 2 diabetes mellitus with diabetic chronic kidney disease: Secondary | ICD-10-CM | POA: Diagnosis not present

## 2024-08-10 DIAGNOSIS — I272 Pulmonary hypertension, unspecified: Secondary | ICD-10-CM | POA: Diagnosis not present

## 2024-08-10 DIAGNOSIS — Z794 Long term (current) use of insulin: Secondary | ICD-10-CM | POA: Diagnosis not present

## 2024-08-10 DIAGNOSIS — I132 Hypertensive heart and chronic kidney disease with heart failure and with stage 5 chronic kidney disease, or end stage renal disease: Secondary | ICD-10-CM | POA: Diagnosis not present

## 2024-08-10 DIAGNOSIS — Z79899 Other long term (current) drug therapy: Secondary | ICD-10-CM | POA: Diagnosis not present

## 2024-08-10 DIAGNOSIS — Z1152 Encounter for screening for COVID-19: Secondary | ICD-10-CM | POA: Diagnosis not present

## 2024-08-10 DIAGNOSIS — J9601 Acute respiratory failure with hypoxia: Secondary | ICD-10-CM | POA: Diagnosis not present

## 2024-08-10 DIAGNOSIS — E872 Acidosis, unspecified: Secondary | ICD-10-CM | POA: Diagnosis not present

## 2024-08-10 DIAGNOSIS — E785 Hyperlipidemia, unspecified: Secondary | ICD-10-CM | POA: Diagnosis not present

## 2024-08-10 DIAGNOSIS — N179 Acute kidney failure, unspecified: Secondary | ICD-10-CM | POA: Diagnosis not present

## 2024-08-10 DIAGNOSIS — Z23 Encounter for immunization: Secondary | ICD-10-CM | POA: Diagnosis not present

## 2024-08-10 DIAGNOSIS — D631 Anemia in chronic kidney disease: Secondary | ICD-10-CM | POA: Diagnosis not present

## 2024-08-10 DIAGNOSIS — I130 Hypertensive heart and chronic kidney disease with heart failure and stage 1 through stage 4 chronic kidney disease, or unspecified chronic kidney disease: Secondary | ICD-10-CM | POA: Diagnosis not present

## 2024-08-10 DIAGNOSIS — I2510 Atherosclerotic heart disease of native coronary artery without angina pectoris: Secondary | ICD-10-CM | POA: Diagnosis not present

## 2024-08-10 DIAGNOSIS — Z86718 Personal history of other venous thrombosis and embolism: Secondary | ICD-10-CM | POA: Diagnosis not present

## 2024-08-10 DIAGNOSIS — I5033 Acute on chronic diastolic (congestive) heart failure: Secondary | ICD-10-CM | POA: Diagnosis not present

## 2024-08-10 LAB — GLUCOSE, CAPILLARY
Glucose-Capillary: 206 mg/dL — ABNORMAL HIGH (ref 70–99)
Glucose-Capillary: 150 mg/dL — ABNORMAL HIGH (ref 70–99)
Glucose-Capillary: 257 mg/dL — ABNORMAL HIGH (ref 70–99)
Glucose-Capillary: 232 mg/dL — ABNORMAL HIGH (ref 70–99)

## 2024-08-10 LAB — RENAL FUNCTION PANEL
Albumin: 2.7 g/dL — ABNORMAL LOW (ref 3.5–5.0)
Anion gap: 14 (ref 5–15)
BUN: 64 mg/dL — ABNORMAL HIGH (ref 6–20)
CO2: 21 mmol/L — ABNORMAL LOW (ref 22–32)
Calcium: 8.2 mg/dL — ABNORMAL LOW (ref 8.9–10.3)
Chloride: 102 mmol/L (ref 98–111)
Creatinine, Ser: 4.95 mg/dL — ABNORMAL HIGH (ref 0.61–1.24)
GFR, Estimated: 13 mL/min — ABNORMAL LOW (ref >60)
Glucose, Bld: 249 mg/dL — ABNORMAL HIGH (ref 70–99)
Phosphorus: 5 mg/dL — ABNORMAL HIGH (ref 2.5–4.6)
Potassium: 3.7 mmol/L (ref 3.5–5.1)
Sodium: 137 mmol/L (ref 135–145)

## 2024-08-10 MED ORDER — LIDOCAINE 5 % EX PTCH
1.0000 | MEDICATED_PATCH | CUTANEOUS | Status: DC
  Administered 2024-08-10 – 2024-08-13 (×4): 1 via TRANSDERMAL
  Filled 2024-08-10 (×5): qty 1

## 2024-08-10 MED ORDER — METOLAZONE 5 MG PO TABS
5.0000 mg | ORAL_TABLET | Freq: Once | ORAL | Status: AC
  Administered 2024-08-10: 5 mg via ORAL
  Filled 2024-08-10: qty 1

## 2024-08-10 NOTE — Progress Notes (Addendum)
 Subjective: Leroy Simmons is a 53 y.o. with a pertinent PMH of HFpEF, PE 2025, CAD, asthma, T2DM, and CKD4 who presented with SOB and admitted for acute on chronic heart failure exacerbation.    Overnight events: Noncardiac chest pain, EKG and CXR reassuring   Today, patient continues to feel fine. He denies difficulty breathing or chest pain at this moment. Per patient, he had right sided chest pain that was intermittent overnight night, but not currently present.   Objective:  Vital signs in last 24 hours: Vitals:   08/10/24 0133 08/10/24 0406 08/10/24 0806 08/10/24 1140  BP:  (!) 146/86 (!) 141/86 (!) 141/88  Pulse:  75 72 73  Resp:  18 17 16   Temp:  98.2 F (36.8 C) (!) 97.4 F (36.3 C) 97.9 F (36.6 C)  TempSrc:  Oral Oral Oral  SpO2:  95% 97% 97%  Weight: 110.5 kg     Height:       Physical Exam: General:NAD, sitting on the side of the bed HEENT: Periorbital edema on exam  Cardiac:RRR, no murmur appreciated Pulmonary:normal effort on room air,  Neuro:awake, alert, responds to appropriately  FDX:ipqqldz anasarca on lower extremities extending to the mid back Skin:warm and dry  Psych:      Latest Ref Rng & Units 08/09/2024    3:08 AM 08/08/2024    5:59 AM 08/07/2024    3:22 AM  CBC  WBC 4.0 - 10.5 K/uL 8.4  8.7  8.5   Hemoglobin 13.0 - 17.0 g/dL 88.6  89.3  89.7   Hematocrit 39.0 - 52.0 % 32.8  32.2  30.2   Platelets 150 - 400 K/uL 335  266  261         Latest Ref Rng & Units 08/10/2024    7:00 AM 08/09/2024    4:47 PM 08/09/2024    3:08 AM  BMP  Glucose 70 - 99 mg/dL 750  835  764   BUN 6 - 20 mg/dL 64  60  57   Creatinine 0.61 - 1.24 mg/dL 5.04  5.21  5.58   Sodium 135 - 145 mmol/L 137  137  136   Potassium 3.5 - 5.1 mmol/L 3.7  3.9  3.5   Chloride 98 - 111 mmol/L 102  103  101   CO2 22 - 32 mmol/L 21  22  21    Calcium  8.9 - 10.3 mg/dL 8.2  8.2  8.3      Assessment/Plan:  Principal Problem:   Acute on chronic diastolic heart failure with preserved  ejection fraction (HCC) Active Problems:   Uncontrolled type 2 diabetes mellitus with hyperglycemia, with long-term current use of insulin  (HCC)   Anemia of chronic disease   History of DVT (deep vein thrombosis), LLE 2024   Chronic kidney disease (CKD), stage IV (severe) (HCC)   History of pulmonary embolism, LLL 01/2024   Peripheral edema  Acute on chronic diastolic heart failure exacerbation, LVEF 60-65% 08/2024 Patient remains grossly volume overloaded on exam with anasarca.  Fortunately, he has remained on room air and feels better symptomatically. UOP recorded at 1.3 L in the past 24 hours. Plan: -Cardiology consulted, appreciate recommendations  -Will monitor K closely, will not replace 3.7 this am with rising creatinine -Magnesium  tomorrow morning ordered -GDMT: Coreg  25 mg BID, will defer to nephrology for possible Ace/ARB in the future   CKD4 Nephrotic Range Proteinuria Nephro following, appreciate recommendations. UOP recorded at 1.3L over the past 24 hours. RRT not indicated  at this time as he is not symptomatically uremic, acidemic, hyperkalemic, or requiring additional oxygen support at this time. Cr rose this morning to 4.95 from 4.78 the day prior. Suspect rising creatinine is due to recent entresto use and possibly from high dose diuretics.  Plan:  -IV Lasix  120 mg Q8HR per nephrology - renal diet with salt and fluid restriction - consider ACEi/ARB for nephro protection  - Unna boots, encourage elevation  - Goal MAP > 65  Hypertension  BP remains elevated at 141/88 this morning.  Plan: -Hydralazine  10 mg TID, carvedilol  25 mg BID   Diabetes mellitus on chronic insulin  use A1c this admission 7.2. FBG this AM 150. - 25 units semglee  and 6 units TID with meals - Resistant SSI and at bedtime coverage   CAD Hyperlipidemia Left heart cath 03/2022 that revealed 50% D2, 40% mid LAD, and 25% ramus lesions.  - atorvastatin  80 mg daily - Carvedilol  25 mg bid    Hypercoagulable, VTE Hx BL Vas US  c/w subQ edema and w/o evidence of DVT - Eliquis  5 mg BID   Normocytic anemia with iron deficiency Hgb 11.5 on admission. Gave IV Iron 11/5, remains stable.   OSA suspected Patient awaiting sleep studies.  - CPAP at night   Asthma - PRN albuterol    GERD - pantoprazole  40 mg daily  Resolved Problems:  __________________________________  Code Status: Full VTE Prophylaxis: Eliquis  5mg  BID Diet:Renal diet IVF:N/A Barriers to Discharge:Evaluation of renal insufficiency and RHC Dispo: Anticipated discharge in approximately 3-5 day(s).   Kandis Perkins, DO 08/10/2024, 12:46 PM Please contact the on call pager at: [phone]

## 2024-08-10 NOTE — Progress Notes (Signed)
 0125--Evaluated patient at bedside for new onset right sided chest pain. Localized to right middle rib cage. Denies shortness of breath, nausea, or vomiting. EKG evaluated on machine at bedside without ST or T wave changes. Pt has noted to be coughing frequently for the past few days. Tender to palpation on exam. Suspect this is musculoskeletal in nature. CXR ordered to rule out acute process. Lidocaine  patch and tylenol  for pain.

## 2024-08-10 NOTE — Progress Notes (Signed)
 Patient refused to wear Cpap. Will be monitoring patient, cpap on standby

## 2024-08-10 NOTE — Progress Notes (Addendum)
 Nutrition Education Note  RD consulted for Renal/Low Sodium diet Education. RD working remotely and education completed over the phone. Reviewed food groups and provided written recommended serving sizes via AVS.  Explained why diet restrictions are needed and provided lists of foods to limit/avoid that are high potassium, sodium, and phosphorus. Provided specific recommendations on safer alternatives of these foods.  Patient reports trying not to order takeout as much as possible. Breakfast is usually a boiled egg with grits or oatmeal. Lunch is a grilled protein with vegetables. Dinner is usually similar. Of note, per chart review, he is homeless and has requested resources around food and housing accessibility.   Discussed individual labs and that potassium is within desirable range currently with marginal elevation in phosphorus. Re-iterated the importance of modifying diet based off individual lab trends and ways to do this if/when food options are more limited or rigid in nature. Discussed prioritizing foods based off sodium content as evidenced by reading food labels if/when available.  Focused predominantly on sources of hidden sodium and ways to manage fluid status. He often became short of breath with excessive coughing when trying to speak at length. Encouraged request for outpatient follow up with an RD and attendance at regular follow up appointments with nephrologist so lab trends can be monitored. Also encouraged adherence with diuretics to help maintain fluid balance. He verbalized understanding.   Discussed importance of protein intake at each meal and snack. Provided examples of how to maximize protein intake throughout the day. Discussed need for monitoring fluid intake and importance of minimizing fluid overload and renal-friendly beverage options.  Encouraged pt to discuss specific diet questions/concerns with MD at outpatient clinic with request for outpatient RD education if  indicated.  Expect adequate compliance.  Body mass index is 39.32 kg/m. Pt meets criteria for obesity, class II based on current BMI, however weight likely skewed by fluid overload.  Current diet order is renal/1200 ml fluid restriction, patient is consuming approximately 100% of meals at this time. Labs and medications reviewed. No further nutrition interventions warranted at this time. RD contact information provided. If additional nutrition issues arise, please re-consult RD.  Blair Deaner MS, RD, LDN Registered Dietitian Clinical Nutrition RD Inpatient Contact Info in Amion

## 2024-08-10 NOTE — Progress Notes (Signed)
 Mobility Specialist Progress Note:    08/10/24 1507  Mobility  Activity Ambulated with assistance  Level of Assistance Standby assist, set-up cues, supervision of patient - no hands on  Assistive Device Front wheel walker  Distance Ambulated (ft) 25 ft  Activity Response Tolerated fair  Mobility Referral Yes  Mobility visit 1 Mobility  Mobility Specialist Start Time (ACUTE ONLY) 1507  Mobility Specialist Stop Time (ACUTE ONLY) 1515  Mobility Specialist Time Calculation (min) (ACUTE ONLY) 8 min   Received pt in room agreeable to session. Pt not feeling as well today but still willing to try and do a little. Pt moving slowly but otherwise moving and ambulating decently. Returned pt to bed w/ all needs met.   Venetia Keel Mobility Specialist Please Neurosurgeon or Rehab Office at [phone]

## 2024-08-10 NOTE — Progress Notes (Addendum)
 Progress Note  Patient Name: Leroy Simmons Date of Encounter: 08/10/2024  Primary Cardiologist: Soyla DELENA Merck, MD   Subjective   Having some shortness of breath but improving. Urinating with the lasix .   Inpatient Medications    Scheduled Meds:  apixaban   5 mg Oral BID   atorvastatin   80 mg Oral QHS   carvedilol   25 mg Oral BID WC   dorzolamide -timolol   1 drop Both Eyes BID   guaiFENesin   600 mg Oral BID   hydrALAZINE   10 mg Oral Q8H   influenza vac split trivalent PF  0.5 mL Intramuscular Tomorrow-1000   insulin  aspart  0-20 Units Subcutaneous TID WC   insulin  aspart  0-5 Units Subcutaneous QHS   insulin  aspart  6 Units Subcutaneous TID WC   insulin  glargine-yfgn  25 Units Subcutaneous QHS   latanoprost   1 drop Both Eyes QHS   lidocaine   1 patch Transdermal Q24H   pantoprazole   40 mg Oral Daily   senna  1 tablet Oral BID   Continuous Infusions:  furosemide  120 mg (08/10/24 0637)   PRN Meds: acetaminophen  **OR** acetaminophen , albuterol    Vital Signs    Vitals:   08/10/24 0133 08/10/24 0406 08/10/24 0806 08/10/24 1140  BP:  (!) 146/86 (!) 141/86 (!) 141/88  Pulse:  75 72 73  Resp:  18 17 16   Temp:  98.2 F (36.8 C) (!) 97.4 F (36.3 C) 97.9 F (36.6 C)  TempSrc:  Oral Oral Oral  SpO2:  95% 97% 97%  Weight: 110.5 kg     Height:        Intake/Output Summary (Last 24 hours) at 08/10/2024 1246 Last data filed at 08/10/2024 1143 Gross per 24 hour  Intake 720 ml  Output 1300 ml  Net -580 ml   Filed Weights   08/09/24 0300 08/10/24 0002 08/10/24 0133  Weight: 110.7 kg 110.5 kg 110.5 kg    Telemetry    NSR - Personally Reviewed  ECG    Sinus rhythm, nonspecific ST changes - Personally Reviewed  Physical Exam   Physical Exam Vitals and nursing note reviewed.  HENT:     Head: Normocephalic and atraumatic.  Eyes:     Conjunctiva/sclera: Conjunctivae normal.  Cardiovascular:     Rate and Rhythm: Normal rate and regular rhythm.  Pulmonary:      Effort: Pulmonary effort is normal.     Breath sounds: Rales present.  Musculoskeletal:     Comments: Legs with wraps but there is at least 1-2+ edema at the knees/thighs  Skin:    Coloration: Skin is not jaundiced or pale.  Neurological:     Mental Status: He is alert.      Labs    Chemistry Recent Labs  Lab 08/06/24 0739 08/06/24 1546 08/07/24 2007 08/08/24 0559 08/09/24 0308 08/09/24 1647 08/10/24 0700  NA  --    < > 140   < > 136 137 137  K  --    < > 4.1   < > 3.5 3.9 3.7  CL  --    < > 104   < > 101 103 102  CO2  --    < > 24   < > 21* 22 21*  GLUCOSE  --    < > 193*   < > 235* 164* 249*  BUN  --    < > 50*   < > 57* 60* 64*  CREATININE  --    < > 3.95*   < >  4.41* 4.78* 4.95*  CALCIUM   --    < > 8.3*   < > 8.3* 8.2* 8.2*  PROT 6.5  --   --   --   --   --   --   ALBUMIN  2.6*   < > 2.4*  --  2.6*  --  2.7*  AST 22  --   --   --   --   --   --   ALT 26  --   --   --   --   --   --   ALKPHOS 133*  --   --   --   --   --   --   BILITOT 0.8  --   --   --   --   --   --   GFRNONAA  --    < > 18*   < > 15* 14* 13*  ANIONGAP  --    < > 12   < > 14 12 14    < > = values in this interval not displayed.     Hematology Recent Labs  Lab 08/07/24 0322 08/08/24 0559 08/09/24 0308  WBC 8.5 8.7 8.4  RBC 3.29* 3.50* 3.61*  HGB 10.2* 10.6* 11.3*  HCT 30.2* 32.2* 32.8*  MCV 91.8 92.0 90.9  MCH 31.0 30.3 31.3  MCHC 33.8 32.9 34.5  RDW 11.8 11.7 11.5  PLT 261 266 335    Cardiac EnzymesNo results for input(s): TROPONINI in the last 168 hours. No results for input(s): TROPIPOC in the last 168 hours.   BNP Recent Labs  Lab 08/06/24 0146  BNP 241.1*     DDimer No results for input(s): DDIMER in the last 168 hours.   Radiology    DG Chest 2 View Result Date: 08/10/2024 EXAM: 2 VIEW(S) XRAY OF THE CHEST 08/10/2024 02:50:33 AM COMPARISON: 08/06/2024 CLINICAL HISTORY: Chest pain at rest FINDINGS: LUNGS AND PLEURA: No focal pulmonary opacity. No pleural  effusion. No pneumothorax. HEART AND MEDIASTINUM: Cardiomegaly. BONES AND SOFT TISSUES: No acute osseous abnormality. IMPRESSION: 1. No acute cardiopulmonary process. Electronically signed by: Oneil Devonshire MD 08/10/2024 03:05 AM EST RP Workstation: [id number]    Cardiac Studies   Echocardiogram 11//25   1. Left ventricular ejection fraction, by estimation, is 60 to 65%. The  left ventricle has normal function. The left ventricle has no regional  wall motion abnormalities. Indeterminate diastolic filling due to E-A  fusion.   2. Right ventricular systolic function is normal. The right ventricular  size is normal. Tricuspid regurgitation signal is inadequate for assessing  PA pressure.   3. Left atrial size was moderately dilated.   4. The mitral valve is normal in structure. No evidence of mitral valve  regurgitation. No evidence of mitral stenosis.   5. The aortic valve was not well visualized. Aortic valve regurgitation  is not visualized. No aortic stenosis is present.   6. The inferior vena cava is normal in size with greater than 50%  respiratory variability, suggesting right atrial pressure of 3 mmHg.   Comparison(s): No significant change from prior study. Prior images  reviewed side by side.   Patient Profile     53 y.o. male with a h/o HFpEF, Nonobstructive CAD by LHC in 2023, history of DVT/PE on anticoagulation, Type 2DM (Hgb A1c 7.2), HTN, HLD, CKD, obesity, anxiety, and asthma. who is being seen 08/09/2024 for the evaluation of heart failure exacerbation at the request of Reyes Fenton, MD.   Assessment &  Plan   Acute on chronic diastolic heart failure- Net -4.7 L, weight relatively stable.  Creatinine continues to trend up to 4.5. still volume up but creatinine trending up as well. Continue on Lasix  120 mg every 8 hours for now but patient will likely require a RHC on Monday. Informed Consent   Shared Decision Making/Informed Consent The risks, including but not limited  to, [bleeding or vascular complications (1 in 500), pneumothorax (1 in 1600), arrhythmia (1 in 1000) and death (1 in 5000)], benefits (diagnostic support and/or management of heart failure, pulmonary hypertension) and alternatives of a right heart catheterization were discussed in detail with Mr. Schoenberg and he is willing to proceed. Hypertension-on hydralazine  10 mg 3 times daily, carvedilol  25 mg twice daily Hyperlipidemia-on Lipitor  80 mg CAD-on chronic anticoagulation and statin History of DVT/PE on chronic anticoagulation anemia AKI on CKD Type 2 diabetes Asthma GERD Suspected OSA     For questions or updates, please contact [facility] Please consult [email or web address] for contact info under        Signed, Emeline Calender, DO 08/10/2024, 12:46 PM

## 2024-08-10 NOTE — Plan of Care (Signed)
   Problem: Education: Goal: Ability to describe self-care measures that may prevent or decrease complications (Diabetes Survival Skills Education) will improve Outcome: Progressing Goal: Individualized Educational Video(s) Outcome: Progressing   Problem: Coping: Goal: Ability to adjust to condition or change in health will improve Outcome: Progressing

## 2024-08-10 NOTE — Progress Notes (Signed)
 Nephrology Progress Note:    Patient ID: Leroy Simmons, male   DOB: July 27, 1971, 53 y.o.   MRN: [id number]   Subjective:  He had 1.3 liters UOP over 11/7 and one unmeasured urine void.  Spoke with his partner at bedside   Review of systems:  Has Shortness of breath  No difficulty urinating - sometimes can't make it in time  No n/v   O:BP (!) 141/88 (BP Location: Right Arm)   Pulse 73   Temp 97.9 F (36.6 C) (Oral)   Resp 16   Ht 5' 6 (1.676 m)   Wt 110.5 kg   SpO2 97%   BMI 39.32 kg/m   Intake/Output Summary (Last 24 hours) at 08/10/2024 1421 Last data filed at 08/10/2024 1143 Gross per 24 hour  Intake 720 ml  Output 1300 ml  Net -580 ml   Intake/Output: I/O last 3 completed shifts: In: 960 [P.O.:960] Out: 2775 [Urine:2775]  Intake/Output this shift:  Total I/O In: 120 [P.O.:120] Out: 350 [Urine:350] Weight change: -0.204 kg  Physical exam:  General adult male in bed in no acute distress HEENT normocephalic atraumatic extraocular movements intact sclera anicteric Neck supple trachea midline Lungs basilar crackles; frequent cough with speech; on room air   Heart S1S2 no rub  Abdomen soft nontender distended/obese habitus Extremities 2-3+ edema with unna boots Psych normal mood and affect Neuro alert and oriented x 3 follows commands GU no foley   Recent Labs  Lab 08/06/24 0739 08/06/24 1546 08/07/24 0322 08/07/24 1115 08/07/24 2007 08/08/24 0559 08/09/24 0308 08/09/24 1647 08/10/24 0700  NA  --  141 142 139 140 141 136 137 137  K  --  3.7 3.7 3.7 4.1 3.8 3.5 3.9 3.7  CL  --  105 106 103 104 103 101 103 102  CO2  --  24 24 25 24 24  21* 22 21*  GLUCOSE  --  169* 126* 137* 193* 128* 235* 164* 249*  BUN  --  46* 48* 44* 50* 50* 57* 60* 64*  CREATININE  --  3.47* 3.53* 3.45* 3.95* 3.88* 4.41* 4.78* 4.95*  ALBUMIN  2.6* 2.6*  --   --  2.4*  --  2.6*  --  2.7*  CALCIUM   --  8.3* 8.3* 8.2* 8.3* 8.4* 8.3* 8.2* 8.2*  PHOS  --  3.6  --   --  3.8  --  4.6   --  5.0*  AST 22  --   --   --   --   --   --   --   --   ALT 26  --   --   --   --   --   --   --   --    Liver Function Tests: Recent Labs  Lab 08/06/24 0739 08/06/24 1546 08/07/24 2007 08/09/24 0308 08/10/24 0700  AST 22  --   --   --   --   ALT 26  --   --   --   --   ALKPHOS 133*  --   --   --   --   BILITOT 0.8  --   --   --   --   PROT 6.5  --   --   --   --   ALBUMIN  2.6*   < > 2.4* 2.6* 2.7*   < > = values in this interval not displayed.   No results for input(s): LIPASE, AMYLASE in the last 168 hours. No results  for input(s): AMMONIA in the last 168 hours. CBC: Recent Labs  Lab 08/06/24 0146 08/07/24 0322 08/08/24 0559 08/09/24 0308  WBC 8.9 8.5 8.7 8.4  HGB 11.5* 10.2* 10.6* 11.3*  HCT 34.9* 30.2* 32.2* 32.8*  MCV 93.6 91.8 92.0 90.9  PLT 316 261 266 335   Cardiac Enzymes: No results for input(s): CKTOTAL, CKMB, CKMBINDEX, TROPONINI in the last 168 hours. CBG: Recent Labs  Lab 08/09/24 1115 08/09/24 1606 08/09/24 2119 08/10/24 0618 08/10/24 1141  GLUCAP 212* 138* 233* 206* 150*    Iron Studies:  No results for input(s): IRON, TIBC, TRANSFERRIN, FERRITIN in the last 72 hours.  Studies/Results: DG Chest 2 View Result Date: 08/10/2024 EXAM: 2 VIEW(S) XRAY OF THE CHEST 08/10/2024 02:50:33 AM COMPARISON: 08/06/2024 CLINICAL HISTORY: Chest pain at rest FINDINGS: LUNGS AND PLEURA: No focal pulmonary opacity. No pleural effusion. No pneumothorax. HEART AND MEDIASTINUM: Cardiomegaly. BONES AND SOFT TISSUES: No acute osseous abnormality. IMPRESSION: 1. No acute cardiopulmonary process. Electronically signed by: Oneil Devonshire MD 08/10/2024 03:05 AM EST RP Workstation: [id number]    apixaban   5 mg Oral BID   atorvastatin   80 mg Oral QHS   carvedilol   25 mg Oral BID WC   dorzolamide -timolol   1 drop Both Eyes BID   guaiFENesin   600 mg Oral BID   hydrALAZINE   10 mg Oral Q8H   influenza vac split trivalent PF  0.5 mL Intramuscular  Tomorrow-1000   insulin  aspart  0-20 Units Subcutaneous TID WC   insulin  aspart  0-5 Units Subcutaneous QHS   insulin  aspart  6 Units Subcutaneous TID WC   insulin  glargine-yfgn  25 Units Subcutaneous QHS   latanoprost   1 drop Both Eyes QHS   lidocaine   1 patch Transdermal Q24H   pantoprazole   40 mg Oral Daily   senna  1 tablet Oral BID    BMET    Component Value Date/Time   NA 137 08/10/2024 0700   NA 142 07/08/2024 1427   K 3.7 08/10/2024 0700   CL 102 08/10/2024 0700   CO2 21 (L) 08/10/2024 0700   GLUCOSE 249 (H) 08/10/2024 0700   BUN 64 (H) 08/10/2024 0700   BUN 40 (H) 07/08/2024 1427   CREATININE 4.95 (H) 08/10/2024 0700   CALCIUM  8.2 (L) 08/10/2024 0700   GFRNONAA 13 (L) 08/10/2024 0700   GFRAA  10/26/2010 1143    >60        The eGFR has been calculated using the MDRD equation. This calculation has not been validated in all clinical situations. eGFR's persistently <60 mL/min signify possible Chronic Kidney Disease.   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   Previously followed with [facility] however lost to follow-up - hasn't seen them since January 2024 and patient asks to be seen and [facility] as he lives  locally.   High risk of progression to dialysis dependence, not indicated currently GFR worsened likely from Entresto initiation and ongoing IV diuretics Would stop Entresto, marginal for use  here with very low GFR  Continue current dosing of Lasix   Consult dietitian for low-sodium diet   HFpEF LVEF 60%  continue lasix . Will give metolazone  5 mg once today His weight is unchanged.   Not tolerating entresto Hypertension: Trend with diuresis Renal osteodystrophy - vitamin D deficiency; normal calcium  and mild hyperphos, PTH is pending Normocytic Anemia -TSAT 12% F116 -> already given Venofer 500mg . Hb 11.3 on last check DM -  per primary team  H/o PE on Eliquis   Per charting, we have requested close follow-up with [facility]   Katheryn JAYSON Saba, MD  [facility] 08/10/2024 2:51 PM

## 2024-08-10 NOTE — Plan of Care (Signed)
  Problem: Education: Goal: Ability to describe self-care measures that may prevent or decrease complications (Diabetes Survival Skills Education) will improve Outcome: Progressing   Problem: Coping: Goal: Ability to adjust to condition or change in health will improve Outcome: Progressing

## 2024-08-11 DIAGNOSIS — E785 Hyperlipidemia, unspecified: Secondary | ICD-10-CM | POA: Diagnosis not present

## 2024-08-11 DIAGNOSIS — E1165 Type 2 diabetes mellitus with hyperglycemia: Secondary | ICD-10-CM | POA: Diagnosis not present

## 2024-08-11 DIAGNOSIS — I5033 Acute on chronic diastolic (congestive) heart failure: Secondary | ICD-10-CM | POA: Diagnosis not present

## 2024-08-11 DIAGNOSIS — Z7901 Long term (current) use of anticoagulants: Secondary | ICD-10-CM | POA: Diagnosis not present

## 2024-08-11 DIAGNOSIS — D509 Iron deficiency anemia, unspecified: Secondary | ICD-10-CM | POA: Diagnosis not present

## 2024-08-11 DIAGNOSIS — N184 Chronic kidney disease, stage 4 (severe): Secondary | ICD-10-CM | POA: Diagnosis not present

## 2024-08-11 DIAGNOSIS — D631 Anemia in chronic kidney disease: Secondary | ICD-10-CM | POA: Diagnosis not present

## 2024-08-11 DIAGNOSIS — Z794 Long term (current) use of insulin: Secondary | ICD-10-CM | POA: Diagnosis not present

## 2024-08-11 DIAGNOSIS — E1122 Type 2 diabetes mellitus with diabetic chronic kidney disease: Secondary | ICD-10-CM | POA: Diagnosis not present

## 2024-08-11 DIAGNOSIS — I130 Hypertensive heart and chronic kidney disease with heart failure and stage 1 through stage 4 chronic kidney disease, or unspecified chronic kidney disease: Secondary | ICD-10-CM | POA: Diagnosis not present

## 2024-08-11 DIAGNOSIS — I2510 Atherosclerotic heart disease of native coronary artery without angina pectoris: Secondary | ICD-10-CM | POA: Diagnosis not present

## 2024-08-11 LAB — RENAL FUNCTION PANEL
Albumin: 2.8 g/dL — ABNORMAL LOW (ref 3.5–5.0)
Anion gap: 15 (ref 5–15)
BUN: 72 mg/dL — ABNORMAL HIGH (ref 6–20)
CO2: 23 mmol/L (ref 22–32)
Calcium: 8.4 mg/dL — ABNORMAL LOW (ref 8.9–10.3)
Chloride: 100 mmol/L (ref 98–111)
Creatinine, Ser: 4.73 mg/dL — ABNORMAL HIGH (ref 0.61–1.24)
GFR, Estimated: 14 mL/min — ABNORMAL LOW (ref >60)
Glucose, Bld: 175 mg/dL — ABNORMAL HIGH (ref 70–99)
Phosphorus: 5.1 mg/dL — ABNORMAL HIGH (ref 2.5–4.6)
Potassium: 3.6 mmol/L (ref 3.5–5.1)
Sodium: 138 mmol/L (ref 135–145)

## 2024-08-11 LAB — CBC
HCT: 35.3 % — ABNORMAL LOW (ref 39.0–52.0)
Hemoglobin: 12 g/dL — ABNORMAL LOW (ref 13.0–17.0)
MCH: 30.7 pg (ref 26.0–34.0)
MCHC: 34 g/dL (ref 30.0–36.0)
MCV: 90.3 fL (ref 80.0–100.0)
Platelets: 372 K/uL (ref 150–400)
RBC: 3.91 MIL/uL — ABNORMAL LOW (ref 4.22–5.81)
RDW: 11.5 % (ref 11.5–15.5)
WBC: 8.2 K/uL (ref 4.0–10.5)
nRBC: 0 % (ref 0.0–0.2)

## 2024-08-11 LAB — GLUCOSE, CAPILLARY
Glucose-Capillary: 149 mg/dL — ABNORMAL HIGH (ref 70–99)
Glucose-Capillary: 182 mg/dL — ABNORMAL HIGH (ref 70–99)
Glucose-Capillary: 166 mg/dL — ABNORMAL HIGH (ref 70–99)
Glucose-Capillary: 242 mg/dL — ABNORMAL HIGH (ref 70–99)

## 2024-08-11 LAB — MAGNESIUM: Magnesium: 2.2 mg/dL (ref 1.7–2.4)

## 2024-08-11 MED ORDER — POTASSIUM CHLORIDE CRYS ER 20 MEQ PO TBCR
20.0000 meq | EXTENDED_RELEASE_TABLET | Freq: Once | ORAL | Status: AC
  Administered 2024-08-11: 20 meq via ORAL
  Filled 2024-08-11: qty 1

## 2024-08-11 MED ORDER — INSULIN GLARGINE-YFGN 100 UNIT/ML ~~LOC~~ SOLN
12.0000 [IU] | Freq: Every day | SUBCUTANEOUS | Status: DC
  Filled 2024-08-11: qty 0.12

## 2024-08-11 MED ORDER — INSULIN GLARGINE-YFGN 100 UNIT/ML ~~LOC~~ SOLN
25.0000 [IU] | Freq: Every day | SUBCUTANEOUS | Status: DC
  Administered 2024-08-11: 25 [IU] via SUBCUTANEOUS
  Filled 2024-08-11 (×2): qty 0.25

## 2024-08-11 NOTE — Progress Notes (Signed)
 Mobility Specialist Progress Note:    08/11/24 1000  Mobility  Activity Ambulated with assistance  Level of Assistance Standby assist, set-up cues, supervision of patient - no hands on  Assistive Device Front wheel walker  Distance Ambulated (ft) 150 ft  Range of Motion/Exercises Active  Activity Response Tolerated well  Mobility Referral Yes  Mobility visit 1 Mobility  Mobility Specialist Start Time (ACUTE ONLY) 1000  Mobility Specialist Stop Time (ACUTE ONLY) 1009  Mobility Specialist Time Calculation (min) (ACUTE ONLY) 9 min   Received pt sitting on EOB agreeable to session. No c/o any symptoms. Pt moving and ambulating well needing cueing to stay within walker. Returned pt to room w/ all needs met.   Venetia Keel Mobility Specialist Please Neurosurgeon or Rehab Office at [phone]

## 2024-08-11 NOTE — Progress Notes (Signed)
 Nephrology Progress Note:    Patient ID: Leroy Simmons, male   DOB: [birth year], 53 y.o.   MRN: [id number]   Subjective:  He had 2.8 liters UOP over 11/8.  He has been on lasix  120 mg IV every 8 hours and got a dose of metolazone  yesterday.  His partner is at bedside and is very worried about him; he asks about dietary recs and we reviewed need for low sodium diet and diabetic diet.  They were discouraged to hear about the likelihood of dialysis at some point soon.  Discussed that the ideal goal would be an outpatient AVF soon.   Review of systems:   Shortness of breath is much better No chest pain  No difficulty urinating - sometimes can't make it in time  No n/v   O:BP (!) 142/92 (BP Location: Left Arm)   Pulse 78   Temp 98 F (36.7 C) (Oral)   Resp 17   Ht 5' 8 (1.727 m)   Wt 107.9 kg   SpO2 99%   BMI 36.17 kg/m   Intake/Output Summary (Last 24 hours) at 08/11/2024 1115 Last data filed at 08/11/2024 0950 Gross per 24 hour  Intake 360 ml  Output 3450 ml  Net -3090 ml   Intake/Output: I/O last 3 completed shifts: In: 480 [P.O.:480] Out: 3300 [Urine:3300]  Intake/Output this shift:  Total I/O In: 120 [P.O.:120] Out: 700 [Urine:700] Weight change:   Physical exam:    General adult male in bed in no acute distress HEENT normocephalic atraumatic extraocular movements intact sclera anicteric Neck supple trachea midline Lungs reduced breath sounds; normal work of breathing no coughing; on room air   Heart S1S2 no rub  Abdomen soft nontender distended/obese habitus Extremities 1-2+ edema with unna boots Psych normal mood and affect Neuro alert and oriented x 3 follows commands GU no foley   Recent Labs  Lab 08/06/24 0739 08/06/24 1546 08/07/24 0322 08/07/24 1115 08/07/24 2007 08/08/24 0559 08/09/24 0308 08/09/24 1647 08/10/24 0700 08/11/24 0221  NA  --  141   < > 139 140 141 136 137 137 138  K  --  3.7   < > 3.7 4.1 3.8 3.5 3.9 3.7 3.6  CL  --  105   < >  103 104 103 101 103 102 100  CO2  --  24   < > 25 24 24  21* 22 21* 23  GLUCOSE  --  169*   < > 137* 193* 128* 235* 164* 249* 175*  BUN  --  46*   < > 44* 50* 50* 57* 60* 64* 72*  CREATININE  --  3.47*   < > 3.45* 3.95* 3.88* 4.41* 4.78* 4.95* 4.73*  ALBUMIN  2.6* 2.6*  --   --  2.4*  --  2.6*  --  2.7* 2.8*  CALCIUM   --  8.3*   < > 8.2* 8.3* 8.4* 8.3* 8.2* 8.2* 8.4*  PHOS  --  3.6  --   --  3.8  --  4.6  --  5.0* 5.1*  AST 22  --   --   --   --   --   --   --   --   --   ALT 26  --   --   --   --   --   --   --   --   --    < > = values in this interval not displayed.   Liver Function Tests:  Recent Labs  Lab 08/06/24 0739 08/06/24 1546 08/09/24 0308 08/10/24 0700 08/11/24 0221  AST 22  --   --   --   --   ALT 26  --   --   --   --   ALKPHOS 133*  --   --   --   --   BILITOT 0.8  --   --   --   --   PROT 6.5  --   --   --   --   ALBUMIN  2.6*   < > 2.6* 2.7* 2.8*   < > = values in this interval not displayed.   No results for input(s): LIPASE, AMYLASE in the last 168 hours. No results for input(s): AMMONIA in the last 168 hours. CBC: Recent Labs  Lab 08/06/24 0146 08/07/24 0322 08/08/24 0559 08/09/24 0308 08/11/24 0221  WBC 8.9 8.5 8.7 8.4 8.2  HGB 11.5* 10.2* 10.6* 11.3* 12.0*  HCT 34.9* 30.2* 32.2* 32.8* 35.3*  MCV 93.6 91.8 92.0 90.9 90.3  PLT 316 261 266 335 372   Cardiac Enzymes: No results for input(s): CKTOTAL, CKMB, CKMBINDEX, TROPONINI in the last 168 hours. CBG: Recent Labs  Lab 08/10/24 1141 08/10/24 1649 08/10/24 2107 08/11/24 0603 08/11/24 1059  GLUCAP 150* 257* 232* 149* 182*    Iron Studies:  No results for input(s): IRON, TIBC, TRANSFERRIN, FERRITIN in the last 72 hours.  Studies/Results: DG Chest 2 View Result Date: 08/10/2024 EXAM: 2 VIEW(S) XRAY OF THE CHEST 08/10/2024 02:50:33 AM COMPARISON: 08/06/2024 CLINICAL HISTORY: Chest pain at rest FINDINGS: LUNGS AND PLEURA: No focal pulmonary opacity. No pleural effusion. No  pneumothorax. HEART AND MEDIASTINUM: Cardiomegaly. BONES AND SOFT TISSUES: No acute osseous abnormality. IMPRESSION: 1. No acute cardiopulmonary process. Electronically signed by: Oneil Devonshire MD 08/10/2024 03:05 AM EST RP Workstation: [id number]    apixaban   5 mg Oral BID   atorvastatin   80 mg Oral QHS   carvedilol   25 mg Oral BID WC   dorzolamide -timolol   1 drop Both Eyes BID   guaiFENesin   600 mg Oral BID   hydrALAZINE   10 mg Oral Q8H   influenza vac split trivalent PF  0.5 mL Intramuscular Tomorrow-1000   insulin  aspart  0-20 Units Subcutaneous TID WC   insulin  aspart  0-5 Units Subcutaneous QHS   insulin  aspart  6 Units Subcutaneous TID WC   insulin  glargine-yfgn  12 Units Subcutaneous QHS   latanoprost   1 drop Both Eyes QHS   lidocaine   1 patch Transdermal Q24H   pantoprazole   40 mg Oral Daily   senna  1 tablet Oral BID    BMET    Component Value Date/Time   NA 138 08/11/2024 0221   NA 142 07/08/2024 1427   K 3.6 08/11/2024 0221   CL 100 08/11/2024 0221   CO2 23 08/11/2024 0221   GLUCOSE 175 (H) 08/11/2024 0221   BUN 72 (H) 08/11/2024 0221   BUN 40 (H) 07/08/2024 1427   CREATININE 4.73 (H) 08/11/2024 0221   CALCIUM  8.4 (L) 08/11/2024 0221   GFRNONAA 14 (L) 08/11/2024 0221   GFRAA  10/26/2010 1143    >60        The eGFR has been calculated using the MDRD equation. This calculation has not been validated in all clinical situations. eGFR's persistently <60 mL/min signify possible Chronic Kidney Disease.   CBC    Component Value Date/Time   WBC 8.2 08/11/2024 0221   RBC 3.91 (L) 08/11/2024 0221   HGB 12.0 (  L) 08/11/2024 0221   HGB 13.2 04/25/2024 1607   HCT 35.3 (L) 08/11/2024 0221   HCT 41.3 04/25/2024 1607   PLT 372 08/11/2024 0221   PLT 259 04/25/2024 1607   MCV 90.3 08/11/2024 0221   MCV 94 04/25/2024 1607   MCH 30.7 08/11/2024 0221   MCHC 34.0 08/11/2024 0221   RDW 11.5 08/11/2024 0221   RDW 13.3 04/25/2024 1607   LYMPHSABS 2.1 04/25/2024 1607    MONOABS 0.8 01/09/2024 0534   EOSABS 0.2 04/25/2024 1607   BASOSABS 0.0 04/25/2024 1607    Assessment/Plan: AKI on CKDIV from diabetic nephropathy.   GFR worsened likely from Entresto initiation and ongoing IV diuretics High risk of progression to dialysis dependence, not indicated currently Would stop Entresto, marginal for use here with very low GFR  Continue current dosing of Lasix     HFpEF LVEF 60%  continue lasix .  Tomorrow would assess reducing in lasix  to BID dosing and then goal over time to transition back to torsemide  as tolerated.  May or may not need metolazone  weekly PRN Gave metolazone  5 mg once on 11/8 Weight downtrending Not tolerating entresto - would not restart Dietitian has seen pt to educate re: low-sodium diet  CKD stage IV Previously followed with [facility] however lost to follow-up - hasn't seen them since January 2024.  Patient has asked to be seen and [facility] as he lives locally.   Baseline Cr near 3 Per charting, we have requested close follow-up with [facility].  Would ensure this is set up as that request was made a few days ago and he is still here  Hypertension: Trend with diuresis Renal osteodystrophy - vitamin D deficiency; normal calcium  and mild hyperphos, PTH is pending Normocytic Anemia -TSAT 12% F116 -> already given Venofer 500mg . Improved and mild DM -  per primary team  H/o PE on Eliquis   Disposition - would continue inpatient monitoring.    Katheryn JAYSON Saba, MD  [facility] 08/11/2024 11:49 AM

## 2024-08-11 NOTE — Progress Notes (Signed)
 Progress Note  Patient Name: Leroy Simmons Date of Encounter: 08/11/2024  Primary Cardiologist: Soyla DELENA Merck, MD   Subjective   Productive cough but starting to feel better. Partner at bedside. All questions and concerns addressed.   Inpatient Medications    Scheduled Meds:  apixaban   5 mg Oral BID   atorvastatin   80 mg Oral QHS   carvedilol   25 mg Oral BID WC   dorzolamide -timolol   1 drop Both Eyes BID   guaiFENesin   600 mg Oral BID   hydrALAZINE   10 mg Oral Q8H   influenza vac split trivalent PF  0.5 mL Intramuscular Tomorrow-1000   insulin  aspart  0-20 Units Subcutaneous TID WC   insulin  aspart  0-5 Units Subcutaneous QHS   insulin  aspart  6 Units Subcutaneous TID WC   insulin  glargine-yfgn  12 Units Subcutaneous QHS   latanoprost   1 drop Both Eyes QHS   lidocaine   1 patch Transdermal Q24H   pantoprazole   40 mg Oral Daily   senna  1 tablet Oral BID   Continuous Infusions:  furosemide  120 mg (08/11/24 0617)   PRN Meds: acetaminophen  **OR** acetaminophen , albuterol    Vital Signs    Vitals:   08/10/24 1140 08/10/24 1648 08/10/24 2020 08/11/24 0055  BP: (!) 141/88 126/79 109/67 130/88  Pulse: 73 79    Resp: 16 17 16    Temp: 97.9 F (36.6 C) 97.8 F (36.6 C) 98.1 F (36.7 C) 98.5 F (36.9 C)  TempSrc: Oral Oral Oral Oral  SpO2: 97% 98% 99% 99%  Weight:      Height:        Intake/Output Summary (Last 24 hours) at 08/11/2024 0742 Last data filed at 08/11/2024 0617 Gross per 24 hour  Intake 360 ml  Output 2750 ml  Net -2390 ml   Filed Weights   08/09/24 0300 08/10/24 0002 08/10/24 0133  Weight: 110.7 kg 110.5 kg 110.5 kg    Telemetry    NSR - Personally Reviewed  ECG    Sinus rhythm, nonspecific ST changes - Personally Reviewed  Physical Exam   Physical Exam Vitals and nursing note reviewed.  Constitutional:      Appearance: Normal appearance.  HENT:     Head: Normocephalic and atraumatic.  Eyes:     Conjunctiva/sclera: Conjunctivae  normal.  Neck:     Vascular: No carotid bruit.  Cardiovascular:     Rate and Rhythm: Normal rate and regular rhythm.  Pulmonary:     Breath sounds: Rales present.  Musculoskeletal:     Right lower leg: Edema present.     Left lower leg: Edema present.  Skin:    Coloration: Skin is not jaundiced or pale.  Neurological:     Mental Status: He is alert.      Labs    Chemistry Recent Labs  Lab 08/06/24 0739 08/06/24 1546 08/09/24 0308 08/09/24 1647 08/10/24 0700 08/11/24 0221  NA  --    < > 136 137 137 138  K  --    < > 3.5 3.9 3.7 3.6  CL  --    < > 101 103 102 100  CO2  --    < > 21* 22 21* 23  GLUCOSE  --    < > 235* 164* 249* 175*  BUN  --    < > 57* 60* 64* 72*  CREATININE  --    < > 4.41* 4.78* 4.95* 4.73*  CALCIUM   --    < >  8.3* 8.2* 8.2* 8.4*  PROT 6.5  --   --   --   --   --   ALBUMIN  2.6*   < > 2.6*  --  2.7* 2.8*  AST 22  --   --   --   --   --   ALT 26  --   --   --   --   --   ALKPHOS 133*  --   --   --   --   --   BILITOT 0.8  --   --   --   --   --   GFRNONAA  --    < > 15* 14* 13* 14*  ANIONGAP  --    < > 14 12 14 15    < > = values in this interval not displayed.     Hematology Recent Labs  Lab 08/08/24 0559 08/09/24 0308 08/11/24 0221  WBC 8.7 8.4 8.2  RBC 3.50* 3.61* 3.91*  HGB 10.6* 11.3* 12.0*  HCT 32.2* 32.8* 35.3*  MCV 92.0 90.9 90.3  MCH 30.3 31.3 30.7  MCHC 32.9 34.5 34.0  RDW 11.7 11.5 11.5  PLT 266 335 372    Cardiac EnzymesNo results for input(s): TROPONINI in the last 168 hours. No results for input(s): TROPIPOC in the last 168 hours.   BNP Recent Labs  Lab 08/06/24 0146  BNP 241.1*     DDimer No results for input(s): DDIMER in the last 168 hours.   Radiology    DG Chest 2 View Result Date: 08/10/2024 EXAM: 2 VIEW(S) XRAY OF THE CHEST 08/10/2024 02:50:33 AM COMPARISON: 08/06/2024 CLINICAL HISTORY: Chest pain at rest FINDINGS: LUNGS AND PLEURA: No focal pulmonary opacity. No pleural effusion. No pneumothorax.  HEART AND MEDIASTINUM: Cardiomegaly. BONES AND SOFT TISSUES: No acute osseous abnormality. IMPRESSION: 1. No acute cardiopulmonary process. Electronically signed by: Oneil Devonshire MD 08/10/2024 03:05 AM EST RP Workstation: [id number]    Cardiac Studies   Echocardiogram 11//25   1. Left ventricular ejection fraction, by estimation, is 60 to 65%. The  left ventricle has normal function. The left ventricle has no regional  wall motion abnormalities. Indeterminate diastolic filling due to E-A  fusion.   2. Right ventricular systolic function is normal. The right ventricular  size is normal. Tricuspid regurgitation signal is inadequate for assessing  PA pressure.   3. Left atrial size was moderately dilated.   4. The mitral valve is normal in structure. No evidence of mitral valve  regurgitation. No evidence of mitral stenosis.   5. The aortic valve was not well visualized. Aortic valve regurgitation  is not visualized. No aortic stenosis is present.   6. The inferior vena cava is normal in size with greater than 50%  respiratory variability, suggesting right atrial pressure of 3 mmHg.   Comparison(s): No significant change from prior study. Prior images  reviewed side by side.   Patient Profile     53 y.o. male with a h/o HFpEF, Nonobstructive CAD by LHC in 2023, history of DVT/PE on anticoagulation, Type 2DM (Hgb A1c 7.2), HTN, HLD, CKD, obesity, anxiety, and asthma. who is being seen 08/09/2024 for the evaluation of heart failure exacerbation at the request of Reyes Fenton, MD.   Assessment & Plan   Acute on chronic diastolic heart failure- Net -6.9 L, weight relatively stable.  Creatinine 4.95>4.7 following addition of metolazone . Volume status and renal function has improved with addition of metolazone . Agree with nephrology diuretic regimen. Will hold off  on right heart cath for now.  Hypertension-on hydralazine  10 mg 3 times daily, carvedilol  25 mg twice daily Hyperlipidemia-on  Lipitor  80 mg CAD-on chronic anticoagulation and statin History of DVT/PE on chronic anticoagulation anemia AKI on CKD Type 2 diabetes Asthma GERD Suspected OSA     For questions or updates, please contact [facility] Please consult [email or web address] for contact info under        Signed, Emeline Calender, DO 08/11/2024, 7:42 AM

## 2024-08-11 NOTE — Progress Notes (Addendum)
 Subjective: Leroy Simmons is a 53 y.o. with a pertinent PMH of HFpEF, PE 2025, CAD, asthma, T2DM, and CKD4 who presented with SOB and admitted for acute on chronic heart failure exacerbation.    Overnight events: None  Today, patient reports that he did not sleep well last night due to fear that he may end up on dialysis.  He reported that most of the people that he knows to end up on dialysis ended up dying after starting dialysis.  He is interested in speaking with a chaplain.  He denied nausea, vomiting, metallic taste in mouth, difficulty breathing.  He did report some pruritus of his chest and arms.  Per patient, the pruritus started maybe 1 to 2 days ago and he is not sure if it is because of the telemetry leads.  Objective:  Vital signs in last 24 hours: Vitals:   08/11/24 0055 08/11/24 0730 08/11/24 0843 08/11/24 1100  BP: 130/88 123/74  (!) 142/92  Pulse:  75  78  Resp:  17  17  Temp: 98.5 F (36.9 C) 97.8 F (36.6 C)  98 F (36.7 C)  TempSrc: Oral Oral  Oral  SpO2: 99% 98%  99%  Weight:   107.9 kg   Height:   5' 8 (1.727 m)    Physical Exam: General: No acute distress, sitting in bed Cardiac: Regular rate and rhythm Pulmonary: Distant breath sounds bilaterally, no crackles or wheezes auscultated, normal effort on room air Neuro: Awake, alert, participating in conversation appropriately MSK: 2+ pitting edema extending to the posterior thighs Skin: Warm and dry Psych: Normal affect, normal mood     Latest Ref Rng & Units 08/11/2024    2:21 AM 08/09/2024    3:08 AM 08/08/2024    5:59 AM  CBC  WBC 4.0 - 10.5 K/uL 8.2  8.4  8.7   Hemoglobin 13.0 - 17.0 g/dL 87.9  88.6  89.3   Hematocrit 39.0 - 52.0 % 35.3  32.8  32.2   Platelets 150 - 400 K/uL 372  335  266         Latest Ref Rng & Units 08/11/2024    2:21 AM 08/10/2024    7:00 AM 08/09/2024    4:47 PM  BMP  Glucose 70 - 99 mg/dL 824  750  835   BUN 6 - 20 mg/dL 72  64  60   Creatinine 0.61 - 1.24 mg/dL 5.26   5.04  5.21   Sodium 135 - 145 mmol/L 138  137  137   Potassium 3.5 - 5.1 mmol/L 3.6  3.7  3.9   Chloride 98 - 111 mmol/L 100  102  103   CO2 22 - 32 mmol/L 23  21  22    Calcium  8.9 - 10.3 mg/dL 8.4  8.2  8.2      Assessment/Plan:  Principal Problem:   Acute on chronic diastolic heart failure with preserved ejection fraction (HCC) Active Problems:   Uncontrolled type 2 diabetes mellitus with hyperglycemia, with long-term current use of insulin  (HCC)   Anemia of chronic disease   History of DVT (deep vein thrombosis), LLE 2024   Chronic kidney disease (CKD), stage IV (severe) (HCC)   History of pulmonary embolism, LLL 01/2024   Peripheral edema  Acute on chronic diastolic heart failure exacerbation, LVEF 60-65% 08/2024 Patient remains grossly volume overloaded on exam. Fortunately, he has remained on room air and feels better symptomatically. UOP recorded at 2.75 L in the past 24  hours. Plan: -Cardiology consulted, appreciate recommendations  -Will monitor K closely, given today -Magnesium  is 2.2, no replacement required today  -GDMT: Coreg  25 mg BID, will defer to nephrology for possible Ace/ARB in the future  -RHC tomorrow, NPO at midnight   CKD4 Nephrotic Range Proteinuria Nephro following, appreciate recommendations. UOP recorded at 2.75L over the past 24 hours. RRT not indicated at this time as he is not symptomatically uremic, acidemic, hyperkalemic, or requiring additional oxygen support at this time. Cr starting to decrease: 4.95 yesterday to 4.73 today. Suspect creatinine elevation is due to recent entresto use and possibly from high dose diuretics.  Plan:  -IV Lasix  120 mg Q8HR per nephrology, s/p metolazone  5 mg 11/8 - renal diet with salt and fluid restriction - Unna boots, encourage elevation   Hypertension  BP remains elevated at 142/92 this morning.  Plan: -Hydralazine  10 mg TID, carvedilol  25 mg BID   Diabetes mellitus on chronic insulin  use A1c this  admission 7.2. FBG this AM 150. - Will decrease patient's Semglee  to 12 units tonight due to n.p.o. status at midnight - 6 units TID with meals - Resistant SSI and at bedtime coverage   CAD Hyperlipidemia Left heart cath 03/2022 that revealed 50% D2, 40% mid LAD, and 25% ramus lesions.  - atorvastatin  80 mg daily - Carvedilol  25 mg bid   Hypercoagulable, VTE Hx BL Vas US  c/w subQ edema and w/o evidence of DVT - Eliquis  5 mg BID   Normocytic anemia with iron deficiency Hgb 11.5 on admission. Gave IV Iron 11/5, remains stable.   OSA suspected Patient awaiting sleep studies.  - CPAP at night   Asthma - PRN albuterol    GERD - pantoprazole  40 mg daily  Resolved Problems:  __________________________________  Code Status: Full VTE Prophylaxis: Eliquis  5mg  BID Diet:Renal diet, n.p.o. at midnight IVF:N/A Barriers to Discharge:Evaluation of renal insufficiency and RHC Dispo: Anticipated discharge in approximately 3-5 day(s).   Kandis Perkins, DO 08/11/2024, 11:40 AM Please contact the on call pager at: [phone]

## 2024-08-11 NOTE — Plan of Care (Signed)
   Problem: Education: Goal: Ability to describe self-care measures that may prevent or decrease complications (Diabetes Survival Skills Education) will improve Outcome: Progressing Goal: Individualized Educational Video(s) Outcome: Progressing   Problem: Coping: Goal: Ability to adjust to condition or change in health will improve Outcome: Progressing

## 2024-08-12 DIAGNOSIS — N185 Chronic kidney disease, stage 5: Secondary | ICD-10-CM | POA: Diagnosis not present

## 2024-08-12 DIAGNOSIS — I5033 Acute on chronic diastolic (congestive) heart failure: Secondary | ICD-10-CM | POA: Diagnosis not present

## 2024-08-12 LAB — MAGNESIUM: Magnesium: 2.2 mg/dL (ref 1.7–2.4)

## 2024-08-12 LAB — RENAL FUNCTION PANEL
Albumin: 2.8 g/dL — ABNORMAL LOW (ref 3.5–5.0)
Anion gap: 15 (ref 5–15)
BUN: 80 mg/dL — ABNORMAL HIGH (ref 6–20)
CO2: 22 mmol/L (ref 22–32)
Calcium: 8.4 mg/dL — ABNORMAL LOW (ref 8.9–10.3)
Chloride: 100 mmol/L (ref 98–111)
Creatinine, Ser: 5.09 mg/dL — ABNORMAL HIGH (ref 0.61–1.24)
GFR, Estimated: 13 mL/min — ABNORMAL LOW (ref >60)
Glucose, Bld: 242 mg/dL — ABNORMAL HIGH (ref 70–99)
Phosphorus: 5.4 mg/dL — ABNORMAL HIGH (ref 2.5–4.6)
Potassium: 3.8 mmol/L (ref 3.5–5.1)
Sodium: 137 mmol/L (ref 135–145)

## 2024-08-12 LAB — GLUCOSE, CAPILLARY
Glucose-Capillary: 191 mg/dL — ABNORMAL HIGH (ref 70–99)
Glucose-Capillary: 173 mg/dL — ABNORMAL HIGH (ref 70–99)
Glucose-Capillary: 178 mg/dL — ABNORMAL HIGH (ref 70–99)
Glucose-Capillary: 208 mg/dL — ABNORMAL HIGH (ref 70–99)
Glucose-Capillary: 215 mg/dL — ABNORMAL HIGH (ref 70–99)

## 2024-08-12 LAB — PARATHYROID HORMONE, INTACT (NO CA): PTH: 112 pg/mL — ABNORMAL HIGH (ref 15–65)

## 2024-08-12 MED ORDER — INSULIN GLARGINE-YFGN 100 UNIT/ML ~~LOC~~ SOLN
14.0000 [IU] | Freq: Every day | SUBCUTANEOUS | Status: DC
  Administered 2024-08-12: 14 [IU] via SUBCUTANEOUS
  Filled 2024-08-12: qty 0.14

## 2024-08-12 MED ORDER — FUROSEMIDE 10 MG/ML IJ SOLN
40.0000 mg | Freq: Two times a day (BID) | INTRAMUSCULAR | Status: DC
  Administered 2024-08-12 – 2024-08-13 (×2): 40 mg via INTRAVENOUS
  Filled 2024-08-12 (×2): qty 4

## 2024-08-12 MED ORDER — INSULIN GLARGINE-YFGN 100 UNIT/ML ~~LOC~~ SOLN
28.0000 [IU] | Freq: Every day | SUBCUTANEOUS | Status: DC
  Filled 2024-08-12: qty 0.28

## 2024-08-12 NOTE — Progress Notes (Signed)
 [facility] NEPHROLOGY PROGRESS NOTE  Assessment/ Plan: Pt is a 53 y.o. yo male with past medical history significant for hypertension, type 2 diabetes, PE, CKD 4, CHF who was admitted for acute on chronic CHF and AKI.    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  Acute change in GFR likely hemodynamically mediated caused by Entresto, diuretics.  He is clinically improving with diuretics however both BUN and creatinine level are trending up.  The volume status seems to be improving therefore I am lowering Lasix  to 40 mg IV twice a day (was on 120mg  q8hr IV).  Monitor urine output and lab results.  Patient understand that he may need dialysis if no improvement in his renal function.  Fortunately, he has no uremic symptoms and no urgent need for dialysis at this time.  # HFpEF LVEF 60%: Treating with diuretics, adjustment of dose as above.  Holding [place] because of AKI.  Plan for right heart cath by cardiologist tomorrow.  # CKD stage IV: Thought to be due to diabetic nephropathy and was seen by [facility] in the past and then lost to follow-up.  Reportedly the baseline creatinine level is around 3.  #Hypertension: Monitor BP with diuresis.  # CKD-MBD/secondary hyperparathyroidism: PTH level 112 acceptable for CKD, monitor ca, phos.   #Normocytic Anemia -TSAT 12% F116 -> already given Venofer 500mg . Hb improved.   Subjective: Seen and examined at the bedside.  Patient reported that his breathing is much better.  Denies nausea, vomiting.  No shortness of breath.  He is on room air and able to lie flat.  Reports anxious about low kidney function. Objective Vital signs in last 24 hours: Vitals:   08/11/24 1530 08/11/24 2317 08/12/24 0434 08/12/24 0754  BP: 115/70 (!) 141/82 139/86 (!) 142/92  Pulse: 76 77 77 78  Resp: 18 16 18 14   Temp: 97.8 F (36.6 C) 97.9 F (36.6 C) 97.7 F (36.5 C) (!) 97.3 F (36.3 C)  TempSrc: Oral Oral Oral Axillary  SpO2: 97% 99% 99% 97%  Weight:    107.1 kg   Height:       Weight change:   Intake/Output Summary (Last 24 hours) at 08/12/2024 1148 Last data filed at 08/12/2024 0854 Gross per 24 hour  Intake 1458 ml  Output 2300 ml  Net -842 ml       Labs: RENAL PANEL Recent Labs  Lab 08/07/24 0322 08/07/24 1115 08/07/24 2007 08/08/24 0559 08/09/24 0308 08/09/24 1647 08/10/24 0700 08/11/24 0221 08/12/24 0232  NA 142   < > 140 141 136 137 137 138 137  K 3.7   < > 4.1 3.8 3.5 3.9 3.7 3.6 3.8  CL 106   < > 104 103 101 103 102 100 100  CO2 24   < > 24 24 21* 22 21* 23 22  GLUCOSE 126*   < > 193* 128* 235* 164* 249* 175* 242*  BUN 48*   < > 50* 50* 57* 60* 64* 72* 80*  CREATININE 3.53*   < > 3.95* 3.88* 4.41* 4.78* 4.95* 4.73* 5.09*  CALCIUM  8.3*   < > 8.3* 8.4* 8.3* 8.2* 8.2* 8.4* 8.4*  MG 2.1  --   --  2.1 2.0  --   --  2.2 2.2  PHOS  --   --  3.8  --  4.6  --  5.0* 5.1* 5.4*  ALBUMIN   --   --  2.4*  --  2.6*  --  2.7* 2.8*  2.8*   < > = values in this interval not displayed.    Liver Function Tests: Recent Labs  Lab 08/06/24 0739 08/06/24 1546 08/10/24 0700 08/11/24 0221 08/12/24 0232  AST 22  --   --   --   --   ALT 26  --   --   --   --   ALKPHOS 133*  --   --   --   --   BILITOT 0.8  --   --   --   --   PROT 6.5  --   --   --   --   ALBUMIN  2.6*   < > 2.7* 2.8* 2.8*   < > = values in this interval not displayed.   No results for input(s): LIPASE, AMYLASE in the last 168 hours. No results for input(s): AMMONIA in the last 168 hours. CBC: Recent Labs    08/06/24 0146 08/07/24 0322 08/08/24 0559 08/09/24 0308 08/11/24 0221  HGB 11.5* 10.2* 10.6* 11.3* 12.0*  MCV 93.6 91.8 92.0 90.9 90.3  VITAMINB12  --  370  --   --   --   FERRITIN  --  116  --   --   --   TIBC  --  242*  --   --   --   IRON  --  28*  --   --   --     Cardiac Enzymes: No results for input(s): CKTOTAL, CKMB, CKMBINDEX, TROPONINI in the last 168 hours. CBG: Recent Labs  Lab 08/11/24 0603 08/11/24 1059  08/11/24 1530 08/11/24 2125 08/12/24 0623  GLUCAP 149* 182* 166* 242* 191*    Iron Studies: No results for input(s): IRON, TIBC, TRANSFERRIN, FERRITIN in the last 72 hours. Studies/Results: No results found.  Medications: Infusions:  furosemide  120 mg (08/12/24 0748)    Scheduled Medications:  apixaban   5 mg Oral BID   atorvastatin   80 mg Oral QHS   carvedilol   25 mg Oral BID WC   dorzolamide -timolol   1 drop Both Eyes BID   guaiFENesin   600 mg Oral BID   hydrALAZINE   10 mg Oral Q8H   insulin  aspart  0-20 Units Subcutaneous TID WC   insulin  aspart  0-5 Units Subcutaneous QHS   insulin  aspart  6 Units Subcutaneous TID WC   insulin  glargine-yfgn  28 Units Subcutaneous QHS   latanoprost   1 drop Both Eyes QHS   lidocaine   1 patch Transdermal Q24H   pantoprazole   40 mg Oral Daily   senna  1 tablet Oral BID    have reviewed scheduled and prn medications.  Physical Exam: General:NAD, comfortable, able to lie flat Heart:RRR, s1s2 nl Lungs:clear b/l, no crackle Abdomen:soft, Non-tender, non-distended Extremities:No peripheral edema Neurology: Alert awake and following commands  Lilygrace Rodick Prasad Shalaina Guardiola 08/12/2024,11:48 AM  LOS: 5 days

## 2024-08-12 NOTE — H&P (View-Only) (Signed)
 Progress Note  Patient Name: Leroy Simmons Date of Encounter: 08/12/2024 Primary Cardiologist: Soyla DELENA Merck, MD   Subjective   Overnight planned for RHC  but it has not been ordered. Patient notes that he feels well.  Still having some shortness of breath.  Creatinine has never been 5 before.  Vital Signs    Vitals:   08/11/24 1530 08/11/24 2317 08/12/24 0434 08/12/24 0754  BP: 115/70 (!) 141/82 139/86 (!) 142/92  Pulse: 76 77 77 78  Resp: 18 16 18 14   Temp: 97.8 F (36.6 C) 97.9 F (36.6 C) 97.7 F (36.5 C) (!) 97.3 F (36.3 C)  TempSrc: Oral Oral Oral Axillary  SpO2: 97% 99% 99% 97%  Weight:   107.1 kg   Height:        Intake/Output Summary (Last 24 hours) at 08/12/2024 0850 Last data filed at 08/12/2024 0700 Gross per 24 hour  Intake 1100 ml  Output 3000 ml  Net -1900 ml   Filed Weights   08/10/24 0133 08/11/24 0843 08/12/24 0434  Weight: 110.5 kg 107.9 kg 107.1 kg    Physical Exam   GEN: No acute distress.   Neck: No JVD sitting up Cardiac: RRR, no murmurs, rubs, or gallops.  Respiratory: Clear to auscultation bilaterally. GI: Soft, nontender, non-distended  MS: +1 bilateral edema  Labs  EKG: SR with LVH and artifact- 08/10/24, personally reveiwed Telemetry: SR   Chemistry Recent Labs  Lab 08/06/24 0739 08/06/24 1546 08/10/24 0700 08/11/24 0221 08/12/24 0232  NA  --    < > 137 138 137  K  --    < > 3.7 3.6 3.8  CL  --    < > 102 100 100  CO2  --    < > 21* 23 22  GLUCOSE  --    < > 249* 175* 242*  BUN  --    < > 64* 72* 80*  CREATININE  --    < > 4.95* 4.73* 5.09*  CALCIUM   --    < > 8.2* 8.4* 8.4*  PROT 6.5  --   --   --   --   ALBUMIN  2.6*   < > 2.7* 2.8* 2.8*  AST 22  --   --   --   --   ALT 26  --   --   --   --   ALKPHOS 133*  --   --   --   --   BILITOT 0.8  --   --   --   --   GFRNONAA  --    < > 13* 14* 13*  ANIONGAP  --    < > 14 15 15    < > = values in this interval not displayed.     Hematology Recent Labs   Lab 08/08/24 0559 08/09/24 0308 08/11/24 0221  WBC 8.7 8.4 8.2  RBC 3.50* 3.61* 3.91*  HGB 10.6* 11.3* 12.0*  HCT 32.2* 32.8* 35.3*  MCV 92.0 90.9 90.3  MCH 30.3 31.3 30.7  MCHC 32.9 34.5 34.0  RDW 11.7 11.5 11.5  PLT 266 335 372     BNP Recent Labs  Lab 08/06/24 0146  BNP 241.1*       Cardiac Studies   Cardiac Studies & Procedures   ______________________________________________________________________________________________ CARDIAC CATHETERIZATION  CARDIAC CATHETERIZATION 03/21/2022  Conclusion   2nd Diag lesion is 50% stenosed.   Mid LAD lesion is 40% stenosed.   Ramus lesion is 25% stenosed.  LV end diastolic pressure is severely elevated.  LVEDP 33 mm Hg.   There is no aortic valve stenosis.  Nonobstructive CAD. Volume overload. Continue diuresis.  Findings Coronary Findings Diagnostic  Dominance: Right  Left Anterior Descending The vessel exhibits minimal luminal irregularities. Mid LAD lesion is 40% stenosed.  Second Diagonal Branch 2nd Diag lesion is 50% stenosed.  Ramus Intermedius Ramus lesion is 25% stenosed.  Intervention  No interventions have been documented.     ECHOCARDIOGRAM  ECHOCARDIOGRAM COMPLETE 08/06/2024  Narrative ECHOCARDIOGRAM REPORT    Patient Name:   Leroy Simmons Date of Exam: 08/06/2024 Medical Rec #:  [id number]      Height:       68.0 in Accession #:    [id number]     Weight:       245.0 lb Date of Birth:  11/29/1970      BSA:          2.228 m Patient Age:    52 years       BP:           158/102 mmHg Patient Gender: M              HR:           83 bpm. Exam Location:  Inpatient  Procedure: 2D Echo (Both Spectral and Color Flow Doppler were utilized during procedure).  STAT ECHO  Indications:    Dyspnea R06.00  History:        Patient has prior history of Echocardiogram examinations, most recent 01/10/2024. Cardiomegaly, CAD, Pulmonary HTN, Signs/Symptoms:Chest Pain and Dyspnea;  Risk Factors:Hypertension, Diabetes and Hyperlipidemia.  Sonographer:    BERNARDA ROCKS Referring Phys: [id number] HAYLEY N NAASZ  IMPRESSIONS   1. Left ventricular ejection fraction, by estimation, is 60 to 65%. The left ventricle has normal function. The left ventricle has no regional wall motion abnormalities. Indeterminate diastolic filling due to E-A fusion. 2. Right ventricular systolic function is normal. The right ventricular size is normal. Tricuspid regurgitation signal is inadequate for assessing PA pressure. 3. Left atrial size was moderately dilated. 4. The mitral valve is normal in structure. No evidence of mitral valve regurgitation. No evidence of mitral stenosis. 5. The aortic valve was not well visualized. Aortic valve regurgitation is not visualized. No aortic stenosis is present. 6. The inferior vena cava is normal in size with greater than 50% respiratory variability, suggesting right atrial pressure of 3 mmHg.  Comparison(s): No significant change from prior study. Prior images reviewed side by side.  FINDINGS Left Ventricle: Left ventricular ejection fraction, by estimation, is 60 to 65%. The left ventricle has normal function. The left ventricle has no regional wall motion abnormalities. The left ventricular internal cavity size was normal in size. There is no left ventricular hypertrophy. Indeterminate diastolic filling due to E-A fusion.  Right Ventricle: The right ventricular size is normal. No increase in right ventricular wall thickness. Right ventricular systolic function is normal. Tricuspid regurgitation signal is inadequate for assessing PA pressure.  Left Atrium: Left atrial size was moderately dilated.  Right Atrium: Right atrial size was normal in size.  Pericardium: There is no evidence of pericardial effusion.  Mitral Valve: The mitral valve is normal in structure. No evidence of mitral valve regurgitation. No evidence of mitral valve stenosis. MV peak  gradient, 9.5 mmHg. The mean mitral valve gradient is 3.0 mmHg.  Tricuspid Valve: The tricuspid valve is normal in structure. Tricuspid valve regurgitation is not demonstrated. No evidence of tricuspid  stenosis.  Aortic Valve: The aortic valve was not well visualized. Aortic valve regurgitation is not visualized. No aortic stenosis is present. Aortic valve mean gradient measures 3.0 mmHg. Aortic valve peak gradient measures 6.8 mmHg. Aortic valve area, by VTI measures 4.35 cm.  Pulmonic Valve: The pulmonic valve was normal in structure. Pulmonic valve regurgitation is not visualized. No evidence of pulmonic stenosis.  Aorta: The aortic root and ascending aorta are structurally normal, with no evidence of dilitation.  Venous: The inferior vena cava is normal in size with greater than 50% respiratory variability, suggesting right atrial pressure of 3 mmHg.  IAS/Shunts: No atrial level shunt detected by color flow Doppler.   LEFT VENTRICLE PLAX 2D LVIDd:         5.50 cm      Diastology LVIDs:         3.30 cm      LV e' medial:    6.74 cm/s LV PW:         0.90 cm      LV E/e' medial:  22.0 LV IVS:        1.00 cm      LV e' lateral:   9.03 cm/s LVOT diam:     2.30 cm      LV E/e' lateral: 16.4 LV SV:         92 LV SV Index:   41 LVOT Area:     4.15 cm  LV Volumes (MOD) LV vol d, MOD A2C: 173.0 ml LV vol d, MOD A4C: 183.0 ml LV vol s, MOD A2C: 57.9 ml LV vol s, MOD A4C: 80.0 ml LV SV MOD A2C:     115.1 ml LV SV MOD A4C:     183.0 ml LV SV MOD BP:      115.6 ml  RIGHT VENTRICLE RV Basal diam:  3.60 cm RV S prime:     12.60 cm/s TAPSE (M-mode): 2.1 cm  LEFT ATRIUM              Index        RIGHT ATRIUM           Index LA diam:        5.00 cm  2.24 cm/m   RA Area:     13.70 cm LA Vol (A2C):   82.4 ml  36.99 ml/m  RA Volume:   27.50 ml  12.34 ml/m LA Vol (A4C):   109.0 ml 48.93 ml/m LA Biplane Vol: 97.2 ml  43.63 ml/m AORTIC VALVE                    PULMONIC VALVE AV Area  (Vmax):    4.22 cm     PV Vmax:          1.01 m/s AV Area (Vmean):   3.57 cm     PV Peak grad:     4.0 mmHg AV Area (VTI):     4.35 cm     PR End Diast Vel: 2.93 msec AV Vmax:           130.00 cm/s AV Vmean:          82.700 cm/s AV VTI:            0.211 m AV Peak Grad:      6.8 mmHg AV Mean Grad:      3.0 mmHg LVOT Vmax:         132.00 cm/s LVOT Vmean:  71.000 cm/s LVOT VTI:          0.221 m LVOT/AV VTI ratio: 1.05  AORTA Ao Root diam: 3.40 cm Ao Asc diam:  3.60 cm  MITRAL VALVE MV Area (PHT): 7.74 cm     SHUNTS MV Area VTI:   3.35 cm     Systemic VTI:  0.22 m MV Peak grad:  9.5 mmHg     Systemic Diam: 2.30 cm MV Mean grad:  3.0 mmHg MV Vmax:       1.54 m/s MV Vmean:      77.6 cm/s MV Decel Time: 98 msec MV E velocity: 148.00 cm/s MV A velocity: 109.00 cm/s MV E/A ratio:  1.36  Stanly Leavens MD Electronically signed by Stanly Leavens MD Signature Date/Time: 08/06/2024/12:04:35 PM    Final      CT SCANS  CT CORONARY MORPH W/CTA COR W/SCORE 03/18/2022  Addendum 03/18/2022 11:37 AM ADDENDUM REPORT: 03/18/2022 11:35  HISTORY: 53 yo male with chest pain/anginal equiv, intermediate CAD risk, not treadmill candidate  EXAM: Cardiac/Coronary CTA  TECHNIQUE: The patient was scanned on a [employer].  PROTOCOL: A 120 kV prospective scan was triggered in the descending thoracic aorta at 111 HU's. Axial non-contrast 3 mm slices were carried out through the heart. The data set was analyzed on a dedicated work station and scored using the Agatson method. Gantry rotation speed was 250 msecs and collimation was .6 mm. Beta blockade and 0.8 mg of sl NTG was given. The 3D data set was reconstructed in 5% intervals of the 35-75 % of the R-R cycle. Diastolic phases were analyzed on a dedicated work station using MPR, MIP and VRT modes. The patient received 80mL OMNIPAQUE  IOHEXOL  350 MG/ML SOLN of contrast.  FINDINGS: Quality: Good, HR  68  Coronary calcium  score: The patient's coronary artery calcium  score is 431, which places the patient in the 98th percentile.  Coronary arteries: Normal coronary origins.  Right dominance.  Right Coronary Artery: Dominant. Mild mixed proximal 25-49% stenosis (CADRADS1). There is plaque at the crux with positive remodeling and spotty calcification. There is minimal 1-24% mixed distal vessel stenosis.  Left Main Coronary Artery: Normal. Bifurcates into the LAD and LCx arteries.  Left Anterior Descending Coronary Artery: Anterior artery which wraps around the apex. There is eccentric ostial calcification and spotty proximal calcification with associated low attenuation plaque and 70-99% stenoisis (CADRADS4a) - the LAD measures 1.02 mm at its smallest point. The proximal segment of atherosclerosis extends 50 mm from the LAD/LCX bifurcation. D1 branch has minimal mixed proximal disease.  Left Circumflex Artery: Lateral wall vessel. There is a mild 25-49% non-calcified stenosis of the proximal vessel (CADRADS2).  Aorta: Normal size, 32 mm at the mid ascending aorta (level of the PA bifurcation) measured double oblique. No calcifications. No dissection.  Aortic Valve: Trileaflet. No calcifications.  Other findings:  Normal pulmonary vein drainage into the left atrium.  Normal left atrial appendage without a thrombus.  Normal size of the pulmonary artery.  IMPRESSION: 1. 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 CT FFR will be performed and reported separately.  2. Coronary calcium  score of 431. This was 98th percentile for age and sex matched control.  3. Normal coronary origin with right dominance.  4. Definitive cardiac catheterization is recommended.   Electronically Signed By: Vinie JAYSON Maxcy M.D. On: 03/18/2022 11:35  Narrative EXAM: OVER-READ INTERPRETATION  CT CHEST  The following report is a limited chest CT  over-read performed by radiologist Dr. Toribio Aye of [facility] on 03/18/2022. The coronary calcium  score and cardiac CTA interpretation by the cardiologist is attached.  COMPARISON:  None Available.  FINDINGS: Small left lower lobe pulmonary nodule measuring 8 mm abutting the left hemidiaphragm (axial image 42 of series 11). Within the visualized portions of the thorax there is no acute consolidative airspace disease, no pleural effusions, no pneumothorax and no lymphadenopathy. Visualized portions of the upper abdomen are unremarkable. There are no aggressive appearing lytic or blastic lesions noted in the visualized portions of the skeleton.  IMPRESSION: 1. 8 mm left lower lobe pulmonary nodule. Non-contrast chest CT at 6-12 months is recommended. If the nodule is stable at time of repeat CT, then future CT at 18-24 months (from today's scan) is considered optional for low-risk patients, but is recommended for high-risk patients. This recommendation follows the consensus statement: Guidelines for Management of Incidental Pulmonary Nodules Detected on CT Images: From the Fleischner Society 2017; Radiology 2017; 284:228-243.  Electronically Signed: By: Toribio Aye M.D. On: 03/18/2022 11:03   CT SCANS  CT CORONARY FRACTIONAL FLOW RESERVE DATA PREP 03/18/2022  Narrative EXAM: CT FFR ANALYSIS  CLINICAL DATA:  angina  FINDINGS: FFRct analysis was performed on the original cardiac CT angiogram dataset. Diagrammatic representation of the FFRct analysis is provided in a separate PDF document in PACS. This dictation was created using the PDF document and an interactive 3D model of the results. 3D model is not available in the EMR/PACS. Normal FFR range is >0.80.  1. Left Main:  No significant stenosis. FFR = 1.00  2. LAD: Significant stenosis. Proximal FFR = 0.97, Mid FFR = 0.76, Distal FFR = 0.61 3. LCX: No significant stenosis. Proximal FFR =  0.94, Distal FFR = 0.81 4. RCA: No significant stenosis. Proximal FFR =0.99, Mid FFR = 0.93, Distal FFR = 0.87  IMPRESSION: 1.  CT FFR does show significant proximal to mid LAD stenosis.  2.  Definitive cardiac catheterization is recommended.   Electronically Signed By: Vinie JAYSON Maxcy M.D. 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  He does not appears to be euvolemic.  For this reason, I recommend confirming this with RHC as it may lead to decisions about HD.  He is amenable and this is scheduled for 08/13/24  Risks and benefits of cardiac catheterization have been discussed with the patient.  These include bleeding, infection, kidney damage, stroke, heart attack, death.  The patient understands these risks and is willing to proceed.  Access recommendations: R AC Procedural considerations: Do not suspect he will need leave in swan   Non obstructive CAD HLD - on atorvastatin , non plavix given PCP has him  on [facility]  Morbid obesity - GLP1RA health this admission  As per primary or other consultants - AKI on CKD IV - T2DM - PE/DVT on AC - OSA - Asthma - GERD   For  questions or updates, please contact [facility] Please consult [email or web address] for contact info under Cardiology/STEMI.      Stanly Leavens, MD FASE [facility] Cardiologist [facility]  [address] [place], [address] [place], [place] [address] [phone]  8:50 AM

## 2024-08-12 NOTE — Progress Notes (Signed)
 Mobility Specialist: Progress Note   08/12/24 1232  Mobility  Activity Ambulated with assistance  Level of Assistance Standby assist, set-up cues, supervision of patient - no hands on  Assistive Device Front wheel walker  Distance Ambulated (ft) 400 ft  Activity Response Tolerated well  Mobility Referral Yes  Mobility visit 1 Mobility  Mobility Specialist Start Time (ACUTE ONLY) 1220  Mobility Specialist Stop Time (ACUTE ONLY) 1230  Mobility Specialist Time Calculation (min) (ACUTE ONLY) 10 min    Pt received sitting EOB, agreeable to mobility session. SV throughout. Physically feeling a little better and stated his breathing has improved. Focused on his posture with RW during session and did not need any cues or correction. No complaints. Returned to room. Left on EOB with all needs met, call bell in reach. Partner and RN in room.   Ileana Lute Mobility Specialist Please contact via SecureChat or Rehab office at [phone]

## 2024-08-12 NOTE — Progress Notes (Signed)
 HD#5 SUBJECTIVE:  Patient Summary: Leroy Simmons is a 53 y.o. with a pertinent PMH of HFpEF, PE 2025, CAD, asthma, T2DM, and CKD4 who presented with SOB and admitted for acute on chronic heart failure exacerbation.   Overnight Events: none  Interim History: He slept well, is a breathing ok, still has some nasal congestion but improving. He feels like his SOB when walking is getting better. Denies dysuria or itchiness. He does feel better than when he came in. He learned a lot from speaking with the dietician and is trying to make better choices with his food.   OBJECTIVE:  Vital Signs: Vitals:   08/11/24 1100 08/11/24 1530 08/11/24 2317 08/12/24 0434  BP: (!) 142/92 115/70 (!) 141/82 139/86  Pulse: 78 76 77 77  Resp: 17 18 16 18   Temp: 98 F (36.7 C) 97.8 F (36.6 C) 97.9 F (36.6 C) 97.7 F (36.5 C)  TempSrc: Oral Oral Oral Oral  SpO2: 99% 97% 99% 99%  Weight:    107.1 kg  Height:       Supplemental O2: Room Air SpO2: 99 % O2 Flow Rate (L/min): 2 L/min  Filed Weights   08/10/24 0133 08/11/24 0843 08/12/24 0434  Weight: 110.5 kg 107.9 kg 107.1 kg     Intake/Output Summary (Last 24 hours) at 08/12/2024 0528 Last data filed at 08/12/2024 0433 Gross per 24 hour  Intake 1220 ml  Output 3150 ml  Net -1930 ml   Net IO Since Admission: -8,215.85 mL [08/12/24 0528]  Physical Exam: Physical Exam Vitals reviewed.  Constitutional:      Appearance: He is not ill-appearing.  Eyes:     Extraocular Movements: Extraocular movements intact.  Cardiovascular:     Rate and Rhythm: Normal rate and regular rhythm.  Pulmonary:     Effort: Pulmonary effort is normal.     Breath sounds: Normal breath sounds.  Musculoskeletal:     Right lower leg: No tenderness. Edema present.     Left lower leg: No tenderness. Edema present.     Comments: BL unna boots on  + pitting edema but improving  Skin:    General: Skin is warm.  Neurological:     General: No focal deficit present.      Mental Status: He is alert and oriented to person, place, and time.  Psychiatric:        Mood and Affect: Mood normal.        Behavior: Behavior normal.     Patient Lines/Drains/Airways Status     Active Line/Drains/Airways     Name Placement date Placement time Site Days   Peripheral IV 08/06/24 20 G Left Antecubital 08/06/24  0736  Antecubital  6            Pertinent labs and imaging:  Mg 2.2    Latest Ref Rng & Units 08/11/2024    2:21 AM 08/09/2024    3:08 AM 08/08/2024    5:59 AM  CBC  WBC 4.0 - 10.5 K/uL 8.2  8.4  8.7   Hemoglobin 13.0 - 17.0 g/dL 87.9  88.6  89.3   Hematocrit 39.0 - 52.0 % 35.3  32.8  32.2   Platelets 150 - 400 K/uL 372  335  266        Latest Ref Rng & Units 08/12/2024    2:32 AM 08/11/2024    2:21 AM 08/10/2024    7:00 AM  CMP  Glucose 70 - 99 mg/dL 757  824  249   BUN 6 - 20 mg/dL 80  72  64   Creatinine 0.61 - 1.24 mg/dL 4.90  5.26  5.04   Sodium 135 - 145 mmol/L 137  138  137   Potassium 3.5 - 5.1 mmol/L 3.8  3.6  3.7   Chloride 98 - 111 mmol/L 100  100  102   CO2 22 - 32 mmol/L 22  23  21    Calcium  8.9 - 10.3 mg/dL 8.4  8.4  8.2     No results found.  ASSESSMENT/PLAN:  Assessment: Principal Problem:   Acute on chronic diastolic heart failure with preserved ejection fraction (HCC) Active Problems:   Uncontrolled type 2 diabetes mellitus with hyperglycemia, with long-term current use of insulin  (HCC)   Anemia of chronic disease   History of DVT (deep vein thrombosis), LLE 2024   Chronic kidney disease (CKD), stage IV (severe) (HCC)   History of pulmonary embolism, LLL 01/2024   Peripheral edema   Plan: Acute on chronic diastolic heart failure exacerbation, LVEF 60-65% 08/2024 NYHA Class III Stage C  UOP recorded at 1930 mL in the past 24 hours, down 9lbs since admission. Transition to IV Lasix  40 mg BID. Cards taking for RHC tomorrow to confirm etiology of hypervolemia  -IV Lasix  40 mg BID -Cardiology consulted,  appreciate recommendations  -Magnesium  is 2.2, no replacement required today  -GDMT: Coreg  25 mg BID, will defer to nephrology for possible Ace/ARB in the future    CKD4 Nephrotic Range Proteinuria Baseline Cr ~3. Cr increase to 5.09 this AM. Suspect creatinine elevation is due to recent entresto use and possibly from high dose diuretics. Does not have uremic symptoms and no need for urgent dialysis although patients aware that he will likely need dialysis in the future.  Plan:  -IV Lasix  40 mg BID per nephrology - renal diet with salt and fluid restriction - Unna boots, encourage elevation    Hypertension  BP remains elevated at 139/86 this morning.  Plan: -Hydralazine  10 mg TID, carvedilol  25 mg BID   Diabetes mellitus on chronic insulin  use A1c this admission 7.2. FBG this AM 242.  - Will decrease patient's Semglee  to 14 units tonight due to n.p.o. status at midnight - 6 units TID with meals - Resistant SSI and at bedtime coverage  Renal Osteodystrophy Secondary Hyperparathyroidism PTH 112, vitamin D 25 15.30. Continue to monitor Ca and phos.  -RFP in AM   CAD Hyperlipidemia Left heart cath 03/2022 that revealed 50% D2, 40% mid LAD, and 25% ramus lesions.  - atorvastatin  80 mg daily - Carvedilol  25 mg bid   Hypercoagulable, VTE Hx BL Vas US  c/w subQ edema and w/o evidence of DVT - Eliquis  5 mg BID   Normocytic anemia with iron deficiency Hgb 11.5 on admission. Gave IV Iron 11/5, remains stable.    OSA suspected Patient awaiting sleep studies.  - CPAP at night   Asthma - PRN albuterol    GERD - pantoprazole  40 mg daily  Best Practice: Diet: Renal diet, NPO at midnight IVF: Fluids: none, Rate: None VTE: SCDs Start: 08/06/24 1014 Code: Full  Disposition planning: Therapy Recs: None, DME: none DISPO: Anticipated discharge in 2-3 days to Home pending medical mgmt.  Signature:  Viktoria Charmayne [facility]  5:28 AM, 08/12/2024  On  Call pager [phone]

## 2024-08-12 NOTE — Progress Notes (Signed)
   08/12/24 9061  Spiritual Encounters  Type of Visit Initial  Care provided to: Patient  Referral source Clinical staff  Reason for visit Routine spiritual support  OnCall Visit No  Spiritual Framework  Presenting Themes Impactful experiences and emotions  Community/Connection None  Patient Stress Factors Exhausted  Family Stress Factors None identified  Interventions  Spiritual Care Interventions Made Compassionate presence;Established relationship of care and support  Intervention Outcomes  Outcomes Awareness of health;Awareness of support   Chaplain visited Pt who expressed feeling stressed after being informed by the medical staff that he may need dialysis due to kidney concerns. Chaplain provided active listening as the Pt reflected on what the medical shared.  During, the visit, the physician entered the room, and the chaplain respectfully stepped out to allow the medical team to continue care. Chaplain informed the Pt that chaplain services remain available for continued spiritual and emotional support as needed.

## 2024-08-12 NOTE — Plan of Care (Signed)
  Problem: Education: Goal: Ability to describe self-care measures that may prevent or decrease complications (Diabetes Survival Skills Education) will improve Outcome: Progressing   Problem: Fluid Volume: Goal: Ability to maintain a balanced intake and output will improve Outcome: Progressing   Problem: Education: Goal: Knowledge of General Education information will improve Description: Including pain rating scale, medication(s)/side effects and non-pharmacologic comfort measures Outcome: Progressing   Problem: Activity: Goal: Risk for activity intolerance will decrease Outcome: Progressing

## 2024-08-12 NOTE — Plan of Care (Signed)
   Problem: Fluid Volume: Goal: Ability to maintain a balanced intake and output will improve Outcome: Progressing   Problem: Health Behavior/Discharge Planning: Goal: Ability to manage health-related needs will improve Outcome: Progressing

## 2024-08-12 NOTE — Progress Notes (Signed)
 Orthopedic Tech Progress Note Patient Details:  Leroy Simmons September 25, 1971 [id number]  Ortho Devices Type of Ortho Device: Ace wrap, Unna boot Ortho Device/Splint Location: BLE Ortho Device/Splint Interventions: Removal, Ordered, Application   Post Interventions Patient Tolerated: Well Instructions Provided: Care of device  [name] Pac 08/12/2024, 4:47 PM

## 2024-08-12 NOTE — Progress Notes (Signed)
 Progress Note  Patient Name: Leroy Simmons Date of Encounter: 08/12/2024 Primary Cardiologist: Soyla DELENA Merck, MD   Subjective   Overnight planned for [facility]  but it has not been ordered. Patient notes that he feels well.  Still having some shortness of breath.  Creatinine has never been 5 before.  Vital Signs    Vitals:   08/11/24 1530 08/11/24 2317 08/12/24 0434 08/12/24 0754  BP: 115/70 (!) 141/82 139/86 (!) 142/92  Pulse: 76 77 77 78  Resp: 18 16 18 14   Temp: 97.8 F (36.6 C) 97.9 F (36.6 C) 97.7 F (36.5 C) (!) 97.3 F (36.3 C)  TempSrc: Oral Oral Oral Axillary  SpO2: 97% 99% 99% 97%  Weight:   107.1 kg   Height:        Intake/Output Summary (Last 24 hours) at 08/12/2024 0850 Last data filed at 08/12/2024 0700 Gross per 24 hour  Intake 1100 ml  Output 3000 ml  Net -1900 ml   Filed Weights   08/10/24 0133 08/11/24 0843 08/12/24 0434  Weight: 110.5 kg 107.9 kg 107.1 kg    Physical Exam   GEN: No acute distress.   Neck: No JVD sitting up Cardiac: RRR, no murmurs, rubs, or gallops.  Respiratory: Clear to auscultation bilaterally. GI: Soft, nontender, non-distended  MS: +1 bilateral edema  Labs  EKG: SR with LVH and artifact- 08/10/24, personally reveiwed Telemetry: SR   Chemistry Recent Labs  Lab 08/06/24 0739 08/06/24 1546 08/10/24 0700 08/11/24 0221 08/12/24 0232  NA  --    < > 137 138 137  K  --    < > 3.7 3.6 3.8  CL  --    < > 102 100 100  CO2  --    < > 21* 23 22  GLUCOSE  --    < > 249* 175* 242*  BUN  --    < > 64* 72* 80*  CREATININE  --    < > 4.95* 4.73* 5.09*  CALCIUM   --    < > 8.2* 8.4* 8.4*  PROT 6.5  --   --   --   --   ALBUMIN  2.6*   < > 2.7* 2.8* 2.8*  AST 22  --   --   --   --   ALT 26  --   --   --   --   ALKPHOS 133*  --   --   --   --   BILITOT 0.8  --   --   --   --   GFRNONAA  --    < > 13* 14* 13*  ANIONGAP  --    < > 14 15 15    < > = values in this interval not displayed.     Hematology Recent Labs   Lab 08/08/24 0559 08/09/24 0308 08/11/24 0221  WBC 8.7 8.4 8.2  RBC 3.50* 3.61* 3.91*  HGB 10.6* 11.3* 12.0*  HCT 32.2* 32.8* 35.3*  MCV 92.0 90.9 90.3  MCH 30.3 31.3 30.7  MCHC 32.9 34.5 34.0  RDW 11.7 11.5 11.5  PLT 266 335 372     BNP Recent Labs  Lab 08/06/24 0146  BNP 241.1*       Cardiac Studies   Cardiac Studies & Procedures   ______________________________________________________________________________________________ CARDIAC CATHETERIZATION  CARDIAC CATHETERIZATION 03/21/2022  Conclusion   2nd Diag lesion is 50% stenosed.   Mid LAD lesion is 40% stenosed.   Ramus lesion is 25% stenosed.  LV end diastolic pressure is severely elevated.  LVEDP 33 mm Hg.   There is no aortic valve stenosis.  Nonobstructive CAD. Volume overload. Continue diuresis.  Findings Coronary Findings Diagnostic  Dominance: Right  Left Anterior Descending The vessel exhibits minimal luminal irregularities. Mid LAD lesion is 40% stenosed.  Second Diagonal Branch 2nd Diag lesion is 50% stenosed.  Ramus Intermedius Ramus lesion is 25% stenosed.  Intervention  No interventions have been documented.     ECHOCARDIOGRAM  ECHOCARDIOGRAM COMPLETE 08/06/2024  Narrative ECHOCARDIOGRAM REPORT    Patient Name:   Leroy Simmons Date of Exam: 08/06/2024 Medical Rec #:  [id number]      Height:       68.0 in Accession #:    [id number]     Weight:       245.0 lb Date of Birth:  11/29/1970      BSA:          2.228 m Patient Age:    53 years       BP:           158/102 mmHg Patient Gender: M              HR:           83 bpm. Exam Location:  Inpatient  Procedure: 2D Echo (Both Spectral and Color Flow Doppler were utilized during procedure).  STAT ECHO  Indications:    Dyspnea R06.00  History:        Patient has prior history of Echocardiogram examinations, most recent 01/10/2024. Cardiomegaly, CAD, Pulmonary HTN, Signs/Symptoms:Chest Pain and Dyspnea;  Risk Factors:Hypertension, Diabetes and Hyperlipidemia.  Sonographer:    BERNARDA ROCKS Referring Phys: [id number] HAYLEY N NAASZ  IMPRESSIONS   1. Left ventricular ejection fraction, by estimation, is 60 to 65%. The left ventricle has normal function. The left ventricle has no regional wall motion abnormalities. Indeterminate diastolic filling due to E-A fusion. 2. Right ventricular systolic function is normal. The right ventricular size is normal. Tricuspid regurgitation signal is inadequate for assessing PA pressure. 3. Left atrial size was moderately dilated. 4. The mitral valve is normal in structure. No evidence of mitral valve regurgitation. No evidence of mitral stenosis. 5. The aortic valve was not well visualized. Aortic valve regurgitation is not visualized. No aortic stenosis is present. 6. The inferior vena cava is normal in size with greater than 50% respiratory variability, suggesting right atrial pressure of 3 mmHg.  Comparison(s): No significant change from prior study. Prior images reviewed side by side.  FINDINGS Left Ventricle: Left ventricular ejection fraction, by estimation, is 60 to 65%. The left ventricle has normal function. The left ventricle has no regional wall motion abnormalities. The left ventricular internal cavity size was normal in size. There is no left ventricular hypertrophy. Indeterminate diastolic filling due to E-A fusion.  Right Ventricle: The right ventricular size is normal. No increase in right ventricular wall thickness. Right ventricular systolic function is normal. Tricuspid regurgitation signal is inadequate for assessing PA pressure.  Left Atrium: Left atrial size was moderately dilated.  Right Atrium: Right atrial size was normal in size.  Pericardium: There is no evidence of pericardial effusion.  Mitral Valve: The mitral valve is normal in structure. No evidence of mitral valve regurgitation. No evidence of mitral valve stenosis. MV peak  gradient, 9.5 mmHg. The mean mitral valve gradient is 3.0 mmHg.  Tricuspid Valve: The tricuspid valve is normal in structure. Tricuspid valve regurgitation is not demonstrated. No evidence of tricuspid  stenosis.  Aortic Valve: The aortic valve was not well visualized. Aortic valve regurgitation is not visualized. No aortic stenosis is present. Aortic valve mean gradient measures 3.0 mmHg. Aortic valve peak gradient measures 6.8 mmHg. Aortic valve area, by VTI measures 4.35 cm.  Pulmonic Valve: The pulmonic valve was normal in structure. Pulmonic valve regurgitation is not visualized. No evidence of pulmonic stenosis.  Aorta: The aortic root and ascending aorta are structurally normal, with no evidence of dilitation.  Venous: The inferior vena cava is normal in size with greater than 50% respiratory variability, suggesting right atrial pressure of 3 mmHg.  IAS/Shunts: No atrial level shunt detected by color flow Doppler.   LEFT VENTRICLE PLAX 2D LVIDd:         5.50 cm      Diastology LVIDs:         3.30 cm      LV e' medial:    6.74 cm/s LV PW:         0.90 cm      LV E/e' medial:  22.0 LV IVS:        1.00 cm      LV e' lateral:   9.03 cm/s LVOT diam:     2.30 cm      LV E/e' lateral: 16.4 LV SV:         92 LV SV Index:   41 LVOT Area:     4.15 cm  LV Volumes (MOD) LV vol d, MOD A2C: 173.0 ml LV vol d, MOD A4C: 183.0 ml LV vol s, MOD A2C: 57.9 ml LV vol s, MOD A4C: 80.0 ml LV SV MOD A2C:     115.1 ml LV SV MOD A4C:     183.0 ml LV SV MOD BP:      115.6 ml  RIGHT VENTRICLE RV Basal diam:  3.60 cm RV S prime:     12.60 cm/s TAPSE (M-mode): 2.1 cm  LEFT ATRIUM              Index        RIGHT ATRIUM           Index LA diam:        5.00 cm  2.24 cm/m   RA Area:     13.70 cm LA Vol (A2C):   82.4 ml  36.99 ml/m  RA Volume:   27.50 ml  12.34 ml/m LA Vol (A4C):   109.0 ml 48.93 ml/m LA Biplane Vol: 97.2 ml  43.63 ml/m AORTIC VALVE                    PULMONIC VALVE AV Area  (Vmax):    4.22 cm     PV Vmax:          1.01 m/s AV Area (Vmean):   3.57 cm     PV Peak grad:     4.0 mmHg AV Area (VTI):     4.35 cm     PR End Diast Vel: 2.93 msec AV Vmax:           130.00 cm/s AV Vmean:          82.700 cm/s AV VTI:            0.211 m AV Peak Grad:      6.8 mmHg AV Mean Grad:      3.0 mmHg LVOT Vmax:         132.00 cm/s LVOT Vmean:  71.000 cm/s LVOT VTI:          0.221 m LVOT/AV VTI ratio: 1.05  AORTA Ao Root diam: 3.40 cm Ao Asc diam:  3.60 cm  MITRAL VALVE MV Area (PHT): 7.74 cm     SHUNTS MV Area VTI:   3.35 cm     Systemic VTI:  0.22 m MV Peak grad:  9.5 mmHg     Systemic Diam: 2.30 cm MV Mean grad:  3.0 mmHg MV Vmax:       1.54 m/s MV Vmean:      77.6 cm/s MV Decel Time: 98 msec MV E velocity: 148.00 cm/s MV A velocity: 109.00 cm/s MV E/A ratio:  1.36  Stanly Leavens MD Electronically signed by Stanly Leavens MD Signature Date/Time: 08/06/2024/12:04:35 PM    Final      CT SCANS  CT CORONARY MORPH W/CTA COR W/SCORE 03/18/2022  Addendum 03/18/2022 11:37 AM ADDENDUM REPORT: 03/18/2022 11:35  HISTORY: 53 yo male with chest pain/anginal equiv, intermediate CAD risk, not treadmill candidate  EXAM: Cardiac/Coronary CTA  TECHNIQUE: The patient was scanned on a [employer].  PROTOCOL: A 120 kV prospective scan was triggered in the descending thoracic aorta at 111 HU's. Axial non-contrast 3 mm slices were carried out through the heart. The data set was analyzed on a dedicated work station and scored using the Agatson method. Gantry rotation speed was 250 msecs and collimation was .6 mm. Beta blockade and 0.8 mg of sl NTG was given. The 3D data set was reconstructed in 5% intervals of the 35-75 % of the R-R cycle. Diastolic phases were analyzed on a dedicated work station using MPR, MIP and VRT modes. The patient received 80mL OMNIPAQUE  IOHEXOL  350 MG/ML SOLN of contrast.  FINDINGS: Quality: Good, HR  68  Coronary calcium  score: The patient's coronary artery calcium  score is 431, which places the patient in the 98th percentile.  Coronary arteries: Normal coronary origins.  Right dominance.  Right Coronary Artery: Dominant. Mild mixed proximal 25-49% stenosis (CADRADS1). There is plaque at the crux with positive remodeling and spotty calcification. There is minimal 1-24% mixed distal vessel stenosis.  Left Main Coronary Artery: Normal. Bifurcates into the LAD and LCx arteries.  Left Anterior Descending Coronary Artery: Anterior artery which wraps around the apex. There is eccentric ostial calcification and spotty proximal calcification with associated low attenuation plaque and 70-99% stenoisis (CADRADS4a) - the LAD measures 1.02 mm at its smallest point. The proximal segment of atherosclerosis extends 50 mm from the LAD/LCX bifurcation. D1 branch has minimal mixed proximal disease.  Left Circumflex Artery: Lateral wall vessel. There is a mild 25-49% non-calcified stenosis of the proximal vessel (CADRADS2).  Aorta: Normal size, 32 mm at the mid ascending aorta (level of the PA bifurcation) measured double oblique. No calcifications. No dissection.  Aortic Valve: Trileaflet. No calcifications.  Other findings:  Normal pulmonary vein drainage into the left atrium.  Normal left atrial appendage without a thrombus.  Normal size of the pulmonary artery.  IMPRESSION: 1. 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 CT FFR will be performed and reported separately.  2. Coronary calcium  score of 431. This was 98th percentile for age and sex matched control.  3. Normal coronary origin with right dominance.  4. Definitive cardiac catheterization is recommended.   Electronically Signed By: Vinie JAYSON Maxcy M.D. On: 03/18/2022 11:35  Narrative EXAM: OVER-READ INTERPRETATION  CT CHEST  The following report is a limited chest CT  over-read performed by radiologist Dr. Toribio Aye of [facility] on 03/18/2022. The coronary calcium  score and cardiac CTA interpretation by the cardiologist is attached.  COMPARISON:  None Available.  FINDINGS: Small left lower lobe pulmonary nodule measuring 8 mm abutting the left hemidiaphragm (axial image 42 of series 11). Within the visualized portions of the thorax there is no acute consolidative airspace disease, no pleural effusions, no pneumothorax and no lymphadenopathy. Visualized portions of the upper abdomen are unremarkable. There are no aggressive appearing lytic or blastic lesions noted in the visualized portions of the skeleton.  IMPRESSION: 1. 8 mm left lower lobe pulmonary nodule. Non-contrast chest CT at 6-12 months is recommended. If the nodule is stable at time of repeat CT, then future CT at 18-24 months (from today's scan) is considered optional for low-risk patients, but is recommended for high-risk patients. This recommendation follows the consensus statement: Guidelines for Management of Incidental Pulmonary Nodules Detected on CT Images: From the Fleischner Society 2017; Radiology 2017; 284:228-243.  Electronically Signed: By: Toribio Aye M.D. On: 03/18/2022 11:03   CT SCANS  CT CORONARY FRACTIONAL FLOW RESERVE DATA PREP 03/18/2022  Narrative EXAM: CT FFR ANALYSIS  CLINICAL DATA:  angina  FINDINGS: FFRct analysis was performed on the original cardiac CT angiogram dataset. Diagrammatic representation of the FFRct analysis is provided in a separate PDF document in PACS. This dictation was created using the PDF document and an interactive 3D model of the results. 3D model is not available in the EMR/PACS. Normal FFR range is >0.80.  1. Left Main:  No significant stenosis. FFR = 1.00  2. LAD: Significant stenosis. Proximal FFR = 0.97, Mid FFR = 0.76, Distal FFR = 0.61 3. LCX: No significant stenosis. Proximal FFR =  0.94, Distal FFR = 0.81 4. RCA: No significant stenosis. Proximal FFR =0.99, Mid FFR = 0.93, Distal FFR = 0.87  IMPRESSION: 1.  CT FFR does show significant proximal to mid LAD stenosis.  2.  Definitive cardiac catheterization is recommended.   Electronically Signed By: Vinie JAYSON Maxcy M.D. 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  He does not appears to be euvolemic.  For this reason, I recommend confirming this with [facility] as it may lead to decisions about HD.  He is amenable and this is scheduled for 08/13/24  Risks and benefits of cardiac catheterization have been discussed with the patient.  These include bleeding, infection, kidney damage, stroke, heart attack, death.  The patient understands these risks and is willing to proceed.  Access recommendations: R AC Procedural considerations: Do not suspect he will need leave in sw[place]   Non obstructive CAD HLD - on atorvastatin , non plavix given PCP has him  [facility]  Morbid obesity - GLP1RA health this admission  As per primary or other consultants - AKI on CKD IV - T2DM - PE/DVT on AC - OSA - Asthma - GERD   For  questions or updates, please contact [facility] Please consult [email or web address] for contact info under Cardiology/STEMI.      Stanly Leavens, MD FASE [facility] Cardiologist [facility]  [address] [place], [address] [place], [place] [address] [phone]  8:50 AM

## 2024-08-13 ENCOUNTER — Encounter (HOSPITAL_COMMUNITY): Admission: EM | Disposition: A | Payer: Self-pay | Source: Home / Self Care | Attending: Infectious Diseases

## 2024-08-13 DIAGNOSIS — I509 Heart failure, unspecified: Secondary | ICD-10-CM

## 2024-08-13 DIAGNOSIS — I272 Pulmonary hypertension, unspecified: Secondary | ICD-10-CM | POA: Diagnosis not present

## 2024-08-13 DIAGNOSIS — I5033 Acute on chronic diastolic (congestive) heart failure: Secondary | ICD-10-CM | POA: Diagnosis not present

## 2024-08-13 DIAGNOSIS — N185 Chronic kidney disease, stage 5: Secondary | ICD-10-CM

## 2024-08-13 HISTORY — PX: RIGHT HEART CATH: CATH118263

## 2024-08-13 LAB — POCT I-STAT EG7
Acid-base deficit: 1 mmol/L (ref 0.0–2.0)
Acid-base deficit: 1 mmol/L (ref 0.0–2.0)
Bicarbonate: 24.6 mmol/L (ref 20.0–28.0)
Bicarbonate: 25.3 mmol/L (ref 20.0–28.0)
Calcium, Ion: 1.15 mmol/L (ref 1.15–1.40)
Calcium, Ion: 1.16 mmol/L (ref 1.15–1.40)
HCT: 32 % — ABNORMAL LOW (ref 39.0–52.0)
HCT: 33 % — ABNORMAL LOW (ref 39.0–52.0)
Hemoglobin: 10.9 g/dL — ABNORMAL LOW (ref 13.0–17.0)
Hemoglobin: 11.2 g/dL — ABNORMAL LOW (ref 13.0–17.0)
O2 Saturation: 67 %
O2 Saturation: 68 %
Potassium: 3.2 mmol/L — ABNORMAL LOW (ref 3.5–5.1)
Potassium: 3.2 mmol/L — ABNORMAL LOW (ref 3.5–5.1)
Sodium: 139 mmol/L (ref 135–145)
Sodium: 140 mmol/L (ref 135–145)
TCO2: 26 mmol/L (ref 22–32)
TCO2: 27 mmol/L (ref 22–32)
pCO2, Ven: 45.6 mmHg (ref 44–60)
pCO2, Ven: 46.6 mmHg (ref 44–60)
pH, Ven: 7.339 (ref 7.25–7.43)
pH, Ven: 7.342 (ref 7.25–7.43)
pO2, Ven: 37 mmHg (ref 32–45)
pO2, Ven: 38 mmHg (ref 32–45)

## 2024-08-13 LAB — GLUCOSE, CAPILLARY
Glucose-Capillary: 138 mg/dL — ABNORMAL HIGH (ref 70–99)
Glucose-Capillary: 163 mg/dL — ABNORMAL HIGH (ref 70–99)
Glucose-Capillary: 225 mg/dL — ABNORMAL HIGH (ref 70–99)
Glucose-Capillary: 233 mg/dL — ABNORMAL HIGH (ref 70–99)
Glucose-Capillary: 203 mg/dL — ABNORMAL HIGH (ref 70–99)

## 2024-08-13 LAB — RENAL FUNCTION PANEL
Albumin: 2.9 g/dL — ABNORMAL LOW (ref 3.5–5.0)
Anion gap: 16 — ABNORMAL HIGH (ref 5–15)
BUN: 88 mg/dL — ABNORMAL HIGH (ref 6–20)
CO2: 21 mmol/L — ABNORMAL LOW (ref 22–32)
Calcium: 8.3 mg/dL — ABNORMAL LOW (ref 8.9–10.3)
Chloride: 101 mmol/L (ref 98–111)
Creatinine, Ser: 5.44 mg/dL — ABNORMAL HIGH (ref 0.61–1.24)
GFR, Estimated: 12 mL/min — ABNORMAL LOW (ref >60)
Glucose, Bld: 153 mg/dL — ABNORMAL HIGH (ref 70–99)
Phosphorus: 5.9 mg/dL — ABNORMAL HIGH (ref 2.5–4.6)
Potassium: 3.5 mmol/L (ref 3.5–5.1)
Sodium: 138 mmol/L (ref 135–145)

## 2024-08-13 SURGERY — RIGHT HEART CATH
Anesthesia: LOCAL

## 2024-08-13 MED ORDER — HEPARIN (PORCINE) IN NACL 1000-0.9 UT/500ML-% IV SOLN
INTRAVENOUS | Status: DC | PRN
Start: 2024-08-13 — End: 2024-08-13
  Administered 2024-08-13: 500 mL via SURGICAL_CAVITY

## 2024-08-13 MED ORDER — FENTANYL CITRATE (PF) 100 MCG/2ML IJ SOLN
INTRAMUSCULAR | Status: DC | PRN
  Administered 2024-08-13: 25 ug via INTRAVENOUS

## 2024-08-13 MED ORDER — LIDOCAINE HCL (PF) 1 % IJ SOLN
INTRAMUSCULAR | Status: AC
  Filled 2024-08-13: qty 30

## 2024-08-13 MED ORDER — MIDAZOLAM HCL (PF) 2 MG/2ML IJ SOLN
INTRAMUSCULAR | Status: DC | PRN
  Administered 2024-08-13: 1 mg via INTRAVENOUS

## 2024-08-13 MED ORDER — MIDAZOLAM HCL 2 MG/2ML IJ SOLN
INTRAMUSCULAR | Status: AC
  Filled 2024-08-13: qty 2

## 2024-08-13 MED ORDER — FENTANYL CITRATE (PF) 100 MCG/2ML IJ SOLN
INTRAMUSCULAR | Status: AC
  Filled 2024-08-13: qty 2

## 2024-08-13 MED ORDER — SODIUM CHLORIDE 0.9% FLUSH
3.0000 mL | Freq: Two times a day (BID) | INTRAVENOUS | Status: DC
  Administered 2024-08-13: 3 mL via INTRAVENOUS

## 2024-08-13 MED ORDER — INSULIN GLARGINE-YFGN 100 UNIT/ML ~~LOC~~ SOLN
28.0000 [IU] | Freq: Every day | SUBCUTANEOUS | Status: DC
  Administered 2024-08-13 – 2024-08-14 (×2): 28 [IU] via SUBCUTANEOUS
  Filled 2024-08-13 (×3): qty 0.28

## 2024-08-13 MED ORDER — SODIUM CHLORIDE 0.9 % IV SOLN: 250.0000 mL | INTRAVENOUS | Status: DC | PRN

## 2024-08-13 MED ORDER — LIDOCAINE HCL (PF) 1 % IJ SOLN
INTRAMUSCULAR | Status: DC | PRN
Start: 2024-08-13 — End: 2024-08-13
  Administered 2024-08-13: 5 mL
  Administered 2024-08-13: 10 mL

## 2024-08-13 MED ORDER — SODIUM CHLORIDE 0.9% FLUSH: 3.0000 mL | INTRAVENOUS | Status: DC | PRN

## 2024-08-13 SURGICAL SUPPLY — 9 items
CATH BALLN WEDGE 5F 110CM (CATHETERS) IMPLANT
CATH SWAN GANZ 7F STRAIGHT (CATHETERS) IMPLANT
KIT MICROPUNCTURE NIT STIFF (SHEATH) IMPLANT
PACK CARDIAC CATHETERIZATION (CUSTOM PROCEDURE TRAY) ×1 IMPLANT
SHEATH GLIDE SLENDER 4/5FR (SHEATH) IMPLANT
SHEATH PINNACLE 7F 10CM (SHEATH) IMPLANT
SHEATH PROBE COVER 6X72 (BAG) IMPLANT
TRANSDUCER W/STOPCOCK (MISCELLANEOUS) IMPLANT
TUBING ART PRESS 72 MALE/FEM (TUBING) IMPLANT

## 2024-08-13 NOTE — Progress Notes (Signed)
 Progress Note  Patient Name: Leroy Simmons Date of Encounter: 08/13/2024 Primary Cardiologist: Soyla DELENA Merck, MD   Subjective   Patient's RA pressure is 4.  Essentially euovlemic.  Notes slight discomfort with IV access site.  Vital Signs    Vitals:   08/13/24 1145 08/13/24 1200 08/13/24 1215 08/13/24 1230  BP: (!) 150/96 (!) 136/91 124/79 139/88  Pulse:      Resp:      Temp:      TempSrc:      SpO2:      Weight:      Height:        Intake/Output Summary (Last 24 hours) at 08/13/2024 1243 Last data filed at 08/13/2024 0418 Gross per 24 hour  Intake 480 ml  Output 900 ml  Net -420 ml   Filed Weights   08/11/24 0843 08/12/24 0434 08/13/24 0546  Weight: 107.9 kg 107.1 kg 105.6 kg    Physical Exam   GEN: No acute distress.   Neck: No JVD  Cardiac: RRR, no murmurs, rubs, or gallops.  Respiratory: Clear to auscultation bilaterally. GI: Soft, nontender, non-distended  MS:  no edema  Labs   Telemetry: SR   Chemistry Recent Labs  Lab 08/11/24 0221 08/12/24 0232 08/13/24 0239  NA 138 137 138  K 3.6 3.8 3.5  CL 100 100 101  CO2 23 22 21*  GLUCOSE 175* 242* 153*  BUN 72* 80* 88*  CREATININE 4.73* 5.09* 5.44*  CALCIUM  8.4* 8.4* 8.3*  ALBUMIN  2.8* 2.8* 2.9*  GFRNONAA 14* 13* 12*  ANIONGAP 15 15 16*     Hematology Recent Labs  Lab 08/08/24 0559 08/09/24 0308 08/11/24 0221  WBC 8.7 8.4 8.2  RBC 3.50* 3.61* 3.91*  HGB 10.6* 11.3* 12.0*  HCT 32.2* 32.8* 35.3*  MCV 92.0 90.9 90.3  MCH 30.3 31.3 30.7  MCHC 32.9 34.5 34.0  RDW 11.7 11.5 11.5  PLT 266 335 372    Cardiac Studies   Cardiac Studies & Procedures   ______________________________________________________________________________________________ CARDIAC CATHETERIZATION  CARDIAC CATHETERIZATION 08/13/2024  Conclusion 1. Mildly elevated PCWP. 2. 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 3. Normal RA pressure. 4. Normal cardiac output. 5. Good PAPi.   CARDIAC  CATHETERIZATION  CARDIAC CATHETERIZATION 03/21/2022  Conclusion   2nd Diag lesion is 50% stenosed.   Mid LAD lesion is 40% stenosed.   Ramus lesion is 25% stenosed.   LV end diastolic pressure is severely elevated.  LVEDP 33 mm Hg.   There is no aortic valve stenosis.  Nonobstructive CAD. Volume overload. Continue diuresis.  Findings Coronary Findings Diagnostic  Dominance: Right  Left Anterior Descending The vessel exhibits minimal luminal irregularities. Mid LAD lesion is 40% stenosed.  Second Diagonal Branch 2nd Diag lesion is 50% stenosed.  Ramus Intermedius Ramus lesion is 25% stenosed.  Intervention  No interventions have been documented.     ECHOCARDIOGRAM  ECHOCARDIOGRAM COMPLETE 08/06/2024  Narrative ECHOCARDIOGRAM REPORT    Patient Name:   Leroy Simmons Date of Exam: 08/06/2024 Medical Rec #:  [id number]      Height:       68.0 in Accession #:    [id number]     Weight:       245.0 lb Date of Birth:  1971-08-02      BSA:          2.228 m Patient Age:    52 years       BP:  158/102 mmHg Patient Gender: M              HR:           83 bpm. Exam Location:  Inpatient  Procedure: 2D Echo (Both Spectral and Color Flow Doppler were utilized during procedure).  STAT ECHO  Indications:    Dyspnea R06.00  History:        Patient has prior history of Echocardiogram examinations, most recent 01/10/2024. Cardiomegaly, CAD, Pulmonary HTN, Signs/Symptoms:Chest Pain and Dyspnea; Risk Factors:Hypertension, Diabetes and Hyperlipidemia.  Sonographer:    BERNARDA ROCKS Referring Phys: [id number] HAYLEY N NAASZ  IMPRESSIONS   1. Left ventricular ejection fraction, by estimation, is 60 to 65%. The left ventricle has normal function. The left ventricle has no regional wall motion abnormalities. Indeterminate diastolic filling due to E-A fusion. 2. Right ventricular systolic function is normal. The right ventricular size is normal. Tricuspid regurgitation signal  is inadequate for assessing PA pressure. 3. Left atrial size was moderately dilated. 4. The mitral valve is normal in structure. No evidence of mitral valve regurgitation. No evidence of mitral stenosis. 5. The aortic valve was not well visualized. Aortic valve regurgitation is not visualized. No aortic stenosis is present. 6. The inferior vena cava is normal in size with greater than 50% respiratory variability, suggesting right atrial pressure of 3 mmHg.  Comparison(s): No significant change from prior study. Prior images reviewed side by side.  FINDINGS Left Ventricle: Left ventricular ejection fraction, by estimation, is 60 to 65%. The left ventricle has normal function. The left ventricle has no regional wall motion abnormalities. The left ventricular internal cavity size was normal in size. There is no left ventricular hypertrophy. Indeterminate diastolic filling due to E-A fusion.  Right Ventricle: The right ventricular size is normal. No increase in right ventricular wall thickness. Right ventricular systolic function is normal. Tricuspid regurgitation signal is inadequate for assessing PA pressure.  Left Atrium: Left atrial size was moderately dilated.  Right Atrium: Right atrial size was normal in size.  Pericardium: There is no evidence of pericardial effusion.  Mitral Valve: The mitral valve is normal in structure. No evidence of mitral valve regurgitation. No evidence of mitral valve stenosis. MV peak gradient, 9.5 mmHg. The mean mitral valve gradient is 3.0 mmHg.  Tricuspid Valve: The tricuspid valve is normal in structure. Tricuspid valve regurgitation is not demonstrated. No evidence of tricuspid stenosis.  Aortic Valve: The aortic valve was not well visualized. Aortic valve regurgitation is not visualized. No aortic stenosis is present. Aortic valve mean gradient measures 3.0 mmHg. Aortic valve peak gradient measures 6.8 mmHg. Aortic valve area, by VTI measures 4.35  cm.  Pulmonic Valve: The pulmonic valve was normal in structure. Pulmonic valve regurgitation is not visualized. No evidence of pulmonic stenosis.  Aorta: The aortic root and ascending aorta are structurally normal, with no evidence of dilitation.  Venous: The inferior vena cava is normal in size with greater than 50% respiratory variability, suggesting right atrial pressure of 3 mmHg.  IAS/Shunts: No atrial level shunt detected by color flow Doppler.   LEFT VENTRICLE PLAX 2D LVIDd:         5.50 cm      Diastology LVIDs:         3.30 cm      LV e' medial:    6.74 cm/s LV PW:         0.90 cm      LV E/e' medial:  22.0 LV IVS:  1.00 cm      LV e' lateral:   9.03 cm/s LVOT diam:     2.30 cm      LV E/e' lateral: 16.4 LV SV:         92 LV SV Index:   41 LVOT Area:     4.15 cm  LV Volumes (MOD) LV vol d, MOD A2C: 173.0 ml LV vol d, MOD A4C: 183.0 ml LV vol s, MOD A2C: 57.9 ml LV vol s, MOD A4C: 80.0 ml LV SV MOD A2C:     115.1 ml LV SV MOD A4C:     183.0 ml LV SV MOD BP:      115.6 ml  RIGHT VENTRICLE RV Basal diam:  3.60 cm RV S prime:     12.60 cm/s TAPSE (M-mode): 2.1 cm  LEFT ATRIUM              Index        RIGHT ATRIUM           Index LA diam:        5.00 cm  2.24 cm/m   RA Area:     13.70 cm LA Vol (A2C):   82.4 ml  36.99 ml/m  RA Volume:   27.50 ml  12.34 ml/m LA Vol (A4C):   109.0 ml 48.93 ml/m LA Biplane Vol: 97.2 ml  43.63 ml/m AORTIC VALVE                    PULMONIC VALVE AV Area (Vmax):    4.22 cm     PV Vmax:          1.01 m/s AV Area (Vmean):   3.57 cm     PV Peak grad:     4.0 mmHg AV Area (VTI):     4.35 cm     PR End Diast Vel: 2.93 msec AV Vmax:           130.00 cm/s AV Vmean:          82.700 cm/s AV VTI:            0.211 m AV Peak Grad:      6.8 mmHg AV Mean Grad:      3.0 mmHg LVOT Vmax:         132.00 cm/s LVOT Vmean:        71.000 cm/s LVOT VTI:          0.221 m LVOT/AV VTI ratio: 1.05  AORTA Ao Root diam: 3.40 cm Ao Asc  diam:  3.60 cm  MITRAL VALVE MV Area (PHT): 7.74 cm     SHUNTS MV Area VTI:   3.35 cm     Systemic VTI:  0.22 m MV Peak grad:  9.5 mmHg     Systemic Diam: 2.30 cm MV Mean grad:  3.0 mmHg MV Vmax:       1.54 m/s MV Vmean:      77.6 cm/s MV Decel Time: 98 msec MV E velocity: 148.00 cm/s MV A velocity: 109.00 cm/s MV E/A ratio:  1.36  Stanly Leavens MD Electronically signed by Stanly Leavens MD Signature Date/Time: 08/06/2024/12:04:35 PM    Final      CT SCANS  CT CORONARY MORPH W/CTA COR W/SCORE 03/18/2022  Addendum 03/18/2022 11:37 AM ADDENDUM REPORT: 03/18/2022 11:35  HISTORY: 53 yo male with chest pain/anginal equiv, intermediate CAD risk, not treadmill candidate  EXAM: Cardiac/Coronary CTA  TECHNIQUE: The patient was scanned on a Siemens  Force scanner.  PROTOCOL: A 120 kV prospective scan was triggered in the descending thoracic aorta at 111 HU's. Axial non-contrast 3 mm slices were carried out through the heart. The data set was analyzed on a dedicated work station and scored using the Agatson method. Gantry rotation speed was 250 msecs and collimation was .6 mm. Beta blockade and 0.8 mg of sl NTG was given. The 3D data set was reconstructed in 5% intervals of the 35-75 % of the R-R cycle. Diastolic phases were analyzed on a dedicated work station using MPR, MIP and VRT modes. The patient received 80mL OMNIPAQUE  IOHEXOL  350 MG/ML SOLN of contrast.  FINDINGS: Quality: Good, HR 68  Coronary calcium  score: The patient's coronary artery calcium  score is 431, which places the patient in the 98th percentile.  Coronary arteries: Normal coronary origins.  Right dominance.  Right Coronary Artery: Dominant. Mild mixed proximal 25-49% stenosis (CADRADS1). There is plaque at the crux with positive remodeling and spotty calcification. There is minimal 1-24% mixed distal vessel stenosis.  Left Main Coronary Artery: Normal. Bifurcates into the LAD and  LCx arteries.  Left Anterior Descending Coronary Artery: Anterior artery which wraps around the apex. There is eccentric ostial calcification and spotty proximal calcification with associated low attenuation plaque and 70-99% stenoisis (CADRADS4a) - the LAD measures 1.02 mm at its smallest point. The proximal segment of atherosclerosis extends 50 mm from the LAD/LCX bifurcation. D1 branch has minimal mixed proximal disease.  Left Circumflex Artery: Lateral wall vessel. There is a mild 25-49% non-calcified stenosis of the proximal vessel (CADRADS2).  Aorta: Normal size, 32 mm at the mid ascending aorta (level of the PA bifurcation) measured double oblique. No calcifications. No dissection.  Aortic Valve: Trileaflet. No calcifications.  Other findings:  Normal pulmonary vein drainage into the left atrium.  Normal left atrial appendage without a thrombus.  Normal size of the pulmonary artery.  IMPRESSION: 1. 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 CT FFR will be performed and reported separately.  2. Coronary calcium  score of 431. This was 98th percentile for age and sex matched control.  3. Normal coronary origin with right dominance.  4. Definitive cardiac catheterization is recommended.   Electronically Signed By: Vinie JAYSON Maxcy M.D. On: 03/18/2022 11:35  Narrative EXAM: OVER-READ INTERPRETATION  CT CHEST  The following report is a limited chest CT over-read performed by radiologist Dr. Toribio Aye of [facility] on 03/18/2022. The coronary calcium  score and cardiac CTA interpretation by the cardiologist is attached.  COMPARISON:  None Available.  FINDINGS: Small left lower lobe pulmonary nodule measuring 8 mm abutting the left hemidiaphragm (axial image 42 of series 11). Within the visualized portions of the thorax there is no acute consolidative airspace disease, no pleural effusions, no  pneumothorax and no lymphadenopathy. Visualized portions of the upper abdomen are unremarkable. There are no aggressive appearing lytic or blastic lesions noted in the visualized portions of the skeleton.  IMPRESSION: 1. 8 mm left lower lobe pulmonary nodule. Non-contrast chest CT at 6-12 months is recommended. If the nodule is stable at time of repeat CT, then future CT at 18-24 months (from today's scan) is considered optional for low-risk patients, but is recommended for high-risk patients. This recommendation follows the consensus statement: Guidelines for Management of Incidental Pulmonary Nodules Detected on CT Images: From the Fleischner Society 2017; Radiology 2017; 284:228-243.  Electronically Signed: By: Toribio Aye M.D. On: 03/18/2022 11:03   CT SCANS  CT  CORONARY FRACTIONAL FLOW RESERVE DATA PREP 03/18/2022  Narrative EXAM: CT FFR ANALYSIS  CLINICAL DATA:  angina  FINDINGS: FFRct analysis was performed on the original cardiac CT angiogram dataset. Diagrammatic representation of the FFRct analysis is provided in a separate PDF document in PACS. This dictation was created using the PDF document and an interactive 3D model of the results. 3D model is not available in the EMR/PACS. Normal FFR range is >0.80.  1. Left Main:  No significant stenosis. FFR = 1.00  2. LAD: Significant stenosis. Proximal FFR = 0.97, Mid FFR = 0.76, Distal FFR = 0.61 3. LCX: No significant stenosis. Proximal FFR = 0.94, Distal FFR = 0.81 4. RCA: No significant stenosis. Proximal FFR =0.99, Mid FFR = 0.93, Distal FFR = 0.87  IMPRESSION: 1.  CT FFR does show significant proximal to mid LAD stenosis.  2.  Definitive cardiac catheterization is recommended.   Electronically Signed By: Vinie JAYSON Maxcy M.D. 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 - Diuretic regimen: Tomorrow can consider torsemide  60 mg PO daily based on creatinine; he will need either discharge diuretics or close PCP/Nephrology f/u - Coreg  25 mg PO BIG for HTN - ARNI/ARB/ACEi deferred as prior attempt for this decreased GFR and was not tolerated - SGLT2i & aldactone/eplerenone deferred with variable creatinine - hydralazine  for afterload reduction with 10 mg TID     Non obstructive CAD HLD - on atorvastatin , no plavix given PCP has him on AC (PE)  Morbid obesity - GLP1RA health this admission  As per primary or other consultants - AKI on CKD IV  - T2DM - 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  Currently he is euvolemic.   We discussed diet and exercise and noted that this will likely not be enough to keep him euvolemic but is still important.  Likewise we discussed limitations of natural diuretics such as red clover, for CKD Stage V.  It is my clinical opinion that in his life-time he will need dialysis.  He has a great support system and great care team. - will arrange f/u with Dr. Laurence team and sign off - has IM and [place] team following, if there are clinical questions I am on service 11/12 and am happy to re-assess or rediscuss - patient had no further questions   For questions or updates, please contact [facility] Please consult [email or web address] for contact info under Cardiology/STEMI.      Stanly Leavens, MD FASE [facility] Cardiologist [facility]  [address] [place], [address] [place], [place] [address] [phone]  12:43 PM

## 2024-08-13 NOTE — Progress Notes (Addendum)
 [facility] NEPHROLOGY PROGRESS NOTE  Assessment/ Plan: Pt is a 53 y.o. yo male with past medical history significant for hypertension, type 2 diabetes, PE, CKD 4, CHF who was admitted for acute on chronic CHF and AKI.    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  Acute change in GFR likely hemodynamically mediated caused by Entresto, diuretics.  He was treated with IV diuretics with significant improvement of heart failure.  However both BUN and creatinine levels are trending up.  I lowered diuretics yesterday and holding this morning while undergoing right heart cath to assess volume status.  His volume status looks acceptable and no evidence of uremia.  I have discussed with the patient and his partner that he may be heading towards dialysis if no improvement renal function. Continue strict ins and out and monitor lab.  # HFpEF LVEF 60%: Treating with diuretics, adjustment of dose as above.  Holding [place] because of AKI.  -Holding diuretics this morning because of worsening renal failure. Decide about diuretics after right heart cath.  # CKD stage IV: Thought to be due to diabetic nephropathy and was seen by [facility] in the past and then lost to follow-up.  Reportedly the baseline creatinine level is around 3.  #Hypertension: Monitor BP with diuresis.  # CKD-MBD/secondary hyperparathyroidism: PTH level 112 acceptable for CKD, monitor ca, phos.   #Normocytic Anemia -TSAT 12% F116 -> already given Venofer 500mg . Hb improved.   Subjective: Seen and examined at the bedside.  Plan for right cath today.  Urine output was recorded around 1 L.  He was concerned and worried about declining GFR.  His partner was presented at the bedside as well.  No nausea, vomiting, chest pain or shortness of breath. Objective Vital signs in last 24 hours: Vitals:   08/13/24 0351 08/13/24 0546 08/13/24 0748 08/13/24 1024  BP: 127/83  126/78   Pulse: 75  74   Resp: 16  17   Temp: (!) 97.3 F (36.3  C)  (!) 97.3 F (36.3 C)   TempSrc: Oral  Oral   SpO2: 99%  98% 97%  Weight:  105.6 kg    Height:       Weight change: -2.3 kg  Intake/Output Summary (Last 24 hours) at 08/13/2024 1107 Last data filed at 08/13/2024 0418 Gross per 24 hour  Intake 720 ml  Output 900 ml  Net -180 ml       Labs: RENAL PANEL Recent Labs  Lab 08/07/24 0322 08/07/24 1115 08/08/24 0559 08/09/24 0308 08/09/24 1647 08/10/24 0700 08/11/24 0221 08/12/24 0232 08/13/24 0239  NA 142   < > 141 136 137 137 138 137 138  K 3.7   < > 3.8 3.5 3.9 3.7 3.6 3.8 3.5  CL 106   < > 103 101 103 102 100 100 101  CO2 24   < > 24 21* 22 21* 23 22 21*  GLUCOSE 126*   < > 128* 235* 164* 249* 175* 242* 153*  BUN 48*   < > 50* 57* 60* 64* 72* 80* 88*  CREATININE 3.53*   < > 3.88* 4.41* 4.78* 4.95* 4.73* 5.09* 5.44*  CALCIUM  8.3*   < > 8.4* 8.3* 8.2* 8.2* 8.4* 8.4* 8.3*  MG 2.1  --  2.1 2.0  --   --  2.2 2.2  --   PHOS  --    < >  --  4.6  --  5.0* 5.1* 5.4* 5.9*  ALBUMIN   --    < >  --  2.6*  --  2.7* 2.8* 2.8* 2.9*   < > = values in this interval not displayed.    Liver Function Tests: Recent Labs  Lab 08/11/24 0221 08/12/24 0232 08/13/24 0239  ALBUMIN  2.8* 2.8* 2.9*   No results for input(s): LIPASE, AMYLASE in the last 168 hours. No results for input(s): AMMONIA in the last 168 hours. CBC: Recent Labs    08/06/24 0146 08/07/24 0322 08/08/24 0559 08/09/24 0308 08/11/24 0221  HGB 11.5* 10.2* 10.6* 11.3* 12.0*  MCV 93.6 91.8 92.0 90.9 90.3  VITAMINB12  --  370  --   --   --   FERRITIN  --  116  --   --   --   TIBC  --  242*  --   --   --   IRON  --  28*  --   --   --     Cardiac Enzymes: No results for input(s): CKTOTAL, CKMB, CKMBINDEX, TROPONINI in the last 168 hours. CBG: Recent Labs  Lab 08/12/24 1216 08/12/24 1614 08/12/24 1818 08/12/24 2120 08/13/24 0618  GLUCAP 173* 178* 208* 215* 138*    Iron Studies: No results for input(s): IRON, TIBC, TRANSFERRIN,  FERRITIN in the last 72 hours. Studies/Results: No results found.  Medications: Infusions:  sodium chloride       Scheduled Medications:  [MAR Hold] apixaban   5 mg Oral BID   [MAR Hold] atorvastatin   80 mg Oral QHS   [MAR Hold] carvedilol   25 mg Oral BID WC   [MAR Hold] dorzolamide -timolol   1 drop Both Eyes BID   [MAR Hold] guaiFENesin   600 mg Oral BID   [MAR Hold] hydrALAZINE   10 mg Oral Q8H   [MAR Hold] insulin  aspart  0-20 Units Subcutaneous TID WC   [MAR Hold] insulin  aspart  0-5 Units Subcutaneous QHS   [MAR Hold] insulin  aspart  6 Units Subcutaneous TID WC   [MAR Hold] insulin  glargine-yfgn  28 Units Subcutaneous QHS   [MAR Hold] latanoprost   1 drop Both Eyes QHS   [MAR Hold] lidocaine   1 patch Transdermal Q24H   [MAR Hold] pantoprazole   40 mg Oral Daily   [MAR Hold] senna  1 tablet Oral BID   sodium chloride  flush  3 mL Intravenous Q12H    have reviewed scheduled and prn medications.  Physical Exam: General:NAD, comfortable, able to lie flat Heart:RRR, s1s2 nl Lungs:clear b/l, no crackle Abdomen:soft, Non-tender, non-distended Extremities: Bilateral lower extremities edema present +with dressing on.  Neurology: Alert awake and following commands  Baylin Cabal [name] 08/13/2024,11:07 AM  LOS: 6 days

## 2024-08-13 NOTE — Interval H&P Note (Signed)
 History and Physical Interval Note:  08/13/2024 10:36 AM  Leroy Simmons  has presented today for surgery, with the diagnosis of heart failure.  The various methods of treatment have been discussed with the patient and family. After consideration of risks, benefits and other options for treatment, the patient has consented to  Procedure(s): RIGHT HEART CATH (N/A) as a surgical intervention.  The patient's history has been reviewed, patient examined, no change in status, stable for surgery.  I have reviewed the patient's chart and labs.  Questions were answered to the patient's satisfaction.     Takeyla Million [employer]

## 2024-08-13 NOTE — Progress Notes (Signed)
 Mobility Specialist Progress Note:    08/13/24 1449  Mobility  Activity Ambulated with assistance  Level of Assistance Contact guard assist, steadying assist  Assistive Device Front wheel walker  Distance Ambulated (ft) 400 ft  Range of Motion/Exercises Active  Activity Response Tolerated fair  Mobility Referral Yes  Mobility visit 1 Mobility  Mobility Specialist Start Time (ACUTE ONLY) 1449  Mobility Specialist Stop Time (ACUTE ONLY) 1500  Mobility Specialist Time Calculation (min) (ACUTE ONLY) 11 min   Received pt agreeable to session sitting EOB. Pt c/o of some neck soreness from procedure and feeling fatigued but otherwise doing well. Pt moving and ambulating decently needing to take 2 breaks throughout session. Returned pt to bed w/ all needs met.   Venetia Keel Mobility Specialist Please Neurosurgeon or Rehab Office at [phone]

## 2024-08-13 NOTE — Plan of Care (Signed)
   Problem: Education: Goal: Ability to describe self-care measures that may prevent or decrease complications (Diabetes Survival Skills Education) will improve Outcome: Progressing   Problem: Coping: Goal: Ability to adjust to condition or change in health will improve Outcome: Progressing   Problem: Fluid Volume: Goal: Ability to maintain a balanced intake and output will improve Outcome: Progressing

## 2024-08-13 NOTE — Progress Notes (Signed)
 HD#6 SUBJECTIVE:  Patient Summary: Leroy Simmons is a 53 y.o. with a pertinent PMH of HFpEF, PE 2025, CAD, asthma, T2DM, and CKD4 who presented with SOB and admitted for acute on chronic heart failure exacerbation.    Overnight Events: none   Interim History: Accompanied by partner. He slept okay, is breathing okay, denies any pain. He's worried about the procedure today and his rising Cr. Discussed that the procedure will help determine the cause of his increased fluid and allow his care team to better treat him.   OBJECTIVE:  Vital Signs: Vitals:   08/13/24 1215 08/13/24 1230 08/13/24 1300 08/13/24 1400  BP: 124/79 139/88 134/85 121/73  Pulse: 82     Resp: 20     Temp: 97.8 F (36.6 C)     TempSrc: Oral     SpO2: 99%     Weight:      Height:       Supplemental O2: Room Air SpO2: 99 % O2 Flow Rate (L/min): 2 L/min  Filed Weights   08/11/24 0843 08/12/24 0434 08/13/24 0546  Weight: 107.9 kg 107.1 kg 105.6 kg     Intake/Output Summary (Last 24 hours) at 08/13/2024 1550 Last data filed at 08/13/2024 1455 Gross per 24 hour  Intake 480 ml  Output 1500 ml  Net -1020 ml   Net IO Since Admission: -9,337.85 mL [08/13/24 1550]  Physical Exam: Physical Exam Vitals reviewed.  Constitutional:      General: He is not in acute distress. Cardiovascular:     Rate and Rhythm: Normal rate and regular rhythm.     Pulses: Normal pulses.     Heart sounds: Normal heart sounds.  Pulmonary:     Effort: Pulmonary effort is normal.     Breath sounds: Normal breath sounds. No decreased breath sounds.  Musculoskeletal:     Right lower leg: No tenderness. Edema present.     Left lower leg: No tenderness. Edema present.     Comments: Minimal improvement in swelling from yesterday  Unna boots on, socks on  No pain in legs with pressure  Skin:    General: Skin is warm.  Neurological:     General: No focal deficit present.     Mental Status: He is alert and oriented to person,  place, and time.  Psychiatric:        Mood and Affect: Mood normal.        Behavior: Behavior normal.     Patient Lines/Drains/Airways Status     Active Line/Drains/Airways     Name Placement date Placement time Site Days   Peripheral IV 08/06/24 20 G Left Antecubital 08/06/24  0736  Antecubital  7            Pertinent labs and imaging:      Latest Ref Rng & Units 08/11/2024    2:21 AM 08/09/2024    3:08 AM 08/08/2024    5:59 AM  CBC  WBC 4.0 - 10.5 K/uL 8.2  8.4  8.7   Hemoglobin 13.0 - 17.0 g/dL 87.9  88.6  89.3   Hematocrit 39.0 - 52.0 % 35.3  32.8  32.2   Platelets 150 - 400 K/uL 372  335  266        Latest Ref Rng & Units 08/13/2024    2:39 AM 08/12/2024    2:32 AM 08/11/2024    2:21 AM  CMP  Glucose 70 - 99 mg/dL 846  757  824   BUN  6 - 20 mg/dL 88  80  72   Creatinine 0.61 - 1.24 mg/dL 4.55  4.90  5.26   Sodium 135 - 145 mmol/L 138  137  138   Potassium 3.5 - 5.1 mmol/L 3.5  3.8  3.6   Chloride 98 - 111 mmol/L 101  100  100   CO2 22 - 32 mmol/L 21  22  23    Calcium  8.9 - 10.3 mg/dL 8.3  8.4  8.4     CARDIAC CATHETERIZATION Result Date: 08/13/2024 1. Mildly elevated PCWP. 2. 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 3. Normal RA pressure. 4. Normal cardiac output. 5. Good PAPi.    ASSESSMENT/PLAN:  Assessment: Principal Problem:   Acute on chronic diastolic heart failure with preserved ejection fraction (HCC) Active Problems:   Uncontrolled type 2 diabetes mellitus with hyperglycemia, with long-term current use of insulin  (HCC)   Anemia of chronic disease   History of DVT (deep vein thrombosis), LLE 2024   Chronic kidney disease (CKD), stage IV (severe) (HCC)   History of pulmonary embolism, LLL 01/2024   Peripheral edema   CKD (chronic kidney disease) stage 5, GFR less than 15 ml/min (HCC)   Plan: Acute on chronic diastolic heart failure exacerbation, LVEF 60-65% 08/2024 NYHA Class III Stage C UOP recorded at 522 mL in the past 24  hours, down ~12lbs since admission. RHC today showed RA pressure 4 suggesting his hypervolemia is largely renal in etiology. Will follow up with Dr. Laurence team outpatient.  -strict ins&outs -Magnesium  is 2.2, no replacement required today  -GDMT: Coreg  25 mg BID, limited due to rising Cr   CKD4 Nephrotic Range Proteinuria AGMA Baseline Cr ~3. Cr increase to 5.44 this AM, BUN 88. Suspect creatinine elevation is due to recent entresto use and possibly from high dose diuretics. Does not have uremic symptoms and no need for urgent dialysis although patients aware that he will likely need dialysis in the future. He met with Chaplain yesterday. Held diuretics this morning for RHC.   -Nephro consulted, appreciate recommendations  - renal diet with salt and fluid restriction - Unna boots, encourage elevation    Hypertension  BP 127/83 this morning.  -Hydralazine  10 mg TID, carvedilol  25 mg BID   Diabetes mellitus on chronic insulin  use A1c this admission 7.2. FBG this AM 138.  - Semglee  to 28 units  - 6 units TID with meals - Resistant SSI and at bedtime coverage   Renal Osteodystrophy Secondary Hyperparathyroidism PTH 112, vitamin D 25 15.30. Continue to monitor Ca and phos. Phos 5.9. Will defer phos binders to nephrology team.  -RFP in AM   CAD Hyperlipidemia Left heart cath 03/2022 that revealed 50% D2, 40% mid LAD, and 25% ramus lesions.  - atorvastatin  80 mg daily - Carvedilol  25 mg bid   Hypercoagulable, VTE Hx BL Vas US  c/w subQ edema and w/o evidence of DVT - Eliquis  5 mg BID   Normocytic anemia with iron deficiency Hgb 11.5 on admission. Gave IV Iron 11/5, remains stable.    OSA suspected Patient awaiting sleep studies.  - CPAP at night   Asthma - PRN albuterol    GERD - pantoprazole  40 mg daily  Best Practice: Diet: Diabetic diet and Renal diet IVF: Fluids: none, Rate: None VTE: SCDs Start: 08/06/24 1014 Code: Full  Disposition planning: Therapy Recs:  None, DME: none Family Contact: [place], at bedside. DISPO: Anticipated discharge in 1-2 days to Home pending clinical improvement.  Signature:  Viktoria Charmayne Maris  [facility] Internal Medicine Residency  3:50 PM, 08/13/2024  On Call pager [phone]

## 2024-08-14 ENCOUNTER — Encounter (HOSPITAL_COMMUNITY): Payer: Self-pay | Admitting: Cardiology

## 2024-08-14 ENCOUNTER — Inpatient Hospital Stay: Admitting: Nurse Practitioner

## 2024-08-14 DIAGNOSIS — N179 Acute kidney failure, unspecified: Secondary | ICD-10-CM

## 2024-08-14 DIAGNOSIS — I5033 Acute on chronic diastolic (congestive) heart failure: Secondary | ICD-10-CM | POA: Diagnosis not present

## 2024-08-14 DIAGNOSIS — R600 Localized edema: Secondary | ICD-10-CM | POA: Diagnosis not present

## 2024-08-14 DIAGNOSIS — Z419 Encounter for procedure for purposes other than remedying health state, unspecified: Secondary | ICD-10-CM | POA: Diagnosis not present

## 2024-08-14 LAB — RENAL FUNCTION PANEL
Albumin: 3 g/dL — ABNORMAL LOW (ref 3.5–5.0)
Anion gap: 17 — ABNORMAL HIGH (ref 5–15)
BUN: 93 mg/dL — ABNORMAL HIGH (ref 6–20)
CO2: 21 mmol/L — ABNORMAL LOW (ref 22–32)
Calcium: 8.4 mg/dL — ABNORMAL LOW (ref 8.9–10.3)
Chloride: 101 mmol/L (ref 98–111)
Creatinine, Ser: 5.76 mg/dL — ABNORMAL HIGH (ref 0.61–1.24)
GFR, Estimated: 11 mL/min — ABNORMAL LOW (ref >60)
Glucose, Bld: 195 mg/dL — ABNORMAL HIGH (ref 70–99)
Phosphorus: 6.5 mg/dL — ABNORMAL HIGH (ref 2.5–4.6)
Potassium: 3.6 mmol/L (ref 3.5–5.1)
Sodium: 139 mmol/L (ref 135–145)

## 2024-08-14 LAB — GLUCOSE, CAPILLARY
Glucose-Capillary: 177 mg/dL — ABNORMAL HIGH (ref 70–99)
Glucose-Capillary: 185 mg/dL — ABNORMAL HIGH (ref 70–99)
Glucose-Capillary: 173 mg/dL — ABNORMAL HIGH (ref 70–99)
Glucose-Capillary: 226 mg/dL — ABNORMAL HIGH (ref 70–99)

## 2024-08-14 LAB — MAGNESIUM: Magnesium: 2.3 mg/dL (ref 1.7–2.4)

## 2024-08-14 MED ORDER — HYDROCORT-PRAMOXINE (PERIANAL) 2.5-1 % EX CREA
1.0000 | TOPICAL_CREAM | CUTANEOUS | Status: DC | PRN
  Administered 2024-08-15 (×2): 1 via RECTAL
  Filled 2024-08-14: qty 30

## 2024-08-14 MED ORDER — SEVELAMER CARBONATE 800 MG PO TABS
800.0000 mg | ORAL_TABLET | Freq: Three times a day (TID) | ORAL | Status: DC
  Administered 2024-08-14 – 2024-08-16 (×5): 800 mg via ORAL
  Filled 2024-08-14 (×5): qty 1

## 2024-08-14 MED ORDER — DIPHENHYDRAMINE HCL 25 MG PO CAPS
25.0000 mg | ORAL_CAPSULE | Freq: Three times a day (TID) | ORAL | Status: DC | PRN
  Administered 2024-08-14: 25 mg via ORAL
  Filled 2024-08-14: qty 1

## 2024-08-14 NOTE — Progress Notes (Addendum)
 [facility] NEPHROLOGY PROGRESS NOTE  Assessment/ Plan: Pt is a 53 y.o. yo male with past medical history significant for hypertension, type 2 diabetes, PE, CKD 4, CHF who was admitted for acute on chronic CHF and AKI.    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  Acute change in GFR likely hemodynamically mediated caused by Entresto, diuretics.  He was treated with IV diuretics with significant improvement of heart failure.   - Both BUN and creatinine level trending up therefore we will hold further diuretics.  RHC on 11/11 with euvolemia.  Hopefully the creatinine level will plateau in next 1 to 2 days.  I have discussed with the patient that he may need dialysis if no improvement in renal function.  Fortunately, he has no sign of uremia and no urgent need for dialysis today.   -bladder scan -Continue strict ins and out and monitor lab.  # HFpEF LVEF 60%: Treated with diuretics which is on hold now because of AKI.  Continue to hold Entresto.  Seen by cardiologist.  # CKD stage IV: Thought to be due to diabetic nephropathy and was seen by [facility] in the past and then lost to follow-up.  Reportedly the baseline creatinine level is around 3.  #Hypertension: Monitor BP with diuresis.  # CKD-MBD/secondary hyperparathyroidism/hyperphosphatemia: PTH level 112 acceptable for CKD.  Start sevelamer for hyperphosphatemia.  Monitor calcium , phosphorus.  #Normocytic Anemia -TSAT 12% F116 -> already given Venofer 500mg . Hb improved.   Subjective: Seen and examined at the bedside.  Urine output is around 1 L.  He is able to lie flat and denies nausea, vomiting, dysgeusia, chest pain or shortness of breath.  No major event overnight. Objective Vital signs in last 24 hours: Vitals:   08/14/24 0318 08/14/24 0319 08/14/24 0507 08/14/24 0828  BP: 137/82   132/88  Pulse: 75 74  75  Resp: 16   18  Temp: 97.8 F (36.6 C)   (!) 97.2 F (36.2 C)  TempSrc: Oral   Oral  SpO2: 99%  99%  97%  Weight:   105.8 kg   Height:       Weight change: 0.2 kg  Intake/Output Summary (Last 24 hours) at 08/14/2024 1153 Last data filed at 08/14/2024 9167 Gross per 24 hour  Intake 600 ml  Output 1150 ml  Net -550 ml       Labs: RENAL PANEL Recent Labs  Lab 08/08/24 0559 08/09/24 0308 08/09/24 1647 08/10/24 0700 08/11/24 0221 08/12/24 0232 08/13/24 0239 08/13/24 1112 08/14/24 0238  NA 141 136   < > 137 138 137 138 140  139 139  K 3.8 3.5   < > 3.7 3.6 3.8 3.5 3.2*  3.2* 3.6  CL 103 101   < > 102 100 100 101  --  101  CO2 24 21*   < > 21* 23 22 21*  --  21*  GLUCOSE 128* 235*   < > 249* 175* 242* 153*  --  195*  BUN 50* 57*   < > 64* 72* 80* 88*  --  93*  CREATININE 3.88* 4.41*   < > 4.95* 4.73* 5.09* 5.44*  --  5.76*  CALCIUM  8.4* 8.3*   < > 8.2* 8.4* 8.4* 8.3*  --  8.4*  MG 2.1 2.0  --   --  2.2 2.2  --   --  2.3  PHOS  --  4.6  --  5.0* 5.1* 5.4* 5.9*  --  6.5*  ALBUMIN   --  2.6*  --  2.7* 2.8* 2.8* 2.9*  --  3.0*   < > = values in this interval not displayed.    Liver Function Tests: Recent Labs  Lab 08/12/24 0232 08/13/24 0239 08/14/24 0238  ALBUMIN  2.8* 2.9* 3.0*   No results for input(s): LIPASE, AMYLASE in the last 168 hours. No results for input(s): AMMONIA in the last 168 hours. CBC: Recent Labs    08/07/24 0322 08/08/24 0559 08/09/24 0308 08/11/24 0221 08/13/24 1112  HGB 10.2* 10.6* 11.3* 12.0* 10.9*  11.2*  MCV 91.8 92.0 90.9 90.3  --   VITAMINB12 370  --   --   --   --   FERRITIN 116  --   --   --   --   TIBC 242*  --   --   --   --   IRON 28*  --   --   --   --     Cardiac Enzymes: No results for input(s): CKTOTAL, CKMB, CKMBINDEX, TROPONINI in the last 168 hours. CBG: Recent Labs  Lab 08/13/24 1218 08/13/24 1605 08/13/24 1717 08/13/24 2121 08/14/24 0605  GLUCAP 163* 225* 233* 203* 177*    Iron Studies: No results for input(s): IRON, TIBC, TRANSFERRIN, FERRITIN in the last 72  hours. Studies/Results: CARDIAC CATHETERIZATION Result Date: 08/13/2024 1. Mildly elevated PCWP. 2. 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 3. Normal RA pressure. 4. Normal cardiac output. 5. Good PAPi.    Medications: Infusions:    Scheduled Medications:  apixaban   5 mg Oral BID   atorvastatin   80 mg Oral QHS   carvedilol   25 mg Oral BID WC   dorzolamide -timolol   1 drop Both Eyes BID   guaiFENesin   600 mg Oral BID   hydrALAZINE   10 mg Oral Q8H   insulin  aspart  0-20 Units Subcutaneous TID WC   insulin  aspart  0-5 Units Subcutaneous QHS   insulin  aspart  6 Units Subcutaneous TID WC   insulin  glargine-yfgn  28 Units Subcutaneous QHS   latanoprost   1 drop Both Eyes QHS   lidocaine   1 patch Transdermal Q24H   pantoprazole   40 mg Oral Daily   senna  1 tablet Oral BID    have reviewed scheduled and prn medications.  Physical Exam: General:NAD, looks comfortable and able to lie flat Heart:RRR, s1s2 nl Lungs:clear b/l, no crackle Abdomen:soft, Non-tender, distended Extremities: Bilateral lower extremities edema present +with dressing on.  Neurology: Alert awake and following commands  Leroy Simmons Leroy Simmons 08/14/2024,11:53 AM  LOS: 7 days

## 2024-08-14 NOTE — Progress Notes (Signed)
 Mobility Specialist Progress Note:    08/14/24 1447  Mobility  Activity Ambulated with assistance  Level of Assistance Standby assist, set-up cues, supervision of patient - no hands on  Assistive Device None  Distance Ambulated (ft) 100 ft  Range of Motion/Exercises Active  Activity Response Tolerated well  Mobility Referral Yes  Mobility visit 1 Mobility  Mobility Specialist Start Time (ACUTE ONLY) 1447  Mobility Specialist Stop Time (ACUTE ONLY) 1453  Mobility Specialist Time Calculation (min) (ACUTE ONLY) 6 min   Received pt sleeping in bed agreeable to session. C/o fatigue and some light sore but otherwise doing well. Pt moving and ambulating slowly but decently. Returned pt to bed w/ all needs met and family in room.   Venetia Keel Mobility Specialist Please Neurosurgeon or Rehab Office at [phone]

## 2024-08-14 NOTE — Progress Notes (Signed)
 HD#7 SUBJECTIVE:  Patient Summary: Leroy Simmons is a 53 y.o. with a pertinent PMH of HFpEF and CKD4 who's being followed by IMTS, HF and nephro for hypervolemia likely due to diabetic nephropathy and nephrotic syndrome.   Overnight Events: itching, benadryl  Interim History: He's tired this morning. He was itchy last night especially around his neck and L leg where the unna boot is wrapped. Denies nausea or changes in appetite or changes in his ability to think. Peeing has been normal, not foamy. Bowels unchanged. Feels about the same except for the itchiness.   OBJECTIVE:  Vital Signs: Vitals:   08/14/24 0318 08/14/24 0319 08/14/24 0507 08/14/24 0828  BP: 137/82   132/88  Pulse: 75 74  75  Resp: 16   18  Temp: 97.8 F (36.6 C)   (!) 97.2 F (36.2 C)  TempSrc: Oral   Oral  SpO2: 99% 99%  97%  Weight:   105.8 kg   Height:       Supplemental O2: Room Air SpO2: 97 % O2 Flow Rate (L/min): 2 L/min  Filed Weights   08/12/24 0434 08/13/24 0546 08/14/24 0507  Weight: 107.1 kg 105.6 kg 105.8 kg     Intake/Output Summary (Last 24 hours) at 08/14/2024 1523 Last data filed at 08/14/2024 9167 Gross per 24 hour  Intake 360 ml  Output 750 ml  Net -390 ml   Net IO Since Admission: -9,487.85 mL [08/14/24 1523]  Physical Exam: Physical Exam Vitals reviewed.  Constitutional:      Appearance: He is well-developed.  HENT:     Mouth/Throat:     Pharynx: Oropharynx is clear.  Cardiovascular:     Rate and Rhythm: Normal rate and regular rhythm.     Pulses: Normal pulses.     Heart sounds: Normal heart sounds.  Pulmonary:     Effort: Pulmonary effort is normal.     Breath sounds: Decreased breath sounds present.  Abdominal:     Palpations: Abdomen is soft.  Musculoskeletal:     Right lower leg: No tenderness. Edema present.     Left lower leg: No tenderness. Edema present.     Comments: BL swelling is the same Unna boots and socks on  Skin:    General: Skin is warm.      Comments: Scratche marks over right neck around adhesive tap and EKG sticks, also scratches on abdomen and around proximal L unna boot  Neurological:     General: No focal deficit present.     Mental Status: He is alert and oriented to person, place, and time.  Psychiatric:        Mood and Affect: Mood normal.        Behavior: Behavior normal.     Patient Lines/Drains/Airways Status     Active Line/Drains/Airways     Name Placement date Placement time Site Days   Peripheral IV 08/13/24 20 G 1.88 Right;Anterior Forearm 08/13/24  0937  Forearm  1            Pertinent labs and imaging:      Latest Ref Rng & Units 08/13/2024   11:12 AM 08/11/2024    2:21 AM 08/09/2024    3:08 AM  CBC  WBC 4.0 - 10.5 K/uL  8.2  8.4   Hemoglobin 13.0 - 17.0 g/dL 86.9 - 82.9 g/dL 88.7    89.0  87.9  88.6   Hematocrit 39.0 - 52.0 % 39.0 - 52.0 % 33.0  32.0  35.3  32.8   Platelets 150 - 400 K/uL  372  335        Latest Ref Rng & Units 08/14/2024    2:38 AM 08/13/2024   11:12 AM 08/13/2024    2:39 AM  CMP  Glucose 70 - 99 mg/dL 804   846   BUN 6 - 20 mg/dL 93   88   Creatinine 9.38 - 1.24 mg/dL 4.23   4.55   Sodium 864 - 145 mmol/L 139  139    140  138   Potassium 3.5 - 5.1 mmol/L 3.6  3.2    3.2  3.5   Chloride 98 - 111 mmol/L 101   101   CO2 22 - 32 mmol/L 21   21   Calcium  8.9 - 10.3 mg/dL 8.4   8.3     No results found.   ASSESSMENT/PLAN:  Assessment: Principal Problem:   Acute on chronic diastolic heart failure with preserved ejection fraction (HCC) Active Problems:   Uncontrolled type 2 diabetes mellitus with hyperglycemia, with long-term current use of insulin  (HCC)   Anemia of chronic disease   History of DVT (deep vein thrombosis), LLE 2024   Chronic kidney disease (CKD), stage IV (severe) (HCC)   History of pulmonary embolism, LLL 01/2024   Peripheral edema   CKD (chronic kidney disease) stage 5, GFR less than 15 ml/min (HCC)   Plan: CKD4 Nephrotic Range  Proteinuria AGMA Baseline Cr ~3. Cr increase to 5.76 this AM, BUN 93. Suspect creatinine elevation is due to recent entresto use and possibly from high dose diuretics. Aside from itch, does not have uremic symptoms and no need for urgent dialysis. Nephro continuing to hold diuretics.   -Nephro consulted, appreciate recommendations  - renal diet with salt and fluid restriction - Unna boots, encourage elevation   Acute on Chronic HFpEF LVEF 60-65% 08/2024 NYHA Class III Stage C UOP recorded at - mL in the past 24 hours, down ~12lbs since admission. RHC c/w euvolemia.  -strict ins&outs -Magnesium  is 2.3, no replacement required today  -GDMT: Coreg  25 mg BID, limited due to rising Cr   Hypertension  BP 137/82 this morning.  -Hydralazine  10 mg TID, carvedilol  25 mg BID   Diabetes mellitus on chronic insulin  use A1c this admission 7.2. FBG this AM 177.  - Semglee  to 28 units  - 6 units TID with meals - Resistant SSI and at bedtime coverage   Renal Osteodystrophy Secondary Hyperparathyroidism PTH 112, vitamin D 25 15.30. Continue to monitor Ca and phos. Phos 6.5. Nephro started sevelamer today -sevelamer 800 mg TID -RFP in AM   CAD Hyperlipidemia Left heart cath 03/2022 that revealed 50% D2, 40% mid LAD, and 25% ramus lesions.  - atorvastatin  80 mg daily - Carvedilol  25 mg bid   Hypercoagulable, VTE Hx BL Vas US  c/w subQ edema and w/o evidence of DVT - Eliquis  5 mg BID   Normocytic anemia with iron deficiency Hgb 11.5 on admission. -CBC AM   OSA suspected Patient awaiting sleep studies.  - CPAP at night   Asthma - PRN albuterol    GERD - pantoprazole  40 mg daily  Best Practice: Diet: Renal diet IVF: Fluids: none, Rate: None VTE: SCDs Start: 08/06/24 1014 Code: Full  Disposition planning: Therapy Recs: None, DME: none Family Contact: [place], to be notified. DISPO: Anticipated discharge in 1-2 days to Home pending clinical improvement.  Signature:  Viktoria Charmayne [facility]  3:23 PM, 08/14/2024  On Call pager [phone]

## 2024-08-14 NOTE — Progress Notes (Signed)
 Occupational Therapy Treatment & Discharge Patient Details Name: Leroy Simmons MRN: [id number] DOB: Jan 16, 1971 Today's Date: 08/14/2024   History of present illness Pt is a 53 y.o male admitted 11/4 for BLE edema. Acute on chronic diastolic HF, and acute hypoxic respiratory failure. PMH: CHF, anxiety, depression, T2DM, HTN,  laparoscopic appendectomy 12/23 due to gangrenous appendicitis, NSTEMI, AKI, DVT, CAD, PE   OT comments  Progressed to tolerate 10 minutes of standing ADLs and OOB activity at mod I. Educated and provided pt with handout for energy conservation strategies as well as visual perceptual activities to improve depth perception. All goals met, will d/c from OT services. Encouraged continued mobilization with mobility team.       If plan is discharge home, recommend the following:  A little help with walking and/or transfers;A little help with bathing/dressing/bathroom;Assistance with cooking/housework   Equipment Recommendations  None recommended by OT       Precautions / Restrictions Precautions Precautions: None Recall of Precautions/Restrictions: Intact Restrictions Weight Bearing Restrictions Per Provider Order: No       Mobility Bed Mobility Overal bed mobility: Modified Independent      Transfers Overall transfer level: Modified independent Equipment used: Rolling walker (2 wheels) Transfers: Sit to/from Stand Sit to Stand: Modified independent (Device/Increase time)           General transfer comment: Mod I     Balance Overall balance assessment: Mild deficits observed, not formally tested       ADL either performed or assessed with clinical judgement   ADL Overall ADL's : Modified independent       General ADL Comments: Completed standing grooming and functional mobility    Extremity/Trunk Assessment Upper Extremity Assessment Upper Extremity Assessment: Generalized weakness   Lower Extremity Assessment Lower Extremity  Assessment: Defer to PT evaluation        Vision   Vision Assessment?: No apparent visual deficits         Communication Communication Communication: No apparent difficulties   Cognition Arousal: Alert Behavior During Therapy: WFL for tasks assessed/performed Cognition: No apparent impairments     Following commands: Intact        Cueing   Cueing Techniques: Verbal cues        General Comments Access Code: [email or web address]  URL: [email or web address]  Date: 08/14/2024  Prepared by: Adrianne Savers    Patient Education  - Understanding Energy Conservation  - Visual Spatial Relations    Pertinent Vitals/ Pain       Pain Assessment Pain Assessment: 0-10 Pain Score: 7  Pain Location: RLE Pain Descriptors / Indicators: Discomfort Pain Intervention(s): Limited activity within patient's tolerance   Frequency  Min 2X/week        Progress Toward Goals  OT Goals(current goals can now be found in the care plan section)  Progress towards OT goals: Goals met/education completed, patient discharged from OT  Acute Rehab OT Goals Patient Stated Goal: To be d/c OT Goal Formulation: All assessment and education complete, DC therapy Time For Goal Achievement: 08/21/24 Potential to Achieve Goals: Good ADL Goals Pt Will Perform Grooming: with modified independence;standing Pt Will Perform Lower Body Dressing: with modified independence;sit to/from stand Pt Will Transfer to Toilet: with modified independence;ambulating;regular height toilet Pt Will Perform Toileting - Clothing Manipulation and hygiene: with modified independence;sit to/from stand;sitting/lateral leans Additional ADL Goal #1: Pt will verbalize at least 3 energy conservation strategies for ADLs Additional ADL Goal #2: Pt will tolerate at least 10 minutes of standing ADLs at  mod I to increase activity tolerance  Plan         AM-PAC OT 6 Clicks Daily Activity     Outcome Measure   Help from another  person eating meals?: None Help from another person taking care of personal grooming?: None Help from another person toileting, which includes using toliet, bedpan, or urinal?: None Help from another person bathing (including washing, rinsing, drying)?: None Help from another person to put on and taking off regular upper body clothing?: None Help from another person to put on and taking off regular lower body clothing?: None 6 Click Score: 24    End of Session Equipment Utilized During Treatment: Rolling walker (2 wheels)  OT Visit Diagnosis: Unsteadiness on feet (R26.81);Other abnormalities of gait and mobility (R26.89);Muscle weakness (generalized) (M62.81);History of falling (Z91.81)   Activity Tolerance Patient tolerated treatment well   Patient Left with call bell/phone within reach   Nurse Communication Mobility status        Time: 1319-1340 OT Time Calculation (min): 21 min  Charges: OT General Charges $OT Visit: 1 Visit OT Treatments $Self Care/Home Management : 8-22 mins  Adrianne BROCKS, OT  Acute Rehabilitation Services Office [phone] Secure chat preferred   Adrianne GORMAN Savers 08/14/2024, 1:51 PM

## 2024-08-15 DIAGNOSIS — N185 Chronic kidney disease, stage 5: Secondary | ICD-10-CM | POA: Diagnosis not present

## 2024-08-15 DIAGNOSIS — N179 Acute kidney failure, unspecified: Secondary | ICD-10-CM | POA: Diagnosis not present

## 2024-08-15 DIAGNOSIS — I5033 Acute on chronic diastolic (congestive) heart failure: Secondary | ICD-10-CM | POA: Diagnosis not present

## 2024-08-15 LAB — CBC
HCT: 34.9 % — ABNORMAL LOW (ref 39.0–52.0)
Hemoglobin: 11.9 g/dL — ABNORMAL LOW (ref 13.0–17.0)
MCH: 30.6 pg (ref 26.0–34.0)
MCHC: 34.1 g/dL (ref 30.0–36.0)
MCV: 89.7 fL (ref 80.0–100.0)
Platelets: 313 K/uL (ref 150–400)
RBC: 3.89 MIL/uL — ABNORMAL LOW (ref 4.22–5.81)
RDW: 11.8 % (ref 11.5–15.5)
WBC: 8.5 K/uL (ref 4.0–10.5)
nRBC: 0 % (ref 0.0–0.2)

## 2024-08-15 LAB — RENAL FUNCTION PANEL
Albumin: 3.1 g/dL — ABNORMAL LOW (ref 3.5–5.0)
Anion gap: 15 (ref 5–15)
BUN: 104 mg/dL — ABNORMAL HIGH (ref 6–20)
CO2: 20 mmol/L — ABNORMAL LOW (ref 22–32)
Calcium: 8.4 mg/dL — ABNORMAL LOW (ref 8.9–10.3)
Chloride: 102 mmol/L (ref 98–111)
Creatinine, Ser: 5.55 mg/dL — ABNORMAL HIGH (ref 0.61–1.24)
GFR, Estimated: 12 mL/min — ABNORMAL LOW (ref >60)
Glucose, Bld: 219 mg/dL — ABNORMAL HIGH (ref 70–99)
Phosphorus: 5.9 mg/dL — ABNORMAL HIGH (ref 2.5–4.6)
Potassium: 3.4 mmol/L — ABNORMAL LOW (ref 3.5–5.1)
Sodium: 137 mmol/L (ref 135–145)

## 2024-08-15 LAB — GLUCOSE, CAPILLARY
Glucose-Capillary: 217 mg/dL — ABNORMAL HIGH (ref 70–99)
Glucose-Capillary: 159 mg/dL — ABNORMAL HIGH (ref 70–99)
Glucose-Capillary: 189 mg/dL — ABNORMAL HIGH (ref 70–99)
Glucose-Capillary: 153 mg/dL — ABNORMAL HIGH (ref 70–99)

## 2024-08-15 LAB — MAGNESIUM: Magnesium: 2.4 mg/dL (ref 1.7–2.4)

## 2024-08-15 MED ORDER — INSULIN GLARGINE-YFGN 100 UNIT/ML ~~LOC~~ SOLN
32.0000 [IU] | Freq: Every day | SUBCUTANEOUS | Status: DC
  Administered 2024-08-15: 32 [IU] via SUBCUTANEOUS
  Filled 2024-08-15 (×2): qty 0.32

## 2024-08-15 MED ORDER — HYDROXYZINE HCL 25 MG PO TABS
25.0000 mg | ORAL_TABLET | Freq: Three times a day (TID) | ORAL | Status: DC | PRN
Start: 2024-08-15 — End: 2024-08-16
  Administered 2024-08-15 (×3): 25 mg via ORAL
  Filled 2024-08-15 (×3): qty 1

## 2024-08-15 MED ORDER — POTASSIUM CHLORIDE CRYS ER 20 MEQ PO TBCR
40.0000 meq | EXTENDED_RELEASE_TABLET | Freq: Once | ORAL | Status: AC
  Administered 2024-08-15: 40 meq via ORAL
  Filled 2024-08-15: qty 2

## 2024-08-15 NOTE — Progress Notes (Addendum)
 [facility] NEPHROLOGY PROGRESS NOTE  Assessment/ Plan: Pt is a 53 y.o. yo male with past medical history significant for hypertension, type 2 diabetes, PE, CKD 4, CHF who was admitted for acute on chronic CHF and AKI.    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  Acute change in GFR likely hemodynamically mediated caused by Entresto, diuretics.  He was treated with IV diuretics with significant improvement of heart failure.   - RHC on 11/11 with euvolemia.  The creatinine level seems to have plateaued but BUN is trending up.  I have discussed with the patient that he may need dialysis if no improvement in renal function.  Fortunately, he has no sign of uremia and no urgent need for dialysis.  Hoping to avoid dialysis during this hospitalization. -Will keep him at least 1 more day to monitor his kidney function.  Meantime continue strict ins and out.  # HFpEF LVEF 60%: Treated with diuretics which is on hold now because of AKI.  Continue to hold Entresto.  Seen by cardiologist.  # CKD stage IV: Thought to be due to diabetic nephropathy and was seen by [facility] in the past and then lost to follow-up.  Reportedly the baseline creatinine level is around 3.  #Hypertension: Monitor BP with diuresis.  # CKD-MBD/secondary hyperparathyroidism/hyperphosphatemia: PTH level 112 acceptable for CKD.  Started sevelamer for hyperphosphatemia.  Monitor calcium , phosphorus.  #Normocytic Anemia -TSAT 12% F116 -> already given Venofer 500mg . Hb improved.  # Hypokalemia: Replete potassium chloride .  Follow lab.  Discussed with the primary team as well as patient's partner who was present at the bedside.  Subjective: Seen and examined at the bedside.  Urine output is around a liter.  He denies nausea, vomiting, dysgeusia, chest pain or shortness of breath.  Anxious about low kidney function and hoping to avoid dialysis. . Objective Vital signs in last 24 hours: Vitals:   08/14/24  1941 08/14/24 2340 08/15/24 0400 08/15/24 0856  BP: (!) 157/89 (!) 153/93 (!) 152/94 135/82  Pulse: 74 74 75 76  Resp: 18 18 19 18   Temp: (!) 97.4 F (36.3 C) 98.2 F (36.8 C) 98.2 F (36.8 C) 98.2 F (36.8 C)  TempSrc: Oral Oral Oral Oral  SpO2: 98% 98% 96% 96%  Weight:   104.7 kg   Height:       Weight change: -1.065 kg  Intake/Output Summary (Last 24 hours) at 08/15/2024 1032 Last data filed at 08/15/2024 0200 Gross per 24 hour  Intake 360 ml  Output 600 ml  Net -240 ml       Labs: RENAL PANEL Recent Labs  Lab 08/09/24 0308 08/09/24 1647 08/11/24 0221 08/12/24 0232 08/13/24 0239 08/13/24 1112 08/14/24 0238 08/15/24 0259  NA 136   < > 138 137 138 140  139 139 137  K 3.5   < > 3.6 3.8 3.5 3.2*  3.2* 3.6 3.4*  CL 101   < > 100 100 101  --  101 102  CO2 21*   < > 23 22 21*  --  21* 20*  GLUCOSE 235*   < > 175* 242* 153*  --  195* 219*  BUN 57*   < > 72* 80* 88*  --  93* 104*  CREATININE 4.41*   < > 4.73* 5.09* 5.44*  --  5.76* 5.55*  CALCIUM  8.3*   < > 8.4* 8.4* 8.3*  --  8.4* 8.4*  MG 2.0  --  2.2 2.2  --   --  2.3 2.4  PHOS 4.6   < > 5.1* 5.4* 5.9*  --  6.5* 5.9*  ALBUMIN  2.6*   < > 2.8* 2.8* 2.9*  --  3.0* 3.1*   < > = values in this interval not displayed.    Liver Function Tests: Recent Labs  Lab 08/13/24 0239 08/14/24 0238 08/15/24 0259  ALBUMIN  2.9* 3.0* 3.1*   No results for input(s): LIPASE, AMYLASE in the last 168 hours. No results for input(s): AMMONIA in the last 168 hours. CBC: Recent Labs    08/07/24 0322 08/08/24 0559 08/09/24 0308 08/11/24 0221 08/13/24 1112 08/15/24 0259  HGB 10.2* 10.6* 11.3* 12.0* 10.9*  11.2* 11.9*  MCV 91.8 92.0 90.9 90.3  --  89.7  VITAMINB12 370  --   --   --   --   --   FERRITIN 116  --   --   --   --   --   TIBC 242*  --   --   --   --   --   IRON 28*  --   --   --   --   --     Cardiac Enzymes: No results for input(s): CKTOTAL, CKMB, CKMBINDEX, TROPONINI in the last 168  hours. CBG: Recent Labs  Lab 08/14/24 0605 08/14/24 1124 08/14/24 1653 08/14/24 2050 08/15/24 0613  GLUCAP 177* 185* 173* 226* 217*    Iron Studies: No results for input(s): IRON, TIBC, TRANSFERRIN, FERRITIN in the last 72 hours. Studies/Results: CARDIAC CATHETERIZATION Result Date: 08/13/2024 1. Mildly elevated PCWP. 2. 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 3. Normal RA pressure. 4. Normal cardiac output. 5. Good PAPi.    Medications: Infusions:    Scheduled Medications:  apixaban   5 mg Oral BID   atorvastatin   80 mg Oral QHS   carvedilol   25 mg Oral BID WC   dorzolamide -timolol   1 drop Both Eyes BID   guaiFENesin   600 mg Oral BID   hydrALAZINE   10 mg Oral Q8H   insulin  aspart  0-20 Units Subcutaneous TID WC   insulin  aspart  0-5 Units Subcutaneous QHS   insulin  aspart  6 Units Subcutaneous TID WC   insulin  glargine-yfgn  28 Units Subcutaneous QHS   latanoprost   1 drop Both Eyes QHS   lidocaine   1 patch Transdermal Q24H   pantoprazole   40 mg Oral Daily   senna  1 tablet Oral BID   sevelamer carbonate  800 mg Oral TID WC    have reviewed scheduled and prn medications.  Physical Exam: General:NAD, looks comfortable and able to lie flat Heart:RRR, s1s2 nl Lungs:clear b/l, no crackle Abdomen:soft, Non-tender, distended Extremities: Bilateral lower extremities dependent edema, stable.  dressing on.  Neurology: Alert awake and following commands  Leroy Simmons Leroy Simmons 08/15/2024,10:32 AM  LOS: 8 days

## 2024-08-15 NOTE — Progress Notes (Signed)
 Orthopedic Tech Progress Note Patient Details:  Leroy Simmons [birth year] [id number]  Ortho Devices Type of Ortho Device: Radio broadcast assistant Ortho Device/Splint Location: BLE Ortho Device/Splint Interventions: Ordered, Application, Adjustment   Post Interventions Patient Tolerated: Well Instructions Provided: Care of device  Mc Hollen L [name] 08/15/2024, 8:40 PM

## 2024-08-15 NOTE — Progress Notes (Signed)
 Initial Nutrition Assessment  DOCUMENTATION CODES:   Obesity unspecified  INTERVENTION:  Continue renal diet with 1.2 L fluid restriction. Renal/low-sodium diet education completed on 11/8.   NUTRITION DIAGNOSIS:   Other (Comment) (Limited nutrition-related knowledge) related to chronic illness (CKD) as evidenced by  (need for diet education.).   GOAL:   Patient will meet greater than or equal to 90% of their needs   MONITOR:   PO intake, Weight trends  REASON FOR ASSESSMENT:   LOS    ASSESSMENT:   Patient presented with shortness of breath and chest pain, and was found to have acute on chronic CHF with respiratory failure . PMH significant for DM2, HTN, CKD4, CHF, CAD, PE/DVT, depression, gangrenous appendicitis 09/2022, and homelessness living in car.   The patient is at low threshold for HD initiation. Visited the patient who denied any questions. He tells me he continues to eat very well.   Scheduled Meds:  apixaban   5 mg Oral BID   atorvastatin   80 mg Oral QHS   carvedilol   25 mg Oral BID WC   dorzolamide -timolol   1 drop Both Eyes BID   guaiFENesin   600 mg Oral BID   hydrALAZINE   10 mg Oral Q8H   insulin  aspart  0-20 Units Subcutaneous TID WC   insulin  aspart  0-5 Units Subcutaneous QHS   insulin  aspart  6 Units Subcutaneous TID WC   insulin  glargine-yfgn  28 Units Subcutaneous QHS   latanoprost   1 drop Both Eyes QHS   lidocaine   1 patch Transdermal Q24H   pantoprazole   40 mg Oral Daily   senna  1 tablet Oral BID   sevelamer carbonate  800 mg Oral TID WC    Diet Order             Diet renal with fluid restriction Fluid restriction: 1200 mL Fluid; Room service appropriate? Yes; Fluid consistency: Thin  Diet effective now                  Meal Intake: 100%  Labs:     Latest Ref Rng & Units 08/15/2024    2:59 AM 08/14/2024    2:38 AM 08/13/2024   11:12 AM  CMP  Glucose 70 - 99 mg/dL 780  804    BUN 6 - 20 mg/dL 895  93    Creatinine  0.61 - 1.24 mg/dL 4.44  4.23    Sodium 864 - 145 mmol/L 137  139  139    140   Potassium 3.5 - 5.1 mmol/L 3.4  3.6  3.2    3.2   Chloride 98 - 111 mmol/L 102  101    CO2 22 - 32 mmol/L 20  21    Calcium  8.9 - 10.3 mg/dL 8.4  8.4        I/O: -9.5 L since admit  NUTRITION - FOCUSED PHYSICAL EXAM:  Flowsheet Row Most Recent Value  Orbital Region No depletion  Upper Arm Region No depletion  Thoracic and Lumbar Region No depletion  Buccal Region No depletion  Temple Region No depletion  Clavicle Bone Region Mild depletion  Clavicle and Acromion Bone Region No depletion  Scapular Bone Region No depletion  Dorsal Hand No depletion  Patellar Region No depletion  Anterior Thigh Region No depletion  Posterior Calf Region No depletion  Edema (RD Assessment) Moderate  Hair Reviewed  Eyes Reviewed  Mouth Reviewed  Skin Reviewed  Nails Reviewed    EDUCATION NEEDS:   Education needs have  been addressed  Skin:  Skin Assessment: Reviewed RN Assessment  Last BM:  11/12  Height:   Ht Readings from Last 1 Encounters:  08/11/24 5' 8 (1.727 m)    Weight:    Weight Change: gradual weight loss this admission from diuresing   Edema: +2 BLE  Ideal Body Weight:  70 kg   BMI:  Body mass index is 35.11 kg/m.  Estimated Nutritional Needs:  Kcal:  1900-2200 Protein:  100-120 Fluid:  1.2 L fluid restriction noted    Leverne Ruth, MS, RDN, LDN [place]. [facility] See AMION for contact information

## 2024-08-15 NOTE — Plan of Care (Signed)
   Problem: Coping: Goal: Ability to adjust to condition or change in health will improve Outcome: Progressing

## 2024-08-15 NOTE — Inpatient Diabetes Management (Signed)
 Inpatient Diabetes Program Recommendations  AACE/ADA: New Consensus Statement on Inpatient Glycemic Control (2015)  Target Ranges:  Prepandial:   less than 140 mg/dL      Peak postprandial:   less than 180 mg/dL (1-2 hours)      Critically ill patients:  140 - 180 mg/dL   Lab Results  Component Value Date   GLUCAP 159 (H) 08/15/2024   HGBA1C 7.2 (H) 08/06/2024    Review of Glycemic Control  Latest Reference Range & Units 08/14/24 06:05 08/14/24 11:24 08/14/24 16:53 08/14/24 20:50 08/15/24 06:13 08/15/24 12:12  Glucose-Capillary 70 - 99 mg/dL 822 (H) 814 (H) 826 (H) 226 (H) 217 (H) 159 (H)   Diabetes history: DM 2 Outpatient Diabetes medications:  FSL3 Toujeo  52 units daily Humalog  8 units tid with meals  Ozempic- not takng Current orders for Inpatient glycemic control:  Novolog  0-20 units tid with meals and HS Novolog  6 units tid with meals  Semglee  28 units daily Inpatient Diabetes Program Recommendations:   Consider increasing Semglee  to 32 units daily.   Thanks,  Randall Bullocks, RN, BC-ADM Inpatient Diabetes Coordinator Pager [phone]  (8a-5p)

## 2024-08-15 NOTE — Progress Notes (Signed)
 Mobility Specialist Progress Note:    08/15/24 1312  Mobility  Activity Ambulated with assistance  Level of Assistance Standby assist, set-up cues, supervision of patient - no hands on  Assistive Device Front wheel walker  Distance Ambulated (ft) 400 ft  Range of Motion/Exercises Active  Activity Response Tolerated well  Mobility Referral Yes  Mobility visit 1 Mobility  Mobility Specialist Start Time (ACUTE ONLY) 1312  Mobility Specialist Stop Time (ACUTE ONLY) 1320  Mobility Specialist Time Calculation (min) (ACUTE ONLY) 8 min   Received pt sitting EOB agreeable to session. No c/o any symptoms. Pt able to move and ambulate well w/ RW. Pt feeling better compared to other session. Returned pt to room w/ all needs met.   Venetia Keel Mobility Specialist Please Neurosurgeon or Rehab Office at [phone]

## 2024-08-15 NOTE — Progress Notes (Signed)
 HD#8 SUBJECTIVE:  Patient Summary:  Leroy Simmons is a 53 y.o. with a pertinent PMH of HFpEF and CKD4 who's being followed by IMTS, HF and nephro for hypervolemia likely due to diabetic nephropathy and nephrotic syndrome.   Overnight Events: none  Interim History: He didn't sleep well because he was itchy, the cream is helping but its short lived. Denies chest pain, nausea, metallic taste or abdominal pain. Appetite is as good as its been in a long time. Feels like his breathing during his walks has improved throughout admission. Doesn't have generalized itch, mostly just under the EKG leads and tape. Discussed plan to try hydroxyzine  and continue with the cream. Also, will coordinate with nephro team about next steps. His partner would like to discuss kidney donation.   OBJECTIVE:  Vital Signs: Vitals:   08/14/24 1941 08/14/24 2340 08/15/24 0400 08/15/24 0856  BP: (!) 157/89 (!) 153/93 (!) 152/94 135/82  Pulse: 74 74 75 76  Resp: 18 18 19 18   Temp: (!) 97.4 F (36.3 C) 98.2 F (36.8 C) 98.2 F (36.8 C) 98.2 F (36.8 C)  TempSrc: Oral Oral Oral Oral  SpO2: 98% 98% 96% 96%  Weight:   104.7 kg   Height:       Supplemental O2: Room Air SpO2: 96 % O2 Flow Rate (L/min): 2 L/min  Filed Weights   08/13/24 0546 08/14/24 0507 08/15/24 0400  Weight: 105.6 kg 105.8 kg 104.7 kg     Intake/Output Summary (Last 24 hours) at 08/15/2024 1007 Last data filed at 08/15/2024 0200 Gross per 24 hour  Intake 360 ml  Output 600 ml  Net -240 ml   Net IO Since Admission: -9,727.85 mL [08/15/24 1007]  Physical Exam: Physical Exam Vitals reviewed.  Constitutional:      Appearance: He is not ill-appearing.  HENT:     Head: Normocephalic and atraumatic.  Eyes:     Extraocular Movements: Extraocular movements intact.  Cardiovascular:     Rate and Rhythm: Normal rate and regular rhythm.     Pulses: Normal pulses.     Heart sounds: Normal heart sounds.  Pulmonary:     Effort: Pulmonary  effort is normal.  Abdominal:     General: Bowel sounds are normal.  Musculoskeletal:     Right lower leg: No tenderness.     Left lower leg: No tenderness.     Comments: BL LE swelling much improved, unna boots and socks were off today BL LE warm with +PT pulses Pitting edema, BL feet still have dome shaped swelling but improved   Skin:    General: Skin is warm.     Findings: Rash present.     Comments: Well defined eczematous rash under tape on right neck  Neurological:     General: No focal deficit present.     Mental Status: He is alert and oriented to person, place, and time.  Psychiatric:        Mood and Affect: Mood normal.        Behavior: Behavior normal.     Patient Lines/Drains/Airways Status     Active Line/Drains/Airways     Name Placement date Placement time Site Days   Peripheral IV 08/13/24 20 G 1.88 Right;Anterior Forearm 08/13/24  0937  Forearm  2            Pertinent labs and imaging:      Latest Ref Rng & Units 08/15/2024    2:59 AM 08/13/2024   11:12 AM  08/11/2024    2:21 AM  CBC  WBC 4.0 - 10.5 K/uL 8.5   8.2   Hemoglobin 13.0 - 17.0 g/dL 88.0  88.7    89.0  87.9   Hematocrit 39.0 - 52.0 % 34.9  33.0    32.0  35.3   Platelets 150 - 400 K/uL 313   372        Latest Ref Rng & Units 08/15/2024    2:59 AM 08/14/2024    2:38 AM 08/13/2024   11:12 AM  CMP  Glucose 70 - 99 mg/dL 780  804    BUN 6 - 20 mg/dL 895  93    Creatinine 9.38 - 1.24 mg/dL 4.44  4.23    Sodium 864 - 145 mmol/L 137  139  139    140   Potassium 3.5 - 5.1 mmol/L 3.4  3.6  3.2    3.2   Chloride 98 - 111 mmol/L 102  101    CO2 22 - 32 mmol/L 20  21    Calcium  8.9 - 10.3 mg/dL 8.4  8.4      No results found.  ASSESSMENT/PLAN:  Assessment: Principal Problem:   Acute on chronic diastolic heart failure with preserved ejection fraction (HCC) Active Problems:   Uncontrolled type 2 diabetes mellitus with hyperglycemia, with long-term current use of insulin  (HCC)    Anemia of chronic disease   History of DVT (deep vein thrombosis), LLE 2024   Chronic kidney disease (CKD), stage IV (severe) (HCC)   History of pulmonary embolism, LLL 01/2024   Peripheral edema   CKD (chronic kidney disease), stage V (HCC)   Plan: CKD4 Nephrotic Range Proteinuria AGMA Baseline Cr ~3. Cr decrease to 5.55, BUN still rising 104 today. Volume status has improved dramatically since admission, total UOP this admission ~10L. Does not appear to have signs of uremia, his itching is localized to areas of contact with tape and unna boots, likely contact derm, will continue hydrocortisone-pramoxine cream and hydroxyzine  PO. Awaiting nephro recommendations before discharging to home.  - renal diet with salt and fluid restriction - Unna boots, encourage elevation    Acute on Chronic HFpEF LVEF 60-65% 08/2024 NYHA Class III Stage C UOP recorded at -240 mL in the past 24 hours, down ~14lbs since admission. RHC c/w euvolemia.  -strict ins&outs -Magnesium  is 2.4, no replacement required today  -GDMT: Coreg  25 mg BID, limited due to rising Cr   Hypertension  BP 152/94 this morning.  -Hydralazine  10 mg TID, carvedilol  25 mg BID   Diabetes mellitus on chronic insulin  use A1c this admission 7.2. FBG this AM 217.  - Semglee  to 28 units  - 6 units TID with meals - Resistant SSI and at bedtime coverage   Renal Osteodystrophy Secondary Hyperparathyroidism PTH 112, vitamin D 25 15.30. Continue to monitor Ca and phos. Phos 6.5>5.9 today -sevelamer 800 mg TID -RFP in AM   CAD Hyperlipidemia Left heart cath 03/2022 that revealed 50% D2, 40% mid LAD, and 25% ramus lesions.  - atorvastatin  80 mg daily - Carvedilol  25 mg bid   Hypercoagulable, VTE Hx - Eliquis  5 mg BID   Normocytic anemia with iron deficiency Hgb 11.5 on admission. 11.9 today and stable.    OSA suspected - CPAP at night, although has not been wearing    Asthma - PRN albuterol    GERD - pantoprazole  40 mg  daily  Best Practice: Diet: Renal diet IVF: Fluids: none VTE: SCDs Start: 08/06/24 1014, home apixaban   Code: Full  Disposition planning: Therapy Recs: None, DME: none Family Contact: Blease, at bedside. DISPO: Anticipated discharge tomorrow to Home pending medical mgmt.  Signature:  Viktoria Charmayne [facility]  10:07 AM, 08/15/2024  On Call pager [phone]

## 2024-08-15 NOTE — TOC Progression Note (Addendum)
 Transition of Care [facility]) - Progression Note    Patient Details  Name: Leroy Simmons MRN: [id number] Date of Birth: 1970-11-02  [facility]) CM/SW Contact  Luise JAYSON Pan, [place] Phone Number: 08/15/2024, 12:47 PM  Clinical Narrative:   CSW went to provide patient with low income housing listings at bedside. Patient asleep upon entry. CSW to follow up at a later time.   2:19 PM CSW provided patient with housing listings. Patient accepted the list and stated his friend will assist him in going through the list.    Expected Discharge Plan:  (Living in car) Barriers to Discharge: Continued Medical Work up               Expected Discharge Plan and Services                                               Social Drivers of Health (SDOH) Interventions SDOH Screenings   Food Insecurity: Food Insecurity Present (08/06/2024)  Housing: High Risk (08/06/2024)  Transportation Needs: No Transportation Needs (08/06/2024)  Utilities: At Risk (08/06/2024)  Alcohol Screen: Low Risk  (01/20/2023)  Depression (PHQ2-9): High Risk (06/01/2023)  Financial Resource Strain: Low Risk  (01/20/2023)  Social Connections: Socially Isolated (08/06/2024)  Tobacco Use: Low Risk  (08/06/2024)  Recent Concern: Tobacco Use - Medium Risk (06/10/2024)   Received from [facility]    Readmission Risk Interventions    01/19/2024    2:29 PM 03/22/2023   11:38 AM 01/23/2023    3:20 PM  Readmission Risk Prevention Plan  Transportation Screening Complete Complete Complete  PCP or Specialist Appt within 3-5 Days   Complete  HRI or Home Care Consult   --  Social Work Consult for Recovery Care Planning/Counseling   --  Palliative Care Screening   Not Applicable  Medication Review Oceanographer) Complete Complete Complete  PCP or Specialist appointment within 3-5 days of discharge  Complete   HRI or Home Care Consult Complete Complete   SW Recovery Care/Counseling Consult Complete  Complete   Palliative Care Screening Not Applicable Not Applicable   Skilled Nursing Facility Not Applicable Not Applicable

## 2024-08-16 ENCOUNTER — Other Ambulatory Visit (HOSPITAL_COMMUNITY): Payer: Self-pay

## 2024-08-16 DIAGNOSIS — I5033 Acute on chronic diastolic (congestive) heart failure: Secondary | ICD-10-CM | POA: Diagnosis not present

## 2024-08-16 DIAGNOSIS — N179 Acute kidney failure, unspecified: Secondary | ICD-10-CM | POA: Diagnosis not present

## 2024-08-16 DIAGNOSIS — N185 Chronic kidney disease, stage 5: Secondary | ICD-10-CM | POA: Diagnosis not present

## 2024-08-16 LAB — MAGNESIUM: Magnesium: 2.2 mg/dL (ref 1.7–2.4)

## 2024-08-16 LAB — RENAL FUNCTION PANEL
Albumin: 2.7 g/dL — ABNORMAL LOW (ref 3.5–5.0)
Anion gap: 13 (ref 5–15)
BUN: 106 mg/dL — ABNORMAL HIGH (ref 6–20)
CO2: 20 mmol/L — ABNORMAL LOW (ref 22–32)
Calcium: 8.3 mg/dL — ABNORMAL LOW (ref 8.9–10.3)
Chloride: 103 mmol/L (ref 98–111)
Creatinine, Ser: 5.61 mg/dL — ABNORMAL HIGH (ref 0.61–1.24)
GFR, Estimated: 12 mL/min — ABNORMAL LOW (ref >60)
Glucose, Bld: 191 mg/dL — ABNORMAL HIGH (ref 70–99)
Phosphorus: 5.9 mg/dL — ABNORMAL HIGH (ref 2.5–4.6)
Potassium: 3.7 mmol/L (ref 3.5–5.1)
Sodium: 136 mmol/L (ref 135–145)

## 2024-08-16 LAB — GLUCOSE, CAPILLARY: Glucose-Capillary: 161 mg/dL — ABNORMAL HIGH (ref 70–99)

## 2024-08-16 MED ORDER — SEVELAMER CARBONATE 800 MG PO TABS
800.0000 mg | ORAL_TABLET | Freq: Three times a day (TID) | ORAL | 0 refills | Status: AC
  Filled 2024-08-16: qty 90, 30d supply, fill #0

## 2024-08-16 MED ORDER — CARVEDILOL 25 MG PO TABS
25.0000 mg | ORAL_TABLET | Freq: Two times a day (BID) | ORAL | 0 refills | Status: AC
  Filled 2024-08-16: qty 60, 30d supply, fill #0

## 2024-08-16 MED ORDER — FUROSEMIDE 40 MG PO TABS
40.0000 mg | ORAL_TABLET | Freq: Every day | ORAL | 0 refills | Status: AC
  Filled 2024-08-16: qty 30, 30d supply, fill #0

## 2024-08-16 MED ORDER — TOUJEO MAX SOLOSTAR 300 UNIT/ML ~~LOC~~ SOPN
32.0000 [IU] | PEN_INJECTOR | Freq: Every day | SUBCUTANEOUS | 2 refills | Status: AC
  Filled 2024-08-16: qty 9, 84d supply, fill #0

## 2024-08-16 MED ORDER — INSULIN LISPRO (1 UNIT DIAL) 100 UNIT/ML (KWIKPEN)
6.0000 [IU] | PEN_INJECTOR | Freq: Three times a day (TID) | SUBCUTANEOUS | 2 refills | Status: AC
  Filled 2024-08-16: qty 15, 84d supply, fill #0

## 2024-08-16 MED ORDER — LIDOCAINE 5 % EX PTCH
1.0000 | MEDICATED_PATCH | CUTANEOUS | 0 refills | Status: AC
  Filled 2024-08-16: qty 30, 30d supply, fill #0

## 2024-08-16 MED ORDER — HYDROXYZINE HCL 25 MG PO TABS
25.0000 mg | ORAL_TABLET | Freq: Three times a day (TID) | ORAL | 0 refills | Status: AC | PRN
  Filled 2024-08-16: qty 15, 5d supply, fill #0

## 2024-08-16 NOTE — TOC Transition Note (Signed)
 Transition of Care [facility]) - Discharge Note   Patient Details  Name: Leroy Simmons MRN: [id number] Date of Birth: 1971/02/16  Transition of Care [facility]) CM/SW Contact:  Waddell Barnie Rama, RN Phone Number: 08/16/2024, 10:05 AM   Clinical Narrative:    For dc today, he has a follow up apt at the [facility] clinic on [address].      Barriers to Discharge: Continued Medical Work up   Patient Goals and CMS Choice            Discharge Placement                       Discharge Plan and Services Additional resources added to the After Visit Summary for                                       Social Drivers of Health (SDOH) Interventions SDOH Screenings   Food Insecurity: Food Insecurity Present (08/06/2024)  Housing: High Risk (08/06/2024)  Transportation Needs: No Transportation Needs (08/06/2024)  Utilities: At Risk (08/06/2024)  Alcohol Screen: Low Risk  (01/20/2023)  Depression (PHQ2-9): High Risk (06/01/2023)  Financial Resource Strain: Low Risk  (01/20/2023)  Social Connections: Socially Isolated (08/06/2024)  Tobacco Use: Low Risk  (08/06/2024)  Recent Concern: Tobacco Use - Medium Risk (06/10/2024)   Received from [facility]     Readmission Risk Interventions    01/19/2024    2:29 PM 03/22/2023   11:38 AM 01/23/2023    3:20 PM  Readmission Risk Prevention Plan  Transportation Screening Complete Complete Complete  PCP or Specialist Appt within 3-5 Days   Complete  HRI or Home Care Consult   --  Social Work Consult for Recovery Care Planning/Counseling   --  Palliative Care Screening   Not Applicable  Medication Review Oceanographer) Complete Complete Complete  PCP or Specialist appointment within 3-5 days of discharge  Complete   HRI or Home Care Consult Complete Complete   SW Recovery Care/Counseling Consult Complete Complete   Palliative Care Screening Not Applicable Not Applicable   Skilled Nursing Facility Not Applicable Not Applicable

## 2024-08-16 NOTE — Progress Notes (Signed)
 [facility] NEPHROLOGY PROGRESS NOTE  Assessment/ Plan: Pt is a 53 y.o. yo male with past medical history significant for hypertension, type 2 diabetes, PE, CKD 4, CHF who was admitted for acute on chronic CHF and AKI.    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  Acute change in GFR likely hemodynamically mediated caused by Entresto, diuretics.  He was treated with IV diuretics with significant improvement of heart failure.   - RHC on 11/11 with euvolemia.  The creatinine level seems to have plateaued and euvolemic on exam.  Resume furosemide  40 mg daily.  No sign of uremia. There is no urgent need for dialysis at this time however patient understands that he will likely need dialysis soon.  He is going home today.  We will arrange outpatient follow-up at [facility].  Discussed with the primary team and with the patient.  # HFpEF LVEF 60%: Treated with diuretics which was held for few days.  Resume furosemide  40 mg on discharge.  Recommend to continue holding Entresto.  He was seen by cardiologist as well.    # CKD stage IV: Thought to be due to diabetic nephropathy and was seen by [facility] in the past and then lost to follow-up.  Reportedly the baseline creatinine level is around 3.  Patient said he wants to follow-up with a nephrologist locally.  #Hypertension: Monitor BP with diuresis.  # CKD-MBD/secondary hyperparathyroidism/hyperphosphatemia: PTH level 112 acceptable for CKD.  Started sevelamer for hyperphosphatemia.  Monitor calcium , phosphorus.  #Normocytic Anemia -TSAT 12% F116 -> already given Venofer 500mg . Hb improved.  # Hypokalemia: Replete potassium chloride .  Follow lab.  Please call us  back with question.  Subjective: Seen and examined at the bedside.  Urine output is around a liter.  He denies nausea, vomiting, dysgeusia, chest pain or shortness of breath.  No new event.  Likely discharge today. Objective Vital signs in last 24  hours: Vitals:   08/15/24 2132 08/16/24 0013 08/16/24 0503 08/16/24 0742  BP: 131/87 108/71 119/70 114/62  Pulse:  73 77 70  Resp: 19 19 17 20   Temp: 97.6 F (36.4 C) (!) 97.5 F (36.4 C) (!) 97.3 F (36.3 C) 98 F (36.7 C)  TempSrc: Oral Oral Oral Oral  SpO2:  96% 98% 99%  Weight:   105.9 kg   Height:       Weight change: 1.164 kg  Intake/Output Summary (Last 24 hours) at 08/16/2024 1034 Last data filed at 08/16/2024 0745 Gross per 24 hour  Intake 600 ml  Output 675 ml  Net -75 ml       Labs: RENAL PANEL Recent Labs  Lab 08/11/24 0221 08/12/24 0232 08/13/24 0239 08/13/24 1112 08/14/24 0238 08/15/24 0259 08/16/24 0219  NA 138 137 138 140  139 139 137 136  K 3.6 3.8 3.5 3.2*  3.2* 3.6 3.4* 3.7  CL 100 100 101  --  101 102 103  CO2 23 22 21*  --  21* 20* 20*  GLUCOSE 175* 242* 153*  --  195* 219* 191*  BUN 72* 80* 88*  --  93* 104* 106*  CREATININE 4.73* 5.09* 5.44*  --  5.76* 5.55* 5.61*  CALCIUM  8.4* 8.4* 8.3*  --  8.4* 8.4* 8.3*  MG 2.2 2.2  --   --  2.3 2.4 2.2  PHOS 5.1* 5.4* 5.9*  --  6.5* 5.9* 5.9*  ALBUMIN  2.8* 2.8* 2.9*  --  3.0* 3.1* 2.7*    Liver Function Tests:  Recent Labs  Lab 08/14/24 0238 08/15/24 0259 08/16/24 0219  ALBUMIN  3.0* 3.1* 2.7*   No results for input(s): LIPASE, AMYLASE in the last 168 hours. No results for input(s): AMMONIA in the last 168 hours. CBC: Recent Labs    08/07/24 0322 08/08/24 0559 08/09/24 0308 08/11/24 0221 08/13/24 1112 08/15/24 0259  HGB 10.2* 10.6* 11.3* 12.0* 10.9*  11.2* 11.9*  MCV 91.8 92.0 90.9 90.3  --  89.7  VITAMINB12 370  --   --   --   --   --   FERRITIN 116  --   --   --   --   --   TIBC 242*  --   --   --   --   --   IRON 28*  --   --   --   --   --     Cardiac Enzymes: No results for input(s): CKTOTAL, CKMB, CKMBINDEX, TROPONINI in the last 168 hours. CBG: Recent Labs  Lab 08/15/24 0613 08/15/24 1212 08/15/24 1617 08/15/24 2137 08/16/24 0627  GLUCAP 217*  159* 189* 153* 161*    Iron Studies: No results for input(s): IRON, TIBC, TRANSFERRIN, FERRITIN in the last 72 hours. Studies/Results: No results found.   Medications: Infusions:    Scheduled Medications:  apixaban   5 mg Oral BID   atorvastatin   80 mg Oral QHS   carvedilol   25 mg Oral BID WC   dorzolamide -timolol   1 drop Both Eyes BID   guaiFENesin   600 mg Oral BID   hydrALAZINE   10 mg Oral Q8H   insulin  aspart  0-20 Units Subcutaneous TID WC   insulin  aspart  0-5 Units Subcutaneous QHS   insulin  aspart  6 Units Subcutaneous TID WC   insulin  glargine-yfgn  32 Units Subcutaneous QHS   latanoprost   1 drop Both Eyes QHS   lidocaine   1 patch Transdermal Q24H   pantoprazole   40 mg Oral Daily   senna  1 tablet Oral BID   sevelamer carbonate  800 mg Oral TID WC    have reviewed scheduled and prn medications.  Physical Exam: General:NAD, looks comfortable and able to lie flat Heart:RRR, s1s2 nl Lungs:clear b/l, no crackle Abdomen:soft, Non-tender, distended Extremities: Bilateral lower extremities dependent edema, stable.  dressing on.  Neurology: Alert awake and following commands  Leroy Simmons Leroy Simmons 08/16/2024,10:34 AM  LOS: 9 days

## 2024-08-16 NOTE — Plan of Care (Signed)
   Problem: Coping: Goal: Ability to adjust to condition or change in health will improve Outcome: Progressing

## 2024-08-16 NOTE — Discharge Summary (Signed)
 Name: Leroy Simmons MRN: [id number] DOB: 1971-09-03 53 y.o. PCP: Leroy Rosaline SQUIBB, NP  Date of Admission: 08/06/2024  1:22 AM Date of Discharge: 08/16/2024 Attending Physician: Dr. Reyes Simmons  Discharge Diagnosis: 1. Principal Problem:   Acute on chronic diastolic heart failure with preserved ejection fraction (HCC) Active Problems:   Uncontrolled type 2 diabetes mellitus with hyperglycemia, with long-term current use of insulin  (HCC)   Anemia of chronic disease   History of DVT (deep vein thrombosis), LLE 2024   Chronic kidney disease (CKD), stage IV (severe) (HCC)   History of pulmonary embolism, LLL 01/2024   Peripheral edema   CKD (chronic kidney disease), stage V (HCC)   Discharge Medications: Allergies as of 08/16/2024   No Known Allergies      Medication List     STOP taking these medications    torsemide  100 MG tablet Commonly known as: DEMADEX        TAKE these medications    apixaban  5 MG Tabs tablet Commonly known as: ELIQUIS  Take 1 tablet (5 mg total) by mouth 2 (two) times daily.   atorvastatin  80 MG tablet Commonly known as: LIPITOR  Take 1 tablet (80 mg total) by mouth daily. Stop the rosuvastatin .   blood glucose meter kit and supplies Dispense based on patient and insurance preference. Use up to four times daily as directed. (FOR ICD-10 E10.9, E11.9).   carvedilol  25 MG tablet Commonly known as: COREG  Take 1 tablet (25 mg total) by mouth 2 (two) times daily with a meal. What changed:  medication strength how much to take   dorzolamide -timolol  2-0.5 % ophthalmic solution Commonly known as: COSOPT  Place 1 drop into both eyes 2 (two) times daily.   Embecta Pen Needle Nano 2 Gen 32G X 4 MM Misc Generic drug: Insulin  Pen Needle Use with insulin    FLUoxetine  20 MG capsule Commonly known as: PROZAC  Take 1 capsule (20 mg total) by mouth daily.   FreeStyle Libre 3 Plus Sensor Misc Change sensor every 15 days. Use to check blood  sugar continuously.   FreeStyle Libre 3 Reader [place] Use to check blood sugar continuously.   furosemide  40 MG tablet Commonly known as: Lasix  Take 1 tablet (40 mg total) by mouth daily.   hydrALAZINE  10 MG tablet Commonly known as: APRESOLINE  Take 1 tablet (10 mg total) by mouth 3 (three) times daily.   hydrOXYzine  25 MG tablet Commonly known as: ATARAX  Take 1 tablet (25 mg total) by mouth 3 (three) times daily as needed for up to 5 days for itching.   insulin  lispro 100 UNIT/ML KwikPen Commonly known as: HUMALOG  Inject 6 Units into the skin 3 (three) times daily before meals. What changed: how much to take   latanoprost  0.005 % ophthalmic solution Commonly known as: XALATAN  Administer 1 drop into the left eye nightly.   lidocaine  5 % Commonly known as: LIDODERM  Place 1 patch onto the skin daily. Remove & Discard patch within 12 hours or as directed by MD Start taking on: August 17, 2024   Ozempic (0.25 or 0.5 MG/DOSE) 2 MG/3ML Sopn Generic drug: Semaglutide(0.25 or 0.5MG /DOS) Inject 0.25 mg into the skin once a week. For 4 weeks. Then, increase to 0.5 mg weekly thereafter.   pantoprazole  40 MG tablet Commonly known as: PROTONIX  Take 1 tablet (40 mg total) by mouth daily.   sevelamer carbonate 800 MG tablet Commonly known as: RENVELA Take 1 tablet (800 mg total) by mouth 3 (three) times daily with meals.  Toujeo  Max SoloStar 300 UNIT/ML Solostar Pen Generic drug: insulin  glargine (2 Unit Dial ) Inject 32 Units into the skin daily. What changed: how much to take   Ventolin  HFA 108 (90 Base) MCG/ACT inhaler Generic drug: albuterol  Inhale 2 puffs into the lungs every 6 (six) hours as needed for wheezing or shortness of breath.        Disposition and follow-up:   Leroy Simmons was discharged from [facility] in Stable condition.  At the hospital follow up visit please address:  1.  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 Cr plateaued around 5.61, BUN 106, GFR 12 on discharge. Discharged on Furosemide  40 mg daily and Sevelamer 800 mg TID. Consider supplementing vitamin D.   HFpEF: Assess volume and respiratory status. RHC on 11/10 with RA of 4. Discharged on Furosemide  40 mg daily, Carvedilol  25 mg BID, and Hydralazine  10 mg TID. GDMT limited due to kidney disease.   T2DM: Sugars stable while in hospital. Discharged on 32 units glargine and 6 units lispro. Per chart, recently started on GLP1 agonist therapy. Did not get an injection while inpatient but this med was resumed on hospital discharge. Ensure he was able to start GLP1.   Normocytic Anemia: Given 500 mg IV Iron. Hgb stable.   Homelessness: Was living in his car prior to admission. Given housing resources during admission.  2.  Labs / imaging needed at time of follow-up: CBC and BMP   3.  Pending labs/ test needing follow-up: none  Follow-up Appointments:  Follow-up Information     Leroy Rosaline SQUIBB, NP. Call today.   Specialty: Internal Medicine Why: to make a hospital follow up appointment Contact information: [address] [place] [address] [phone]         Leroy Simmons, Leroy Simmons. Go on 09/12/2024.   Specialties: Cardiology, Radiology Why: 10:05 AM Contact information: [address] [place] [address] [phone]         Pa, Leroy Simmons. Call today.   Why: to make a hospital follow up appointment Contact information: [address] [place] [address] [phone]                  Hospital Course by problem list:  Leroy Simmons, 53 yo male, PMHx of HFpEF, PE in 2025, DVT in 2024, CKD4, DMT2 presented on 11/4 with one week of worsening SOB, CP and URI symptoms and admitted for acute on chronic heart failure exacerbation and progression of his kidney disease.   Acute on chronic  diastolic HF On admission, no ischemic signs on EKG. Troponin peaked at 56. Congestion on CXR. BNP 241. Last TTE 01/2024 with LVEF 60 - 65% without wall motion abnormalities with LA dilation. Patient endorsed intermittent use of the his Torsemide . Covid and flu negative. Suspect exacerbated by medication non adherence and worsening kidney disease. This admission, TTE LVEF 60-65% and LV w/o RMWA. Ferritin 116, Sat ratios 12, given 500 mg IV Iron on 11/4. Patient was aggressively diuresed with total UOP since admission of 11,975 mL and weight down by ~ 13 lbs. Right heart cath on 11/11 showed RA pressure 4 indicative of euvolemia and confirming that his kidney disease is the main culprit of his presentation. GDMT limited due to Cr, continued on hydralazine  10 mg TID and carvedilol  25 mg BID inpatient, will discharge on same regimen. Nephrology recommends discharging on Furosemide  40 mg daily. Has follow up with Dr.  Acharya's team outpatient.   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 Historically followed with [facility]. On admission, Cr 3.47 BUN 46, GFR 21. In 01/2024, urine protein creatinine was 9.96, repeat on admission result outside reportable range. Vit D 25 15.30, PTH 112. Cr and BUN up trended while GFR down trended throughout admission likely due to IV Lasix  and one day of entresto. Care team discussed with patient the likelihood of him needing dialysis in the future. While admitted, he had no signs of uremia and no acute need for HD. Cr plateaued around 5.6 on day of discharge, BUN 106, GFR 12. Started on Sevelamer 800 mg TID while here. Will discharge on Sevelamer 800 mg TID and furosemide  40 mg daily. Will have outpatient follow up with [facility].    Acute Hypoxic respiratory failure, resolved No overt consolidation on CXR to suspect PNA. RVP negative on admission. V/Q scan negative for PE. Cough and dyspnea resolved with  diuresis. Breathing well on room air and walked with mobility while in hospital everyday.   Hypercoagulable, VTE Hx Patient with hx of DVT in 2024 and PE in 01/2024. Finished course of 6 months of Eliquis  last month. However, because the PE 01/2024 was the second episode of thrombotic event, patient was to be on lifetime anticoagulation.  PERC 2+ on admission. Moderate risk factor for PE. V/Q scan on admission negative for PE.  BL Vas US  on admission c/w subcutaneous edema and w/o evidence of DVT. Continued on Eliquis  5 mg BID in hospital and on discharge.    Hypertension Increased his home carvedilol  from 12.5 mg to 25 mg BID and BP remained stable throughout admission on carvedilol  and hydralazine  10 mg TID. Will discharge on this regimen.    Diabetes mellitus on chronic insulin  Prior to admission patient on 52 units Glargine and 8 units Lispro TID. A1c here in hospital 7.2. Throughout admission, sugars controlled on 28-32 units glargine at bedtime and 6 units aspart with meals. Fasting blood glucose 161 on day of discharge. Will discharge home on Glargine 32 units daily and Lispro 6 units TID and GLP1 agonist that was recently started by PCP as well as close follow up with PCP.  Normocytic anemia Hgb 11.5 on admission, likely due to kidney disease. Fe 28, Saturation ratios 12, TIBC 242 this admission. Gave IV Iron 11/5. Hgb 11.9 day before discharge 11/13 and stable.   Non obstructive CAD Hyperlipidemia Left heart cath 03/2022 that revealed 50% D2, 40% mid LAD, and 25% ramus lesions. Presenting chest pain resolved with diuresis. Continue home atorvastatin  80 mg daily and carvedilol  25 mg BID on discharge.  OSA suspected Follows with pulmonology outpatient and is being worked up for excessive daytime sleepiness with home sleep test. He did not wear CPAP while in hospital.    GERD Continue pantoprazole  40 mg daily.    Elevated Alkaline phosphatase Alk phos 133 on admission and, per chart,  stable from prior two hepatic function panels. No hepatic enzyme elevation.    SDOH TOC consulted for housing information, met with him and provided resources.    Subjective Slept better last night because he's less itchy. Denies abdominal pain or nausea. Denies chest pain or SOB. Unna boots are back on, they feel tight but not itchy. His appetite, vision and bowels are normal. Urine is not frothy or bubbly. Discussed plan for discharge today and follow up with care team outpatient.   Discharge Exam:   BP 114/62 (BP Location: Left  Arm)   Pulse 70   Temp 98 F (36.7 C) (Oral)   Resp 20   Ht 5' 8 (1.727 m)   Wt 105.9 kg   SpO2 99%   BMI 35.50 kg/m  Physical Exam Vitals reviewed.  Constitutional:      Comments: Normal appearing male lying in bed in no distress  HENT:     Mouth/Throat:     Mouth: Mucous membranes are moist.  Eyes:     Extraocular Movements: Extraocular movements intact.  Cardiovascular:     Rate and Rhythm: Normal rate and regular rhythm.     Pulses: Normal pulses.     Heart sounds: Normal heart sounds.  Pulmonary:     Effort: Pulmonary effort is normal.  Abdominal:     General: Bowel sounds are normal.     Palpations: Abdomen is soft.  Musculoskeletal:     Right lower leg: No tenderness. Edema present.     Left lower leg: No tenderness. Edema present.     Comments: BL unna boots on, swelling the same  Skin:    General: Skin is warm.     Comments: Eczematous rash in rectangular distribution where tape was previously    Neurological:     General: No focal deficit present.     Mental Status: He is oriented to person, place, and time.  Psychiatric:        Mood and Affect: Mood normal.        Behavior: Behavior normal.     Pertinent Labs, Studies, and Procedures:     Latest Ref Rng & Units 08/15/2024    2:59 AM 08/13/2024   11:12 AM 08/11/2024    2:21 AM  CBC  WBC 4.0 - 10.5 K/uL 8.5   8.2   Hemoglobin 13.0 - 17.0 g/dL 88.0  88.7    89.0  87.9    Hematocrit 39.0 - 52.0 % 34.9  33.0    32.0  35.3   Platelets 150 - 400 K/uL 313   372        Latest Ref Rng & Units 08/16/2024    2:19 AM 08/15/2024    2:59 AM 08/14/2024    2:38 AM  CMP  Glucose 70 - 99 mg/dL 808  780  804   BUN 6 - 20 mg/dL 893  895  93   Creatinine 0.61 - 1.24 mg/dL 4.38  4.44  4.23   Sodium 135 - 145 mmol/L 136  137  139   Potassium 3.5 - 5.1 mmol/L 3.7  3.4  3.6   Chloride 98 - 111 mmol/L 103  102  101   CO2 22 - 32 mmol/L 20  20  21    Calcium  8.9 - 10.3 mg/dL 8.3  8.4  8.4     VAS US  LOWER EXTREMITY VENOUS (DVT) Result Date: 08/07/2024  Lower Venous DVT Study Patient Name:  Leroy Simmons  Date of Exam:   08/07/2024 Medical Rec #: [id number]       Accession #:    [id number] Date of Birth: 1970-12-14       Patient Gender: M Patient Age:   27 years Exam Location:  [facility] Procedure:      VAS US  LOWER EXTREMITY VENOUS (DVT) Referring Phys: JEFFREY HATCHER --------------------------------------------------------------------------------  Indications: SOB, and Edema (L>R).  Risk Factors: PE 2025 DVT LLE peroneal 2024. Anticoagulation: Eliquis  (non compliant). Comparison Study: Previous exam on 01/09/2024 showed partial thrombus in left  peroneal vein. Performing Technologist: Ezzie Potters RVT, RDMS  Examination Guidelines: A complete evaluation includes B-mode imaging, spectral Doppler, color Doppler, and power Doppler as needed of all accessible portions of each vessel. Bilateral testing is considered an integral part of a complete examination. Limited examinations for reoccurring indications may be performed as noted. The reflux portion of the exam is performed with the patient in reverse Trendelenburg.  +---------+---------------+---------+-----------+----------+--------------+ RIGHT    CompressibilityPhasicitySpontaneityPropertiesThrombus Aging +---------+---------------+---------+-----------+----------+--------------+ CFV      Full            Yes      Yes                                 +---------+---------------+---------+-----------+----------+--------------+ SFJ      Full                                                        +---------+---------------+---------+-----------+----------+--------------+ FV Prox  Full           Yes      Yes                                 +---------+---------------+---------+-----------+----------+--------------+ FV Mid   Full           Yes      Yes                                 +---------+---------------+---------+-----------+----------+--------------+ FV DistalFull           Yes      Yes                                 +---------+---------------+---------+-----------+----------+--------------+ PFV      Full                                                        +---------+---------------+---------+-----------+----------+--------------+ POP      Full           Yes      Yes                                 +---------+---------------+---------+-----------+----------+--------------+ PTV      Full                                                        +---------+---------------+---------+-----------+----------+--------------+ PERO     Full                                                        +---------+---------------+---------+-----------+----------+--------------+   +---------+---------------+---------+-----------+----------+--------------+  LEFT     CompressibilityPhasicitySpontaneityPropertiesThrombus Aging +---------+---------------+---------+-----------+----------+--------------+ CFV      Full           Yes      Yes                                 +---------+---------------+---------+-----------+----------+--------------+ SFJ      Full                                                        +---------+---------------+---------+-----------+----------+--------------+ FV Prox  Full           Yes      Yes                                  +---------+---------------+---------+-----------+----------+--------------+ FV Mid   Full           Yes      Yes                                 +---------+---------------+---------+-----------+----------+--------------+ FV DistalFull           Yes      Yes                                 +---------+---------------+---------+-----------+----------+--------------+ PFV      Full                                                        +---------+---------------+---------+-----------+----------+--------------+ POP      Full           Yes      Yes                                 +---------+---------------+---------+-----------+----------+--------------+ PTV      Full                                                        +---------+---------------+---------+-----------+----------+--------------+ PERO     Full                                                        +---------+---------------+---------+-----------+----------+--------------+     Summary: BILATERAL: - No evidence of deep vein thrombosis seen in the lower extremities, bilaterally. -No evidence of popliteal cyst, bilaterally. -Subcutaneous edema, bilaterally.  *See table(s) above for measurements and observations. Electronically signed by Lonni Gaskins MD on 08/07/2024 at 4:02:18 PM.    Final    NM Pulmonary Perfusion Result Date: 08/06/2024 EXAM: NM Lung Perfusion Scan.  CLINICAL HISTORY: Pulmonary embolism (PE) suspected, high prob. TECHNIQUE: Radiolabeled MAA was administered intravenously and planar images of the lungs were obtained in multiple projections. RADIOPHARMACEUTICAL: 4.4 millicurie technetium-78m albumin  aggregated (MAA) injection solution. COMPARISON: X-ray at 11:45. FINDINGS: PERFUSION: No wedge-shaped peripheral perfusion defect within left or right lung to suggest acute pulmonary embolism. Normal perfusion pattern. IMPRESSION: 1. No perfusion defects to indicate pulmonary embolism.  Electronically signed by: Norleen Boxer MD 08/06/2024 02:35 PM EST RP Workstation: [id number]   ECHOCARDIOGRAM COMPLETE Result Date: 08/06/2024    ECHOCARDIOGRAM REPORT   Patient Name:   Leroy Simmons Date of Exam: 08/06/2024 Medical Rec #:  [id number]      Height:       68.0 in Accession #:    [id number]     Weight:       245.0 lb Date of Birth:  1970-12-03      BSA:          2.228 m Patient Age:    52 years       BP:           158/102 mmHg Patient Gender: M              HR:           83 bpm. Exam Location:  Inpatient Procedure: 2D Echo (Both Spectral and Color Flow Doppler were utilized during            procedure). STAT ECHO Indications:    Dyspnea R06.00  History:        Patient has prior history of Echocardiogram examinations, most                 recent 01/10/2024. Cardiomegaly, CAD, Pulmonary HTN,                 Signs/Symptoms:Chest Pain and Dyspnea; Risk                 Factors:Hypertension, Diabetes and Hyperlipidemia.  Sonographer:    BERNARDA ROCKS Referring Phys: [id number] HAYLEY N NAASZ IMPRESSIONS  1. Left ventricular ejection fraction, by estimation, is 60 to 65%. The left ventricle has normal function. The left ventricle has no regional wall motion abnormalities. Indeterminate diastolic filling due to E-A fusion.  2. Right ventricular systolic function is normal. The right ventricular size is normal. Tricuspid regurgitation signal is inadequate for assessing PA pressure.  3. Left atrial size was moderately dilated.  4. The mitral valve is normal in structure. No evidence of mitral valve regurgitation. No evidence of mitral stenosis.  5. The aortic valve was not well visualized. Aortic valve regurgitation is not visualized. No aortic stenosis is present.  6. The inferior vena cava is normal in size with greater than 50% respiratory variability, suggesting right atrial pressure of 3 mmHg. Comparison(s): No significant change from prior study. Prior images reviewed side by side. FINDINGS  Left Ventricle:  Left ventricular ejection fraction, by estimation, is 60 to 65%. The left ventricle has normal function. The left ventricle has no regional wall motion abnormalities. The left ventricular internal cavity size was normal in size. There is  no left ventricular hypertrophy. Indeterminate diastolic filling due to E-A fusion. Right Ventricle: The right ventricular size is normal. No increase in right ventricular wall thickness. Right ventricular systolic function is normal. Tricuspid regurgitation signal is inadequate for assessing PA pressure. Left Atrium: Left atrial size was moderately dilated. Right Atrium: Right atrial size was normal in size. Pericardium: There is no evidence of pericardial  effusion. Mitral Valve: The mitral valve is normal in structure. No evidence of mitral valve regurgitation. No evidence of mitral valve stenosis. MV peak gradient, 9.5 mmHg. The mean mitral valve gradient is 3.0 mmHg. Tricuspid Valve: The tricuspid valve is normal in structure. Tricuspid valve regurgitation is not demonstrated. No evidence of tricuspid stenosis. Aortic Valve: The aortic valve was not well visualized. Aortic valve regurgitation is not visualized. No aortic stenosis is present. Aortic valve mean gradient measures 3.0 mmHg. Aortic valve peak gradient measures 6.8 mmHg. Aortic valve area, by VTI measures 4.35 cm. Pulmonic Valve: The pulmonic valve was normal in structure. Pulmonic valve regurgitation is not visualized. No evidence of pulmonic stenosis. Aorta: The aortic root and ascending aorta are structurally normal, with no evidence of dilitation. Venous: The inferior vena cava is normal in size with greater than 50% respiratory variability, suggesting right atrial pressure of 3 mmHg. IAS/Shunts: No atrial level shunt detected by color flow Doppler.  LEFT VENTRICLE PLAX 2D LVIDd:         5.50 cm      Diastology LVIDs:         3.30 cm      LV e' medial:    6.74 cm/s LV PW:         0.90 cm      LV E/e' medial:   22.0 LV IVS:        1.00 cm      LV e' lateral:   9.03 cm/s LVOT diam:     2.30 cm      LV E/e' lateral: 16.4 LV SV:         92 LV SV Index:   41 LVOT Area:     4.15 cm  LV Volumes (MOD) LV vol d, MOD A2C: 173.0 ml LV vol d, MOD A4C: 183.0 ml LV vol s, MOD A2C: 57.9 ml LV vol s, MOD A4C: 80.0 ml LV SV MOD A2C:     115.1 ml LV SV MOD A4C:     183.0 ml LV SV MOD BP:      115.6 ml RIGHT VENTRICLE RV Basal diam:  3.60 cm RV S prime:     12.60 cm/s TAPSE (M-mode): 2.1 cm LEFT ATRIUM              Index        RIGHT ATRIUM           Index LA diam:        5.00 cm  2.24 cm/m   RA Area:     13.70 cm LA Vol (A2C):   82.4 ml  36.99 ml/m  RA Volume:   27.50 ml  12.34 ml/m LA Vol (A4C):   109.0 ml 48.93 ml/m LA Biplane Vol: 97.2 ml  43.63 ml/m  AORTIC VALVE                    PULMONIC VALVE AV Area (Vmax):    4.22 cm     PV Vmax:          1.01 m/s AV Area (Vmean):   3.57 cm     PV Peak grad:     4.0 mmHg AV Area (VTI):     4.35 cm     PR End Diast Vel: 2.93 msec AV Vmax:           130.00 cm/s AV Vmean:          82.700 cm/s AV VTI:  0.211 m AV Peak Grad:      6.8 mmHg AV Mean Grad:      3.0 mmHg LVOT Vmax:         132.00 cm/s LVOT Vmean:        71.000 cm/s LVOT VTI:          0.221 m LVOT/AV VTI ratio: 1.05  AORTA Ao Root diam: 3.40 cm Ao Asc diam:  3.60 cm MITRAL VALVE MV Area (PHT): 7.74 cm     SHUNTS MV Area VTI:   3.35 cm     Systemic VTI:  0.22 m MV Peak grad:  9.5 mmHg     Systemic Diam: 2.30 cm MV Mean grad:  3.0 mmHg MV Vmax:       1.54 m/s MV Vmean:      77.6 cm/s MV Decel Time: 98 msec MV E velocity: 148.00 cm/s MV A velocity: 109.00 cm/s MV E/A ratio:  1.36 Stanly Leavens MD Electronically signed by Stanly Leavens MD Signature Date/Time: 08/06/2024/12:04:35 PM    Final    DG Chest 2 View Result Date: 08/06/2024 CLINICAL DATA:  Shortness of breath. EXAM: CHEST - 2 VIEW COMPARISON:  January 09, 2024 FINDINGS: The cardiac silhouette is mildly enlarged and unchanged in size. There is  prominence of the pulmonary vasculature with mild, diffusely increased interstitial lung markings noted. No focal consolidation, pleural effusion or pneumothorax is identified. The visualized skeletal structures are unremarkable. IMPRESSION: Mild cardiomegaly with mild pulmonary vascular congestion. Electronically Signed   By: Suzen Dials M.D.   On: 08/06/2024 02:56     Discharge Instructions: Discharge Instructions     (HEART FAILURE PATIENTS) Call MD:  Anytime you have any of the following symptoms: 1) 3 pound weight gain in 24 hours or 5 pounds in 1 week 2) shortness of breath, with or without a dry hacking cough 3) swelling in the hands, feet or stomach 4) if you have to sleep on extra pillows at night in order to breathe.   Complete by: As directed    Call MD for:  difficulty breathing, headache or visual disturbances   Complete by: As directed    Call MD for:  extreme fatigue   Complete by: As directed    Call MD for:  persistant dizziness or light-headedness   Complete by: As directed    Call MD for:  persistant nausea and vomiting   Complete by: As directed    Diet - low sodium heart healthy   Complete by: As directed    Diet renal/carb modified with fluid restriction   Complete by: As directed    Discharge instructions   Complete by: As directed    Thank you for allowing us  to be part of your care. You were hospitalized for an exacerbation of your heart and kidney disease. We treated you with medicines that helped you rid of excess fluid and blood pressure medicine.    See the changes in your medications and management of your chronic conditions below:   *For your chronic kidney disease -We have STARTED you on these medications:             - Furosemide  40 mg daily              - Sevelamer 800 mg three times daily with meals               -We have STOPPED the following medications:             -  Torsemide  100 mg twice daily, do not take this anymore    *For your heart  and blood pressure -We have STARTED you on these medications:             - Carvedilol  25 mg twice daily. You were previously taking 12.5 mg, we increased this to 25 mg twice daily.   -Continue taking your Hydralazine  10 mg three times daily    *For your diabetes -We are changing your insulin  regimen:             - Inject 32 units of Glargine daily              - Inject 6 units of Lispro three times a day    *For your itching  -We sent you home with five days worth of Hydroxyzine  25 mg. Please take one tablet three times daily as needed for itching.    FOLLOW UP APPOINTMENTS: Please see your primary care doctor within 10 days of hospital discharge.    Please go to your cardiology appointment on 09/12/2024.    You should be contacted about scheduling an appointment with the kidney doctors.    Please call your PCP or our clinic if you have any questions or concerns, we may be able to help and keep you from a long and expensive emergency room wait. Our clinic and after hours phone number is [phone]. The best time to call is Monday through Friday 9 am to 4 pm but there is always someone available 24/7 if you have an emergency. If you need medication refills please notify your pharmacy one week in advance and they will send us  a request.    We are glad you are feeling better,   Viktoria King Internal Medicine Inpatient Teaching Service at [facility] assistance programs Crisis assistance programs   -[facility] Coordinated Entry Program. If you are experiencing homelessness in [place], [place] , your first point of contact should be Pensions Consultant. You can reach Coordinated Entry by calling (336) [phone] or by emailing [email or web address] [email or web address].  Community access points: [employer] [phone], [place]) every Tuesday from 9am-10am. [facility] ([address] [place]) every Wednesday from  8am-9am.    -[place] Coordinated Re-entry Daniel [place]: Dial  211 and request. Offers referrals to homeless shelters in the area.     -The [employer] (772)398-9499) offers several services to local families, as funding allows. The Emergency Assistance Program (EAP), which they administer, provides household goods, free food, clothing, and financial aid to people in need in the [place]  area. The EAP program does have some qualification, and counselors will interview clients for financial assistance by written referral only. Referrals need to be made by the Department of Social Services or by other EAP approved human services agencies or charities in the area.   -[facility] of [employer], which can be reached at ([phone], offers emergency assistance programs for those in need of help, such as food, rent assistance, a soup kitchen, shelter, and clothing. They are based in [facility]   but provide a number of services to those that qualify for assistance.    [facility] Department of Social Services may be able to offer temporary financial assistance and cash grants for paying rent and utilities, Help may be provided for local county residents who may be experiencing personal crisis when other resources, including government programs, are  not available. Call ([phone]   -[facility] [employer] is a [employer] agency, The organization can offer emergency assistance for paying rent, caremark rx, utilities, food, household products and furniture. They offer extensive emergency and transitional housing for families, children and single women, and also run a Boy's and [employer]. Thrift Shops, Secondary School Teacher, and other aid offered too. [address] [place], [place]  [address], [phone]   -[place] Low Income Energy Assistance Program - This is offered for [facility] families. The federal government  created [employer] Program provides a one-time cash grant payment to help eligible low-income families pay their electric and heating bills. [address] [place], [place]  [address], [phone]   -[place] Emergency Assistance - A program offers emergency utility and rent funds for greater [employer] area residents. The program can also provide counseling and referrals to charities and government programs. Also provides food and a free meal program that serves lunch Mondays - Saturdays and dinner seven days per week to individuals in the community. [address] [place], [place]  [address], [phone]   -[employer] - Offers affordable apartment and housing communities across      [place] and [place]. The low income and seniors can access public housing, rental assistance to qualified applicants, and apply for the section 8 rent subsidy program. Other programs include Chiropractor and Engineer, Maintenance. [address], [place], [place]  [address], dial  [phone].   -The [facility] provides transitional housing to veterans and the disabled. Clients will also access other services too, including assistance in applying for Disability, life skills classes, case management, and assistance in finding permanent housing. [address], [place], [place]  [address], call [phone]   -[place] Transitional Housing through [employer] is for people who were just evicted or that are formerly homeless. The non-profit will also help then gain self-sufficiency, find a home or apartment to live in, and also provides information on rent assistance when needed. Phone [phone]   -The [facility] helps low income, elderly, or disabled residents in seven counties in the [place]  ([place], [place], [place], [place], [place], [place], [place], and [place]) save energy and reduce their utility bills by improving energy efficiency. Phone [phone].   -[employer] is located in the [place] [facility] in the [employer], [address], [place], [place] [address]. Parking is in the rear of the building. Phone: [phone]   General Email: info@gsohc .org   GHC provides free housing counseling assistance in locating affordable rental housing or housing with support services for families and individuals in crisis and the chronically homeless. We provide potential resources for other housing needs like utilities. Our trained counselors also work with clients on budgeting and financial literacy in effort to empower them to take control of their financial situations. [employer] collaborates with homeless service providers and other stakeholders as part of the [employer] COC (Continuum of Care). The (COC) is a regional/local planning body that coordinates housing and services funding for homeless families and individuals. The role of [facility] in the COC is through housing counseling to work with people we serve on diversion strategies for those that are at imminent risk of becoming homeless. We also work with the Coordinated Assessment/Entry Specialist who attempts to find temporary solutions and/or connects the people to Housing First, Rapid Re-housing  or transitional housing programs. Our Homelessness Prevention Housing Counselors meet with clients on business days (Monday-Fridays, except scheduled holidays) from 8:30 am to 4:30 pm.   Legal assistance for evictions, foreclosure, and [place]  -If you need free legal advice on civil issues, such as foreclosures, evictions, electronics engineer, government programs, domestic issues and [place], Armed Forces Operational Officer Aid of [place]  [facility]) is a associate professor firm that provides free legal  services and counsel to lower income people, seniors, disabled, and others, The goal is to ensure everyone has access to justice and fair representation. Call them at [phone].   [facility] for Housing and Community Studies can provide info about obtaining legal assistance with evictions. Phone [phone].   Data Processing Manager   The [employer], [employer]. offers job and dispensing optician. Resources are focused on helping students obtain the skills and experiences that are necessary to compete in today's challenging and tight job market. The non-profit faith-based community action agency offers internship trainings as well as classroom instruction. Classes are tailored to meet the needs of people in the [facility] region. [place], [place] [address], [phone]   Foreclosure prevention/Debt Services  Family Services of the [employer] Credit Counseling Service inludes debt and foreclosure prevention programs for local families. This includes money management, financial advice, budget review and development of a written action plan with a pensions consultant to help solve specific individual financial problems. In addition, housing and mortgage counselors can also provide pre- and post-purchase homeownership counseling, default resolution counseling (to prevent foreclosure) and reverse mortgage counseling. A Debt Management Program allows people and families with a high level of credit card or medical debt to consolidate and repay consumer debt and loans to creditors and rebuild positive credit ratings and scores. Contact (336) D7650557.   Community clinics in [place]  -Health Department [facility] Clinic: [address], [place], [address]. [phone].   -Health Department [place] [facility]: [address], [facility], [address]. [phone].   -[facility] offers medical care through a group of  doctors, pharmacies and other healthcare related agencies that offer services for low income, uninsured adults in [place]. Also offers adult Dental care and assistance with applying for an [employer]. Call [phone].    [facility]. This center provides low-cost health care to those without health insurance. Services offered include an onsite pharmacy. Phone [phone]. [address], [place].   -Medication Assistance Program serves as a link between pharmaceutical companies and patients to provide low cost or free prescription medications. This service is available for residents who meet certain income restrictions and have no insurance coverage. PLEASE CALL [phone] [place]) OR [phone] ([place])   -One Step Further: Materials Engineer, The [employer] Support & Nutrition Program, [place]. Call [phone]/ [phone].   Food pantry and assistance  -[facility]ban Ministry-Food Bank: [address], [place] [address]. Phone [phone]   -[facility] Food Pantry: [address], [place], [place] [address]. [phone].   -Missionary Ministry: has the purpose of visiting the sick and shut-ins and provide for needs in the surrounding communities. Call [phone]. Email: [email or web address] .com This program provides: Food box for seniors, Financial assistance, Food to meet basic nutritional needs.   -Meals on Wheels with Senior Resources: [facility] residents age 55 and over who are homebound and unable to obtain and prepare a nutritious  meal for themselves are eligible for this service. There may be a waiting list in certain parts of [facility] if the route in that area is full. If you are in [facility] and [place] call [phone] to register. For all other areas call [phone] to register.   -Greater Dietitian:  https://findfood.bargaincontractor.si     TRANSPORTATION:  -[employer] Department of Health: Call [facility] and [employer] at [phone] for details. attractionguides.es     -Access [place]: Access [place] is the [employer] Agency's shared-ride transportation service for eligible riders who have a disability that prevents them from riding the fixed route bus. Call [phone]. Access [place] riders must pay a fare of $1.50 per trip, or may purchase a 10-ride punch card for $14.00 ($1.40 per ride) or a 40-ride punch card for $48.00 ($1.20 per ride).     -The Shepherd's WHEELS rideshare transportation service is provided for senior citizens (60+) who live independently within [place] city limits and are unable to drive or have limited access to transportation. Call [phone] to schedule an appointment.     -[place] Transportation: For Medicare or Medicaid recipients call [phone]. Ambulance, wheelchair fleeta, and ambulatory quotes available.      FLEEING VIOLENCE:  -Family Services of the [facility]- 24/7 Crisis line [phone]) -[facility] Centers: (336) [phone]-SAFE [phone])   [place] 2-1-1 is another useful way to locate resources in the community. Visit shedsizes.ch to find service information online. If you need additional assistance, 2-1-1 Referral Specialists are available 24 hours a day, every day by dialing 2-1-1 or [phone] from any phone. The call is free, confidential, and available in any language.   Affordable Housing Search http://www.nchousingsearch.[facility]   M-F 8a-3p [address]  [place], [place] [address] [phone] Services include: laundry, barbering, support groups, case management, phone & computer access, showers, AA/NA mtgs, mental health/substance abuse nurse, job skills  class, disability information, VA assistance, spiritual classes, etc. Winter Shelter available when temperatures are less than 32 degrees.    HOMELESS SHELTERS  [facility] at [facility]- Call [phone] ext. 347 or ext. 336. Located at [address], [place], [place] [address]    [facility] Mens Shelter-Call [phone]. Located at [address] [place] [address].    [facility] [employer]. Call [phone]. Office located at [address] [place] [address].    [facility] Family Housing through Pink [phone].    [facility] Family Shelter- Call [phone]. Located at [address] [place], [place], [place] [address].    Room at the Inn-For Pregnant mothers. Call [phone]. Located at [address] [place], [address].    [place] [facility]-For men in [place]. Call [phone].  [facility]-Shelter in [place]. Call [phone].    Home of [employer] for [employer] [phone]. Office located at [address], [place], [address].    [employer] be agreeable to help with chores. Call [phone] ext. 5000.  Men's: [address] [place], [place] [address]. Women's: [facility]  [address] [place], [place] [address]   Crisis Services  [facility] Mobile Crisis Management- [phone]    [facility] [address], [place], [place] [address]. Phone: [phone]   Increase activity slowly   Complete by: As directed        Signed: Bach Rocchi,  Raymond Bhardwaj, DO 08/16/2024, 10:03 AM

## 2024-08-19 ENCOUNTER — Telehealth: Payer: Self-pay | Admitting: *Deleted

## 2024-08-19 ENCOUNTER — Telehealth: Payer: Self-pay

## 2024-08-19 DIAGNOSIS — Z599 Problem related to housing and economic circumstances, unspecified: Secondary | ICD-10-CM

## 2024-08-19 NOTE — Transitions of Care (Post Inpatient/ED Visit) (Signed)
 08/19/2024  Name: Leroy Simmons MRN: [id number] DOB: 27-Sep-1971  Today's TOC FU Call Status: Today's TOC FU Call Status:: Successful TOC FU Call Completed TOC FU Call Complete Date: 08/19/24  Patient's Name and Date of Birth confirmed. 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 How have you been since you were released from the hospital?: Better (continues to feel shaky and nervous) Any questions or concerns?: No  Items Reviewed: Did you receive and understand the discharge instructions provided?: Yes (Patient received instructions, but has not reviewed.) Medications obtained,verified, and reconciled?: Yes (Medications Reviewed) Any new allergies since your discharge?: No Dietary orders reviewed?: Yes Type of Diet Ordered:: low sodium heart healthy Do you have support at home?: Yes People in Home [RPT]: friend(s) Name of Support/Comfort Primary Source: Redell Morrison/Friend  Medications Reviewed Today: Medications Reviewed Today     Reviewed by Lucky Andrea LABOR, RN (Registered Nurse) on 08/19/24 at 1053  Med List Status: <None>   Medication Order Taking? Sig Documenting Provider Last Dose Status Informant  albuterol  (VENTOLIN  HFA) 108 (90 Base) MCG/ACT inhaler [id number] Yes Inhale 2 puffs into the lungs every 6 (six) hours as needed for wheezing or shortness of breath. Newlin, Enobong, MD  Active Self, Pharmacy Records  apixaban  (ELIQUIS ) 5 MG TABS tablet [id number] Yes Take 1 tablet (5 mg total) by mouth 2 (two) times daily. Newlin, Enobong, MD  Active Self, Pharmacy Records           Med Note JACKOLYN WADDELL DEL   Tue Aug 06, 2024  9:23 AM) Dispense records do not support compliance.   atorvastatin  (LIPITOR ) 80 MG tablet [id number] Yes Take 1  tablet (80 mg total) by mouth daily. Stop the rosuvastatin . Newlin, Enobong, MD  Active Self, Pharmacy Records  blood glucose meter kit and supplies [id number]  Dispense based on patient and insurance preference. Use up to four times daily as directed. (FOR ICD-10 E10.9, E11.9).  Patient not taking: Reported on 08/19/2024   de [place], Quintin PARAS, MD  Active Self, Pharmacy Records  carvedilol  (COREG ) 25 MG tablet [id number] Yes Take 1 tablet (25 mg total) by mouth 2 (two) times daily with a meal. Eben Reyes BROCKS, MD  Active   Continuous Glucose Receiver (FREESTYLE LIBRE 3 READER) DEVI [id number]  Use to check blood sugar continuously.  Patient not taking: Reported on 08/19/2024   Newlin, Enobong, MD  Active Self, Pharmacy Records  Continuous Glucose Sensor (FREESTYLE LIBRE 3 PLUS SENSOR) [place] [id number]  Change sensor every 15 days. Use to check blood sugar continuously.  Patient not taking: Reported on 08/19/2024   Newlin, Enobong, MD  Active Self, Pharmacy Records  dorzolamide -timolol  (COSOPT ) 2-0.5 % ophthalmic solution [id number]  Place 1 drop into both eyes 2 (two) times daily.  Patient not taking: Reported on 08/19/2024     Active Self, Pharmacy Records  FLUoxetine  (PROZAC ) 20 MG capsule [id number]  Take 1 capsule (20 mg total) by mouth daily.  Patient not taking: Reported on 08/19/2024   Newlin, Enobong, MD  Active Self, Pharmacy Records  furosemide  (LASIX ) 40 MG tablet [id number] Yes Take 1 tablet (40 mg total) by mouth daily. Eben Reyes BROCKS, MD  Active   hydrALAZINE  (APRESOLINE ) 10 MG tablet [id number] Yes Take 1 tablet (10 mg total) by mouth 3 (three) times daily. Celestia Rosaline SQUIBB, NP  Active Self, Pharmacy Records  hydrOXYzine  (ATARAX ) 25 MG tablet [id number] Yes Take 1 tablet (25 mg total) by mouth 3 (three) times daily as needed for up to 5 days for itching. Eben Reyes BROCKS, MD  Active   insulin  glargine, 2 Unit Dial , (TOUJEO  MAX SOLOSTAR) 300 UNIT/ML Solostar Pen [id number]  Inject  32 Units into the skin daily.  Patient not taking: Reported on 08/19/2024   Eben Reyes BROCKS, MD  Active   insulin  lispro (HUMALOG ) 100 UNIT/ML KwikPen [id number] Yes Inject 6 Units into the skin 3 (three) times daily before meals. Eben Reyes BROCKS, MD  Active   Insulin  Pen Needle 32G X 4 MM MISC [id number] Yes Use with insulin  Newlin, Enobong, MD  Active Self, Pharmacy Records  latanoprost  (XALATAN ) 0.005 % ophthalmic solution [id number]  Administer 1 drop into the left eye nightly.  Patient not taking: Reported on 08/19/2024     Active Self, Pharmacy Records  lidocaine  (LIDODERM ) 5 % [id number]  Place 1 patch onto the skin daily. Remove & Discard patch within 12 hours or as directed by MD  Patient not taking: Reported on 08/19/2024   Eben Reyes BROCKS, MD  Active   pantoprazole  (PROTONIX ) 40 MG tablet [id number]  Take 1 tablet (40 mg total) by mouth daily.  Patient not taking: Reported on 08/19/2024   Newlin, Enobong, MD  Active Self, Pharmacy Records  Semaglutide,0.25 or 0.5MG /DOS, (OZEMPIC, 0.25 OR 0.5 MG/DOSE,) 2 MG/3ML SOPN [id number]  Inject 0.25 mg into the skin once a week. For 4 weeks. Then, increase to 0.5 mg weekly thereafter.  Patient not taking: Reported on 08/19/2024   Newlin, Enobong, MD  Active Self, Pharmacy Records  sevelamer carbonate (RENVELA) 800 MG tablet [id number] Yes Take 1 tablet (800 mg total) by mouth 3 (three) times daily with meals. Eben Reyes BROCKS, MD  Active             Home Care and Equipment/Supplies: Were Home Health Services Ordered?: No Any new equipment or medical supplies ordered?: No  Functional Questionnaire: Do you need assistance with bathing/showering or dressing?: No Do you need assistance with meal preparation?: No Do you need assistance with eating?: No Do you have difficulty maintaining continence: No Do you need assistance with getting out of bed/getting out of a chair/moving?: No Do you have difficulty managing or taking your  medications?: No  Follow up appointments reviewed: PCP Follow-up appointment confirmed?: No MD Provider Line Number:[phone] Given: No Specialist Hospital Follow-up appointment confirmed?: Yes Date of Specialist follow-up appointment?: 09/12/24 Follow-Up Specialty Provider:: Dr. Janene Do you need transportation to your follow-up appointment?: No Do you understand care options if your condition(s) worsen?: Yes-patient verbalized understanding  SDOH Interventions Today    Flowsheet Row Most Recent Value  SDOH Interventions   Food Insecurity Interventions Other (Comment)  [Patient provided with resources while inpatient. Referral to BSW]  Housing Interventions Other (Comment)  [BSW referral for resources]  Transportation Interventions Intervention Not Indicated  Utilities Interventions Other (Comment)  [BSW referral]    Andrea Dimes RN, BSN [place]  Value-Based Care Institute [facility] RN Care Manager [phone]

## 2024-08-19 NOTE — Transitions of Care (Post Inpatient/ED Visit) (Signed)
   08/19/2024  Name: ALEJANDRO GAMEL MRN: [id number] DOB: Feb 05, 1971  Today's TOC FU Call Status: Today's TOC FU Call Status:: Unsuccessful Call (1st Attempt) Unsuccessful Call (1st Attempt) Date: 08/19/24  Attempted to reach the patient regarding the most recent Inpatient/ED visit.  Follow Up Plan: Additional outreach attempts will be made to reach the patient to complete the Transitions of Care (Post Inpatient/ED visit) call.   Shona Prow RN, CCM [place]  VBCI-Population Health RN Care Manager [phone]

## 2024-08-20 DIAGNOSIS — E113553 Type 2 diabetes mellitus with stable proliferative diabetic retinopathy, bilateral: Secondary | ICD-10-CM | POA: Diagnosis not present

## 2024-08-20 DIAGNOSIS — H35033 Hypertensive retinopathy, bilateral: Secondary | ICD-10-CM | POA: Diagnosis not present

## 2024-08-20 DIAGNOSIS — H3581 Retinal edema: Secondary | ICD-10-CM | POA: Diagnosis not present

## 2024-08-20 DIAGNOSIS — H40053 Ocular hypertension, bilateral: Secondary | ICD-10-CM | POA: Diagnosis not present

## 2024-08-20 DIAGNOSIS — H2513 Age-related nuclear cataract, bilateral: Secondary | ICD-10-CM | POA: Diagnosis not present

## 2024-08-21 ENCOUNTER — Telehealth: Payer: Self-pay | Admitting: Primary Care

## 2024-08-21 ENCOUNTER — Other Ambulatory Visit: Payer: Self-pay

## 2024-08-21 ENCOUNTER — Telehealth: Payer: Self-pay

## 2024-08-21 NOTE — Patient Instructions (Signed)
 Leroy Simmons - I am sorry I was unable to reach you today for our scheduled appointment. I work with Celestia Rosaline SQUIBB, NP and am calling to support your healthcare needs. Please contact me at [phone] at your earliest convenience. I look forward to speaking with you soon.   Thank you,  Thersia Hoar, BSW, MHA [place]  Value Based Care Institute Social Worker, Population Health [phone]

## 2024-08-21 NOTE — Telephone Encounter (Signed)
 LVM to confirm appt 11/20

## 2024-08-22 ENCOUNTER — Encounter: Payer: Self-pay | Admitting: Pharmacist

## 2024-08-22 ENCOUNTER — Ambulatory Visit: Attending: Family Medicine | Admitting: Pharmacist

## 2024-08-22 DIAGNOSIS — E1165 Type 2 diabetes mellitus with hyperglycemia: Secondary | ICD-10-CM | POA: Diagnosis not present

## 2024-08-22 DIAGNOSIS — Z794 Long term (current) use of insulin: Secondary | ICD-10-CM | POA: Diagnosis not present

## 2024-08-22 DIAGNOSIS — Z7985 Long-term (current) use of injectable non-insulin antidiabetic drugs: Secondary | ICD-10-CM

## 2024-08-22 NOTE — Patient Instructions (Signed)
 Thank you for coming to see me today. Please do the following:  Continue Ozempic 0.25 mg once weekly for another 3 doses. Then, increase to 0.5 mg weekly.   Decrease Toujeo  to 48 units in the morning.  Decrease Humalog  to 6 units before meals.  Continue checking blood sugars at home.  Continue making the lifestyle changes we've discussed together during our visit. Diet and exercise play a significant role in improving your blood sugars.  Follow-up with me in 4-6 weeks.    Hypoglycemia or low blood sugar:   Low blood sugar can happen quickly and may become an emergency if not treated right away.   While this shouldn't happen often, it can be brought upon if you skip a meal or do not eat enough. Also, if your insulin  or other diabetes medications are dosed too high, this can cause your blood sugar to go to low.   Warning signs of low blood sugar include: Feeling shaky or dizzy Feeling weak or tired  Excessive hunger Feeling anxious or upset  Sweating even when you aren't exercising  What to do if I experience low blood sugar? Check your blood sugar with your meter. If lower than 70, proceed to step 2.  Treat with 3-4 glucose tablets or 3 packets of regular sugar. If these aren't around, you can try hard candy. Yet another option would be to drink 4 ounces of fruit juice or 6 ounces of REGULAR soda.  Re-check your sugar in 15 minutes. If it is still below 70, do what you did in step 2 again. If has come back up, go ahead and eat a snack or small meal at this time.

## 2024-08-22 NOTE — Progress Notes (Signed)
 S:     No chief complaint on file.  53 y.o. male who presents for diabetes evaluation, education, and management. Patient arrives in good spirits and presents without any assistance.   Patient was referred and last seen by Primary Care Provider, Rosaline Bohr, NP, on 04/25/24. At last visit, patient's A1c was 10.3% (up from 9.3% prior).  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   Last saw pharmacy 07/2024. A1c was down to 8.0%. Since then, he was hospitalized 11/4 -08/16/24 with acute on chronic HFpEF. Of note, A1c at the time of admission was 7.2%.   Today, patient presents to appointment for follow-up. He is doing well but feels sleepy and fatigued. Confirms that he is taking his medications as prescribed. Denies any hypo- or hyperglycemia since admission. He has his Ozempic with him today for teaching.  Family/Social History: Mother: HF Does not smoke or use smokeless tobacco. Does not drink. Does not do drugs  Current diabetes medications include: Toujeo  52 units daily, Humalog  8 units three times a day with meals, Ozempic 0.25 mg weekly Patient reports adherence to taking all medications as prescribed.   Insurance coverage: [employer]  Patient reports symptoms of hypoglycemic events but gives no readings.   Patient reports nocturia (nighttime urination).  Patient reports neuropathy (nerve pain). Patient reports visual changes. Patient reported self foot exams.   O:   Lab Results  Component Value Date   HGBA1C 7.2 (H) 08/06/2024   There were no vitals filed for this visit.  Lipid Panel     Component Value Date/Time    CHOL 210 (H) 07/08/2024 1427   TRIG 207 (H) 07/08/2024 1427   HDL 37 (L) 07/08/2024 1427   CHOLHDL 5.7 (H) 07/08/2024 1427   CHOLHDL 4.6 01/19/2023 0617   VLDL 46 (H) 01/19/2023 0617   LDLCALC 136 (H) 07/08/2024 1427    Clinical Atherosclerotic Cardiovascular Disease (ASCVD): Yes  The ASCVD Risk score (Arnett DK, et al., 2019) failed to calculate for the following reasons:   Risk score cannot be calculated because patient has a medical history suggesting prior/existing ASCVD   Patient is participating in a Managed Medicaid Plan: YES    A/P: Diabetes longstanding currently close to goal with A1c 7.2%. Commended patient for his hard work! He is currently unsymptomatic from a hyper or hypoglycemic standpoint. He is able to verbalize appropriate hypoglycemia management plan. Medication adherence appears to be okay. -DECREASE Toujeo  Max TO 48 units in the morning.  -DECREASE Humalog  TO 6 units TID before meals.  -START Ozempic 0.25 mg weekly.  -Patient was educated on the use of the Ozempic pen. Reviewed necessary supplies and operation of the pen. We gave him his first injection today.  -Patient educated on purpose, proper use, and potential adverse effects of Toujeo , Humalog .  -Extensively discussed pathophysiology of diabetes, recommended lifestyle interventions, dietary effects on blood sugar control.  -Counseled on s/sx of and management of hypoglycemia.  -Next A1c anticipated 10/2024.   Written patient instructions provided. Patient verbalized understanding of treatment plan.  Total time in face to face counseling 45 minutes.    Follow-up:  Pharmacist in 4-6 weeks.   Herlene  Fleeta Morris, PharmD, BCACP, CPP Clinical Pharmacist [facility] [phone]

## 2024-08-23 ENCOUNTER — Ambulatory Visit (INDEPENDENT_AMBULATORY_CARE_PROVIDER_SITE_OTHER): Admitting: Primary Care

## 2024-08-23 ENCOUNTER — Telehealth: Payer: Self-pay

## 2024-08-23 ENCOUNTER — Other Ambulatory Visit: Payer: Self-pay

## 2024-08-23 DIAGNOSIS — E1122 Type 2 diabetes mellitus with diabetic chronic kidney disease: Secondary | ICD-10-CM | POA: Diagnosis not present

## 2024-08-23 DIAGNOSIS — I5030 Unspecified diastolic (congestive) heart failure: Secondary | ICD-10-CM | POA: Diagnosis not present

## 2024-08-23 DIAGNOSIS — N184 Chronic kidney disease, stage 4 (severe): Secondary | ICD-10-CM | POA: Diagnosis not present

## 2024-08-23 DIAGNOSIS — D631 Anemia in chronic kidney disease: Secondary | ICD-10-CM | POA: Diagnosis not present

## 2024-08-23 DIAGNOSIS — I130 Hypertensive heart and chronic kidney disease with heart failure and stage 1 through stage 4 chronic kidney disease, or unspecified chronic kidney disease: Secondary | ICD-10-CM | POA: Diagnosis not present

## 2024-08-23 DIAGNOSIS — N2581 Secondary hyperparathyroidism of renal origin: Secondary | ICD-10-CM | POA: Diagnosis not present

## 2024-08-23 NOTE — Progress Notes (Unsigned)
 Complex Care Management Care Guide Note  08/23/2024 Name: DELORIS MITTAG MRN: [id number] DOB: 1970-12-15  Leroy Simmons is a 53 y.o. year old male who is a primary care patient of Celestia Rosaline SQUIBB, NP and is actively engaged with the care management team. I reached out to Leroy Simmons by phone today to assist with re-scheduling  with the BSW.  Follow up plan: Unsuccessful telephone outreach attempt made. A HIPAA compliant phone message was left for the patient providing contact information and requesting a return call.  Leotis Rase [facility], [facility] Guide  Direct Dial : [phone]  Fax [phone]

## 2024-08-27 ENCOUNTER — Other Ambulatory Visit: Payer: Self-pay

## 2024-08-27 MED ORDER — CALCIUM ACETATE (PHOS BINDER) 667 MG PO TABS
667.0000 mg | ORAL_TABLET | Freq: Every day | ORAL | 4 refills | Status: AC
  Filled 2024-08-27: qty 30, 30d supply, fill #0

## 2024-08-27 NOTE — Progress Notes (Signed)
 Complex Care Management Care Guide Note  08/27/2024 Name: Leroy Simmons MRN: [id number] DOB: April 04, 1971  Leroy Simmons is a 53 y.o. year old male who is a primary care patient of Celestia Rosaline SQUIBB, NP and is actively engaged with the care management team. I reached out to Leroy Simmons by phone today to assist with re-scheduling  with the BSW.  Follow up plan: Unsuccessful telephone outreach attempt made. A HIPAA compliant phone message was left for the patient providing contact information and requesting a return call.  Leotis Rase [facility], [place] Guide  Direct Dial : [phone]  Fax [phone]

## 2024-09-02 ENCOUNTER — Ambulatory Visit: Admitting: Physician Assistant

## 2024-09-02 ENCOUNTER — Encounter: Admitting: Family

## 2024-09-02 NOTE — Progress Notes (Deleted)
 Chief Complaint: Discuss colonoscopy on Eliquis   HPI:    Leroy Simmons is a 53 year old male with a past medical history as listed below including CHF, CAD on Eliquis  and depression, who was referred to me by Celestia Rosaline SQUIBB, NP for discussion of a colonoscopy on Eliquis .    01/09/24 CTAP without contrast with mild bibasilar opacities in the lung bases concerning for pneumonia small gallstone.    08/06/24 echo with LVEF 60-65%    08/12/2024 patient underwent cardiac cath with mildly elevated PCWP, mild pulmonary venous hypertension.    08/15/2024 CBC with hemoglobin 11.9, MCV normal.  Renal function panel potassium low at 3.4, glucose 219, BUN 104, creatinine 5.55.    08/22/2024 patient followed with his PCP.  At that time discussed starting Ozempic .   Past Medical History:  Diagnosis Date   Acute gangrenous appendicitis 09/25/2022   Anxiety    CHF (congestive heart failure) (HCC)    Coronary artery disease    Depression    Diabetes mellitus without complication (HCC)    Hypertension     Past Surgical History:  Procedure Laterality Date   INCISION AND DRAINAGE ABSCESS N/A 09/15/2023   Procedure: INCISION AND DRAINAGE OF BACK ABSCESS;  Surgeon: Belinda Cough, MD;  Location: [facility] OR;  Service: General;  Laterality: N/A;   LAPAROSCOPIC APPENDECTOMY N/A 09/26/2022   Procedure: APPENDECTOMY LAPAROSCOPIC;  Surgeon: Lane Shope, MD;  Location: [facility] ORS;  Service: General;  Laterality: N/A;   LEFT HEART CATH AND CORONARY ANGIOGRAPHY N/A 03/21/2022   Procedure: LEFT HEART CATH AND CORONARY ANGIOGRAPHY;  Surgeon: Dann Candyce RAMAN, MD;  Location: [facility] INVASIVE CV LAB;  Service: Cardiovascular;  Laterality: N/A;   RIGHT HEART CATH N/A 08/13/2024   Procedure: RIGHT HEART CATH;  Surgeon: Rolan Ezra RAMAN, MD;  Location: [facility] INVASIVE CV LAB;  Service: Cardiovascular;  Laterality: N/A;    Current Outpatient Medications  Medication Sig Dispense Refill   albuterol  (VENTOLIN  HFA) 108 (90 Base)  MCG/ACT inhaler Inhale 2 puffs into the lungs every 6 (six) hours as needed for wheezing or shortness of breath. 18 g 2   apixaban  (ELIQUIS ) 5 MG TABS tablet Take 1 tablet (5 mg total) by mouth 2 (two) times daily. 180 tablet 1   atorvastatin  (LIPITOR ) 80 MG tablet Take 1 tablet (80 mg total) by mouth daily. Stop the rosuvastatin . 90 tablet 3   blood glucose meter kit and supplies Dispense based on patient and insurance preference. Use up to four times daily as directed. (FOR ICD-10 E10.9, E11.9). (Patient not taking: Reported on 08/19/2024) 1 each 0   calcium  acetate (PHOSLO ) 667 MG tablet Take 1 tablet (667 mg total) by mouth daily with meals. 30 tablet 4   carvedilol  (COREG ) 25 MG tablet Take 1 tablet (25 mg total) by mouth 2 (two) times daily with a meal. 60 tablet 0   Continuous Glucose Receiver (FREESTYLE LIBRE 3 READER) DEVI Use to check blood sugar continuously. (Patient not taking: Reported on 08/19/2024) 1 each 0   Continuous Glucose Sensor (FREESTYLE LIBRE 3 PLUS SENSOR) MISC Change sensor every 15 days. Use to check blood sugar continuously. (Patient not taking: Reported on 08/19/2024) 2 each 6   dorzolamide -timolol  (COSOPT ) 2-0.5 % ophthalmic solution Place 1 drop into both eyes 2 (two) times daily. (Patient not taking: Reported on 08/19/2024) 10 mL 11   FLUoxetine  (PROZAC ) 20 MG capsule Take 1 capsule (20 mg total) by mouth daily. (Patient not taking: Reported on 08/19/2024) 90 capsule 3   furosemide  (  LASIX ) 40 MG tablet Take 1 tablet (40 mg total) by mouth daily. 30 tablet 0   hydrALAZINE  (APRESOLINE ) 10 MG tablet Take 1 tablet (10 mg total) by mouth 3 (three) times daily. 90 tablet 0   insulin  glargine, 2 Unit Dial , (TOUJEO  MAX SOLOSTAR) 300 UNIT/ML Solostar Pen Inject 32 Units into the skin daily. (Patient not taking: Reported on 08/19/2024) 9 mL 2   insulin  lispro (HUMALOG ) 100 UNIT/ML KwikPen Inject 6 Units into the skin 3 (three) times daily before meals. 15 mL 2   Insulin  Pen  Needle 32G X 4 MM MISC Use with insulin  100 each 3   latanoprost  (XALATAN ) 0.005 % ophthalmic solution Administer 1 drop into the left eye nightly. (Patient not taking: Reported on 08/19/2024) 2.5 mL 7   lidocaine  (LIDODERM ) 5 % Place 1 patch onto the skin daily. Remove & Discard patch within 12 hours or as directed by MD (Patient not taking: Reported on 08/19/2024) 30 patch 0   pantoprazole  (PROTONIX ) 40 MG tablet Take 1 tablet (40 mg total) by mouth daily. (Patient not taking: Reported on 08/19/2024) 90 tablet 1   Semaglutide ,0.25 or 0.5MG /DOS, (OZEMPIC , 0.25 OR 0.5 MG/DOSE,) 2 MG/3ML SOPN Inject 0.25 mg into the skin once a week. For 4 weeks. Then, increase to 0.5 mg weekly thereafter. (Patient not taking: Reported on 08/19/2024) 3 mL 1   sevelamer  carbonate (RENVELA ) 800 MG tablet Take 1 tablet (800 mg total) by mouth 3 (three) times daily with meals. 90 tablet 0   No current facility-administered medications for this visit.    Allergies as of 09/02/2024   (No Known Allergies)    Family History  Problem Relation Age of Onset   Heart failure Mother     Social History   Socioeconomic History   Marital status: Single    Spouse name: Not on file   Number of children: 0   Years of education: Not on file   Highest education level: High school graduate  Occupational History   Occupation: retail  Tobacco Use   Smoking status: Never   Smokeless tobacco: Never  Vaping Use   Vaping status: Never Used  Substance and Sexual Activity   Alcohol use: Yes    Comment: rarely   Drug use: Never   Sexual activity: Not on file  Other Topics Concern   Not on file  Social History Narrative   Not on file   Social Drivers of Health   Financial Resource Strain: Low Risk  (01/20/2023)   Overall Financial Resource Strain (CARDIA)    Difficulty of Paying Living Expenses: Not very hard  Food Insecurity: Food Insecurity Present (08/19/2024)   Hunger Vital Sign    Worried About Running Out of  Food in the Last Year: Sometimes true    Ran Out of Food in the Last Year: Sometimes true  Transportation Needs: No Transportation Needs (08/19/2024)   PRAPARE - Administrator, Civil Service (Medical): No    Lack of Transportation (Non-Medical): No  Physical Activity: Not on file  Stress: Not on file  Social Connections: Socially Isolated (08/06/2024)   Social Connection and Isolation Panel    Frequency of Communication with Friends and Family: More than three times a week    Frequency of Social Gatherings with Friends and Family: More than three times a week    Attends Religious Services: Never    Database Administrator or Organizations: No    Attends Banker Meetings: Never  Marital Status: Never married  Intimate Partner Violence: Not At Risk (08/19/2024)   Humiliation, Afraid, Rape, and Kick questionnaire    Fear of Current or Ex-Partner: No    Emotionally Abused: No    Physically Abused: No    Sexually Abused: No    Review of Systems:    Constitutional: No weight loss, fever, chills, weakness or fatigue HEENT: Eyes: No change in vision               Ears, Nose, Throat:  No change in hearing or congestion Skin: No rash or itching Cardiovascular: No chest pain, chest pressure or palpitations   Respiratory: No SOB or cough Gastrointestinal: See HPI and otherwise negative Genitourinary: No dysuria or change in urinary frequency Neurological: No headache, dizziness or syncope Musculoskeletal: No new muscle or joint pain Hematologic: No bleeding or bruising Psychiatric: No history of depression or anxiety    Physical Exam:  Vital signs: There were no vitals taken for this visit.  Constitutional:   Pleasant Caucasian male appears to be in NAD, Well developed, Well nourished, alert and cooperative Head:  Normocephalic and atraumatic. Eyes:   PEERL, EOMI. No icterus. Conjunctiva pink. Ears:  Normal auditory acuity. Neck:  Supple Throat: Oral  cavity and pharynx without inflammation, swelling or lesion.  Respiratory: Respirations even and unlabored. Lungs clear to auscultation bilaterally.   No wheezes, crackles, or rhonchi.  Cardiovascular: Normal S1, S2. No MRG. Regular rate and rhythm. No peripheral edema, cyanosis or pallor.  Gastrointestinal:  Soft, nondistended, nontender. No rebound or guarding. Normal bowel sounds. No appreciable masses or hepatomegaly. Rectal:  Not performed.  Msk:  Symmetrical without gross deformities. Without edema, no deformity or joint abnormality.  Neurologic:  Alert and  oriented x4;  grossly normal neurologically.  Skin:   Dry and intact without significant lesions or rashes. Psychiatric: Oriented to person, place and time. Demonstrates good judgement and reason without abnormal affect or behaviors.  RELEVANT LABS AND IMAGING: CBC    Component Value Date/Time   WBC 8.5 08/15/2024 0259   RBC 3.89 (L) 08/15/2024 0259   HGB 11.9 (L) 08/15/2024 0259   HGB 13.2 04/25/2024 1607   HCT 34.9 (L) 08/15/2024 0259   HCT 41.3 04/25/2024 1607   PLT 313 08/15/2024 0259   PLT 259 04/25/2024 1607   MCV 89.7 08/15/2024 0259   MCV 94 04/25/2024 1607   MCH 30.6 08/15/2024 0259   MCHC 34.1 08/15/2024 0259   RDW 11.8 08/15/2024 0259   RDW 13.3 04/25/2024 1607   LYMPHSABS 2.1 04/25/2024 1607   MONOABS 0.8 01/09/2024 0534   EOSABS 0.2 04/25/2024 1607   BASOSABS 0.0 04/25/2024 1607    CMP     Component Value Date/Time   NA 136 08/16/2024 0219   NA 142 07/08/2024 1427   K 3.7 08/16/2024 0219   CL 103 08/16/2024 0219   CO2 20 (L) 08/16/2024 0219   GLUCOSE 191 (H) 08/16/2024 0219   BUN 106 (H) 08/16/2024 0219   BUN 40 (H) 07/08/2024 1427   CREATININE 5.61 (H) 08/16/2024 0219   CALCIUM  8.3 (L) 08/16/2024 0219   PROT 6.5 08/06/2024 0739   PROT 6.4 07/08/2024 1427   ALBUMIN  2.7 (L) 08/16/2024 0219   ALBUMIN  3.7 (L) 07/08/2024 1427   AST 22 08/06/2024 0739   ALT 26 08/06/2024 0739   ALKPHOS 133 (H)  08/06/2024 0739   BILITOT 0.8 08/06/2024 0739   BILITOT 0.5 07/08/2024 1427   GFRNONAA 12 (L) 08/16/2024 9780  GFRAA  10/26/2010 1143    >60        The eGFR has been calculated using the MDRD equation. This calculation has not been validated in all clinical situations. eGFR's persistently <60 mL/min signify possible Chronic Kidney Disease.    Assessment: 1.  Screening for colorectal cancer:  Plan: 1.  Hold Ozempic  7 days 2.  Hold Eliquis  2 days     Delon Failing, PA-C [facility] 09/02/2024, 9:39 AM  Cc: Celestia Rosaline SQUIBB, NP

## 2024-09-02 NOTE — Progress Notes (Signed)
 Erroneous encounter-disregard

## 2024-09-11 NOTE — Progress Notes (Deleted)
°  Cardiology Office Note   Date:  09/11/2024  ID:  Leroy Simmons, DOB 12/08/1970, MRN [id number] PCP: Leroy Rosaline SQUIBB, NP  [facility] Providers Cardiologist:  Leroy DELENA Merck, MD { Click to update primary MD,subspecialty MD or APP then REFRESH:1}    History of Present Illness Leroy Simmons is a 53 y.o. male with history of HFpEF, non- obstructive CAD by LHC in 2023, DVT in 2024, PE 01/2024, COPD, CKD stage 4, HTN, homelessness, T2DM, and obesity.     He has had multiple admissions related to his HFpEF. He was most recently in the hospital 08/06/24 with worsening SOB over one week, CP and URI. He attributed the way he felt to his other HF exacerbations and was noted to have been off his torsemide  for the 40 hrs prior and intermittently taking is Eliquis  for about 6 months. RHC 08/12/24 results; normal RA pressure and CO, mild pulmonary HTN, and mildly elevated PCWP of 17. Exacerbation was in the setting of medication non adherence and worsening kidney disease. Upon discharge patient was down around 13 pounds and right heart numbers stable; and CKD was noted to be main culprit of admission. Creatine plateaued around 5.6 in hospital. He does experience homelessness and it has been the contributor to decrease medication adherence.     He is here today for hospital follow up for HFpEF exacerbation.   HFpEF- Echo 08/06/24 LVEF 60-65%, no RWMA, and no structural abnormalities. Today he feels *** GDMT limited due to kidney diease. Continue   ROS: All systems negative unless otherwise indicated in HPI.   Studies Reviewed      *** Risk Assessment/Calculations {Does this patient have ATRIAL FIBRILLATION?:[phone]} No BP recorded.  {Refresh Note OR Click here to enter BP  :1}***       Physical Exam VS:  There were no vitals taken for this visit.       Wt Readings from Last 3 Encounters:  08/16/24 233 lb 7.5 oz (105.9 kg)  06/18/24 245 lb (111.1 kg)  04/25/24 250 lb 9.6 oz  (113.7 kg)    GEN: Well nourished, well developed in no acute distress NECK: No JVD; No carotid bruits CARDIAC: ***RRR, no murmurs, rubs, gallops RESPIRATORY:  Clear to auscultation without rales, wheezing or rhonchi  ABDOMEN: Soft, non-tender, non-distended EXTREMITIES:  No edema; No deformity   ASSESSMENT AND PLAN ***    {Are you ordering a CV Procedure (e.g. stress test, cath, DCCV, TEE, etc)?   Press F2        :[phone]}  Dispo: ***  Signed, Leroy KATHEE Pizza, FNP

## 2024-09-12 ENCOUNTER — Ambulatory Visit: Attending: Physician Assistant | Admitting: Physician Assistant

## 2024-09-17 ENCOUNTER — Telehealth: Payer: Self-pay | Admitting: Internal Medicine

## 2024-09-17 NOTE — Telephone Encounter (Signed)
 [facility] requesting Medical records. Please advise. Will fax over forms.

## 2024-09-30 ENCOUNTER — Ambulatory Visit: Admitting: Pharmacist

## 2024-09-30 ENCOUNTER — Telehealth: Payer: Self-pay | Admitting: Primary Care

## 2024-09-30 NOTE — Telephone Encounter (Signed)
 Copied from CRM [id number]. Topic: General - Other >> Sep 30, 2024 10:52 AM Revonda D wrote:  Reason for CRM: Pt stated that he recently moved and wants to know if Rosaline Bohr, NP, has a provider in [place] that she recommends and would like a callback with an update.

## 2024-09-30 NOTE — Telephone Encounter (Signed)
 Returned pt call and lvm making pt aware that he can reach out to his insurance and see what provider in [place] in is network and if he has any questions or concerns to give us  a call

## 2024-11-08 ENCOUNTER — Telehealth: Payer: Self-pay

## 2024-11-08 DIAGNOSIS — F419 Anxiety disorder, unspecified: Secondary | ICD-10-CM

## 2024-11-08 NOTE — Progress Notes (Unsigned)
 Complex Care Management Note Care Guide Note  11/08/2024 Name: Leroy Simmons MRN: [id number] DOB: May 12, 1971   Complex Care Management Outreach Attempts: An unsuccessful telephone outreach was attempted today to offer the patient information about available complex care management services.  Follow Up Plan:  Additional outreach attempts will be made to offer the patient complex care management information and services.   Encounter Outcome:  No Answer  Debbe Fuse [facility], [facility] Guide  Direct Dial : [phone]  Fax [phone]
